# Patient Record
Sex: Male | Born: 1950 | Race: White | Hispanic: No | Marital: Married | Smoking: Former smoker
Health system: Southern US, Community
[De-identification: ages and names within clinical notes are randomized; demographics above are authoritative.]

## PROBLEM LIST (undated history)

## (undated) DIAGNOSIS — K224 Dyskinesia of esophagus: Secondary | ICD-10-CM

## (undated) DIAGNOSIS — N200 Calculus of kidney: Secondary | ICD-10-CM

## (undated) DIAGNOSIS — N400 Enlarged prostate without lower urinary tract symptoms: Secondary | ICD-10-CM

## (undated) DIAGNOSIS — F17200 Nicotine dependence, unspecified, uncomplicated: Secondary | ICD-10-CM

## (undated) DIAGNOSIS — R0989 Other specified symptoms and signs involving the circulatory and respiratory systems: Secondary | ICD-10-CM

## (undated) DIAGNOSIS — Z87442 Personal history of urinary calculi: Secondary | ICD-10-CM

## (undated) DIAGNOSIS — R200 Anesthesia of skin: Secondary | ICD-10-CM

## (undated) DIAGNOSIS — R202 Paresthesia of skin: Secondary | ICD-10-CM

## (undated) DIAGNOSIS — E119 Type 2 diabetes mellitus without complications: Secondary | ICD-10-CM

## (undated) DIAGNOSIS — E7800 Pure hypercholesterolemia, unspecified: Secondary | ICD-10-CM

## (undated) DIAGNOSIS — D649 Anemia, unspecified: Secondary | ICD-10-CM

## (undated) DIAGNOSIS — I10 Essential (primary) hypertension: Secondary | ICD-10-CM

## (undated) DIAGNOSIS — R011 Cardiac murmur, unspecified: Secondary | ICD-10-CM

## (undated) DIAGNOSIS — Z9889 Other specified postprocedural states: Secondary | ICD-10-CM

## (undated) DIAGNOSIS — E785 Hyperlipidemia, unspecified: Secondary | ICD-10-CM

## (undated) DIAGNOSIS — M5126 Other intervertebral disc displacement, lumbar region: Secondary | ICD-10-CM

## (undated) DIAGNOSIS — I350 Nonrheumatic aortic (valve) stenosis: Secondary | ICD-10-CM

## (undated) DIAGNOSIS — N209 Urinary calculus, unspecified: Secondary | ICD-10-CM

## (undated) DIAGNOSIS — R1310 Dysphagia, unspecified: Secondary | ICD-10-CM

## (undated) DIAGNOSIS — I739 Peripheral vascular disease, unspecified: Secondary | ICD-10-CM

## (undated) DIAGNOSIS — C4491 Basal cell carcinoma of skin, unspecified: Secondary | ICD-10-CM

## (undated) DIAGNOSIS — Z8719 Personal history of other diseases of the digestive system: Secondary | ICD-10-CM

## (undated) DIAGNOSIS — I639 Cerebral infarction, unspecified: Secondary | ICD-10-CM

## (undated) DIAGNOSIS — M1990 Unspecified osteoarthritis, unspecified site: Secondary | ICD-10-CM

## (undated) DIAGNOSIS — R42 Dizziness and giddiness: Secondary | ICD-10-CM

## (undated) DIAGNOSIS — K219 Gastro-esophageal reflux disease without esophagitis: Secondary | ICD-10-CM

## (undated) HISTORY — DX: Calculus of kidney: N20.0

## (undated) HISTORY — DX: Nicotine dependence, unspecified, uncomplicated: F17.200

## (undated) HISTORY — DX: Dysphagia, unspecified: R13.10

## (undated) HISTORY — DX: Essential (primary) hypertension: I10

## (undated) HISTORY — DX: Benign prostatic hyperplasia without lower urinary tract symptoms: N40.0

## (undated) HISTORY — DX: Dizziness and giddiness: R42

## (undated) HISTORY — DX: Urinary calculus, unspecified: N20.9

## (undated) HISTORY — DX: Other specified postprocedural states: Z98.890

## (undated) HISTORY — DX: Other specified symptoms and signs involving the circulatory and respiratory systems: R09.89

## (undated) HISTORY — DX: Anemia, unspecified: D64.9

## (undated) HISTORY — DX: Anesthesia of skin: R20.0

## (undated) HISTORY — DX: Cardiac murmur, unspecified: R01.1

## (undated) HISTORY — DX: Type 2 diabetes mellitus without complications: E11.9

## (undated) HISTORY — DX: Dyskinesia of esophagus: K22.4

## (undated) HISTORY — PX: OTHER SURGICAL HISTORY: SHX169

## (undated) HISTORY — PX: CATARACT EXTRACTION W/ INTRAOCULAR LENS  IMPLANT, BILATERAL: SHX1307

## (undated) HISTORY — DX: Pure hypercholesterolemia, unspecified: E78.00

## (undated) HISTORY — PX: UMBILICAL HERNIA REPAIR: SUR1181

## (undated) HISTORY — DX: Paresthesia of skin: R20.2

## (undated) HISTORY — DX: Basal cell carcinoma of skin, unspecified: C44.91

## (undated) HISTORY — PX: ROTATOR CUFF REPAIR: SHX139

## (undated) HISTORY — PX: CYSTOSCOPY: SUR368

## (undated) HISTORY — DX: Hyperlipidemia, unspecified: E78.5

## (undated) HISTORY — PX: BACK SURGERY: SHX140

## (undated) HISTORY — DX: Nonrheumatic aortic (valve) stenosis: I35.0

---

## 1984-05-28 HISTORY — PX: NECK SURGERY: SHX720

## 1999-06-21 ENCOUNTER — Encounter: Payer: Self-pay | Admitting: Family Medicine

## 1999-06-21 ENCOUNTER — Encounter: Admission: RE | Admit: 1999-06-21 | Discharge: 1999-06-21 | Payer: Self-pay | Admitting: Family Medicine

## 2000-05-28 HISTORY — PX: FINGER SURGERY: SHX640

## 2001-05-11 ENCOUNTER — Encounter: Payer: Self-pay | Admitting: Emergency Medicine

## 2001-05-11 ENCOUNTER — Observation Stay (HOSPITAL_COMMUNITY): Admission: EM | Admit: 2001-05-11 | Discharge: 2001-05-12 | Payer: Self-pay | Admitting: Emergency Medicine

## 2001-05-11 ENCOUNTER — Encounter: Payer: Self-pay | Admitting: *Deleted

## 2001-08-22 ENCOUNTER — Ambulatory Visit (HOSPITAL_BASED_OUTPATIENT_CLINIC_OR_DEPARTMENT_OTHER): Admission: RE | Admit: 2001-08-22 | Discharge: 2001-08-22 | Payer: Self-pay | Admitting: *Deleted

## 2001-10-16 ENCOUNTER — Encounter: Payer: Self-pay | Admitting: Family Medicine

## 2001-10-16 ENCOUNTER — Encounter: Admission: RE | Admit: 2001-10-16 | Discharge: 2001-10-16 | Payer: Self-pay | Admitting: Family Medicine

## 2004-12-05 ENCOUNTER — Encounter: Admission: RE | Admit: 2004-12-05 | Discharge: 2004-12-05 | Payer: Self-pay | Admitting: General Surgery

## 2004-12-13 ENCOUNTER — Ambulatory Visit (HOSPITAL_COMMUNITY): Admission: RE | Admit: 2004-12-13 | Discharge: 2004-12-13 | Payer: Self-pay | Admitting: General Surgery

## 2004-12-13 ENCOUNTER — Ambulatory Visit (HOSPITAL_BASED_OUTPATIENT_CLINIC_OR_DEPARTMENT_OTHER): Admission: RE | Admit: 2004-12-13 | Discharge: 2004-12-13 | Payer: Self-pay | Admitting: General Surgery

## 2005-06-14 ENCOUNTER — Encounter: Admission: RE | Admit: 2005-06-14 | Discharge: 2005-06-14 | Payer: Self-pay | Admitting: Family Medicine

## 2006-01-07 ENCOUNTER — Emergency Department (HOSPITAL_COMMUNITY): Admission: EM | Admit: 2006-01-07 | Discharge: 2006-01-07 | Payer: Self-pay | Admitting: Emergency Medicine

## 2007-05-29 DIAGNOSIS — Z9889 Other specified postprocedural states: Secondary | ICD-10-CM

## 2007-05-29 HISTORY — DX: Other specified postprocedural states: Z98.890

## 2008-04-07 ENCOUNTER — Ambulatory Visit (HOSPITAL_COMMUNITY): Admission: RE | Admit: 2008-04-07 | Discharge: 2008-04-07 | Payer: Self-pay | Admitting: Family Medicine

## 2009-10-18 ENCOUNTER — Encounter: Payer: Self-pay | Admitting: Endocrinology

## 2009-11-30 ENCOUNTER — Ambulatory Visit: Payer: Self-pay | Admitting: Endocrinology

## 2009-11-30 DIAGNOSIS — N209 Urinary calculus, unspecified: Secondary | ICD-10-CM

## 2009-11-30 DIAGNOSIS — R0989 Other specified symptoms and signs involving the circulatory and respiratory systems: Secondary | ICD-10-CM | POA: Insufficient documentation

## 2009-11-30 DIAGNOSIS — E119 Type 2 diabetes mellitus without complications: Secondary | ICD-10-CM

## 2009-11-30 DIAGNOSIS — E7800 Pure hypercholesterolemia, unspecified: Secondary | ICD-10-CM | POA: Insufficient documentation

## 2009-11-30 DIAGNOSIS — F17200 Nicotine dependence, unspecified, uncomplicated: Secondary | ICD-10-CM

## 2009-11-30 DIAGNOSIS — I10 Essential (primary) hypertension: Secondary | ICD-10-CM | POA: Insufficient documentation

## 2009-11-30 HISTORY — DX: Other specified symptoms and signs involving the circulatory and respiratory systems: R09.89

## 2009-11-30 HISTORY — DX: Nicotine dependence, unspecified, uncomplicated: F17.200

## 2009-11-30 HISTORY — DX: Urinary calculus, unspecified: N20.9

## 2009-11-30 HISTORY — DX: Pure hypercholesterolemia, unspecified: E78.00

## 2009-11-30 HISTORY — DX: Type 2 diabetes mellitus without complications: E11.9

## 2009-11-30 HISTORY — DX: Essential (primary) hypertension: I10

## 2009-12-16 ENCOUNTER — Ambulatory Visit: Payer: Self-pay | Admitting: Endocrinology

## 2009-12-16 LAB — CONVERTED CEMR LAB: Fructosamine: 331 micromoles/L — ABNORMAL HIGH (ref <285)

## 2010-03-17 ENCOUNTER — Ambulatory Visit: Payer: Self-pay | Admitting: Endocrinology

## 2010-05-28 DIAGNOSIS — I639 Cerebral infarction, unspecified: Secondary | ICD-10-CM

## 2010-05-28 HISTORY — DX: Cerebral infarction, unspecified: I63.9

## 2010-06-16 ENCOUNTER — Ambulatory Visit: Admit: 2010-06-16 | Payer: Self-pay | Admitting: Endocrinology

## 2010-06-27 NOTE — Assessment & Plan Note (Signed)
Summary: 1-2 week follow up-lb   Vital Signs:  Patient profile:   60 year old male Height:      60 inches (152.40 cm) Weight:      167.25 pounds (76.02 kg) BMI:     32.78 O2 Sat:      97 % on Room air Temp:     97.8 degrees F (36.56 degrees C) oral Pulse rate:   78 / minute BP sitting:   130 / 78  (left arm) Cuff size:   regular  Vitals Entered By: Brenton Grills MA (December 16, 2009 8:40 AM)  O2 Flow:  Room air CC: 2 wk F/U/aj Is Patient Diabetic? Yes   CC:  2 wk F/U/aj.  History of Present Illness: he was able to increase the victoza to the maximum amount, although he has some nausea.  he has mild hypoglycemia in the afternoon, if he misses lunch.  he says cbg is higherst in am (mid-100's).  he brings a record of his cbg's which i have reviewed today.  all are in the low to mid-100's.  Current Medications (verified): 1)  Hydrochlorothiazide 25 Mg Tabs (Hydrochlorothiazide) .[name].. 1 Tablet Once Daily 2)  Starlix 120 Mg Tabs (Nateglinide) .[name].. 1 Tablet After Meals 3)  Vytorin 10-80 Mg Tabs (Ezetimibe-Simvastatin) .[name].. 1 Tablet Once Daily 4)  Actos 45 Mg Tabs (Pioglitazone Hcl) .[name].. 1 Tablet Once Daily 5)  Lotrel 10-20 Mg Caps (Amlodipine Besy-Benazepril Hcl) .[name].. 1 Capsule Once Daily 6)  Metformin Hcl 500 Mg Tabs (Metformin Hcl) .... 2 Tablets Two Times A Day 7)  Aspirin 81 Mg Tbec (Aspirin) .[name].. 1 Tablet Once Daily 8)  Victoza 18 Mg/14ml Soln (Liraglutide) .... 1.8 Mg Each Am  Allergies (verified): No Known Drug Allergies  Past History:  Past Medical History: Last updated: 11/30/2009 CAROTID BRUIT, RIGHT (ICD-785.9) SMOKER (ICD-305.1) HYPERCHOLESTEROLEMIA (ICD-272.0) HYPERTENSION (ICD-401.9) URINARY CALCULUS (ICD-592.9) DM (ICD-250.00)  Review of Systems  The patient denies syncope.    Physical Exam  General:  normal appearance.   Skin:  injection sites at anterior abdomen are normal  Additional Exam:  Fructosamine    [H]  331 umol/L  (converts to a1c of  7.2)   Impression & Recommendations:  Problem # 1:  DM (ICD-250.00) Assessment Improved  Other Orders: T-Fructosamine (16109-60454) Est. Patient Level III (09811)  Patient Instructions: 1)  blood tests are being ordered for you today.  please call [phone] to hear your test results. 2)  pending the test results, please continue the same medications for now 3)  check your blood sugar 2 times a day.  vary the time of day when you check, between before the 3 meals, and at bedtime.  also check if you have symptoms of your blood sugar being too high or too low.  please keep a record of the readings and bring it to your next appointment here.  please call us sooner if you are having low blood sugar episodes. 4)  Please schedule a follow-up appointment in 3 months. 5)  (update: i left message on phone-tree:  rx as we discussed)   Preventive Care Screening  Colonoscopy:    Date:  05/28/2006    Results:  normal

## 2010-06-27 NOTE — Assessment & Plan Note (Signed)
Summary: 3 MTH FU---STC   Vital Signs:  Patient profile:   60 year old male Height:      60 inches (152.40 cm) Weight:      177.25 pounds (80.57 kg) BMI:     34.74 O2 Sat:      98 % on Room air Temp:     98.4 degrees F (36.89 degrees C) oral Pulse rate:   75 / minute BP sitting:   118 / 72  (left arm) Cuff size:   regular  Vitals Entered By: Brenton Grills MA (March 17, 2010 8:08 AM)  O2 Flow:  Room air CC: 3 month F/U/aj Is Patient Diabetic? Yes   CC:  3 month F/U/aj.  History of Present Illness: pt says he could not tolerate victoza, even at the lowest dosage, due to nausea.  no cbg record, but states cbg's have increased to high-100's.  Current Medications (verified): 1)  Hydrochlorothiazide 25 Mg Tabs (Hydrochlorothiazide) .[name].. 1 Tablet Once Daily 2)  Starlix 120 Mg Tabs (Nateglinide) .[name].. 1 Tablet After Meals 3)  Vytorin 10-80 Mg Tabs (Ezetimibe-Simvastatin) .[name].. 1 Tablet Once Daily 4)  Actos 45 Mg Tabs (Pioglitazone Hcl) .[name].. 1 Tablet Once Daily 5)  Lotrel 10-20 Mg Caps (Amlodipine Besy-Benazepril Hcl) .[name].. 1 Capsule Once Daily 6)  Metformin Hcl 500 Mg Tabs (Metformin Hcl) .... 2 Tablets Two Times A Day 7)  Aspirin 81 Mg Tbec (Aspirin) .[name].. 1 Tablet Once Daily 8)  Victoza 18 Mg/87ml Soln (Liraglutide) .... 1.8 Mg Each Am  Allergies (verified): 1)  ! Victoza (Liraglutide)  Past History:  Past Medical History: Last updated: 11/30/2009 CAROTID BRUIT, RIGHT (ICD-785.9) SMOKER (ICD-305.1) HYPERCHOLESTEROLEMIA (ICD-272.0) HYPERTENSION (ICD-401.9) URINARY CALCULUS (ICD-592.9) DM (ICD-250.00)  Review of Systems  The patient denies hypoglycemia.    Physical Exam  General:  normal appearance.   Psych:  Alert and cooperative; normal mood and affect; normal attention span and concentration.     Impression & Recommendations:  Problem # 1:  DM (ICD-250.00) needs increased rx  Medications Added to Medication List This Visit: 1)  Januvia 100 Mg Tabs  (Sitagliptin phosphate) .[name].. 1 tab each am 2)  Glimepiride 4 Mg Tabs (Glimepiride) .[name].. 1 tab two times a day  Other Orders: Admin 1st Vaccine (84696) Flu Vaccine 38yrs + (29528) TLB-A1C / Hgb A1C (Glycohemoglobin) (83036-A1C) Est. Patient Level III (41324)  Patient Instructions: 1)  blood tests are being ordered for you today.  please call [phone] to hear your test results. 2)  if the blood test is high, i would advise changing the starlix to prandin, or adding [place].   3)  Please schedule a follow-up appointment in 3 months. 4)  (update: i left message on phone-tree:  add januvia 100 mg each am, and change starlix to amaryl 4 mg two times a day). Prescriptions: JANUVIA 100 MG TABS (SITAGLIPTIN PHOSPHATE) 1 tab each am  #90 x 3   Entered and Authorized by:   Minus Breeding MD   Signed by:   Minus Breeding MD on 03/17/2010   Method used:   Faxed to ...       [facility] (mail-order)             , [place]         Ph: [phone]       Fax: [phone]   RxID:   [id number] GLIMEPIRIDE 4 MG TABS (GLIMEPIRIDE) 1 tab two times a day  #180 x 3   Entered and Authorized by:  Minus Breeding MD   Signed by:   Minus Breeding MD on 03/17/2010   Method used:   Faxed to ...       [facility] (mail-order)             , [place]         Ph: [phone]       Fax: [phone]   RxID:   [id number] JANUVIA 100 MG TABS (SITAGLIPTIN PHOSPHATE) 1 tab each am  #30 x 11   Entered and Authorized by:   Minus Breeding MD   Signed by:   Minus Breeding MD on 03/17/2010   Method used:   Faxed to ...       [facility] (mail-order)             , [place]         Ph: [phone]       Fax: [phone]   RxID:   [id number]    Orders Added: 1)  Admin 1st Vaccine [90471] 2)  Flu Vaccine 27yrs + [90658] 3)  TLB-A1C / Hgb A1C (Glycohemoglobin) [83036-A1C] 4)  Est. Patient Level III [75643]        Flu Vaccine Consent Questions     Do you have a history of severe allergic reactions to this vaccine? no     Any prior history of allergic reactions to egg and/or gelatin? no    Do you have a sensitivity to the preservative Thimersol? no    Do you have a past history of Guillan-Barre Syndrome? no    Do you currently have an acute febrile illness? no    Have you ever had a severe reaction to latex? no    Vaccine information given and explained to patient? yes    Are you currently pregnant? no    Lot Number:[id number]   Exp Date:11/25/2010   Site Given  Left Deltoid IMflu1

## 2010-06-27 NOTE — Assessment & Plan Note (Signed)
Summary: NEW [employer] [facility] PT-DR August Saucer MITCHELL/PC PHY-DIABETES-#-STC   Vital Signs:  Patient profile:   60 year old male Height:      60 inches (152.40 cm) Weight:      171.19 pounds (77.81 kg) BMI:     33.55 O2 Sat:      97 % on Room air Temp:     97.7 degrees F (36.50 degrees C) oral Pulse rate:   69 / minute BP sitting:   138 / 74  (left arm) Cuff size:   regular  Vitals Entered By: Brenton Grills MA (November 30, 2009 8:32 AM)  O2 Flow:  Room air CC: new endo pt Dr. August Saucer Mitchell/aj   CC:  new endo pt Dr. August Saucer Mitchell/aj.  History of Present Illness: pt states 9 years h/o dm.  he is unaware of any chronic complications.  he has never been on insulin.  he takes 3 oral agents.  he did not tolerate bytta (nausea).  he states cbg's are high-100's in am. pt says his diet is "good," and exercise is not good.   symptomatically, pt states few years of slight weight loss, but no chest pain.  no assoc numbness of the feet. 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  Current Medications (verified): 1)  Hydrochlorothiazide 25 Mg Tabs (Hydrochlorothiazide) .[name].. 1 Tablet Once Daily 2)  Starlix 120 Mg Tabs (Nateglinide) .[name].. 1 Tablet After Meals 3)  Vytorin 10-80 Mg Tabs (Ezetimibe-Simvastatin) .[name].. 1 Tablet Once Daily 4)  Actos 45 Mg Tabs (Pioglitazone Hcl) .[name].. 1 Tablet Once Daily 5)  Lotrel 10-20 Mg Caps (Amlodipine Besy-Benazepril Hcl) .[name].. 1 Capsule Once Daily 6)  Metformin Hcl 500 Mg Tabs (Metformin Hcl) .... 2 Tablets Two Times A Day 7)  Aspirin 81 Mg Tbec (Aspirin) .[name].. 1 Tablet Once Daily  Allergies (verified): No Known Drug Allergies  Past History:  Past Medical History: CAROTID BRUIT, RIGHT (ICD-785.9) SMOKER (ICD-305.1) HYPERCHOLESTEROLEMIA (ICD-272.0) HYPERTENSION (ICD-401.9) URINARY CALCULUS (ICD-592.9) DM (ICD-250.00)  Family History: Reviewed history and no changes required. dm:  mother and sister  Social History: Reviewed  history and no changes required. married trucker.  Review of Systems  The patient denies fever and syncope.         denies blurry vision, headache, sob, n/v, urinary frequency, cramps, excessive diaphoresis, memory loss, depression, hypoglycemia, rhinorrhea, and easy bruising.    Physical Exam  General:  normal appearance.   Head:  head: no deformity eyes: no periorbital swelling, no proptosis external nose and ears are normal mouth: no lesion seen Neck:  Supple without thyroid enlargement or tenderness.  Lungs:  Clear to auscultation bilaterally. Normal respiratory effort.  Heart:  Regular rate and rhythm without gallops noted. Normal S1,S2.  there is a soft systolic murmur. Msk:  muscle bulk and strength are grossly normal.  no obvious joint swelling.  gait is normal and steady  Pulses:  dorsalis pedis intact bilat.  there is a slight right carotid bruit.  Extremities:  no deformity.  no ulcer on the feet.  feet are of normal color and temp.  no edema  Neurologic:  cn 2-12 grossly intact.   readily moves all 4's.   sensation is intact to touch on the feet  Skin:  normal texture and temp.  no rash.  not diaphoretic  Cervical Nodes:  No significant adenopathy.  Psych:  Alert and cooperative; normal mood and affect; normal attention span and concentration.  Additional Exam:  outside test results are reviewed:  glucose=217 LDL=196   Impression & Recommendations:  Problem # 1:  DM (ICD-250.00) needs increased rx  Problem # 2:  slight weight loss ? due to byetta  Problem # 3:  CAROTID BRUIT, RIGHT (ICD-785.9) incidentally noted  Medications Added to Medication List This Visit: 1)  Hydrochlorothiazide 25 Mg Tabs (Hydrochlorothiazide) .[name].. 1 tablet once daily 2)  Starlix 120 Mg Tabs (Nateglinide) .[name].. 1 tablet after meals 3)  Vytorin 10-80 Mg Tabs (Ezetimibe-simvastatin) .[name].. 1 tablet once daily 4)  Actos 45 Mg Tabs (Pioglitazone hcl) .[name].. 1 tablet once daily 5)   Lotrel 10-20 Mg Caps (Amlodipine besy-benazepril hcl) .[name].. 1 capsule once daily 6)  Metformin Hcl 500 Mg Tabs (Metformin hcl) .... 2 tablets two times a day 7)  Aspirin 81 Mg Tbec (Aspirin) .[name].. 1 tablet once daily 8)  Victoza 18 Mg/53ml Soln (Liraglutide) .... 1.8 mg each am  Other Orders: V[facility] Clinic (Vascular) Consultation Level IV (16109)  Patient Instructions: 1)  good diet and exercise habits significanly improve the control of your diabetes.  please let me know if you wish to be referred to a dietician.  high blood sugar is very risky to your health.  you should see an eye doctor every year. 2)  controlling your blood pressure and cholesterol drastically reduces the damage diabetes does to your body.  this also applies to quitting smoking.  please discuss these with your doctor.  you should take an aspirin every day, unless you have been advised by a doctor not to. 3)  we will need to take this complex situation in stages 4)  check your blood sugar 2 times a day.  vary the time of day when you check, between before the 3 meals, and at bedtime.  also check if you have symptoms of your blood sugar being too high or too low.  please keep a record of the readings and bring it to your next appointment here.  please call us sooner if you are having low blood sugar episodes. 5)  check ultrasound of the carotid arteries.  you will be called with a day and time for an appointment 6)  continue the 3 diabetes pills you are on, for now. 7)  add victoza once daily:  start ar 0.6 mg each am.  then increase to 1.2, then 1.8 mg each am, as you can.  if you have nausea, you cannot increase any more. 8)  Please schedule a follow-up appointment in 1-2 weeks.

## 2010-10-10 ENCOUNTER — Other Ambulatory Visit: Payer: Self-pay | Admitting: Family Medicine

## 2010-10-10 ENCOUNTER — Ambulatory Visit
Admission: RE | Admit: 2010-10-10 | Discharge: 2010-10-10 | Disposition: A | Discharge status: Home / Self Care | Payer: BC Managed Care – PPO | Source: Ambulatory Visit | Attending: Family Medicine | Admitting: Family Medicine

## 2010-10-10 DIAGNOSIS — R200 Anesthesia of skin: Secondary | ICD-10-CM

## 2010-10-10 DIAGNOSIS — R519 Headache, unspecified: Secondary | ICD-10-CM

## 2010-10-13 NOTE — Op Note (Signed)
[place]. [facility]  Patient:    Luis Lindsey, Luis Lindsey Visit Number: [id number] MRN: [id number]          Service Type: DSU Location: [facility] Attending Physician:  Kendell Bane Dictated by:   Lowell Bouton, M.D. Proc. Date: 08/22/01 Admit Date:  08/22/2001                             Operative Report  PREOPERATIVE DIAGNOSIS:  Status post table saw injury right index finger with nail injury.  POSTOPERATIVE DIAGNOSIS:  Status post table saw injury right index finger.  PROCEDURE:  Ablation of nail with revision of amputation right index finger.  SURGEON:  Lowell Bouton, M.D.  ANESTHESIA:  1/2% Marcaine local with sedation.  FINDINGS:  The patient had necrosis of the nail bed with a nonhealed distal phalanx fracture on the right index finger.  There was no nail growth from the germinal matrix that was remaining.  PROCEDURE:  Under 1/2% Marcaine local anesthesia with the tourniquet on the right arm, the right hand was prepped and draped in the usual fashion.  After elevating the limb, the tourniquet was inflated to 250 mmHg.  The necrotic tissue was debrided with the rongeur and knife.  This left the distal phalanx exposed and any remaining germinal matrix was excised sharply.  The nonhealed portion of the distal phalanx was excised sharply from the pulp tissue.  After removing the end of the bone, the pulp was brought dorsally to close over at the eponychial fold.  The bone was rongeured back to a smooth level leaving the DIP joint intact.  After excising the germinal matrix, the flap was closed over after irrigating with saline using a 4-0 Nylon mattress suture.  Sterile dressings were then applied.  The tourniquet was released.  The patient went to the recovery room awake and stable in good condition. Dictated by:   Lowell Bouton, M.D. Attending Physician:  Kendell Bane DD:  08/22/01 TD:   08/23/01 Job: [id number]

## 2010-10-13 NOTE — Op Note (Signed)
NAME:  [name], [name] NO.:  [id number]   MEDICAL RECORD NO.:  [id number]          PATIENT TYPE:  AMB   LOCATION:  [facility]                         FACILITY:  [facility]   PHYSICIAN:  Sharlet Salina T. Hoxworth, M.D.DATE OF BIRTH:  Jun 11, 1950   DATE OF PROCEDURE:  12/13/2004  DATE OF DISCHARGE:                                 OPERATIVE REPORT   PREOPERATIVE DIAGNOSIS:  Umbilical hernia.   POSTOPERATIVE DIAGNOSIS:  Umbilical hernia   SURGICAL PROCEDURES:  Repair of umbilical hernia with mesh.   SURGEON:  Lorne Skeens. Hoxworth, M.D.   ANESTHESIA:  Laryngeal mask general.   BRIEF HISTORY:  Mr. Cregger is a 60 year old male who presents with a  symptomatic small to moderate-sized umbilical hernia confirmed on exam.  Options were discussed, and we have elected to proceed with repair under  general anesthesia using mesh. The nature of the procedure, indications,  risks of bleeding, infection, and recurrence were discussed and understood.  He is now brought to the operating room for this procedure.   DESCRIPTION OF OPERATION:  The patient was brought to the operating room and  placed in the supine position on the operating table.  Laryngeal mask  general anesthesia was induced. The abdomen was sterilely prepped and  draped. Correct patient and procedure were verified. He received  preoperative IV antibiotics. A curvilinear incision was made just beneath  the umbilicus, and dissection was carried down into the subcutaneous tissue.  The umbilical skin was then dissected off the hernia sac and defect. The  hernia sac was excised. This contained preperitoneal fat that was reduced.  The fascia was cleared back from the defect for about 2.5 cm and all  directions. The defect was discrete and measured about a centimeter in  diameter. This was closed transversely with inverting 0 Prolene sutures. An  onlay patch of Parietex mesh about 5 cm in diameter was used and sutured at  the periphery  with interrupted 2-0 Prolene. The soft tissue was infiltrated  with Marcaine. The subcutaneous tissue was closed with 3-0 Vicryl. The skin  was closed with running subcuticular 4-0 Monocryl and Steri-Strips. Sponge,  needle, and instruments were correct.  Dry sterile dressings were applied,  and the patient was taken to the recovery room in good condition.       BTH/MEDQ  D:  12/13/2004  T:  12/13/2004  Job:  [id number]

## 2010-10-13 NOTE — Op Note (Signed)
[place]. [facility]  Patient:    Luis Lindsey, Luis Lindsey Visit Number: [id number] MRN: [id number]          Service Type: MED Location: 1800 [id number] [id number] Attending Physician:  Devoria Albe Dictated by:   Lowell Bouton, M.D. Proc. Date: 05/11/01 Admit Date:  05/11/2001                             Operative Report  PREOPERATIVE DIAGNOSIS:  Open fracture, right index, middle, and ring fingers with lacerations right thumb and small finger, and extensor tendon injuries, right middle and ring finger.  POSTOPERATIVE DIAGNOSIS:  Open fracture, right index, middle, and ring fingers with lacerations right thumb and small finger, and extensor tendon injuries, right middle and ring finger.  PROCEDURE: 1. Open reduction internal fixation of middle phalanx right ring finger with    repair of extensor tendon. 2. Open reduction internal fixation of right index finger. 3. 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 4. Repair of lacerations right thumb and right small finger nail bed.  SURGEON:  Lowell Bouton, M.D.  ANESTHESIA:  General.  OPERATIVE FINDINGS:  The patient had a radial arm saw injury that extended obliquely across the dorsum of the hand.  There was a severely comminuted intra-articular fracture of the middle phalanx of the middle finger with total destruction of the joint and loss of bone.  The index finger had an oblique fracture through the distal phalanx.  The ring finger had a comminuted fracture of the middle phalanx with a T-chondylar intra-articular component at the DIP joint and extensor tendon laceration.  The small finger had a nail bed injury.  The thumb had a laceration into the nail bed and the pulp.  DESCRIPTION OF PROCEDURE:  Under general anesthesia, with the tourniquet on the right arm, the right hand was prepped and draped in the usual fashion. After elevating the limb the tourniquet was inflated to 250 mmHg.  The  wounds were copiously irrigated with saline.  The index finger was done first, and a 4-5 K-wire was placed through the fracture out the distal phalanx, and then retrograde back across the fracture in the DIP joint.  The extensor mechanism was intact, and the fracture was just distal to it.  The pin was bent over and left protruding from the skin.  The skin was closed with 4-0 nylon.  A longitudinal incision was then made in the midline over the dorsum of the middle phalanx of the ring finger down through the extensor tendon.  The fracture was identified and was debrided.  The ulnar condyle was intact, and attached to the shaft portion of the fracture.  There was a longitudinal split in the shaft which was reduced with two 3-5 K-wires and pinned.  The articular fragment radially was free and was reduced to the articular fragment ulnarly with a 3-5 K-wire transversely.  The radial fragment was not pinned to the shaft.  The pins were bent over and left protruding from the skin when axillary showed good alignment.  A longitudinal 4-5 K-wire was placed to pin the DIP joint straight.  The extensor tendon was then repaired with a 4-0 Mersilene suture on the ring finger.  The wound was closed with 4-0 nylon. The nail bed on the small finger was then repaired by replacing it under the skin flaps, and then closing the skin with 4-0 nylon.  The thumb laceration was repaired  with 4-0 nylon.  The middle finger fracture was then opened longitudinally in the midline over the dorsum of the middle phalanx down to bone.  The bone was completely shattered, and did not appear to be reconstructable.  The soft tissue injury also involved the majority of the distal phalanx.  The articular surface of the distal phalanx was normal, but there was not a reconstructable middle phalanx, so it was felt that revision of the amputation was appropriate.  This was performed by ronguering the bone back at the middle phalanx,  keeping the superficialis insertion.  The amputation was revised sharply, and the tourniquet was released prior to closure to make sure the skin flap was viable.  The neurovascular bundles were cut back below the level of the amputation, and the amputation was closed with 4-0 nylon.  Sterile dressings were applied, followed by a dorsal splint.  The patient tolerated the procedure well, and went to the recovery room awake, stable, and in good condition. Dictated by:   Lowell Bouton, M.D. Attending Physician:  Devoria Albe DD:  05/11/01 TD:  05/11/01 Job: [id number]

## 2011-04-13 ENCOUNTER — Encounter: Payer: Self-pay | Admitting: Endocrinology

## 2011-04-13 ENCOUNTER — Ambulatory Visit (INDEPENDENT_AMBULATORY_CARE_PROVIDER_SITE_OTHER): Payer: BC Managed Care – PPO | Admitting: Endocrinology

## 2011-04-13 DIAGNOSIS — E119 Type 2 diabetes mellitus without complications: Secondary | ICD-10-CM

## 2011-04-13 MED ORDER — LIRAGLUTIDE 18 MG/3ML ~~LOC~~ SOLN: 1.8000 mg | SUBCUTANEOUS | Status: DC

## 2011-04-13 NOTE — Progress Notes (Signed)
  Subjective:    Patient ID: Luis Lindsey, male    DOB: [birth year], 60 y.o.   MRN: [id number]  HPI Pt returns for f/u of DM (1992).  He has never been on insulin.  He says a1c at work was 8.2, approx 5 mos ago.  A few days ago, it was 11.7%.  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  He takes 4 oral meds, but takes less than the prescribed dosage.  .  no cbg record, but states cbg's are sometimes low if he take the amaryl 4 mg bid.  He had nausea with victoza in the past.   Past Medical History  Diagnosis Date  . CAROTID BRUIT, RIGHT 11/30/2009  . HYPERCHOLESTEROLEMIA 11/30/2009  . HYPERTENSION 11/30/2009  . SMOKER 11/30/2009  . URINARY CALCULUS 11/30/2009  . DM 11/30/2009    No past surgical history on file.  History   Social History  . Marital Status: Married    Spouse Name: N/A    Number of Children: N/A  . 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 Smoking status: Current Everyday Smoker  . Smokeless tobacco: Not on file  . Alcohol Use: Not on file  . Drug Use: Not on file  . Sexually Active: Not on file   Other Topics Concern  . Not on file   Social History Narrative  . No narrative on file    No current outpatient prescriptions on file prior to visit.    Allergies  Allergen Reactions  . Codeine   . Liraglutide     REACTION: vomiting    Family History  Problem Relation Age of Onset  . Diabetes Mother   . Diabetes Sister     BP 148/66  Pulse 87  Temp(Src) 98.2 F (36.8 C) (Oral)  Ht 5\' 10"  (1.778 m)  Wt 171 lb (77.565 kg)  BMI 24.54 kg/m2  SpO2 97%  Review of Systems He has lost a few lbs.      Objective:   Physical Exam VITAL SIGNS:  See vs page GENERAL: no distress Pulses: dorsalis pedis intact bilat.   Feet: no deformity.  no ulcer on the feet.  feet are of normal color and temp.  no edema.  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

## 2011-04-13 NOTE — Patient Instructions (Addendum)
Please send or bring [place] a copy of the blood tests done a few days ago.   You should take "victoza" pen, once a day.  The side-effect is nausea, which goes away with time.  To avoid this side-effect, start with the lowest (0.6) setting.  After a few days, increase to 1.2 if you have no nausea.  If you still have little or no nausea, increase to the highest (1.8) setting, and continue that setting.  Here is a discount card.  This medication replaces the "januvia." reduce the glimepiride to once daily. Please come back for a follow-up appointment in 2 weeks. If your a1c was recently 11%, it is unlikely you will be able to control your blood sugar without taking insulin.   we will need to take this complex situation in stages

## 2011-04-17 DIAGNOSIS — Z0279 Encounter for issue of other medical certificate: Secondary | ICD-10-CM

## 2011-04-27 ENCOUNTER — Encounter: Payer: Self-pay | Admitting: Endocrinology

## 2011-04-27 ENCOUNTER — Ambulatory Visit (INDEPENDENT_AMBULATORY_CARE_PROVIDER_SITE_OTHER): Payer: BC Managed Care – PPO | Admitting: Endocrinology

## 2011-04-27 DIAGNOSIS — E119 Type 2 diabetes mellitus without complications: Secondary | ICD-10-CM

## 2011-04-27 MED ORDER — PIOGLITAZONE HCL 45 MG PO TABS: 45.0000 mg | ORAL_TABLET | Freq: Every day | ORAL | Status: DC

## 2011-04-27 MED ORDER — GLIMEPIRIDE 2 MG PO TABS: 2.0000 mg | ORAL_TABLET | Freq: Every day | ORAL | Status: DC

## 2011-04-27 NOTE — Patient Instructions (Addendum)
You should try to increase the "victoza" pen to the highest (1.8) setting, and continue that setting.   reduce the glimepiride to 2 mg once daily. Please come back for a follow-up appointment in 6 weeks.

## 2011-04-27 NOTE — Progress Notes (Signed)
  Subjective:    Patient ID: Luis Lindsey, male    DOB: Jun 15, 1950, 60 y.o.   MRN: [id number]  HPI Pt returns for f/u of type 2 DM (1992).  He has never been on insulin.  We have been in the process of transitioning off the amaryl, if possible.  He has increased the victoza to 1.2 mg qd, so far.  He has not yet been able to increase to 1.8 mg, due to nausea.  he brings a record of his cbg's which i have reviewed today.  It varies from 60-146.  There is no trend throughout the day. Past Medical History  Diagnosis Date  . CAROTID BRUIT, RIGHT 11/30/2009  . HYPERCHOLESTEROLEMIA 11/30/2009  . HYPERTENSION 11/30/2009  . SMOKER 11/30/2009  . URINARY CALCULUS 11/30/2009  . DM 11/30/2009    No past surgical history on file.  History   Social History  . Marital Status: Married    Spouse Name: N/A    Number of Children: N/A  . 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 Smoking status: Current Everyday Smoker  . Smokeless tobacco: Not on file  . Alcohol Use: Not on file  . Drug Use: Not on file  . Sexually Active: Not on file   Other Topics Concern  . Not on file   Social History Narrative  . No narrative on file    Current Outpatient Prescriptions on File Prior to Visit  Medication Sig Dispense Refill  . aspirin 81 MG tablet Take 81 mg by mouth daily.        [name] ezetimibe-simvastatin (VYTORIN) 10-80 MG per tablet Take 1 tablet by mouth daily.        . hydrochlorothiazide (HYDRODIURIL) 25 MG tablet Take 25 mg by mouth daily.        . Liraglutide (VICTOZA) 18 MG/3ML SOLN Inject 0.3 mLs (1.8 mg total) into the skin every morning. And pen needles, 1 daily  6 mL  11  . metFORMIN (GLUCOPHAGE) 500 MG tablet 2 tablets by mouth twice daily         Allergies  Allergen Reactions  . Codeine   . Liraglutide     REACTION: vomiting   Family History  Problem Relation Age of Onset  . Diabetes Mother   . Diabetes Sister    BP 128/72  Pulse 98   Temp(Src) 98 F (36.7 C) (Oral)  Ht 5\' 10"  (1.778 m)  Wt 165 lb 8 oz (75.07 kg)  BMI 23.75 kg/m2  SpO2 98%  Review of Systems Denies loc    Objective:   Physical Exam VITAL SIGNS:  See vs page GENERAL: no distress PSYCH: Alert and oriented x 3.  Does not appear anxious nor depressed.   outside test results are reviewed: A1c=11.5%    Assessment & Plan:  DM.  Although a1c is high, cbg's are slightly overcontrolled

## 2011-05-02 ENCOUNTER — Telehealth: Payer: Self-pay | Admitting: *Deleted

## 2011-05-02 NOTE — Telephone Encounter (Signed)
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 Paperwork placed on MD's desk to review.

## 2011-05-02 NOTE — Telephone Encounter (Signed)
Pt informed of MD's advisement. Pt states that he would like MD to complete UNUM form.

## 2011-05-02 NOTE — Telephone Encounter (Signed)
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   i am happy to do the "unum" form, but that is what the form would say.

## 2011-05-03 DIAGNOSIS — Z0279 Encounter for issue of other medical certificate: Secondary | ICD-10-CM

## 2011-05-03 NOTE — Telephone Encounter (Signed)
done

## 2011-05-07 ENCOUNTER — Telehealth: Payer: Self-pay | Admitting: *Deleted

## 2011-05-07 DIAGNOSIS — E119 Type 2 diabetes mellitus without complications: Secondary | ICD-10-CM

## 2011-05-07 NOTE — Telephone Encounter (Signed)
Pt called requesting a callback asking for letter stating that he can return to work and that his diabetes is being managed and is under controlled.

## 2011-05-08 NOTE — Telephone Encounter (Signed)
Left message for pt to callback office.  

## 2011-05-08 NOTE — Telephone Encounter (Signed)
i ordered.  i'll leave message on phone-tree.

## 2011-05-08 NOTE — Telephone Encounter (Signed)
Pt informed

## 2011-05-08 NOTE — Telephone Encounter (Signed)
Pt would like blood test to check blood sugars. He wants to know if he needs to be fasting for this test.

## 2011-05-08 NOTE — Telephone Encounter (Signed)
If i am to state that your blood sugar is well-controlled, this would need to based on a blood test.  Because your next a1c is not due until feb, there is a shorter-term blood test i would be happy to order for you.  Please let me know.

## 2011-05-09 ENCOUNTER — Other Ambulatory Visit: Payer: BC Managed Care – PPO

## 2011-05-09 DIAGNOSIS — E119 Type 2 diabetes mellitus without complications: Secondary | ICD-10-CM

## 2011-05-11 ENCOUNTER — Telehealth: Payer: Self-pay | Admitting: *Deleted

## 2011-05-11 NOTE — Telephone Encounter (Signed)
Pt is requesting results of last lab test

## 2011-05-11 NOTE — Telephone Encounter (Signed)
Pt informed of MD's advisement. Pt wants to know if he can receive a note that states it is okay for him to return to work.

## 2011-05-11 NOTE — Telephone Encounter (Signed)
This calculates to an a1c of 7.5--good

## 2011-05-14 NOTE — Telephone Encounter (Signed)
Pt informed

## 2011-05-14 NOTE — Telephone Encounter (Signed)
i printed letter 

## 2011-05-18 ENCOUNTER — Ambulatory Visit (INDEPENDENT_AMBULATORY_CARE_PROVIDER_SITE_OTHER): Payer: BC Managed Care – PPO

## 2011-05-18 DIAGNOSIS — I10 Essential (primary) hypertension: Secondary | ICD-10-CM

## 2011-05-18 DIAGNOSIS — IMO0001 Reserved for inherently not codable concepts without codable children: Secondary | ICD-10-CM

## 2011-06-08 ENCOUNTER — Ambulatory Visit: Payer: BC Managed Care – PPO | Admitting: Endocrinology

## 2011-09-24 ENCOUNTER — Encounter: Payer: Self-pay | Admitting: Endocrinology

## 2011-09-24 ENCOUNTER — Other Ambulatory Visit (INDEPENDENT_AMBULATORY_CARE_PROVIDER_SITE_OTHER): Payer: BC Managed Care – PPO

## 2011-09-24 ENCOUNTER — Ambulatory Visit (INDEPENDENT_AMBULATORY_CARE_PROVIDER_SITE_OTHER): Payer: BC Managed Care – PPO | Admitting: Endocrinology

## 2011-09-24 VITALS — BP 132/82 | HR 77 | Temp 97.6°F | Ht 70.0 in | Wt 166.0 lb

## 2011-09-24 DIAGNOSIS — E119 Type 2 diabetes mellitus without complications: Secondary | ICD-10-CM

## 2011-09-24 MED ORDER — METFORMIN HCL 500 MG PO TABS: ORAL_TABLET | ORAL | Status: DC

## 2011-09-24 MED ORDER — LIRAGLUTIDE 18 MG/3ML ~~LOC~~ SOLN: 1.8000 mg | SUBCUTANEOUS | Status: DC

## 2011-09-24 MED ORDER — GLIMEPIRIDE 1 MG PO TABS: 1.0000 mg | ORAL_TABLET | Freq: Every day | ORAL | Status: DC

## 2011-09-24 MED ORDER — GLIMEPIRIDE 1 MG PO TABS: 1.0000 mg | ORAL_TABLET | Freq: Every day | ORAL | Status: AC

## 2011-09-24 MED ORDER — PIOGLITAZONE HCL 45 MG PO TABS: 45.0000 mg | ORAL_TABLET | Freq: Every day | ORAL | Status: DC

## 2011-09-24 NOTE — Patient Instructions (Addendum)
blood tests are being requested for you today.  You will receive a letter with results. reduce the glimepiride to 1 mg once daily. Please come back for a follow-up appointment in 3 months. If today's blood test is slightly high, we'll add another pill called "bromocriptine."  If it is very high, you should take insulin.

## 2011-09-24 NOTE — Progress Notes (Signed)
  Subjective:    Patient ID: Luis Lindsey, male    DOB: [birth year], 61 y.o.   MRN: [id number]  HPI Pt returns for f/u of type 2 DM (dx'ed 1992, complicated by PAD).  He has never been on insulin.  We have been in the process of transitioning off the amaryl, if possible.  He has increased the victoza to 1.8 mg.  no cbg record, but states cbg's are well-controlled.   Past Medical History  Diagnosis Date  . CAROTID BRUIT, RIGHT 11/30/2009  . HYPERCHOLESTEROLEMIA 11/30/2009  . HYPERTENSION 11/30/2009  . SMOKER 11/30/2009  . URINARY CALCULUS 11/30/2009  . DM 11/30/2009    No past surgical history on file.  History   Social History  . Marital Status: Married    Spouse Name: N/A    Number of Children: N/A  . 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 Smoking status: Current Everyday Smoker  . Smokeless tobacco: Not on file  . Alcohol Use: Not on file  . Drug Use: Not on file  . Sexually Active: Not on file   Other Topics Concern  . Not on file   Social History Narrative  . No narrative on file    Current Outpatient Prescriptions on File Prior to Visit  Medication Sig Dispense Refill  . amLODipine-benazepril (LOTREL) 10-40 MG per capsule Take 1 capsule by mouth daily.        [name] aspirin 81 MG tablet Take 81 mg by mouth daily.        [name] ezetimibe-simvastatin (VYTORIN) 10-80 MG per tablet Take 1 tablet by mouth daily.        . hydrochlorothiazide (HYDRODIURIL) 25 MG tablet Take 25 mg by mouth daily.        . Liraglutide (VICTOZA) 18 MG/3ML SOLN Inject 0.3 mLs (1.8 mg total) into the skin every morning. And pen needles, 1 daily  18 mL  3  . metFORMIN (GLUCOPHAGE) 500 MG tablet 2 tablets by mouth twice daily  360 tablet  3  . pioglitazone (ACTOS) 45 MG tablet Take 1 tablet (45 mg total) by mouth daily.  90 tablet  3    Allergies  Allergen Reactions  . Codeine   . Liraglutide     REACTION: vomiting    Family History  Problem  Relation Age of Onset  . Diabetes Mother   . Diabetes Sister     BP 132/82  Pulse 77  Temp(Src) 97.6 F (36.4 C) (Oral)  Ht 5\' 10"  (1.778 m)  Wt 166 lb (75.297 kg)  BMI 23.82 kg/m2  SpO2 99%    Review of Systems He has intermittent mild hypoglycemia.    Objective:   Physical Exam VITAL SIGNS:  See vs page GENERAL: no distress Pulses: dorsalis pedis intact bilat.   Feet: no deformity.  no ulcer on the feet.  feet are of normal color and temp.  no edema.  There is bilateral onychomycosis Neuro: sensation is intact to touch on the feet     Lab Results  Component Value Date   HGBA1C 7.1* 09/24/2011      Assessment & Plan:  DM.  He is ready to continue the transition off the amaryl

## 2011-09-25 ENCOUNTER — Telehealth: Payer: Self-pay | Admitting: *Deleted

## 2011-09-25 NOTE — Telephone Encounter (Signed)
Called pt to inform of lab results, no answer/unable to leave message (letter also mailed to pt).  

## 2011-09-26 NOTE — Telephone Encounter (Signed)
No answer-- unable to leave message.

## 2011-09-27 NOTE — Telephone Encounter (Signed)
Message that number is temporary out of service-letter has been mailed to pt, closing phone note.

## 2011-12-24 ENCOUNTER — Ambulatory Visit: Payer: BC Managed Care – PPO | Admitting: Endocrinology

## 2011-12-24 DIAGNOSIS — Z0289 Encounter for other administrative examinations: Secondary | ICD-10-CM

## 2012-05-28 HISTORY — PX: FACIAL COSMETIC SURGERY: SHX629

## 2012-08-01 ENCOUNTER — Ambulatory Visit: Payer: BC Managed Care – PPO | Admitting: Endocrinology

## 2012-08-07 ENCOUNTER — Encounter: Payer: Self-pay | Admitting: Endocrinology

## 2012-08-07 ENCOUNTER — Ambulatory Visit (INDEPENDENT_AMBULATORY_CARE_PROVIDER_SITE_OTHER): Payer: BC Managed Care – PPO | Admitting: Endocrinology

## 2012-08-07 ENCOUNTER — Other Ambulatory Visit: Payer: Self-pay

## 2012-08-07 VITALS — BP 136/80 | HR 80 | Wt 185.0 lb

## 2012-08-07 DIAGNOSIS — E119 Type 2 diabetes mellitus without complications: Secondary | ICD-10-CM

## 2012-08-07 LAB — MICROALBUMIN / CREATININE URINE RATIO
Creatinine,U: 108.2 mg/dL
Microalb Creat Ratio: 6.4 mg/g (ref 0.0–30.0)
Microalb, Ur: 6.9 mg/dL — ABNORMAL HIGH (ref 0.0–1.9)

## 2012-08-07 MED ORDER — SITAGLIPTIN PHOSPHATE 100 MG PO TABS: 100.0000 mg | ORAL_TABLET | Freq: Every day | ORAL | Status: DC

## 2012-08-07 MED ORDER — PIOGLITAZONE HCL 45 MG PO TABS: 45.0000 mg | ORAL_TABLET | Freq: Every day | ORAL | Status: DC

## 2012-08-07 NOTE — Patient Instructions (Addendum)
Please change the victoza to "[place]." i have sent a prescription to your pharmacy.   Please come back for a follow-up appointment in 3 months.   If today's blood test is slightly high, we'll add another pill called "bromocriptine."    check your blood sugar once a day.  vary the time of day when you check, between before the 3 meals, and at bedtime.  also check if you have symptoms of your blood sugar being too high or too low.  please keep a record of the readings and bring it to your next appointment here.  please call us sooner if your blood sugar goes below 70, or if you have a lot of readings over 200.

## 2012-08-07 NOTE — Progress Notes (Signed)
  Subjective:    Patient ID: Luis Lindsey, male    DOB: [birth year], 62 y.o.   MRN: [id number]  HPI Pt returns for f/u of type 2 DM (dx'ed 1992, complicated by PAD; he has never been on insulin).  We have been in the process of transitioning off the amaryl, if possible.  His insurance is denying the victoza.  no cbg record, but states cbg's are well-controlled.  pt states he feels well in general. Past Medical History  Diagnosis Date  . CAROTID BRUIT, RIGHT 11/30/2009  . HYPERCHOLESTEROLEMIA 11/30/2009  . HYPERTENSION 11/30/2009  . SMOKER 11/30/2009  . URINARY CALCULUS 11/30/2009  . DM 11/30/2009    No past surgical history on file.  History   Social History  . Marital Status: Married    Spouse Name: N/A    Number of Children: N/A  . 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 Smoking status: Current Every Day Smoker  . Smokeless tobacco: Not on file  . Alcohol Use: Not on file  . Drug Use: Not on file  . Sexually Active: Not on file   Other Topics Concern  . Not on file   Social History Narrative  . No narrative on file    Current Outpatient Prescriptions on File Prior to Visit  Medication Sig Dispense Refill  . amLODipine-benazepril (LOTREL) 10-40 MG per capsule Take 1 capsule by mouth daily.        [name] aspirin 81 MG tablet Take 81 mg by mouth daily.        [name] ezetimibe-simvastatin (VYTORIN) 10-80 MG per tablet Take 1 tablet by mouth daily.        [name] glimepiride (AMARYL) 1 MG tablet Take 1 tablet (1 mg total) by mouth daily before breakfast.  90 tablet  3  . hydrochlorothiazide (HYDRODIURIL) 25 MG tablet Take 25 mg by mouth daily.        . metFORMIN (GLUCOPHAGE) 500 MG tablet 2 tablets by mouth twice daily  360 tablet  3   No current facility-administered medications on file prior to visit.    Allergies  Allergen Reactions  . Codeine   . Liraglutide     REACTION: vomiting    Family History  Problem Relation Age of  Onset  . Diabetes Mother   . Diabetes Sister     BP 136/80  Pulse 80  Wt 185 lb (83.915 kg)  BMI 26.54 kg/m2  SpO2 99%  Review of Systems denies hypoglycemia    Objective:   Physical Exam Pulses: dorsalis pedis intact bilat.   Feet: no deformity.  no ulcer on the feet.  feet are of normal color and temp.  no edema Neuro: sensation is intact to touch on the feet.       Assessment & Plan:  DM: control will probably worsen with the change from victoza to [place], so he will probably need a new med.

## 2012-08-08 ENCOUNTER — Other Ambulatory Visit: Payer: Self-pay | Admitting: Endocrinology

## 2012-08-08 MED ORDER — BROMOCRIPTINE MESYLATE 2.5 MG PO TABS: 2.5000 mg | ORAL_TABLET | Freq: Every day | ORAL | Status: DC

## 2012-08-14 ENCOUNTER — Telehealth: Payer: Self-pay | Admitting: Endocrinology

## 2012-08-14 NOTE — Telephone Encounter (Signed)
Called Luis Lindsey and LVM that his blood sugar is high. His HgbA1C was 8.2. Per Dr Everardo All, please make the changes you discussed at your appt and also add 'bromocriptine,' to help with blood sugar. It has possible side effects of nausea and dizziness. These go away with time. To help avoid these side effects you can take it at bedtim and when you begin taking it, just take 1/2 pill for the first week and then move to a full pill the next week. If Luis Lindsey has any questions or problems to give [place] a call back.

## 2012-08-14 NOTE — Telephone Encounter (Signed)
[phone] - please call patient with lab results / Sherri S.

## 2012-09-10 ENCOUNTER — Telehealth: Payer: Self-pay | Admitting: Endocrinology

## 2012-09-10 NOTE — Telephone Encounter (Signed)
meds not working, blood sugar still running really high. CB# [phone]

## 2012-09-10 NOTE — Telephone Encounter (Signed)
i would be happy to prescribe an additional pill, but i believe it would take insulin to control your blood sugar.  Please let me know.

## 2012-09-11 MED ORDER — CANAGLIFLOZIN 100 MG PO TABS: 1.0000 | ORAL_TABLET | Freq: Every day | ORAL | Status: DC

## 2012-09-11 NOTE — Telephone Encounter (Signed)
Pt advised.

## 2012-09-11 NOTE — Telephone Encounter (Signed)
Pt states he would like additional pill sent to the pharmacy

## 2012-09-11 NOTE — Telephone Encounter (Signed)
i have sent a prescription to your pharmacy, for "invokana."  Please drink plenty of fluids while taking this.

## 2012-10-11 ENCOUNTER — Other Ambulatory Visit: Payer: Self-pay | Admitting: Endocrinology

## 2012-10-13 ENCOUNTER — Other Ambulatory Visit: Payer: Self-pay | Admitting: *Deleted

## 2012-10-13 MED ORDER — GLIMEPIRIDE 1 MG PO TABS
1.0000 mg | ORAL_TABLET | Freq: Every day | ORAL | Status: DC
Start: 2012-10-13 — End: 2012-10-17

## 2012-10-13 MED ORDER — METFORMIN HCL 500 MG PO TABS: ORAL_TABLET | ORAL | Status: DC

## 2012-10-17 ENCOUNTER — Ambulatory Visit (INDEPENDENT_AMBULATORY_CARE_PROVIDER_SITE_OTHER): Payer: BC Managed Care – PPO | Admitting: Endocrinology

## 2012-10-17 ENCOUNTER — Encounter: Payer: Self-pay | Admitting: Endocrinology

## 2012-10-17 VITALS — BP 124/80 | HR 78 | Ht 70.0 in | Wt 174.0 lb

## 2012-10-17 DIAGNOSIS — E119 Type 2 diabetes mellitus without complications: Secondary | ICD-10-CM

## 2012-10-17 MED ORDER — NATEGLINIDE 120 MG PO TABS: 120.0000 mg | ORAL_TABLET | Freq: Three times a day (TID) | ORAL | Status: DC

## 2012-10-17 MED ORDER — CANAGLIFLOZIN 100 MG PO TABS: 1.0000 | ORAL_TABLET | Freq: Every day | ORAL | Status: DC

## 2012-10-17 NOTE — Progress Notes (Signed)
  Subjective:    Patient ID: Luis Lindsey, male    DOB: [birth year], 62 y.o.   MRN: [id number]  HPI Pt returns for f/u of type 2 DM (dx'ed 1992, he has little if any neuropathy of the lower extremities; he has associated PAD; he has never been on insulin; insurance denied victoza; he says parlodel did not work).  We have been in the process of transitioning off the amaryl, if possible.  no cbg record, but states cbg's vary from 100-150.  pt states he feels well in general.   Past Medical History  Diagnosis Date  . CAROTID BRUIT, RIGHT 11/30/2009  . HYPERCHOLESTEROLEMIA 11/30/2009  . HYPERTENSION 11/30/2009  . SMOKER 11/30/2009  . URINARY CALCULUS 11/30/2009  . DM 11/30/2009    No past surgical history on file.  History   Social History  . Marital Status: Married    Spouse Name: N/A    Number of Children: N/A  . 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 Smoking status: Current Every Day Smoker  . Smokeless tobacco: Not on file  . Alcohol Use: Not on file  . Drug Use: Not on file  . Sexually Active: Not on file   Other Topics Concern  . Not on file   Social History Narrative  . No narrative on file    Current Outpatient Prescriptions on File Prior to Visit  Medication Sig Dispense Refill  . amLODipine-benazepril (LOTREL) 10-40 MG per capsule Take 1 capsule by mouth daily.        [name] aspirin 81 MG tablet Take 81 mg by mouth daily.        [name] doxycycline (VIBRAMYCIN) 100 MG capsule       . ezetimibe-simvastatin (VYTORIN) 10-80 MG per tablet Take 1 tablet by mouth daily.        . hydrochlorothiazide (HYDRODIURIL) 25 MG tablet Take 25 mg by mouth daily.        . metFORMIN (GLUCOPHAGE) 500 MG tablet 2 tablets by mouth twice daily  360 tablet  3  . pioglitazone (ACTOS) 45 MG tablet Take 1 tablet (45 mg total) by mouth daily.  90 tablet  3  . sitaGLIPtin (JANUVIA) 100 MG tablet Take 1 tablet (100 mg total) by mouth daily.  90 tablet  11    No current facility-administered medications on file prior to visit.    Allergies  Allergen Reactions  . Codeine   . Liraglutide     REACTION: vomiting    Family History  Problem Relation Age of Onset  . Diabetes Mother   . Diabetes Sister     BP 124/80  Pulse 78  Ht 5\' 10"  (1.778 m)  Wt 174 lb (78.926 kg)  BMI 24.97 kg/m2  SpO2 98%  Review of Systems denies hypoglycemia and weight change.     Objective:   Physical Exam VITAL SIGNS:  See vs page GENERAL: no distress     Assessment & Plan:  DM: from among the possible oral agent regimens, he should change from glimepiride to starlix, to minimize hypoglycemia.

## 2012-10-17 NOTE — Patient Instructions (Addendum)
Please change the glimepiride to "nateglinide."  i have sent a prescription to your pharmacy.   Please come back for a follow-up appointment in 2 months.   check your blood sugar once a day.  vary the time of day when you check, between before the 3 meals, and at bedtime.  also check if you have symptoms of your blood sugar being too high or too low.  please keep a record of the readings and bring it to your next appointment here.  please call us sooner if your blood sugar goes below 70, or if you have a lot of readings over 200.

## 2012-11-07 ENCOUNTER — Other Ambulatory Visit: Payer: Self-pay | Admitting: Family Medicine

## 2012-11-07 DIAGNOSIS — R209 Unspecified disturbances of skin sensation: Secondary | ICD-10-CM

## 2012-11-14 ENCOUNTER — Ambulatory Visit
Admission: RE | Admit: 2012-11-14 | Discharge: 2012-11-14 | Disposition: A | Discharge status: Home / Self Care | Payer: BC Managed Care – PPO | Source: Ambulatory Visit | Attending: Family Medicine | Admitting: Family Medicine

## 2012-11-14 DIAGNOSIS — R209 Unspecified disturbances of skin sensation: Secondary | ICD-10-CM

## 2012-12-19 ENCOUNTER — Ambulatory Visit (INDEPENDENT_AMBULATORY_CARE_PROVIDER_SITE_OTHER): Payer: BC Managed Care – PPO | Admitting: Endocrinology

## 2012-12-19 ENCOUNTER — Ambulatory Visit: Payer: BC Managed Care – PPO | Admitting: Endocrinology

## 2012-12-19 ENCOUNTER — Encounter: Payer: Self-pay | Admitting: Endocrinology

## 2012-12-19 VITALS — BP 124/80 | HR 80 | Ht 70.0 in | Wt 174.0 lb

## 2012-12-19 DIAGNOSIS — E119 Type 2 diabetes mellitus without complications: Secondary | ICD-10-CM

## 2012-12-19 NOTE — Patient Instructions (Signed)
blood tests are being requested for you today.  We'll contact you with results.  check your blood sugar once a day.  vary the time of day when you check, between before the 3 meals, and at bedtime.  also check if you have symptoms of your blood sugar being too high or too low.  please keep a record of the readings and bring it to your next appointment here.  please call us sooner if your blood sugar goes below 70, or if you have a lot of readings over 200.

## 2012-12-19 NOTE — Progress Notes (Signed)
  Subjective:    Patient ID: Luis Lindsey, male    DOB: Feb 12, 1951, 62 y.o.   MRN: [id number]  HPI Pt returns for f/u of type 2 DM (dx'ed 1992, he has little if any neuropathy of the lower extremities; he has associated PAD; he has never been on insulin; insurance denied victoza; he says parlodel did not work).  no cbg record, but states cbg's are in the mid to high-100's.  He ran out of his meds 3 days ago.  Past Medical History  Diagnosis Date  . CAROTID BRUIT, RIGHT 11/30/2009  . HYPERCHOLESTEROLEMIA 11/30/2009  . HYPERTENSION 11/30/2009  . SMOKER 11/30/2009  . URINARY CALCULUS 11/30/2009  . DM 11/30/2009    No past surgical history on file.  History   Social History  . Marital Status: Married    Spouse Name: N/A    Number of Children: N/A  . 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 Smoking status: Current Every Day Smoker  . Smokeless tobacco: Not on file  . Alcohol Use: Not on file  . Drug Use: Not on file  . Sexually Active: Not on file   Other Topics Concern  . Not on file   Social History Narrative  . No narrative on file    Current Outpatient Prescriptions on File Prior to Visit  Medication Sig Dispense Refill  . amLODipine-benazepril (LOTREL) 10-40 MG per capsule Take 1 capsule by mouth daily.        [name] aspirin 81 MG tablet Take 81 mg by mouth daily.        . Canagliflozin (INVOKANA) 100 MG TABS Take 1 tablet (100 mg total) by mouth daily.  90 tablet  3  . doxycycline (VIBRAMYCIN) 100 MG capsule       . ezetimibe-simvastatin (VYTORIN) 10-80 MG per tablet Take 1 tablet by mouth daily.        . hydrochlorothiazide (HYDRODIURIL) 25 MG tablet Take 25 mg by mouth daily.        . metFORMIN (GLUCOPHAGE) 500 MG tablet 2 tablets by mouth twice daily  360 tablet  3  . nateglinide (STARLIX) 120 MG tablet Take 1 tablet (120 mg total) by mouth 3 (three) times daily before meals.  270 tablet  11  . pioglitazone (ACTOS) 45 MG  tablet Take 1 tablet (45 mg total) by mouth daily.  90 tablet  3  . sitaGLIPtin (JANUVIA) 100 MG tablet Take 1 tablet (100 mg total) by mouth daily.  90 tablet  11   No current facility-administered medications on file prior to visit.    Allergies  Allergen Reactions  . Codeine   . Liraglutide     REACTION: vomiting    Family History  Problem Relation Age of Onset  . Diabetes Mother   . Diabetes Sister     BP 124/80  Pulse 80  Ht 5\' 10"  (1.778 m)  Wt 174 lb (78.926 kg)  BMI 24.97 kg/m2  SpO2 97%  Review of Systems denies hypoglycemia and weight change    Objective:   Physical Exam VITAL SIGNS:  See vs page GENERAL: no distress Ext: no edema.       Assessment & Plan:  DM: uncertain control.  there are nine oral agents categories available for type 2 diabetes.  This regimen gives the best risk-benefit ratio.

## 2012-12-25 ENCOUNTER — Encounter: Payer: Self-pay | Admitting: Neurology

## 2012-12-25 DIAGNOSIS — N200 Calculus of kidney: Secondary | ICD-10-CM | POA: Insufficient documentation

## 2012-12-26 ENCOUNTER — Encounter: Payer: Self-pay | Admitting: Endocrinology

## 2012-12-26 ENCOUNTER — Encounter: Payer: Self-pay | Admitting: Neurology

## 2012-12-26 ENCOUNTER — Ambulatory Visit (INDEPENDENT_AMBULATORY_CARE_PROVIDER_SITE_OTHER): Payer: BC Managed Care – PPO | Admitting: Neurology

## 2012-12-26 VITALS — BP 135/71 | HR 80 | Ht 70.5 in | Wt 174.0 lb

## 2012-12-26 DIAGNOSIS — I10 Essential (primary) hypertension: Secondary | ICD-10-CM

## 2012-12-26 DIAGNOSIS — H9319 Tinnitus, unspecified ear: Secondary | ICD-10-CM

## 2012-12-26 DIAGNOSIS — G459 Transient cerebral ischemic attack, unspecified: Secondary | ICD-10-CM

## 2012-12-26 DIAGNOSIS — H9311 Tinnitus, right ear: Secondary | ICD-10-CM

## 2012-12-26 MED ORDER — ASPIRIN 325 MG PO TABS: 325.0000 mg | ORAL_TABLET | Freq: Every day | ORAL | Status: DC

## 2012-12-26 NOTE — Progress Notes (Addendum)
[facility]  Provider:  Dr Hosie Poisson Referring Provider: Benita Stabile, MD Primary Care Physician:  Benita Stabile, MD  No chief complaint on file. -pain  -split the midline  HPI:  Luis Lindsey is a 62 y.o. male here as a referral from Dr. Clovis Riley for recurrent, transient episodes of R hemisensory loss  He notes these symptoms started around 1 year ago. Initially were occurring less than once a week, now occurring 2-3 times per week maximum. Typically will last about 2-3 minutes. They involve the whole body from 400 down to toes. Described as a sensation of pins and needles like careful falling asleep. Denies any triggering factors or any relieving factors, reports the episodes this resolve on their own. He does note some sensation of lightheadedness and difficulty controlling his right arm, associated symptoms last more than a few minutes. Does occasionally feel off balance with these, which she reports is due to lack of sensation on his right side. He does feel his episodes do occur more often when he is active, or showering. Denies any prior history of this. Denies any visual changes with these episodes, no blurring of vision no graying of vision no narrowing of visual fields. Does note a sensation of ringing in his ears at baseline, which worsens when an episode is coming. Within a minute of the rating worsening he typically will start to develop any numbness. The numbness typically comes on gradually and builds. Was told by his primary care physician that he suffered a stroke around 3 years ago based on CT imaging. At that time he denies having any stroke symptoms. No prior history of symptoms similar to these episodes. Currently taking aspirin 81 mg and Vytorin.  Has multiple stroke risk factors include hypertension, diabetes and hypercholesteremia. Has strong paternal history of strokes, and multiple family members passing away from stroke complications. No  history of headaches no migraine history no family history of migraines or dementia. 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 Otherwise reports he is in good health. Does have a greater than 40 year history of tobacco use. Denies any alcohol or drug use.    Review of Systems: Out of a complete 14 system review, the patient complains of only the following symptoms, and all other reviewed systems are negative. Positive for headache numbness weakness burning in ears  History   Social History  . Marital Status: Married    Spouse Name: N/A    Number of Children: N/A  . 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 Smoking status: Current Every Day Smoker    Types: Cigarettes  . Smokeless tobacco: Never Used     Comment: Patient smokes 2-3 cigarettes daily.  . Alcohol Use: No  . Drug Use: No  . Sexually Active: Not on file   Other Topics Concern  . Not on file   Social History Narrative   Patient has a history of smoking cigarettes 2-3 per day. Patient drinks 2-3 cups of caffeine daily.           Family History  Problem Relation Age of Onset  . Diabetes Mother   . Diabetes Sister   . Stroke Father   . Heart disease Father   . Diabetes Mother   . Heart disease Mother     Past Medical History  Diagnosis Date  .  CAROTID BRUIT, RIGHT 11/30/2009  . HYPERCHOLESTEROLEMIA 11/30/2009  . HYPERTENSION 11/30/2009  . SMOKER 11/30/2009  . URINARY CALCULUS 11/30/2009  . DM 11/30/2009  . Kidney stones     No past surgical history on file.  Current Outpatient Prescriptions  Medication Sig Dispense Refill  . amLODipine-benazepril (LOTREL) 10-40 MG per capsule Take 1 capsule by mouth daily.        [name] aspirin 81 MG tablet Take 81 mg by mouth daily.        . Canagliflozin (INVOKANA) 100 MG TABS Take 1 tablet (100 mg total) by mouth daily.  90 tablet  3  . doxycycline  (VIBRAMYCIN) 100 MG capsule       . etodolac (LODINE) 400 MG tablet Take 400 mg by mouth daily.      [name] ezetimibe-simvastatin (VYTORIN) 10-80 MG per tablet Take 1 tablet by mouth daily.        [name] glimepiride (AMARYL) 1 MG tablet Take 1 mg by mouth daily.      . hydrochlorothiazide (HYDRODIURIL) 25 MG tablet Take 25 mg by mouth daily.        . metFORMIN (GLUCOPHAGE) 500 MG tablet 2 tablets by mouth twice daily  360 tablet  3  . nateglinide (STARLIX) 120 MG tablet Take 1 tablet (120 mg total) by mouth 3 (three) times daily before meals.  270 tablet  11  . pioglitazone (ACTOS) 45 MG tablet Take 1 tablet (45 mg total) by mouth daily.  90 tablet  3  . sitaGLIPtin (JANUVIA) 100 MG tablet Take 1 tablet (100 mg total) by mouth daily.  90 tablet  11   No current facility-administered medications for this visit.    Allergies as of 12/26/2012 - Review Complete 12/25/2012  Allergen Reaction Noted  . Codeine  04/13/2011  . Liraglutide  03/17/2010    Vitals: There were no vitals taken for this visit. Last Weight:  Wt Readings from Last 1 Encounters:  12/19/12 174 lb (78.926 kg)   Last Height:   Ht Readings from Last 1 Encounters:  12/19/12 5\' 10"  (1.778 m)     Physical exam: Exam: Gen: NAD, conversant Eyes: anicteric sclerae, moist conjunctivae HENT: Atraumati Neck: Trachea midline; supple,  Lungs: CTA, no wheezing, rales, rhonic                          CV: RRR, systolic ejection murmur (pt reports is chronic) Abdomen: Soft, non-tender;  Extremities: No peripheral edema  Skin: Normal temperature, no rash,  Psych: Appropriate affect, pleasant  Neuro: MS: AA&Ox3, appropriately interactive, normal affect   Attention: WORLD backwards  Speech: fluent w/o paraphasic error  Memory: good recent and remote recall  CN: PERRL, EOMI no nystagmus, VFF to FC bilat,  no ptosis, sensation intact to LT V1-V3 bilat, face symmetric, no weakness, hearing grossly intact, palate elevates  symmetrically, shoulder shrug 5/5 bilat,  tongue protrudes midline, no fasiculations noted.  Motor: normal bulk and tone Strength: 5/5  In all extremities  Coord: rapid alternating and point-to-point (FNF, HTS) movements intact.  Reflexes: symmetrical, bilat downgoing toes  Sens: LT intact in all extremities  Gait: posture, stance, stride and arm-swing normal. Tandem gait intact. Able to walk on heels and toes. Romberg absent.   Assessment:  After physical and neurologic examination, review of laboratory studies, imaging, neurophysiology testing and pre-existing records, assessment will be reviewed on the problem list.  Plan:  Treatment plan and additional workup will be reviewed under  Problem List.  Luis Lindsey is a pleasant 62 year old gentleman who presents for initial evaluation of recurrent transient right hemisensory changes that have been ongoing for the past year. He does have a history of prior stroke found on head imaging, and multiple stroke risk factors including hypertension hypercholesterolemia and diabetes. The symptoms have been progressing in frequency now occurring a few times per week. His physical exam is unremarkable. Based on the clinical history and risk factors the main concern for these episodes are transient ischemic attacks. Based on imaging showing prior strokes in the thalamic region there is also the possibility of post-thalamic stroke pain syndrome though this would be atypical. Cannot also rule out seizure episodes, but again this is less likely based on the clinical history and findings.  TIA (transient ischemic attack) - Plan: MR MRA HEAD WO CONTRAST, aspirin (BAYER ASPIRIN) 325 MG tablet  Tinnitus, right  Unspecified essential hypertension  -will order MRA of the brain to check vessel patency. Can consider TCDs in the future if warranted -increase ASA to 325mg , if not tolerating would consider switch to Plavix -continue vytorin. BP, lipid and DM  optimization per primary care team -would consider EEG in the future -follow up in 3 months  A total of 45 minutes was spent in with this patient. Over half this time was spent on counseling patient on the diagnosis and different therapeutic options available.  We discussed the potential diagnosis, the different ways to work it up and possible treatment options. All questions were completely answered.    I have read the note, and I agree with the clinical assessment and plan.  Lesly Dukes

## 2012-12-26 NOTE — Patient Instructions (Signed)
Overall you are doing fairly well but I do want to suggest a few things today:   Remember to drink plenty of fluid, eat healthy meals and do not skip any meals. Try to eat protein with a every meal and eat a healthy snack such as fruit or nuts in between meals. Try to keep a regular sleep-wake schedule and try to exercise daily, particularly in the form of walking, 20-30 minutes a day, if you can.   As far as your medications are concerned, I would like to suggest you increase your Aspirin to a 325mg  capsule. Please take this with food and notify us of any GI disturbance  As far as diagnostic testing:  I would like to order a MRA of the brain to check the blood flow  I would like to see you back in 3 months, sooner if we need to. Please call us with any interim questions, concerns, problems, updates or refill requests.   Please also call us for any test results so we can go over those with you on the phone.  My clinical assistant and will answer any of your questions and relay your messages to me and also relay most of my messages to you.   Our phone number is [phone]. We also have an after hours call service for urgent matters and there is a physician on-call for urgent questions. For any emergencies you know to call 911 or go to the nearest emergency room

## 2012-12-28 ENCOUNTER — Other Ambulatory Visit: Payer: Self-pay | Admitting: Endocrinology

## 2012-12-28 MED ORDER — CANAGLIFLOZIN 300 MG PO TABS: 1.0000 | ORAL_TABLET | Freq: Every day | ORAL | Status: DC

## 2013-01-01 ENCOUNTER — Ambulatory Visit (INDEPENDENT_AMBULATORY_CARE_PROVIDER_SITE_OTHER): Payer: BC Managed Care – PPO

## 2013-01-01 ENCOUNTER — Other Ambulatory Visit: Payer: Self-pay | Admitting: Neurology

## 2013-01-01 DIAGNOSIS — G459 Transient cerebral ischemic attack, unspecified: Secondary | ICD-10-CM

## 2013-01-01 MED ORDER — GADOPENTETATE DIMEGLUMINE 469.01 MG/ML IV SOLN: 17.0000 mL | Freq: Once | INTRAVENOUS | Status: AC | PRN

## 2013-01-02 ENCOUNTER — Other Ambulatory Visit: Payer: Self-pay | Admitting: Neurology

## 2013-01-02 DIAGNOSIS — G459 Transient cerebral ischemic attack, unspecified: Secondary | ICD-10-CM

## 2013-01-08 ENCOUNTER — Telehealth: Payer: Self-pay | Admitting: Neurology

## 2013-01-09 ENCOUNTER — Other Ambulatory Visit: Payer: Self-pay | Admitting: Neurology

## 2013-01-09 MED ORDER — CLOPIDOGREL BISULFATE 75 MG PO TABS: 75.0000 mg | ORAL_TABLET | Freq: Every day | ORAL | Status: DC

## 2013-01-09 NOTE — Telephone Encounter (Signed)
Patient is calling wanting to know what his MRI are. Please advise.

## 2013-01-09 NOTE — Telephone Encounter (Signed)
I called him and discussed the results. He will start Plavix 75mg  in addition to the aspirin. Thanks.

## 2013-01-22 ENCOUNTER — Telehealth: Payer: Self-pay | Admitting: Endocrinology

## 2013-01-22 NOTE — Telephone Encounter (Signed)
Pt called requesting samples of Invokana 300mg  until his rx comes in from [facility]. Called pt and lvm telling him that the samples will be up front for him to pick up.

## 2013-02-02 ENCOUNTER — Telehealth: Payer: Self-pay | Admitting: Neurology

## 2013-02-02 ENCOUNTER — Telehealth: Payer: Self-pay | Admitting: Endocrinology

## 2013-02-02 MED ORDER — CLOPIDOGREL BISULFATE 75 MG PO TABS: 75.0000 mg | ORAL_TABLET | Freq: Every day | ORAL | Status: DC

## 2013-02-02 NOTE — Telephone Encounter (Signed)
Rx updated to 90 days and resent.

## 2013-02-04 ENCOUNTER — Other Ambulatory Visit: Payer: Self-pay

## 2013-02-04 MED ORDER — METFORMIN HCL 500 MG PO TABS: ORAL_TABLET | ORAL | Status: DC

## 2013-02-10 ENCOUNTER — Telehealth: Payer: Self-pay | Admitting: Endocrinology

## 2013-02-10 MED ORDER — METFORMIN HCL 500 MG PO TABS: ORAL_TABLET | ORAL | Status: DC

## 2013-02-10 NOTE — Telephone Encounter (Signed)
Pt advised rx for metformin sent to [facility]

## 2013-03-30 ENCOUNTER — Ambulatory Visit (INDEPENDENT_AMBULATORY_CARE_PROVIDER_SITE_OTHER): Payer: BC Managed Care – PPO | Admitting: Neurology

## 2013-03-30 ENCOUNTER — Encounter: Payer: Self-pay | Admitting: Neurology

## 2013-03-30 VITALS — BP 130/79 | HR 86 | Ht 70.0 in | Wt 180.0 lb

## 2013-03-30 DIAGNOSIS — R202 Paresthesia of skin: Secondary | ICD-10-CM

## 2013-03-30 DIAGNOSIS — R209 Unspecified disturbances of skin sensation: Secondary | ICD-10-CM

## 2013-03-30 NOTE — Progress Notes (Signed)
[facility]   Provider:  Dr Hosie Poisson Referring Provider: Lupe Carney, MD Primary Care Physician:  Lupe Carney, MD  CC: paresthesias  HPI:  Luis Lindsey is a 62 y.o. male here as a follow up  Overall stable since last visit. He had a brain MRA and MRI showing old infarcts, including one in the left thalamic region. 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 Started on Plavix and aspirin 25 mg daily. Tolerating well. Feels his episodes of paresthesias are decreased in frequency, occurring less than one time per week period lasting less than 2 minutes each. Continues to be right-sided pins and needles type sensation. No altered consciousness weakness visual changes there is episodes. Had rotator cuff sugery on Oct 23, scheduled to start PT on Thursday. BP and DM under good control.   Last visit 12/2012: He notes these symptoms started around 1 year ago. Initially were occurring less than once a week, now occurring 2-3 times per week maximum. Typically will last about 2-3 minutes. They involve the whole body from 400 down to toes. Described as a sensation of pins and needles like careful falling asleep. Denies any triggering factors or any relieving factors, reports the episodes this resolve on their own. He does note some sensation of lightheadedness and difficulty controlling his right arm, associated symptoms last more than a few minutes. Does occasionally feel off balance with these, which she reports is due to lack of sensation on his right side. He does feel his episodes do occur more often when he is active, or showering. Denies any prior history of this. Denies any visual changes with these episodes, no blurring of vision no graying of vision no narrowing of visual fields. Does note a sensation of ringing in his ears at baseline, which worsens when an episode is coming. Within a minute of the rating worsening he typically will start  to develop any numbness. The numbness typically comes on gradually and builds. Was told by his primary care physician that he suffered a stroke around 3 years ago based on CT imaging. At that time he denies having any stroke symptoms. No prior history of symptoms similar to these episodes. Currently taking aspirin 81 mg and Vytorin.  Has multiple stroke risk factors include hypertension, diabetes and hypercholesteremia. Has strong paternal history of strokes, and multiple family members passing away from stroke complications. No history of headaches no migraine history no family history of migraines or dementia. 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 Otherwise reports he is in good health. Does have a greater than 40 year history of tobacco use. Denies any alcohol or drug use.    Concerns/Questions:Review of Systems: Out of a complete 14 system review, the patient complains of only the following symptoms, and all other reviewed systems are negative. Denies any positive review of systems  History   Social History  . Marital Status: Married    Spouse Name: Luis Lindsey     Number of Children: 3  . 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 Smoking status: Former Smoker    Types: Cigarettes  . Smokeless tobacco: Never Used     Comment: Quit [id number]  . Alcohol Use: No  . Drug Use: No  . Sexual Activity: Not on file   Other Topics Concern  . Not on file  Social History Narrative   . Patient drinks 2-3 cups of caffeine daily.    Patient lives at home with his wife Luis Lindsey.    Patient works at [employer].    Education. 12 th grade   Right handed                Family History  Problem Relation Age of Onset  . Diabetes Sister   . Stroke Father   . Heart disease Father   . Diabetes Mother   . Heart disease Mother     Past Medical History  Diagnosis Date  . CAROTID  BRUIT, RIGHT 11/30/2009  . HYPERCHOLESTEROLEMIA 11/30/2009  . HYPERTENSION 11/30/2009  . SMOKER 11/30/2009  . URINARY CALCULUS 11/30/2009  . DM 11/30/2009  . Kidney stones     Past Surgical History  Procedure Laterality Date  . Finger surgery  2002  . Neck surgery  1986  . Facial cosmetic surgery  2014  . Rotator cuff repair Left     Current Outpatient Prescriptions  Medication Sig Dispense Refill  . amLODipine-benazepril (LOTREL) 10-40 MG per capsule Take 1 capsule by mouth daily.        [name] aspirin (BAYER ASPIRIN) 325 MG tablet Take 1 tablet (325 mg total) by mouth daily.  30 tablet  6  . Canagliflozin (INVOKANA) 300 MG TABS Take 1 tablet by mouth daily.  90 tablet  3  . CHANTIX CONTINUING MONTH PAK 1 MG tablet Take 1 mg by mouth daily.      . clopidogrel (PLAVIX) 75 MG tablet Take 1 tablet (75 mg total) by mouth daily.  90 tablet  3  . ezetimibe-simvastatin (VYTORIN) 10-80 MG per tablet Take 1 tablet by mouth daily.        . hydrochlorothiazide (HYDRODIURIL) 25 MG tablet Take 25 mg by mouth daily.        . metFORMIN (GLUCOPHAGE) 500 MG tablet 2 tablets by mouth twice daily  360 tablet  3  . nateglinide (STARLIX) 120 MG tablet Take 1 tablet (120 mg total) by mouth 3 (three) times daily before meals.  270 tablet  11  . oxyCODONE-acetaminophen (PERCOCET/ROXICET) 5-325 MG per tablet 325 tablets as needed.      . pioglitazone (ACTOS) 45 MG tablet Take 1 tablet (45 mg total) by mouth daily.  90 tablet  3  . promethazine (PHENERGAN) 25 MG tablet Take 25 mg by mouth daily.      . sitaGLIPtin (JANUVIA) 100 MG tablet Take 1 tablet (100 mg total) by mouth daily.  90 tablet  11   No current facility-administered medications for this visit.    Allergies as of 03/30/2013 - Review Complete 03/30/2013  Allergen Reaction Noted  . Codeine  04/13/2011  . Liraglutide  03/17/2010    Vitals: BP 130/79  Pulse 86  Ht 5\' 10"  (1.778 m)  Wt 180 lb (81.647 kg)  BMI 25.83 kg/m2 Last Weight:  Wt Readings  from Last 1 Encounters:  03/30/13 180 lb (81.647 kg)   Last Height:   Ht Readings from Last 1 Encounters:  03/30/13 5\' 10"  (1.778 m)     Physical exam:  Exam:  Gen: NAD, conversant  Eyes: anicteric sclerae, moist conjunctivae  HENT: Atraumati  Neck: Trachea midline; supple,  Lungs: CTA, no wheezing, rales, rhonic  CV: RRR, systolic ejection murmur (pt reports is chronic)  Abdomen: Soft, non-tender;  Extremities: No peripheral edema  Skin: Normal temperature, no rash,  Psych: Appropriate affect, pleasant   Neuro:  MS:  AA&Ox3, appropriately interactive, normal affect   Speech: fluent w/o paraphasic error   Memory: good recent and remote recall   CN:  PERRL, EOMI no nystagmus, VFF to FC bilat, no ptosis, sensation intact to LT V1-V3 bilat, face symmetric, no weakness, hearing grossly intact, palate elevates symmetrically, shoulder shrug 5/5 bilat,  tongue protrudes midline, no fasiculations noted.  Motor: normal bulk and tone  Strength:  5/5 In all extremities  Coord: rapid alternating and point-to-point (FNF, HTS) movements intact.  Reflexes: symmetrical, bilat downgoing toes  Sens: LT intact in all extremities  Gait: posture, stance, stride and arm-swing normal. Tandem gait intact. Able to walk on heels and toes. Romberg absent.    Assessment: After physical and neurologic examination, review of laboratory studies, imaging, neurophysiology testing and pre-existing records, assessment will be reviewed on the problem list.   Plan: Treatment plan and additional workup will be reviewed under Problem List.   1)Paresthesias  Mr. Tapp is a pleasant 62 year old gentleman who presents for follow up of recurrent transient right hemisensory changes that have been ongoing for the past year. He does have a history of prior stroke found on head imaging, and multiple stroke risk factors including hypertension hypercholesterolemia and diabetes. After last visit he was started on dual  antiplatelet therapy of ASA 325mg  and Plavix 75mg . Reprts symptoms continue but have decreased in frequency. His physical exam is unremarkable. Differential of these episodes continues to be TIA vs post Thalamic pain syndrome vs possible seizure. As symptoms are improving patient does not wish to go ahead with EEG at this time. Will continue ASA, discontinue plavix and continue good control of stroke risk factors.

## 2013-03-30 NOTE — Patient Instructions (Signed)
Overall you are doing fairly well but I do want to suggest a few things today:   Remember to drink plenty of fluid, eat healthy meals and do not skip any meals. Try to eat protein with a every meal and eat a healthy snack such as fruit or nuts in between meals. Try to keep a regular sleep-wake schedule and try to exercise daily, particularly in the form of walking, 20-30 minutes a day, if you can.   As far as your medications are concerned, I would like to suggest stopping the Plavix 75mg  daily. Continue to take the Aspirin 325mg  daily.   I would like to see you back in 12 months, sooner if we need to. Please call us with any interim questions, concerns, problems, updates or refill requests.   My clinical assistant and will answer any of your questions and relay your messages to me and also relay most of my messages to you.   Our phone number is [phone]. We also have an after hours call service for urgent matters and there is a physician on-call for urgent questions. For any emergencies you know to call 911 or go to the nearest emergency room

## 2013-04-02 ENCOUNTER — Other Ambulatory Visit: Payer: Self-pay

## 2013-08-01 DIAGNOSIS — Z9889 Other specified postprocedural states: Secondary | ICD-10-CM | POA: Insufficient documentation

## 2013-08-01 DIAGNOSIS — R011 Cardiac murmur, unspecified: Secondary | ICD-10-CM | POA: Insufficient documentation

## 2013-08-23 ENCOUNTER — Telehealth: Payer: Self-pay | Admitting: Endocrinology

## 2013-08-24 ENCOUNTER — Ambulatory Visit (INDEPENDENT_AMBULATORY_CARE_PROVIDER_SITE_OTHER): Payer: BC Managed Care – PPO | Admitting: Endocrinology

## 2013-08-24 ENCOUNTER — Encounter: Payer: Self-pay | Admitting: Endocrinology

## 2013-08-24 VITALS — BP 118/70 | HR 84 | Temp 98.5°F | Ht 70.0 in | Wt 177.0 lb

## 2013-08-24 DIAGNOSIS — E119 Type 2 diabetes mellitus without complications: Secondary | ICD-10-CM

## 2013-08-24 DIAGNOSIS — E1149 Type 2 diabetes mellitus with other diabetic neurological complication: Secondary | ICD-10-CM

## 2013-08-24 LAB — MICROALBUMIN / CREATININE URINE RATIO
Creatinine,U: 100.9 mg/dL
MICROALB UR: 6.8 mg/dL — AB (ref 0.0–1.9)
Microalb Creat Ratio: 6.7 mg/g (ref 0.0–30.0)

## 2013-08-24 LAB — HEMOGLOBIN A1C: Hgb A1c MFr Bld: 10.8 % — ABNORMAL HIGH (ref 4.6–6.5)

## 2013-08-24 MED ORDER — CANAGLIFLOZIN 300 MG PO TABS: 1.0000 | ORAL_TABLET | Freq: Every day | ORAL | Status: DC

## 2013-08-24 NOTE — Patient Instructions (Addendum)
blood tests are being requested for you today.  We'll contact you with results.  If it is slightly high, we can add "acarbose" or "welchol."   If it is significantly high, you should take insulin.   check your blood sugar once a day.  vary the time of day when you check, between before the 3 meals, and at bedtime.  also check if you have symptoms of your blood sugar being too high or too low.  please keep a record of the readings and bring it to your next appointment here.  please call us sooner if your blood sugar goes below 70, or if you have a lot of readings over 200.

## 2013-08-24 NOTE — Telephone Encounter (Signed)
Please refill x 1 Ov is due  

## 2013-08-24 NOTE — Progress Notes (Signed)
Subjective:    Patient ID: Luis Lindsey, male    DOB: [birth year], 63 y.o.   MRN: [id number]  HPI Pt returns for f/u of type 2 DM (dx'ed 1992, he has mild neuropathy of the lower extremities; he has associated PAD; he has never been on insulin; insurance denied victoza; he takes 5 oral meds; he says parlodel did not work).  no cbg record, but states cbg's are in the mid to high-100's.  He tolerates meds well.   Past Medical History  Diagnosis Date  . CAROTID BRUIT, RIGHT 11/30/2009  . HYPERCHOLESTEROLEMIA 11/30/2009  . HYPERTENSION 11/30/2009  . SMOKER 11/30/2009  . URINARY CALCULUS 11/30/2009  . DM 11/30/2009  . Kidney stones   . BPH (benign prostatic hyperplasia)   . Heart murmur   . BCC (basal cell carcinoma)   . Right carotid bruit   . History of Holter monitoring 2009    Past Surgical History  Procedure Laterality Date  . Finger surgery  2002  . Neck surgery  1986  . Facial cosmetic surgery  2014  . Rotator cuff repair Left   . Cystoscopy    . Umbilical hernia repair      History   Social History  . Marital Status: Married    Spouse Name: [name]     Number of Children: 3  . Years of Education: 11   Occupational History  . Truck Geophysicist/field seismologist    Social History Main Topics  . Smoking status: Former Smoker -- 40 years    Types: Cigarettes    Quit date: 01/01/2013  . Smokeless tobacco: Never Used     Comment: Quit [phone]  . Alcohol Use: No  . Drug Use: No  . Sexual Activity: Not on file   Other Topics Concern  . Not on file   Social History Narrative   . Patient drinks 2-3 cups of caffeine daily.    Patient lives at home with his wife [name].    Patient works at [employer].    Education. 12 th grade   Right handed                Current Outpatient Prescriptions on File Prior to Visit  Medication Sig Dispense Refill  . amLODipine-benazepril (LOTREL) 10-40 MG per capsule Take 1 capsule by mouth daily.        [name] aspirin (BAYER ASPIRIN) 325 MG tablet Take 1 tablet (325  mg total) by mouth daily.  30 tablet  6  . CHANTIX CONTINUING MONTH PAK 1 MG tablet Take 1 mg by mouth daily.      . clopidogrel (PLAVIX) 75 MG tablet Take 1 tablet (75 mg total) by mouth daily.  90 tablet  3  . hydrochlorothiazide (HYDRODIURIL) 25 MG tablet Take 25 mg by mouth daily.        [name] JANUVIA 100 MG tablet TAKE 1 TABLET DAILY  90 tablet  0  . metFORMIN (GLUCOPHAGE) 500 MG tablet 2 tablets by mouth twice daily  360 tablet  3  . nateglinide (STARLIX) 120 MG tablet Take 1 tablet (120 mg total) by mouth 3 (three) times daily before meals.  270 tablet  11  . oxyCODONE-acetaminophen (PERCOCET/ROXICET) 5-325 MG per tablet 325 tablets as needed.      . pioglitazone (ACTOS) 45 MG tablet TAKE 1 TABLET (45 MG TOTAL) DAILY  90 tablet  0  . promethazine (PHENERGAN) 25 MG tablet Take 25 mg by mouth daily.       No  current facility-administered medications on file prior to visit.    Allergies  Allergen Reactions  . Codeine   . Liraglutide     REACTION: vomiting    Family History  Problem Relation Age of Onset  . Diabetes Sister   . Diabetes Mellitus I Sister   . Stroke Father   . Heart disease Father   . CVA Father   . Coronary artery disease Father   . Diabetes Mother   . Heart disease Mother   . Diabetes Mellitus I Mother   . Coronary artery disease Mother   . Coronary artery disease Brother   . Coronary artery disease Brother     BP 118/70  Pulse 84  Temp(Src) 98.5 F (36.9 C) (Oral)  Ht 5\' 10"  (1.778 m)  Wt 177 lb (80.287 kg)  BMI 25.40 kg/m2  SpO2 98%  Review of Systems He denies hypoglycemia and weight change.    Objective:   Physical Exam VITAL SIGNS:  See vs page GENERAL: no distress  Lab Results  Component Value Date   HGBA1C 10.8* 08/24/2013      Assessment & Plan:  Type 2 DM: her has failed oral meds.  He needs insulin. PAD: in this setting, he should avoid hypoglycemia.

## 2013-08-31 ENCOUNTER — Telehealth: Payer: Self-pay | Admitting: Neurology

## 2013-08-31 NOTE — Telephone Encounter (Signed)
Patient's wife calling--states patient's rt. side is numb--please call.

## 2013-08-31 NOTE — Telephone Encounter (Signed)
Please see if he can come for a follow up appointment on 4/14. Thanks

## 2013-08-31 NOTE — Telephone Encounter (Signed)
Patient has been scheduled /confirmed with wife 09/08/13

## 2013-08-31 NOTE — Telephone Encounter (Signed)
Spoke with wife and she said that patient started having right side numbness a month ago, is taking his medicine according to directions

## 2013-09-08 ENCOUNTER — Encounter: Payer: Self-pay | Admitting: Neurology

## 2013-09-08 ENCOUNTER — Ambulatory Visit (INDEPENDENT_AMBULATORY_CARE_PROVIDER_SITE_OTHER): Payer: BC Managed Care – PPO | Admitting: Neurology

## 2013-09-08 VITALS — BP 105/63 | HR 73 | Ht 70.5 in | Wt 179.0 lb

## 2013-09-08 DIAGNOSIS — R4182 Altered mental status, unspecified: Secondary | ICD-10-CM

## 2013-09-08 DIAGNOSIS — I639 Cerebral infarction, unspecified: Secondary | ICD-10-CM

## 2013-09-08 DIAGNOSIS — R202 Paresthesia of skin: Secondary | ICD-10-CM

## 2013-09-08 DIAGNOSIS — I6350 Cerebral infarction due to unspecified occlusion or stenosis of unspecified cerebral artery: Secondary | ICD-10-CM

## 2013-09-08 DIAGNOSIS — R209 Unspecified disturbances of skin sensation: Secondary | ICD-10-CM

## 2013-09-08 NOTE — Progress Notes (Signed)
[facility]   Provider:  Dr Janann Colonel Referring Provider: Donnie Coffin, MD Primary Care Physician:  Donnie Coffin, MD  CC: paresthesias  HPI:  Luis Lindsey is a 63 y.o. male here as a follow up with last visit 03/2013. At that time he was doing well and his Plavix was discontinued as his symptoms were resolving. Returns today with increased frequency of right sided paresthesias. Recent Hemoglobin A1c shows elevated level of 10.8. Notes around 1 month ago developed numbness on the right side, is occuring more frequently, typically occuring in the morning when he is taking a shower. Currently doing it 6-8 times within a few hour span. Comes and goes, typically 2-3 days a week. Sensation described as a pins and needles type sensation from head down to toes. During the episodes he also notes some weakness on the right side. Wife notes that she will try to talk to him during the event and he will not respond, notes he eventually snaps out of it.    Initial visit 12/2012: He notes these symptoms started around 1 year ago. Initially were occurring less than once a week, now occurring 2-3 times per week maximum. Typically will last about 2-3 minutes. They involve the whole body from 400 down to toes. Described as a sensation of pins and needles like careful falling asleep. Denies any triggering factors or any relieving factors, reports the episodes this resolve on their own. He does note some sensation of lightheadedness and difficulty controlling his right arm, associated symptoms last more than a few minutes. Does occasionally feel off balance with these, which she reports is due to lack of sensation on his right side. He does feel his episodes do occur more often when he is active, or showering. Denies any prior history of this. Denies any visual changes with these episodes, no blurring of vision no graying of vision no narrowing of visual fields. Does note a sensation of ringing in  his ears at baseline, which worsens when an episode is coming. Within a minute of the rating worsening he typically will start to develop any numbness. The numbness typically comes on gradually and builds. Was told by his primary care physician that he suffered a stroke around 3 years ago based on CT imaging. At that time he denies having any stroke symptoms. No prior history of symptoms similar to these episodes. Currently taking aspirin 81 mg and Vytorin.  Has multiple stroke risk factors include hypertension, diabetes and hypercholesteremia. Has strong paternal history of strokes, and multiple family members passing away from stroke complications. No history of headaches no migraine history no family history of migraines or dementia. 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 Otherwise reports he is in good health. Does have a greater than 40 year history of tobacco use. Denies any alcohol or drug use.    Concerns/Questions:Review of Systems: Out of a complete 14 system review, the patient complains of only the following symptoms, and all other reviewed systems are negative. + headaches  History   Social History  . Marital Status: Married    Spouse Name: [name]     Number of Children: 3  . Years of Education: 11   Occupational History  . Truck Geophysicist/field seismologist    Social History Main Topics  . Smoking status: Former Smoker -- 40 years    Types: Cigarettes    Quit date: 01/01/2013  . Smokeless tobacco: Never  Used     Comment: Quit 12-2012  . Alcohol Use: No  . Drug Use: No  . Sexual Activity: Not on file   Other Topics Concern  . Not on file   Social History Narrative   . Patient drinks 2-3 cups of caffeine daily.    Patient lives at home with his wife [name].    Patient works at [employer].    Education. 12 th grade   Right handed                Family History  Problem Relation Age of Onset  . Diabetes Sister   .  Diabetes Mellitus I Sister   . Stroke Father   . Heart disease Father   . CVA Father   . Coronary artery disease Father   . Diabetes Mother   . Heart disease Mother   . Diabetes Mellitus I Mother   . Coronary artery disease Mother   . Coronary artery disease Brother   . Coronary artery disease Brother     Past Medical History  Diagnosis Date  . CAROTID BRUIT, RIGHT 11/30/2009  . HYPERCHOLESTEROLEMIA 11/30/2009  . HYPERTENSION 11/30/2009  . SMOKER 11/30/2009  . URINARY CALCULUS 11/30/2009  . DM 11/30/2009  . Kidney stones   . BPH (benign prostatic hyperplasia)   . Heart murmur   . BCC (basal cell carcinoma)   . Right carotid bruit   . History of Holter monitoring 2009    Past Surgical History  Procedure Laterality Date  . Finger surgery  2002  . Neck surgery  1986  . Facial cosmetic surgery  2014  . Rotator cuff repair Left   . Cystoscopy    . Umbilical hernia repair      Current Outpatient Prescriptions  Medication Sig Dispense Refill  . amLODipine-benazepril (LOTREL) 10-40 MG per capsule Take 1 capsule by mouth daily.        [name] aspirin (BAYER ASPIRIN) 325 MG tablet Take 1 tablet (325 mg total) by mouth daily.  30 tablet  6  . Canagliflozin (INVOKANA) 300 MG TABS Take 1 tablet (300 mg total) by mouth daily.  90 tablet  3  . CHANTIX CONTINUING MONTH PAK 1 MG tablet Take 1 mg by mouth daily.      . hydrochlorothiazide (HYDRODIURIL) 25 MG tablet Take 25 mg by mouth daily.        [name] JANUVIA 100 MG tablet TAKE 1 TABLET DAILY  90 tablet  0  . metFORMIN (GLUCOPHAGE) 500 MG tablet 2 tablets by mouth twice daily  360 tablet  3  . nateglinide (STARLIX) 120 MG tablet Take 1 tablet (120 mg total) by mouth 3 (three) times daily before meals.  270 tablet  11  . oxyCODONE-acetaminophen (PERCOCET/ROXICET) 5-325 MG per tablet 325 tablets as needed.      . pioglitazone (ACTOS) 45 MG tablet TAKE 1 TABLET (45 MG TOTAL) DAILY  90 tablet  0  . promethazine (PHENERGAN) 25 MG tablet Take 25 mg by mouth  daily.      . rosuvastatin (CRESTOR) 20 MG tablet Take 20 mg by mouth daily.       No current facility-administered medications for this visit.    Allergies as of 09/08/2013 - Review Complete 09/08/2013  Allergen Reaction Noted  . Codeine  04/13/2011  . Liraglutide  03/17/2010    Vitals: BP 105/63  Pulse 73  Ht 5' 10.5" (1.791 m)  Wt 179 lb (81.194 kg)  BMI 25.31 kg/m2 Last Weight:  Wt Readings from Last 1 Encounters:  09/08/13 179 lb (81.194 kg)   Last Height:   Ht Readings from Last 1 Encounters:  09/08/13 5' 10.5" (1.791 m)     Physical exam:  Exam:  Gen: NAD, conversant  Eyes: anicteric sclerae, moist conjunctivae  HENT: Atraumati  Neck: Trachea midline; supple,  Lungs: CTA, no wheezing, rales, rhonic  CV: RRR, systolic ejection murmur (pt reports is chronic)  Abdomen: Soft, non-tender;  Extremities: No peripheral edema  Skin: Normal temperature, no rash,  Psych: Appropriate affect, pleasant   Neuro:  MS: AA&Ox3, appropriately interactive, normal affect   Speech: fluent w/o paraphasic error   Memory: good recent and remote recall   CN:  PERRL, EOMI no nystagmus, VFF to FC bilat, no ptosis, sensation intact to LT V1-V3 bilat, face symmetric, no weakness, hearing grossly intact, palate elevates symmetrically, shoulder shrug 5/5 bilat,  tongue protrudes midline, no fasiculations noted.   Motor: normal bulk and tone  Strength:  5/5 In all extremities   Coord: rapid alternating and point-to-point (FNF, HTS) movements intact.  Reflexes: symmetrical, bilat downgoing toes  Sens: mild decreased LT bilateral LE  Gait: posture, stance, stride and arm-swing normal. Tandem gait intact. Able to walk on heels and toes. Romberg absent.    Assessment: After physical and neurologic examination, review of laboratory studies, imaging, neurophysiology testing and pre-existing records, assessment will be reviewed on the problem list.   Plan: Treatment plan and  additional workup will be reviewed under Problem List.   1)Paresthesias 2)CVA 3)Peripheral neuropathy 4)DM  Mr. Ackert is a pleasant 63 year old gentleman who presents for follow up of recurrent transient right hemisensory changes that have been ongoing for the past year. He does have a history of prior stroke found on head imaging, and multiple stroke risk factors including hypertension hypercholesterolemia and diabetes. After last visit he was started on dual antiplatelet therapy of ASA 325mg  and Plavix 75mg . Returns today with concerns that his symptoms are increasing in frequency and severity. Wife notes questionable episodes of confusion or "zoning out". Patient has known history of L ICA stenosis. Concern is for recurrent TIAs. Would also consider partial seizures based on recurrent stereotypical nature. Will check carotid doppler and EEG. Continue on ASA 325mg  and Crestor 20mg  for now. Will follow up once workup completed.    Jim Like, [facility] Phone: [phone] Fax: [phone]

## 2013-09-08 NOTE — Patient Instructions (Addendum)
Overall you are doing fairly well but I do want to suggest a few things today:   Remember to drink plenty of fluid, eat healthy meals and do not skip any meals. Try to eat protein with a every meal and eat a healthy snack such as fruit or nuts in between meals. Try to keep a regular sleep-wake schedule and try to exercise daily, particularly in the form of walking, 20-30 minutes a day, if you can.   As far as your medications are concerned, I would like to suggest you continue on the aspirin and crestor daily  As far as diagnostic testing:  1)Please schedule an EEG and Carotid Ultrasound when you check out  I will notify you of the results of your tests and next steps. Please call us with any interim questions, concerns, problems, updates or refill requests.   My clinical assistant and will answer any of your questions and relay your messages to me and also relay most of my messages to you.   Our phone number is [phone]. We also have an after hours call service for urgent matters and there is a physician on-call for urgent questions. For any emergencies you know to call 911 or go to the nearest emergency room

## 2013-09-18 ENCOUNTER — Ambulatory Visit (INDEPENDENT_AMBULATORY_CARE_PROVIDER_SITE_OTHER): Payer: BC Managed Care – PPO

## 2013-09-18 DIAGNOSIS — R209 Unspecified disturbances of skin sensation: Secondary | ICD-10-CM

## 2013-09-18 DIAGNOSIS — R4182 Altered mental status, unspecified: Secondary | ICD-10-CM

## 2013-09-18 DIAGNOSIS — R202 Paresthesia of skin: Secondary | ICD-10-CM

## 2013-09-21 ENCOUNTER — Ambulatory Visit (INDEPENDENT_AMBULATORY_CARE_PROVIDER_SITE_OTHER): Payer: BC Managed Care – PPO | Admitting: Radiology

## 2013-09-21 DIAGNOSIS — R202 Paresthesia of skin: Secondary | ICD-10-CM

## 2013-09-21 DIAGNOSIS — R4182 Altered mental status, unspecified: Secondary | ICD-10-CM

## 2013-09-21 NOTE — Procedures (Signed)
      Luis Lindsey is a 63 year old patient with a history of numbness on the right side. He also reports some weakness on the right side as well intermittently. His wife has noted that he is not responding normally during the events which last only a few moments. He is being evaluated for these episodes.  This is a routine EEG. No skull defects are noted. Medications include Lotrel, aspirin, Invkana, hydrochlorothiazide, Januvia, metformin, Starlix, Actos, and Crestor.  EEG classification: Dysrhythmia grade 2 left greater than right temporal  Description of the recording: The background rhythms of this recording consists of a fairly well modulated medium amplitude alpha rhythm of 9 Hz that is reactive to eye opening and closure. As the record progresses, intermittent dysrhythmic theta activity is seen emanating from the temporal regions, but is more prominent on the left than the right. Photic stimulation is performed, and results in a bilateral and symmetric photic driving response. Hyperventilation was not performed. At no time during the recording does there appear to be evidence of actual spike or spike wave discharges, and no electrographic seizures were recorded. EKG monitor shows no evidence of cardiac rhythm abnormalities with a heart rate of 66.  Impression: This is an abnormal EEG recording secondary to dysrhythmic theta activity emanating from the left greater than right temporal region. This study would suggest a lowered seizure threshold, but electrographic seizures were not recorded. Clinical correlation is required.

## 2013-11-17 ENCOUNTER — Other Ambulatory Visit: Payer: Self-pay | Admitting: Endocrinology

## 2013-12-23 ENCOUNTER — Telehealth: Payer: Self-pay

## 2013-12-23 NOTE — Telephone Encounter (Signed)
Diabetic Bundle. Left message with pt's wife to have pt call back and make appointment with Dr. Loanne Drilling.

## 2014-03-30 ENCOUNTER — Ambulatory Visit: Payer: BC Managed Care – PPO | Admitting: Adult Health

## 2014-04-28 ENCOUNTER — Other Ambulatory Visit: Payer: Self-pay | Admitting: Endocrinology

## 2014-07-13 NOTE — Telephone Encounter (Signed)
error 

## 2014-07-22 ENCOUNTER — Other Ambulatory Visit: Payer: Self-pay | Admitting: Endocrinology

## 2014-09-06 ENCOUNTER — Encounter: Payer: Self-pay | Admitting: Endocrinology

## 2014-09-06 ENCOUNTER — Other Ambulatory Visit: Payer: Self-pay | Admitting: Family Medicine

## 2014-09-06 ENCOUNTER — Ambulatory Visit (INDEPENDENT_AMBULATORY_CARE_PROVIDER_SITE_OTHER): Payer: BLUE CROSS/BLUE SHIELD | Admitting: Endocrinology

## 2014-09-06 VITALS — BP 112/64 | HR 82 | Temp 98.5°F | Resp 12 | Wt 169.6 lb

## 2014-09-06 DIAGNOSIS — K219 Gastro-esophageal reflux disease without esophagitis: Secondary | ICD-10-CM

## 2014-09-06 DIAGNOSIS — E1151 Type 2 diabetes mellitus with diabetic peripheral angiopathy without gangrene: Secondary | ICD-10-CM

## 2014-09-06 DIAGNOSIS — R1310 Dysphagia, unspecified: Secondary | ICD-10-CM

## 2014-09-06 LAB — BASIC METABOLIC PANEL
BUN: 16 mg/dL (ref 6–23)
CHLORIDE: 102 meq/L (ref 96–112)
CO2: 27 mEq/L (ref 19–32)
CREATININE: 0.69 mg/dL (ref 0.40–1.50)
Calcium: 9.2 mg/dL (ref 8.4–10.5)
GFR: 123.05 mL/min (ref >60.00)
Glucose, Bld: 319 mg/dL — ABNORMAL HIGH (ref 70–99)
Potassium: 3.9 mEq/L (ref 3.5–5.1)
Sodium: 133 mEq/L — ABNORMAL LOW (ref 135–145)

## 2014-09-06 LAB — HEMOGLOBIN A1C: HEMOGLOBIN A1C: 11.2 % — AB (ref 4.6–6.5)

## 2014-09-06 LAB — MICROALBUMIN / CREATININE URINE RATIO
Creatinine,U: 79.7 mg/dL
MICROALB/CREAT RATIO: 7.8 mg/g (ref 0.0–30.0)
Microalb, Ur: 6.2 mg/dL — ABNORMAL HIGH (ref 0.0–1.9)

## 2014-09-06 MED ORDER — METFORMIN HCL 500 MG PO TABS: ORAL_TABLET | ORAL | Status: DC

## 2014-09-06 MED ORDER — DULAGLUTIDE 1.5 MG/0.5ML ~~LOC~~ SOAJ: 1.5000 mg | SUBCUTANEOUS | Status: DC

## 2014-09-06 MED ORDER — CANAGLIFLOZIN 300 MG PO TABS: 300.0000 mg | ORAL_TABLET | Freq: Every day | ORAL | Status: DC

## 2014-09-06 NOTE — Progress Notes (Signed)
Subjective:    Patient ID: Luis Lindsey, male    DOB: August 03, 1950, 64 y.o.   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  He wants to change the [place] to victoza.   Past Medical History  Diagnosis Date  . CAROTID BRUIT, RIGHT 11/30/2009  . HYPERCHOLESTEROLEMIA 11/30/2009  . HYPERTENSION 11/30/2009  . SMOKER 11/30/2009  . URINARY CALCULUS 11/30/2009  . DM 11/30/2009  . Kidney stones   . BPH (benign prostatic hyperplasia)   . Heart murmur   . BCC (basal cell carcinoma)   . Right carotid bruit   . History of Holter monitoring 2009    Past Surgical History  Procedure Laterality Date  . Finger surgery  2002  . Neck surgery  1986  . Facial cosmetic surgery  2014  . Rotator cuff repair Left   . Cystoscopy    . Umbilical hernia repair      History   Social History  . Marital Status: Married    Spouse Name: [name]   . Number of Children: 3  . Years of Education: 11   Occupational History  . Truck Geophysicist/field seismologist    Social History Main Topics  . Smoking status: Former Smoker -- 40 years    Types: Cigarettes    Quit date: 01/01/2013  . Smokeless tobacco: Never Used     Comment: Quit [phone]  . Alcohol Use: No  . Drug Use: No  . Sexual Activity: Not on file   Other Topics Concern  . Not on file   Social History Narrative   . Patient drinks 2-3 cups of caffeine daily.    Patient lives at home with his wife [name].    Patient works at [employer].    Education. 12 th grade   Right handed                Current Outpatient Prescriptions on File Prior to Visit    Medication Sig Dispense Refill  . amLODipine-benazepril (LOTREL) 10-40 MG per capsule Take 1 capsule by mouth daily.      [name] aspirin (BAYER ASPIRIN) 325 MG tablet Take 1 tablet (325 mg total) by mouth daily. 30 tablet 6  . CHANTIX CONTINUING MONTH PAK 1 MG tablet Take 1 mg by mouth daily.    . hydrochlorothiazide (HYDRODIURIL) 25 MG tablet Take 25 mg by mouth daily.      . nateglinide (STARLIX) 120 MG tablet Take 1 tablet (120 mg total) by mouth 3 (three) times daily before meals. 270 tablet 11  . pioglitazone (ACTOS) 45 MG tablet TAKE 1 TABLET DAILY (NEED APPOINTMENT FOR FURTHER REFILLS) 90 tablet 0  . rosuvastatin (CRESTOR) 20 MG tablet Take 20 mg by mouth daily.     No current facility-administered medications on file prior to visit.    Allergies  Allergen Reactions  . Codeine   . Liraglutide     REACTION: vomiting    Family History  Problem Relation Age of Onset  . Diabetes Sister   . Diabetes Mellitus I Sister   . Stroke Father   . Heart disease Father   .  CVA Father   . Coronary artery disease Father   . Diabetes Mother   . Heart disease Mother   . Diabetes Mellitus I Mother   . Coronary artery disease Mother   . Coronary artery disease Brother   . Coronary artery disease Brother     BP 112/64 mmHg  Pulse 82  Temp(Src) 98.5 F (36.9 C) (Oral)  Resp 12  Wt 169 lb 9.6 oz (76.93 kg)  SpO2 99%    Review of Systems He denies hypoglycemia and weight change.      Objective:   Physical Exam VITAL SIGNS:  See vs page GENERAL: no distress Pulses: dorsalis pedis intact bilat.   MSK: no deformity of the feet CV: no leg edema Skin:  no ulcer on the feet.  normal color and temp on the feet. Neuro: sensation is intact to touch on the feet.     Lab Results  Component Value Date   HGBA1C 11.2* 09/06/2014      Assessment & Plan:  DM: worse Occupational status: same.  We discussed.  Pt says he will continue to work for now.  However, he says he'll take insulin in  a few mos if severe hyperglycemia persists.    Patient is advised the following: Patient Instructions  blood tests are requested for you today.  We'll let you know about the results. check your blood sugar once a day.  vary the time of day when you check, between before the 3 meals, and at bedtime.  also check if you have symptoms of your blood sugar being too high or too low.  please keep a record of the readings and bring it to your next appointment here.  You can write it on any piece of paper.  please call us sooner if your blood sugar goes below 70, or if you have a lot of readings over 200.  Based on the results, we can change januvia to victoza, or a similar med.    addendum: i have sent a prescription to your pharmacy, to change januvia to [place].

## 2014-09-06 NOTE — Patient Instructions (Addendum)
blood tests are requested for you today.  We'll let you know about the results. check your blood sugar once a day.  vary the time of day when you check, between before the 3 meals, and at bedtime.  also check if you have symptoms of your blood sugar being too high or too low.  please keep a record of the readings and bring it to your next appointment here.  You can write it on any piece of paper.  please call us sooner if your blood sugar goes below 70, or if you have a lot of readings over 200.  Based on the results, we can change januvia to victoza, or a similar med.

## 2014-09-07 DIAGNOSIS — Z0279 Encounter for issue of other medical certificate: Secondary | ICD-10-CM

## 2014-09-10 ENCOUNTER — Telehealth: Payer: Self-pay

## 2014-09-10 MED ORDER — NATEGLINIDE 120 MG PO TABS: 120.0000 mg | ORAL_TABLET | Freq: Three times a day (TID) | ORAL | Status: DC

## 2014-09-10 MED ORDER — CANAGLIFLOZIN 300 MG PO TABS: 300.0000 mg | ORAL_TABLET | Freq: Every day | ORAL | Status: DC

## 2014-09-10 NOTE — Telephone Encounter (Signed)
Pt informed that PA for invokana was approved.

## 2014-09-13 ENCOUNTER — Ambulatory Visit
Admission: RE | Admit: 2014-09-13 | Discharge: 2014-09-13 | Disposition: A | Discharge status: Home / Self Care | Payer: Self-pay | Source: Ambulatory Visit | Attending: Family Medicine | Admitting: Family Medicine

## 2014-09-13 DIAGNOSIS — K219 Gastro-esophageal reflux disease without esophagitis: Secondary | ICD-10-CM

## 2014-09-13 DIAGNOSIS — R1310 Dysphagia, unspecified: Secondary | ICD-10-CM

## 2015-03-15 ENCOUNTER — Telehealth: Payer: Self-pay | Admitting: Endocrinology

## 2015-03-15 NOTE — Telephone Encounter (Signed)
please call patient: Ov is due 

## 2015-03-16 NOTE — Telephone Encounter (Signed)
Left pt a Vm to return call fr OV

## 2015-09-18 ENCOUNTER — Other Ambulatory Visit: Payer: Self-pay | Admitting: Endocrinology

## 2015-09-19 NOTE — Telephone Encounter (Signed)
Please advise if ok to refill. Last appointment was 09/06/2014.

## 2015-11-24 ENCOUNTER — Other Ambulatory Visit: Payer: Self-pay | Admitting: Endocrinology

## 2016-10-10 DIAGNOSIS — R609 Edema, unspecified: Secondary | ICD-10-CM | POA: Diagnosis not present

## 2016-10-10 DIAGNOSIS — E782 Mixed hyperlipidemia: Secondary | ICD-10-CM | POA: Diagnosis not present

## 2016-10-10 DIAGNOSIS — I10 Essential (primary) hypertension: Secondary | ICD-10-CM | POA: Diagnosis not present

## 2016-10-10 DIAGNOSIS — R202 Paresthesia of skin: Secondary | ICD-10-CM | POA: Diagnosis not present

## 2016-10-10 DIAGNOSIS — E1142 Type 2 diabetes mellitus with diabetic polyneuropathy: Secondary | ICD-10-CM | POA: Diagnosis not present

## 2017-02-14 DIAGNOSIS — E782 Mixed hyperlipidemia: Secondary | ICD-10-CM | POA: Diagnosis not present

## 2017-02-14 DIAGNOSIS — Z23 Encounter for immunization: Secondary | ICD-10-CM | POA: Diagnosis not present

## 2017-02-14 DIAGNOSIS — I10 Essential (primary) hypertension: Secondary | ICD-10-CM | POA: Diagnosis not present

## 2017-02-14 DIAGNOSIS — Z7984 Long term (current) use of oral hypoglycemic drugs: Secondary | ICD-10-CM | POA: Diagnosis not present

## 2017-02-14 DIAGNOSIS — E1142 Type 2 diabetes mellitus with diabetic polyneuropathy: Secondary | ICD-10-CM | POA: Diagnosis not present

## 2017-02-17 DIAGNOSIS — S92411A Displaced fracture of proximal phalanx of right great toe, initial encounter for closed fracture: Secondary | ICD-10-CM | POA: Diagnosis not present

## 2017-02-18 ENCOUNTER — Telehealth (INDEPENDENT_AMBULATORY_CARE_PROVIDER_SITE_OTHER): Payer: Self-pay | Admitting: Orthopaedic Surgery

## 2017-02-18 NOTE — Telephone Encounter (Signed)
advise

## 2017-02-18 NOTE — Telephone Encounter (Signed)
Patient aware of the below message  

## 2017-02-18 NOTE — Telephone Encounter (Signed)
Patient called saying that he has broken his big toe, he went to the ER and they put a cast on it. They told him he was able to walk on it but he isn't able to put any pressure on it. He was wondering what he could do to help with the pain til his appointment on Wednesday. CB # [phone]

## 2017-02-18 NOTE — Telephone Encounter (Signed)
I assume the ER gave him something for pain. From my standpoint I would have him take 800 mg of ibuprofen 3 times a day with meals but I cannot do anything from a narcotic standpoint since we have not seen him yet.

## 2017-02-20 ENCOUNTER — Ambulatory Visit (INDEPENDENT_AMBULATORY_CARE_PROVIDER_SITE_OTHER): Payer: Medicare Other | Admitting: Orthopaedic Surgery

## 2017-02-20 DIAGNOSIS — S92411A Displaced fracture of proximal phalanx of right great toe, initial encounter for closed fracture: Secondary | ICD-10-CM | POA: Diagnosis not present

## 2017-02-20 MED ORDER — HYDROCODONE-ACETAMINOPHEN 5-325 MG PO TABS: 1.0000 | ORAL_TABLET | Freq: Three times a day (TID) | ORAL | 0 refills | Status: DC | PRN

## 2017-02-20 NOTE — Progress Notes (Signed)
Office Visit Note   Patient: Luis Lindsey           Date of Birth: 1951/02/01           MRN: [id number] Visit Date: 02/20/2017              Requested by: Alroy Dust, [name], [place] [address] [place], [place] [address] PCP: Alroy Dust, [name], MD   Assessment & Plan: Visit Diagnoses:  1. Displaced fracture of proximal phalanx of right great toe, initial encounter for closed fracture     Plan: We will have him try a postoperative shoe with weightbearing as tolerated but decreasing his activities until this heals or he is feeling significantly better. All questions concerns were addressed and answered. I would like to see him back in 3 weeks with an AP and lateral of his right great toe.  Follow-Up Instructions: Return in about 3 weeks (around 03/13/2017).   Orders:  No orders of the defined types were placed in this encounter.  Meds ordered this encounter  Medications  . HYDROcodone-acetaminophen (NORCO/VICODIN) 5-325 MG tablet    Sig: Take 1-2 tablets by mouth 3 (three) times daily as needed for moderate pain.    Dispense:  60 tablet    Refill:  0      Procedures: No procedures performed   Clinical Data: No additional findings.   Subjective: No chief complaint on file. Patient is very pleasant 66 year old diabetic male who injured his right great toe and an accidental fall a few days ago. He was seen at urgent care center and found to have a proximal phalanx fracture of the right great toe. He is placed in a cam walking boot and given follow-up our office. He does report significant swelling in the right foot and pain. He denies neuropathy on that side from his diabetes and reports decent diabetic control. He is being able to get around the cam walker. His pain is mild to moderate.  HPI  Review of Systems He currently denies any headache, chest pain, short of breath, fever, chills, nausea, vomiting  Objective: Vital Signs: There were no vitals taken for  this visit.  Physical Exam He is alert or 3 and in no acute distress Ortho Exam Examination of his right foot shows significant bruising around the great toe; he's neurovascularly intact. His nail is intact. His foot is well perfused. Specialty Comments:  No specialty comments available.  Imaging: No results found. X-rays independently reviewed show a proximal phalanx fracture the right great toe that is next articular fracture of the shaft acceptable alignment.  PMFS History: Patient Active Problem List   Diagnosis Date Noted  . Displaced fracture of proximal phalanx of right great toe, initial encounter for closed fracture 02/20/2017  . Diabetes ([place]) 08/24/2013  . History of Holter monitoring   . Heart murmur   . TIA (transient ischemic attack) 12/26/2012  . Kidney stones   . DM 11/30/2009  . HYPERCHOLESTEROLEMIA 11/30/2009  . SMOKER 11/30/2009  . HYPERTENSION 11/30/2009  . URINARY CALCULUS 11/30/2009  . CAROTID BRUIT, RIGHT 11/30/2009   Past Medical History:  Diagnosis Date  . BCC (basal cell carcinoma)   . BPH (benign prostatic hyperplasia)   . CAROTID BRUIT, RIGHT 11/30/2009  . DM 11/30/2009  . Heart murmur   . History of Holter monitoring 2009  . HYPERCHOLESTEROLEMIA 11/30/2009  . HYPERTENSION 11/30/2009  . Kidney stones   . Right carotid bruit   . SMOKER 11/30/2009  . URINARY  CALCULUS 11/30/2009    Family History  Problem Relation Age of Onset  . Diabetes Sister   . Diabetes Mellitus I Sister   . Stroke Father   . Heart disease Father   . CVA Father   . Coronary artery disease Father   . Diabetes Mother   . Heart disease Mother   . Diabetes Mellitus I Mother   . Coronary artery disease Mother   . Coronary artery disease Brother   . Coronary artery disease Brother     Past Surgical History:  Procedure Laterality Date  . CYSTOSCOPY    . FACIAL COSMETIC SURGERY  2014  . FINGER SURGERY  2002  . NECK SURGERY  1986  . ROTATOR CUFF REPAIR Left   . 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 Smoking status: Former Smoker    Years: 40.00    Types: Cigarettes    Quit date: 01/01/2013  . Smokeless tobacco: Never Used     Comment: Quit [phone]  . Alcohol use No  . Drug use: No  . Sexual activity: Not on file

## 2017-03-13 ENCOUNTER — Ambulatory Visit (INDEPENDENT_AMBULATORY_CARE_PROVIDER_SITE_OTHER): Payer: Medicare Other

## 2017-03-13 ENCOUNTER — Ambulatory Visit (INDEPENDENT_AMBULATORY_CARE_PROVIDER_SITE_OTHER): Payer: Medicare Other | Admitting: Physician Assistant

## 2017-03-13 DIAGNOSIS — S92411D Displaced fracture of proximal phalanx of right great toe, subsequent encounter for fracture with routine healing: Secondary | ICD-10-CM | POA: Insufficient documentation

## 2017-03-13 DIAGNOSIS — M7541 Impingement syndrome of right shoulder: Secondary | ICD-10-CM | POA: Diagnosis not present

## 2017-03-13 MED ORDER — METHYLPREDNISOLONE ACETATE 40 MG/ML IJ SUSP
40.0000 mg | INTRAMUSCULAR | Status: AC | PRN
  Administered 2017-03-13: 40 mg via INTRA_ARTICULAR

## 2017-03-13 MED ORDER — LIDOCAINE HCL 1 % IJ SOLN
3.0000 mL | INTRAMUSCULAR | Status: AC | PRN
  Administered 2017-03-13: 3 mL

## 2017-03-13 NOTE — Progress Notes (Signed)
Office Visit Note   Patient: Luis Lindsey           Date of Birth: November 15, 1950           MRN: [id number] Visit Date: 03/13/2017              Requested by: Alroy Dust, [name], [place] [address] [place], [place] [address] PCP: Alroy Dust, [name], MD   Assessment & Plan: Visit Diagnoses:  1. Closed displaced fracture of proximal phalanx of right great toe with routine healing, subsequent encounter   2. Impingement syndrome of right shoulder     Plan:He'll perform shoulder exercises as shown in today and demonstrated.weightbearing as tolerated in a regular shoe or the Cam Walker boot right foot.No high impact activities right foot.follow-up in 4 weeks at that time we'll obtain 3 views of the right great toe  Follow-Up Instructions: Return in about 4 weeks (around 04/10/2017).   Orders:  Orders Placed This Encounter  Procedures  . Large Joint Injection/Arthrocentesis  . XR Toe Great Right   No orders of the defined types were placed in this encounter.     Procedures: Large Joint Inj Date/Time: 03/13/2017 10:18 AM Performed by: Pete Pelt Authorized by: Pete Pelt   Consent Given by:  Patient Indications:  Pain Location:  Shoulder Site:  R subacromial bursa Needle Size:  22 G Needle Length:  1.5 inches Approach:  Anterolateral Ultrasound Guidance: No   Fluoroscopic Guidance: No   Arthrogram: No   Medications:  3 mL lidocaine 1 %; 40 mg methylPREDNISolone acetate 40 MG/ML Aspiration Attempted: No   Patient tolerance:  Patient tolerated the procedure well with no immediate complications      Clinical Data: No additional findings.   Subjective: Right great toe fracture Right shoulder pain  HPI Mr.Smitherman returns today follow-up of his right great toe fracture. He states that toe slowly getting better in regards to pain. He is in a regular shoe today for the first day. He has been a Banker. He is complaining of right shoulder pain  today states his back on on for a few months getting worse. Asking if he could have an injection today. No known injury to the right shoulder.He is diabetic states his glucose levels usually run no either 150 but he is able to ith insulin at home. Review of Systems See history of present illness otherwise negative  Objective: Vital Signs: There were no vitals taken for this visit.  Physical Exam  Constitutional: He is oriented to person, place, and time. He appears well-developed and well-nourished. No distress.  Pulmonary/Chest: Effort normal.  Neurological: He is alert and oriented to person, place, and time.  Skin: He is not diaphoretic.    Ortho Exam Bilateral shoulders is 5 out of 5 strengths with external and internal rotation against resistance. Positive impingement on the right negative on the left. Empty can test negative bilaterally.Full forward flexion bilateral shoulders however right is painful. Right great toe slight edema. No signs of infection. Toenails intact. No gross deformity.  Specialty Comments:  No specialty comments available.  Imaging: Xr Toe Great Right  Result Date: 03/13/2017 Right great toe 3 views: Comminuted proximal phalanx fracture remains in acceptable position alignment. There is some early consolidation    PMFS History: Patient Active Problem List   Diagnosis Date Noted  . Displaced fracture of proximal phalanx of right great toe with routine healing 03/13/2017  . Impingement syndrome of right shoulder 03/13/2017  .  Displaced fracture of proximal phalanx of right great toe, initial encounter for closed fracture 02/20/2017  . Diabetes ([place]) 08/24/2013  . History of Holter monitoring   . Heart murmur   . TIA (transient ischemic attack) 12/26/2012  . Kidney stones   . DM 11/30/2009  . HYPERCHOLESTEROLEMIA 11/30/2009  . SMOKER 11/30/2009  . HYPERTENSION 11/30/2009  . URINARY CALCULUS 11/30/2009  . CAROTID BRUIT, RIGHT 11/30/2009   Past  Medical History:  Diagnosis Date  . BCC (basal cell carcinoma)   . BPH (benign prostatic hyperplasia)   . CAROTID BRUIT, RIGHT 11/30/2009  . DM 11/30/2009  . Heart murmur   . History of Holter monitoring 2009  . HYPERCHOLESTEROLEMIA 11/30/2009  . HYPERTENSION 11/30/2009  . Kidney stones   . Right carotid bruit   . SMOKER 11/30/2009  . URINARY CALCULUS 11/30/2009    Family History  Problem Relation Age of Onset  . Diabetes Sister   . Diabetes Mellitus I Sister   . Stroke Father   . Heart disease Father   . CVA Father   . Coronary artery disease Father   . Diabetes Mother   . Heart disease Mother   . Diabetes Mellitus I Mother   . Coronary artery disease Mother   . Coronary artery disease Brother   . Coronary artery disease Brother     Past Surgical History:  Procedure Laterality Date  . CYSTOSCOPY    . FACIAL COSMETIC SURGERY  2014  . FINGER SURGERY  2002  . NECK SURGERY  1986  . ROTATOR CUFF REPAIR Left   . 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 Smoking status: Former Smoker    Years: 40.00    Types: Cigarettes    Quit date: 01/01/2013  . Smokeless tobacco: Never Used     Comment: Quit [phone]  . Alcohol use No  . Drug use: No  . Sexual activity: Not on file

## 2017-04-10 ENCOUNTER — Ambulatory Visit (INDEPENDENT_AMBULATORY_CARE_PROVIDER_SITE_OTHER): Payer: Medicare Other | Admitting: Orthopaedic Surgery

## 2017-04-16 ENCOUNTER — Encounter (INDEPENDENT_AMBULATORY_CARE_PROVIDER_SITE_OTHER): Payer: Self-pay | Admitting: Orthopaedic Surgery

## 2017-04-16 ENCOUNTER — Ambulatory Visit (INDEPENDENT_AMBULATORY_CARE_PROVIDER_SITE_OTHER): Payer: Medicare Other | Admitting: Orthopaedic Surgery

## 2017-04-16 ENCOUNTER — Ambulatory Visit (INDEPENDENT_AMBULATORY_CARE_PROVIDER_SITE_OTHER): Payer: Medicare Other

## 2017-04-16 ENCOUNTER — Telehealth (INDEPENDENT_AMBULATORY_CARE_PROVIDER_SITE_OTHER): Payer: Self-pay | Admitting: Orthopaedic Surgery

## 2017-04-16 DIAGNOSIS — G8929 Other chronic pain: Secondary | ICD-10-CM | POA: Insufficient documentation

## 2017-04-16 DIAGNOSIS — M25511 Pain in right shoulder: Secondary | ICD-10-CM | POA: Diagnosis not present

## 2017-04-16 DIAGNOSIS — S92411D Displaced fracture of proximal phalanx of right great toe, subsequent encounter for fracture with routine healing: Secondary | ICD-10-CM | POA: Diagnosis not present

## 2017-04-16 NOTE — Progress Notes (Signed)
Office Visit Note   Patient: Luis Lindsey           Date of Birth: February 16, 1951           MRN: [id number] Visit Date: 04/16/2017              Requested by: Alroy Dust, [name], [place] [address] [place], [place] [address] PCP: Alroy Dust, [name], MD   Assessment & Plan: Visit Diagnoses:  1. Closed displaced fracture of proximal phalanx of right great toe with routine healing, subsequent encounter   2. Chronic right shoulder pain     Plan: As far as his right great toe goes this seems to be improving slowly.  He is a diabetic and understands that it will take a while for it to heal completely however he is only having minimal discomfort at this point.  I do not need to x-ray it in the future because likely with time it will heal completely.  Of greater concern though is his right shoulder.  In spite of conservative treatment with rest, ice, heat, anti-inflammatories and a steroid injection as well as a home exercise program his pain is worsening is waking him up at night and his shoulder mobility has become worse as well the right shoulder.  At this point given the trauma that he had to the shoulder him worried about rotator cuff tear so we will send her for an MRI to evaluate his right shoulder.  All questions and concerns were answered and addressed.  Follow-Up Instructions: Return in about 2 weeks (around 04/30/2017).   Orders:  Orders Placed This Encounter  Procedures  . XR Toe Great Right   No orders of the defined types were placed in this encounter.     Procedures: No procedures performed   Clinical Data: No additional findings.   Subjective: Chief Complaint  Patient presents with  . Right Foot - Follow-up  . Right Shoulder - Pain, Follow-up  The patient is coming for 2 different things today.  One is in follow-up for her right great toe proximal phalanx fracture which seems to be doing well.  He reports she is only mild foot pain.  He is a diabetic but has  been under decent control.  Of greater concern though is his right shoulder pain and weakness.  We have tried rest, ice, heat, anti-inflammatories and a steroid injection but his stiffening is worsening and his pain is worsening.  He is developing weakness in the shoulder as well.  This is his dominant side.  He denies any neck pain he denies any numbness and tingling in his hand.  HPI  Review of Systems He currently denies any headache, chest pain, shortness of breath, fever, chills, nausea, vomiting.  Objective: Vital Signs: There were no vitals taken for this visit.  Physical Exam He is alert and oriented x3 and in no acute distress examination of his right shoulder Ortho Exam His deltoids more than abduction and having a difficult time getting past 90 degrees of abduction secondary to the severity of pain.  There is weakness with external rotation and he has a negative liftoff test.  His internal rotation with adduction is quite limited as well. Specialty Comments:  No specialty comments available.  Imaging: Xr Toe Great Right  Result Date: 04/16/2017 3 views of the right great toe show a proximal phalanx fracture with comminution but no significant malalignment.  The joints around the proximal phalanx of the great toe are normal.  PMFS History: Patient Active Problem List   Diagnosis Date Noted  . Chronic right shoulder pain 04/16/2017  . Displaced fracture of proximal phalanx of right great toe with routine healing 03/13/2017  . Impingement syndrome of right shoulder 03/13/2017  . Displaced fracture of proximal phalanx of right great toe, initial encounter for closed fracture 02/20/2017  . Diabetes ([place]) 08/24/2013  . History of Holter monitoring   . Heart murmur   . TIA (transient ischemic attack) 12/26/2012  . Kidney stones   . DM 11/30/2009  . HYPERCHOLESTEROLEMIA 11/30/2009  . SMOKER 11/30/2009  . HYPERTENSION 11/30/2009  . URINARY CALCULUS 11/30/2009  . CAROTID  BRUIT, RIGHT 11/30/2009   Past Medical History:  Diagnosis Date  . BCC (basal cell carcinoma)   . BPH (benign prostatic hyperplasia)   . CAROTID BRUIT, RIGHT 11/30/2009  . DM 11/30/2009  . Heart murmur   . History of Holter monitoring 2009  . HYPERCHOLESTEROLEMIA 11/30/2009  . HYPERTENSION 11/30/2009  . Kidney stones   . Right carotid bruit   . SMOKER 11/30/2009  . URINARY CALCULUS 11/30/2009    Family History  Problem Relation Age of Onset  . Diabetes Sister   . Diabetes Mellitus I Sister   . Stroke Father   . Heart disease Father   . CVA Father   . Coronary artery disease Father   . Diabetes Mother   . Heart disease Mother   . Diabetes Mellitus I Mother   . Coronary artery disease Mother   . Coronary artery disease Brother   . Coronary artery disease Brother     Past Surgical History:  Procedure Laterality Date  . CYSTOSCOPY    . FACIAL COSMETIC SURGERY  2014  . FINGER SURGERY  2002  . NECK SURGERY  1986  . ROTATOR CUFF REPAIR Left   . UMBILICAL HERNIA REPAIR     Social History   Occupational History  . Occupation: Truck Education administrator: [employer]  Tobacco Use  . Smoking status: Former Smoker    Years: 40.00    Types: Cigarettes    Last attempt to quit: 01/01/2013    Years since quitting: 4.2  . Smokeless tobacco: Never Used  . Tobacco comment: Quit 12-2012  Substance and Sexual Activity  . Alcohol use: No  . Drug use: No  . Sexual activity: Not on file

## 2017-04-16 NOTE — Telephone Encounter (Signed)
Called patient left message on voicemail to call back to R/S appointment. Advised provider will not be in the office 04/30/17

## 2017-04-30 ENCOUNTER — Ambulatory Visit (INDEPENDENT_AMBULATORY_CARE_PROVIDER_SITE_OTHER): Payer: Medicare Other | Admitting: Orthopaedic Surgery

## 2017-05-07 ENCOUNTER — Ambulatory Visit (INDEPENDENT_AMBULATORY_CARE_PROVIDER_SITE_OTHER): Payer: Medicare Other | Admitting: Orthopaedic Surgery

## 2017-05-27 ENCOUNTER — Other Ambulatory Visit (INDEPENDENT_AMBULATORY_CARE_PROVIDER_SITE_OTHER): Payer: Self-pay

## 2017-05-27 ENCOUNTER — Ambulatory Visit (INDEPENDENT_AMBULATORY_CARE_PROVIDER_SITE_OTHER): Payer: Medicare Other | Admitting: Orthopaedic Surgery

## 2017-05-27 ENCOUNTER — Encounter (INDEPENDENT_AMBULATORY_CARE_PROVIDER_SITE_OTHER): Payer: Self-pay | Admitting: Orthopaedic Surgery

## 2017-05-27 DIAGNOSIS — M25511 Pain in right shoulder: Principal | ICD-10-CM

## 2017-05-27 DIAGNOSIS — S92411D Displaced fracture of proximal phalanx of right great toe, subsequent encounter for fracture with routine healing: Secondary | ICD-10-CM | POA: Diagnosis not present

## 2017-05-27 DIAGNOSIS — G8929 Other chronic pain: Secondary | ICD-10-CM

## 2017-05-27 DIAGNOSIS — M7541 Impingement syndrome of right shoulder: Secondary | ICD-10-CM

## 2017-05-27 MED ORDER — LIDOCAINE HCL 1 % IJ SOLN
3.0000 mL | INTRAMUSCULAR | Status: AC | PRN
  Administered 2017-05-27: 3 mL

## 2017-05-27 MED ORDER — METHYLPREDNISOLONE ACETATE 40 MG/ML IJ SUSP
40.0000 mg | INTRAMUSCULAR | Status: AC | PRN
  Administered 2017-05-27: 40 mg via INTRA_ARTICULAR

## 2017-05-27 NOTE — Progress Notes (Signed)
Office Visit Note   Patient: Luis Lindsey           Date of Birth: Oct 31, 1950           MRN: [id number] Visit Date: 05/27/2017              Requested by: Alroy Dust, [name], [place] [address] [place], [place] [address] PCP: Alroy Dust, [name], MD   Assessment & Plan: Visit Diagnoses:  1. Closed displaced fracture of proximal phalanx of right great toe with routine healing, subsequent encounter   2. Impingement syndrome of right shoulder     Plan: Given now the severity of his pain with it worsening and the fact that he is developing a frozen shoulder and MRI is warranted to assess the soft tissues of the shoulder itself on the right side.  He is tried and failed all forms of conservative treatment and will place a second steroid injection of the third and the shoulder today and he is also tried home exercise program and physical therapy and anti-inflammatories as well as rest, ice, heat and time.  Given the fact that he is now developing a frozen shoulder secondary to pain an MRI is warranted.  We will see him back after the MRI is obtained.  All questions concerns were answered and addressed.  Follow-Up Instructions: Return in about 2 weeks (around 06/10/2017).   Orders:  No orders of the defined types were placed in this encounter.  No orders of the defined types were placed in this encounter.     Procedures: Large Joint Inj: R subacromial bursa on 05/27/2017 1:58 PM Indications: pain and diagnostic evaluation Details: 22 G 1.5 in needle  Arthrogram: No  Medications: 3 mL lidocaine 1 %; 40 mg methylPREDNISolone acetate 40 MG/ML Outcome: tolerated well, no immediate complications Procedure, treatment alternatives, risks and benefits explained, specific risks discussed. Consent was given by the patient. Immediately prior to procedure a time out was called to verify the correct patient, procedure, equipment, support staff and site/side marked as required. Patient was  prepped and draped in the usual sterile fashion.       Clinical Data: No additional findings.   Subjective: Chief Complaint  Patient presents with  . Right Foot - Follow-up  . Right Shoulder - Follow-up  The patient is mainly here for his right shoulder.  I saw him in November he tried and failed all forms of conservative treatment so we had recommended a shoulder MRI.  Somehow it never had ordered.  We are also following him for a great toe fracture on the right foot that since he has not given much problems and we did not need to x-ray that today.  His shoulder though he says is getting worse is getting more more stiff with that shoulder. HPI  Review of Systems He currently denies any headache, chest pain, shortness of breath, fever, chills, nausea, vomiting.  He is alert and oriented x3 in no acute distress  Objective: Vital Signs: There were no vitals taken for this visit.  Physical Exam  Ortho Exam He is alert and oriented x3 in no acute distress. Examination of his right shoulder shows significant stiffness with limited mobility in terms of me getting in past 90 degrees of abduction.  He is developing a frozen shoulder at this point. Specialty Comments:  No specialty comments available.  Imaging: No results found.   PMFS History: Patient Active Problem List   Diagnosis Date Noted  . Chronic  right shoulder pain 04/16/2017  . Displaced fracture of proximal phalanx of right great toe with routine healing 03/13/2017  . Impingement syndrome of right shoulder 03/13/2017  . Displaced fracture of proximal phalanx of right great toe, initial encounter for closed fracture 02/20/2017  . Diabetes ([place]) 08/24/2013  . History of Holter monitoring   . Heart murmur   . TIA (transient ischemic attack) 12/26/2012  . Kidney stones   . DM 11/30/2009  . HYPERCHOLESTEROLEMIA 11/30/2009  . SMOKER 11/30/2009  . HYPERTENSION 11/30/2009  . URINARY CALCULUS 11/30/2009  . CAROTID  BRUIT, RIGHT 11/30/2009   Past Medical History:  Diagnosis Date  . BCC (basal cell carcinoma)   . BPH (benign prostatic hyperplasia)   . CAROTID BRUIT, RIGHT 11/30/2009  . DM 11/30/2009  . Heart murmur   . History of Holter monitoring 2009  . HYPERCHOLESTEROLEMIA 11/30/2009  . HYPERTENSION 11/30/2009  . Kidney stones   . Right carotid bruit   . SMOKER 11/30/2009  . URINARY CALCULUS 11/30/2009    Family History  Problem Relation Age of Onset  . Diabetes Sister   . Diabetes Mellitus I Sister   . Stroke Father   . Heart disease Father   . CVA Father   . Coronary artery disease Father   . Diabetes Mother   . Heart disease Mother   . Diabetes Mellitus I Mother   . Coronary artery disease Mother   . Coronary artery disease Brother   . Coronary artery disease Brother     Past Surgical History:  Procedure Laterality Date  . CYSTOSCOPY    . FACIAL COSMETIC SURGERY  2014  . FINGER SURGERY  2002  . NECK SURGERY  1986  . ROTATOR CUFF REPAIR Left   . UMBILICAL HERNIA REPAIR     Social History   Occupational History  . Occupation: Truck Education administrator: [employer]  Tobacco Use  . Smoking status: Former Smoker    Years: 40.00    Types: Cigarettes    Last attempt to quit: 01/01/2013    Years since quitting: 4.4  . Smokeless tobacco: Never Used  . Tobacco comment: Quit 12-2012  Substance and Sexual Activity  . Alcohol use: No  . Drug use: No  . Sexual activity: Not on file

## 2017-05-30 ENCOUNTER — Other Ambulatory Visit (INDEPENDENT_AMBULATORY_CARE_PROVIDER_SITE_OTHER): Payer: Self-pay

## 2017-05-30 ENCOUNTER — Telehealth (INDEPENDENT_AMBULATORY_CARE_PROVIDER_SITE_OTHER): Payer: Self-pay | Admitting: Orthopaedic Surgery

## 2017-05-30 ENCOUNTER — Ambulatory Visit
Admission: RE | Admit: 2017-05-30 | Discharge: 2017-05-30 | Disposition: A | Discharge status: Home / Self Care | Payer: Medicare Other | Source: Ambulatory Visit | Attending: Orthopaedic Surgery | Admitting: Orthopaedic Surgery

## 2017-05-30 DIAGNOSIS — G8929 Other chronic pain: Secondary | ICD-10-CM

## 2017-05-30 DIAGNOSIS — M25511 Pain in right shoulder: Principal | ICD-10-CM

## 2017-05-30 MED ORDER — DIAZEPAM 5 MG PO TABS: ORAL_TABLET | ORAL | 0 refills | Status: DC

## 2017-05-30 NOTE — Telephone Encounter (Signed)
Patient aware this was called in for him and to call back [facility] to reschedule at his convience

## 2017-05-30 NOTE — Telephone Encounter (Signed)
Patient called advised he could not do the MRI because he is claustrophobic. Patient advised he will need some medication so that he can reschedule the MRI. The number to contact patient is [phone]

## 2017-06-03 ENCOUNTER — Other Ambulatory Visit (INDEPENDENT_AMBULATORY_CARE_PROVIDER_SITE_OTHER): Payer: Self-pay | Admitting: Orthopaedic Surgery

## 2017-06-03 DIAGNOSIS — G8929 Other chronic pain: Secondary | ICD-10-CM

## 2017-06-03 DIAGNOSIS — M25511 Pain in right shoulder: Principal | ICD-10-CM

## 2017-06-05 ENCOUNTER — Ambulatory Visit
Admission: RE | Admit: 2017-06-05 | Discharge: 2017-06-05 | Disposition: A | Discharge status: Home / Self Care | Payer: Medicare Other | Source: Ambulatory Visit | Attending: Orthopaedic Surgery | Admitting: Orthopaedic Surgery

## 2017-06-05 DIAGNOSIS — M25511 Pain in right shoulder: Principal | ICD-10-CM

## 2017-06-05 DIAGNOSIS — G8929 Other chronic pain: Secondary | ICD-10-CM

## 2017-06-10 ENCOUNTER — Encounter (INDEPENDENT_AMBULATORY_CARE_PROVIDER_SITE_OTHER): Payer: Self-pay | Admitting: Orthopaedic Surgery

## 2017-06-10 ENCOUNTER — Ambulatory Visit (INDEPENDENT_AMBULATORY_CARE_PROVIDER_SITE_OTHER): Payer: Medicare Other | Admitting: Orthopaedic Surgery

## 2017-06-10 DIAGNOSIS — M7541 Impingement syndrome of right shoulder: Secondary | ICD-10-CM

## 2017-06-10 DIAGNOSIS — S43431D Superior glenoid labrum lesion of right shoulder, subsequent encounter: Secondary | ICD-10-CM

## 2017-06-10 DIAGNOSIS — S43431A Superior glenoid labrum lesion of right shoulder, initial encounter: Secondary | ICD-10-CM | POA: Insufficient documentation

## 2017-06-10 NOTE — Progress Notes (Signed)
The patient is here for follow-up to go over an MRI of his right shoulder.  He said shoulder pain for some time now has been worsening.  He is tried and failed all forms of conservative treatment.  We actually performed arthroscopic surgery on his left shoulder in the past and found a significant rotator cuff tear that we had to repair.  He says the left side is been similar to him.  The pain is been worsening.  It hurts with any and all overhead activities.  On exam his rotator cuff itself feels intact to me but he definitely has pain with a positive Neer and Hawkins tests and positive jerk test.  The MRI is reviewed and does show a SLAP tear that is degenerative in nature.  There is also tendinosis of the rotator cuff.  The cuff itself appears to be intact and there is no muscle atrophy.  There is no significant arthritis in the shoulder.  Given the continued signs of impingement and the failure of conservative treatment he does wish to proceed with an arthroscopic intervention since his pain is worsening.  I showed her a shoulder model and went over the MRI in detail and explained the risk and benefits of the surgery.  Having had it before he is familiar with this type of surgery.  He does wish to have this scheduled soon.  We would then see him back in 1 week postoperative for suture removal.  All questions concerns were answered and addressed.

## 2017-06-20 DIAGNOSIS — M7551 Bursitis of right shoulder: Secondary | ICD-10-CM | POA: Diagnosis not present

## 2017-06-20 DIAGNOSIS — G8918 Other acute postprocedural pain: Secondary | ICD-10-CM | POA: Diagnosis not present

## 2017-06-20 DIAGNOSIS — M7541 Impingement syndrome of right shoulder: Secondary | ICD-10-CM | POA: Diagnosis not present

## 2017-06-20 DIAGNOSIS — S43431A Superior glenoid labrum lesion of right shoulder, initial encounter: Secondary | ICD-10-CM | POA: Diagnosis not present

## 2017-06-27 ENCOUNTER — Encounter (INDEPENDENT_AMBULATORY_CARE_PROVIDER_SITE_OTHER): Payer: Self-pay | Admitting: Orthopaedic Surgery

## 2017-06-27 ENCOUNTER — Ambulatory Visit (INDEPENDENT_AMBULATORY_CARE_PROVIDER_SITE_OTHER): Payer: Medicare Other | Admitting: Orthopaedic Surgery

## 2017-06-27 DIAGNOSIS — Z9889 Other specified postprocedural states: Secondary | ICD-10-CM

## 2017-06-27 NOTE — Progress Notes (Signed)
The patient is one-week status post a right shoulder arthroscopy with extensive debridement subacromial decompression.  He did have a superior posterior SLAP tear that we debrided.  He had significant inflamed tissue in the glenohumeral joint as well as bursitis in the subacromial space.  We performed a subacromial decompression in the same setting he is doing well overall.  On examination we removed all his sutures.  His right shoulder moves well and is actually nerves intact.  It is painful with overhead activities to be expected.  We went over his arthroscopy pictures and we talked about what the surgery involved.  He said this done by [place] on his left side before and did well.  I showed him some exercises on her try.  We will see him back in 4 weeks see how he is doing overall.  All questions concerns were answered and addressed.

## 2017-07-25 ENCOUNTER — Encounter (INDEPENDENT_AMBULATORY_CARE_PROVIDER_SITE_OTHER): Payer: Self-pay | Admitting: Orthopaedic Surgery

## 2017-07-25 ENCOUNTER — Ambulatory Visit (INDEPENDENT_AMBULATORY_CARE_PROVIDER_SITE_OTHER): Payer: Medicare Other | Admitting: Orthopaedic Surgery

## 2017-07-25 DIAGNOSIS — Z9889 Other specified postprocedural states: Secondary | ICD-10-CM

## 2017-07-25 MED ORDER — METHYLPREDNISOLONE ACETATE 40 MG/ML IJ SUSP
40.0000 mg | INTRAMUSCULAR | Status: AC | PRN
  Administered 2017-07-25: 40 mg via INTRA_ARTICULAR

## 2017-07-25 MED ORDER — LIDOCAINE HCL 1 % IJ SOLN
3.0000 mL | INTRAMUSCULAR | Status: AC | PRN
  Administered 2017-07-25: 3 mL

## 2017-07-25 NOTE — Progress Notes (Signed)
Office Visit Note   Patient: Luis Lindsey           Date of Birth: [birth year]           MRN: [id number] Visit Date: 07/25/2017              Requested by: Alroy Dust, [name], [place] [employer] [place], [place] [address] PCP: Alroy Dust, [name], MD   Assessment & Plan: Visit Diagnoses:  1. Status post arthroscopy of right shoulder     Plan: The patient would definitely benefit from a subacromial steroid injection of the right shoulder as well as formal outpatient physical therapy to hopefully increase his shoulder motion and mobility and decrease his pain.  He is agreeable to try this as well so we will work on getting this set up the [employer] outpatient physical therapy.  I will see him back myself in 6 weeks to see how is doing overall.  All questions concerns were answered and addressed.  Follow-Up Instructions: Return in about 6 weeks (around 09/05/2017).   Orders:  Orders Placed This Encounter  Procedures  . Large Joint Inj   No orders of the defined types were placed in this encounter.     Procedures: Large Joint Inj: R subacromial bursa on 07/25/2017 2:41 PM Indications: pain and diagnostic evaluation Details: 22 G 1.5 in needle  Arthrogram: No  Medications: 3 mL lidocaine 1 %; 40 mg methylPREDNISolone acetate 40 MG/ML Outcome: tolerated well, no immediate complications Procedure, treatment alternatives, risks and benefits explained, specific risks discussed. Consent was given by the patient. Immediately prior to procedure a time out was called to verify the correct patient, procedure, equipment, support staff and site/side marked as required. Patient was prepped and draped in the usual sterile fashion.       Clinical Data: No additional findings.   Subjective: Chief Complaint  Patient presents with  . Right Shoulder - Follow-up  Patient is now 4 weeks status post a subacromial decompression of the right shoulder and extensive debridement.  He  still having problems reaching overhead and behind and that he feels like he is making some progress.  HPI  Review of Systems He currently denies any headache, chest pain, shortness of breath, nausea, vomiting  Objective: Vital Signs: There were no vitals taken for this visit.  Physical Exam He is alert and oriented x3 and in no acute distress Ortho Exam Examination of the right shoulder still shows limited internal rotation with adduction but otherwise better motion overall.  He still having a significant amount of pain. Specialty Comments:  No specialty comments available.  Imaging: No results found.   PMFS History: Patient Active Problem List   Diagnosis Date Noted  . Status post arthroscopy of right shoulder 06/27/2017  . 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 Chronic right shoulder pain 04/16/2017  . Displaced fracture of proximal phalanx of right great toe with routine healing 03/13/2017  . Impingement syndrome of right shoulder 03/13/2017  . Displaced fracture of proximal phalanx of right great toe, initial encounter for closed fracture 02/20/2017  . Diabetes ([place]) 08/24/2013  . History of Holter monitoring   . Heart murmur   . TIA (transient ischemic attack) 12/26/2012  . Kidney stones   . DM 11/30/2009  . HYPERCHOLESTEROLEMIA 11/30/2009  . SMOKER 11/30/2009  . HYPERTENSION 11/30/2009  . URINARY CALCULUS 11/30/2009  . CAROTID BRUIT, RIGHT 11/30/2009   Past Medical History:  Diagnosis Date  . BCC (  basal cell carcinoma)   . BPH (benign prostatic hyperplasia)   . CAROTID BRUIT, RIGHT 11/30/2009  . DM 11/30/2009  . Heart murmur   . History of Holter monitoring 2009  . HYPERCHOLESTEROLEMIA 11/30/2009  . HYPERTENSION 11/30/2009  . Kidney stones   . Right carotid bruit   . SMOKER 11/30/2009  . URINARY CALCULUS 11/30/2009    Family History  Problem Relation Age of Onset  . Diabetes Sister   . Diabetes Mellitus I Sister   . Stroke Father   .  Heart disease Father   . CVA Father   . Coronary artery disease Father   . Diabetes Mother   . Heart disease Mother   . Diabetes Mellitus I Mother   . Coronary artery disease Mother   . Coronary artery disease Brother   . Coronary artery disease Brother     Past Surgical History:  Procedure Laterality Date  . CYSTOSCOPY    . FACIAL COSMETIC SURGERY  2014  . FINGER SURGERY  2002  . NECK SURGERY  1986  . ROTATOR CUFF REPAIR Left   . UMBILICAL HERNIA REPAIR     Social History   Occupational History  . Occupation: Truck Education administrator: [employer]  Tobacco Use  . Smoking status: Former Smoker    Years: 40.00    Types: Cigarettes    Last attempt to quit: 01/01/2013    Years since quitting: 4.5  . Smokeless tobacco: Never Used  . Tobacco comment: Quit 12-2012  Substance and Sexual Activity  . Alcohol use: No  . Drug use: No  . Sexual activity: Not on file

## 2017-07-26 ENCOUNTER — Other Ambulatory Visit (INDEPENDENT_AMBULATORY_CARE_PROVIDER_SITE_OTHER): Payer: Self-pay

## 2017-07-26 DIAGNOSIS — M25511 Pain in right shoulder: Principal | ICD-10-CM

## 2017-07-26 DIAGNOSIS — G8929 Other chronic pain: Secondary | ICD-10-CM

## 2017-08-01 ENCOUNTER — Ambulatory Visit: Payer: Medicare Other | Attending: Orthopaedic Surgery | Admitting: Physical Therapy

## 2017-08-01 ENCOUNTER — Other Ambulatory Visit: Payer: Self-pay

## 2017-08-01 ENCOUNTER — Encounter: Payer: Self-pay | Admitting: Physical Therapy

## 2017-08-01 DIAGNOSIS — M25511 Pain in right shoulder: Secondary | ICD-10-CM | POA: Insufficient documentation

## 2017-08-01 DIAGNOSIS — M6281 Muscle weakness (generalized): Secondary | ICD-10-CM | POA: Diagnosis not present

## 2017-08-01 DIAGNOSIS — M25611 Stiffness of right shoulder, not elsewhere classified: Secondary | ICD-10-CM | POA: Insufficient documentation

## 2017-08-01 NOTE — Therapy (Signed)
[facility] [address] [place], [place], [address] Phone: [phone]   Fax:  [phone]  Physical Therapy Evaluation  Patient Details  Name: Luis Lindsey MRN: [id number] Date of Birth: 10/16/1950 Referring Provider: Dr. Ninfa Linden   Encounter Date: 08/01/2017  PT End of Session - 08/01/17 1131    Visit Number  1    Date for PT Re-Evaluation  09/26/17    Authorization Type  Medicare:  KX at visit 15    PT Start Time  6962    PT Stop Time  1130    PT Time Calculation (min)  45 min    Activity Tolerance  Patient tolerated treatment well       Past Medical History:  Diagnosis Date  . BCC (basal cell carcinoma)   . BPH (benign prostatic hyperplasia)   . CAROTID BRUIT, RIGHT 11/30/2009  . DM 11/30/2009  . Heart murmur   . History of Holter monitoring 2009  . HYPERCHOLESTEROLEMIA 11/30/2009  . HYPERTENSION 11/30/2009  . Kidney stones   . Right carotid bruit   . SMOKER 11/30/2009  . URINARY CALCULUS 11/30/2009    Past Surgical History:  Procedure Laterality Date  . CYSTOSCOPY    . FACIAL COSMETIC SURGERY  2014  . FINGER SURGERY  2002  . NECK SURGERY  1986  . ROTATOR CUFF REPAIR Left   . UMBILICAL HERNIA REPAIR      There were no vitals filed for this visit.   Subjective Assessment - 08/01/17 1048    Subjective  No initial injury, wear and time for 6-8 months.  Had 06/20/17 had right shoulder scope for torn tendon and tendonitis (non-repair).  The doctor said it's not moving good enough.  Difficulty reaching behind the back and overhead.  Had subacromial injection 2/28.    Pertinent History  Had left shoulder surgery for rotator cuff repair 3 years ago with good motion;  right scope surgery SAD, SLAP tear debride 06/20/17;  MRI showed tendonosis but rotator cuff intact    Limitations  House hold activities    Diagnostic tests  MRI before surgery    Patient Stated Goals  Be able to put belt on; reach across body to  reach left shoulder;  get motion back     Currently in Pain?  Yes    Pain Score  3     Pain Location  Shoulder    Pain Orientation  Right;Lateral;Anterior    Pain Type  Surgical pain    Aggravating Factors   reaching behind back and overhead    Pain Relieving Factors  ibuprofen PM         [facility] PT Assessment - 08/01/17 0001      Assessment   Medical Diagnosis  right shoulder pain;  s/p arthroscopy    Referring Provider  Dr. Ninfa Linden    Onset Date/Surgical Date  06/20/17    Hand Dominance  Right    Next MD Visit  April 11    Prior Therapy  left shoulder before and after surgery      Precautions   Precautions  None      Restrictions   Weight Bearing Restrictions  No      Balance Screen   Has the patient fallen in the past 6 months  No    Has the patient had a decrease in activity level because of a fear of falling?   No    Is the patient reluctant to leave  their home because of a fear of falling?   No      Home Film/video editor residence    Living Arrangements  Spouse/significant other    Available Help at Discharge  Family      Prior Function   Level of [place]  Retired    Leisure  wood work shop haven't done much since surgery      Observation/Other Assessments   Focus on Therapeutic Outcomes (FOTO)   50% limitation       Posture/Postural Control   Posture/Postural Control  Postural limitations    Postural Limitations  Rounded Shoulders;Forward head      AROM   Right Shoulder Flexion  129 Degrees    Right Shoulder ABduction  100 Degrees    Right Shoulder Internal Rotation  -- lateral hip    Right Shoulder External Rotation  56 Degrees    Left Shoulder Flexion  150 Degrees    Left Shoulder ABduction  173 Degrees    Left Shoulder Internal Rotation  -- T10    Left Shoulder External Rotation  82 Degrees      Strength   Right Shoulder Flexion  4/5 in available ROM    Right Shoulder Extension  4/5    Right  Shoulder ABduction  4/5    Right Shoulder Internal Rotation  3+/5 in available ROM    Right Shoulder External Rotation  4-/5    Right Shoulder Horizontal ABduction  4-/5    Left Shoulder Flexion  5/5    Left Shoulder Extension  5/5    Left Shoulder ABduction  5/5    Left Shoulder Internal Rotation  5/5    Left Shoulder External Rotation  5/5      Palpation   Palpation comment  capsular tightness in all directions      Hawkins-Kennedy test   Findings  Positive    Side  Right      Empty Can test   Findings  Negative    Side  Right      Lag time at 0 degrees   Findings  Negative    Side  Right      Drop Arm test   Findings  Negative    Side  Right             Objective measurements completed on examination: See above findings.      [facility] Adult PT Treatment/Exercise - 08/01/17 0001      Shoulder Exercises: Supine   External Rotation  AAROM;Right;5 reps cane    Flexion  AAROM;Right;5 reps cane      Shoulder Exercises: Seated   External Rotation  -- on table top    Flexion  AAROM;Right table top      Shoulder Exercises: Pulleys   Flexion  1 minute             PT Education - 08/01/17 1158    Education provided  Yes    Education Details  stretching flexion and external rotation with cane or table top 2 min every 2 hours    Person(s) Educated  Patient    Methods  Explanation;Demonstration;Handout    Comprehension  Returned demonstration;Verbalized understanding       PT Short Term Goals - 08/01/17 1731      PT SHORT TERM GOAL #1   Title  The patient will demonstrate understanding of initial HEP and the importance of low load, long  duration and frequent ROM    Time  4    Period  Weeks    Status  New    Target Date  08/29/17      PT SHORT TERM GOAL #2   Title  The patient will report a 25% improvement in pain with usual ADLs including dressing and putting on a belt    Time  4    Period  Weeks    Status  New      PT SHORT TERM GOAL #3   Title   Right shoulder flexion/elevation to 140 degrees for reaching above head level shelf    Time  4    Period  Weeks    Status  New      PT SHORT TERM GOAL #4   Title  Patient will be able to reach L4-5 region needed for putting on his belt    Time  4    Period  Weeks    Status  New        PT Long Term Goals - 08/01/17 1740      PT LONG TERM GOAL #1   Title  The patient will be independent in safe self progression of HEP needed for further functional improvements    Time  8    Period  Weeks    Status  New    Target Date  09/26/17      PT LONG TERM GOAL #2   Title  The patient will report a 60% reduction in pain with usual ADLs including reaching overhead and putting on his belt    Time  8    Period  Weeks    Status  New      PT LONG TERM GOAL #3   Title  The patient will have improved flexion/elevation/scaption to 150 degrees for reaching overhead shelves    Time  8    Period  Weeks    Status  New      PT LONG TERM GOAL #4   Title  The patient will have grossly 4+/5 shoulder and scapular strength needed to return to woodworking in his shop    Time  8    Period  Weeks    Status  New      PT LONG TERM GOAL #5   Title  FOTO functional outcome score improved from 50% limitation to 31% indicating improved function with less pain    Time  8    Period  Weeks    Status  New             Plan - 08/01/17 1520    Clinical Impression Statement  The patient reports a 6-8 month history of right shoulder pain (dominant arm) with no specific injury.  He underwent right shoulder arthroscopic surgery for debridement of degenerative SLAP tear and subacromial decompression on 06/20/17.  He had a subacromial bursa injection on 2/28.  He complains of continued pain and lack of ROM especially with reaching behind his back and overhead.  Painful and limited ROM in all planes:  flexion 129 degrees, abduction 100 degrees, external rotation 56 degrees, internal rotation to lateral hip.    Capsular tightness.  Decreased glenohumeral and scapular mobility.   Strength within available ROM grossly 4-/5.  +Hawkins Kennedy; -Drop arm test, empty can and lag sign.  The patient would benefit from PT to address these deficits.      History and Personal Factors relevant to plan of care:  DM; HTN; hx of left rotator cuff pathology; few co-morbidities;    good home support    Clinical Presentation  Stable    Clinical Decision Making  Low    Rehab Potential  Good    Clinical Impairments Affecting Rehab Potential  none;  shoulder injection 2/28; surgery 06/20/17    PT Frequency  2x / week    PT Duration  8 weeks    PT Treatment/Interventions  ADLs/Self Care Home Management;Electrical Stimulation;Cryotherapy;Ultrasound;Moist Heat;Iontophoresis 4mg /ml Dexamethasone;Therapeutic exercise;Therapeutic activities;Patient/family education;Neuromuscular re-education;Manual techniques;Dry needling;Taping    PT Next Visit Plan  glenohumeral and scapular mobs;  UE Ranger on wall (low); pulleys for flexion and add internal rotation; review home stretches for flexion and external rotation    Consulted and Agree with Plan of Care  Patient       Patient will benefit from skilled therapeutic intervention in order to improve the following deficits and impairments:  Pain, Postural dysfunction, Decreased range of motion, Decreased strength, Impaired UE functional use, Impaired perceived functional ability, Hypomobility  Visit Diagnosis: Acute pain of right shoulder - Plan: PT plan of care cert/re-cert  Stiffness of right shoulder, not elsewhere classified - Plan: PT plan of care cert/re-cert  Muscle weakness (generalized) - Plan: PT plan of care cert/re-cert     Problem List Patient Active Problem List   Diagnosis Date Noted  . Status post arthroscopy of right shoulder 06/27/2017  . 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 Chronic right shoulder pain 04/16/2017  . Displaced  fracture of proximal phalanx of right great toe with routine healing 03/13/2017  . Impingement syndrome of right shoulder 03/13/2017  . Displaced fracture of proximal phalanx of right great toe, initial encounter for closed fracture 02/20/2017  . Diabetes ([place]) 08/24/2013  . History of Holter monitoring   . Heart murmur   . TIA (transient ischemic attack) 12/26/2012  . Kidney stones   . DM 11/30/2009  . HYPERCHOLESTEROLEMIA 11/30/2009  . SMOKER 11/30/2009  . HYPERTENSION 11/30/2009  . URINARY CALCULUS 11/30/2009  . CAROTID BRUIT, RIGHT 11/30/2009   Ruben Im, PT 08/01/17 5:48 PM Phone: [phone] Fax: [phone]  Alvera Singh 08/01/2017, 5:47 PM  [facility] [address] [place], [place], [address] Phone: [phone]   Fax:  [phone]  Name: Luis Lindsey MRN: [id number] Date of Birth: 1951-03-24

## 2017-08-01 NOTE — Patient Instructions (Signed)
  Cane Overhead - Supine  Hold cane at thighs with both hands, extend arms straight over head. Hold _20-20__ seconds. Repeat  Every 2 hours.    External Rotation (Eccentric), Active-Assist - Supine (Cane)  Lie on back, affected arm out from side, elbow at 90, forearm forward. Use cane to assist in lifting forearm of affected arm to neutral. Every 2 hours for 2 minutes.    Continue shower wall slides.   Table top stretches.   Copyright  VHI. All rights reserved.       Ruben Im PT [facility] [address], [place], [place] [address] Phone # [phone] Fax [phone]

## 2017-08-06 ENCOUNTER — Ambulatory Visit: Payer: Medicare Other | Admitting: Physical Therapy

## 2017-08-06 DIAGNOSIS — M6281 Muscle weakness (generalized): Secondary | ICD-10-CM

## 2017-08-06 DIAGNOSIS — M25511 Pain in right shoulder: Secondary | ICD-10-CM | POA: Diagnosis not present

## 2017-08-06 DIAGNOSIS — M25611 Stiffness of right shoulder, not elsewhere classified: Secondary | ICD-10-CM | POA: Diagnosis not present

## 2017-08-06 NOTE — Patient Instructions (Signed)
   Strengthening: Resisted Internal Rotation   Hold tubing in left hand, elbow at side and forearm out. Rotate forearm in across body. Repeat _15___ times per set. Do __1__ sets per session. Do ___1x/day http://orth.exer.us/830   Copyright  VHI. All rights reserved.  Strengthening: Resisted External Rotation   Hold tubing in right hand, elbow at side and forearm across body. Rotate forearm out. Repeat _15___ times per set. Do __1_ sets per session. Do __1__ sessions per day.  http://orth.exer.us/828   Copyright  VHI. All rights reserved.  Strengthening: Resisted Flexion   Hold tubing with left arm at side. Pull forward and up. Move shoulder through pain-free range of motion. Repeat ___15_ times per set. Do __1__ sets per session. Do _1___ sessions per day.  http://orth.exer.us/824   Copyright  VHI. All rights reserved.  Strengthening: Resisted Extension   Hold tubing in right hand, arm forward. Pull arm back, elbow straight. Repeat __15__ times per set. Do __1__ sets per session. Do _1___ sessions per day.  http://orth.exer.us/832   Copyright  VHI. All rights reserved.     Ruben Im PT [facility] [address], [place], [place] [address] Phone # [phone] Fax [phone]

## 2017-08-06 NOTE — Therapy (Signed)
[facility] [address] [place], [place], [address] Phone: [phone]   Fax:  [phone]  Physical Therapy Treatment  Patient Details  Name: Luis Lindsey MRN: [id number] Date of Birth: 17-Dec-1950 Referring Provider: Dr. Ninfa Linden   Encounter Date: 08/06/2017  PT End of Session - 08/06/17 1301    Visit Number  2    Date for PT Re-Evaluation  09/26/17    Authorization Type  Medicare:  KX at visit 15    PT Start Time  1234    PT Stop Time  1312    PT Time Calculation (min)  38 min       Past Medical History:  Diagnosis Date  . BCC (basal cell carcinoma)   . BPH (benign prostatic hyperplasia)   . CAROTID BRUIT, RIGHT 11/30/2009  . DM 11/30/2009  . Heart murmur   . History of Holter monitoring 2009  . HYPERCHOLESTEROLEMIA 11/30/2009  . HYPERTENSION 11/30/2009  . Kidney stones   . Right carotid bruit   . SMOKER 11/30/2009  . URINARY CALCULUS 11/30/2009    Past Surgical History:  Procedure Laterality Date  . CYSTOSCOPY    . FACIAL COSMETIC SURGERY  2014  . FINGER SURGERY  2002  . NECK SURGERY  1986  . ROTATOR CUFF REPAIR Left   . UMBILICAL HERNIA REPAIR      There were no vitals filed for this visit.  Subjective Assessment - 08/06/17 1249    Subjective  I got some shoulder pulleys and I've been working on it.  I can reach my back a little bit now.      Pertinent History  Had left shoulder surgery for rotator cuff repair 3 years ago with good motion;  right scope surgery SAD, SLAP tear debride 06/20/17;  MRI showed tendonosis but rotator cuff intact    Currently in Pain?  Yes    Pain Score  3     Pain Location  Shoulder    Pain Orientation  Right    Pain Type  Surgical pain                      [facility] Adult PT Treatment/Exercise - 08/06/17 0001      Therapeutic Activites    ADL's  reaching, pushing, pulling right UE      Shoulder Exercises: Sidelying   Other Sidelying Exercises  12/6:00 2x  10     Other Sidelying Exercises  9:00/3:00 2x10      Shoulder Exercises: Standing   External Rotation  Strengthening;Right;10 reps;Theraband    Theraband Level (Shoulder External Rotation)  Level 2 (Red)    Internal Rotation  Strengthening;Right;15 reps;Theraband    Theraband Level (Shoulder Internal Rotation)  Level 2 (Red)    Flexion  Strengthening;Right;10 reps;Theraband    Theraband Level (Shoulder Flexion)  Level 2 (Red)    Extension  Strengthening;Right;10 reps;Theraband    Theraband Level (Shoulder Extension)  Level 2 (Red)    Row  Strengthening;Right;10 reps;Theraband    Theraband Level (Shoulder Row)  Level 2 (Red)    Other Standing Exercises  red band diagonal extensions 15x PNF      Shoulder Exercises: Therapy Ball   Other Therapy Ball Exercises  ball roll red on wall 10x     Other Therapy Ball Exercises  ball roll on railing 10x      Shoulder Exercises: ROM/Strengthening   UBE (Upper Arm Bike)  2 forward/2 backward L2  Other ROM/Strengthening Exercises  UE Ranger L5 and L10 10x each    Other ROM/Strengthening Exercises  UE Ranger for internal rotation on wall 10x      Manual Therapy   Joint Mobilization  right glenohumeral distraction, inferior and posterior mobs at early mid and endrange    Soft tissue mobilization  pectoral, upper trap    Scapular Mobilization  medial and lateral glides              PT Education - 08/06/17 1301    Education provided  Yes    Education Details  [place] red band    Person(s) Educated  Patient    Methods  Explanation;Demonstration;Handout    Comprehension  Verbalized understanding;Returned demonstration       PT Short Term Goals - 08/01/17 1731      PT SHORT TERM GOAL #1   Title  The patient will demonstrate understanding of initial HEP and the importance of low load, long duration and frequent ROM    Time  4    Period  Weeks    Status  New    Target Date  08/29/17      PT SHORT TERM GOAL #2   Title  The patient  will report a 25% improvement in pain with usual ADLs including dressing and putting on a belt    Time  4    Period  Weeks    Status  New      PT SHORT TERM GOAL #3   Title  Right shoulder flexion/elevation to 140 degrees for reaching above head level shelf    Time  4    Period  Weeks    Status  New      PT SHORT TERM GOAL #4   Title  Patient will be able to reach L4-5 region needed for putting on his belt    Time  4    Period  Weeks    Status  New        PT Long Term Goals - 08/01/17 1740      PT LONG TERM GOAL #1   Title  The patient will be independent in safe self progression of HEP needed for further functional improvements    Time  8    Period  Weeks    Status  New    Target Date  09/26/17      PT LONG TERM GOAL #2   Title  The patient will report a 60% reduction in pain with usual ADLs including reaching overhead and putting on his belt    Time  8    Period  Weeks    Status  New      PT LONG TERM GOAL #3   Title  The patient will have improved flexion/elevation/scaption to 150 degrees for reaching overhead shelves    Time  8    Period  Weeks    Status  New      PT LONG TERM GOAL #4   Title  The patient will have grossly 4+/5 shoulder and scapular strength needed to return to woodworking in his shop    Time  8    Period  Weeks    Status  New      PT LONG TERM GOAL #5   Title  FOTO functional outcome score improved from 50% limitation to 31% indicating improved function with less pain    Time  8    Period  Weeks    Status  New            Plan - 08/06/17 1951    Clinical Impression Statement  The patient demonstrates good compliance with initial HEP.  He is able to participate in a progression of ROM and initiate a shoulder strengthening program with minimal complaint of pain.  Therapist closely monitoring response to all interventions.  Improved glenohumeral joint and scapular mobility.  Patient declines the need for modalities.      Rehab Potential   Good    Clinical Impairments Affecting Rehab Potential  none;  shoulder injection 2/28; surgery 06/20/17    PT Frequency  2x / week    PT Duration  8 weeks    PT Treatment/Interventions  ADLs/Self Care Home Management;Electrical Stimulation;Cryotherapy;Ultrasound;Moist Heat;Iontophoresis 4mg /ml Dexamethasone;Therapeutic exercise;Therapeutic activities;Patient/family education;Neuromuscular re-education;Manual techniques;Dry needling;Taping    PT Next Visit Plan  recheck shoulder ROM;  manual therapy glenohumeral and scapular mobs;  UE Ranger on wall;  try prone ex;  UBE       Patient will benefit from skilled therapeutic intervention in order to improve the following deficits and impairments:  Pain, Postural dysfunction, Decreased range of motion, Decreased strength, Impaired UE functional use, Impaired perceived functional ability, Hypomobility  Visit Diagnosis: Acute pain of right shoulder  Stiffness of right shoulder, not elsewhere classified  Muscle weakness (generalized)     Problem List Patient Active Problem List   Diagnosis Date Noted  . Status post arthroscopy of right shoulder 06/27/2017  . 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 Chronic right shoulder pain 04/16/2017  . Displaced fracture of proximal phalanx of right great toe with routine healing 03/13/2017  . Impingement syndrome of right shoulder 03/13/2017  . Displaced fracture of proximal phalanx of right great toe, initial encounter for closed fracture 02/20/2017  . Diabetes ([place]) 08/24/2013  . History of Holter monitoring   . Heart murmur   . TIA (transient ischemic attack) 12/26/2012  . Kidney stones   . DM 11/30/2009  . HYPERCHOLESTEROLEMIA 11/30/2009  . SMOKER 11/30/2009  . HYPERTENSION 11/30/2009  . URINARY CALCULUS 11/30/2009  . CAROTID BRUIT, RIGHT 11/30/2009   Ruben Im, PT 08/06/17 7:56 PM Phone: [phone] Fax: [phone]  Alvera Singh 08/06/2017, 7:56  PM  [facility] [address] [place], [place], [address] Phone: [phone]   Fax:  [phone]  Name: Luis Lindsey MRN: [id number] Date of Birth: 1951-04-01

## 2017-08-09 ENCOUNTER — Encounter: Payer: Self-pay | Admitting: Physical Therapy

## 2017-08-09 ENCOUNTER — Ambulatory Visit: Payer: Medicare Other | Admitting: Physical Therapy

## 2017-08-09 DIAGNOSIS — M25511 Pain in right shoulder: Secondary | ICD-10-CM

## 2017-08-09 DIAGNOSIS — M6281 Muscle weakness (generalized): Secondary | ICD-10-CM | POA: Diagnosis not present

## 2017-08-09 DIAGNOSIS — M25611 Stiffness of right shoulder, not elsewhere classified: Secondary | ICD-10-CM

## 2017-08-09 NOTE — Therapy (Signed)
[facility] [address] [place], [place], [address] Phone: [phone]   Fax:  [phone]  Physical Therapy Treatment  Patient Details  Name: Luis Lindsey MRN: [id number] Date of Birth: 12/31/1950 Referring Provider: Dr. Ninfa Linden   Encounter Date: 08/09/2017  PT End of Session - 08/09/17 1046    Visit Number  3    Date for PT Re-Evaluation  09/26/17    Authorization Type  Medicare:  KX at visit 15    PT Start Time  1010    PT Stop Time  1052    PT Time Calculation (min)  42 min    Activity Tolerance  Patient tolerated treatment well       Past Medical History:  Diagnosis Date  . BCC (basal cell carcinoma)   . BPH (benign prostatic hyperplasia)   . CAROTID BRUIT, RIGHT 11/30/2009  . DM 11/30/2009  . Heart murmur   . History of Holter monitoring 2009  . HYPERCHOLESTEROLEMIA 11/30/2009  . HYPERTENSION 11/30/2009  . Kidney stones   . Right carotid bruit   . SMOKER 11/30/2009  . URINARY CALCULUS 11/30/2009    Past Surgical History:  Procedure Laterality Date  . CYSTOSCOPY    . FACIAL COSMETIC SURGERY  2014  . FINGER SURGERY  2002  . NECK SURGERY  1986  . ROTATOR CUFF REPAIR Left   . UMBILICAL HERNIA REPAIR      There were no vitals filed for this visit.  Subjective Assessment - 08/09/17 1009    Subjective  I think I aggravated my shoulder yesterday when  I stretched it when I woke up.  I was half asleep and went too far.      Currently in Pain?  Yes    Pain Score  4     Pain Location  Shoulder    Pain Orientation  Right    Pain Type  Surgical pain         [facility] PT Assessment - 08/09/17 0001      AROM   Right Shoulder Flexion  153 Degrees    Right Shoulder ABduction  155 Degrees    Right Shoulder Internal Rotation  -- right SI    Right Shoulder External Rotation  72 Degrees                  [facility] Adult PT Treatment/Exercise - 08/09/17 0001      Therapeutic Activites    ADL's   reaching, pushing, pulling right UE      Shoulder Exercises: Prone   Extension  AROM;Right;15 reps    Horizontal ABduction 1  AROM;Right;15 reps    Horizontal ABduction 2  AROM;Right;15 reps scaption small range      Shoulder Exercises: Standing   Flexion  Strengthening;Right;15 reps;Theraband    Theraband Level (Shoulder Flexion)  Level 1 (Yellow) up and out on wall    Extension  Strengthening;Right;10 reps;Theraband    Theraband Level (Shoulder Extension)  Level 3 (Green)    Row  Strengthening;Right;10 reps;Theraband    Theraband Level (Shoulder Row)  Level 3 (Green)    Other Standing Exercises   yellow band wall walks 15x    Other Standing Exercises  small ball circles clock wise and counter clockwise       Shoulder Exercises: ROM/Strengthening   UBE (Upper Arm Bike)  90forward/2 backward L2    Other ROM/Strengthening Exercises  UE Ranger L 20 20x flexion, 15x scaption  Manual Therapy   Manual therapy comments  PNF min resist 5x D1 and D2 flex and extension    Soft tissue mobilization  pectoral, upper trap    Scapular Mobilization  medial and lateral glides                PT Short Term Goals - 08/09/17 1052      PT SHORT TERM GOAL #1   Title  The patient will demonstrate understanding of initial HEP and the importance of low load, long duration and frequent ROM    Status  Achieved      PT SHORT TERM GOAL #2   Title  The patient will report a 25% improvement in pain with usual ADLs including dressing and putting on a belt    Time  4    Period  Weeks    Status  On-going      PT SHORT TERM GOAL #3   Title  Right shoulder flexion/elevation to 140 degrees for reaching above head level shelf    Status  Achieved      PT SHORT TERM GOAL #4   Title  Patient will be able to reach L4-5 region needed for putting on his belt    Time  4    Period  Weeks    Status  On-going        PT Long Term Goals - 08/01/17 1740      PT LONG TERM GOAL #1   Title  The patient  will be independent in safe self progression of HEP needed for further functional improvements    Time  8    Period  Weeks    Status  New    Target Date  09/26/17      PT LONG TERM GOAL #2   Title  The patient will report a 60% reduction in pain with usual ADLs including reaching overhead and putting on his belt    Time  8    Period  Weeks    Status  New      PT LONG TERM GOAL #3   Title  The patient will have improved flexion/elevation/scaption to 150 degrees for reaching overhead shelves    Time  8    Period  Weeks    Status  New      PT LONG TERM GOAL #4   Title  The patient will have grossly 4+/5 shoulder and scapular strength needed to return to woodworking in his shop    Time  8    Period  Weeks    Status  New      PT LONG TERM GOAL #5   Title  FOTO functional outcome score improved from 50% limitation to 31% indicating improved function with less pain    Time  8    Period  Weeks    Status  New            Plan - 08/09/17 1047    Clinical Impression Statement  Excellent gains in right shoulder ROM in all planes since initial evaluation.  Internal rotation still limited but he is now able to reach his back pocket.  He declines the need for pain relieving modalities today.  Verbal and tactile cues to activate middle and lower traps.  Muscular fatigue with wall exercises.      Rehab Potential  Good    Clinical Impairments Affecting Rehab Potential  none;  shoulder injection 2/28; surgery 06/20/17    PT Frequency  2x / week    PT Duration  8 weeks    PT Treatment/Interventions  ADLs/Self Care Home Management;Electrical Stimulation;Cryotherapy;Ultrasound;Moist Heat;Iontophoresis 4mg /ml Dexamethasone;Therapeutic exercise;Therapeutic activities;Patient/family education;Neuromuscular re-education;Manual techniques;Dry needling;Taping    PT Next Visit Plan   manual therapy glenohumeral and scapular mobs;  UE Ranger on wall;   prone ex;  UBE       Patient will benefit from  skilled therapeutic intervention in order to improve the following deficits and impairments:  Pain, Postural dysfunction, Decreased range of motion, Decreased strength, Impaired UE functional use, Impaired perceived functional ability, Hypomobility  Visit Diagnosis: Acute pain of right shoulder  Stiffness of right shoulder, not elsewhere classified  Muscle weakness (generalized)     Problem List Patient Active Problem List   Diagnosis Date Noted  . Status post arthroscopy of right shoulder 06/27/2017  . 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 Chronic right shoulder pain 04/16/2017  . Displaced fracture of proximal phalanx of right great toe with routine healing 03/13/2017  . Impingement syndrome of right shoulder 03/13/2017  . Displaced fracture of proximal phalanx of right great toe, initial encounter for closed fracture 02/20/2017  . Diabetes ([place]) 08/24/2013  . History of Holter monitoring   . Heart murmur   . TIA (transient ischemic attack) 12/26/2012  . Kidney stones   . DM 11/30/2009  . HYPERCHOLESTEROLEMIA 11/30/2009  . SMOKER 11/30/2009  . HYPERTENSION 11/30/2009  . URINARY CALCULUS 11/30/2009  . CAROTID BRUIT, RIGHT 11/30/2009   Ruben Im, PT 08/09/17 10:55 AM Phone: [phone] Fax: [phone]  Alvera Singh 08/09/2017, 10:54 AM  [facility] [address], [place], [place], [address] Phone: [phone]   Fax:  [phone]  Name: Luis Lindsey MRN: [id number] Date of Birth: 11-16-1950

## 2017-08-13 ENCOUNTER — Encounter: Payer: Self-pay | Admitting: Physical Therapy

## 2017-08-13 ENCOUNTER — Ambulatory Visit: Payer: Medicare Other | Admitting: Physical Therapy

## 2017-08-13 DIAGNOSIS — M6281 Muscle weakness (generalized): Secondary | ICD-10-CM

## 2017-08-13 DIAGNOSIS — M25611 Stiffness of right shoulder, not elsewhere classified: Secondary | ICD-10-CM | POA: Diagnosis not present

## 2017-08-13 DIAGNOSIS — M25511 Pain in right shoulder: Secondary | ICD-10-CM

## 2017-08-13 NOTE — Therapy (Signed)
[facility] [address] [place], [place], [address] Phone: [phone]   Fax:  [phone]  Physical Therapy Treatment  Patient Details  Name: Luis Lindsey MRN: [id number] Date of Birth: Aug 24, 1950 Referring Provider: Dr. Ninfa Linden   Encounter Date: 08/13/2017  PT End of Session - 08/13/17 1134    Visit Number  4    Date for PT Re-Evaluation  09/26/17    Authorization Type  Medicare:  KX at visit 15    PT Start Time  1100    PT Stop Time  1140    PT Time Calculation (min)  40 min    Activity Tolerance  Patient tolerated treatment well       Past Medical History:  Diagnosis Date  . BCC (basal cell carcinoma)   . BPH (benign prostatic hyperplasia)   . CAROTID BRUIT, RIGHT 11/30/2009  . DM 11/30/2009  . Heart murmur   . History of Holter monitoring 2009  . HYPERCHOLESTEROLEMIA 11/30/2009  . HYPERTENSION 11/30/2009  . Kidney stones   . Right carotid bruit   . SMOKER 11/30/2009  . URINARY CALCULUS 11/30/2009    Past Surgical History:  Procedure Laterality Date  . CYSTOSCOPY    . FACIAL COSMETIC SURGERY  2014  . FINGER SURGERY  2002  . NECK SURGERY  1986  . ROTATOR CUFF REPAIR Left   . UMBILICAL HERNIA REPAIR      There were no vitals filed for this visit.  Subjective Assessment - 08/13/17 1105    Subjective  Minimal pain.      Currently in Pain?  Yes    Pain Score  2     Pain Location  Shoulder    Pain Orientation  Right    Pain Type  Surgical pain                      [facility] Adult PT Treatment/Exercise - 08/13/17 0001      Therapeutic Activites    ADL's  reaching, pushing, pulling right UE      Shoulder Exercises: Supine   Horizontal ABduction  Strengthening;Right;Left;Both;10 reps;Theraband    Theraband Level (Shoulder Horizontal ABduction)  Level 2 (Red)    External Rotation  Strengthening;Right;Left;Both;10 reps;Theraband    Theraband Level (Shoulder External Rotation)  Level 2  (Red)      Shoulder Exercises: Prone   Extension  AROM;Right;15 reps;Weights    Extension Weight (lbs)  1    Horizontal ABduction 1  AROM;Right;15 reps;Weights    Horizontal ABduction 1 Weight (lbs)  1    Horizontal ABduction 2  AROM;Right;15 reps;Weights scaption small range    Horizontal ABduction 2 Weight (lbs)  1    Other Prone Exercises  rows 1# 15x      Shoulder Exercises: Sidelying   ABduction  -- open books 10x    Other Sidelying Exercises  12/6:00 x 10 2#    Other Sidelying Exercises  9:00/3:00 x10 2#       Shoulder Exercises: Therapy Ball   Other Therapy Ball Exercises  ball roll red on wall 10x       Shoulder Exercises: ROM/Strengthening   UBE (Upper Arm Bike)  40forward/2 backward L2    Other ROM/Strengthening Exercises  UE Ranger L 24 20x flexion    Other ROM/Strengthening Exercises  countertop internal rotation 2x10      Manual Therapy   Manual therapy comments  passive internal and external rotation 5x 20 sec holds  Soft tissue mobilization  pectoral, upper trap    Scapular Mobilization  medial and lateral glides                PT Short Term Goals - 08/09/17 1052      PT SHORT TERM GOAL #1   Title  The patient will demonstrate understanding of initial HEP and the importance of low load, long duration and frequent ROM    Status  Achieved      PT SHORT TERM GOAL #2   Title  The patient will report a 25% improvement in pain with usual ADLs including dressing and putting on a belt    Time  4    Period  Weeks    Status  On-going      PT SHORT TERM GOAL #3   Title  Right shoulder flexion/elevation to 140 degrees for reaching above head level shelf    Status  Achieved      PT SHORT TERM GOAL #4   Title  Patient will be able to reach L4-5 region needed for putting on his belt    Time  4    Period  Weeks    Status  On-going        PT Long Term Goals - 08/01/17 1740      PT LONG TERM GOAL #1   Title  The patient will be independent in safe self  progression of HEP needed for further functional improvements    Time  8    Period  Weeks    Status  New    Target Date  09/26/17      PT LONG TERM GOAL #2   Title  The patient will report a 60% reduction in pain with usual ADLs including reaching overhead and putting on his belt    Time  8    Period  Weeks    Status  New      PT LONG TERM GOAL #3   Title  The patient will have improved flexion/elevation/scaption to 150 degrees for reaching overhead shelves    Time  8    Period  Weeks    Status  New      PT LONG TERM GOAL #4   Title  The patient will have grossly 4+/5 shoulder and scapular strength needed to return to woodworking in his shop    Time  8    Period  Weeks    Status  New      PT LONG TERM GOAL #5   Title  FOTO functional outcome score improved from 50% limitation to 31% indicating improved function with less pain    Time  8    Period  Weeks    Status  New            Plan - 08/13/17 1134    Clinical Impression Statement  The patient continues to make improvements in ROM, glenohumeral and scapular strength.  Improved scapular mobility and activation of scapular depressors and retractors.  Endrange stretching discomfort but no exacerbation of pain.  Muscle trembling with fatigue.  Therapist closely monitoring response during all interventions.      Rehab Potential  Good    Clinical Impairments Affecting Rehab Potential  none;  shoulder injection 2/28; surgery 06/20/17    PT Frequency  2x / week    PT Duration  8 weeks    PT Treatment/Interventions  ADLs/Self Care Home Management;Electrical Stimulation;Cryotherapy;Ultrasound;Moist Heat;Iontophoresis 4mg /ml Dexamethasone;Therapeutic exercise;Therapeutic activities;Patient/family education;Neuromuscular re-education;Manual techniques;Dry  needling;Taping    PT Next Visit Plan   recheck ROM;  manual therapy glenohumeral and scapular mobs;  UE Ranger on wall;   prone ex;  UBE       Patient will benefit from skilled  therapeutic intervention in order to improve the following deficits and impairments:  Pain, Postural dysfunction, Decreased range of motion, Decreased strength, Impaired UE functional use, Impaired perceived functional ability, Hypomobility  Visit Diagnosis: Acute pain of right shoulder  Stiffness of right shoulder, not elsewhere classified  Muscle weakness (generalized)     Problem List Patient Active Problem List   Diagnosis Date Noted  . Status post arthroscopy of right shoulder 06/27/2017  . 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 Chronic right shoulder pain 04/16/2017  . Displaced fracture of proximal phalanx of right great toe with routine healing 03/13/2017  . Impingement syndrome of right shoulder 03/13/2017  . Displaced fracture of proximal phalanx of right great toe, initial encounter for closed fracture 02/20/2017  . Diabetes ([place]) 08/24/2013  . History of Holter monitoring   . Heart murmur   . TIA (transient ischemic attack) 12/26/2012  . Kidney stones   . DM 11/30/2009  . HYPERCHOLESTEROLEMIA 11/30/2009  . SMOKER 11/30/2009  . HYPERTENSION 11/30/2009  . URINARY CALCULUS 11/30/2009  . CAROTID BRUIT, RIGHT 11/30/2009   Ruben Im, PT 08/13/17 11:39 AM Phone: [phone] Fax: [phone]  Alvera Singh 08/13/2017, 11:39 AM  [facility] [address] [place], [place], [address] Phone: [phone]   Fax:  [phone]  Name: Luis Lindsey MRN: [id number] Date of Birth: 10-09-1950

## 2017-08-14 DIAGNOSIS — E1165 Type 2 diabetes mellitus with hyperglycemia: Secondary | ICD-10-CM | POA: Diagnosis not present

## 2017-08-14 DIAGNOSIS — Z794 Long term (current) use of insulin: Secondary | ICD-10-CM | POA: Diagnosis not present

## 2017-08-14 DIAGNOSIS — E782 Mixed hyperlipidemia: Secondary | ICD-10-CM | POA: Diagnosis not present

## 2017-08-14 DIAGNOSIS — Z1159 Encounter for screening for other viral diseases: Secondary | ICD-10-CM | POA: Diagnosis not present

## 2017-08-14 DIAGNOSIS — I10 Essential (primary) hypertension: Secondary | ICD-10-CM | POA: Diagnosis not present

## 2017-08-14 DIAGNOSIS — E1142 Type 2 diabetes mellitus with diabetic polyneuropathy: Secondary | ICD-10-CM | POA: Diagnosis not present

## 2017-08-16 ENCOUNTER — Encounter: Payer: Self-pay | Admitting: Physical Therapy

## 2017-08-16 ENCOUNTER — Ambulatory Visit: Payer: Medicare Other | Admitting: Physical Therapy

## 2017-08-16 DIAGNOSIS — M25511 Pain in right shoulder: Secondary | ICD-10-CM | POA: Diagnosis not present

## 2017-08-16 DIAGNOSIS — M25611 Stiffness of right shoulder, not elsewhere classified: Secondary | ICD-10-CM

## 2017-08-16 DIAGNOSIS — M6281 Muscle weakness (generalized): Secondary | ICD-10-CM

## 2017-08-16 NOTE — Therapy (Signed)
[facility] [address] [place], [place], [address] Phone: [phone]   Fax:  [phone]  Physical Therapy Treatment  Patient Details  Name: Luis Lindsey MRN: [id number] Date of Birth: [birth year] Referring Provider: Dr. Ninfa Linden   Encounter Date: 08/16/2017  PT End of Session - 08/16/17 1206    Visit Number  5    Date for PT Re-Evaluation  09/26/17    Authorization Type  Medicare:  KX at visit 15    PT Start Time  1015    PT Stop Time  1100    PT Time Calculation (min)  45 min    Activity Tolerance  Patient tolerated treatment well       Past Medical History:  Diagnosis Date  . BCC (basal cell carcinoma)   . BPH (benign prostatic hyperplasia)   . CAROTID BRUIT, RIGHT 11/30/2009  . DM 11/30/2009  . Heart murmur   . History of Holter monitoring 2009  . HYPERCHOLESTEROLEMIA 11/30/2009  . HYPERTENSION 11/30/2009  . Kidney stones   . Right carotid bruit   . SMOKER 11/30/2009  . URINARY CALCULUS 11/30/2009    Past Surgical History:  Procedure Laterality Date  . CYSTOSCOPY    . FACIAL COSMETIC SURGERY  2014  . FINGER SURGERY  2002  . NECK SURGERY  1986  . ROTATOR CUFF REPAIR Left   . UMBILICAL HERNIA REPAIR      There were no vitals filed for this visit.  Subjective Assessment - 08/16/17 1020    Subjective  Just pain with reaching up.  Usually sore after therapy "but not terrible."      Currently in Pain?  No/denies    Pain Score  0-No pain    Pain Orientation  Right    Pain Type  Chronic pain         [facility] PT Assessment - 08/16/17 0001      AROM   Right Shoulder Flexion  150 Degrees    Right Shoulder ABduction  160 Degrees    Right Shoulder Internal Rotation  -- right SI     Right Shoulder External Rotation  70 Degrees            No data recorded       [facility] Adult PT Treatment/Exercise - 08/16/17 0001      Shoulder Exercises: Supine   Flexion  -- 12/6 2# 9/3 2# 15x each      Shoulder Exercises: Standing   Row  Strengthening;Both;20 reps;Weights    Row Weight (lbs)  25    Other Standing Exercises  3 ways on wall 10x    Other Standing Exercises  small arcs wall walking red band 12x      Shoulder Exercises: ROM/Strengthening   Other ROM/Strengthening Exercises  UE Ranger L 25 15x flexion 15x abduction       Shoulder Exercises: Stretch   Corner Stretch  3 reps;20 seconds    Other Shoulder Stretches  foam roll quadruped stretch for flexion 3x 20 sec      Manual Therapy   Manual therapy comments  passive internal and external rotation 5x 20 sec holds    Joint Mobilization  contract relax to increased internal and external rotation     Soft tissue mobilization  pectoral, upper trap    Scapular Mobilization  medial and lateral glides         posterior capsule stretch on wall 3x 20 sec hold  PT Short Term Goals - 08/16/17 1210      PT SHORT TERM GOAL #1   Title  The patient will demonstrate understanding of initial HEP and the importance of low load, long duration and frequent ROM    Status  Achieved      PT SHORT TERM GOAL #2   Title  The patient will report a 25% improvement in pain with usual ADLs including dressing and putting on a belt    Time  4    Period  Weeks    Status  On-going      PT SHORT TERM GOAL #3   Title  Right shoulder flexion/elevation to 140 degrees for reaching above head level shelf    Status  Achieved        PT Long Term Goals - 08/01/17 1740      PT LONG TERM GOAL #1   Title  The patient will be independent in safe self progression of HEP needed for further functional improvements    Time  8    Period  Weeks    Status  New    Target Date  09/26/17      PT LONG TERM GOAL #2   Title  The patient will report a 60% reduction in pain with usual ADLs including reaching overhead and putting on his belt    Time  8    Period  Weeks    Status  New      PT LONG TERM GOAL #3   Title  The patient will have improved  flexion/elevation/scaption to 150 degrees for reaching overhead shelves    Time  8    Period  Weeks    Status  New      PT LONG TERM GOAL #4   Title  The patient will have grossly 4+/5 shoulder and scapular strength needed to return to woodworking in his shop    Time  8    Period  Weeks    Status  New      PT LONG TERM GOAL #5   Title  FOTO functional outcome score improved from 50% limitation to 31% indicating improved function with less pain    Time  8    Period  Weeks    Status  New            Plan - 08/16/17 1207    Clinical Impression Statement  The patient has improved with shoulder abduction ROM.  Decreased pectoral muscle length may be affecting lack of internal rotation ROM.  Pain is primarily with endrange stretching.  Therapist providing cues to activate scapular musculature and to monitor pain response.  Should meet remaining STGs next week.      Rehab Potential  Good    Clinical Impairments Affecting Rehab Potential  none;  shoulder injection 2/28; surgery 06/20/17    PT Frequency  2x / week    PT Treatment/Interventions  ADLs/Self Care Home Management;Electrical Stimulation;Cryotherapy;Ultrasound;Moist Heat;Iontophoresis 4mg /ml Dexamethasone;Therapeutic exercise;Therapeutic activities;Patient/family education;Neuromuscular re-education;Manual techniques;Dry needling;Taping    PT Next Visit Plan   ROM for overhead and internal rotation;  manual therapy glenohumeral and scapular mobs;  UE Ranger on wall;   prone ex;  UBE       Patient will benefit from skilled therapeutic intervention in order to improve the following deficits and impairments:  Pain, Postural dysfunction, Decreased range of motion, Decreased strength, Impaired UE functional use, Impaired perceived functional ability, Hypomobility  Visit Diagnosis: Acute pain of right shoulder  Stiffness of right shoulder, not elsewhere classified  Muscle weakness (generalized)     Problem List Patient Active  Problem List   Diagnosis Date Noted  . Status post arthroscopy of right shoulder 06/27/2017  . 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 Chronic right shoulder pain 04/16/2017  . Displaced fracture of proximal phalanx of right great toe with routine healing 03/13/2017  . Impingement syndrome of right shoulder 03/13/2017  . Displaced fracture of proximal phalanx of right great toe, initial encounter for closed fracture 02/20/2017  . Diabetes ([place]) 08/24/2013  . History of Holter monitoring   . Heart murmur   . TIA (transient ischemic attack) 12/26/2012  . Kidney stones   . DM 11/30/2009  . HYPERCHOLESTEROLEMIA 11/30/2009  . SMOKER 11/30/2009  . HYPERTENSION 11/30/2009  . URINARY CALCULUS 11/30/2009  . CAROTID BRUIT, RIGHT 11/30/2009   Ruben Im, PT 08/16/17 12:12 PM Phone: [phone] Fax: [phone]  Alvera Singh 08/16/2017, 12:11 PM  [facility] [address], [place], [place], [address] Phone: [phone]   Fax:  [phone]  Name: Luis Lindsey MRN: [id number] Date of Birth: [birth year]

## 2017-08-20 ENCOUNTER — Encounter: Payer: Self-pay | Admitting: Physical Therapy

## 2017-08-20 ENCOUNTER — Ambulatory Visit: Payer: Medicare Other | Admitting: Physical Therapy

## 2017-08-20 DIAGNOSIS — M25511 Pain in right shoulder: Secondary | ICD-10-CM | POA: Diagnosis not present

## 2017-08-20 DIAGNOSIS — M6281 Muscle weakness (generalized): Secondary | ICD-10-CM | POA: Diagnosis not present

## 2017-08-20 DIAGNOSIS — M25611 Stiffness of right shoulder, not elsewhere classified: Secondary | ICD-10-CM | POA: Diagnosis not present

## 2017-08-20 NOTE — Therapy (Signed)
[facility] [address], [place], [place], [address] Phone: [phone]   Fax:  [phone]  Physical Therapy Treatment  Patient Details  Name: Luis Lindsey MRN: [id number] Date of Birth: 26-Jun-1950 Referring Provider: Dr. Ninfa Linden   Encounter Date: 08/20/2017  PT End of Session - 08/20/17 1713    Visit Number  6    Date for PT Re-Evaluation  09/26/17    Authorization Type  Medicare:  KX at visit 15    PT Start Time  0973    PT Stop Time  1230    PT Time Calculation (min)  45 min    Activity Tolerance  Patient tolerated treatment well       Past Medical History:  Diagnosis Date  . BCC (basal cell carcinoma)   . BPH (benign prostatic hyperplasia)   . CAROTID BRUIT, RIGHT 11/30/2009  . DM 11/30/2009  . Heart murmur   . History of Holter monitoring 2009  . HYPERCHOLESTEROLEMIA 11/30/2009  . HYPERTENSION 11/30/2009  . Kidney stones   . Right carotid bruit   . SMOKER 11/30/2009  . URINARY CALCULUS 11/30/2009    Past Surgical History:  Procedure Laterality Date  . CYSTOSCOPY    . FACIAL COSMETIC SURGERY  2014  . FINGER SURGERY  2002  . NECK SURGERY  1986  . ROTATOR CUFF REPAIR Left   . UMBILICAL HERNIA REPAIR      There were no vitals filed for this visit.  Subjective Assessment - 08/20/17 1145    Subjective  Shoulder not too good this morning which is unusual.  I had to take ibuprofen.  It feels better now though.      Pertinent History  Had left shoulder surgery for rotator cuff repair 3 years ago with good motion;  right scope surgery SAD, SLAP tear debride 06/20/17;  MRI showed tendonosis but rotator cuff intact    Currently in Pain?  Yes    Pain Score  4     Pain Location  Shoulder    Pain Orientation  Right    Pain Type  Chronic pain                No data recorded       [facility] Adult PT Treatment/Exercise - 08/20/17 0001      Therapeutic Activites    ADL's  reaching, pushing, pulling  right UE      Shoulder Exercises: Seated   Other Seated Exercises  thoracic extension with small  ball 10x      Shoulder Exercises: Sidelying   Other Sidelying Exercises  abduction and flexion 10x each    Other Sidelying Exercises  "around the world" 3x ROM       Shoulder Exercises: Standing   Protraction  -- Ys to shoulder level 2# 10x with mirror feedback    Flexion  Strengthening;Both;10 reps;Weights    Shoulder Flexion Weight (lbs)  2    ABduction  Strengthening;Both;10 reps;Weights    Shoulder ABduction Weight (lbs)  2    Extension  Strengthening;Right;20 reps;Theraband    Theraband Level (Shoulder Extension)  Level 3 (Green)    Row  Strengthening;Right;20 reps;Theraband    Theraband Level (Shoulder Row)  Level 3 (Green)    Other Standing Exercises  double and single arm push ups 5x each    Other Standing Exercises  ABC on small ball on wall       Shoulder Exercises: ROM/Strengthening   Other ROM/Strengthening Exercises  UE Ranger L 25 15x flexion 15x abduction     Other ROM/Strengthening Exercises  child's pose arm lift 2x5       Manual Therapy   Manual therapy comments  passive internal and external rotation 5x 20 sec holds    Joint Mobilization  contract relax to increased internal and external rotation     Soft tissue mobilization  pectoral, upper trap    Scapular Mobilization  medial and lateral glides                PT Short Term Goals - 08/16/17 1210      PT SHORT TERM GOAL #1   Title  The patient will demonstrate understanding of initial HEP and the importance of low load, long duration and frequent ROM    Status  Achieved      PT SHORT TERM GOAL #2   Title  The patient will report a 25% improvement in pain with usual ADLs including dressing and putting on a belt    Time  4    Period  Weeks    Status  On-going      PT SHORT TERM GOAL #3   Title  Right shoulder flexion/elevation to 140 degrees for reaching above head level shelf    Status  Achieved         PT Long Term Goals - 08/01/17 1740      PT LONG TERM GOAL #1   Title  The patient will be independent in safe self progression of HEP needed for further functional improvements    Time  8    Period  Weeks    Status  New    Target Date  09/26/17      PT LONG TERM GOAL #2   Title  The patient will report a 60% reduction in pain with usual ADLs including reaching overhead and putting on his belt    Time  8    Period  Weeks    Status  New      PT LONG TERM GOAL #3   Title  The patient will have improved flexion/elevation/scaption to 150 degrees for reaching overhead shelves    Time  8    Period  Weeks    Status  New      PT LONG TERM GOAL #4   Title  The patient will have grossly 4+/5 shoulder and scapular strength needed to return to woodworking in his shop    Time  8    Period  Weeks    Status  New      PT LONG TERM GOAL #5   Title  FOTO functional outcome score improved from 50% limitation to 31% indicating improved function with less pain    Time  8    Period  Weeks    Status  New            Plan - 08/20/17 1713    Clinical Impression Statement  The patient reports some increased discomfort earlier this morning which could be related to yardwork yesterday.  Overall his pain level has been low and is primarily with end range stretching.  He is able to participate in intermediate level ROM and strengthening exercises without an increase in his pain.  Verbal and tactile cues to active periscapular muscles with exercise.  He declines the need for modalities and will apply ice at home as needed.  Should meet all STGs within the next week.  Rehab Potential  Good    Clinical Impairments Affecting Rehab Potential  none;  shoulder injection 2/28; surgery 06/20/17    PT Frequency  2x / week    PT Duration  8 weeks    PT Treatment/Interventions  ADLs/Self Care Home Management;Electrical Stimulation;Cryotherapy;Ultrasound;Moist Heat;Iontophoresis 4mg /ml  Dexamethasone;Therapeutic exercise;Therapeutic activities;Patient/family education;Neuromuscular re-education;Manual techniques;Dry needling;Taping    PT Next Visit Plan   ROM for overhead and internal rotation;  manual therapy glenohumeral and scapular mobs;  UE Ranger on wall;   prone ex;  UBE;  recheck ROM and MMT for STGs       Patient will benefit from skilled therapeutic intervention in order to improve the following deficits and impairments:  Pain, Postural dysfunction, Decreased range of motion, Decreased strength, Impaired UE functional use, Impaired perceived functional ability, Hypomobility  Visit Diagnosis: Acute pain of right shoulder  Stiffness of right shoulder, not elsewhere classified  Muscle weakness (generalized)     Problem List Patient Active Problem List   Diagnosis Date Noted  . Status post arthroscopy of right shoulder 06/27/2017  . 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 Chronic right shoulder pain 04/16/2017  . Displaced fracture of proximal phalanx of right great toe with routine healing 03/13/2017  . Impingement syndrome of right shoulder 03/13/2017  . Displaced fracture of proximal phalanx of right great toe, initial encounter for closed fracture 02/20/2017  . Diabetes ([place]) 08/24/2013  . History of Holter monitoring   . Heart murmur   . TIA (transient ischemic attack) 12/26/2012  . Kidney stones   . DM 11/30/2009  . HYPERCHOLESTEROLEMIA 11/30/2009  . SMOKER 11/30/2009  . HYPERTENSION 11/30/2009  . URINARY CALCULUS 11/30/2009  . CAROTID BRUIT, RIGHT 11/30/2009   Ruben Im, PT 08/20/17 5:22 PM Phone: [phone] Fax: [phone]  Alvera Singh 08/20/2017, Lajuan Lines PM   [facility] [address] [place], [place], [address] Phone: [phone]   Fax:  [phone]  Name: Luis Lindsey MRN: [id number] Date of Birth: 26-Aug-1950

## 2017-08-22 ENCOUNTER — Ambulatory Visit: Payer: Medicare Other | Admitting: Physical Therapy

## 2017-08-22 ENCOUNTER — Encounter: Payer: Self-pay | Admitting: Physical Therapy

## 2017-08-22 DIAGNOSIS — M6281 Muscle weakness (generalized): Secondary | ICD-10-CM | POA: Diagnosis not present

## 2017-08-22 DIAGNOSIS — M25611 Stiffness of right shoulder, not elsewhere classified: Secondary | ICD-10-CM

## 2017-08-22 DIAGNOSIS — M25511 Pain in right shoulder: Secondary | ICD-10-CM | POA: Diagnosis not present

## 2017-08-22 NOTE — Therapy (Signed)
[facility] [address], [place], [place], [address] Phone: [phone]   Fax:  [phone]  Physical Therapy Treatment  Patient Details  Name: Luis Lindsey MRN: [id number] Date of Birth: 11/17/1950 Referring Provider: Dr. Ninfa Linden   Encounter Date: 08/22/2017  PT End of Session - 08/22/17 0931    Visit Number  7    Date for PT Re-Evaluation  09/26/17    Authorization Type  Medicare:  KX at visit 15    PT Start Time  0845    PT Stop Time  0926    PT Time Calculation (min)  41 min    Activity Tolerance  Patient tolerated treatment well    Behavior During Therapy  [facility] for tasks assessed/performed       Past Medical History:  Diagnosis Date  . BCC (basal cell carcinoma)   . BPH (benign prostatic hyperplasia)   . CAROTID BRUIT, RIGHT 11/30/2009  . DM 11/30/2009  . Heart murmur   . History of Holter monitoring 2009  . HYPERCHOLESTEROLEMIA 11/30/2009  . HYPERTENSION 11/30/2009  . Kidney stones   . Right carotid bruit   . SMOKER 11/30/2009  . URINARY CALCULUS 11/30/2009    Past Surgical History:  Procedure Laterality Date  . CYSTOSCOPY    . FACIAL COSMETIC SURGERY  2014  . FINGER SURGERY  2002  . NECK SURGERY  1986  . ROTATOR CUFF REPAIR Left   . UMBILICAL HERNIA REPAIR      There were no vitals filed for this visit.  Subjective Assessment - 08/22/17 0850    Subjective  shoulder is doing okay today, c/o slight pain    Patient Stated Goals  Be able to put belt on; reach across body to reach left shoulder;  get motion back     Currently in Pain?  Yes    Pain Score  2     Pain Location  Shoulder    Pain Orientation  Right    Pain Descriptors / Indicators  Aching    Pain Type  Chronic pain    Aggravating Factors   reaching behind back and overhead    Pain Relieving Factors  ibuprofen PM                No data recorded       [facility] Adult PT Treatment/Exercise - 08/22/17 0849      Shoulder  Exercises: Standing   External Rotation  Right;20 reps;Theraband    Theraband Level (Shoulder External Rotation)  Level 3 (Green)    Flexion  Right;15 reps;Weights    Shoulder Flexion Weight (lbs)  2    ABduction  Right;15 reps;Weights    Shoulder ABduction Weight (lbs)  2    Extension  Strengthening;Both;20 reps;Theraband    Theraband Level (Shoulder Extension)  Level 3 (Green)    Row  Strengthening;Both;20 reps;Theraband    Theraband Level (Shoulder Row)  Level 3 (Green)    Other Standing Exercises  2nd shelf cabinet height horizontal adduction x 15 with 1# wrist weight;     Other Standing Exercises  flexion and scaption counter to 2nd cabinet height with 1# wrist weight x15 reps each      Shoulder Exercises: Pulleys   Other Pulley Exercises  standing IR x 2 min      Shoulder Exercises: ROM/Strengthening   UBE (Upper Arm Bike)  L3 x 6 min (3 min fwd/3 min bwd)    Other ROM/Strengthening Exercises  UE Ranger  L 25 15x flexion 15x abduction       Shoulder Exercises: Stretch   Internal Rotation Stretch  5 reps 15 sec; standing with towel      Manual Therapy   Manual therapy comments  passive internal and external rotation 5x 20 sec holds             PT Education - 08/22/17 0930    Education provided  Yes    Education Details  IR stretch and pulley IR    Person(s) Educated  Patient    Methods  Explanation;Demonstration;Handout    Comprehension  Verbalized understanding;Returned demonstration       PT Short Term Goals - 08/22/17 0931      PT SHORT TERM GOAL #1   Title  The patient will demonstrate understanding of initial HEP and the importance of low load, long duration and frequent ROM    Status  Achieved      PT SHORT TERM GOAL #2   Title  The patient will report a 25% improvement in pain with usual ADLs including dressing and putting on a belt    Time  4    Period  Weeks    Status  On-going      PT SHORT TERM GOAL #3   Title  Right shoulder flexion/elevation  to 140 degrees for reaching above head level shelf    Status  Achieved      PT SHORT TERM GOAL #4   Title  Patient will be able to reach L4-5 region needed for putting on his belt    Baseline  3/28: Rt lateral glute; provided HEP to address limitations    Status  On-going        PT Long Term Goals - 08/01/17 1740      PT LONG TERM GOAL #1   Title  The patient will be independent in safe self progression of HEP needed for further functional improvements    Time  8    Period  Weeks    Status  New    Target Date  09/26/17      PT LONG TERM GOAL #2   Title  The patient will report a 60% reduction in pain with usual ADLs including reaching overhead and putting on his belt    Time  8    Period  Weeks    Status  New      PT LONG TERM GOAL #3   Title  The patient will have improved flexion/elevation/scaption to 150 degrees for reaching overhead shelves    Time  8    Period  Weeks    Status  New      PT LONG TERM GOAL #4   Title  The patient will have grossly 4+/5 shoulder and scapular strength needed to return to woodworking in his shop    Time  8    Period  Weeks    Status  New      PT LONG TERM GOAL #5   Title  FOTO functional outcome score improved from 50% limitation to 31% indicating improved function with less pain    Time  8    Period  Weeks    Status  New            Plan - 08/22/17 0931    Clinical Impression Statement  Pt tolerated session well today with continued difficulty with functional internal rotation and this time, which he reports is his main complaint.  Progressing  well with PT overall and able to tolerate strengthening exercises well.    Rehab Potential  Good    Clinical Impairments Affecting Rehab Potential  none;  shoulder injection 2/28; surgery 06/20/17    PT Frequency  2x / week    PT Duration  8 weeks    PT Treatment/Interventions  ADLs/Self Care Home Management;Electrical Stimulation;Cryotherapy;Ultrasound;Moist Heat;Iontophoresis 4mg /ml  Dexamethasone;Therapeutic exercise;Therapeutic activities;Patient/family education;Neuromuscular re-education;Manual techniques;Dry needling;Taping    PT Next Visit Plan   ROM for overhead and internal rotation;  manual therapy glenohumeral and scapular mobs;  UE Ranger on wall;   prone ex;  UBE;  look at HEP for IR    Consulted and Agree with Plan of Care  Patient       Patient will benefit from skilled therapeutic intervention in order to improve the following deficits and impairments:  Pain, Postural dysfunction, Decreased range of motion, Decreased strength, Impaired UE functional use, Impaired perceived functional ability, Hypomobility  Visit Diagnosis: Acute pain of right shoulder  Stiffness of right shoulder, not elsewhere classified  Muscle weakness (generalized)     Problem List Patient Active Problem List   Diagnosis Date Noted  . Status post arthroscopy of right shoulder 06/27/2017  . 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 Chronic right shoulder pain 04/16/2017  . Displaced fracture of proximal phalanx of right great toe with routine healing 03/13/2017  . Impingement syndrome of right shoulder 03/13/2017  . Displaced fracture of proximal phalanx of right great toe, initial encounter for closed fracture 02/20/2017  . Diabetes ([place]) 08/24/2013  . History of Holter monitoring   . Heart murmur   . TIA (transient ischemic attack) 12/26/2012  . Kidney stones   . DM 11/30/2009  . HYPERCHOLESTEROLEMIA 11/30/2009  . SMOKER 11/30/2009  . HYPERTENSION 11/30/2009  . URINARY CALCULUS 11/30/2009  . CAROTID BRUIT, RIGHT 11/30/2009      Laureen Abrahams, PT, DPT 08/22/17 9:33 AM    [facility] [address], [place], [place], [address] Phone: [phone]   Fax:  [phone]  Name: ARISTOTLE LIEB MRN: [id number] Date of Birth: 1951/04/26

## 2017-08-22 NOTE — Patient Instructions (Addendum)
Access Code: [email or web address]  URL: [email or web address]  Date: 08/22/2017  Prepared by: Faustino Congress   Exercises  Standing Shoulder Internal Rotation AAROM with Pulley - 10-15 reps - 1 sets - 5-10 sec hold - 2x daily - 7x weekly  Standing Shoulder Internal Rotation Stretch with Towel - 5 reps - 1 sets - 15 sec hold - 2x daily - 7x weekly

## 2017-08-23 ENCOUNTER — Encounter: Payer: Medicare Other | Admitting: Physical Therapy

## 2017-08-27 ENCOUNTER — Encounter: Payer: Self-pay | Admitting: Physical Therapy

## 2017-08-27 ENCOUNTER — Ambulatory Visit: Payer: Medicare Other | Attending: Orthopaedic Surgery | Admitting: Physical Therapy

## 2017-08-27 DIAGNOSIS — M25511 Pain in right shoulder: Secondary | ICD-10-CM | POA: Insufficient documentation

## 2017-08-27 DIAGNOSIS — M6281 Muscle weakness (generalized): Secondary | ICD-10-CM | POA: Insufficient documentation

## 2017-08-27 DIAGNOSIS — M25611 Stiffness of right shoulder, not elsewhere classified: Secondary | ICD-10-CM | POA: Diagnosis not present

## 2017-08-27 NOTE — Therapy (Signed)
[facility] [address] [place], [place], [address] Phone: [phone]   Fax:  [phone]  Physical Therapy Treatment  Patient Details  Name: Luis Lindsey MRN: [id number] Date of Birth: [birth year] Referring Provider: Dr. Ninfa Linden   Encounter Date: 08/27/2017  PT End of Session - 08/27/17 1055    Visit Number  8    Date for PT Re-Evaluation  09/26/17    Authorization Type  Medicare:  KX at visit 15    PT Start Time  1015    PT Stop Time  1055    PT Time Calculation (min)  40 min    Activity Tolerance  Patient tolerated treatment well       Past Medical History:  Diagnosis Date  . BCC (basal cell carcinoma)   . BPH (benign prostatic hyperplasia)   . CAROTID BRUIT, RIGHT 11/30/2009  . DM 11/30/2009  . Heart murmur   . History of Holter monitoring 2009  . HYPERCHOLESTEROLEMIA 11/30/2009  . HYPERTENSION 11/30/2009  . Kidney stones   . Right carotid bruit   . SMOKER 11/30/2009  . URINARY CALCULUS 11/30/2009    Past Surgical History:  Procedure Laterality Date  . CYSTOSCOPY    . FACIAL COSMETIC SURGERY  2014  . FINGER SURGERY  2002  . NECK SURGERY  1986  . ROTATOR CUFF REPAIR Left   . UMBILICAL HERNIA REPAIR      There were no vitals filed for this visit.  Subjective Assessment - 08/27/17 1025    Subjective  Just the behind the back motion is painful.  Sleeping pretty good.  Overhead motions just hurt every once in a while.      Pertinent History  Had left shoulder surgery for rotator cuff repair 3 years ago with good motion;  right scope surgery SAD, SLAP tear debride 06/20/17;  MRI showed tendonosis but rotator cuff intact    Currently in Pain?  No/denies    Pain Score  0-No pain    Aggravating Factors   reaching behind the back          [facility] PT Assessment - 08/27/17 0001      AROM   Right Shoulder Flexion  150 Degrees    Right Shoulder ABduction  158 Degrees    Right Shoulder Internal Rotation  -- S1     Right Shoulder External Rotation  72 Degrees                   [facility] Adult PT Treatment/Exercise - 08/27/17 0001      Therapeutic Activites    ADL's  reaching, pushing, pulling right UE      Shoulder Exercises: Prone   Extension  Strengthening;Right;15 reps;Weights    Extension Weight (lbs)  2    Horizontal ABduction 1  Strengthening;Right;15 reps;Weights    Horizontal ABduction 1 Weight (lbs)  2    Horizontal ABduction 2  AROM;Right;15 reps;Weights scaption small range    Horizontal ABduction 2 Weight (lbs)  2      Shoulder Exercises: Sidelying   Other Sidelying Exercises  12/6:00 x 10 3#    Other Sidelying Exercises  9:00/3:00 x10 3#       Shoulder Exercises: Standing   Other Standing Exercises  small arcs wall walking yellow band 12x      Shoulder Exercises: ROM/Strengthening   Other ROM/Strengthening Exercises  UE Ranger L 24 20x flexion    Other ROM/Strengthening Exercises  UE Ranger for internal  rotation on wall 10x      Shoulder Exercises: Power [employer]  15 reps 20# seated     Other Power Tower Exercises  Lat bar 15x 20# seated      Manual Therapy   Manual therapy comments  passive internal and external rotation 5x 20 sec holds    Joint Mobilization  --    Soft tissue mobilization  pectoral, upper trap    Scapular Mobilization  medial and lateral glides        3 way wall slides with towel 15x        PT Short Term Goals - 08/27/17 1325      PT SHORT TERM GOAL #1   Title  The patient will demonstrate understanding of initial HEP and the importance of low load, long duration and frequent ROM    Status  Achieved      PT SHORT TERM GOAL #2   Title  The patient will report a 25% improvement in pain with usual ADLs including dressing and putting on a belt    Time  4    Period  Weeks    Status  On-going      PT SHORT TERM GOAL #3   Title  Right shoulder flexion/elevation to 140 degrees for reaching above head level shelf    Status   Achieved      PT SHORT TERM GOAL #4   Title  Patient will be able to reach L4-5 region needed for putting on his belt    Time  4    Period  Weeks    Status  On-going        PT Long Term Goals - 08/01/17 1740      PT LONG TERM GOAL #1   Title  The patient will be independent in safe self progression of HEP needed for further functional improvements    Time  8    Period  Weeks    Status  New    Target Date  09/26/17      PT LONG TERM GOAL #2   Title  The patient will report a 60% reduction in pain with usual ADLs including reaching overhead and putting on his belt    Time  8    Period  Weeks    Status  New      PT LONG TERM GOAL #3   Title  The patient will have improved flexion/elevation/scaption to 150 degrees for reaching overhead shelves    Time  8    Period  Weeks    Status  New      PT LONG TERM GOAL #4   Title  The patient will have grossly 4+/5 shoulder and scapular strength needed to return to woodworking in his shop    Time  8    Period  Weeks    Status  New      PT LONG TERM GOAL #5   Title  FOTO functional outcome score improved from 50% limitation to 31% indicating improved function with less pain    Time  8    Period  Weeks    Status  New            Plan - 08/27/17 1312    Clinical Impression Statement  The patient is improving with right shoulder ROM and strength.  Decreased pain with reaching overhead but internal rotation (behind the back to put on his belt) is still stiff and painful.  Therapist providing verbal and tactile cues for scapular mobility with UE movements.    On track to meet rehab goals.      Rehab Potential  Good    Clinical Impairments Affecting Rehab Potential  none;  shoulder injection 2/28; surgery 06/20/17    PT Frequency  2x / week    PT Duration  8 weeks    PT Treatment/Interventions  ADLs/Self Care Home Management;Electrical Stimulation;Cryotherapy;Ultrasound;Moist Heat;Iontophoresis 4mg /ml Dexamethasone;Therapeutic  exercise;Therapeutic activities;Patient/family education;Neuromuscular re-education;Manual techniques;Dry needling;Taping    PT Next Visit Plan   see how MD appt went;  check FOTO; check STGs next visit;   ROM for overhead and internal rotation;  manual therapy glenohumeral and scapular mobs;  UE Ranger on wall;   prone ex;  UBE;         Patient will benefit from skilled therapeutic intervention in order to improve the following deficits and impairments:  Pain, Postural dysfunction, Decreased range of motion, Decreased strength, Impaired UE functional use, Impaired perceived functional ability, Hypomobility  Visit Diagnosis: Acute pain of right shoulder  Stiffness of right shoulder, not elsewhere classified  Muscle weakness (generalized)     Problem List Patient Active Problem List   Diagnosis Date Noted  . Status post arthroscopy of right shoulder 06/27/2017  . 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 Chronic right shoulder pain 04/16/2017  . Displaced fracture of proximal phalanx of right great toe with routine healing 03/13/2017  . Impingement syndrome of right shoulder 03/13/2017  . Displaced fracture of proximal phalanx of right great toe, initial encounter for closed fracture 02/20/2017  . Diabetes ([place]) 08/24/2013  . History of Holter monitoring   . Heart murmur   . TIA (transient ischemic attack) 12/26/2012  . Kidney stones   . DM 11/30/2009  . HYPERCHOLESTEROLEMIA 11/30/2009  . SMOKER 11/30/2009  . HYPERTENSION 11/30/2009  . URINARY CALCULUS 11/30/2009  . CAROTID BRUIT, RIGHT 11/30/2009   Ruben Im, PT 08/27/17 1:27 PM Phone: [phone] Fax: [phone]  Alvera Singh 08/27/2017, 1:26 PM  [facility] [address] [place], [place], [address] Phone: [phone]   Fax:  [phone]  Name: ROSHARD REZABEK MRN: [id number] Date of Birth: 1951-04-30

## 2017-08-30 ENCOUNTER — Ambulatory Visit: Payer: Medicare Other | Admitting: Physical Therapy

## 2017-08-30 ENCOUNTER — Encounter: Payer: Self-pay | Admitting: Physical Therapy

## 2017-08-30 DIAGNOSIS — M6281 Muscle weakness (generalized): Secondary | ICD-10-CM | POA: Diagnosis not present

## 2017-08-30 DIAGNOSIS — M25611 Stiffness of right shoulder, not elsewhere classified: Secondary | ICD-10-CM

## 2017-08-30 DIAGNOSIS — M25511 Pain in right shoulder: Secondary | ICD-10-CM | POA: Diagnosis not present

## 2017-08-30 NOTE — Therapy (Signed)
[facility] [address] [place], [place], [address] Phone: [phone]   Fax:  [phone]  Physical Therapy Treatment  Patient Details  Name: Luis Lindsey MRN: [id number] Date of Birth: 12-25-1950 Referring Provider: Dr. Ninfa Linden   Encounter Date: 08/30/2017  PT End of Session - 08/30/17 1059    Visit Number  9    Date for PT Re-Evaluation  09/26/17    Authorization Type  Medicare:  KX at visit 15    PT Start Time  1015    PT Stop Time  1057    PT Time Calculation (min)  42 min    Activity Tolerance  Patient tolerated treatment well       Past Medical History:  Diagnosis Date  . BCC (basal cell carcinoma)   . BPH (benign prostatic hyperplasia)   . CAROTID BRUIT, RIGHT 11/30/2009  . DM 11/30/2009  . Heart murmur   . History of Holter monitoring 2009  . HYPERCHOLESTEROLEMIA 11/30/2009  . HYPERTENSION 11/30/2009  . Kidney stones   . Right carotid bruit   . SMOKER 11/30/2009  . URINARY CALCULUS 11/30/2009    Past Surgical History:  Procedure Laterality Date  . CYSTOSCOPY    . FACIAL COSMETIC SURGERY  2014  . FINGER SURGERY  2002  . NECK SURGERY  1986  . ROTATOR CUFF REPAIR Left   . UMBILICAL HERNIA REPAIR      There were no vitals filed for this visit.  Subjective Assessment - 08/30/17 1017    Subjective  Shoulder is feeling "somewhat Ok".  Achey stiff.  Got dates confused and will see MD next week.  Difficulty starting the lawnmower.      Currently in Pain?  Yes    Pain Score  3     Pain Location  Shoulder    Pain Orientation  Right    Pain Type  Chronic pain    Aggravating Factors   weather, reaching behind the back          [facility] PT Assessment - 08/30/17 0001      Observation/Other Assessments   Focus on Therapeutic Outcomes (FOTO)   44%                   [facility] Adult PT Treatment/Exercise - 08/30/17 0001      Therapeutic Activites    ADL's  reaching, pushing, pulling right UE       Shoulder Exercises: Standing   Flexion  Strengthening;Right;15 reps;Theraband    Extension  Strengthening;Both;20 reps;Theraband    Theraband Level (Shoulder Extension)  Level 3 (Green)    Diagonals  Strengthening;Right;20 reps;Theraband extension D1/D2    Theraband Level (Shoulder Diagonals)  Level 3 (Green)    Other Standing Exercises  yellow band scaption to 90 degrees 10x 5 sec holds    Other Standing Exercises  yellow band 3 ways on wall 15x      Shoulder Exercises: Pulleys   Other Pulley Exercises  standing IR x 1 min      Shoulder Exercises: ROM/Strengthening   UBE (Upper Arm Bike)  L2 4 min     Rhythmic Stabilization, Seated  Body Blade 3 positions 30 sec each    Graduated Retraction with Theraband  counter top side stepping red band 15x    Other ROM/Strengthening Exercises  UE Ranger L 25 25x lexion    Other ROM/Strengthening Exercises  green band press forward 15x      Manual Therapy  Manual therapy comments  passive internal and external rotation 5x 20 sec holds    Soft tissue mobilization  pectoral, upper trap    Scapular Mobilization  medial and lateral glides                PT Short Term Goals - 08/30/17 1022      PT SHORT TERM GOAL #1   Title  The patient will demonstrate understanding of initial HEP and the importance of low load, long duration and frequent ROM    Status  Achieved      PT SHORT TERM GOAL #2   Title  The patient will report a 25% improvement in pain with usual ADLs including dressing and putting on a belt    Baseline  50% better    Status  Achieved      PT SHORT TERM GOAL #3   Title  Right shoulder flexion/elevation to 140 degrees for reaching above head level shelf    Status  Achieved      PT SHORT TERM GOAL #4   Title  Patient will be able to reach L4-5 region needed for putting on his belt    Status  Achieved        PT Long Term Goals - 08/01/17 1740      PT LONG TERM GOAL #1   Title  The patient will be independent in  safe self progression of HEP needed for further functional improvements    Time  8    Period  Weeks    Status  New    Target Date  09/26/17      PT LONG TERM GOAL #2   Title  The patient will report a 60% reduction in pain with usual ADLs including reaching overhead and putting on his belt    Time  8    Period  Weeks    Status  New      PT LONG TERM GOAL #3   Title  The patient will have improved flexion/elevation/scaption to 150 degrees for reaching overhead shelves    Time  8    Period  Weeks    Status  New      PT LONG TERM GOAL #4   Title  The patient will have grossly 4+/5 shoulder and scapular strength needed to return to woodworking in his shop    Time  8    Period  Weeks    Status  New      PT LONG TERM GOAL #5   Title  FOTO functional outcome score improved from 50% limitation to 31% indicating improved function with less pain    Time  8    Period  Weeks    Status  New            Plan - 08/30/17 1155    Clinical Impression Statement  The patient reports he is 50% better overall.  His FOTO functional outcome score has improved from 50% limitation to 44%.  Internal rotation continues to be the most limited ROM.  Able to perform a progression of flexion/scaption elevation strengthening with no exacerbation of pain.  Minimal upper trap compensation.  Therapist closely monitoring response throughout treatment session.  Patient states at end of treatment session, "I feel better than when I came in."      Rehab Potential  Good    Clinical Impairments Affecting Rehab Potential  none;  shoulder injection 2/28; surgery 06/20/17    PT Frequency  2x /  week    PT Duration  8 weeks    PT Treatment/Interventions  ADLs/Self Care Home Management;Electrical Stimulation;Cryotherapy;Ultrasound;Moist Heat;Iontophoresis 4mg /ml Dexamethasone;Therapeutic exercise;Therapeutic activities;Patient/family education;Neuromuscular re-education;Manual techniques;Dry needling;Taping    PT Next  Visit Plan   recheck ROM for MD appt;  flexion/scaption strengthening;   ROM for overhead and internal rotation;  manual therapy glenohumeral and scapular mobs;  UE Ranger on wall;   prone ex;  UBE;         Patient will benefit from skilled therapeutic intervention in order to improve the following deficits and impairments:  Pain, Postural dysfunction, Decreased range of motion, Decreased strength, Impaired UE functional use, Impaired perceived functional ability, Hypomobility  Visit Diagnosis: Acute pain of right shoulder  Stiffness of right shoulder, not elsewhere classified  Muscle weakness (generalized)     Problem List Patient Active Problem List   Diagnosis Date Noted  . Status post arthroscopy of right shoulder 06/27/2017  . 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 Chronic right shoulder pain 04/16/2017  . Displaced fracture of proximal phalanx of right great toe with routine healing 03/13/2017  . Impingement syndrome of right shoulder 03/13/2017  . Displaced fracture of proximal phalanx of right great toe, initial encounter for closed fracture 02/20/2017  . Diabetes ([place]) 08/24/2013  . History of Holter monitoring   . Heart murmur   . TIA (transient ischemic attack) 12/26/2012  . Kidney stones   . DM 11/30/2009  . HYPERCHOLESTEROLEMIA 11/30/2009  . SMOKER 11/30/2009  . HYPERTENSION 11/30/2009  . URINARY CALCULUS 11/30/2009  . CAROTID BRUIT, RIGHT 11/30/2009   Ruben Im, PT 08/30/17 12:00 PM Phone: [phone] Fax: [phone]  Alvera Singh 08/30/2017, 12:00 PM  [facility] [address], [place], [place], [address] Phone: [phone]   Fax:  [phone]  Name: BENJAMINE STROUT MRN: [id number] Date of Birth: June 15, 1950

## 2017-09-03 ENCOUNTER — Encounter: Payer: Self-pay | Admitting: Physical Therapy

## 2017-09-03 ENCOUNTER — Ambulatory Visit: Payer: Medicare Other | Admitting: Physical Therapy

## 2017-09-03 DIAGNOSIS — M25511 Pain in right shoulder: Secondary | ICD-10-CM | POA: Diagnosis not present

## 2017-09-03 DIAGNOSIS — M25611 Stiffness of right shoulder, not elsewhere classified: Secondary | ICD-10-CM

## 2017-09-03 DIAGNOSIS — M6281 Muscle weakness (generalized): Secondary | ICD-10-CM

## 2017-09-03 NOTE — Therapy (Signed)
[facility] [address] [place], [place], [address] Phone: [phone]   Fax:  [phone]  Physical Therapy Treatment  Patient Details  Name: Luis Lindsey MRN: [id number] Date of Birth: 12/11/1950 Referring Provider: Dr. Ninfa Linden   Encounter Date: 09/03/2017  PT End of Session - 09/03/17 1910    Visit Number  10    Date for PT Re-Evaluation  09/26/17    Authorization Type  Medicare:  KX at visit 15    PT Start Time  1015    PT Stop Time  1058    PT Time Calculation (min)  43 min    Activity Tolerance  Patient tolerated treatment well       Past Medical History:  Diagnosis Date  . BCC (basal cell carcinoma)   . BPH (benign prostatic hyperplasia)   . CAROTID BRUIT, RIGHT 11/30/2009  . DM 11/30/2009  . Heart murmur   . History of Holter monitoring 2009  . HYPERCHOLESTEROLEMIA 11/30/2009  . HYPERTENSION 11/30/2009  . Kidney stones   . Right carotid bruit   . SMOKER 11/30/2009  . URINARY CALCULUS 11/30/2009    Past Surgical History:  Procedure Laterality Date  . CYSTOSCOPY    . FACIAL COSMETIC SURGERY  2014  . FINGER SURGERY  2002  . NECK SURGERY  1986  . ROTATOR CUFF REPAIR Left   . UMBILICAL HERNIA REPAIR      There were no vitals filed for this visit.  Subjective Assessment - 09/03/17 1015    Subjective  Going to the doctor on Thursday.  I think this is just going to take time.  Able to wash hair with both hands now.  Can start the lawnmover now.  Can't start the weedeater yet.  Reaching to get shoes out from under the bed is difficult.      Currently in Pain?  No/denies    Pain Score  0-No pain    Pain Location  Shoulder    Pain Type  Chronic pain       Progress Note Reporting Period 08/01/17 to 09/03/17  See note below for Objective Data and Assessment of Progress/Goals.       [facility] PT Assessment - 09/03/17 0001      AROM   Right Shoulder Flexion  159 Degrees    Right Shoulder ABduction  164  Degrees    Right Shoulder Internal Rotation  -- L4-5 with effort    Right Shoulder External Rotation  72 Degrees      Strength   Right Shoulder Flexion  4/5    Right Shoulder ABduction  4/5    Right Shoulder Internal Rotation  4+/5    Right Shoulder External Rotation  4/5    Right Shoulder Horizontal ABduction  4-/5           FOTO 44% limitation        [facility] Adult PT Treatment/Exercise - 09/03/17 0001      Therapeutic Activites    ADL's  reaching, pushing, pulling right UE      Shoulder Exercises: Standing   Other Standing Exercises  red band scaption on wall 15x    Other Standing Exercises  red band wall walks 15x      Shoulder Exercises: ROM/Strengthening   UBE (Upper Arm Bike)  L2 4 min     Other ROM/Strengthening Exercises  red plyo ball 2# chops and overhead      Shoulder Exercises: Power [employer]  20 reps 25# horizontal and vertical     Other Power [employer] Exercises  Lat bar 25x 20# seated      Manual Therapy   Manual therapy comments  passive internal and external rotation 5x 20 sec holds    Soft tissue mobilization  pectoral, upper trap    Scapular Mobilization  medial and lateral glides                PT Short Term Goals - 09/03/17 1929      PT SHORT TERM GOAL #1   Title  The patient will demonstrate understanding of initial HEP and the importance of low load, long duration and frequent ROM    Status  Achieved      PT SHORT TERM GOAL #2   Title  The patient will report a 25% improvement in pain with usual ADLs including dressing and putting on a belt    Status  Achieved      PT SHORT TERM GOAL #3   Title  Right shoulder flexion/elevation to 140 degrees for reaching above head level shelf    Status  Achieved      PT SHORT TERM GOAL #4   Title  Patient will be able to reach L4-5 region needed for putting on his belt    Status  Achieved        PT Long Term Goals - 09/03/17 1930      PT LONG TERM GOAL #1   Title  The patient will  be independent in safe self progression of HEP needed for further functional improvements    Time  8    Period  Weeks    Status  On-going      PT LONG TERM GOAL #2   Title  The patient will report a 60% reduction in pain with usual ADLs including reaching overhead and putting on his belt    Status  Achieved      PT LONG TERM GOAL #3   Title  The patient will have improved flexion/elevation/scaption to 150 degrees for reaching overhead shelves    Status  Achieved      PT LONG TERM GOAL #4   Title  The patient will have grossly 4+/5 shoulder and scapular strength needed to return to woodworking in his shop    Time  8    Period  Weeks    Status  On-going      PT LONG TERM GOAL #5   Title  FOTO functional outcome score improved from 50% limitation to 31% indicating improved function with less pain    Time  8    Period  Weeks    Status  On-going            Plan - 09/03/17 1910    Clinical Impression Statement  Patient states he is about "60-65% of the way there.  He demonstrates significant improvements in shoulder ROM especially abduction.  He has also improved with internal rotation  ROM but it continues to be his most limited motion.  He strength has also improved significantly but with continued weakness with shoulder abduction.  He has returned to the majority of ADLs.  He reports he can start the lawnmower now but lacks strength with shoulder abduction.  Good progress with rehab goals. Min verbal cues still needed for correct thumb position to reduce impingement and to appropriately progress his HEP.   He should meet remaining goals in next 3-4 visits.  Rehab Potential  Good    Clinical Impairments Affecting Rehab Potential  none;  shoulder injection 2/28; surgery 06/20/17    PT Frequency  2x / week    PT Duration  8 weeks    PT Next Visit Plan   see how  MD appt;  flexion/scaption strengthening;   ROM for overhead and internal rotation;  manual therapy glenohumeral and  scapular mobs;  UE Ranger on wall;   prone ex;  UBE;         Patient will benefit from skilled therapeutic intervention in order to improve the following deficits and impairments:  Pain, Postural dysfunction, Decreased range of motion, Decreased strength, Impaired UE functional use, Impaired perceived functional ability, Hypomobility  Visit Diagnosis: Acute pain of right shoulder  Stiffness of right shoulder, not elsewhere classified  Muscle weakness (generalized)     Problem List Patient Active Problem List   Diagnosis Date Noted  . Status post arthroscopy of right shoulder 06/27/2017  . 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 Chronic right shoulder pain 04/16/2017  . Displaced fracture of proximal phalanx of right great toe with routine healing 03/13/2017  . Impingement syndrome of right shoulder 03/13/2017  . Displaced fracture of proximal phalanx of right great toe, initial encounter for closed fracture 02/20/2017  . Diabetes ([place]) 08/24/2013  . History of Holter monitoring   . Heart murmur   . TIA (transient ischemic attack) 12/26/2012  . Kidney stones   . DM 11/30/2009  . HYPERCHOLESTEROLEMIA 11/30/2009  . SMOKER 11/30/2009  . HYPERTENSION 11/30/2009  . URINARY CALCULUS 11/30/2009  . CAROTID BRUIT, RIGHT 11/30/2009   Ruben Im, PT 09/03/17 7:35 PM Phone: [phone] Fax: [phone]  Alvera Singh 09/03/2017, 7:34 PM  [facility] [address], [place], [place], [address] Phone: [phone]   Fax:  [phone]  Name: Luis Lindsey MRN: [id number] Date of Birth: [birth year]

## 2017-09-05 ENCOUNTER — Encounter (INDEPENDENT_AMBULATORY_CARE_PROVIDER_SITE_OTHER): Payer: Self-pay | Admitting: Orthopaedic Surgery

## 2017-09-05 ENCOUNTER — Ambulatory Visit (INDEPENDENT_AMBULATORY_CARE_PROVIDER_SITE_OTHER): Payer: Medicare Other | Admitting: Orthopaedic Surgery

## 2017-09-05 DIAGNOSIS — Z9889 Other specified postprocedural states: Secondary | ICD-10-CM

## 2017-09-05 NOTE — Progress Notes (Signed)
The patient is now between 2 and 3 months status post a right shoulder arthroscopy with subacromial decompression debridement.  He is been going through extensive therapy to get his shoulder moving.  He 67 years old.  He does state that he still stiff in the shoulder but his pain is not as much and is making progress with mobility he would like to make more progress.  He has about 1 more week of physical therapy.  On exam his internal rotation with adduction of the right shoulder still just barely above the belt line but that is better than what he was before.  His forward flexion is also limited in terms of about lacking 10 degrees of full forward flexion and abduction.  At this point I will have him perform daily exercises on his own and complete 1 more week of physical therapy.  He is a perfect candidate for an intra-articular right shoulder steroid injection under direct fluoroscopy by Dr. Ernestina Patches.  We will set that up.  The patient is amenable to trying this as well.  I will see him back myself in 8 weeks.  Hopefully we can have that injection did not show up soon in the right shoulder glenohumeral joint.

## 2017-09-05 NOTE — Addendum Note (Signed)
Addended by: Precious Bard on: 09/05/2017 03:21 PM   Modules accepted: Orders

## 2017-09-06 ENCOUNTER — Ambulatory Visit: Payer: Medicare Other | Admitting: Physical Therapy

## 2017-09-06 ENCOUNTER — Encounter: Payer: Self-pay | Admitting: Physical Therapy

## 2017-09-06 DIAGNOSIS — M25611 Stiffness of right shoulder, not elsewhere classified: Secondary | ICD-10-CM | POA: Diagnosis not present

## 2017-09-06 DIAGNOSIS — M25511 Pain in right shoulder: Secondary | ICD-10-CM

## 2017-09-06 DIAGNOSIS — M6281 Muscle weakness (generalized): Secondary | ICD-10-CM | POA: Diagnosis not present

## 2017-09-06 NOTE — Therapy (Signed)
[facility] [address] [place], [place], [address] Phone: [phone]   Fax:  [phone]  Physical Therapy Treatment  Patient Details  Name: Luis Lindsey MRN: [id number] Date of Birth: 1951/02/03 Referring Provider: Dr. Ninfa Linden   Encounter Date: 09/06/2017  PT End of Session - 09/06/17 1153    Visit Number  11    Date for PT Re-Evaluation  09/26/17    Authorization Type  Medicare:  KX at visit 15    PT Start Time  1015    PT Stop Time  1055    PT Time Calculation (min)  40 min    Activity Tolerance  Patient tolerated treatment well       Past Medical History:  Diagnosis Date  . BCC (basal cell carcinoma)   . BPH (benign prostatic hyperplasia)   . CAROTID BRUIT, RIGHT 11/30/2009  . DM 11/30/2009  . Heart murmur   . History of Holter monitoring 2009  . HYPERCHOLESTEROLEMIA 11/30/2009  . HYPERTENSION 11/30/2009  . Kidney stones   . Right carotid bruit   . SMOKER 11/30/2009  . URINARY CALCULUS 11/30/2009    Past Surgical History:  Procedure Laterality Date  . CYSTOSCOPY    . FACIAL COSMETIC SURGERY  2014  . FINGER SURGERY  2002  . NECK SURGERY  1986  . ROTATOR CUFF REPAIR Left   . UMBILICAL HERNIA REPAIR      There were no vitals filed for this visit.  Subjective Assessment - 09/06/17 1017    Subjective  The doctor recommended an injection to my shoulder b/c he doesn't think I'm far enough along.  He told me to do 1 more week of PT.      Pertinent History  Had left shoulder surgery for rotator cuff repair 3 years ago with good motion;  right scope surgery SAD, SLAP tear debride 06/20/17;  MRI showed tendonosis but rotator cuff intact    Currently in Pain?  No/denies    Pain Score  0-No pain    Pain Orientation  Right    Pain Type  Chronic pain                       [facility] Adult PT Treatment/Exercise - 09/06/17 0001      Therapeutic Activites    ADL's  reaching, pushing, pulling right UE       Shoulder Exercises: Standing   Other Standing Exercises  3 ways on wall 10x    Other Standing Exercises  single arm push ups 20x      Shoulder Exercises: Therapy Ball   Other Therapy Ball Exercises  up wall for flexion 15x      Shoulder Exercises: ROM/Strengthening   UBE (Upper Arm Bike)  L2 4 min       Shoulder Exercises: Stretch   Table Stretch - Abduction  -- on wall pec stretch 7x    Other Shoulder Stretches  sidelying sleeper stretch 3x30 sec    Other Shoulder Stretches  standing modified sleepr stretch       Shoulder Exercises: Power [employer]  25 reps 25# horizontal and vertical and 1 set of 10 with step back       Manual Therapy   Manual therapy comments  passive internal and external rotation 5x 20 sec holds    Soft tissue mobilization  pectoral, upper trap    Scapular Mobilization  medial and lateral glides  Grade 3/4  PT Education - 09/06/17 1152    Education provided  Yes    Education Details  sleeper stretch sidelying and on wall    Person(s) Educated  Patient    Methods  Explanation;Demonstration;Handout    Comprehension  Returned demonstration;Verbalized understanding       PT Short Term Goals - 09/03/17 1929      PT SHORT TERM GOAL #1   Title  The patient will demonstrate understanding of initial HEP and the importance of low load, long duration and frequent ROM    Status  Achieved      PT SHORT TERM GOAL #2   Title  The patient will report a 25% improvement in pain with usual ADLs including dressing and putting on a belt    Status  Achieved      PT SHORT TERM GOAL #3   Title  Right shoulder flexion/elevation to 140 degrees for reaching above head level shelf    Status  Achieved      PT SHORT TERM GOAL #4   Title  Patient will be able to reach L4-5 region needed for putting on his belt    Status  Achieved        PT Long Term Goals - 09/03/17 1930      PT LONG TERM GOAL #1   Title  The patient will be independent in  safe self progression of HEP needed for further functional improvements    Time  8    Period  Weeks    Status  On-going      PT LONG TERM GOAL #2   Title  The patient will report a 60% reduction in pain with usual ADLs including reaching overhead and putting on his belt    Status  Achieved      PT LONG TERM GOAL #3   Title  The patient will have improved flexion/elevation/scaption to 150 degrees for reaching overhead shelves    Status  Achieved      PT LONG TERM GOAL #4   Title  The patient will have grossly 4+/5 shoulder and scapular strength needed to return to woodworking in his shop    Time  8    Period  Weeks    Status  On-going      PT LONG TERM GOAL #5   Title  FOTO functional outcome score improved from 50% limitation to 31% indicating improved function with less pain    Time  8    Period  Weeks    Status  On-going            Plan - 09/06/17 1153    Clinical Impression Statement  Treatment focus on shoulder ROM especially internal rotation and overhead flexion.  He has endrange stretching discomfort which resolves upon release of stretch.  Therapist closely monitoring response with all.  Should meet remaining goals in the next week.      Rehab Potential  Good    Clinical Impairments Affecting Rehab Potential  none;  shoulder injection 2/28; surgery 06/20/17    PT Frequency  2x / week    PT Duration  8 weeks    PT Treatment/Interventions  ADLs/Self Care Home Management;Electrical Stimulation;Cryotherapy;Ultrasound;Moist Heat;Iontophoresis 4mg /ml Dexamethasone;Therapeutic exercise;Therapeutic activities;Patient/family education;Neuromuscular re-education;Manual techniques;Dry needling;Taping    PT Next Visit Plan   flexion/scaption strengthening;   ROM for overhead and internal rotation;  manual therapy glenohumeral and scapular mobs;  UE Ranger on wall;   prone ex;  UBE;  anticipate readiness for  discharge in independent HEP in 1 week       Patient will benefit from  skilled therapeutic intervention in order to improve the following deficits and impairments:  Pain, Postural dysfunction, Decreased range of motion, Decreased strength, Impaired UE functional use, Impaired perceived functional ability, Hypomobility  Visit Diagnosis: Acute pain of right shoulder  Stiffness of right shoulder, not elsewhere classified  Muscle weakness (generalized)     Problem List Patient Active Problem List   Diagnosis Date Noted  . Status post arthroscopy of right shoulder 06/27/2017  . 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 Chronic right shoulder pain 04/16/2017  . Displaced fracture of proximal phalanx of right great toe with routine healing 03/13/2017  . Impingement syndrome of right shoulder 03/13/2017  . Displaced fracture of proximal phalanx of right great toe, initial encounter for closed fracture 02/20/2017  . Diabetes ([place]) 08/24/2013  . History of Holter monitoring   . Heart murmur   . TIA (transient ischemic attack) 12/26/2012  . Kidney stones   . DM 11/30/2009  . HYPERCHOLESTEROLEMIA 11/30/2009  . SMOKER 11/30/2009  . HYPERTENSION 11/30/2009  . URINARY CALCULUS 11/30/2009  . CAROTID BRUIT, RIGHT 11/30/2009   Ruben Im, PT 09/06/17 11:59 AM Phone: [phone] Fax: [phone]  Alvera Singh 09/06/2017, 11:58 AM  [facility] [address], [place], [place], [address] Phone: [phone]   Fax:  [phone]  Name: APOSTOLOS BLAGG MRN: [id number] Date of Birth: [birth year]

## 2017-09-06 NOTE — Patient Instructions (Signed)
  Ruben Im PT [facility] [address], [place], [place] [address] Phone # [phone] Fax [phone]    Access Code: [facility]  URL: [email or web address]  Date: 09/06/2017  Prepared by: Ruben Im   Exercises  Sleeper Stretch - 3 reps - 1 sets - 20 hold - 1x daily - 7x weekly  Modified Standing Sleeper Stretch at [place] 3 reps - 1 sets - 20 hold - 1x daily - 7x weekly

## 2017-09-10 ENCOUNTER — Ambulatory Visit: Payer: Medicare Other

## 2017-09-10 DIAGNOSIS — M25611 Stiffness of right shoulder, not elsewhere classified: Secondary | ICD-10-CM | POA: Diagnosis not present

## 2017-09-10 DIAGNOSIS — M6281 Muscle weakness (generalized): Secondary | ICD-10-CM

## 2017-09-10 DIAGNOSIS — M25511 Pain in right shoulder: Secondary | ICD-10-CM | POA: Diagnosis not present

## 2017-09-10 NOTE — Therapy (Signed)
[facility] [address] [place], [place], [address] Phone: [phone]   Fax:  [phone]  Physical Therapy Treatment  Patient Details  Name: Luis Lindsey MRN: [id number] Date of Birth: 09/07/1950 Referring Provider: Dr. Ninfa Linden   Encounter Date: 09/10/2017  PT End of Session - 09/10/17 0848    Visit Number  12    Date for PT Re-Evaluation  09/26/17    Authorization Type  Medicare:  KX at visit 15    PT Start Time  0802    PT Stop Time  0845    PT Time Calculation (min)  43 min    Activity Tolerance  Patient tolerated treatment well    Behavior During Therapy  [facility] for tasks assessed/performed       Past Medical History:  Diagnosis Date  . BCC (basal cell carcinoma)   . BPH (benign prostatic hyperplasia)   . CAROTID BRUIT, RIGHT 11/30/2009  . DM 11/30/2009  . Heart murmur   . History of Holter monitoring 2009  . HYPERCHOLESTEROLEMIA 11/30/2009  . HYPERTENSION 11/30/2009  . Kidney stones   . Right carotid bruit   . SMOKER 11/30/2009  . URINARY CALCULUS 11/30/2009    Past Surgical History:  Procedure Laterality Date  . CYSTOSCOPY    . FACIAL COSMETIC SURGERY  2014  . FINGER SURGERY  2002  . NECK SURGERY  1986  . ROTATOR CUFF REPAIR Left   . UMBILICAL HERNIA REPAIR      There were no vitals filed for this visit.  Subjective Assessment - 09/10/17 0806    Subjective  I will get an injection tomorrow.  This is my last week.      Pertinent History  Had left shoulder surgery for rotator cuff repair 3 years ago with good motion;  right scope surgery SAD, SLAP tear debride 06/20/17;  MRI showed tendonosis but rotator cuff intact    Currently in Pain?  No/denies                       [facility] Adult PT Treatment/Exercise - 09/10/17 0001      Shoulder Exercises: Seated   Flexion  Strengthening;Right;20 reps;Weights    Flexion Weight (lbs)  2    Abduction  Strengthening;Right;20 reps;Weights    ABduction Weight (lbs)  2    Other Seated Exercises  scaption 2# 2x10      Shoulder Exercises: Standing   Other Standing Exercises  3 ways on wall 10x    Other Standing Exercises  [place] with green band 2x10      Shoulder Exercises: Pulleys   Other Pulley Exercises  IR with pulley x 2 minutes      Shoulder Exercises: Therapy Ball   Other Therapy Ball Exercises  up wall for flexion 15x      Shoulder Exercises: ROM/Strengthening   UBE (Upper Arm Bike)  L2 6 min  PT present to discuss progress      Shoulder Exercises: Power [employer]  25 reps 25# horizontal and vertical and 1 set of 10 with step back       Manual Therapy   Manual therapy comments  passive internal and external rotation 5x 20 sec holds    Soft tissue mobilization  pectoral, upper trap    Scapular Mobilization  medial and lateral glides                PT Short Term Goals - 09/03/17  1929      PT SHORT TERM GOAL #1   Title  The patient will demonstrate understanding of initial HEP and the importance of low load, long duration and frequent ROM    Status  Achieved      PT SHORT TERM GOAL #2   Title  The patient will report a 25% improvement in pain with usual ADLs including dressing and putting on a belt    Status  Achieved      PT SHORT TERM GOAL #3   Title  Right shoulder flexion/elevation to 140 degrees for reaching above head level shelf    Status  Achieved      PT SHORT TERM GOAL #4   Title  Patient will be able to reach L4-5 region needed for putting on his belt    Status  Achieved        PT Long Term Goals - 09/03/17 1930      PT LONG TERM GOAL #1   Title  The patient will be independent in safe self progression of HEP needed for further functional improvements    Time  8    Period  Weeks    Status  On-going      PT LONG TERM GOAL #2   Title  The patient will report a 60% reduction in pain with usual ADLs including reaching overhead and putting on his belt    Status  Achieved       PT LONG TERM GOAL #3   Title  The patient will have improved flexion/elevation/scaption to 150 degrees for reaching overhead shelves    Status  Achieved      PT LONG TERM GOAL #4   Title  The patient will have grossly 4+/5 shoulder and scapular strength needed to return to woodworking in his shop    Time  8    Period  Weeks    Status  On-going      PT LONG TERM GOAL #5   Title  FOTO functional outcome score improved from 50% limitation to 31% indicating improved function with less pain    Time  8    Period  Weeks    Status  On-going            Plan - 09/10/17 3536    Clinical Impression Statement  Pt with limited Rt shoulder IR and reports that he stretches into this motion daily.  Pt will have injection tomorrow into Rt shoulder.  Pt tolerated all exercises in the clinic without increased pain.  Minor verbal and tactile cues required to reduce scapular elevation.  PT issued green theraband for [place] exercises for advancement at home.  Pt will be discharged next session to HEP    Rehab Potential  Good    Clinical Impairments Affecting Rehab Potential  none;  shoulder injection 2/28; surgery 06/20/17    PT Frequency  2x / week    PT Duration  8 weeks    PT Treatment/Interventions  ADLs/Self Care Home Management;Electrical Stimulation;Cryotherapy;Ultrasound;Moist Heat;Iontophoresis 4mg /ml Dexamethasone;Therapeutic exercise;Therapeutic activities;Patient/family education;Neuromuscular re-education;Manual techniques;Dry needling;Taping    PT Next Visit Plan  final measurements.  Finalalize HEP as needed    PT Home Exercise Plan  access codes: [place]  initial certification is signed    Consulted and Agree with Plan of Care  Patient       Patient will benefit from skilled therapeutic intervention in order to improve the following deficits  and impairments:  Pain, Postural dysfunction, Decreased range of motion, Decreased strength, Impaired  UE functional use, Impaired perceived functional ability, Hypomobility  Visit Diagnosis: Acute pain of right shoulder  Stiffness of right shoulder, not elsewhere classified  Muscle weakness (generalized)     Problem List Patient Active Problem List   Diagnosis Date Noted  . Status post arthroscopy of right shoulder 06/27/2017  . 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 Chronic right shoulder pain 04/16/2017  . Displaced fracture of proximal phalanx of right great toe with routine healing 03/13/2017  . Impingement syndrome of right shoulder 03/13/2017  . Displaced fracture of proximal phalanx of right great toe, initial encounter for closed fracture 02/20/2017  . Diabetes ([place]) 08/24/2013  . History of Holter monitoring   . Heart murmur   . TIA (transient ischemic attack) 12/26/2012  . Kidney stones   . DM 11/30/2009  . HYPERCHOLESTEROLEMIA 11/30/2009  . SMOKER 11/30/2009  . HYPERTENSION 11/30/2009  . URINARY CALCULUS 11/30/2009  . CAROTID BRUIT, RIGHT 11/30/2009     Sigurd Sos, PT 09/10/17 8:50 AM  [facility] [address] [place], [place], [address] Phone: [phone]   Fax:  [phone]  Name: DRAVYN SEVERS MRN: [id number] Date of Birth: 10-30-1950

## 2017-09-11 ENCOUNTER — Ambulatory Visit (INDEPENDENT_AMBULATORY_CARE_PROVIDER_SITE_OTHER): Payer: Medicare Other | Admitting: Physical Medicine and Rehabilitation

## 2017-09-11 ENCOUNTER — Ambulatory Visit (INDEPENDENT_AMBULATORY_CARE_PROVIDER_SITE_OTHER): Payer: Medicare Other

## 2017-09-11 ENCOUNTER — Encounter (INDEPENDENT_AMBULATORY_CARE_PROVIDER_SITE_OTHER): Payer: Self-pay | Admitting: Physical Medicine and Rehabilitation

## 2017-09-11 DIAGNOSIS — G8929 Other chronic pain: Secondary | ICD-10-CM

## 2017-09-11 DIAGNOSIS — M25511 Pain in right shoulder: Secondary | ICD-10-CM | POA: Diagnosis not present

## 2017-09-11 MED ORDER — BUPIVACAINE HCL 0.5 % IJ SOLN
3.0000 mL | INTRAMUSCULAR | Status: AC | PRN
  Administered 2017-09-11: 3 mL via INTRA_ARTICULAR

## 2017-09-11 MED ORDER — TRIAMCINOLONE ACETONIDE 40 MG/ML IJ SUSP
80.0000 mg | INTRAMUSCULAR | Status: AC | PRN
Start: 2017-09-11 — End: 2017-09-11
  Administered 2017-09-11: 80 mg via INTRA_ARTICULAR

## 2017-09-11 NOTE — Progress Notes (Signed)
Luis Lindsey - 67 y.o. male MRN [id number]  Date of birth: Oct 06, 1950  Office Visit Note: Visit Date: 09/11/2017 PCP: Alroy Dust, [name], MD Referred by: Alroy Dust, [name], MD  Subjective: Chief Complaint  Patient presents with  . Right Shoulder - Pain  . Right Arm - Pain   HPI: Luis Lindsey is a 67 year old gentleman status post right shoulder arthroscopy and subacromial injection with continued right shoulder pain and decreased range of motion.  He has had extensive physical therapy.  We are going to complete a diagnostic and hopefully therapeutic anesthetic arthrogram of the glenohumeral joint on the right.   ROS Otherwise per HPI.  Assessment & Plan: Visit Diagnoses:  1. Chronic right shoulder pain     Plan: Findings:  Diagnostic note for therapeutic anesthetic arthrogram of the glenohumeral joint on the right.  Patient did have relief of his symptoms and increased range of motion pretty significantly with intra-articular anesthetic.    Meds & Orders: No orders of the defined types were placed in this encounter.   Orders Placed This Encounter  Procedures  . Large Joint Inj: R glenohumeral  . XR C-ARM NO REPORT    Follow-up: Return for Dr. Ninfa Linden.   Procedures: Large Joint Inj: R glenohumeral on 09/11/2017 11:15 AM Indications: pain and diagnostic evaluation Details: 22 G 3.5 in needle, anteromedial approach  Arthrogram: Yes  Medications: 3 mL bupivacaine 0.5 %; 80 mg triamcinolone acetonide 40 MG/ML  Arthrogram demonstrated excellent flow of contrast throughout the joint surface without extravasation or obvious defect.  The patient had relief of symptoms during the anesthetic phase of the injection.  Procedure, treatment alternatives, risks and benefits explained, specific risks discussed. Consent was given by the patient. Immediately prior to procedure a time out was called to verify the correct patient, procedure, equipment, support staff and site/side marked as  required. Patient was prepped and draped in the usual sterile fashion.      No notes on file   Clinical History: No specialty comments available.   He reports that he quit smoking about 4 years ago. His smoking use included cigarettes. He quit after 40.00 years of use. He has never used smokeless tobacco. No results for input(s): HGBA1C, LABURIC in the last 8760 hours.  Objective:  VS:  HT:    WT:   BMI:     BP:   HR: bpm  TEMP: ( )  RESP:  Physical Exam  Musculoskeletal:  Right shoulder lacks significant range of motion above parallel as well as really no decent motion behind his back.    Ortho Exam Imaging: Xr C-arm No Report  Result Date: 09/11/2017 Please see Notes or Procedures tab for imaging impression.   Past Medical/Family/Surgical/Social History: Medications & Allergies reviewed per EMR, new medications updated. Patient Active Problem List   Diagnosis Date Noted  . Status post arthroscopy of right shoulder 06/27/2017  . 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 Chronic right shoulder pain 04/16/2017  . Displaced fracture of proximal phalanx of right great toe with routine healing 03/13/2017  . Impingement syndrome of right shoulder 03/13/2017  . Displaced fracture of proximal phalanx of right great toe, initial encounter for closed fracture 02/20/2017  . Diabetes ([place]) 08/24/2013  . History of Holter monitoring   . Heart murmur   . TIA (transient ischemic attack) 12/26/2012  . Kidney stones   . DM 11/30/2009  . HYPERCHOLESTEROLEMIA 11/30/2009  . SMOKER 11/30/2009  . HYPERTENSION 11/30/2009  . URINARY  CALCULUS 11/30/2009  . CAROTID BRUIT, RIGHT 11/30/2009   Past Medical History:  Diagnosis Date  . BCC (basal cell carcinoma)   . BPH (benign prostatic hyperplasia)   . CAROTID BRUIT, RIGHT 11/30/2009  . DM 11/30/2009  . Heart murmur   . History of Holter monitoring 2009  . HYPERCHOLESTEROLEMIA 11/30/2009  . HYPERTENSION 11/30/2009  .  Kidney stones   . Right carotid bruit   . SMOKER 11/30/2009  . URINARY CALCULUS 11/30/2009   Family History  Problem Relation Age of Onset  . Diabetes Sister   . Diabetes Mellitus I Sister   . Stroke Father   . Heart disease Father   . CVA Father   . Coronary artery disease Father   . Diabetes Mother   . Heart disease Mother   . Diabetes Mellitus I Mother   . Coronary artery disease Mother   . Coronary artery disease Brother   . Coronary artery disease Brother    Past Surgical History:  Procedure Laterality Date  . CYSTOSCOPY    . FACIAL COSMETIC SURGERY  2014  . FINGER SURGERY  2002  . NECK SURGERY  1986  . ROTATOR CUFF REPAIR Left   . UMBILICAL HERNIA REPAIR     Social History   Occupational History  . Occupation: Truck Education administrator: [employer]  Tobacco Use  . Smoking status: Former Smoker    Years: 40.00    Types: Cigarettes    Last attempt to quit: 01/01/2013    Years since quitting: 4.6  . Smokeless tobacco: Never Used  . Tobacco comment: Quit 12-2012  Substance and Sexual Activity  . Alcohol use: No  . Drug use: No  . Sexual activity: Not on file

## 2017-09-11 NOTE — Progress Notes (Signed)
 .  Numeric Pain Rating Scale and Functional Assessment Average Pain 3   In the last MONTH (on 0-10 scale) has pain interfered with the following?  1. General activity like being  able to carry out your everyday physical activities such as walking, climbing stairs, carrying groceries, or moving a chair?  Rating(1)   +Driver, -BT, -Dye Allergies.  

## 2017-09-11 NOTE — Patient Instructions (Signed)

## 2017-09-12 ENCOUNTER — Ambulatory Visit: Payer: Medicare Other

## 2017-09-12 DIAGNOSIS — M6281 Muscle weakness (generalized): Secondary | ICD-10-CM | POA: Diagnosis not present

## 2017-09-12 DIAGNOSIS — M25611 Stiffness of right shoulder, not elsewhere classified: Secondary | ICD-10-CM

## 2017-09-12 DIAGNOSIS — M25511 Pain in right shoulder: Secondary | ICD-10-CM

## 2017-09-12 NOTE — Therapy (Signed)
[facility] [address] [place], [place], [address] Phone: [phone]   Fax:  [phone]  Physical Therapy Treatment  Patient Details  Name: Luis Lindsey MRN: [id number] Date of Birth: Oct 03, 1950 Referring Provider: Dr. Ninfa Linden   Encounter Date: 09/12/2017  PT End of Session - 09/12/17 0847    Visit Number  13    Date for PT Re-Evaluation  09/26/17    Authorization Type  Medicare:  KX at visit 15    PT Start Time  0800    PT Stop Time  0844    PT Time Calculation (min)  44 min    Activity Tolerance  Patient tolerated treatment well    Behavior During Therapy  [facility] for tasks assessed/performed       Past Medical History:  Diagnosis Date  . BCC (basal cell carcinoma)   . BPH (benign prostatic hyperplasia)   . CAROTID BRUIT, RIGHT 11/30/2009  . DM 11/30/2009  . Heart murmur   . History of Holter monitoring 2009  . HYPERCHOLESTEROLEMIA 11/30/2009  . HYPERTENSION 11/30/2009  . Kidney stones   . Right carotid bruit   . SMOKER 11/30/2009  . URINARY CALCULUS 11/30/2009    Past Surgical History:  Procedure Laterality Date  . CYSTOSCOPY    . FACIAL COSMETIC SURGERY  2014  . FINGER SURGERY  2002  . NECK SURGERY  1986  . ROTATOR CUFF REPAIR Left   . UMBILICAL HERNIA REPAIR      There were no vitals filed for this visit.  Subjective Assessment - 09/12/17 0812    Subjective  Pt had injection yesterday.  MD indicated that he should consider a little more PT since injection will allow for more activity.      Pertinent History  Had left shoulder surgery for rotator cuff repair 3 years ago with good motion;  right scope surgery SAD, SLAP tear debride 06/20/17;  MRI showed tendonosis but rotator cuff intact    Currently in Pain?  Yes    Pain Score  4     Pain Location  Shoulder    Pain Orientation  Right    Pain Descriptors / Indicators  Aching    Pain Type  Chronic pain    Pain Onset  More than a month ago    Pain  Frequency  Constant         [facility] PT Assessment - 09/12/17 0001      Assessment   Medical Diagnosis  right shoulder pain;  s/p arthroscopy      AROM   Right Shoulder Flexion  155 Degrees    Right Shoulder ABduction  135 Degrees    Right Shoulder Internal Rotation  -- lateral at L5 level    Right Shoulder External Rotation  -- to T2                   [facility] Adult PT Treatment/Exercise - 09/12/17 0001      Shoulder Exercises: Seated   Flexion  Strengthening;Right;20 reps;Weights    Flexion Weight (lbs)  2    Abduction  Strengthening;Right;20 reps;Weights    ABduction Weight (lbs)  2    Other Seated Exercises  scaption 2# 2x10      Shoulder Exercises: Standing   Other Standing Exercises  3 ways on wall 10x    Other Standing Exercises  --      Shoulder Exercises: Pulleys   Other Pulley Exercises  IR with pulley x  2 minutes      Shoulder Exercises: Therapy Ball   Other Therapy Ball Exercises  up wall for flexion 15x      Shoulder Exercises: ROM/Strengthening   UBE (Upper Arm Bike)  L2 6 min  PT present to discuss progress      Shoulder Exercises: Power [employer]  --      Manual Therapy   Manual therapy comments  passive internal and external rotation 5x 20 sec holds    Soft tissue mobilization  pectoral, upper trap    Scapular Mobilization  medial and lateral glides                PT Short Term Goals - 09/03/17 1929      PT SHORT TERM GOAL #1   Title  The patient will demonstrate understanding of initial HEP and the importance of low load, long duration and frequent ROM    Status  Achieved      PT SHORT TERM GOAL #2   Title  The patient will report a 25% improvement in pain with usual ADLs including dressing and putting on a belt    Status  Achieved      PT SHORT TERM GOAL #3   Title  Right shoulder flexion/elevation to 140 degrees for reaching above head level shelf    Status  Achieved      PT SHORT TERM GOAL #4   Title  Patient will be  able to reach L4-5 region needed for putting on his belt    Status  Achieved        PT Long Term Goals - 09/03/17 1930      PT LONG TERM GOAL #1   Title  The patient will be independent in safe self progression of HEP needed for further functional improvements    Time  8    Period  Weeks    Status  On-going      PT LONG TERM GOAL #2   Title  The patient will report a 60% reduction in pain with usual ADLs including reaching overhead and putting on his belt    Status  Achieved      PT LONG TERM GOAL #3   Title  The patient will have improved flexion/elevation/scaption to 150 degrees for reaching overhead shelves    Status  Achieved      PT LONG TERM GOAL #4   Title  The patient will have grossly 4+/5 shoulder and scapular strength needed to return to woodworking in his shop    Time  8    Period  Weeks    Status  On-going      PT LONG TERM GOAL #5   Title  FOTO functional outcome score improved from 50% limitation to 31% indicating improved function with less pain    Time  8    Period  Weeks    Status  On-going            Plan - 09/12/17 1610    Clinical Impression Statement  Pt had injection yesterday into Rt glenohumeral joint.  Pt will discuss with MD if he wants him to continue with PT for continued Rt shoulder stength and A/ROM progression.  Pt continues to demonstrate scapular elevatoin with Rt shoulder motion against gravity.  PT will place pt on hold until he talks to MD regarding continued PT.      Clinical Impairments Affecting Rehab Potential  none;  shoulder injection 2/28; surgery  06/20/17    PT Frequency  2x / week    PT Duration  8 weeks    PT Treatment/Interventions  ADLs/Self Care Home Management;Electrical Stimulation;Cryotherapy;Ultrasound;Moist Heat;Iontophoresis 4mg /ml Dexamethasone;Therapeutic exercise;Therapeutic activities;Patient/family education;Neuromuscular re-education;Manual techniques;Dry needling;Taping    PT Next Visit Plan  Continue to  advance strength and ROM of Rt shoulder. Order good until 09/26/17    Consulted and Agree with Plan of Care  Patient       Patient will benefit from skilled therapeutic intervention in order to improve the following deficits and impairments:     Visit Diagnosis: Acute pain of right shoulder  Stiffness of right shoulder, not elsewhere classified  Muscle weakness (generalized)     Problem List Patient Active Problem List   Diagnosis Date Noted  . Status post arthroscopy of right shoulder 06/27/2017  . 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 Chronic right shoulder pain 04/16/2017  . Displaced fracture of proximal phalanx of right great toe with routine healing 03/13/2017  . Impingement syndrome of right shoulder 03/13/2017  . Displaced fracture of proximal phalanx of right great toe, initial encounter for closed fracture 02/20/2017  . Diabetes ([place]) 08/24/2013  . History of Holter monitoring   . Heart murmur   . TIA (transient ischemic attack) 12/26/2012  . Kidney stones   . DM 11/30/2009  . HYPERCHOLESTEROLEMIA 11/30/2009  . SMOKER 11/30/2009  . HYPERTENSION 11/30/2009  . URINARY CALCULUS 11/30/2009  . CAROTID BRUIT, RIGHT 11/30/2009    Sigurd Sos, PT 09/12/17 8:48 AM  [facility] [address] [place], [place], [address] Phone: [phone]   Fax:  [phone]  Name: Luis Lindsey MRN: [id number] Date of Birth: [birth year]

## 2017-09-16 ENCOUNTER — Ambulatory Visit: Payer: Medicare Other

## 2017-09-16 DIAGNOSIS — M25511 Pain in right shoulder: Secondary | ICD-10-CM | POA: Diagnosis not present

## 2017-09-16 DIAGNOSIS — M25611 Stiffness of right shoulder, not elsewhere classified: Secondary | ICD-10-CM

## 2017-09-16 DIAGNOSIS — M6281 Muscle weakness (generalized): Secondary | ICD-10-CM | POA: Diagnosis not present

## 2017-09-16 NOTE — Therapy (Signed)
[facility] [address], [place], [place], [address] Phone: [phone]   Fax:  [phone]  Physical Therapy Treatment  Patient Details  Name: Luis Lindsey MRN: [id number] Date of Birth: [birth year] Referring Provider: Dr. Ninfa Linden   Encounter Date: 09/16/2017  PT End of Session - 09/16/17 0841    Visit Number  14    Date for PT Re-Evaluation  09/26/17    Authorization Type  Medicare:  KX at visit 15    PT Start Time  0758    PT Stop Time  0840    PT Time Calculation (min)  42 min    Activity Tolerance  Patient tolerated treatment well    Behavior During Therapy  [facility] for tasks assessed/performed       Past Medical History:  Diagnosis Date  . BCC (basal cell carcinoma)   . BPH (benign prostatic hyperplasia)   . CAROTID BRUIT, RIGHT 11/30/2009  . DM 11/30/2009  . Heart murmur   . History of Holter monitoring 2009  . HYPERCHOLESTEROLEMIA 11/30/2009  . HYPERTENSION 11/30/2009  . Kidney stones   . Right carotid bruit   . SMOKER 11/30/2009  . URINARY CALCULUS 11/30/2009    Past Surgical History:  Procedure Laterality Date  . CYSTOSCOPY    . FACIAL COSMETIC SURGERY  2014  . FINGER SURGERY  2002  . NECK SURGERY  1986  . ROTATOR CUFF REPAIR Left   . UMBILICAL HERNIA REPAIR      There were no vitals filed for this visit.  Subjective Assessment - 09/16/17 0758    Subjective  My shoulder is aching less now that I have had the injection.  I haven't noticed any additional movement.      Currently in Pain?  No/denies                       [facility] Adult PT Treatment/Exercise - 09/16/17 0001      Shoulder Exercises: Seated   Flexion  Strengthening;Right;20 reps;Weights    Flexion Weight (lbs)  2    Abduction  Strengthening;Right;20 reps;Weights    ABduction Weight (lbs)  2    Other Seated Exercises  scaption 2# 2x10      Shoulder Exercises: Standing   Other Standing Exercises  3 ways on wall 10x       Shoulder Exercises: Pulleys   Other Pulley Exercises  IR with pulley x 2 minutes      Shoulder Exercises: Therapy Ball   Other Therapy Ball Exercises  up wall for flexion 15x      Shoulder Exercises: ROM/Strengthening   UBE (Upper Arm Bike)  L2 6 min  PT present to discuss progress      Shoulder Exercises: Power [employer], community  25 reps 20# bil    Row  25 reps 25# horizontal and vertical and 1 set of 10 with step back       Manual Therapy   Manual therapy comments  passive internal and external rotation 5x 20 sec holds    Soft tissue mobilization  pectoral, upper trap    Scapular Mobilization  medial and lateral glides                PT Short Term Goals - 09/03/17 1929      PT SHORT TERM GOAL #1   Title  The patient will demonstrate understanding of initial HEP and the importance of low load, long duration and  frequent ROM    Status  Achieved      PT SHORT TERM GOAL #2   Title  The patient will report a 25% improvement in pain with usual ADLs including dressing and putting on a belt    Status  Achieved      PT SHORT TERM GOAL #3   Title  Right shoulder flexion/elevation to 140 degrees for reaching above head level shelf    Status  Achieved      PT SHORT TERM GOAL #4   Title  Patient will be able to reach L4-5 region needed for putting on his belt    Status  Achieved        PT Long Term Goals - 09/03/17 1930      PT LONG TERM GOAL #1   Title  The patient will be independent in safe self progression of HEP needed for further functional improvements    Time  8    Period  Weeks    Status  On-going      PT LONG TERM GOAL #2   Title  The patient will report a 60% reduction in pain with usual ADLs including reaching overhead and putting on his belt    Status  Achieved      PT LONG TERM GOAL #3   Title  The patient will have improved flexion/elevation/scaption to 150 degrees for reaching overhead shelves    Status  Achieved      PT LONG TERM GOAL #4    Title  The patient will have grossly 4+/5 shoulder and scapular strength needed to return to woodworking in his shop    Time  8    Period  Weeks    Status  On-going      PT LONG TERM GOAL #5   Title  FOTO functional outcome score improved from 50% limitation to 31% indicating improved function with less pain    Time  8    Period  Weeks    Status  On-going            Plan - 09/16/17 0802    Clinical Impression Statement  Pt had injection in the Rt shoulder last week.  Pt reports reduced aching in the shoulder and no change in A/ROM so far.  Pt with mild scapular substitution with Rt shoulder motion against gravity.  Pt with limited Rt shoulder IR/ER.  Pt decided to continue with PT as further gains are expected regarding Rt shoulder A/ROM and strength.      Rehab Potential  Good    Clinical Impairments Affecting Rehab Potential  none;  shoulder injection 2/28; surgery 06/20/17    PT Frequency  2x / week    PT Duration  8 weeks    PT Treatment/Interventions  ADLs/Self Care Home Management;Electrical Stimulation;Cryotherapy;Ultrasound;Moist Heat;Iontophoresis 4mg /ml Dexamethasone;Therapeutic exercise;Therapeutic activities;Patient/family education;Neuromuscular re-education;Manual techniques;Dry needling;Taping    PT Next Visit Plan  Continue to advance strength and ROM of Rt shoulder.     Consulted and Agree with Plan of Care  Patient       Patient will benefit from skilled therapeutic intervention in order to improve the following deficits and impairments:  Pain, Postural dysfunction, Decreased range of motion, Decreased strength, Impaired UE functional use, Impaired perceived functional ability, Hypomobility  Visit Diagnosis: Acute pain of right shoulder  Stiffness of right shoulder, not elsewhere classified  Muscle weakness (generalized)     Problem List Patient Active Problem List   Diagnosis Date Noted  .  Status post arthroscopy of right shoulder 06/27/2017  . 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 Chronic right shoulder pain 04/16/2017  . Displaced fracture of proximal phalanx of right great toe with routine healing 03/13/2017  . Impingement syndrome of right shoulder 03/13/2017  . Displaced fracture of proximal phalanx of right great toe, initial encounter for closed fracture 02/20/2017  . Diabetes ([place]) 08/24/2013  . History of Holter monitoring   . Heart murmur   . TIA (transient ischemic attack) 12/26/2012  . Kidney stones   . DM 11/30/2009  . HYPERCHOLESTEROLEMIA 11/30/2009  . SMOKER 11/30/2009  . HYPERTENSION 11/30/2009  . URINARY CALCULUS 11/30/2009  . CAROTID BRUIT, RIGHT 11/30/2009     Sigurd Sos, PT 09/16/17 8:43 AM  [facility] [address] [place], [place], [address] Phone: [phone]   Fax:  [phone]  Name: Luis Lindsey MRN: [id number] Date of Birth: 21-Mar-1951

## 2017-09-18 ENCOUNTER — Ambulatory Visit: Payer: Medicare Other

## 2017-09-18 DIAGNOSIS — M25611 Stiffness of right shoulder, not elsewhere classified: Secondary | ICD-10-CM

## 2017-09-18 DIAGNOSIS — M6281 Muscle weakness (generalized): Secondary | ICD-10-CM

## 2017-09-18 DIAGNOSIS — M25511 Pain in right shoulder: Secondary | ICD-10-CM | POA: Diagnosis not present

## 2017-09-18 NOTE — Therapy (Signed)
[facility] [address] [place], [place], [address] Phone: [phone]   Fax:  [phone]  Physical Therapy Treatment  Patient Details  Name: Luis Lindsey MRN: [id number] Date of Birth: 12-27-1950 Referring Provider: Dr. Ninfa Linden   Encounter Date: 09/18/2017  PT End of Session - 09/18/17 0836    Visit Number  15    Authorization Type  Medicare:  KX at visit 15    PT Start Time  0801    PT Stop Time  0843    PT Time Calculation (min)  42 min    Activity Tolerance  Patient tolerated treatment well    Behavior During Therapy  [facility] for tasks assessed/performed       Past Medical History:  Diagnosis Date  . BCC (basal cell carcinoma)   . BPH (benign prostatic hyperplasia)   . CAROTID BRUIT, RIGHT 11/30/2009  . DM 11/30/2009  . Heart murmur   . History of Holter monitoring 2009  . HYPERCHOLESTEROLEMIA 11/30/2009  . HYPERTENSION 11/30/2009  . Kidney stones   . Right carotid bruit   . SMOKER 11/30/2009  . URINARY CALCULUS 11/30/2009    Past Surgical History:  Procedure Laterality Date  . CYSTOSCOPY    . FACIAL COSMETIC SURGERY  2014  . FINGER SURGERY  2002  . NECK SURGERY  1986  . ROTATOR CUFF REPAIR Left   . UMBILICAL HERNIA REPAIR      There were no vitals filed for this visit.  Subjective Assessment - 09/18/17 0803    Subjective  My shoulder is feeling better.      Currently in Pain?  No/denies         [facility] PT Assessment - 09/18/17 0001      Assessment   Medical Diagnosis  right shoulder pain;  s/p arthroscopy      Observation/Other Assessments   Focus on Therapeutic Outcomes (FOTO)   37% limitation      AROM   Right Shoulder Flexion  155 Degrees    Right Shoulder ABduction  140 Degrees    Right Shoulder Internal Rotation  -- L4 at midline    Right Shoulder External Rotation  -- to T2      Strength   Right Shoulder Flexion  4+/5    Right Shoulder ABduction  4+/5    Right Shoulder Internal  Rotation  5/5    Right Shoulder External Rotation  4+/5                   [facility] Adult PT Treatment/Exercise - 09/18/17 0001      Shoulder Exercises: Seated   Flexion  Strengthening;Right;20 reps;Weights    Flexion Weight (lbs)  2    Abduction  Strengthening;Right;20 reps;Weights    ABduction Weight (lbs)  2    Other Seated Exercises  scaption 2# 2x10      Shoulder Exercises: Standing   Other Standing Exercises  3 ways on wall 10x      Shoulder Exercises: Pulleys   Flexion  3 minutes    Other Pulley Exercises  IR with pulley x 2 minutes      Shoulder Exercises: ROM/Strengthening   UBE (Upper Arm Bike)  L2 6 min  PT present to discuss progress      Manual Therapy   Manual therapy comments  passive internal and external rotation 5x 20 sec holds    Soft tissue mobilization  pectoral, upper trap    Scapular Mobilization  medial  and lateral glides                PT Short Term Goals - 09/03/17 1929      PT SHORT TERM GOAL #1   Title  The patient will demonstrate understanding of initial HEP and the importance of low load, long duration and frequent ROM    Status  Achieved      PT SHORT TERM GOAL #2   Title  The patient will report a 25% improvement in pain with usual ADLs including dressing and putting on a belt    Status  Achieved      PT SHORT TERM GOAL #3   Title  Right shoulder flexion/elevation to 140 degrees for reaching above head level shelf    Status  Achieved      PT SHORT TERM GOAL #4   Title  Patient will be able to reach L4-5 region needed for putting on his belt    Status  Achieved        PT Long Term Goals - 09/18/17 3536      PT LONG TERM GOAL #1   Title  The patient will be independent in safe self progression of HEP needed for further functional improvements    Status  Achieved      PT LONG TERM GOAL #2   Title  The patient will report a 60% reduction in pain with usual ADLs including reaching overhead and putting on his belt     Status  Achieved      PT LONG TERM GOAL #3   Title  The patient will have improved flexion/elevation/scaption to 150 degrees for reaching overhead shelves    Baseline  flexion 155, abduction 140    Status  Partially Met      PT LONG TERM GOAL #4   Title  The patient will have grossly 4+/5 shoulder and scapular strength needed to return to woodworking in his shop    Status  Achieved      PT LONG TERM GOAL #5   Title  FOTO functional outcome score improved from 50% limitation to 31% indicating improved function with less pain    Baseline  37% limitation    Status  Partially Met            Plan - 09/18/17 0803    Clinical Impression Statement  Pt denies any pain today.  Less aching reported after injection.  Pt with scapular elevation with movement of Rt arm into flexion with weight. Pt with limited Rt shoulder IR/ER A/ROM and this is improving.  Pt was able to tolerate increased resistance with exercise and demonstrates improved strength today.  Pt will D/C to HEP today and will continue to work on A/ROM and strength.    Rehab Potential  Good    PT Frequency  2x / week    PT Duration  8 weeks    PT Treatment/Interventions  ADLs/Self Care Home Management;Electrical Stimulation;Cryotherapy;Ultrasound;Moist Heat;Iontophoresis 5m/ml Dexamethasone;Therapeutic exercise;Therapeutic activities;Patient/family education;Neuromuscular re-education;Manual techniques;Dry needling;Taping    PT Next Visit Plan  D/C PT to HEP today    PT Home Exercise Plan  access codes: [place] Agree with Plan of Care  Patient       Patient will benefit from skilled therapeutic intervention in order to improve the following deficits and impairments:  Pain, Postural dysfunction, Decreased range of motion, Decreased strength, Impaired UE functional use, Impaired perceived functional ability, Hypomobility  Visit  Diagnosis: Acute pain of right shoulder  Stiffness of right  shoulder, not elsewhere classified  Muscle weakness (generalized)     Problem List Patient Active Problem List   Diagnosis Date Noted  . Status post arthroscopy of right shoulder 06/27/2017  . 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 Chronic right shoulder pain 04/16/2017  . Displaced fracture of proximal phalanx of right great toe with routine healing 03/13/2017  . Impingement syndrome of right shoulder 03/13/2017  . Displaced fracture of proximal phalanx of right great toe, initial encounter for closed fracture 02/20/2017  . Diabetes ([place]) 08/24/2013  . History of Holter monitoring   . Heart murmur   . TIA (transient ischemic attack) 12/26/2012  . Kidney stones   . DM 11/30/2009  . HYPERCHOLESTEROLEMIA 11/30/2009  . SMOKER 11/30/2009  . HYPERTENSION 11/30/2009  . URINARY CALCULUS 11/30/2009  . CAROTID BRUIT, RIGHT 11/30/2009   PHYSICAL THERAPY DISCHARGE SUMMARY  Visits from Start of Care: 15  Current functional level related to goals / functional outcomes: See above for current goal status.    Remaining deficits: Limited Rt shoulder A/ROM into IR and ER and limited Rt UE functional strength and endurance.  Pt has HEP in place to address remaining deficits.   Education / Equipment: HEP Plan: Patient agrees to discharge.  Patient goals were partially met. Patient is being discharged due to being pleased with the current functional level.  ?????         Sigurd Sos, PT 09/18/17 8:37 AM  [facility] [address] [place], [place], [address] Phone: [phone]   Fax:  [phone]  Name: DEANTE BLOUGH MRN: [id number] Date of Birth: [birth year]

## 2017-11-11 ENCOUNTER — Encounter (INDEPENDENT_AMBULATORY_CARE_PROVIDER_SITE_OTHER): Payer: Self-pay | Admitting: Orthopaedic Surgery

## 2017-11-11 ENCOUNTER — Ambulatory Visit (INDEPENDENT_AMBULATORY_CARE_PROVIDER_SITE_OTHER): Payer: Medicare Other | Admitting: Orthopaedic Surgery

## 2017-11-11 DIAGNOSIS — M7541 Impingement syndrome of right shoulder: Secondary | ICD-10-CM

## 2017-11-11 NOTE — Progress Notes (Signed)
The patient is here for continued follow-up of his right shoulder.  He has severe impingement syndrome of the shoulder well-documented with a physical exam and an MRI of the shoulder.  We have tried both subacromial and intra-articular steroid injections her right shoulder has been to therapy as well.  He is not making improvements that have been made.  Continues to have limited mobility of the shoulder and pain.  On exam he still lacks full forward flexion and external rotation as well as internal rotation with adduction of the shoulder with pain in the subacromial outlet.  At this point he does wish to pursue a surgical option.  I talked to him in detail about the risk and benefits of surgery as well as nonoperative and operative surgical options.  He is already exhausted all nonoperative options.  He has had successful surgery in the past on his left shoulder does wish to proceed with this on the right shoulder.  We a long and thorough discussion about his intraoperative and postoperative course.  All question concerns were answered and addressed.  We will work on getting this scheduled.

## 2017-11-21 ENCOUNTER — Encounter: Payer: Self-pay | Admitting: Orthopaedic Surgery

## 2017-11-21 DIAGNOSIS — M7581 Other shoulder lesions, right shoulder: Secondary | ICD-10-CM | POA: Diagnosis not present

## 2017-11-21 DIAGNOSIS — M7551 Bursitis of right shoulder: Secondary | ICD-10-CM | POA: Diagnosis not present

## 2017-11-21 DIAGNOSIS — G8918 Other acute postprocedural pain: Secondary | ICD-10-CM | POA: Diagnosis not present

## 2017-11-21 DIAGNOSIS — M7541 Impingement syndrome of right shoulder: Secondary | ICD-10-CM | POA: Diagnosis not present

## 2017-12-04 ENCOUNTER — Encounter (INDEPENDENT_AMBULATORY_CARE_PROVIDER_SITE_OTHER): Payer: Self-pay | Admitting: Orthopaedic Surgery

## 2017-12-04 ENCOUNTER — Ambulatory Visit (INDEPENDENT_AMBULATORY_CARE_PROVIDER_SITE_OTHER): Payer: Medicare Other | Admitting: Orthopaedic Surgery

## 2017-12-04 DIAGNOSIS — Z9889 Other specified postprocedural states: Secondary | ICD-10-CM

## 2017-12-04 NOTE — Progress Notes (Signed)
The patient is one-week status post a right shoulder arthroscopy with extensive subacromial decompression debridement.  He is anxious to get his shoulder functioning better.  His pain is minimal.  He wants to improve his motion I agree with this.  I did share with him his arthroscopy pictures to show him the extent of her surgery.  His rotator cuff was pristine and intact.  On exam I removed the sutures from his shoulder.  He looks good overall.  His limitation is mainly internal rotation with adduction which is just above the waistline.  I gave him prescription for outpatient physical therapy and encouraged him to go to this to improve his shoulder motion and function.  All questions and concerns were answered and addressed.  We will see him back in 6 weeks to see how is doing overall.

## 2017-12-06 DIAGNOSIS — M7541 Impingement syndrome of right shoulder: Secondary | ICD-10-CM | POA: Diagnosis not present

## 2017-12-10 DIAGNOSIS — M7541 Impingement syndrome of right shoulder: Secondary | ICD-10-CM | POA: Diagnosis not present

## 2017-12-12 DIAGNOSIS — M7501 Adhesive capsulitis of right shoulder: Secondary | ICD-10-CM | POA: Diagnosis not present

## 2017-12-12 DIAGNOSIS — M7541 Impingement syndrome of right shoulder: Secondary | ICD-10-CM | POA: Diagnosis not present

## 2017-12-12 DIAGNOSIS — M75111 Incomplete rotator cuff tear or rupture of right shoulder, not specified as traumatic: Secondary | ICD-10-CM | POA: Diagnosis not present

## 2017-12-24 DIAGNOSIS — M7501 Adhesive capsulitis of right shoulder: Secondary | ICD-10-CM | POA: Diagnosis not present

## 2017-12-24 DIAGNOSIS — M75111 Incomplete rotator cuff tear or rupture of right shoulder, not specified as traumatic: Secondary | ICD-10-CM | POA: Diagnosis not present

## 2017-12-24 DIAGNOSIS — M7541 Impingement syndrome of right shoulder: Secondary | ICD-10-CM | POA: Diagnosis not present

## 2017-12-26 DIAGNOSIS — M75111 Incomplete rotator cuff tear or rupture of right shoulder, not specified as traumatic: Secondary | ICD-10-CM | POA: Diagnosis not present

## 2017-12-26 DIAGNOSIS — M7541 Impingement syndrome of right shoulder: Secondary | ICD-10-CM | POA: Diagnosis not present

## 2017-12-26 DIAGNOSIS — M7501 Adhesive capsulitis of right shoulder: Secondary | ICD-10-CM | POA: Diagnosis not present

## 2018-01-02 DIAGNOSIS — M7541 Impingement syndrome of right shoulder: Secondary | ICD-10-CM | POA: Diagnosis not present

## 2018-01-02 DIAGNOSIS — M7501 Adhesive capsulitis of right shoulder: Secondary | ICD-10-CM | POA: Diagnosis not present

## 2018-01-02 DIAGNOSIS — M75111 Incomplete rotator cuff tear or rupture of right shoulder, not specified as traumatic: Secondary | ICD-10-CM | POA: Diagnosis not present

## 2018-01-06 DIAGNOSIS — M75111 Incomplete rotator cuff tear or rupture of right shoulder, not specified as traumatic: Secondary | ICD-10-CM | POA: Diagnosis not present

## 2018-01-06 DIAGNOSIS — M7541 Impingement syndrome of right shoulder: Secondary | ICD-10-CM | POA: Diagnosis not present

## 2018-01-06 DIAGNOSIS — M7501 Adhesive capsulitis of right shoulder: Secondary | ICD-10-CM | POA: Diagnosis not present

## 2018-01-09 DIAGNOSIS — M7501 Adhesive capsulitis of right shoulder: Secondary | ICD-10-CM | POA: Diagnosis not present

## 2018-01-09 DIAGNOSIS — M7541 Impingement syndrome of right shoulder: Secondary | ICD-10-CM | POA: Diagnosis not present

## 2018-01-09 DIAGNOSIS — M75111 Incomplete rotator cuff tear or rupture of right shoulder, not specified as traumatic: Secondary | ICD-10-CM | POA: Diagnosis not present

## 2018-01-14 DIAGNOSIS — M75111 Incomplete rotator cuff tear or rupture of right shoulder, not specified as traumatic: Secondary | ICD-10-CM | POA: Diagnosis not present

## 2018-01-14 DIAGNOSIS — M7541 Impingement syndrome of right shoulder: Secondary | ICD-10-CM | POA: Diagnosis not present

## 2018-01-14 DIAGNOSIS — M7501 Adhesive capsulitis of right shoulder: Secondary | ICD-10-CM | POA: Diagnosis not present

## 2018-01-15 ENCOUNTER — Ambulatory Visit (INDEPENDENT_AMBULATORY_CARE_PROVIDER_SITE_OTHER): Payer: Medicare Other | Admitting: Orthopaedic Surgery

## 2018-01-15 ENCOUNTER — Encounter (INDEPENDENT_AMBULATORY_CARE_PROVIDER_SITE_OTHER): Payer: Self-pay | Admitting: Orthopaedic Surgery

## 2018-01-15 DIAGNOSIS — Z9889 Other specified postprocedural states: Secondary | ICD-10-CM

## 2018-01-15 NOTE — Progress Notes (Signed)
Patient is a about 5 weeks status post a right shoulder arthroscopy.  This is second arthroscopy.  He has not minimal pain he says therapy is making progress with him but he feels like he still quite stiff.  On exam his overhead motion is full but reaching behind him is still limited with limitations in internal rotation with adduction.  He is reaching just to the lower lumbar spine where he has full motion on the left side.  His external rotation is slightly limited.  His rotator cuff is strong.  At this point he understands that he just to push himself through therapy and on a daily routine of getting his shoulder moving as well.  All questions were answered and addressed.  We will see him back for final visit in about 6 weeks.

## 2018-01-16 DIAGNOSIS — M7541 Impingement syndrome of right shoulder: Secondary | ICD-10-CM | POA: Diagnosis not present

## 2018-01-16 DIAGNOSIS — M7501 Adhesive capsulitis of right shoulder: Secondary | ICD-10-CM | POA: Diagnosis not present

## 2018-01-16 DIAGNOSIS — M75111 Incomplete rotator cuff tear or rupture of right shoulder, not specified as traumatic: Secondary | ICD-10-CM | POA: Diagnosis not present

## 2018-01-21 DIAGNOSIS — M7501 Adhesive capsulitis of right shoulder: Secondary | ICD-10-CM | POA: Diagnosis not present

## 2018-01-21 DIAGNOSIS — M7541 Impingement syndrome of right shoulder: Secondary | ICD-10-CM | POA: Diagnosis not present

## 2018-01-21 DIAGNOSIS — M75111 Incomplete rotator cuff tear or rupture of right shoulder, not specified as traumatic: Secondary | ICD-10-CM | POA: Diagnosis not present

## 2018-01-23 DIAGNOSIS — M7501 Adhesive capsulitis of right shoulder: Secondary | ICD-10-CM | POA: Diagnosis not present

## 2018-01-23 DIAGNOSIS — M75111 Incomplete rotator cuff tear or rupture of right shoulder, not specified as traumatic: Secondary | ICD-10-CM | POA: Diagnosis not present

## 2018-01-23 DIAGNOSIS — M7541 Impingement syndrome of right shoulder: Secondary | ICD-10-CM | POA: Diagnosis not present

## 2018-01-28 DIAGNOSIS — M7501 Adhesive capsulitis of right shoulder: Secondary | ICD-10-CM | POA: Diagnosis not present

## 2018-01-28 DIAGNOSIS — M7541 Impingement syndrome of right shoulder: Secondary | ICD-10-CM | POA: Diagnosis not present

## 2018-01-28 DIAGNOSIS — M75111 Incomplete rotator cuff tear or rupture of right shoulder, not specified as traumatic: Secondary | ICD-10-CM | POA: Diagnosis not present

## 2018-01-31 DIAGNOSIS — M7501 Adhesive capsulitis of right shoulder: Secondary | ICD-10-CM | POA: Diagnosis not present

## 2018-01-31 DIAGNOSIS — M7541 Impingement syndrome of right shoulder: Secondary | ICD-10-CM | POA: Diagnosis not present

## 2018-01-31 DIAGNOSIS — M75111 Incomplete rotator cuff tear or rupture of right shoulder, not specified as traumatic: Secondary | ICD-10-CM | POA: Diagnosis not present

## 2018-02-17 ENCOUNTER — Ambulatory Visit
Admission: RE | Admit: 2018-02-17 | Discharge: 2018-02-17 | Disposition: A | Discharge status: Home / Self Care | Payer: Medicare Other | Source: Ambulatory Visit | Attending: Family Medicine | Admitting: Family Medicine

## 2018-02-17 ENCOUNTER — Other Ambulatory Visit: Payer: Self-pay | Admitting: Family Medicine

## 2018-02-17 DIAGNOSIS — E1142 Type 2 diabetes mellitus with diabetic polyneuropathy: Secondary | ICD-10-CM | POA: Diagnosis not present

## 2018-02-17 DIAGNOSIS — M79605 Pain in left leg: Secondary | ICD-10-CM

## 2018-02-17 DIAGNOSIS — Z23 Encounter for immunization: Secondary | ICD-10-CM | POA: Diagnosis not present

## 2018-02-17 DIAGNOSIS — M4807 Spinal stenosis, lumbosacral region: Secondary | ICD-10-CM | POA: Diagnosis not present

## 2018-02-17 DIAGNOSIS — M1612 Unilateral primary osteoarthritis, left hip: Secondary | ICD-10-CM | POA: Diagnosis not present

## 2018-02-17 DIAGNOSIS — Z7984 Long term (current) use of oral hypoglycemic drugs: Secondary | ICD-10-CM | POA: Diagnosis not present

## 2018-02-17 DIAGNOSIS — I10 Essential (primary) hypertension: Secondary | ICD-10-CM | POA: Diagnosis not present

## 2018-02-17 DIAGNOSIS — E782 Mixed hyperlipidemia: Secondary | ICD-10-CM | POA: Diagnosis not present

## 2018-02-21 ENCOUNTER — Encounter (INDEPENDENT_AMBULATORY_CARE_PROVIDER_SITE_OTHER): Payer: Medicare Other | Admitting: Neurology

## 2018-02-21 ENCOUNTER — Ambulatory Visit (INDEPENDENT_AMBULATORY_CARE_PROVIDER_SITE_OTHER): Payer: Medicare Other | Admitting: Neurology

## 2018-02-21 DIAGNOSIS — R202 Paresthesia of skin: Secondary | ICD-10-CM | POA: Diagnosis not present

## 2018-02-21 DIAGNOSIS — Z0289 Encounter for other administrative examinations: Secondary | ICD-10-CM

## 2018-02-21 NOTE — Procedures (Signed)
Full Name: Luis Lindsey Gender: Male MRN #: [id number] Date of Birth: 11-Mar-2051    Visit Date: 02/21/18 11:18 Age: 67 Years 45 Months Old Examining Physician: Marcial Pacas, MD  Referring Physician: Dr. Alroy Dust, MD History: 67 year old male with history of diabetes, presented with bilateral feet paresthesia, 2 months history of left side low back pain, radiating pain to left leg, left hip pain.  On exam: bilateral lower extremity muscle strength is normal.  Deep tendon reflex was hypoactive and symmetric  Summary of the test:  Nerve conduction study: Right sural and superficial peroneal sensory responses were absent.  Left sural and peroneal sensory response showed moderately decreased snap amplitude and moderately prolonged peak latency.  Left peroneal to EDB, bilateral tibial motor responses were normal.  Right peroneal to EDB motor response showed moderately decreased the C map amplitude, with mildly prolonged distal latency.  Left median, ulnar sensory and motor responses were normal.  Electromyography: Selective needle examination of left lower extremity muscle showed no significant abnormality.   Conclusion: This is an abnormal study.  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    ------------------------------- Marcial Pacas, M.D. PhD  [facility] [place], [place] [address] Tel: [phone] Fax: [phone]        [facility]    Nerve / Sites Muscle Latency Ref. Amplitude Ref. Rel Amp Segments Distance Velocity Ref. Area    ms ms mV mV %  cm m/s m/s mVms  L Median - APB     Wrist APB 3.5 ?4.4 5.4 ?4.0 100 Wrist - APB 7   16.2     Upper arm APB 8.2  5.1  93.6 Upper arm - Wrist 23 50 ?49 15.8  L Ulnar - ADM     Wrist ADM 2.6 ?3.3 11.7 ?6.0 100 Wrist - ADM 7   35.4     B.Elbow ADM 7.1  10.8  92.5 B.Elbow - Wrist 22 49 ?49 31.9   A.Elbow ADM 9.5  10.6  98.4 A.Elbow - B.Elbow 12 49 ?49 32.0         A.Elbow - Wrist      R Peroneal - EDB     Ankle EDB 6.8 ?6.5 0.5 ?2.0 100 Ankle - EDB 9   1.5     Fib head EDB 15.0  0.5  93.6 Fib head - Ankle 33 40 ?44 1.4     Pop fossa EDB 18.2  0.5  109 Pop fossa - Fib head 12 37 ?44 1.2         Pop fossa - Ankle      L Peroneal - EDB     Ankle EDB 5.7 ?6.5 2.0 ?2.0 100 Ankle - EDB 9   8.3     Fib head EDB 14.8  1.7  82.5 Fib head - Ankle 33 36 ?44 6.2     Pop fossa EDB 17.7  1.6  95.4 Pop fossa - Fib head 10 36 ?44 6.0         Pop fossa - Ankle      R Tibial - AH     Ankle AH 5.2 ?5.8 5.6 ?4.0 100 Ankle - AH 9   14.1     Pop fossa AH 16.9  2.7  47.8 Pop fossa - Ankle 43 37 ?41 8.9  L Tibial - AH     Ankle AH 5.1 ?  5.8 7.0 ?4.0 100 Ankle - AH 9   14.9     Pop fossa AH 17.1  4.0  56.2 Pop fossa - Ankle 43 36 ?41 12.1                  SNC    Nerve / Sites Rec. Site Peak Lat Ref.  Amp Ref. Segments Distance    ms ms V V  cm  R Sural - Ankle (Calf)     Calf Ankle NR ?4.4 NR ?6 Calf - Ankle 14  L Sural - Ankle (Calf)     Calf Ankle 5.0 ?4.4 3 ?6 Calf - Ankle 14  R Superficial peroneal - Ankle     Lat leg Ankle NR ?4.4 NR ?6 Lat leg - Ankle 14  L Superficial peroneal - Ankle     Lat leg Ankle 4.4 ?4.4 2 ?6 Lat leg - Ankle 14  L Median - Orthodromic (Dig II, Mid palm)     Dig II Wrist 3.3 ?3.4 12 ?10 Dig II - Wrist 13  L Ulnar - Orthodromic, (Dig V, Mid palm)     Dig V Wrist 2.8 ?3.1 5 ?5 Dig V - Wrist 47                  F  Wave    Nerve F Lat Ref.   ms ms  R Tibial - AH 58.2 ?56.0  L Tibial - AH 60.8 ?56.0  L Ulnar - ADM 30.1 ?32.0           EMG full       EMG Summary Table    Spontaneous MUAP Recruitment  Muscle IA Fib PSW Fasc Other Amp Dur. Poly Pattern  L. Tibialis anterior Normal None None None _______ Normal Normal Normal Normal  L. Tibialis posterior Normal None None None _______ Normal Normal Normal Normal  L. Peroneus longus Normal None None None  _______ Normal Normal Normal Normal  L. Vastus lateralis Normal None None None _______ Normal Normal Normal Normal  L. Biceps femoris (short head) Normal None None None _______ Normal Normal Normal Normal  L. Lumbar paraspinals (mid) Normal None None None _______ Normal Normal Normal Normal  L. Lumbar paraspinals (low) Normal None None None _______ Normal Normal Normal Normal

## 2018-02-26 ENCOUNTER — Encounter (INDEPENDENT_AMBULATORY_CARE_PROVIDER_SITE_OTHER): Payer: Self-pay | Admitting: Orthopaedic Surgery

## 2018-02-26 ENCOUNTER — Ambulatory Visit (INDEPENDENT_AMBULATORY_CARE_PROVIDER_SITE_OTHER): Payer: Medicare Other | Admitting: Orthopaedic Surgery

## 2018-02-26 DIAGNOSIS — Z9889 Other specified postprocedural states: Secondary | ICD-10-CM

## 2018-02-26 NOTE — Progress Notes (Signed)
HPI: Mr. Ladnier returns today follow-up of his right shoulder.  He is 11 weeks status post right shoulder arthroscopy.  He states overall he is doing better than he is done in years.  He is finished with formal physical therapy.  He is doing a home exercise program which consist of strengthening 3 times a week and stretching daily.  Physical exam: General: Well-developed well-nourished male in no acute distress  Bilateral shoulders: 5 out of 5 strength in external and internal rotation against resistance.  Empty can test negative bilaterally.  Discomfort with impingement testing on the right.  His ability to internally rotate and reach up into his lower lumbar region with this causes some discomfort.  Port sites are all well-healed no signs of infection.  Impression: 11 week status post right shoulder arthroscopy with extensive debridement  Plan: Have him continue to do his home exercises as shown by physical therapy.  He may benefit from a subacromial injection in future if he hits a plateau continues to have significant discomfort in the shoulder.  He will follow-up with [place] on an as-needed basis.  Questions encouraged and answered.

## 2018-04-01 ENCOUNTER — Telehealth: Payer: Self-pay | Admitting: Neurology

## 2018-04-01 ENCOUNTER — Ambulatory Visit (INDEPENDENT_AMBULATORY_CARE_PROVIDER_SITE_OTHER): Payer: Medicare Other | Admitting: Neurology

## 2018-04-01 ENCOUNTER — Encounter: Payer: Self-pay | Admitting: Neurology

## 2018-04-01 ENCOUNTER — Encounter

## 2018-04-01 VITALS — BP 142/82 | HR 76 | Ht 69.0 in | Wt 194.0 lb

## 2018-04-01 DIAGNOSIS — M5416 Radiculopathy, lumbar region: Secondary | ICD-10-CM | POA: Diagnosis not present

## 2018-04-01 DIAGNOSIS — G6289 Other specified polyneuropathies: Secondary | ICD-10-CM

## 2018-04-01 DIAGNOSIS — G629 Polyneuropathy, unspecified: Secondary | ICD-10-CM | POA: Insufficient documentation

## 2018-04-01 DIAGNOSIS — I739 Peripheral vascular disease, unspecified: Secondary | ICD-10-CM

## 2018-04-01 MED ORDER — GABAPENTIN 300 MG PO CAPS: 300.0000 mg | ORAL_CAPSULE | Freq: Three times a day (TID) | ORAL | 11 refills | Status: DC

## 2018-04-01 NOTE — Telephone Encounter (Signed)
Medicare/[employer] supp order sent to GI. They will reach out to the pt to schedule.

## 2018-04-01 NOTE — Telephone Encounter (Signed)
Spoke to patient he is aware . I gave him GI phone number of [phone] and to give them a call if he has not heard in the next 2-3 business days.

## 2018-04-01 NOTE — Progress Notes (Signed)
PATIENT: Luis Lindsey DOB: 25-Jun-1950  Chief Complaint  Patient presents with  . Polyneuropathy    He is here to follow up from his abnormal NCV/EMG on 02/21/18.     HISTORICAL  Luis Lindsey is a 67 years old male, seen in request by his primary care physician Dr. Donnie Coffin for evaluation of peripheral neuropathy, initial evaluation was on April 01, 2018.  I have reviewed and summarized the referring note from the referring physician.  He has past medical history of hypertension, diabetes 20 years, hyperlipidemia, previously heavy smoker of 1 pack a day for 40 years, stopping in 2017  Since 2017, he noticed numbness tingling burning sensation at the bottom of his feet, gradually work his way up now to bilateral ankle level, since 2019, he also noticed mild bilateral fingertips numbness tingling, his fingertip tends to turn to white, sharp burning pain when he grab grocery from freezer.  Since 2016, he also noticed left lower extremity radiating pain, initially it does not put significant limitation to his daily function, now has worsening radiating pain, especially when walking, now he cannot finish shopping at a grocery store, developed left calf deep achy pain after walking short distance.  He denies similar involvement of right lower extremity, denies bowel and bladder incontinence.  Electrodiagnostic study on February 21, 2018 showed evidence of bilateral axonal sensorimotor polyneuropathy, there is no evidence of active lumbosacral radiculopathy.  He also reported, if he does not have enough water intake, he noticed more left lower extremity pain, left foot swelling will decrease with increasing water intake  REVIEW OF SYSTEMS: Full 14 system review of systems performed and notable only for as above  all other review of systems were negative.  ALLERGIES: Allergies  Allergen Reactions  . Codeine   . Liraglutide     REACTION: vomiting    HOME MEDICATIONS: Current  Outpatient Medications  Medication Sig Dispense Refill  . amLODipine-benazepril (LOTREL) 10-40 MG per capsule Take 1 capsule by mouth daily.      [name] aspirin (BAYER ASPIRIN) 325 MG tablet Take 1 tablet (325 mg total) by mouth daily. 30 tablet 6  . hydrochlorothiazide (HYDRODIURIL) 25 MG tablet Take 25 mg by mouth daily.      . metFORMIN (GLUCOPHAGE) 500 MG tablet 2 tablets by mouth twice daily 360 tablet 3  . pioglitazone (ACTOS) 45 MG tablet TAKE 1 TABLET DAILY (NEED APPOINTMENT FOR FURTHER REFILLS) 90 tablet 0  . repaglinide (PRANDIN) 0.5 MG tablet 1 TABLET 15 TO 30 MINUTES BEFORE MEALS THREE TIMES A DAY ORALLY 30 DAY(S)  12  . rosuvastatin (CRESTOR) 20 MG tablet Take 20 mg by mouth daily.    . TRESIBA FLEXTOUCH 100 UNIT/ML SOPN FlexTouch Pen INJECT 45 UNITS ONCE A DAY SUBCUTANEOUSLY  12   No current facility-administered medications for this visit.     PAST MEDICAL HISTORY: Past Medical History:  Diagnosis Date  . BCC (basal cell carcinoma)   . BPH (benign prostatic hyperplasia)   . CAROTID BRUIT, RIGHT 11/30/2009  . DM 11/30/2009  . Heart murmur   . History of Holter monitoring 2009  . HYPERCHOLESTEROLEMIA 11/30/2009  . HYPERTENSION 11/30/2009  . Kidney stones   . Right carotid bruit   . SMOKER 11/30/2009  . URINARY CALCULUS 11/30/2009    PAST SURGICAL HISTORY: Past Surgical History:  Procedure Laterality Date  . CYSTOSCOPY    . FACIAL COSMETIC SURGERY  2014  . FINGER SURGERY  2002  . NECK SURGERY  [place] Left   . UMBILICAL HERNIA REPAIR      FAMILY HISTORY: Family History  Problem Relation Age of Onset  . Diabetes Sister   . Diabetes Mellitus I Sister   . Stroke Father   . Heart disease Father   . CVA Father   . Coronary artery disease Father   . Diabetes Mother   . Heart disease Mother   . Diabetes Mellitus I Mother   . Coronary artery disease Mother   . Coronary artery disease Brother   . Coronary artery disease Brother     SOCIAL  HISTORY: Social History   Socioeconomic History  . Marital status: Married    Spouse name: [name]   . Number of children: 3  . Years of education: 26  . Highest education level: Not on file  Occupational History  . Occupation: Truck Education administrator: [employer]  . Financial resource strain: Not on file  . Food insecurity:    Worry: Not on file    Inability: Not on file  . Transportation needs:    Medical: Not on file    Non-medical: Not on file  Tobacco Use  . Smoking status: Former Smoker    Years: 40.00    Types: Cigarettes    Last attempt to quit: 01/01/2013    Years since quitting: 5.2  . Smokeless tobacco: Never Used  . Tobacco comment: Quit 12-2012  Substance and Sexual Activity  . Alcohol use: No  . Drug use: No  . Sexual activity: Not on file  Lifestyle  . Physical activity:    Days per week: Not on file    Minutes per session: Not on file  . Stress: Not on file  Relationships  . Social connections:    Talks on phone: Not on file    Gets together: Not on file    Attends religious service: Not on file    Active member of club or organization: Not on file    Attends meetings of clubs or organizations: Not on file    Relationship status: Not on file  . Intimate partner violence:    Fear of current or ex partner: Not on file    Emotionally abused: Not on file    Physically abused: Not on file    Forced sexual activity: Not on file  Other Topics Concern  . Not on file  Social History Narrative   . Patient drinks 2-3 cups of caffeine daily.    Patient lives at home with his wife Luis Lindsey.    Patient works at [employer].    Education. 12 th grade   Right handed                 PHYSICAL EXAM   Vitals:   04/01/18 1003  Weight: 194 lb (88 kg)  Height: 5\' 9"  (1.753 m)    Not recorded      Body mass index is 28.65 kg/m.  PHYSICAL EXAMNIATION:  Gen: NAD, conversant, well nourised, obese, well groomed                     Cardiovascular:  Regular rate rhythm, no peripheral edema, warm, nontender. Eyes: Conjunctivae clear without exudates or hemorrhage Neck: Supple, no carotid bruits. Pulmonary: Clear to auscultation bilaterally   NEUROLOGICAL EXAM:  MENTAL STATUS: Speech:    Speech is normal; fluent and spontaneous with normal comprehension.  Cognition:  Orientation to time, place and person     Normal recent and remote memory     Normal Attention span and concentration     Normal Language, naming, repeating,spontaneous speech     Fund of knowledge   CRANIAL NERVES: CN II: Visual fields are full to confrontation. Fundoscopic exam is normal with sharp discs and no vascular changes. Pupils are round equal and briskly reactive to light. CN III, IV, VI: extraocular movement are normal. No ptosis. CN V: Facial sensation is intact to pinprick in all 3 divisions bilaterally. Corneal responses are intact.  CN VII: Face is symmetric with normal eye closure and smile. CN VIII: Hearing is normal to rubbing fingers CN IX, X: Palate elevates symmetrically. Phonation is normal. CN XI: Head turning and shoulder shrug are intact CN XII: Tongue is midline with normal movements and no atrophy.  MOTOR: He has mild left ankle dorsiflexion weakness  REFLEXES: Reflexes are 2+ and symmetric at the biceps, triceps, knees, and absent at ankles. Plantar responses are flexor.  SENSORY: Decreased light touch pinprick to left knee level, mild length dependent sensory change at right leg  COORDINATION: Rapid alternating movements and fine finger movements are intact. There is no dysmetria on finger-to-nose and heel-knee-shin.    GAIT/STANCE: Steady gait, mild difficulty perform heel walking bilaterally   DIAGNOSTIC DATA (LABS, IMAGING, TESTING) - I reviewed patient records, labs, notes, testing and imaging myself where available.   ASSESSMENT AND PLAN  CRAIGE PATEL is a 67 y.o. male   Peripheral neuropathy  Most likely  related to diabetes  Laboratory evaluation for other treatable etiology  Gabapentin 300 mg 3 times daily  Lower extremity radiating pain, claudications,  Differentiation diagnosis including left lumbar radiculopathy, likely a component of vascular claudication  Arterial Doppler study of left lower extremity  MRI of lumbar  Increase water intake  Continue aspirin 81 mg daily  Stop large amount of frequent ibuprofen use   Marcial Pacas, M.D. Ph.D.  [facility] [address], [place],  [address] Ph: [phone] Fax: [phone]  CC: Referring Provider

## 2018-04-12 ENCOUNTER — Ambulatory Visit
Admission: RE | Admit: 2018-04-12 | Discharge: 2018-04-12 | Disposition: A | Discharge status: Home / Self Care | Payer: Medicare Other | Source: Ambulatory Visit | Attending: Neurology | Admitting: Neurology

## 2018-04-12 DIAGNOSIS — M5416 Radiculopathy, lumbar region: Secondary | ICD-10-CM

## 2018-04-12 DIAGNOSIS — G6289 Other specified polyneuropathies: Secondary | ICD-10-CM

## 2018-04-12 DIAGNOSIS — M545 Low back pain: Secondary | ICD-10-CM | POA: Diagnosis not present

## 2018-04-14 ENCOUNTER — Telehealth: Payer: Self-pay | Admitting: Neurology

## 2018-04-14 NOTE — Telephone Encounter (Signed)
Left message requesting a return call.

## 2018-04-14 NOTE — Telephone Encounter (Signed)
Please call patient, MRI of lumbar showed multilevel degenerative changes, at L5-S1, there is a large left paramedian disc herniation, compressing the left S1 nerve roots, I will review him at next follow-up visit on Apr 29 2018.  IMPRESSION: This MRI of the lumbar spine shows the following: 1.   There appears to be a transitional S1 vertebral body.  This is labeled L6/S1 2.    At L4-L5, there is a small central disc herniation causing mild bilateral foraminal narrowing and mild lateral recess stenosis but no nerve root compression. 3.    At L5-L6/S1, there is a large left paramedian disc herniation compressing the left S1 nerve root within the left lateral recess.  4.    At L6/S1-S2,  there is a small right disc protrusion causing mild right foraminal narrowing but no nerve root compression.

## 2018-04-14 NOTE — Telephone Encounter (Signed)
Spoke to patient - he is aware of his results and will keep his pending follow up for further review.

## 2018-04-23 ENCOUNTER — Other Ambulatory Visit: Payer: Self-pay | Admitting: Neurology

## 2018-04-29 ENCOUNTER — Ambulatory Visit (INDEPENDENT_AMBULATORY_CARE_PROVIDER_SITE_OTHER): Payer: Medicare Other | Admitting: Neurology

## 2018-04-29 ENCOUNTER — Encounter: Payer: Self-pay | Admitting: Neurology

## 2018-04-29 VITALS — BP 136/73 | HR 85 | Ht 69.0 in | Wt 189.5 lb

## 2018-04-29 DIAGNOSIS — G6289 Other specified polyneuropathies: Secondary | ICD-10-CM

## 2018-04-29 DIAGNOSIS — M5416 Radiculopathy, lumbar region: Secondary | ICD-10-CM | POA: Diagnosis not present

## 2018-04-29 DIAGNOSIS — I739 Peripheral vascular disease, unspecified: Secondary | ICD-10-CM

## 2018-04-29 NOTE — Progress Notes (Signed)
PATIENT: Luis Lindsey DOB: [birth year]  Chief Complaint  Patient presents with  . Peripheral Neuropathy    He is currently taking gabapentin 300mg  TID.  Overall, feels his symptoms have improved.  He is still having some problems after being on his feet for long hours.   . Left Lower Extremity Pain    He would like to review his MRI results.  He has not had his arterial doppler study yet.     HISTORICAL  Luis Lindsey is a 67 years old male, seen in request by his primary care physician Dr. Donnie Coffin for evaluation of peripheral neuropathy, initial evaluation was on April 01, 2018.  I have reviewed and summarized the referring note from the referring physician.  He has past medical history of hypertension, diabetes 20 years, hyperlipidemia, previously heavy smoker of 1 pack a day for 40 years, stopped in 2017  Since 2017, he noticed numbness tingling burning sensation at the bottom of his feet, gradually work its way up now to bilateral ankle level, since 2019, he also noticed mild bilateral fingertips numbness tingling, his fingertip tends to turn to white, sharp burning pain when he grab grocery from freezer.  Since 2016, he also noticed left lower extremity radiating pain, initially it does not put significant limitation to his daily function, now has worsening radiating pain, especially when walking, now he cannot finish shopping at a grocery store, developed left calf deep achy pain after walking short distance.  He denies similar involvement of right lower extremity, denies bowel and bladder incontinence.  Electrodiagnostic study on February 21, 2018 showed evidence of bilateral axonal sensorimotor polyneuropathy, there is no evidence of active lumbosacral radiculopathy.  He also reported, if he does not have enough water intake, he noticed more left lower extremity pain, left foot swelling will decrease with increasing water intake  UPDATE Apr 29 2018: He continues to  have significant left low back pain, radiating pain to left leg, we personally reviewed MRI of lumbar spine November 2019, L5-S1 large left paramedian disc herniation compressing the left S1 nerve roots within the left lateral recess,  X-ray of left hip in September 2019: Mild left hip joint degenerative changes.  REVIEW OF SYSTEMS: Full 14 system review of systems performed and notable only for as above  all other review of systems were negative.  ALLERGIES: Allergies  Allergen Reactions  . Codeine   . Liraglutide     REACTION: vomiting    HOME MEDICATIONS: Current Outpatient Medications  Medication Sig Dispense Refill  . amLODipine-benazepril (LOTREL) 10-40 MG per capsule Take 1 capsule by mouth daily.      [name] aspirin (BAYER ASPIRIN) 325 MG tablet Take 1 tablet (325 mg total) by mouth daily. 30 tablet 6  . gabapentin (NEURONTIN) 300 MG capsule TAKE 1 CAPSULE BY MOUTH THREE TIMES A DAY 270 capsule 3  . hydrochlorothiazide (HYDRODIURIL) 25 MG tablet Take 25 mg by mouth daily.      . metFORMIN (GLUCOPHAGE) 500 MG tablet 2 tablets by mouth twice daily 360 tablet 3  . pioglitazone (ACTOS) 45 MG tablet TAKE 1 TABLET DAILY (NEED APPOINTMENT FOR FURTHER REFILLS) 90 tablet 0  . repaglinide (PRANDIN) 0.5 MG tablet 1 TABLET 15 TO 30 MINUTES BEFORE MEALS THREE TIMES A DAY ORALLY 30 DAY(S)  12  . rosuvastatin (CRESTOR) 20 MG tablet Take 20 mg by mouth daily.    . TRESIBA FLEXTOUCH 100 UNIT/ML SOPN FlexTouch Pen Inject 60 Units into the skin  daily.   12   No current facility-administered medications for this visit.     PAST MEDICAL HISTORY: Past Medical History:  Diagnosis Date  . BCC (basal cell carcinoma)   . BPH (benign prostatic hyperplasia)   . CAROTID BRUIT, RIGHT 11/30/2009  . DM 11/30/2009  . Heart murmur   . History of Holter monitoring 2009  . HYPERCHOLESTEROLEMIA 11/30/2009  . HYPERTENSION 11/30/2009  . Kidney stones   . Right carotid bruit   . SMOKER 11/30/2009  . URINARY CALCULUS  11/30/2009    PAST SURGICAL HISTORY: Past Surgical History:  Procedure Laterality Date  . CYSTOSCOPY    . FACIAL COSMETIC SURGERY  2014  . FINGER SURGERY  2002  . NECK SURGERY  1986  . ROTATOR CUFF REPAIR Left   . UMBILICAL HERNIA REPAIR      FAMILY HISTORY: Family History  Problem Relation Age of Onset  . Diabetes Sister   . Diabetes Mellitus I Sister   . Stroke Father   . Heart disease Father   . CVA Father   . Coronary artery disease Father   . Diabetes Mother   . Heart disease Mother   . Diabetes Mellitus I Mother   . Coronary artery disease Mother   . Coronary artery disease Brother   . Coronary artery disease Brother     SOCIAL HISTORY: Social History   Socioeconomic History  . Marital status: Married    Spouse name: [name]   . Number of children: 3  . Years of education: 65  . Highest education level: Not on file  Occupational History  . Occupation: Truck Education administrator: [employer]  . Financial resource strain: Not on file  . Food insecurity:    Worry: Not on file    Inability: Not on file  . Transportation needs:    Medical: Not on file    Non-medical: Not on file  Tobacco Use  . Smoking status: Former Smoker    Years: 40.00    Types: Cigarettes    Last attempt to quit: 01/01/2013    Years since quitting: 5.3  . Smokeless tobacco: Never Used  . Tobacco comment: Quit 12-2012  Substance and Sexual Activity  . Alcohol use: No  . Drug use: No  . Sexual activity: Not on file  Lifestyle  . Physical activity:    Days per week: Not on file    Minutes per session: Not on file  . Stress: Not on file  Relationships  . Social connections:    Talks on phone: Not on file    Gets together: Not on file    Attends religious service: Not on file    Active member of club or organization: Not on file    Attends meetings of clubs or organizations: Not on file    Relationship status: Not on file  . Intimate partner violence:    Fear of current or  ex partner: Not on file    Emotionally abused: Not on file    Physically abused: Not on file    Forced sexual activity: Not on file  Other Topics Concern  . Not on file  Social History Narrative   . Patient drinks 2-3 cups of caffeine daily.    Patient lives at home with his wife [name].    Patient works at [employer].    Education. 12 th grade   Right handed  PHYSICAL EXAM   Vitals:   04/29/18 1342  BP: 136/73  Pulse: 85  Weight: 189 lb 8 oz (86 kg)  Height: 5\' 9"  (1.753 m)    Not recorded      Body mass index is 27.98 kg/m.  PHYSICAL EXAMNIATION:  Gen: NAD, conversant, well nourised, obese, well groomed                     Cardiovascular: Regular rate rhythm, no peripheral edema, warm, nontender. Eyes: Conjunctivae clear without exudates or hemorrhage Neck: Supple, no carotid bruits. Pulmonary: Clear to auscultation bilaterally   NEUROLOGICAL EXAM:  MENTAL STATUS: Speech:    Speech is normal; fluent and spontaneous with normal comprehension.  Cognition:     Orientation to time, place and person     Normal recent and remote memory     Normal Attention span and concentration     Normal Language, naming, repeating,spontaneous speech     Fund of knowledge   CRANIAL NERVES: CN II: Visual fields are full to confrontation. Fundoscopic exam is normal with sharp discs and no vascular changes. Pupils are round equal and briskly reactive to light. CN III, IV, VI: extraocular movement are normal. No ptosis. CN V: Facial sensation is intact to pinprick in all 3 divisions bilaterally. Corneal responses are intact.  CN VII: Face is symmetric with normal eye closure and smile. CN VIII: Hearing is normal to rubbing fingers CN IX, X: Palate elevates symmetrically. Phonation is normal. CN XI: Head turning and shoulder shrug are intact CN XII: Tongue is midline with normal movements and no atrophy.  MOTOR: He has mild left ankle dorsiflexion, toe extension  weakness  REFLEXES: Reflexes are 2+ and symmetric at the biceps, triceps, knees, and absent at ankles. Plantar responses are flexor.  SENSORY: Decreased light touch pinprick to left knee level, mild length dependent sensory change at right leg  COORDINATION: Rapid alternating movements and fine finger movements are intact. There is no dysmetria on finger-to-nose and heel-knee-shin.    GAIT/STANCE: Steady gait, mild difficulty perform heel walking bilaterally   DIAGNOSTIC DATA (LABS, IMAGING, TESTING) - I reviewed patient records, labs, notes, testing and imaging myself where available.   ASSESSMENT AND PLAN  DALTEN AMBROSINO is a 67 y.o. male   Peripheral neuropathy  Most likely related to diabetes  Laboratory evaluation for other treatable etiology  Gabapentin 300 mg 3 times daily  Lower lumbar radiculopathy involving left L5 and S1  Arterial Doppler study of left lower extremity to rule out left lower extremity peripheral vascular disease.  Refer to neurosurgeon      Marcial Pacas, M.D. Ph.D.  [facility] [address], [place], [place] [address] Ph: [phone] Fax: [phone]  CC: Referring Provider

## 2018-05-02 ENCOUNTER — Ambulatory Visit (HOSPITAL_COMMUNITY)
Admission: RE | Admit: 2018-05-02 | Discharge: 2018-05-02 | Disposition: A | Discharge status: Home / Self Care | Payer: Medicare Other | Source: Ambulatory Visit | Attending: Neurology | Admitting: Neurology

## 2018-05-02 ENCOUNTER — Telehealth: Payer: Self-pay | Admitting: Neurology

## 2018-05-02 DIAGNOSIS — G6289 Other specified polyneuropathies: Secondary | ICD-10-CM

## 2018-05-02 DIAGNOSIS — I739 Peripheral vascular disease, unspecified: Secondary | ICD-10-CM | POA: Diagnosis not present

## 2018-05-02 DIAGNOSIS — M5416 Radiculopathy, lumbar region: Secondary | ICD-10-CM | POA: Diagnosis not present

## 2018-05-02 NOTE — Telephone Encounter (Signed)
Patient scheduled with Dr. Sherley Bounds 05/06/2018 at 1:00 . Patient aware.

## 2018-05-05 ENCOUNTER — Telehealth: Payer: Self-pay | Admitting: Neurology

## 2018-05-05 DIAGNOSIS — I739 Peripheral vascular disease, unspecified: Secondary | ICD-10-CM

## 2018-05-05 NOTE — Telephone Encounter (Signed)
There is evidence of moderate bilateral lower extremity peripheral vascular disease,  I have referred him to vascular surgeon for evalution  Summary: Right: Resting right ankle-brachial index indicates moderate right lower extremity arterial disease. The right toe-brachial index is abnormal.  Left: Resting left ankle-brachial index indicates moderate left lower extremity arterial disease. The left toe-brachial index is abnormal.

## 2018-05-05 NOTE — Telephone Encounter (Signed)
Rn call patient about his EMG results and referral to vascular surgery. Rn did not see a dpr on file to speak with wife. Rn left message for call back. The wife stated he is at work today. Rn will call back in the pm.

## 2018-05-06 ENCOUNTER — Other Ambulatory Visit: Payer: Self-pay | Admitting: Neurological Surgery

## 2018-05-06 DIAGNOSIS — M5126 Other intervertebral disc displacement, lumbar region: Secondary | ICD-10-CM | POA: Diagnosis not present

## 2018-05-06 NOTE — Telephone Encounter (Signed)
Rn call patient to give him the EMG results. Rn gave him the results of the EMG. Pt verbalized understanding. Rn also stated because of some abnormal finding he will be referred to vascular surgery. PT knows to call referrals back and speak with Hinton Dyer about the office phone number. PT verbalized understanding.

## 2018-05-13 NOTE — Pre-Procedure Instructions (Signed)
Luis Lindsey  05/13/2018      [id number] [name], [place] [address] Phone: [phone] Fax: [phone]    Your procedure is scheduled on May 15, 2018.  Report to [facility] Admitting at 100 PM.  Call this number if you have problems the morning of surgery:  [phone]   Remember:  Do not eat or drink after midnight.    Take these medicines the morning of surgery with A SIP OF WATER  Gabapentin (neurontin) Metoprolol succinate (Toprol XL)  Follow your surgeon's instructions on when to hold/resume aspirin.  If no instructions were given call the office to determine how they would like to you take aspirin  7 days prior to surgery STOP taking any Aspirin (unless otherwise instructed by your surgeon), Aleve, Naproxen, Ibuprofen, Motrin, Advil, Goody's, BC's, all herbal medications, fish oil, and all vitamins   WHAT DO I DO ABOUT MY DIABETES MEDICATION?   [name] Do not take oral diabetes medicines (pills) the morning of surgery-metformin (glucophage), repaglinide (prandin) or  Pioglitazone (actos).  . THE MORNING OF SURGERY, take 30 units of Tresiba insulin (1/2 of your normal dose).  . The day of surgery, do not take other diabetes injectables, including Byetta (exenatide), Bydureon (exenatide ER), Victoza (liraglutide), or Trulicity (dulaglutide).  . If your CBG is greater than 220 mg/dL, you may take  of your sliding scale (correction) dose of insulin.   Reviewed and Endorsed by [facility] Patient Education Committee, August 2015   How to Manage Your Diabetes Before and After Surgery  Why is it important to control my blood sugar before and after surgery? . Improving blood sugar levels before and after surgery helps healing and can limit problems. . A way of improving blood sugar control is eating a healthy diet by: o  Eating less sugar and  carbohydrates o  Increasing activity/exercise o  Talking with your doctor about reaching your blood sugar goals . High blood sugars (greater than 180 mg/dL) can raise your risk of infections and slow your recovery, so you will need to focus on controlling your diabetes during the weeks before surgery. . Make sure that the doctor who takes care of your diabetes knows about your planned surgery including the date and location.  How do I manage my blood sugar before surgery? . Check your blood sugar at least 4 times a day, starting 2 days before surgery, to make sure that the level is not too high or low. o Check your blood sugar the morning of your surgery when you wake up and every 2 hours until you get to the Short Stay unit. . If your blood sugar is less than 70 mg/dL, you will need to treat for low blood sugar: o Do not take insulin. o Treat a low blood sugar (less than 70 mg/dL) with  cup of clear juice (cranberry or apple), 4 glucose tablets, OR glucose gel. Recheck blood sugar in 15 minutes after treatment (to make sure it is greater than 70 mg/dL). If your blood sugar is not greater than 70 mg/dL on recheck, call [phone] o  for further instructions. . Report your blood sugar to the short stay nurse when you get to Short Stay.  . If you are admitted to the hospital after surgery: o Your blood sugar will be checked by the staff and you will probably be given insulin after  surgery (instead of oral diabetes medicines) to make sure you have good blood sugar levels. o The goal for blood sugar control after surgery is 80-180 mg/dL.   [facility]- Preparing For Surgery  Before surgery, you can play an important role. Because skin is not sterile, your skin needs to be as free of germs as possible. You can reduce the number of germs on your skin by washing with CHG (chlorahexidine gluconate) Soap before surgery.  CHG is an antiseptic cleaner which kills germs and bonds with the skin to  continue killing germs even after washing.    Oral Hygiene is also important to reduce your risk of infection.  Remember - BRUSH YOUR TEETH THE MORNING OF SURGERY WITH YOUR REGULAR TOOTHPASTE  Please do not use if you have an allergy to CHG or antibacterial soaps. If your skin becomes reddened/irritated stop using the CHG.  Do not shave (including legs and underarms) for at least 48 hours prior to first CHG shower. It is OK to shave your face.  Please follow these instructions carefully.   1. Shower the NIGHT BEFORE SURGERY and the MORNING OF SURGERY with CHG.   2. If you chose to wash your hair, wash your hair first as usual with your normal shampoo.  3. After you shampoo, rinse your hair and body thoroughly to remove the shampoo.  4. Use CHG as you would any other liquid soap. You can apply CHG directly to the skin and wash gently with a scrungie or a clean washcloth.   5. Apply the CHG Soap to your body ONLY FROM THE NECK DOWN.  Do not use on open wounds or open sores. Avoid contact with your eyes, ears, mouth and genitals (private parts). Wash Face and genitals (private parts)  with your normal soap.  6. Wash thoroughly, paying special attention to the area where your surgery will be performed.  7. Thoroughly rinse your body with warm water from the neck down.  8. DO NOT shower/wash with your normal soap after using and rinsing off the CHG Soap.  9. Pat yourself dry with a CLEAN TOWEL.  10. Wear CLEAN PAJAMAS to bed the night before surgery, wear comfortable clothes the morning of surgery  11. Place CLEAN SHEETS on your bed the night of your first shower and DO NOT SLEEP WITH PETS.  Day of Surgery:  Do not apply any deodorants/lotions.  Please wear clean clothes to the hospital/surgery center.   Remember to brush your teeth WITH YOUR REGULAR TOOTHPASTE.   Do not wear jewelry  Do not wear lotions, powders, or colognes, or deodorant.  Men may shave face and neck.  Do not  bring valuables to the hospital.  [facility] is not responsible for any belongings or valuables.  Contacts, dentures or bridgework may not be worn into surgery.  Leave your suitcase in the car.  After surgery it may be brought to your room.  For patients admitted to the hospital, discharge time will be determined by your treatment team.  Patients discharged the day of surgery will not be allowed to drive home.   Please read over the following fact sheets that you were given.

## 2018-05-14 ENCOUNTER — Encounter (HOSPITAL_COMMUNITY): Payer: Self-pay | Admitting: Vascular Surgery

## 2018-05-14 ENCOUNTER — Other Ambulatory Visit: Payer: Self-pay

## 2018-05-14 ENCOUNTER — Encounter (HOSPITAL_COMMUNITY): Payer: Self-pay

## 2018-05-14 ENCOUNTER — Encounter (HOSPITAL_COMMUNITY)
Admission: RE | Admit: 2018-05-14 | Discharge: 2018-05-14 | Disposition: A | Discharge status: Home / Self Care | Payer: Medicare Other | Source: Ambulatory Visit | Attending: Neurological Surgery | Admitting: Neurological Surgery

## 2018-05-14 DIAGNOSIS — M1990 Unspecified osteoarthritis, unspecified site: Secondary | ICD-10-CM | POA: Insufficient documentation

## 2018-05-14 DIAGNOSIS — Z79899 Other long term (current) drug therapy: Secondary | ICD-10-CM | POA: Diagnosis not present

## 2018-05-14 DIAGNOSIS — Z01818 Encounter for other preprocedural examination: Secondary | ICD-10-CM | POA: Diagnosis not present

## 2018-05-14 DIAGNOSIS — E119 Type 2 diabetes mellitus without complications: Secondary | ICD-10-CM | POA: Insufficient documentation

## 2018-05-14 DIAGNOSIS — E7800 Pure hypercholesterolemia, unspecified: Secondary | ICD-10-CM | POA: Insufficient documentation

## 2018-05-14 DIAGNOSIS — Z7982 Long term (current) use of aspirin: Secondary | ICD-10-CM | POA: Insufficient documentation

## 2018-05-14 DIAGNOSIS — Z7984 Long term (current) use of oral hypoglycemic drugs: Secondary | ICD-10-CM | POA: Insufficient documentation

## 2018-05-14 DIAGNOSIS — I10 Essential (primary) hypertension: Secondary | ICD-10-CM | POA: Diagnosis not present

## 2018-05-14 DIAGNOSIS — Z87891 Personal history of nicotine dependence: Secondary | ICD-10-CM | POA: Diagnosis not present

## 2018-05-14 HISTORY — DX: Unspecified osteoarthritis, unspecified site: M19.90

## 2018-05-14 HISTORY — DX: Peripheral vascular disease, unspecified: I73.9

## 2018-05-14 HISTORY — DX: Cerebral infarction, unspecified: I63.9

## 2018-05-14 HISTORY — DX: Personal history of urinary calculi: Z87.442

## 2018-05-14 LAB — SURGICAL PCR SCREEN
MRSA, PCR: NEGATIVE
Staphylococcus aureus: NEGATIVE

## 2018-05-14 LAB — CBC WITH DIFFERENTIAL/PLATELET
ABS IMMATURE GRANULOCYTES: 0.05 10*3/uL (ref 0.00–0.07)
Basophils Absolute: 0.1 10*3/uL (ref 0.0–0.1)
Basophils Relative: 1 %
Eosinophils Absolute: 0.1 10*3/uL (ref 0.0–0.5)
Eosinophils Relative: 1 %
HCT: 44 % (ref 39.0–52.0)
Hemoglobin: 14.5 g/dL (ref 13.0–17.0)
Immature Granulocytes: 1 %
LYMPHS ABS: 2.1 10*3/uL (ref 0.7–4.0)
Lymphocytes Relative: 25 %
MCH: 28.4 pg (ref 26.0–34.0)
MCHC: 33 g/dL (ref 30.0–36.0)
MCV: 86.1 fL (ref 80.0–100.0)
MONOS PCT: 10 %
Monocytes Absolute: 0.9 10*3/uL (ref 0.1–1.0)
Neutro Abs: 5.2 10*3/uL (ref 1.7–7.7)
Neutrophils Relative %: 62 %
Platelets: 363 10*3/uL (ref 150–400)
RBC: 5.11 MIL/uL (ref 4.22–5.81)
RDW: 14.8 % (ref 11.5–15.5)
WBC: 8.3 10*3/uL (ref 4.0–10.5)
nRBC: 0 % (ref 0.0–0.2)

## 2018-05-14 LAB — BASIC METABOLIC PANEL
Anion gap: 12 (ref 5–15)
BUN: 16 mg/dL (ref 8–23)
CO2: 24 mmol/L (ref 22–32)
CREATININE: 1.12 mg/dL (ref 0.61–1.24)
Calcium: 9.8 mg/dL (ref 8.9–10.3)
Chloride: 101 mmol/L (ref 98–111)
GFR calc Af Amer: >60 mL/min (ref >60)
GFR calc non Af Amer: >60 mL/min (ref >60)
Glucose, Bld: 194 mg/dL — ABNORMAL HIGH (ref 70–99)
Potassium: 3.5 mmol/L (ref 3.5–5.1)
Sodium: 137 mmol/L (ref 135–145)

## 2018-05-14 LAB — PROTIME-INR
INR: 1
Prothrombin Time: 13.1 seconds (ref 11.4–15.2)

## 2018-05-14 LAB — HEMOGLOBIN A1C
Hgb A1c MFr Bld: 10.2 % — ABNORMAL HIGH (ref 4.8–5.6)
Mean Plasma Glucose: 246.04 mg/dL

## 2018-05-14 LAB — GLUCOSE, CAPILLARY: Glucose-Capillary: 181 mg/dL — ABNORMAL HIGH (ref 70–99)

## 2018-05-14 NOTE — Anesthesia Preprocedure Evaluation (Deleted)
Anesthesia Evaluation    Airway        Dental   Pulmonary former smoker,           Cardiovascular hypertension,      Neuro/Psych    GI/Hepatic   Endo/Other  diabetes  Renal/GU      Musculoskeletal   Abdominal   Peds  Hematology   Anesthesia Other Findings   Reproductive/Obstetrics                             Anesthesia Physical Anesthesia Plan  ASA:   Anesthesia Plan:    Post-op Pain Management:    Induction:   PONV Risk Score and Plan:   Airway Management Planned:   Additional Equipment:   Intra-op Plan:   Post-operative Plan:   Informed Consent:   Plan Discussed with:   Anesthesia Plan Comments: (PAT note written 05/14/2018 by Myra Gianotti, PA-C. A1c 10.2 called to surgeon's office. History of murmur (II/VI SEM per PCP).  )        Anesthesia Quick Evaluation

## 2018-05-14 NOTE — Progress Notes (Signed)
Results of A1C routed to Dr. Ronnald Ramp.

## 2018-05-14 NOTE — Progress Notes (Signed)
Anesthesia Chart Review:  Case:  [id number] Date/Time:  05/15/18 1443   Procedure:  Microdiscectomy - L4-L5 - left (Left Back)   Anesthesia type:  General   Pre-op diagnosis:  HNP   Location:  [facility] OR ROOM 20 / [facility]   Surgeon:  Eustace Moore, MD      DISCUSSION: Patient is a 67 year old male scheduled for the above procedure.  History includes former smoker (quit '14), HTN, DM2, CVA '12, right carotid bruit '11 ("unremarkable carotid Doppler" by [facility] records), murmur ("Grade 2/6 SEM" per 02/17/18 exam by Alroy Dust, [name], MD), facial cosmetic surgery (not specified) '14.   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 PCP classifies murmur as 2/6 SEM.   A1c is 10.2, up from 9.4 (on 02/17/18-repaglinide 0.5 mg added before meals by PCP). He reported home fasting CBGs of ~ 125. A1c result called to [facility] at Dr. Ronnald Ramp' office.  Currently unable to reach patient by both phone number listed, but he denied SOB, cough, fever, and chest pain at PAT. He thought he had prior echo, but no record of echo per PCP office staff. Discussed known information with anesthesiologist Roberts Gaudy. Anesthesiologist to evaluate on the day of surgery.    VS: BP 138/64   Temp 36.6 C   Resp 20   Ht 5\' 9"  (1.753 m)   Wt 85.9 kg   SpO2 100%   BMI 27.95 kg/m    PROVIDERS: Mitchell, [name], MD is PCP [facility]) - Marcial Pacas, MD is neurologist. Last visit 04/29/18 for peripheral neuropathy and LLE pain. She is referring him to vascular surgery since bilateral ABI indicated moderate RLE arterial disease. 03/2018 MRI L-spine showed L5-S1 large paramedian disc herniation compressing the left S1 nerve roots, so he was referred to neurosurgery.    LABS: Preoperative labs noted. Cr 1.12. CBC WNL. A1c elevated at 10.2  (all labs ordered are listed, but only abnormal results  are displayed)  Labs Reviewed  GLUCOSE, CAPILLARY - Abnormal; Notable for the following components:      Result Value   Glucose-Capillary 181 (*)    All other components within normal limits  BASIC METABOLIC PANEL - Abnormal; Notable for the following components:   Glucose, Bld 194 (*)    All other components within normal limits  HEMOGLOBIN A1C - Abnormal; Notable for the following components:   Hgb A1c MFr Bld 10.2 (*)    All other components within normal limits  SURGICAL PCR SCREEN  CBC WITH DIFFERENTIAL/PLATELET  PROTIME-INR    IMAGES: MRI 04/12/18: IMPRESSION: This MRI of the lumbar spine shows the following: 1.   There appears to be a transitional S1 vertebral body.  This is labeled L6/S1 2.    At L4-L5, there is a small central disc herniation causing mild bilateral foraminal narrowing and mild lateral recess stenosis but no nerve root compression. 3.    At L5-L6/S1, there is a large left paramedian disc herniation compressing the left S1 nerve root within the left lateral recess.  4.    At L6/S1-S2,  there is a small right disc protrusion causing mild right foraminal narrowing but no nerve root compression.   EKG: 05/14/18: SB at 53 bpm, first degree AV block.   CV: ETT 05/11/11 [facility]):  Summary:  THR achieved. No symptoms. Baseline EKG showed NSR at 100  bpm with prominent Q waves. During exercise, rare PVC noted. No EKG changes suggestive of ischemia. Recovery uneventful.  BP response extremely exaggerated. Pending Dr. Marlou Porch.  - He had carotid U/S 09/18/13 (ordered by [facility]), but report summary cannot be viewed in Epic. Staff at [facility] was also unable to locate report results. However, 02/17/18 office note by Alroy Dust, [name], MD indicates that patient has known right carotid bruit with "unremarkable carotid Doppler."   Past Medical History:  Diagnosis Date  . Arthritis   . BCC (basal cell carcinoma)   . BPH (benign prostatic hyperplasia)   .  CAROTID BRUIT, RIGHT 11/30/2009  . DM 11/30/2009  . Heart murmur   . History of Holter monitoring 2009  . History of kidney stones   . HYPERCHOLESTEROLEMIA 11/30/2009  . HYPERTENSION 11/30/2009  . Kidney stones   . Peripheral vascular disease ([place])   . Right carotid bruit   . SMOKER 11/30/2009  . Stroke ([place]) 2012  . URINARY CALCULUS 11/30/2009    Past Surgical History:  Procedure Laterality Date  . arthroscopy of right shoulder Right    Jan 2019, June 2019-- done at [facility], Dr. Ninfa Linden  . CYSTOSCOPY    . FACIAL COSMETIC SURGERY  2014  . FINGER SURGERY  2002  . NECK SURGERY  1986  . ROTATOR CUFF REPAIR Left   . UMBILICAL HERNIA REPAIR      MEDICATIONS: . amLODipine-benazepril (LOTREL) 10-40 MG per capsule  . aspirin (BAYER ASPIRIN) 325 MG tablet  . gabapentin (NEURONTIN) 300 MG capsule  . hydrochlorothiazide (HYDRODIURIL) 25 MG tablet  . metFORMIN (GLUCOPHAGE) 500 MG tablet  . metoprolol succinate (TOPROL-XL) 50 MG 24 hr tablet  . pioglitazone (ACTOS) 45 MG tablet  . repaglinide (PRANDIN) 0.5 MG tablet  . rosuvastatin (CRESTOR) 40 MG tablet  . TRESIBA FLEXTOUCH 100 UNIT/ML SOPN FlexTouch Pen   No current facility-administered medications for this encounter.     George Hugh [facility] Short Stay Center/Anesthesiology Phone [phone] 05/14/2018 2:12 PM

## 2018-05-14 NOTE — Progress Notes (Signed)
PCP: Dr. Alford Highland Cardiologist: Denies--reports saw cardiologist for stress test (reports only had cardiac testing due to Hx Heart Murmur--testing required by his work).  EKG: Today CXR: Denies ECHO: Denies Stress Test: 2012?  Per pt, PCP ordered stress test. Called Dr. Virgilio Belling office and requested any records. Cardiac Cath: Denies  Fasting Blood Sugar- 125 Checks Blood Sugar 3-4 times per week  Patient denies shortness of breath, fever, cough, and chest pain at PAT appointment.  Patient verbalized understanding of instructions provided today at the PAT appointment.  Patient asked to review instructions at home and day of surgery.   Called anesthesia to review

## 2018-05-15 ENCOUNTER — Ambulatory Visit (HOSPITAL_COMMUNITY): Admission: RE | Admit: 2018-05-15 | Payer: Medicare Other | Source: Home / Self Care | Admitting: Neurological Surgery

## 2018-05-15 ENCOUNTER — Encounter (HOSPITAL_COMMUNITY): Admission: RE | Payer: Self-pay | Source: Home / Self Care

## 2018-05-15 SURGERY — LUMBAR LAMINECTOMY/DECOMPRESSION MICRODISCECTOMY 1 LEVEL
Anesthesia: General | Site: Back | Laterality: Left

## 2018-05-26 DIAGNOSIS — E1165 Type 2 diabetes mellitus with hyperglycemia: Secondary | ICD-10-CM | POA: Diagnosis not present

## 2018-06-10 ENCOUNTER — Other Ambulatory Visit: Payer: Self-pay

## 2018-06-10 ENCOUNTER — Encounter: Payer: Self-pay | Admitting: *Deleted

## 2018-06-10 ENCOUNTER — Ambulatory Visit: Payer: Medicare Other | Admitting: Vascular Surgery

## 2018-06-10 ENCOUNTER — Encounter: Payer: Self-pay | Admitting: Vascular Surgery

## 2018-06-10 DIAGNOSIS — I739 Peripheral vascular disease, unspecified: Secondary | ICD-10-CM | POA: Insufficient documentation

## 2018-06-10 NOTE — Progress Notes (Signed)
Patient name: Luis Lindsey MRN: [id number] DOB: 12/19/1950 Sex: male  REASON FOR CONSULT: Left leg pain with tingling and numbness  HPI: Luis Lindsey is a 68 y.o. male, with history of hypertension, hypercholesterolemia, diabetes that presents for evaluation of left leg pain.  It appears that he was recently seen by neurology for evaluation of neuropathy.  During that appointment the patient did get noninvasive imaging ordered shows an ABI of 0.59 in the left leg versus 0.75 in the right leg.  Ultimately he was referred to vascular surgery.  He states he had significant pain in his left foot for several years now.  He does endorse a history of claudication with burning in his left calf for several years.  States the left leg is much more severe than the right.  Most recently he now has an aching and tingling in the foot that keeps him awake at night.  He has had no lower extremity interventions.  He does smoke cigars but not cigarettes.  He does take an aspirin and a statin.  Past Medical History:  Diagnosis Date  . Arthritis   . BCC (basal cell carcinoma)   . BPH (benign prostatic hyperplasia)   . CAROTID BRUIT, RIGHT 11/30/2009  . DM 11/30/2009  . Heart murmur   . History of Holter monitoring 2009  . History of kidney stones   . HYPERCHOLESTEROLEMIA 11/30/2009  . HYPERTENSION 11/30/2009  . Kidney stones   . Peripheral vascular disease ([place])   . Right carotid bruit   . SMOKER 11/30/2009  . Stroke ([place]) 2012  . URINARY CALCULUS 11/30/2009    Past Surgical History:  Procedure Laterality Date  . arthroscopy of right shoulder Right    Jan 2019, June 2019-- done at [facility], Dr. Ninfa Linden  . CYSTOSCOPY    . FACIAL COSMETIC SURGERY  2014  . FINGER SURGERY  2002  . NECK SURGERY  1986  . ROTATOR CUFF REPAIR Left   . UMBILICAL HERNIA REPAIR      Family History  Problem Relation Age of Onset  . Diabetes Sister   . Diabetes Mellitus I Sister   . Stroke Father   . Heart  disease Father   . CVA Father   . Coronary artery disease Father   . Diabetes Mother   . Heart disease Mother   . Diabetes Mellitus I Mother   . Coronary artery disease Mother   . Coronary artery disease Brother   . Heart disease Brother   . Coronary artery disease Brother     SOCIAL HISTORY: Social History   Socioeconomic History  . Marital status: Married    Spouse name: [name]   . Number of children: 3  . Years of education: 18  . Highest education level: Not on file  Occupational History  . Occupation: Truck Education administrator: [employer]  . Financial resource strain: Not on file  . Food insecurity:    Worry: Not on file    Inability: Not on file  . Transportation needs:    Medical: Not on file    Non-medical: Not on file  Tobacco Use  . Smoking status: Former Smoker    Years: 40.00    Types: Cigarettes    Last attempt to quit: 01/01/2013    Years since quitting: 5.4  . Smokeless tobacco: Former Systems developer  . Tobacco comment: Quit 12-2012  Substance and Sexual Activity  . Alcohol use: No  .  Drug use: No  . Sexual activity: Not on file  Lifestyle  . Physical activity:    Days per week: Not on file    Minutes per session: Not on file  . Stress: Not on file  Relationships  . Social connections:    Talks on phone: Not on file    Gets together: Not on file    Attends religious service: Not on file    Active member of club or organization: Not on file    Attends meetings of clubs or organizations: Not on file    Relationship status: Not on file  . Intimate partner violence:    Fear of current or ex partner: Not on file    Emotionally abused: Not on file    Physically abused: Not on file    Forced sexual activity: Not on file  Other Topics Concern  . Not on file  Social History Narrative   . Patient drinks 2-3 cups of caffeine daily.    Patient lives at home with his wife [name].    Patient works at [employer].    Education. 12 th grade   Right handed                  Allergies  Allergen Reactions  . Codeine Other (See Comments)    UNSPECIFIED REACTION  . Liraglutide Nausea And Vomiting    Current Outpatient Medications  Medication Sig Dispense Refill  . amLODipine-benazepril (LOTREL) 10-40 MG per capsule Take 1 capsule by mouth daily.      [name] aspirin (BAYER ASPIRIN) 325 MG tablet Take 1 tablet (325 mg total) by mouth daily. 30 tablet 6  . gabapentin (NEURONTIN) 300 MG capsule TAKE 1 CAPSULE BY MOUTH THREE TIMES A DAY (Patient taking differently: Take 300 mg by mouth 3 (three) times daily. ) 270 capsule 3  . hydrochlorothiazide (HYDRODIURIL) 25 MG tablet Take 25 mg by mouth daily.      . metFORMIN (GLUCOPHAGE) 500 MG tablet 2 tablets by mouth twice daily (Patient taking differently: Take 1,000 mg by mouth 2 (two) times daily with a meal. ) 360 tablet 3  . metoprolol succinate (TOPROL-XL) 50 MG 24 hr tablet Take 50 mg by mouth daily. Take with or immediately following a meal.    . pioglitazone (ACTOS) 45 MG tablet TAKE 1 TABLET DAILY (NEED APPOINTMENT FOR FURTHER REFILLS) (Patient taking differently: Take 45 mg by mouth daily. ) 90 tablet 0  . repaglinide (PRANDIN) 0.5 MG tablet Take 0.5 mg by mouth 3 (three) times daily before meals.   12  . rosuvastatin (CRESTOR) 40 MG tablet Take 40 mg by mouth daily.     . TRESIBA FLEXTOUCH 100 UNIT/ML SOPN FlexTouch Pen Inject 60 Units into the skin daily.   12   No current facility-administered medications for this visit.     REVIEW OF SYSTEMS:  [X]  denotes positive finding, [ ]  denotes negative finding Cardiac  Comments:  Chest pain or chest pressure:    Shortness of breath upon exertion:    Short of breath when lying flat:    Irregular heart rhythm:        Vascular    Pain in calf, thigh, or hip brought on by ambulation: x   Pain in feet at night that wakes you up from your sleep:  x   Blood clot in your veins:    Leg swelling:         Pulmonary    Oxygen at home:  Productive cough:      Wheezing:         Neurologic    Sudden weakness in arms or legs:     Sudden numbness in arms or legs:     Sudden onset of difficulty speaking or slurred speech:    Temporary loss of vision in one eye:     Problems with dizziness:         Gastrointestinal    Blood in stool:     Vomited blood:         Genitourinary    Burning when urinating:     Blood in urine:        Psychiatric    Major depression:         Hematologic    Bleeding problems:    Problems with blood clotting too easily:        Skin    Rashes or ulcers:        Constitutional    Fever or chills:      PHYSICAL EXAM: Vitals:   06/10/18 1014  BP: (!) 156/69  Pulse: 61  Resp: 18  SpO2: 98%  Weight: 183 lb (83 kg)  Height: 5\' 9"  (1.753 m)    GENERAL: The patient is a well-nourished male, in no acute distress. The vital signs are documented above. CARDIAC: There is a regular rate and rhythm.  VASCULAR:  2+ palpable radial pulse bilateral upper extremities 1+ palpable femoral pulses bilateral groins No palpable pedal pulses Monophasic R DP/PT signals PULMONARY: There is good air exchange bilaterally without wheezing or rales. ABDOMEN: Soft and non-tender with normal pitched bowel sounds.  MUSCULOSKELETAL: There are no major deformities or cyanosis. NEUROLOGIC: No focal weakness or paresthesias are detected.  DATA:   Reviewed his noninvasive imaging that shows an ABI of 0.75 on the right and 0.5 on the left.  Biphasic signals in the right, monophasic signals in the left  Assessment/Plan:  I suspect Mr. Strole symptoms are consistent with peripheral vascular disease even in the setting of his peripheral neuropathy.  He does endorse a history of claudication in the past and now his symptoms concerning for early rest pain in the left foot.  He states his left leg is much worse than his right leg which correlates with his ABIs.  He states the foot now aches and keeps him awake at night in addition to  the chronic numbness.  I have offered him aortogram with bilateral lower extremity arteriogram.  He does have 1+ palpable femoral pulses and may have some iliac disease but will have to evaluate with arteriogram and possible intervention.   Marty Heck, MD [facility] Office: [phone] Pager: [phone]

## 2018-06-18 ENCOUNTER — Ambulatory Visit (HOSPITAL_COMMUNITY)
Admission: RE | Admit: 2018-06-18 | Discharge: 2018-06-18 | Disposition: A | Discharge status: Home / Self Care | Payer: Medicare Other | Attending: Vascular Surgery | Admitting: Vascular Surgery

## 2018-06-18 ENCOUNTER — Encounter (HOSPITAL_COMMUNITY)
Admission: RE | Disposition: A | Discharge status: Home / Self Care | Payer: Self-pay | Source: Home / Self Care | Attending: Vascular Surgery

## 2018-06-18 ENCOUNTER — Encounter (HOSPITAL_COMMUNITY): Payer: Self-pay | Admitting: Vascular Surgery

## 2018-06-18 ENCOUNTER — Other Ambulatory Visit: Payer: Self-pay | Admitting: Neurological Surgery

## 2018-06-18 ENCOUNTER — Telehealth: Payer: Self-pay | Admitting: Vascular Surgery

## 2018-06-18 ENCOUNTER — Other Ambulatory Visit (HOSPITAL_COMMUNITY): Payer: Self-pay | Admitting: *Deleted

## 2018-06-18 ENCOUNTER — Other Ambulatory Visit: Payer: Self-pay

## 2018-06-18 DIAGNOSIS — Z7984 Long term (current) use of oral hypoglycemic drugs: Secondary | ICD-10-CM | POA: Insufficient documentation

## 2018-06-18 DIAGNOSIS — Z79899 Other long term (current) drug therapy: Secondary | ICD-10-CM | POA: Diagnosis not present

## 2018-06-18 DIAGNOSIS — Z8249 Family history of ischemic heart disease and other diseases of the circulatory system: Secondary | ICD-10-CM | POA: Diagnosis not present

## 2018-06-18 DIAGNOSIS — E7800 Pure hypercholesterolemia, unspecified: Secondary | ICD-10-CM | POA: Diagnosis not present

## 2018-06-18 DIAGNOSIS — Z888 Allergy status to other drugs, medicaments and biological substances status: Secondary | ICD-10-CM | POA: Insufficient documentation

## 2018-06-18 DIAGNOSIS — Z87891 Personal history of nicotine dependence: Secondary | ICD-10-CM | POA: Diagnosis not present

## 2018-06-18 DIAGNOSIS — Z885 Allergy status to narcotic agent status: Secondary | ICD-10-CM | POA: Insufficient documentation

## 2018-06-18 DIAGNOSIS — I70212 Atherosclerosis of native arteries of extremities with intermittent claudication, left leg: Secondary | ICD-10-CM | POA: Diagnosis not present

## 2018-06-18 DIAGNOSIS — Z833 Family history of diabetes mellitus: Secondary | ICD-10-CM | POA: Diagnosis not present

## 2018-06-18 DIAGNOSIS — Z8673 Personal history of transient ischemic attack (TIA), and cerebral infarction without residual deficits: Secondary | ICD-10-CM | POA: Insufficient documentation

## 2018-06-18 DIAGNOSIS — N400 Enlarged prostate without lower urinary tract symptoms: Secondary | ICD-10-CM | POA: Insufficient documentation

## 2018-06-18 DIAGNOSIS — E1151 Type 2 diabetes mellitus with diabetic peripheral angiopathy without gangrene: Secondary | ICD-10-CM | POA: Insufficient documentation

## 2018-06-18 DIAGNOSIS — M1990 Unspecified osteoarthritis, unspecified site: Secondary | ICD-10-CM | POA: Insufficient documentation

## 2018-06-18 DIAGNOSIS — E1140 Type 2 diabetes mellitus with diabetic neuropathy, unspecified: Secondary | ICD-10-CM | POA: Diagnosis not present

## 2018-06-18 DIAGNOSIS — Z823 Family history of stroke: Secondary | ICD-10-CM | POA: Insufficient documentation

## 2018-06-18 DIAGNOSIS — I10 Essential (primary) hypertension: Secondary | ICD-10-CM | POA: Diagnosis not present

## 2018-06-18 DIAGNOSIS — Z7982 Long term (current) use of aspirin: Secondary | ICD-10-CM | POA: Insufficient documentation

## 2018-06-18 HISTORY — PX: PERIPHERAL VASCULAR INTERVENTION: CATH118257

## 2018-06-18 HISTORY — PX: ABDOMINAL AORTOGRAM W/LOWER EXTREMITY: CATH118223

## 2018-06-18 LAB — POCT I-STAT 4, (NA,K, GLUC, HGB,HCT)
GLUCOSE: 137 mg/dL — AB (ref 70–99)
HEMATOCRIT: 41 % (ref 39.0–52.0)
Hemoglobin: 13.9 g/dL (ref 13.0–17.0)
Potassium: 3.6 mmol/L (ref 3.5–5.1)
Sodium: 140 mmol/L (ref 135–145)

## 2018-06-18 LAB — GLUCOSE, CAPILLARY
GLUCOSE-CAPILLARY: 77 mg/dL (ref 70–99)
Glucose-Capillary: 136 mg/dL — ABNORMAL HIGH (ref 70–99)

## 2018-06-18 LAB — POCT I-STAT CREATININE: Creatinine, Ser: 0.8 mg/dL (ref 0.61–1.24)

## 2018-06-18 SURGERY — ABDOMINAL AORTOGRAM W/LOWER EXTREMITY
Anesthesia: LOCAL

## 2018-06-18 MED ORDER — ACETAMINOPHEN 325 MG PO TABS: 650.0000 mg | ORAL_TABLET | ORAL | Status: DC | PRN

## 2018-06-18 MED ORDER — CLOPIDOGREL BISULFATE 75 MG PO TABS: 75.0000 mg | ORAL_TABLET | Freq: Every day | ORAL | 11 refills | Status: DC

## 2018-06-18 MED ORDER — SODIUM CHLORIDE 0.9 % IV SOLN: 250.0000 mL | INTRAVENOUS | Status: DC | PRN

## 2018-06-18 MED ORDER — LIDOCAINE HCL (PF) 1 % IJ SOLN
INTRAMUSCULAR | Status: DC | PRN
  Administered 2018-06-18: 15 mL

## 2018-06-18 MED ORDER — ONDANSETRON HCL 4 MG/2ML IJ SOLN: 4.0000 mg | Freq: Four times a day (QID) | INTRAMUSCULAR | Status: DC | PRN

## 2018-06-18 MED ORDER — SODIUM CHLORIDE 0.9 % IV SOLN
INTRAVENOUS | Status: DC
  Administered 2018-06-18: 07:00:00 via INTRAVENOUS

## 2018-06-18 MED ORDER — MIDAZOLAM HCL 2 MG/2ML IJ SOLN
INTRAMUSCULAR | Status: DC | PRN
  Administered 2018-06-18: 1 mg via INTRAVENOUS

## 2018-06-18 MED ORDER — IODIXANOL 320 MG/ML IV SOLN
INTRAVENOUS | Status: DC | PRN
  Administered 2018-06-18: 140 mL via INTRA_ARTERIAL

## 2018-06-18 MED ORDER — HEPARIN SODIUM (PORCINE) 1000 UNIT/ML IJ SOLN
INTRAMUSCULAR | Status: AC
  Filled 2018-06-18: qty 1

## 2018-06-18 MED ORDER — SODIUM CHLORIDE 0.9 % WEIGHT BASED INFUSION: 1.0000 mL/kg/h | INTRAVENOUS | Status: DC

## 2018-06-18 MED ORDER — SODIUM CHLORIDE 0.9% FLUSH: 3.0000 mL | Freq: Two times a day (BID) | INTRAVENOUS | Status: DC

## 2018-06-18 MED ORDER — LIDOCAINE HCL (PF) 1 % IJ SOLN
INTRAMUSCULAR | Status: AC
  Filled 2018-06-18: qty 30

## 2018-06-18 MED ORDER — LABETALOL HCL 5 MG/ML IV SOLN: 10.0000 mg | INTRAVENOUS | Status: DC | PRN

## 2018-06-18 MED ORDER — CLOPIDOGREL BISULFATE 75 MG PO TABS: 75.0000 mg | ORAL_TABLET | Freq: Every day | ORAL | Status: DC

## 2018-06-18 MED ORDER — MIDAZOLAM HCL 2 MG/2ML IJ SOLN
INTRAMUSCULAR | Status: AC
  Filled 2018-06-18: qty 2

## 2018-06-18 MED ORDER — FENTANYL CITRATE (PF) 100 MCG/2ML IJ SOLN
INTRAMUSCULAR | Status: DC | PRN
  Administered 2018-06-18: 50 ug via INTRAVENOUS
  Administered 2018-06-18: 25 ug via INTRAVENOUS

## 2018-06-18 MED ORDER — CLOPIDOGREL BISULFATE 75 MG PO TABS: 300.0000 mg | ORAL_TABLET | Freq: Once | ORAL | Status: DC

## 2018-06-18 MED ORDER — CLOPIDOGREL BISULFATE 300 MG PO TABS
ORAL_TABLET | ORAL | Status: AC
  Filled 2018-06-18: qty 1

## 2018-06-18 MED ORDER — HEPARIN SODIUM (PORCINE) 1000 UNIT/ML IJ SOLN
INTRAMUSCULAR | Status: DC | PRN
  Administered 2018-06-18: 9000 [IU] via INTRAVENOUS

## 2018-06-18 MED ORDER — FENTANYL CITRATE (PF) 100 MCG/2ML IJ SOLN
INTRAMUSCULAR | Status: AC
  Filled 2018-06-18: qty 2

## 2018-06-18 MED ORDER — HEPARIN (PORCINE) IN NACL 1000-0.9 UT/500ML-% IV SOLN
INTRAVENOUS | Status: AC
  Filled 2018-06-18: qty 1000

## 2018-06-18 MED ORDER — HEPARIN (PORCINE) IN NACL 1000-0.9 UT/500ML-% IV SOLN
INTRAVENOUS | Status: DC | PRN
  Administered 2018-06-18 (×2): 500 mL

## 2018-06-18 MED ORDER — SODIUM CHLORIDE 0.9% FLUSH: 3.0000 mL | INTRAVENOUS | Status: DC | PRN

## 2018-06-18 MED ORDER — CLOPIDOGREL BISULFATE 300 MG PO TABS
ORAL_TABLET | ORAL | Status: DC | PRN
  Administered 2018-06-18: 300 mg via ORAL

## 2018-06-18 MED ORDER — HYDRALAZINE HCL 20 MG/ML IJ SOLN: 5.0000 mg | INTRAMUSCULAR | Status: DC | PRN

## 2018-06-18 SURGICAL SUPPLY — 18 items
BALLN MUSTANG 6.0X40 135 (BALLOONS) ×3
BALLOON MUSTANG 6.0X40 135 (BALLOONS) ×2 IMPLANT
CATH OMNI FLUSH 5F 65CM (CATHETERS) ×3 IMPLANT
CLOSURE MYNX CONTROL 6F/7F (Vascular Products) ×3 IMPLANT
GLIDEWIRE ADV .035X260CM (WIRE) ×3 IMPLANT
KIT ENCORE 26 ADVANTAGE (KITS) ×3 IMPLANT
KIT MICROPUNCTURE NIT STIFF (SHEATH) ×3 IMPLANT
KIT PV (KITS) ×3 IMPLANT
SHEATH FLEX ANSEL ANG 6F 45CM (SHEATH) ×3 IMPLANT
SHEATH PINNACLE 5F 10CM (SHEATH) ×3 IMPLANT
SHEATH PINNACLE 6F 10CM (SHEATH) ×3 IMPLANT
SHEATH PROBE COVER 6X72 (BAG) ×3 IMPLANT
STENT INNOVA 7X40X130 (Permanent Stent) ×3 IMPLANT
SYR MEDRAD MARK V 150ML (SYRINGE) ×3 IMPLANT
TRANSDUCER W/STOPCOCK (MISCELLANEOUS) ×3 IMPLANT
TRAY PV CATH (CUSTOM PROCEDURE TRAY) ×3 IMPLANT
WIRE BENTSON .035X145CM (WIRE) ×3 IMPLANT
WIRE ROSEN-J .035X260CM (WIRE) ×3 IMPLANT

## 2018-06-18 NOTE — H&P (Signed)
History and Physical Interval Note:  06/18/2018 8:08 AM  Luis Lindsey  has presented today for surgery, with the diagnosis of pvd - claudication  The various methods of treatment have been discussed with the patient and family. After consideration of risks, benefits and other options for treatment, the patient has consented to  Procedure(s): ABDOMINAL AORTOGRAM W/LOWER EXTREMITY (N/A) as a surgical intervention .  The patient's history has been reviewed, patient examined, no change in status, stable for surgery.  I have reviewed the patient's chart and labs.  Questions were answered to the patient's satisfaction.    Left leg claudication, rest pain.  Aortogram/LLE arteriogram.  Marty Heck  Patient name: Luis Lindsey  MRN: [id number]        DOB: 1951-03-17            Sex: male  REASON FOR CONSULT: Left leg pain with tingling and numbness  HPI: Luis Lindsey is a 68 y.o. male, with history of hypertension, hypercholesterolemia, diabetes that presents for evaluation of left leg pain.  It appears that he was recently seen by neurology for evaluation of neuropathy.  During that appointment the patient did get noninvasive imaging ordered shows an ABI of 0.59 in the left leg versus 0.75 in the right leg.  Ultimately he was referred to vascular surgery.  He states he had significant pain in his left foot for several years now.  He does endorse a history of claudication with burning in his left calf for several years.  States the left leg is much more severe than the right.  Most recently he now has an aching and tingling in the foot that keeps him awake at night.  He has had no lower extremity interventions.  He does smoke cigars but not cigarettes.  He does take an aspirin and a statin.      Past Medical History:  Diagnosis Date  . Arthritis   . BCC (basal cell carcinoma)   . BPH (benign prostatic hyperplasia)   . CAROTID BRUIT, RIGHT 11/30/2009  . DM 11/30/2009  . Heart murmur   .  History of Holter monitoring 2009  . History of kidney stones   . HYPERCHOLESTEROLEMIA 11/30/2009  . HYPERTENSION 11/30/2009  . Kidney stones   . Peripheral vascular disease ([place])   . Right carotid bruit   . SMOKER 11/30/2009  . Stroke ([place]) 2012  . URINARY CALCULUS 11/30/2009         Past Surgical History:  Procedure Laterality Date  . arthroscopy of right shoulder Right    Jan 2019, June 2019-- done at [place], Dr. Ninfa Linden  . CYSTOSCOPY    . FACIAL COSMETIC SURGERY  2014  . FINGER SURGERY  2002  . NECK SURGERY  1986  . ROTATOR CUFF REPAIR Left   . UMBILICAL HERNIA REPAIR           Family History  Problem Relation Age of Onset  . Diabetes Sister   . Diabetes Mellitus I Sister   . Stroke Father   . Heart disease Father   . CVA Father   . Coronary artery disease Father   . Diabetes Mother   . Heart disease Mother   . Diabetes Mellitus I Mother   . Coronary artery disease Mother   . Coronary artery disease Brother   . Heart disease Brother   . Coronary artery disease Brother     SOCIAL HISTORY: Social History  Socioeconomic History  . Marital status: Married    Spouse name: [name]   . Number of children: 3  . Years of education: 37  . Highest education level: Not on file  Occupational History  . Occupation: Truck Education administrator: [employer]  . Financial resource strain: Not on file  . Food insecurity:    Worry: Not on file    Inability: Not on file  . Transportation needs:    Medical: Not on file    Non-medical: Not on file  Tobacco Use  . Smoking status: Former Smoker    Years: 40.00    Types: Cigarettes    Last attempt to quit: 01/01/2013    Years since quitting: 5.4  . Smokeless tobacco: Former Systems developer  . Tobacco comment: Quit 12-2012  Substance and Sexual Activity  . Alcohol use: No  . Drug use: No  . Sexual activity: Not on file  Lifestyle  . Physical  activity:    Days per week: Not on file    Minutes per session: Not on file  . Stress: Not on file  Relationships  . Social connections:    Talks on phone: Not on file    Gets together: Not on file    Attends religious service: Not on file    Active member of club or organization: Not on file    Attends meetings of clubs or organizations: Not on file    Relationship status: Not on file  . Intimate partner violence:    Fear of current or ex partner: Not on file    Emotionally abused: Not on file    Physically abused: Not on file    Forced sexual activity: Not on file  Other Topics Concern  . Not on file  Social History Narrative   . Patient drinks 2-3 cups of caffeine daily.    Patient lives at home with his wife [name].    Patient works at [employer].    Education. 12 th grade   Right handed                     Allergies  Allergen Reactions  . Codeine Other (See Comments)    UNSPECIFIED REACTION  . Liraglutide Nausea And Vomiting          Current Outpatient Medications  Medication Sig Dispense Refill  . amLODipine-benazepril (LOTREL) 10-40 MG per capsule Take 1 capsule by mouth daily.      [name] aspirin (BAYER ASPIRIN) 325 MG tablet Take 1 tablet (325 mg total) by mouth daily. 30 tablet 6  . gabapentin (NEURONTIN) 300 MG capsule TAKE 1 CAPSULE BY MOUTH THREE TIMES A DAY (Patient taking differently: Take 300 mg by mouth 3 (three) times daily. ) 270 capsule 3  . hydrochlorothiazide (HYDRODIURIL) 25 MG tablet Take 25 mg by mouth daily.      . metFORMIN (GLUCOPHAGE) 500 MG tablet 2 tablets by mouth twice daily (Patient taking differently: Take 1,000 mg by mouth 2 (two) times daily with a meal. ) 360 tablet 3  . metoprolol succinate (TOPROL-XL) 50 MG 24 hr tablet Take 50 mg by mouth daily. Take with or immediately following a meal.    . pioglitazone (ACTOS) 45 MG tablet TAKE 1 TABLET DAILY (NEED APPOINTMENT FOR FURTHER REFILLS)  (Patient taking differently: Take 45 mg by mouth daily. ) 90 tablet 0  . repaglinide (PRANDIN) 0.5 MG tablet Take 0.5 mg by mouth 3 (three) times daily before  meals.   12  . rosuvastatin (CRESTOR) 40 MG tablet Take 40 mg by mouth daily.     . TRESIBA FLEXTOUCH 100 UNIT/ML SOPN FlexTouch Pen Inject 60 Units into the skin daily.   12   No current facility-administered medications for this visit.     REVIEW OF SYSTEMS:  [X]  denotes positive finding, [ ]  denotes negative finding Cardiac  Comments:  Chest pain or chest pressure:    Shortness of breath upon exertion:    Short of breath when lying flat:    Irregular heart rhythm:        Vascular    Pain in calf, thigh, or hip brought on by ambulation: x   Pain in feet at night that wakes you up from your sleep:  x   Blood clot in your veins:    Leg swelling:         Pulmonary    Oxygen at home:    Productive cough:     Wheezing:         Neurologic    Sudden weakness in arms or legs:     Sudden numbness in arms or legs:     Sudden onset of difficulty speaking or slurred speech:    Temporary loss of vision in one eye:     Problems with dizziness:         Gastrointestinal    Blood in stool:     Vomited blood:         Genitourinary    Burning when urinating:     Blood in urine:        Psychiatric    Major depression:         Hematologic    Bleeding problems:    Problems with blood clotting too easily:        Skin    Rashes or ulcers:        Constitutional    Fever or chills:      PHYSICAL EXAM:    Vitals:   06/10/18 1014  BP: (!) 156/69  Pulse: 61  Resp: 18  SpO2: 98%  Weight: 183 lb (83 kg)  Height: 5\' 9"  (1.753 m)    GENERAL: The patient is a well-nourished male, in no acute distress. The vital signs are documented above. CARDIAC: There is a regular rate and rhythm.  VASCULAR:  2+ palpable radial  pulse bilateral upper extremities 1+ palpable femoral pulses bilateral groins No palpable pedal pulses Monophasic R DP/PT signals PULMONARY: There is good air exchange bilaterally without wheezing or rales. ABDOMEN: Soft and non-tender with normal pitched bowel sounds.  MUSCULOSKELETAL: There are no major deformities or cyanosis. NEUROLOGIC: No focal weakness or paresthesias are detected.  DATA:   Reviewed his noninvasive imaging that shows an ABI of 0.75 on the right and 0.5 on the left.  Biphasic signals in the right, monophasic signals in the left  Assessment/Plan:  I suspect Mr. Broad symptoms are consistent with peripheral vascular disease even in the setting of his peripheral neuropathy.  He does endorse a history of claudication in the past and now his symptoms concerning for early rest pain in the left foot.  He states his left leg is much worse than his right leg which correlates with his ABIs.  He states the foot now aches and keeps him awake at night in addition to the chronic numbness.  I have offered him aortogram with bilateral lower extremity arteriogram.  He does have 1+ palpable femoral  pulses and may have some iliac disease but will have to evaluate with arteriogram and possible intervention.   Marty Heck, MD [facility] Office: [phone] Pager: [phone]

## 2018-06-18 NOTE — Progress Notes (Signed)
Up to bedside voices understanding of discharge instructions. No c/o pain or discomfort voiced

## 2018-06-18 NOTE — Discharge Instructions (Signed)
Femoral Site Care °This sheet gives you information about how to care for yourself after your procedure. Your health care provider may also give you more specific instructions. If you have problems or questions, contact your health care provider. °What can I expect after the procedure? °After the procedure, it is common to have: °· Bruising that usually fades within 1-2 weeks. °· Tenderness at the site. °Follow these instructions at home: °Wound care °· Follow instructions from your health care provider about how to take care of your insertion site. Make sure you: °? Wash your hands with soap and water before you change your bandage (dressing). If soap and water are not available, use hand sanitizer. °? Change your dressing as told by your health care provider. °? Leave stitches (sutures), skin glue, or adhesive strips in place. These skin closures may need to stay in place for 2 weeks or longer. If adhesive strip edges start to loosen and curl up, you may trim the loose edges. Do not remove adhesive strips completely unless your health care provider tells you to do that. °· Do not take baths, swim, or use a hot tub until your health care provider approves. °· You may shower 24-48 hours after the procedure or as told by your health care provider. °? Gently wash the site with plain soap and water. °? Pat the area dry with a clean towel. °? Do not rub the site. This may cause bleeding. °· Do not apply powder or lotion to the site. Keep the site clean and dry. °· Check your femoral site every day for signs of infection. Check for: °? Redness, swelling, or pain. °? Fluid or blood. °? Warmth. °? Pus or a bad smell. °Activity °· For the first 2-3 days after your procedure, or as long as directed: °? Avoid climbing stairs as much as possible. °? Do not squat. °· Do not lift anything that is heavier than 10 lb (4.5 kg), or the limit that you are told, until your health care provider says that it is safe. °· Rest as  directed. °? Avoid sitting for a long time without moving. Get up to take short walks every 1-2 hours. °· Do not drive for 24 hours if you were given a medicine to help you relax (sedative). °General instructions °· Take over-the-counter and prescription medicines only as told by your health care provider. °· Keep all follow-up visits as told by your health care provider. This is important. °Contact a health care provider if you have: °· A fever or chills. °· You have redness, swelling, or pain around your insertion site. °Get help right away if: °· The catheter insertion area swells very fast. °· You pass out. °· You suddenly start to sweat or your skin gets clammy. °· The catheter insertion area is bleeding, and the bleeding does not stop when you hold steady pressure on the area. °· The area near or just beyond the catheter insertion site becomes pale, cool, tingly, or numb. °These symptoms may represent a serious problem that is an emergency. Do not wait to see if the symptoms will go away. Get medical help right away. Call your local emergency services (911 in the [place] Do not drive yourself to the hospital. °Summary °· After the procedure, it is common to have bruising that usually fades within 1-2 weeks. °· Check your femoral site every day for signs of infection. °· Do not lift anything that is heavier than 10 lb (4.5 kg), or the   limit that you are told, until your health care provider says that it is safe. °This information is not intended to replace advice given to you by your health care provider. Make sure you discuss any questions you have with your health care provider. °Document Released: 01/15/2014 Document Revised: 05/27/2017 Document Reviewed: 05/27/2017 °Elsevier Interactive Patient Education © 2019 [employer] ° °

## 2018-06-18 NOTE — Telephone Encounter (Signed)
-----   Message from Marty Heck, MD sent at 06/18/2018  9:53 AM EST ----- Patient name: Luis Lindsey  MRN: [id number]        DOB: 1950/10/07            Sex: male  06/18/2018 Pre-operative Diagnosis: Left lower extremity short distance claudication in setting of chronic lower extremity neuropathy Post-operative diagnosis:  Same Surgeon:  Marty Heck, MD Procedure Performed: 1.  Ultrasound-guided access of the right common femoral artery 2.  Aortogram with bilateral lower extremity arteriogram 3.  Left external iliac artery stent placement with angioplasty (7 mm x 40 mm self-expanding Innova that was postdilated with a 6 mm x 40 mm Mustang) 4.  Mynx closure of the right common femoral artery 5.  54 minutes of monitored moderate conscious sedation time  #He can follow-up with me in one month with iliac duplex and ABI's.  Thanks,  Gerald Stabs

## 2018-06-18 NOTE — Op Note (Signed)
Patient name: DESHAN HEMMELGARN MRN: [id number] DOB: 06/25/1950 Sex: male  06/18/2018 Pre-operative Diagnosis: Left lower extremity short distance claudication in setting of chronic lower extremity neuropathy Post-operative diagnosis:  Same Surgeon:  Marty Heck, MD Procedure Performed: 1.  Ultrasound-guided access of the right common femoral artery 2.  Aortogram with bilateral lower extremity arteriogram 3.  Left external iliac artery stent placement with angioplasty (7 mm x 40 mm self-expanding Innova that was postdilated with a 6 mm x 40 mm Mustang) 4.  Mynx closure of the right common femoral artery 5.  54 minutes of monitored moderate conscious sedation time  Indications: Patient is a 68 year old male who presented to clinic last week complaining of a long history of left lower extremity claudication.  He also had some atypical symptoms of pain and numbness in his left foot.  He was recently seen by neurology for history of neuropathy and had noninvasive imaging that showed a reduced ABI 0.5 in the left lower extremity.  He presents today for aortogram, lower extreme arteriogram, and possible intervention after risk and benefits were discussed.  Findings: Aortogram showed patent bilateral single renal arteries.  His infrarenal aorta was small but patent.  Bilateral common iliac arteries also small but patent.  He had greater than 50% stenosis of bilateral hypogastric arteries.  His left external iliac artery had a 60 to 70% stenosis in the midportion of the artery well above the femoral head. Left lower extremity arteriogram revealed calcification in the distal common femoral with a focal stenosis of the proximal profunda and SFA of approximate 50% each.  The SFA was patent throughout its course although heavily calcified and diseased and very small and measured less than 3 mm.  The above and below-knee popliteal artery as well as the trifurcation was patent.  Dominant runoff into the  left lower extremity appeared to be through the posterior tibial artery even though the anterior tibial and peroneal was patent and sluggish. Right lower extremity arteriogram showed a patent common femoral, profunda, SFA as well as above and below-knee popliteal artery patent trifurcation and three-vessel runoff.   All of his vessels appeared relatively small throughout their course.   Procedure:  The patient was identified in the holding area and taken to room 8.  The patient was then placed supine on the table and prepped and draped in the usual sterile fashion.  A time out was called.  Ultrasound was used to evaluate the right common femoral artery.  It was patent .  A digital ultrasound image was acquired.  A micropuncture needle was used to access the right common femoral artery under ultrasound guidance.  An 018 wire was advanced without resistance and a micropuncture sheath was placed.  The 018 wire was removed and a benson wire was placed.  The micropuncture sheath was exchanged for a 5 french sheath.  An omniflush catheter was advanced over the wire to the level of L-1.  An abdominal angiogram was obtained.  Was obtained some right oblique imaging to further evaluate the left external iliac artery where we saw a focal stenosis.  Next the Omni Flush catheter was pulled down to the aortic bifurcation and bilateral lower extremity arteriogram with runoff was obtained with pertinent findings noted above.  After review of imaging we elected to intervene on the left external iliac stenosis.  I confirmed with the patient on the table what his symptoms were and he was complaining of left calf pain while walking that  usually resolves with rest.  Given that I do not think he has critical limb ischemia at this time and the fact that his SFA is very small and measures less than 3 mm any intervention on his SFA at this time is very high risk for causing a dissection and then if we were forced to stent a very small  artery it would likely have poor long term patency.  As a result I elected to intervene on the left external iliac stenosis as noted above.  I then used a Glidewire advantage and Omni Flush catheter to cross the aortic bifurcation and advance my wire down into the left SFA.  I then exchanged for a long 6 [place] Ansell sheath in the right groin that was placed over the aortic bifurcation and across the left external iliac stenosis.  I then gave the patient 9000 units of IV heparin.  I then obtained some focal left anterior oblique imaging of the SFA and profunda takeoff just to better evaluate some calcific stenosis here noted above.  I then pulled my sheath back above the iliac stenosis and selected a 7 mm x 40 mm self-expanding Innova stent that was deployed across the midportion of the stenosis in the left external iliac while preserving the hypogastric artery on the left.  This was then postdilated with a 6 mm x 40 mm Mustang.  Injection through the sheath after intervention showed less than 30% residual stenosis of the mid left external iliac stenosis.  I then obtained a runoff shot of the left lower extremity that showed patent popliteal as well as patent trifurcation and the patient had brisk posterior tibial signal with a more sluggish dorsalis pedis signal.  His left femoral pulse was improved.  At that point in time our wires and catheters were removed.  I then placed a short 6 French sheath in the right groin.  A mynx closure device was deployed and additional manual pressure was held.  Plan: After left external iliac intervention today including stent placement, I would be hopeful that patient's left leg claudication symptoms improve.  If he has persistent problems moving forward next consideration would likely be left femoral endarterectomy with profundoplasty and patch of the SFA stenosis proximally.  Discussed my concern for left SFA intervention at this time given that the SFA measures less than 3  mm in diameter and is very calcified and likely too small for atherectomy and any angioplasty that required stenting would likely have poor patency given the small size of the vessel.     Marty Heck, MD [facility] Office: [phone] Pager: [place]

## 2018-06-18 NOTE — Progress Notes (Signed)
CBG 77 Peanut butter and crackers given to pt. States he feels good right now.

## 2018-06-18 NOTE — Telephone Encounter (Signed)
sch appt vm full mld ltr 07/18/2018 9am IVC/iliac 10am ABI       07/22/2018 915am p/o MD

## 2018-06-19 ENCOUNTER — Other Ambulatory Visit (HOSPITAL_COMMUNITY): Payer: Self-pay

## 2018-06-19 ENCOUNTER — Other Ambulatory Visit (HOSPITAL_COMMUNITY): Payer: Self-pay | Admitting: *Deleted

## 2018-06-19 NOTE — Pre-Procedure Instructions (Addendum)
Luis Lindsey  06/19/2018      [id number] [name], [place] [address] Phone: [phone] Fax: [phone]    Your procedure is scheduled on Thursday, January 30th.  Report to [facility] Admitting at 5:30 A.M.  Call this number if you have problems the morning of surgery:  [phone]   Remember:  Do not eat or drink after midnight.    Take these medicines the morning of surgery with A SIP OF WATER  gabapentin (NEURONTIN) metoprolol succinate (TOPROL-XL)  Follow your surgeon's instructions on when to stop/resume Plavix.  If no instructions were given by your surgeon then you will need to call the office to get those instructions.    As of today, STOP taking any Aspirin (unless otherwise instructed by your surgeon), Aleve, Naproxen, Ibuprofen, Motrin, Advil, Goody's, BC's, all herbal medications, fish oil, and all vitamins.   WHAT DO I DO ABOUT MY DIABETES MEDICATION?   [name] Do not take oral diabetes medicines (pills) the morning of surgery. Do NOT TAKE metFORMIN (GLUCOPHAGE), pioglitazone (ACTOS), repaglinide (PRANDIN).       . THE MORNING OF SURGERY, take 30 units of TRESIBA insulin.   How to Manage Your Diabetes Before and After Surgery  Why is it important to control my blood sugar before and after surgery? . Improving blood sugar levels before and after surgery helps healing and can limit problems. . A way of improving blood sugar control is eating a healthy diet by: o  Eating less sugar and carbohydrates o  Increasing activity/exercise o  Talking with your doctor about reaching your blood sugar goals . High blood sugars (greater than 180 mg/dL) can raise your risk of infections and slow your recovery, so you will need to focus on controlling your diabetes during the weeks before surgery. . Make sure that the doctor who takes care of your diabetes knows  about your planned surgery including the date and location.  How do I manage my blood sugar before surgery? . Check your blood sugar at least 4 times a day, starting 2 days before surgery, to make sure that the level is not too high or low. o Check your blood sugar the morning of your surgery when you wake up and every 2 hours until you get to the Short Stay unit. . If your blood sugar is less than 70 mg/dL, you will need to treat for low blood sugar: o Do not take insulin. o Treat a low blood sugar (less than 70 mg/dL) with  cup of clear juice (cranberry or apple), 4 glucose tablets, OR glucose gel. o Recheck blood sugar in 15 minutes after treatment (to make sure it is greater than 70 mg/dL). If your blood sugar is not greater than 70 mg/dL on recheck, call [phone] for further instructions. . Report your blood sugar to the short stay nurse when you get to Short Stay.  . If you are admitted to the hospital after surgery: o Your blood sugar will be checked by the staff and you will probably be given insulin after surgery (instead of oral diabetes medicines) to make sure you have good blood sugar levels. o The goal for blood sugar control after surgery is 80-180 mg/dL.      Do not wear jewelry.  Do not wear lotions, powders, or perfumes, or deodorant.  Men may shave face and neck.  Do not  bring valuables to the hospital.  [facility] is not responsible for any belongings or valuables.  Contacts, dentures or bridgework may not be worn into surgery.  Leave your suitcase in the car.  After surgery it may be brought to your room.  For patients admitted to the hospital, discharge time will be determined by your treatment team.  Patients discharged the day of surgery will not be allowed to drive home.   Special instructions:   [place]- Preparing For Surgery  Before surgery, you can play an important role. Because skin is not sterile, your skin needs to be as free of germs as  possible. You can reduce the number of germs on your skin by washing with CHG (chlorahexidine gluconate) Soap before surgery.  CHG is an antiseptic cleaner which kills germs and bonds with the skin to continue killing germs even after washing.    Oral Hygiene is also important to reduce your risk of infection.  Remember - BRUSH YOUR TEETH THE MORNING OF SURGERY WITH YOUR REGULAR TOOTHPASTE  Please do not use if you have an allergy to CHG or antibacterial soaps. If your skin becomes reddened/irritated stop using the CHG.  Do not shave (including legs and underarms) for at least 48 hours prior to first CHG shower. It is OK to shave your face.  Please follow these instructions carefully.   1. Shower the NIGHT BEFORE SURGERY and the MORNING OF SURGERY with CHG.   2. If you chose to wash your hair, wash your hair first as usual with your normal shampoo.  3. After you shampoo, rinse your hair and body thoroughly to remove the shampoo.  4. Use CHG as you would any other liquid soap. You can apply CHG directly to the skin and wash gently with a scrungie or a clean washcloth.   5. Apply the CHG Soap to your body ONLY FROM THE NECK DOWN.  Do not use on open wounds or open sores. Avoid contact with your eyes, ears, mouth and genitals (private parts). Wash Face and genitals (private parts)  with your normal soap.  6. Wash thoroughly, paying special attention to the area where your surgery will be performed.  7. Thoroughly rinse your body with warm water from the neck down.  8. DO NOT shower/wash with your normal soap after using and rinsing off the CHG Soap.  9. Pat yourself dry with a CLEAN TOWEL.  10. Wear CLEAN PAJAMAS to bed the night before surgery, wear comfortable clothes the morning of surgery  11. Place CLEAN SHEETS on your bed the night of your first shower and DO NOT SLEEP WITH PETS.    Day of Surgery:  Do not apply any deodorants/lotions.  Please wear clean clothes to the  hospital/surgery center.   Remember to brush your teeth WITH YOUR REGULAR TOOTHPASTE.   Please read over the following fact sheets that you were given.

## 2018-06-20 ENCOUNTER — Encounter (HOSPITAL_COMMUNITY): Payer: Self-pay

## 2018-06-20 ENCOUNTER — Encounter (HOSPITAL_COMMUNITY)
Admission: RE | Admit: 2018-06-20 | Discharge: 2018-06-20 | Disposition: A | Discharge status: Home / Self Care | Payer: Medicare Other | Source: Ambulatory Visit | Attending: Neurological Surgery | Admitting: Neurological Surgery

## 2018-06-20 ENCOUNTER — Ambulatory Visit (HOSPITAL_COMMUNITY)
Admission: RE | Admit: 2018-06-20 | Discharge: 2018-06-20 | Disposition: A | Discharge status: Home / Self Care | Payer: Medicare Other | Source: Ambulatory Visit | Attending: Neurological Surgery | Admitting: Neurological Surgery

## 2018-06-20 ENCOUNTER — Other Ambulatory Visit: Payer: Self-pay

## 2018-06-20 DIAGNOSIS — M5126 Other intervertebral disc displacement, lumbar region: Secondary | ICD-10-CM

## 2018-06-20 LAB — CBC WITH DIFFERENTIAL/PLATELET
Abs Immature Granulocytes: 0.05 10*3/uL (ref 0.00–0.07)
BASOS ABS: 0 10*3/uL (ref 0.0–0.1)
Basophils Relative: 0 %
Eosinophils Absolute: 0.1 10*3/uL (ref 0.0–0.5)
Eosinophils Relative: 1 %
HCT: 41 % (ref 39.0–52.0)
Hemoglobin: 13 g/dL (ref 13.0–17.0)
Immature Granulocytes: 1 %
Lymphocytes Relative: 22 %
Lymphs Abs: 1.8 10*3/uL (ref 0.7–4.0)
MCH: 28.1 pg (ref 26.0–34.0)
MCHC: 31.7 g/dL (ref 30.0–36.0)
MCV: 88.6 fL (ref 80.0–100.0)
Monocytes Absolute: 0.8 10*3/uL (ref 0.1–1.0)
Monocytes Relative: 10 %
Neutro Abs: 5.4 10*3/uL (ref 1.7–7.7)
Neutrophils Relative %: 66 %
Platelets: 338 10*3/uL (ref 150–400)
RBC: 4.63 MIL/uL (ref 4.22–5.81)
RDW: 15.1 % (ref 11.5–15.5)
WBC: 8.2 10*3/uL (ref 4.0–10.5)
nRBC: 0 % (ref 0.0–0.2)

## 2018-06-20 LAB — PROTIME-INR
INR: 0.98
Prothrombin Time: 12.9 seconds (ref 11.4–15.2)

## 2018-06-20 LAB — BASIC METABOLIC PANEL
Anion gap: 11 (ref 5–15)
BUN: 17 mg/dL (ref 8–23)
CO2: 23 mmol/L (ref 22–32)
Calcium: 9 mg/dL (ref 8.9–10.3)
Chloride: 102 mmol/L (ref 98–111)
Creatinine, Ser: 0.82 mg/dL (ref 0.61–1.24)
GFR calc Af Amer: >60 mL/min (ref >60)
GFR calc non Af Amer: >60 mL/min (ref >60)
Glucose, Bld: 271 mg/dL — ABNORMAL HIGH (ref 70–99)
Potassium: 3.8 mmol/L (ref 3.5–5.1)
Sodium: 136 mmol/L (ref 135–145)

## 2018-06-20 LAB — GLUCOSE, CAPILLARY: Glucose-Capillary: 244 mg/dL — ABNORMAL HIGH (ref 70–99)

## 2018-06-20 LAB — SURGICAL PCR SCREEN
MRSA, PCR: NEGATIVE
Staphylococcus aureus: NEGATIVE

## 2018-06-20 NOTE — Progress Notes (Signed)
PCP - Dr. [name] L. Mitchel Cardiologist - denies  Chest x-ray - 06/20/2018 EKG - 05/14/18 Stress Test - denies ECHO - denies Cardiac Cath - denies  Sleep Study - denies CPAP - N/A  Fasting Blood Sugar - 75-100 Checks Blood Sugar 1 time a day  Blood Thinner(Plavix)and Aspirin Instructions:Dr. Clark's office ([facility]) and Dr. Ronnald Ramp' office [facility]) notified, awaiting response  Anesthesia review: Yes, A1C 10.2  Patient denies shortness of breath, fever, cough and chest pain at PAT appointment  Patient verbalized understanding of instructions that were given to them at the PAT appointment. Patient was also instructed that they will need to review over the PAT instructions again at home before surgery.

## 2018-06-20 NOTE — Progress Notes (Addendum)
Pt presented today for pre-admission testing for surgery with Dr. Ronnald Ramp on 06/26/18. Pt recently started on ASA and Plavix after Abdominal aortogram with LE stenting by Dr. Carlis Abbott on 06/18/18. Pt states he started these medications 06/19/18. LD of both was this morning 06/20/18. Pt did not receive any instructions on when to stop Plavix and ASA prior to surgery. Pt states he doesn't know if Dr.Jones office is aware that he had the recent vascular procedure.   1018-Reached out to Dr. Ainsley Spinner office to ask if pt's meds can be held prior to surgery. Left VM with the clinical follow-up nurse with callback number. Awaiting response at this time.   1030-Spoke with Lorriane Shire at Dr. Ronnald Ramp office and notified her of pt's recent procedure and newly prescribed blood thinners. Lorriane Shire states she was not aware of this information and will reach out to patient regarding any future updates or changes.   Jacqlyn Larsen, RN

## 2018-06-23 NOTE — Progress Notes (Signed)
Anesthesia Chart Review:  Case:  [id number] Date/Time:  06/26/18 0715   Procedure:  Microdiscectomy - L4-L5 - left (Left Back)   Anesthesia type:  General   Pre-op diagnosis:  HNP   Location:  [facility] OR ROOM 20 / [place] OR   Surgeon:  Eustace Moore, MD      DISCUSSION: Patient is a 68 year old male scheduled for the above procedure. Surgery was initially scheduled for 05/15/18 and reviewed with anesthesiologist Roberts Gaudy, MD at that time. For unclear reasons, surgery was postponed and rescheduled for 06/26/18, but in the interim he was evaluated by vascular surgeon Monica Martinez, MD and was placed on ASA and Plavix following left EIA stent 06/18/18.   History includes former smoker (quit '14), HTN, DM2, PAD (s/p left EIA stent 06/18/18), CVA '12, right carotid bruit '11 ("unremarkable carotid Doppler" by [facility] records), murmur ("Grade 2/6 SEM" per 02/17/18 exam by Alroy Dust, [name], MD), facial cosmetic surgery (not specified) '14.   At his initial PAT visit 05/14/18, his A1c was 10.2, up from 9.4 (on 02/17/18-repaglinide 0.5 mg added before meals by PCP). He reported home fasting CBGs of ~ 125 in December, but now reporting ~ 75-100. A1c result called to Janett Billow at Dr. Ronnald Ramp' office at that time and again called to [facility] at his office this morning.  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 PCP classified murmur as 2/6 SEM. He denied SOB, cough, fever, and chest pain at PAT.  If patient has had an echo, report is not readily seen at [facility] or in [facility]. Patient was not still at his PAT visit when anesthesia APP notified of murmur history. This was previously discussed with Dr. Linna Caprice with plan for evaluation on the day of surgery. However, not that patient in on Plavix and ASA for LLE stent, Lorriane Shire at Dr. Ronnald Ramp'  office has been notified and is reaching out to Dr. Carlis Abbott to see if and when Plavix and ASA can been held for neurosurgery.   VS: BP (!) 159/47   Pulse 69   Temp 36.9 C (Oral)   Resp 18   Ht 5\' 9"  (1.753 m)   Wt 90.3 kg   SpO2 100%   BMI 29.40 kg/m    PROVIDERS: Mitchell, [name], MD is PCP [facility]) - Marcial Pacas, MD is neurologist. Last visit 04/29/18 for peripheral neuropathy and LLE pain. She is referring him to vascular surgery since ABIs indicated moderate BLE arterial disease. 03/2018 MRI L-spine showed L5-S1 large paramedian disc herniation compressing the left S1 nerve roots, so he was referred to neurosurgery.    LABS: Preoperative labs noted and overall unremarkable with the exception of his non-fasting glucose of 271. A1c 10.2 on 05/14/18. (all labs ordered are listed, but only abnormal results are displayed)  Labs Reviewed  GLUCOSE, CAPILLARY - Abnormal; Notable for the following components:      Result Value   Glucose-Capillary 244 (*)    All other components within normal limits  BASIC METABOLIC PANEL - Abnormal; Notable for the following components:   Glucose, Bld 271 (*)    All other components within normal limits  SURGICAL PCR SCREEN  CBC WITH DIFFERENTIAL/PLATELET  PROTIME-INR    IMAGES: CXR 06/20/18: IMPRESSION: No active cardiopulmonary disease.  MRI 04/12/18: IMPRESSION: This MRI of the lumbar  spine shows the following: 1. There appears to be a transitional S1 vertebral body. This is labeled L6/S1 2. At L4-L5, there is a small central disc herniation causing mild bilateral foraminal narrowing and mild lateral recess stenosis but no nerve root compression. 3. At L5-L6/S1, there is a large left paramedian disc herniation compressing the left S1 nerve root within the left lateral recess.  4. At L6/S1-S2, there is a small right disc protrusion causing mild right foraminal narrowing but no nerve root compression.   EKG:  05/14/18: SB at 53 bpm, first degree AV block.   CV: Aortogram with BLE arteriogram 06/18/18: Findings:  - Aortogram showed patent bilateral single renal arteries.   - His infrarenal aorta was small but patent.   - Bilateral common iliac arteries also small but patent.  He had greater than 50% stenosis of bilateral hypogastric arteries.  His left external iliac artery had a 60 to 70% stenosis in the midportion of the artery well above the femoral head. - Left lower extremity arteriogram revealed calcification in the distal common femoral with a focal stenosis of the proximal profunda and SFA of approximate 50% each. The SFA was patent throughout its course although heavily calcified and diseased and very small and measured less than 3 mm.  The above and below-knee popliteal artery as well as the trifurcation was patent.  Dominant runoff into the left lower extremity appeared to be through the posterior tibial artery even though the anterior tibial and peroneal was patent and sluggish. - Right lower extremity arteriogram showed a patent common femoral, profunda, SFA as well as above and below-knee popliteal artery patent trifurcation and three-vessel runoff.   - All of his vessels appeared relatively small throughout their course. Intervention: - Left external iliac artery stent placement with angioplasty (7 mm x 40 mm self-expanding Innova that was postdilated with a 6 mm x 40 mm Mustang)  ETT 05/11/11 [facility]):  Summary:  THR achieved. No symptoms. Baseline EKG showed NSR at 100 bpm with prominent Q waves. During exercise, rare PVC noted. No EKG changes suggestive of ischemia. Recovery uneventful.  BP response extremely exaggerated. Pending Dr. Marlou Porch.  - He had carotid U/S 09/18/13 (ordered by [facility]), but report summary cannot be viewed in Epic. Staff at [facility] was also unable to locate report results. However, 02/17/18 office note by Alroy Dust, [name], MD  indicates that patient has known right carotid bruit with "unremarkable carotid Doppler."   Past Medical History:  Diagnosis Date  . Arthritis   . BCC (basal cell carcinoma)   . BPH (benign prostatic hyperplasia)   . CAROTID BRUIT, RIGHT 11/30/2009  . DM 11/30/2009  . Heart murmur   . History of Holter monitoring 2009  . History of kidney stones   . HYPERCHOLESTEROLEMIA 11/30/2009  . HYPERTENSION 11/30/2009  . Kidney stones   . Peripheral vascular disease ([place])   . Right carotid bruit   . SMOKER 11/30/2009  . Stroke ([place]) 2012  . URINARY CALCULUS 11/30/2009    Past Surgical History:  Procedure Laterality Date  . ABDOMINAL AORTOGRAM W/LOWER EXTREMITY N/A 06/18/2018   Procedure: ABDOMINAL AORTOGRAM W/LOWER EXTREMITY;  Surgeon: Marty Heck, MD;  Location: [place] [facility];  Service: Cardiovascular;  Laterality: N/A;  . arthroscopy of right shoulder Right    Jan 2019, June 2019-- done at [place], Dr. Ninfa Linden  . CATARACT EXTRACTION W/ INTRAOCULAR LENS  IMPLANT, BILATERAL    . CYSTOSCOPY    .  FACIAL COSMETIC SURGERY  2014  . FINGER SURGERY  2002  . NECK SURGERY  1986  . PERIPHERAL VASCULAR INTERVENTION  06/18/2018   Procedure: PERIPHERAL VASCULAR INTERVENTION;  Surgeon: Marty Heck, MD;  Location: [place] [facility];  Service: Cardiovascular;;  Left external iliac  . ROTATOR CUFF REPAIR Left   . UMBILICAL HERNIA REPAIR      MEDICATIONS: . amLODipine-benazepril (LOTREL) 10-40 MG per capsule  . aspirin (BAYER ASPIRIN) 325 MG tablet  . clopidogrel (PLAVIX) 75 MG tablet  . gabapentin (NEURONTIN) 300 MG capsule  . hydrochlorothiazide (HYDRODIURIL) 25 MG tablet  . metFORMIN (GLUCOPHAGE) 500 MG tablet  . metoprolol succinate (TOPROL-XL) 50 MG 24 hr tablet  . pioglitazone (ACTOS) 45 MG tablet  . repaglinide (PRANDIN) 0.5 MG tablet  . rosuvastatin (CRESTOR) 40 MG tablet  . TRESIBA FLEXTOUCH 100 UNIT/ML SOPN FlexTouch Pen   No current  facility-administered medications for this encounter.     Myra Gianotti, PA-C Surgical Short Stay/Anesthesiology [facility] Phone [phone] [facility] Phone [phone] 06/23/2018 12:38 PM

## 2018-07-02 ENCOUNTER — Other Ambulatory Visit: Payer: Self-pay

## 2018-07-02 DIAGNOSIS — I739 Peripheral vascular disease, unspecified: Secondary | ICD-10-CM

## 2018-07-18 ENCOUNTER — Encounter (HOSPITAL_COMMUNITY): Payer: Medicare Other

## 2018-07-18 ENCOUNTER — Encounter: Payer: Self-pay | Admitting: Vascular Surgery

## 2018-07-22 ENCOUNTER — Encounter: Payer: Self-pay | Admitting: Vascular Surgery

## 2018-07-22 ENCOUNTER — Ambulatory Visit (INDEPENDENT_AMBULATORY_CARE_PROVIDER_SITE_OTHER)
Admission: RE | Admit: 2018-07-22 | Discharge: 2018-07-22 | Disposition: A | Discharge status: Home / Self Care | Payer: Medicare Other | Source: Ambulatory Visit | Attending: Vascular Surgery | Admitting: Vascular Surgery

## 2018-07-22 ENCOUNTER — Ambulatory Visit (INDEPENDENT_AMBULATORY_CARE_PROVIDER_SITE_OTHER): Payer: Medicare Other | Admitting: Vascular Surgery

## 2018-07-22 ENCOUNTER — Other Ambulatory Visit: Payer: Self-pay

## 2018-07-22 ENCOUNTER — Encounter: Payer: Medicare Other | Admitting: Vascular Surgery

## 2018-07-22 ENCOUNTER — Ambulatory Visit (HOSPITAL_COMMUNITY)
Admission: RE | Admit: 2018-07-22 | Discharge: 2018-07-22 | Disposition: A | Discharge status: Home / Self Care | Payer: Medicare Other | Source: Ambulatory Visit | Attending: Vascular Surgery | Admitting: Vascular Surgery

## 2018-07-22 VITALS — BP 150/66 | HR 58 | Temp 97.5°F | Resp 18 | Ht 69.0 in | Wt 196.8 lb

## 2018-07-22 DIAGNOSIS — I739 Peripheral vascular disease, unspecified: Secondary | ICD-10-CM

## 2018-07-22 NOTE — Progress Notes (Signed)
Patient name: Luis Lindsey MRN: [id number] DOB: 1950-09-12 Sex: male  REASON FOR VISIT: one month follow-up s/p left external iliac stent  HPI: Luis Lindsey is a 68 y.o. male with multiple medical problems that presents for interval follow-up after left external iliac stent placement with angioplasty for left lower extremity short distance claudication on 06/18/2018.  On follow-up today patient reports that he can now go to [place] and walk through the store without having to stop.  Previous to iliac intervention states he could not walk through the store without having to stop multiple times.  He is on Plavix after the iliac stent placement.  In addition he is having some pain that radiates down his left leg and is scheduled for a L4-L5 microdiscectomy with Dr. Ronnald Ramp on 08/01/2018.  Past Medical History:  Diagnosis Date  . Arthritis   . BCC (basal cell carcinoma)   . BPH (benign prostatic hyperplasia)   . CAROTID BRUIT, RIGHT 11/30/2009  . DM 11/30/2009  . Heart murmur   . History of Holter monitoring 2009  . History of kidney stones   . HYPERCHOLESTEROLEMIA 11/30/2009  . HYPERTENSION 11/30/2009  . Kidney stones   . Peripheral vascular disease ([place])   . Right carotid bruit   . SMOKER 11/30/2009  . Stroke ([place]) 2012  . URINARY CALCULUS 11/30/2009    Past Surgical History:  Procedure Laterality Date  . ABDOMINAL AORTOGRAM W/LOWER EXTREMITY N/A 06/18/2018   Procedure: ABDOMINAL AORTOGRAM W/LOWER EXTREMITY;  Surgeon: Marty Heck, MD;  Location: [facility];  Service: Cardiovascular;  Laterality: N/A;  . arthroscopy of right shoulder Right    Jan 2019, June 2019-- done at [place], Dr. Ninfa Linden  . CATARACT EXTRACTION W/ INTRAOCULAR LENS  IMPLANT, BILATERAL    . CYSTOSCOPY    . FACIAL COSMETIC SURGERY  2014  . FINGER SURGERY  2002  . NECK SURGERY  1986  . PERIPHERAL VASCULAR INTERVENTION  06/18/2018   Procedure: PERIPHERAL VASCULAR INTERVENTION;  Surgeon:  Marty Heck, MD;  Location: [facility];  Service: Cardiovascular;;  Left external iliac  . ROTATOR CUFF REPAIR Left   . UMBILICAL HERNIA REPAIR      Family History  Problem Relation Age of Onset  . Diabetes Sister   . Diabetes Mellitus I Sister   . Stroke Father   . Heart disease Father   . CVA Father   . Coronary artery disease Father   . Diabetes Mother   . Heart disease Mother   . Diabetes Mellitus I Mother   . Coronary artery disease Mother   . Coronary artery disease Brother   . Heart disease Brother   . Coronary artery disease Brother     SOCIAL HISTORY: Social History   Tobacco Use  . Smoking status: Former Smoker    Years: 40.00    Types: Cigarettes    Last attempt to quit: 12/2015    Years since quitting: 2.5  . Smokeless tobacco: Never Used  . Tobacco comment: Quit 12-2012  Substance Use Topics  . Alcohol use: No    Allergies  Allergen Reactions  . Codeine Nausea Only    Current Outpatient Medications  Medication Sig Dispense Refill  . amLODipine-benazepril (LOTREL) 10-40 MG per capsule Take 1 capsule by mouth daily.      [name] aspirin (BAYER ASPIRIN) 325 MG tablet Take 1 tablet (325 mg total) by mouth daily. 30 tablet 6  . clopidogrel (PLAVIX) 75 MG  tablet Take 1 tablet (75 mg total) by mouth daily. 30 tablet 11  . gabapentin (NEURONTIN) 300 MG capsule TAKE 1 CAPSULE BY MOUTH THREE TIMES A DAY (Patient taking differently: Take 300 mg by mouth 3 (three) times daily. ) 270 capsule 3  . hydrochlorothiazide (HYDRODIURIL) 25 MG tablet Take 25 mg by mouth daily.      . metFORMIN (GLUCOPHAGE) 500 MG tablet 2 tablets by mouth twice daily (Patient taking differently: Take 1,000 mg by mouth 2 (two) times daily with a meal. ) 360 tablet 3  . metoprolol succinate (TOPROL-XL) 50 MG 24 hr tablet Take 50 mg by mouth daily. Take with or immediately following a meal.    . pantoprazole (PROTONIX) 40 MG tablet Take 40 mg by mouth daily as needed.    .  pioglitazone (ACTOS) 45 MG tablet TAKE 1 TABLET DAILY (NEED APPOINTMENT FOR FURTHER REFILLS) (Patient taking differently: Take 45 mg by mouth daily. ) 90 tablet 0  . repaglinide (PRANDIN) 0.5 MG tablet Take 1 mg by mouth 3 (three) times daily before meals.   12  . rosuvastatin (CRESTOR) 40 MG tablet Take 40 mg by mouth daily.     . TRESIBA FLEXTOUCH 100 UNIT/ML SOPN FlexTouch Pen Inject 60 Units into the skin daily.   12   No current facility-administered medications for this visit.     REVIEW OF SYSTEMS:  [X]  denotes positive finding, [ ]  denotes negative finding Cardiac  Comments:  Chest pain or chest pressure:    Shortness of breath upon exertion:    Short of breath when lying flat:    Irregular heart rhythm:        Vascular    Pain in calf, thigh, or hip brought on by ambulation:    Pain in feet at night that wakes you up from your sleep:     Blood clot in your veins:    Leg swelling:         Pulmonary    Oxygen at home:    Productive cough:     Wheezing:         Neurologic    Sudden weakness in arms or legs:     Sudden numbness in arms or legs:     Sudden onset of difficulty speaking or slurred speech:    Temporary loss of vision in one eye:     Problems with dizziness:         Gastrointestinal    Blood in stool:     Vomited blood:         Genitourinary    Burning when urinating:     Blood in urine:        Psychiatric    Major depression:         Hematologic    Bleeding problems:    Problems with blood clotting too easily:        Skin    Rashes or ulcers:        Constitutional    Fever or chills:      PHYSICAL EXAM: Vitals:   07/22/18 1034  BP: (!) 150/66  Pulse: (!) 58  Resp: 18  Temp: (!) 97.5 F (36.4 C)  SpO2: 100%  Weight: 196 lb 12.2 oz (89.3 kg)  Height: 5\' 9"  (1.753 m)    GENERAL: The patient is a well-nourished male, in no acute distress. The vital signs are documented above. CARDIAC: There is a regular rate and rhythm.  VASCULAR:    Palpable  bilateral femoral pulses No palpable pedal pulses Biphasic right foot DP/PT signals Monophasic left DP/PT signals PULMONARY: There is good air exchange bilaterally without wheezing or rales. ABDOMEN: Soft and non-tender with normal pitched bowel sounds.  MUSCULOSKELETAL: There are no major deformities or cyanosis.   DATA:   ABIs are improved from 0.59 to .69 on the left with a monophasic waveform and 0.7 on the right with a biphasic waveform.  Iliac duplex shows an elevated velocity of 249 proximal to the left external iliac stent.  However stent was not well visualized.  Assessment/Plan:  Overall Mr. Wiler seems to be doing somewhat better since left external iliac intervention for left lower extremity claudication.  I discussed with him previously that he does have some common femoral disease as well as a fair amount of SFA disease on the left and if the iliac stent did not improve his symptoms we could reevaluate for further intervention including possible endarterectomy and/or SFA intervention.  He is now scheduled for a L4-L5 microdiscectomy with Dr. Ronnald Ramp next week and I think given that he has noticed improvement in his walking at [facility] I would favor no more intervention at this time until his back surgery is done.  I will plan to see him back in 3 months and we can monitor his ongoing progress and make a decision then if he needs anything further down the road.   Marty Heck, MD [facility] Office: [phone] Pager: [phone]

## 2018-07-24 NOTE — Pre-Procedure Instructions (Signed)
Luis Lindsey  07/24/2018      [id number] [name], [place] [address] Phone: [phone] Fax: [phone]    Your procedure is scheduled on March 6  Report to [facility] at 5:30 A.M.  Call this number if you have problems the morning of surgery:  [phone]   Remember:  Do not eat or drink after midnight.      Take these medicines the morning of surgery with A SIP OF WATER :              Gabapentin (neurontin)             Metoprolol (toprol-xl)             Pantoprazole (protonix)                     7 days prior to surgery STOP taking Aleve, Naproxen, Ibuprofen, Motrin, Advil, Goody's, BC's, all herbal medications, fish oil, and all vitamins.               Follow your surgeon's instructions on when to stop Asprin and plavix.  If no instructions were given by your surgeon then you will need to call the office to get those instructions.                       How to Manage Your Diabetes Before and After Surgery  Why is it important to control my blood sugar before and after surgery? . Improving blood sugar levels before and after surgery helps healing and can limit problems. . A way of improving blood sugar control is eating a healthy diet by: o  Eating less sugar and carbohydrates o  Increasing activity/exercise o  Talking with your doctor about reaching your blood sugar goals . High blood sugars (greater than 180 mg/dL) can raise your risk of infections and slow your recovery, so you will need to focus on controlling your diabetes during the weeks before surgery. . Make sure that the doctor who takes care of your diabetes knows about your planned surgery including the date and location.  How do I manage my blood sugar before surgery? . Check your blood sugar at least 4 times a day, starting 2 days before surgery, to make sure that the level is  not too high or low. o Check your blood sugar the morning of your surgery when you wake up and every 2 hours until you get to the Short Stay unit. . If your blood sugar is less than 70 mg/dL, you will need to treat for low blood sugar: o Do not take insulin. o Treat a low blood sugar (less than 70 mg/dL) with  cup of clear juice (cranberry or apple), 4 glucose tablets, OR glucose gel. Recheck blood sugar in 15 minutes after treatment (to make sure it is greater than 70 mg/dL). If your blood sugar is not greater than 70 mg/dL on recheck, call [phone] o  for further instructions. . Report your blood sugar to the short stay nurse when you get to Short Stay.  . If you are admitted to the hospital after surgery: o Your blood sugar will be checked by the staff and you will probably be given insulin after surgery (instead of oral diabetes medicines) to make sure you have good blood sugar levels. o The goal for blood  sugar control after surgery is 80-180 mg/dL.         WHAT DO I DO ABOUT MY DIABETES MEDICATION?   [name] Do not take oral diabetes medicines (pills) the morning of surgery. ( actos,metformin, prandin)        . THE MORNING OF SURGERY, take ______30_______ units of _____tresiba_____insulin.     Do not wear jewelry.  Do not wear lotions, powders, or perfumes, or deodorant.  Do not shave 48 hours prior to surgery.  Men may shave face and neck.  Do not bring valuables to the hospital.  [facility] is not responsible for any belongings or valuables.  Contacts, dentures or bridgework may not be worn into surgery.  Leave your suitcase in the car.  After surgery it may be brought to your room.  For patients admitted to the hospital, discharge time will be determined by your treatment team.  Patients discharged the day of surgery will not be allowed to drive home.    Special instructions:  [place]- Preparing For Surgery  Before surgery, you can play an important role.  Because skin is not sterile, your skin needs to be as free of germs as possible. You can reduce the number of germs on your skin by washing with CHG (chlorahexidine gluconate) Soap before surgery.  CHG is an antiseptic cleaner which kills germs and bonds with the skin to continue killing germs even after washing.    Oral Hygiene is also important to reduce your risk of infection.  Remember - BRUSH YOUR TEETH THE MORNING OF SURGERY WITH YOUR REGULAR TOOTHPASTE  Please do not use if you have an allergy to CHG or antibacterial soaps. If your skin becomes reddened/irritated stop using the CHG.  Do not shave (including legs and underarms) for at least 48 hours prior to first CHG shower. It is OK to shave your face.  Please follow these instructions carefully.   1. Shower the NIGHT BEFORE SURGERY and the MORNING OF SURGERY with CHG.   2. If you chose to wash your hair, wash your hair first as usual with your normal shampoo.  3. After you shampoo, rinse your hair and body thoroughly to remove the shampoo.  4. Use CHG as you would any other liquid soap. You can apply CHG directly to the skin and wash gently with a scrungie or a clean washcloth.   5. Apply the CHG Soap to your body ONLY FROM THE NECK DOWN.  Do not use on open wounds or open sores. Avoid contact with your eyes, ears, mouth and genitals (private parts). Wash Face and genitals (private parts)  with your normal soap.  6. Wash thoroughly, paying special attention to the area where your surgery will be performed.  7. Thoroughly rinse your body with warm water from the neck down.  8. DO NOT shower/wash with your normal soap after using and rinsing off the CHG Soap.  9. Pat yourself dry with a CLEAN TOWEL.  10. Wear CLEAN PAJAMAS to bed the night before surgery, wear comfortable clothes the morning of surgery  11. Place CLEAN SHEETS on your bed the night of your first shower and DO NOT SLEEP WITH PETS.    Day of Surgery:  Do not  apply any deodorants/lotions.  Please wear clean clothes to the hospital/surgery center.   Remember to brush your teeth WITH YOUR REGULAR TOOTHPASTE.    Please read over the following fact sheets that you were given. Coughing and Deep Breathing, MRSA Information  and Surgical Site Infection Prevention

## 2018-07-25 ENCOUNTER — Other Ambulatory Visit: Payer: Self-pay

## 2018-07-25 ENCOUNTER — Encounter (HOSPITAL_COMMUNITY)
Admission: RE | Admit: 2018-07-25 | Discharge: 2018-07-25 | Disposition: A | Discharge status: Home / Self Care | Payer: Medicare Other | Source: Ambulatory Visit | Attending: Neurological Surgery | Admitting: Neurological Surgery

## 2018-07-25 ENCOUNTER — Encounter (HOSPITAL_COMMUNITY): Payer: Self-pay

## 2018-07-25 DIAGNOSIS — Z01812 Encounter for preprocedural laboratory examination: Secondary | ICD-10-CM | POA: Insufficient documentation

## 2018-07-25 HISTORY — DX: Personal history of other diseases of the digestive system: Z87.19

## 2018-07-25 HISTORY — DX: Gastro-esophageal reflux disease without esophagitis: K21.9

## 2018-07-25 LAB — BASIC METABOLIC PANEL
Anion gap: 9 (ref 5–15)
BUN: 17 mg/dL (ref 8–23)
CO2: 25 mmol/L (ref 22–32)
Calcium: 9.3 mg/dL (ref 8.9–10.3)
Chloride: 102 mmol/L (ref 98–111)
Creatinine, Ser: 0.86 mg/dL (ref 0.61–1.24)
GFR calc Af Amer: >60 mL/min (ref >60)
GFR calc non Af Amer: >60 mL/min (ref >60)
Glucose, Bld: 216 mg/dL — ABNORMAL HIGH (ref 70–99)
Potassium: 3.8 mmol/L (ref 3.5–5.1)
SODIUM: 136 mmol/L (ref 135–145)

## 2018-07-25 LAB — CBC
HCT: 41.2 % (ref 39.0–52.0)
Hemoglobin: 13.1 g/dL (ref 13.0–17.0)
MCH: 28.1 pg (ref 26.0–34.0)
MCHC: 31.8 g/dL (ref 30.0–36.0)
MCV: 88.2 fL (ref 80.0–100.0)
Platelets: 333 10*3/uL (ref 150–400)
RBC: 4.67 MIL/uL (ref 4.22–5.81)
RDW: 15.3 % (ref 11.5–15.5)
WBC: 6.5 10*3/uL (ref 4.0–10.5)
nRBC: 0 % (ref 0.0–0.2)

## 2018-07-25 LAB — HEMOGLOBIN A1C
Hgb A1c MFr Bld: 8.9 % — ABNORMAL HIGH (ref 4.8–5.6)
Mean Plasma Glucose: 208.73 mg/dL

## 2018-07-25 LAB — SURGICAL PCR SCREEN
MRSA, PCR: NEGATIVE
STAPHYLOCOCCUS AUREUS: NEGATIVE

## 2018-07-25 LAB — GLUCOSE, CAPILLARY: Glucose-Capillary: 205 mg/dL — ABNORMAL HIGH (ref 70–99)

## 2018-07-25 NOTE — Progress Notes (Signed)
PCP - Donnie Coffin @ [facility] - saw on yrs. Ago-doesn't remember who it was  Chest x-ray - 06-20-18 EKG - 05/14/18 Stress Test - 2012 required for work-normal ECHO - na Cardiac Cath - 06/18/10 peripheral cath   Sleep Study - na CPAP -   Fasting Blood Sugar - 150 Checks Blood Sugar : every other day, machine broken waiting to get another one  Blood Thinner Instructions: stop 1 week prior to surgery Aspirin Instructions:  Stop 1 week prior to surgery  Anesthesia review: ekg-surgery been cancelled 2x since December 2019  Patient denies shortness of breath, fever, cough and chest pain at PAT appointment   Patient verbalized understanding of instructions that were given to them at the PAT appointment. Patient was also instructed that they will need to review over the PAT instructions again at home before surgery.

## 2018-07-28 NOTE — Progress Notes (Signed)
Anesthesia Chart Review:  Case:  [id number] Date/Time:  08/01/18 0715   Procedure:  Microdiscectomy - L4-L5 - left (Left Back)   Anesthesia type:  General   Pre-op diagnosis:  HNP   Location:  [facility] OR ROOM 35 / [place] [facility]   Surgeon:  Eustace Moore, MD      DISCUSSION:  Patient is a 68 year old male scheduled for the above procedure. Surgery was initially scheduled for 05/15/18 and reviewed with anesthesiologist Roberts Gaudy, MD at that time. For unclear reasons, surgery was postponed and rescheduled for 06/26/18, but again was postponed due to being on ASA and Plavix following left EIA stent 06/18/18 by vascular surgeon Monica Martinez, MD. Since that patient patient was seen by Dr. Carlis Abbott who is aware of surgery plans. Patient's A1c, while still elevated, has improved from 10.2 on 05/14/18 to 8.9 with reported fasting CBGs ~ 150.     History includes former smoker (quit '14), HTN, DM2, PAD (s/p left EIA stent 06/18/18), CVA '12, right carotid bruit '11 ("unremarkable carotid Doppler" by [facility] records), murmur ("Grade 2/6 SEM" per 02/17/18 exam byMitchell, [name], MD), facial cosmetic surgery (not specified) '14.   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 PCP classified murmur as 2/6 SEM. He denied SOB, cough, fever, and chest pain at PAT.  If patient has had an echo, report is not readily seen at [facility] or in [facility]. Patient was not still at his PAT visit when anesthesia APP notified of murmur history. This was previously discussed with Dr. Linna Caprice with plan for evaluation on the day of surgery.  As above, patient recently re-evaluated by vascular surgeon who is aware of neurosurgery plans. Patient reported Plavix and ASA to be held a week before surgery. Lorriane Shire at Dr. Ronnald Ramp' office to clarify with Dr. Carlis Abbott and update  patient if needed. Anesthesiologist to evaluate on the day of surgery.     VS: BP (!) 145/50   Pulse (!) 59   Temp 36.7 C (Oral)   Resp 18   Ht 5\' 9"  (1.753 m)   Wt 90.8 kg   SpO2 100%   BMI 29.55 kg/m    PROVIDERS: Mitchell, [name], MDis PCP [facility]) - Marcial Pacas, MD is neurologist. Last visit 04/29/18 for peripheral neuropathy and LLE pain. She is referring him to vascular surgery since ABIs indicated moderate BLE arterial disease. 03/2018 MRI L-spine showed L5-S1 large paramedian disc herniation compressing the left S1 nerve roots, so he was referred to neurosurgery. Monica Martinez, MD is vascular surgeon. Last visit 07/22/18. Given patient had noticed improvement in his walking at [facility] following left EIA stent, Dr. Carlis Abbott favored no further intervention until after back surgery. Three month follow-up planned and would consider possible endarterectomy and/or SFA intervention if warranted.  - He is not followed by cardiology, but had an ETT by Candee Furbish, MD in 2012.    LABS: Preoperative labs noted. A1c 8.9, but down from 10.2 a few months ago. (A1c result called to Lorriane Shire at Dr. Ronnald Ramp' office.)  (all labs ordered are listed, but only abnormal results are displayed)  Labs Reviewed  GLUCOSE, CAPILLARY - Abnormal; Notable for the following components:      Result Value   Glucose-Capillary 205 (*)    All other components within normal limits  BASIC  METABOLIC PANEL - Abnormal; Notable for the following components:   Glucose, Bld 216 (*)    All other components within normal limits  HEMOGLOBIN A1C - Abnormal; Notable for the following components:   Hgb A1c MFr Bld 8.9 (*)    All other components within normal limits  SURGICAL PCR SCREEN  CBC    IMAGES: CXR 06/20/18: IMPRESSION: No active cardiopulmonary disease.  MRI L-spine 04/12/18: IMPRESSION:  1. There appears to be a transitional S1 vertebral body. This is labeled L6/S1 2. At L4-L5, there  is a small central disc herniation causing mild bilateral foraminal narrowing and mild lateral recess stenosis but no nerve root compression. 3. At L5-L6/S1, there is a large left paramedian disc herniation compressing the left S1 nerve root within the left lateral recess.  4. At L6/S1-S2, there is a small right disc protrusion causing mild right foraminal narrowing but no nerve root compression.   EKG:05/14/18: SB at 53 bpm, first degree AV block.   CV: Aortogram with BLE arteriogram 06/18/18: Findings: - Aortogram showed patent bilateral single renal arteries.  - His infrarenal aorta was small but patent.  - Bilateral common iliac arteries also small but patent. He had greater than 50% stenosis of bilateral hypogastric arteries. His left external iliac artery had a 60 to 70% stenosis in the midportion of the artery well above the femoral head. - Left lower extremity arteriogram revealed calcification in the distal common femoral with a focal stenosis of the proximal profunda and SFA of approximate 50% each. The SFA was patent throughout its course although heavily calcified and diseased and very small and measured less than 3 mm. The above and below-knee popliteal artery as well as the trifurcation was patent. Dominant runoff into the left lower extremity appeared to be through the posterior tibial artery even though the anterior tibial andperoneal was patent and sluggish. - Right lower extremity arteriogram showed a patent common femoral, profunda, SFA as well as above and below-knee popliteal artery patent trifurcation and three-vessel runoff. - All of his vessels appeared relatively small throughout their course. Intervention: - Left external iliac artery stent placement with angioplasty (7 mm x 40 mm self-expanding Innova that was postdilated with a 6 mm x 40 mm Mustang)  ETT 05/11/11 [facility]):  Summary:  THR achieved. No symptoms. Baseline EKG showed NSR  at 100 bpm with prominent Q waves. During exercise, rare PVC noted. No EKG changes suggestive of ischemia. Recovery uneventful. BP response extremely exaggerated. Pending Dr. Marlou Porch.  - He had carotid U/S 09/18/13 (ordered by [facility]), but report summary cannot be viewed in Epic. Staff at [facility] was also unable to locate report results. However, 02/17/18 office note byMitchell, [name], MDindicates that patient has known right carotid bruit with "unremarkable carotid Doppler."   Past Medical History:  Diagnosis Date  . Arthritis   . BCC (basal cell carcinoma)   . BPH (benign prostatic hyperplasia)   . CAROTID BRUIT, RIGHT 11/30/2009  . DM 11/30/2009  . GERD (gastroesophageal reflux disease)   . Heart murmur   . History of hiatal hernia   . History of Holter monitoring 2009  . History of kidney stones   . HYPERCHOLESTEROLEMIA 11/30/2009  . HYPERTENSION 11/30/2009  . Kidney stones   . Peripheral vascular disease ([place])   . Right carotid bruit   . SMOKER 11/30/2009  . Stroke ([place]) 2012  . URINARY CALCULUS 11/30/2009    Past Surgical History:  Procedure Laterality Date  . ABDOMINAL AORTOGRAM  W/LOWER EXTREMITY N/A 06/18/2018   Procedure: ABDOMINAL AORTOGRAM W/LOWER EXTREMITY;  Surgeon: Marty Heck, MD;  Location: [place] [facility];  Service: Cardiovascular;  Laterality: N/A;  . arthroscopy of right shoulder Right    Jan 2019, June 2019-- done at [place], Dr. Ninfa Linden  . CATARACT EXTRACTION W/ INTRAOCULAR LENS  IMPLANT, BILATERAL    . CYSTOSCOPY    . FACIAL COSMETIC SURGERY  2014  . FINGER SURGERY  2002  . NECK SURGERY  1986  . PERIPHERAL VASCULAR INTERVENTION  06/18/2018   Procedure: PERIPHERAL VASCULAR INTERVENTION;  Surgeon: Marty Heck, MD;  Location: [place] [facility];  Service: Cardiovascular;;  Left external iliac  . ROTATOR CUFF REPAIR Left   . UMBILICAL HERNIA REPAIR      MEDICATIONS: . amLODipine-benazepril (LOTREL) 10-40  MG per capsule  . aspirin (BAYER ASPIRIN) 325 MG tablet  . clopidogrel (PLAVIX) 75 MG tablet  . gabapentin (NEURONTIN) 300 MG capsule  . hydrochlorothiazide (HYDRODIURIL) 25 MG tablet  . metFORMIN (GLUCOPHAGE) 500 MG tablet  . metoprolol succinate (TOPROL-XL) 50 MG 24 hr tablet  . pantoprazole (PROTONIX) 40 MG tablet  . pioglitazone (ACTOS) 45 MG tablet  . repaglinide (PRANDIN) 0.5 MG tablet  . rosuvastatin (CRESTOR) 40 MG tablet  . TRESIBA FLEXTOUCH 100 UNIT/ML SOPN FlexTouch Pen   No current facility-administered medications for this encounter.     Myra Gianotti, PA-C Surgical Short Stay/Anesthesiology [facility] Phone [phone] [facility] Phone [phone] 07/28/2018 1:30 PM

## 2018-07-28 NOTE — Anesthesia Preprocedure Evaluation (Addendum)
Anesthesia Evaluation  Patient identified by MRN, date of birth, ID band Patient awake    Reviewed: Allergy & Precautions, NPO status , Patient's Chart, lab work & pertinent test results, reviewed documented beta blocker date and time   Airway Mallampati: II  TM Distance: >3 FB Neck ROM: Full    Dental  (+) Dental Advisory Given, Teeth Intact, Chipped   Pulmonary neg pulmonary ROS, former smoker,    Pulmonary exam normal breath sounds clear to auscultation       Cardiovascular hypertension, Pt. on home beta blockers and Pt. on medications + Peripheral Vascular Disease  Normal cardiovascular exam+ Valvular Problems/Murmurs  Rhythm:Regular Rate:Normal     Neuro/Psych TIA Neuromuscular disease CVA negative psych ROS   GI/Hepatic Neg liver ROS, hiatal hernia, GERD  ,  Endo/Other  diabetes  Renal/GU Renal disease     Musculoskeletal  (+) Arthritis ,   Abdominal   Peds  Hematology negative hematology ROS (+)   Anesthesia Other Findings   Reproductive/Obstetrics negative OB ROS                           Anesthesia Physical Anesthesia Plan  ASA: III  Anesthesia Plan: General   Post-op Pain Management:    Induction: Intravenous  PONV Risk Score and Plan: 3 and Ondansetron, Midazolam, Treatment may vary due to age or medical condition and Dexamethasone  Airway Management Planned: Oral ETT  Additional Equipment: None  Intra-op Plan:   Post-operative Plan: Extubation in OR  Informed Consent: I have reviewed the patients History and Physical, chart, labs and discussed the procedure including the risks, benefits and alternatives for the proposed anesthesia with the patient or authorized representative who has indicated his/her understanding and acceptance.     Dental advisory given  Plan Discussed with: CRNA  Anesthesia Plan Comments: (PAT note written 07/28/2018 by Myra Gianotti,  PA-C. )       Anesthesia Quick Evaluation

## 2018-08-01 ENCOUNTER — Encounter (HOSPITAL_COMMUNITY)
Admission: RE | Disposition: A | Discharge status: Home / Self Care | Payer: Self-pay | Source: Home / Self Care | Attending: Neurological Surgery

## 2018-08-01 ENCOUNTER — Other Ambulatory Visit: Payer: Self-pay

## 2018-08-01 ENCOUNTER — Observation Stay (HOSPITAL_COMMUNITY)
Admission: RE | Admit: 2018-08-01 | Discharge: 2018-08-01 | Disposition: A | Discharge status: Home / Self Care | Payer: Medicare Other | Attending: Neurological Surgery | Admitting: Neurological Surgery

## 2018-08-01 ENCOUNTER — Encounter (HOSPITAL_COMMUNITY): Payer: Self-pay | Admitting: Neurological Surgery

## 2018-08-01 ENCOUNTER — Ambulatory Visit (HOSPITAL_COMMUNITY): Payer: Medicare Other | Admitting: Anesthesiology

## 2018-08-01 ENCOUNTER — Ambulatory Visit (HOSPITAL_COMMUNITY): Payer: Medicare Other | Admitting: Physician Assistant

## 2018-08-01 DIAGNOSIS — Z87442 Personal history of urinary calculi: Secondary | ICD-10-CM | POA: Insufficient documentation

## 2018-08-01 DIAGNOSIS — Z8673 Personal history of transient ischemic attack (TIA), and cerebral infarction without residual deficits: Secondary | ICD-10-CM | POA: Diagnosis not present

## 2018-08-01 DIAGNOSIS — Z7982 Long term (current) use of aspirin: Secondary | ICD-10-CM | POA: Diagnosis not present

## 2018-08-01 DIAGNOSIS — E785 Hyperlipidemia, unspecified: Secondary | ICD-10-CM | POA: Insufficient documentation

## 2018-08-01 DIAGNOSIS — R011 Cardiac murmur, unspecified: Secondary | ICD-10-CM | POA: Diagnosis not present

## 2018-08-01 DIAGNOSIS — E1151 Type 2 diabetes mellitus with diabetic peripheral angiopathy without gangrene: Secondary | ICD-10-CM | POA: Insufficient documentation

## 2018-08-01 DIAGNOSIS — Z79899 Other long term (current) drug therapy: Secondary | ICD-10-CM | POA: Insufficient documentation

## 2018-08-01 DIAGNOSIS — N400 Enlarged prostate without lower urinary tract symptoms: Secondary | ICD-10-CM | POA: Diagnosis not present

## 2018-08-01 DIAGNOSIS — E7800 Pure hypercholesterolemia, unspecified: Secondary | ICD-10-CM | POA: Insufficient documentation

## 2018-08-01 DIAGNOSIS — M1990 Unspecified osteoarthritis, unspecified site: Secondary | ICD-10-CM | POA: Insufficient documentation

## 2018-08-01 DIAGNOSIS — M5116 Intervertebral disc disorders with radiculopathy, lumbar region: Principal | ICD-10-CM | POA: Insufficient documentation

## 2018-08-01 DIAGNOSIS — Z9842 Cataract extraction status, left eye: Secondary | ICD-10-CM | POA: Insufficient documentation

## 2018-08-01 DIAGNOSIS — Z961 Presence of intraocular lens: Secondary | ICD-10-CM | POA: Insufficient documentation

## 2018-08-01 DIAGNOSIS — Z885 Allergy status to narcotic agent status: Secondary | ICD-10-CM | POA: Insufficient documentation

## 2018-08-01 DIAGNOSIS — Z85828 Personal history of other malignant neoplasm of skin: Secondary | ICD-10-CM | POA: Insufficient documentation

## 2018-08-01 DIAGNOSIS — Z7984 Long term (current) use of oral hypoglycemic drugs: Secondary | ICD-10-CM | POA: Diagnosis not present

## 2018-08-01 DIAGNOSIS — Z87891 Personal history of nicotine dependence: Secondary | ICD-10-CM | POA: Insufficient documentation

## 2018-08-01 DIAGNOSIS — I10 Essential (primary) hypertension: Secondary | ICD-10-CM | POA: Diagnosis not present

## 2018-08-01 DIAGNOSIS — K219 Gastro-esophageal reflux disease without esophagitis: Secondary | ICD-10-CM | POA: Insufficient documentation

## 2018-08-01 DIAGNOSIS — K449 Diaphragmatic hernia without obstruction or gangrene: Secondary | ICD-10-CM | POA: Diagnosis not present

## 2018-08-01 DIAGNOSIS — Z9841 Cataract extraction status, right eye: Secondary | ICD-10-CM | POA: Insufficient documentation

## 2018-08-01 DIAGNOSIS — Z823 Family history of stroke: Secondary | ICD-10-CM | POA: Insufficient documentation

## 2018-08-01 DIAGNOSIS — Z833 Family history of diabetes mellitus: Secondary | ICD-10-CM | POA: Insufficient documentation

## 2018-08-01 DIAGNOSIS — Z9889 Other specified postprocedural states: Secondary | ICD-10-CM

## 2018-08-01 DIAGNOSIS — Z419 Encounter for procedure for purposes other than remedying health state, unspecified: Secondary | ICD-10-CM

## 2018-08-01 HISTORY — PX: LUMBAR LAMINECTOMY/DECOMPRESSION MICRODISCECTOMY: SHX5026

## 2018-08-01 LAB — GLUCOSE, CAPILLARY
Glucose-Capillary: 110 mg/dL — ABNORMAL HIGH (ref 70–99)
Glucose-Capillary: 84 mg/dL (ref 70–99)
Glucose-Capillary: 133 mg/dL — ABNORMAL HIGH (ref 70–99)

## 2018-08-01 SURGERY — LUMBAR LAMINECTOMY/DECOMPRESSION MICRODISCECTOMY 1 LEVEL
Anesthesia: General | Site: Back | Laterality: Left

## 2018-08-01 MED ORDER — HYDROMORPHONE HCL 1 MG/ML IJ SOLN
INTRAMUSCULAR | Status: AC
  Filled 2018-08-01: qty 1

## 2018-08-01 MED ORDER — BUPIVACAINE HCL (PF) 0.25 % IJ SOLN
INTRAMUSCULAR | Status: DC | PRN
  Administered 2018-08-01: 12 mL

## 2018-08-01 MED ORDER — DEXAMETHASONE SODIUM PHOSPHATE 10 MG/ML IJ SOLN: 10.0000 mg | INTRAMUSCULAR | Status: DC

## 2018-08-01 MED ORDER — METHOCARBAMOL 500 MG PO TABS
500.0000 mg | ORAL_TABLET | Freq: Four times a day (QID) | ORAL | Status: DC | PRN
  Administered 2018-08-01: 500 mg via ORAL

## 2018-08-01 MED ORDER — ONDANSETRON HCL 4 MG/2ML IJ SOLN: 4.0000 mg | Freq: Four times a day (QID) | INTRAMUSCULAR | Status: DC | PRN

## 2018-08-01 MED ORDER — ACETAMINOPHEN 650 MG RE SUPP: 650.0000 mg | RECTAL | Status: DC | PRN

## 2018-08-01 MED ORDER — METHOCARBAMOL 500 MG PO TABS
ORAL_TABLET | ORAL | Status: AC
  Filled 2018-08-01: qty 1

## 2018-08-01 MED ORDER — CEFAZOLIN SODIUM-DEXTROSE 2-4 GM/100ML-% IV SOLN
2.0000 g | Freq: Three times a day (TID) | INTRAVENOUS | Status: DC
  Administered 2018-08-01: 2 g via INTRAVENOUS
  Filled 2018-08-01: qty 100

## 2018-08-01 MED ORDER — POTASSIUM CHLORIDE IN NACL 20-0.9 MEQ/L-% IV SOLN: INTRAVENOUS | Status: DC

## 2018-08-01 MED ORDER — HYDROMORPHONE HCL 1 MG/ML IJ SOLN
0.2500 mg | INTRAMUSCULAR | Status: DC | PRN
  Administered 2018-08-01 (×4): 0.5 mg via INTRAVENOUS

## 2018-08-01 MED ORDER — METOPROLOL SUCCINATE ER 25 MG PO TB24
50.0000 mg | ORAL_TABLET | Freq: Once | ORAL | Status: AC
  Administered 2018-08-01: 50 mg via ORAL

## 2018-08-01 MED ORDER — DEXAMETHASONE SODIUM PHOSPHATE 10 MG/ML IJ SOLN
INTRAMUSCULAR | Status: AC
  Filled 2018-08-01: qty 1

## 2018-08-01 MED ORDER — MIDAZOLAM HCL 2 MG/2ML IJ SOLN
INTRAMUSCULAR | Status: AC
  Filled 2018-08-01: qty 2

## 2018-08-01 MED ORDER — AMLODIPINE BESYLATE 5 MG PO TABS: 10.0000 mg | ORAL_TABLET | Freq: Every day | ORAL | Status: DC

## 2018-08-01 MED ORDER — PIOGLITAZONE HCL 45 MG PO TABS
45.0000 mg | ORAL_TABLET | Freq: Every day | ORAL | Status: DC
  Filled 2018-08-01: qty 1

## 2018-08-01 MED ORDER — METHOCARBAMOL 1000 MG/10ML IJ SOLN
500.0000 mg | Freq: Four times a day (QID) | INTRAVENOUS | Status: DC | PRN
  Filled 2018-08-01: qty 5

## 2018-08-01 MED ORDER — DEXAMETHASONE SODIUM PHOSPHATE 10 MG/ML IJ SOLN
INTRAMUSCULAR | Status: DC | PRN
  Administered 2018-08-01: 10 mg via INTRAVENOUS

## 2018-08-01 MED ORDER — SODIUM CHLORIDE 0.9% FLUSH: 3.0000 mL | INTRAVENOUS | Status: DC | PRN

## 2018-08-01 MED ORDER — BENAZEPRIL HCL 40 MG PO TABS
40.0000 mg | ORAL_TABLET | Freq: Every day | ORAL | Status: DC
  Filled 2018-08-01: qty 1

## 2018-08-01 MED ORDER — SENNA 8.6 MG PO TABS: 1.0000 | ORAL_TABLET | Freq: Two times a day (BID) | ORAL | Status: DC

## 2018-08-01 MED ORDER — INSULIN ASPART 100 UNIT/ML ~~LOC~~ SOLN: 0.0000 [IU] | Freq: Three times a day (TID) | SUBCUTANEOUS | Status: DC

## 2018-08-01 MED ORDER — CEFAZOLIN SODIUM-DEXTROSE 2-4 GM/100ML-% IV SOLN
INTRAVENOUS | Status: AC
  Filled 2018-08-01: qty 100

## 2018-08-01 MED ORDER — ROCURONIUM BROMIDE 50 MG/5ML IV SOSY
PREFILLED_SYRINGE | INTRAVENOUS | Status: AC
  Filled 2018-08-01: qty 5

## 2018-08-01 MED ORDER — ONDANSETRON HCL 4 MG/2ML IJ SOLN
INTRAMUSCULAR | Status: DC | PRN
  Administered 2018-08-01: 4 mg via INTRAVENOUS

## 2018-08-01 MED ORDER — OXYCODONE HCL 5 MG PO TABS: 5.0000 mg | ORAL_TABLET | ORAL | 0 refills | Status: DC | PRN

## 2018-08-01 MED ORDER — OXYCODONE HCL 5 MG PO TABS
ORAL_TABLET | ORAL | Status: AC
  Filled 2018-08-01: qty 1

## 2018-08-01 MED ORDER — THROMBIN 5000 UNITS EX SOLR
CUTANEOUS | Status: AC
  Filled 2018-08-01: qty 5000

## 2018-08-01 MED ORDER — FENTANYL CITRATE (PF) 250 MCG/5ML IJ SOLN
INTRAMUSCULAR | Status: DC | PRN
  Administered 2018-08-01: 100 ug via INTRAVENOUS

## 2018-08-01 MED ORDER — LIDOCAINE 2% (20 MG/ML) 5 ML SYRINGE
INTRAMUSCULAR | Status: AC
  Filled 2018-08-01: qty 5

## 2018-08-01 MED ORDER — CELECOXIB 200 MG PO CAPS: 200.0000 mg | ORAL_CAPSULE | Freq: Two times a day (BID) | ORAL | Status: DC

## 2018-08-01 MED ORDER — LIDOCAINE 2% (20 MG/ML) 5 ML SYRINGE
INTRAMUSCULAR | Status: DC | PRN
  Administered 2018-08-01: 60 mg via INTRAVENOUS

## 2018-08-01 MED ORDER — MIDAZOLAM HCL 2 MG/2ML IJ SOLN
INTRAMUSCULAR | Status: DC | PRN
  Administered 2018-08-01: 2 mg via INTRAVENOUS

## 2018-08-01 MED ORDER — MEPERIDINE HCL 50 MG/ML IJ SOLN: 6.2500 mg | INTRAMUSCULAR | Status: DC | PRN

## 2018-08-01 MED ORDER — 0.9 % SODIUM CHLORIDE (POUR BTL) OPTIME
TOPICAL | Status: DC | PRN
  Administered 2018-08-01: 1000 mL

## 2018-08-01 MED ORDER — MENTHOL 3 MG MT LOZG: 1.0000 | LOZENGE | OROMUCOSAL | Status: DC | PRN

## 2018-08-01 MED ORDER — MORPHINE SULFATE (PF) 2 MG/ML IV SOLN: 2.0000 mg | INTRAVENOUS | Status: DC | PRN

## 2018-08-01 MED ORDER — SODIUM CHLORIDE 0.9% FLUSH: 3.0000 mL | Freq: Two times a day (BID) | INTRAVENOUS | Status: DC

## 2018-08-01 MED ORDER — SODIUM CHLORIDE 0.9 % IV SOLN
INTRAVENOUS | Status: DC | PRN
  Administered 2018-08-01: 07:00:00

## 2018-08-01 MED ORDER — AMLODIPINE BESY-BENAZEPRIL HCL 10-40 MG PO CAPS: 1.0000 | ORAL_CAPSULE | Freq: Every day | ORAL | Status: DC

## 2018-08-01 MED ORDER — ROCURONIUM BROMIDE 10 MG/ML (PF) SYRINGE
PREFILLED_SYRINGE | INTRAVENOUS | Status: DC | PRN
  Administered 2018-08-01: 20 mg via INTRAVENOUS
  Administered 2018-08-01: 50 mg via INTRAVENOUS

## 2018-08-01 MED ORDER — GABAPENTIN 300 MG PO CAPS: 300.0000 mg | ORAL_CAPSULE | Freq: Three times a day (TID) | ORAL | Status: DC

## 2018-08-01 MED ORDER — LACTATED RINGERS IV SOLN
INTRAVENOUS | Status: DC | PRN
  Administered 2018-08-01: 07:00:00 via INTRAVENOUS

## 2018-08-01 MED ORDER — PHENOL 1.4 % MT LIQD: 1.0000 | OROMUCOSAL | Status: DC | PRN

## 2018-08-01 MED ORDER — SUGAMMADEX SODIUM 200 MG/2ML IV SOLN
INTRAVENOUS | Status: DC | PRN
  Administered 2018-08-01: 180 mg via INTRAVENOUS

## 2018-08-01 MED ORDER — METOPROLOL SUCCINATE ER 25 MG PO TB24
ORAL_TABLET | ORAL | Status: AC
  Administered 2018-08-01: 50 mg via ORAL
  Filled 2018-08-01: qty 2

## 2018-08-01 MED ORDER — TIZANIDINE HCL 2 MG PO TABS: 2.0000 mg | ORAL_TABLET | Freq: Four times a day (QID) | ORAL | 1 refills | Status: DC | PRN

## 2018-08-01 MED ORDER — THROMBIN 5000 UNITS EX SOLR
OROMUCOSAL | Status: DC | PRN
  Administered 2018-08-01: 08:00:00 via TOPICAL

## 2018-08-01 MED ORDER — HYDROCHLOROTHIAZIDE 25 MG PO TABS: 25.0000 mg | ORAL_TABLET | Freq: Every day | ORAL | Status: DC

## 2018-08-01 MED ORDER — SODIUM CHLORIDE 0.9 % IV SOLN: 250.0000 mL | INTRAVENOUS | Status: DC

## 2018-08-01 MED ORDER — CEFAZOLIN SODIUM-DEXTROSE 2-4 GM/100ML-% IV SOLN
2.0000 g | INTRAVENOUS | Status: AC
  Administered 2018-08-01: 2 g via INTRAVENOUS

## 2018-08-01 MED ORDER — ONDANSETRON HCL 4 MG/2ML IJ SOLN
INTRAMUSCULAR | Status: AC
  Filled 2018-08-01: qty 2

## 2018-08-01 MED ORDER — PROMETHAZINE HCL 25 MG/ML IJ SOLN: 6.2500 mg | INTRAMUSCULAR | Status: DC | PRN

## 2018-08-01 MED ORDER — PHENYLEPHRINE 40 MCG/ML (10ML) SYRINGE FOR IV PUSH (FOR BLOOD PRESSURE SUPPORT)
PREFILLED_SYRINGE | INTRAVENOUS | Status: DC | PRN
  Administered 2018-08-01 (×2): 80 ug via INTRAVENOUS

## 2018-08-01 MED ORDER — PROPOFOL 10 MG/ML IV BOLUS
INTRAVENOUS | Status: DC | PRN
  Administered 2018-08-01: 120 mg via INTRAVENOUS

## 2018-08-01 MED ORDER — PANTOPRAZOLE SODIUM 40 MG PO TBEC: 40.0000 mg | DELAYED_RELEASE_TABLET | Freq: Every day | ORAL | Status: DC

## 2018-08-01 MED ORDER — ACETAMINOPHEN 325 MG PO TABS: 650.0000 mg | ORAL_TABLET | ORAL | Status: DC | PRN

## 2018-08-01 MED ORDER — CHLORHEXIDINE GLUCONATE CLOTH 2 % EX PADS: 6.0000 | MEDICATED_PAD | Freq: Once | CUTANEOUS | Status: DC

## 2018-08-01 MED ORDER — REPAGLINIDE 1 MG PO TABS
1.0000 mg | ORAL_TABLET | Freq: Three times a day (TID) | ORAL | Status: DC
  Filled 2018-08-01 (×2): qty 1

## 2018-08-01 MED ORDER — FENTANYL CITRATE (PF) 250 MCG/5ML IJ SOLN
INTRAMUSCULAR | Status: AC
  Filled 2018-08-01: qty 5

## 2018-08-01 MED ORDER — OXYCODONE HCL 5 MG PO TABS
5.0000 mg | ORAL_TABLET | ORAL | Status: DC | PRN
  Administered 2018-08-01: 5 mg via ORAL

## 2018-08-01 MED ORDER — BUPIVACAINE HCL (PF) 0.25 % IJ SOLN
INTRAMUSCULAR | Status: AC
  Filled 2018-08-01: qty 30

## 2018-08-01 MED ORDER — METOPROLOL SUCCINATE ER 50 MG PO TB24: 50.0000 mg | ORAL_TABLET | Freq: Every day | ORAL | Status: DC

## 2018-08-01 MED ORDER — ONDANSETRON HCL 4 MG PO TABS: 4.0000 mg | ORAL_TABLET | Freq: Four times a day (QID) | ORAL | Status: DC | PRN

## 2018-08-01 SURGICAL SUPPLY — 48 items
BAG DECANTER FOR FLEXI CONT (MISCELLANEOUS) ×2 IMPLANT
BENZOIN TINCTURE PRP APPL 2/3 (GAUZE/BANDAGES/DRESSINGS) ×2 IMPLANT
BUR MATCHSTICK NEURO 3.0 LAGG (BURR) ×2 IMPLANT
CANISTER SUCT 3000ML PPV (MISCELLANEOUS) ×2 IMPLANT
CARTRIDGE OIL MAESTRO DRILL (MISCELLANEOUS) ×1 IMPLANT
COVER WAND RF STERILE (DRAPES) ×2 IMPLANT
DERMABOND ADVANCED (GAUZE/BANDAGES/DRESSINGS) ×1
DERMABOND ADVANCED .7 DNX12 (GAUZE/BANDAGES/DRESSINGS) ×1 IMPLANT
DIFFUSER DRILL AIR PNEUMATIC (MISCELLANEOUS) ×2 IMPLANT
DRAPE LAPAROTOMY 100X72X124 (DRAPES) ×2 IMPLANT
DRAPE MICROSCOPE LEICA (MISCELLANEOUS) ×2 IMPLANT
DRAPE POUCH INSTRU U-SHP 10X18 (DRAPES) ×2 IMPLANT
DRAPE SURG 17X23 STRL (DRAPES) ×2 IMPLANT
DRSG OPSITE POSTOP 3X4 (GAUZE/BANDAGES/DRESSINGS) ×2 IMPLANT
DURAPREP 26ML APPLICATOR (WOUND CARE) ×2 IMPLANT
ELECT REM PT RETURN 9FT ADLT (ELECTROSURGICAL) ×2
ELECTRODE REM PT RTRN 9FT ADLT (ELECTROSURGICAL) ×1 IMPLANT
GAUZE 4X4 16PLY RFD (DISPOSABLE) IMPLANT
GLOVE BIO SURGEON STRL SZ7 (GLOVE) ×4 IMPLANT
GLOVE BIO SURGEON STRL SZ8 (GLOVE) ×2 IMPLANT
GLOVE BIOGEL PI IND STRL 7.0 (GLOVE) ×1 IMPLANT
GLOVE BIOGEL PI IND STRL 7.5 (GLOVE) ×2 IMPLANT
GLOVE BIOGEL PI INDICATOR 7.0 (GLOVE) ×1
GLOVE BIOGEL PI INDICATOR 7.5 (GLOVE) ×2
GLOVE SURG SS PI 7.0 STRL IVOR (GLOVE) ×6 IMPLANT
GOWN STRL REUS W/ TWL LRG LVL3 (GOWN DISPOSABLE) ×3 IMPLANT
GOWN STRL REUS W/ TWL XL LVL3 (GOWN DISPOSABLE) ×1 IMPLANT
GOWN STRL REUS W/TWL 2XL LVL3 (GOWN DISPOSABLE) IMPLANT
GOWN STRL REUS W/TWL LRG LVL3 (GOWN DISPOSABLE) ×3
GOWN STRL REUS W/TWL XL LVL3 (GOWN DISPOSABLE) ×1
KIT BASIN OR (CUSTOM PROCEDURE TRAY) ×2 IMPLANT
KIT TURNOVER KIT B (KITS) ×2 IMPLANT
NEEDLE HYPO 25X1 1.5 SAFETY (NEEDLE) ×2 IMPLANT
NEEDLE SPNL 20GX3.5 QUINCKE YW (NEEDLE) ×2 IMPLANT
NS IRRIG 1000ML POUR BTL (IV SOLUTION) ×2 IMPLANT
OIL CARTRIDGE MAESTRO DRILL (MISCELLANEOUS) ×2
PACK LAMINECTOMY NEURO (CUSTOM PROCEDURE TRAY) ×2 IMPLANT
PAD ARMBOARD 7.5X6 YLW CONV (MISCELLANEOUS) ×6 IMPLANT
RUBBERBAND STERILE (MISCELLANEOUS) ×4 IMPLANT
SPONGE SURGIFOAM ABS GEL SZ50 (HEMOSTASIS) ×2 IMPLANT
STRIP CLOSURE SKIN 1/2X4 (GAUZE/BANDAGES/DRESSINGS) ×2 IMPLANT
SUT VIC AB 0 CT1 18XCR BRD8 (SUTURE) ×1 IMPLANT
SUT VIC AB 0 CT1 8-18 (SUTURE) ×1
SUT VIC AB 2-0 CP2 18 (SUTURE) ×2 IMPLANT
SUT VIC AB 3-0 SH 8-18 (SUTURE) ×4 IMPLANT
TOWEL GREEN STERILE (TOWEL DISPOSABLE) ×2 IMPLANT
TOWEL GREEN STERILE FF (TOWEL DISPOSABLE) ×2 IMPLANT
WATER STERILE IRR 1000ML POUR (IV SOLUTION) ×2 IMPLANT

## 2018-08-01 NOTE — Op Note (Signed)
08/01/2018  8:55 AM  PATIENT:  Luis Lindsey  68 y.o. 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  BLOOD ADMINISTERED: none  DRAINS: none  SPECIMEN:  none  INDICATION FOR PROCEDURE: This patient presented with severe left leg pain in an L5 distribution. Imaging showed large left L4-5 disc herniation. The patient tried conservative measures without relief. Pain was debilitating. Recommended left L4-5 microdiscectomy. Patient understood the risks, benefits, and alternatives and potential outcomes and wished to proceed.  PROCEDURE DETAILS: The patient was taken to the operating room and after induction of adequate generalized endotracheal anesthesia, the patient was rolled into the prone position on the Wilson frame and all pressure points were padded. The lumbar region was cleaned and then prepped with DuraPrep and draped in the usual sterile fashion. 5 cc of local anesthesia was injected and then a dorsal midline incision was made and carried down to the lumbo sacral fascia. The fascia was opened and the paraspinous musculature was taken down in a subperiosteal fashion to expose L4-5 on the left. Intraoperative x-ray confirmed my level, and then I used a combination of the high-speed drill and the Kerrison punches to perform a hemilaminectomy, medial facetectomy, and foraminotomy at L4-5 on the left. The underlying yellow ligament was opened and removed in a piecemeal fashion to expose the underlying dura and exiting nerve root. I undercut the lateral recess and dissected down until I was medial to and distal to the pedicle. The nerve root was well decompressed. We then gently retracted the nerve root medially with a retractor, coagulated the epidural venous  vasculature, and incised the disc space and found a large left L4-5 disc herniation compressing the left L5 nerve root. I performed a thorough intradiscal discectomy with pituitary rongeurs and curettes, until I had a nice decompression of the nerve root and the midline. I then palpated with a coronary dilator along the nerve root and into the foramen to assure adequate decompression. I felt no more compression of the nerve root. I irrigated with saline solution containing bacitracin. Achieved hemostasis with bipolar cautery, lined the dura with Gelfoam, and then closed the fascia with 0 Vicryl. I closed the subcutaneous tissues with 2-0 Vicryl and the subcuticular tissues with 3-0 Vicryl. The skin was then closed with benzoin and Steri-Strips. The drapes were removed, a sterile dressing was applied. The patient was awakened from general anesthesia and transferred to the recovery room in stable condition. At the end of the procedure all sponge, needle and instrument counts were correct.    PLAN OF CARE: Admit for overnight observation  PATIENT DISPOSITION:  PACU - hemodynamically stable.   Delay start of Pharmacological VTE agent (>24hrs) due to surgical blood loss or risk of bleeding:  yes

## 2018-08-01 NOTE — Progress Notes (Signed)
Pt up in wheelchair ready for transport to 3c

## 2018-08-01 NOTE — Progress Notes (Signed)
Patient is discharged from room 3C02 at this time. Alert and in stable condition. IV site d/c'd and instructions read to patient and spouse with understanding verbalized. Ambulate out of unit with wife and belongings at side.

## 2018-08-01 NOTE — Discharge Summary (Signed)
Physician Discharge Summary  Patient ID: Luis Lindsey MRN: [id number] DOB/AGE: 09/12/1950 68 y.o.  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 The patient tolerated the procedure well and was taken to the recovery room and then to the floor in stable condition. The hospital course was routine. There were no complications. The wound remained clean dry and intact. Pt had appropriate back soreness. No complaints of leg pain or new N/T/W. The patient remained afebrile with stable vital signs, and tolerated a regular diet. The patient continued to increase activities, and pain was well controlled with oral pain medications.   Consults: None  Significant Diagnostic Studies:  Results for orders placed or performed during the hospital encounter of 08/01/18  Glucose, capillary  Result Value Ref Range   Glucose-Capillary 110 (H) 70 - 99 mg/dL   Comment 1 Notify RN    Comment 2 Document in Chart   Glucose, capillary  Result Value Ref Range   Glucose-Capillary 84 70 - 99 mg/dL  Glucose, capillary  Result Value Ref Range   Glucose-Capillary 133 (H) 70 - 99 mg/dL    Vas [place] Abi With/wo Tbi  Result Date: 07/22/2018 LOWER EXTREMITY DOPPLER STUDY Indications: Claudication, peripheral artery disease, and Follow up left              external iliac stent. High Risk         Hypertension, hyperlipidemia, Diabetes, past history of Factors:          smoking.  Vascular Interventions: 06/18/2018: left external iliac stent placement. Performing Technologist: Burley Saver RVT  Examination Guidelines: A complete evaluation includes at minimum, Doppler waveform signals and systolic blood pressure reading at the level of bilateral brachial, anterior tibial, and posterior tibial  arteries, when vessel segments are accessible. Bilateral testing is considered an integral part of a complete examination. Photoelectric Plethysmograph (PPG) waveforms and toe systolic pressure readings are included as required and additional duplex testing as needed. Limited examinations for reoccurring indications may be performed as noted.  ABI Findings: +---------+------------------+-----+--------+--------+ Right    Rt Pressure (mmHg)IndexWaveformComment  +---------+------------------+-----+--------+--------+ Brachial 163                                     +---------+------------------+-----+--------+--------+ PTA      136               0.78 biphasic         +---------+------------------+-----+--------+--------+ DP       138               0.79 biphasic         +---------+------------------+-----+--------+--------+ Great Toe114               0.65 Abnormal         +---------+------------------+-----+--------+--------+ +---------+------------------+-----+----------+--------------+ Left     Lt Pressure (mmHg)IndexWaveform  Comment        +---------+------------------+-----+----------+--------------+ Brachial 175                                             +---------+------------------+-----+----------+--------------+ PTA      121  0.69 monophasicsharp upstroke +---------+------------------+-----+----------+--------------+ DP       119               0.68 monophasic               +---------+------------------+-----+----------+--------------+ Great Toe84                0.48 Abnormal                 +---------+------------------+-----+----------+--------------+ +-------+-----------+-----------+------------+------------+ ABI/TBIToday's ABIToday's TBIPrevious ABIPrevious TBI +-------+-----------+-----------+------------+------------+ Right  0.79       0.65       0.75        0.51          +-------+-----------+-----------+------------+------------+ Left   0.69       0.48       0.59        0.21         +-------+-----------+-----------+------------+------------+ Previous study performed at [facility] 05/02/2018. Right ABIs and TBIs appear essentially unchanged compared to prior study on 05/02/2018. Left ABIs and TBIs appear slightly increased compared to prior study on 05/02/2018.  Summary: Right: Resting right ankle-brachial index indicates moderate right lower extremity arterial disease. The right toe-brachial index is abnormal. RT great toe pressure = 114 mmHg. PPG tracings appear dampened. Left: Resting left ankle-brachial index indicates moderate left lower extremity arterial disease. The left toe-brachial index is abnormal. LT Great toe pressure = 84 mmHg. PPG tracings appear dampened.  *See table(s) above for measurements and observations.  Electronically signed by Monica Martinez MD on 07/22/2018 at 12:15:23 PM.    Final    Vas US Aorta/ivc/iliacs  Result Date: 07/22/2018 ABDOMINAL AORTA STUDY Indications: Surgery date 06/18/2018. Left external iliac stent placement Risk Factors: Hypertension, hyperlipidemia, Diabetes, past history of smoking. Limitations: Air/bowel gas.  Comparison Study: This is the first post-operative study. Performing Technologist: Burley Saver RVT  Examination Guidelines: A complete evaluation includes B-mode imaging, spectral Doppler, color Doppler, and power Doppler as needed of all accessible portions of each vessel. Bilateral testing is considered an integral part of a complete examination. Limited examinations for reoccurring indications may be performed as noted.  Abdominal Aorta Findings: +-------------+-------+----------+----------+--------+--------+--------+ Location     AP (cm)Trans (cm)PSV (cm/s)WaveformThrombusComments +-------------+-------+----------+----------+--------+--------+--------+ Distal                        153                                 +-------------+-------+----------+----------+--------+--------+--------+ LT CIA Prox                   121                                +-------------+-------+----------+----------+--------+--------+--------+ LT CIA Mid                    122                                +-------------+-------+----------+----------+--------+--------+--------+ LT EIA Mid                    126                                +-------------+-------+----------+----------+--------+--------+--------+  LT EIA Distal                 160                                +-------------+-------+----------+----------+--------+--------+--------+  Left Stent(s): +---------------------+--------+---------------+--------+--------+ External iliac arteryPSV cm/sStenosis       WaveformComments +---------------------+--------+---------------+--------+--------+ Prox to Stent        146                    biphasic         +---------------------+--------+---------------+--------+--------+ Proximal Stent       249     50-99% stenosisbiphasic         +---------------------+--------+---------------+--------+--------+ Mid Stent            179                    biphasic         +---------------------+--------+---------------+--------+--------+ Distal Stent         137                    biphasic         +---------------------+--------+---------------+--------+--------+ Distal to Stent      197                    biphasic         +---------------------+--------+---------------+--------+--------+  Summary: Stenosis: +-------------------+---------------+ Location           Stent           +-------------------+---------------+ Left External Iliac50-99% stenosis +-------------------+---------------+ Velocities suggestive of a >50% stenosis in the proximal stent, however stent walls not adequately visualized.  *See table(s) above for measurements and observations.   Electronically signed by Monica Martinez MD on 07/22/2018 at 12:17:13 PM.    Final     Antibiotics:  Anti-infectives (From admission, onward)   Start     Dose/Rate Route Frequency Ordered Stop   08/01/18 1145  ceFAZolin (ANCEF) IVPB 2g/100 mL premix     2 g 200 mL/hr over 30 Minutes Intravenous Every 8 hours 08/01/18 1138 08/02/18 0344   08/01/18 0720  bacitracin 50,000 Units in sodium chloride 0.9 % 500 mL irrigation  Status:  Discontinued       As needed 08/01/18 0741 08/01/18 0901   08/01/18 0630  ceFAZolin (ANCEF) IVPB 2g/100 mL premix     2 g 200 mL/hr over 30 Minutes Intravenous On call to O.R. 08/01/18 0618 08/01/18 0750   08/01/18 0620  ceFAZolin (ANCEF) 2-4 GM/100ML-% IVPB    Note to Pharmacy:  Maryjean Ka   : cabinet override      08/01/18 0620 08/01/18 0750      Discharge Exam: Blood pressure 130/65, pulse (!) 57, temperature 97.7 F (36.5 C), temperature source Oral, resp. rate 18, height 5\' 9"  (1.753 m), weight 89.4 kg, SpO2 96 %. Neurologic: Grossly normal dressing dry  Discharge Medications:   Allergies as of 08/01/2018      Reactions   Codeine Nausea Only      Medication List    STOP taking these medications   clopidogrel 75 MG tablet Commonly known as:  Plavix     TAKE these medications   amLODipine-benazepril 10-40 MG capsule Commonly known as:  LOTREL Take 1 capsule by mouth daily.   aspirin 325 MG tablet Commonly known as:  Bayer Aspirin Take 1 tablet (325 mg  total) by mouth daily.   gabapentin 300 MG capsule Commonly known as:  NEURONTIN TAKE 1 CAPSULE BY MOUTH THREE TIMES A DAY What changed:  See the new instructions.   hydrochlorothiazide 25 MG tablet Commonly known as:  HYDRODIURIL Take 25 mg by mouth daily.   metFORMIN 500 MG tablet Commonly known as:  GLUCOPHAGE 2 tablets by mouth twice daily What changed:    how much to take  how to take this  when to take this  additional instructions   metoprolol succinate 50 MG 24 hr  tablet Commonly known as:  TOPROL-XL Take 50 mg by mouth daily. Take with or immediately following a meal.   oxyCODONE 5 MG immediate release tablet Commonly known as:  Oxy IR/ROXICODONE Take 1 tablet (5 mg total) by mouth every 4 (four) hours as needed for moderate pain ((score 4 to 6)).   pantoprazole 40 MG tablet Commonly known as:  PROTONIX Take 40 mg by mouth daily as needed.   pioglitazone 45 MG tablet Commonly known as:  ACTOS TAKE 1 TABLET DAILY (NEED APPOINTMENT FOR FURTHER REFILLS) What changed:  See the new instructions.   repaglinide 0.5 MG tablet Commonly known as:  PRANDIN Take 1 mg by mouth 3 (three) times daily before meals.   rosuvastatin 40 MG tablet Commonly known as:  CRESTOR Take 40 mg by mouth daily.   tiZANidine 2 MG tablet Commonly known as:  ZANAFLEX Take 1 tablet (2 mg total) by mouth every 6 (six) hours as needed.   Tyler Aas FlexTouch 100 UNIT/ML Sopn FlexTouch Pen Generic drug:  insulin degludec Inject 60 Units into the skin daily.       Disposition: home   Final Dx: L L4-5 microdiskectomy  Discharge Instructions     Remove dressing in 72 hours   Complete by:  As directed    Call MD for:  difficulty breathing, headache or visual disturbances   Complete by:  As directed    Call MD for:  persistant nausea and vomiting   Complete by:  As directed    Call MD for:  redness, tenderness, or signs of infection (pain, swelling, redness, odor or green/yellow discharge around incision site)   Complete by:  As directed    Call MD for:  severe uncontrolled pain   Complete by:  As directed    Call MD for:  temperature >100.4   Complete by:  As directed    Diet - low sodium heart healthy   Complete by:  As directed    Increase activity slowly   Complete by:  As directed          Signed: Eustace Moore 08/01/2018, 2:10 PM

## 2018-08-01 NOTE — Anesthesia Postprocedure Evaluation (Signed)
Anesthesia Post Note  Patient: Luis Lindsey  Procedure(s) Performed: Microdiscectomy - Lumbar four-Lumbar five - left (Left Back)     Patient location during evaluation: PACU Anesthesia Type: General Level of consciousness: sedated and patient cooperative Pain management: pain level controlled Vital Signs Assessment: post-procedure vital signs reviewed and stable Respiratory status: spontaneous breathing Cardiovascular status: stable Anesthetic complications: no    Last Vitals:  Vitals:   08/01/18 1030 08/01/18 1050  BP: 138/62 130/65  Pulse: 63 (!) 57  Resp: 16 18  Temp: 36.6 C 36.5 C  SpO2: 97% 96%    Last Pain:  Vitals:   08/01/18 1050  TempSrc: Oral  PainSc:                  Nolon Nations

## 2018-08-01 NOTE — Anesthesia Procedure Notes (Signed)
Procedure Name: Intubation Date/Time: 08/01/2018 7:48 AM Performed by: Bryson Corona, CRNA Pre-anesthesia Checklist: Patient identified, Emergency Drugs available, Suction available and Patient being monitored Patient Re-evaluated:Patient Re-evaluated prior to induction Oxygen Delivery Method: Circle System Utilized Preoxygenation: Pre-oxygenation with 100% oxygen Induction Type: IV induction Ventilation: Oral airway inserted - appropriate to patient size Laryngoscope Size: Mac and 3 Grade View: Grade I Tube type: Oral Tube size: 7.0 mm Number of attempts: 1 Airway Equipment and Method: Stylet and Oral airway Placement Confirmation: ETT inserted through vocal cords under direct vision,  positive ETCO2 and breath sounds checked- equal and bilateral Secured at: 22 cm Tube secured with: Tape Dental Injury: Teeth and Oropharynx as per pre-operative assessment

## 2018-08-01 NOTE — H&P (Signed)
Subjective: Patient is a 68 y.o. male admitted for leg pain. Onset of symptoms was several months ago, gradually worsening since that time.  The pain is rated severe, and is located at the across the lower back and radiates to LLE. The pain is described as aching and occurs all day. The symptoms have been progressive. Symptoms are exacerbated by exercise. MRI or CT showed HNP L4-5 L   Past Medical History:  Diagnosis Date  . Arthritis   . BCC (basal cell carcinoma)   . BPH (benign prostatic hyperplasia)   . CAROTID BRUIT, RIGHT 11/30/2009  . DM 11/30/2009  . GERD (gastroesophageal reflux disease)   . Heart murmur   . History of hiatal hernia   . History of Holter monitoring 2009  . History of kidney stones   . HYPERCHOLESTEROLEMIA 11/30/2009  . HYPERTENSION 11/30/2009  . Kidney stones   . Peripheral vascular disease ([place])   . Right carotid bruit   . SMOKER 11/30/2009  . Stroke ([place]) 2012  . URINARY CALCULUS 11/30/2009    Past Surgical History:  Procedure Laterality Date  . ABDOMINAL AORTOGRAM W/LOWER EXTREMITY N/A 06/18/2018   Procedure: ABDOMINAL AORTOGRAM W/LOWER EXTREMITY;  Surgeon: Marty Heck, MD;  Location: [facility];  Service: Cardiovascular;  Laterality: N/A;  . arthroscopy of right shoulder Right    Jan 2019, June 2019-- done at [place], Dr. Ninfa Linden  . CATARACT EXTRACTION W/ INTRAOCULAR LENS  IMPLANT, BILATERAL    . CYSTOSCOPY    . FACIAL COSMETIC SURGERY  2014  . FINGER SURGERY  2002  . NECK SURGERY  1986  . PERIPHERAL VASCULAR INTERVENTION  06/18/2018   Procedure: PERIPHERAL VASCULAR INTERVENTION;  Surgeon: Marty Heck, MD;  Location: [facility];  Service: Cardiovascular;;  Left external iliac  . ROTATOR CUFF REPAIR Left   . UMBILICAL HERNIA REPAIR      Prior to Admission medications   Medication Sig Start Date End Date Taking? Authorizing Provider  amLODipine-benazepril (LOTREL) 10-40 MG per capsule Take 1 capsule by  mouth daily.     Yes [provider]  aspirin (BAYER ASPIRIN) 325 MG tablet Take 1 tablet (325 mg total) by mouth daily. 12/26/12  Yes Drema Dallas, DO  gabapentin (NEURONTIN) 300 MG capsule TAKE 1 CAPSULE BY MOUTH THREE TIMES A DAY Patient taking differently: Take 300 mg by mouth 3 (three) times daily.  04/23/18  Yes Marcial Pacas, MD  hydrochlorothiazide (HYDRODIURIL) 25 MG tablet Take 25 mg by mouth daily.     Yes [provider]  metFORMIN (GLUCOPHAGE) 500 MG tablet 2 tablets by mouth twice daily Patient taking differently: Take 1,000 mg by mouth 2 (two) times daily with a meal.  09/06/14  Yes Renato Shin, MD  metoprolol succinate (TOPROL-XL) 50 MG 24 hr tablet Take 50 mg by mouth daily. Take with or immediately following a meal.   Yes [provider]  pantoprazole (PROTONIX) 40 MG tablet Take 40 mg by mouth daily as needed. 07/02/18  Yes [provider]  pioglitazone (ACTOS) 45 MG tablet TAKE 1 TABLET DAILY (NEED APPOINTMENT FOR FURTHER REFILLS) Patient taking differently: Take 45 mg by mouth daily.  04/28/14  Yes Renato Shin, MD  repaglinide (PRANDIN) 0.5 MG tablet Take 1 mg by mouth 3 (three) times daily before meals.  02/21/18  Yes [provider]  rosuvastatin (CRESTOR) 40 MG tablet Take 40 mg by mouth daily.    Yes [provider]  [facility]  FLEXTOUCH 100 UNIT/ML SOPN FlexTouch Pen Inject 60 Units into the skin daily.  02/11/17  Yes [provider]  clopidogrel (PLAVIX) 75 MG tablet Take 1 tablet (75 mg total) by mouth daily. 06/18/18 06/18/19  Marty Heck, MD   Allergies  Allergen Reactions  . Codeine Nausea Only    Social History   Tobacco Use  . Smoking status: Former Smoker    Years: 40.00    Types: Cigarettes    Last attempt to quit: 12/2015    Years since quitting: 2.5  . Smokeless tobacco: Never Used  . Tobacco comment: Quit 12-2012  Substance Use Topics  . Alcohol use: No    Family History  Problem  Relation Age of Onset  . Diabetes Sister   . Diabetes Mellitus I Sister   . Stroke Father   . Heart disease Father   . CVA Father   . Coronary artery disease Father   . Diabetes Mother   . Heart disease Mother   . Diabetes Mellitus I Mother   . Coronary artery disease Mother   . Coronary artery disease Brother   . Heart disease Brother   . Coronary artery disease Brother      Review of Systems  Positive ROS: neg  All other systems have been reviewed and were otherwise negative with the exception of those mentioned in the HPI and as above.  Objective: Vital signs in last 24 hours: Temp:  [97.9 F (36.6 C)] 97.9 F (36.6 C) (03/06 0643) Pulse Rate:  [61] 61 (03/06 0643) Resp:  [18] 18 (03/06 0643) BP: (163)/(46) 163/46 (03/06 0643) SpO2:  [100 %] 100 % (03/06 0643) Weight:  [89.4 kg] 89.4 kg (03/06 0643)  General Appearance: Alert, cooperative, no distress, appears stated age Head: Normocephalic, without obvious abnormality, atraumatic Eyes: PERRL, conjunctiva/corneas clear, EOM's intact    Neck: Supple, symmetrical, trachea midline Back: Symmetric, no curvature, ROM normal, no CVA tenderness Lungs:  respirations unlabored Heart: Regular rate and rhythm Abdomen: Soft, non-tender Extremities: Extremities normal, atraumatic, no cyanosis or edema Pulses: 2+ and symmetric all extremities Skin: Skin color, texture, turgor normal, no rashes or lesions  NEUROLOGIC:   Mental status: Alert and oriented x4,  no aphasia, good attention span, fund of knowledge, and memory Motor Exam - grossly normal Sensory Exam - grossly normal Reflexes: 1+ Coordination - grossly normal Gait - grossly normal Balance - grossly normal Cranial Nerves: I: smell Not tested  II: visual acuity  OS: nl    OD: nl  II: visual fields Full to confrontation  II: pupils Equal, round, reactive to light  III,VII: ptosis None  III,IV,VI: extraocular muscles  Full ROM  V: mastication Normal  V: facial  light touch sensation  Normal  V,VII: corneal reflex  Present  VII: facial muscle function - upper  Normal  VII: facial muscle function - lower Normal  VIII: hearing Not tested  IX: soft palate elevation  Normal  IX,X: gag reflex Present  XI: trapezius strength  5/5  XI: sternocleidomastoid strength 5/5  XI: neck flexion strength  5/5  XII: tongue strength  Normal    Data Review Lab Results  Component Value Date   WBC 6.5 07/25/2018   HGB 13.1 07/25/2018   HCT 41.2 07/25/2018   MCV 88.2 07/25/2018   PLT 333 07/25/2018   Lab Results  Component Value Date   NA 136 07/25/2018   K 3.8 07/25/2018   CL 102 07/25/2018   CO2 25 07/25/2018  BUN 17 07/25/2018   CREATININE 0.86 07/25/2018   GLUCOSE 216 (H) 07/25/2018   Lab Results  Component Value Date   INR 0.98 06/20/2018    Assessment/Plan:  Estimated body mass index is 29.09 kg/m as calculated from the following:   Height as of this encounter: 5\' 9"  (1.753 m).   Weight as of this encounter: 89.4 kg. Patient admitted for L L4-5 microdiskectomy. Patient has failed a reasonable attempt at conservative therapy.  I explained the condition and procedure to the patient and answered any questions.  Patient wishes to proceed with procedure as planned. Understands risks/ benefits and typical outcomes of procedure.   Eustace Moore 08/01/2018 7:11 AM

## 2018-08-01 NOTE — Transfer of Care (Signed)
Immediate Anesthesia Transfer of Care Note  Patient: Luis Lindsey  Procedure(s) Performed: Microdiscectomy - Lumbar four-Lumbar five - left (Left Back)  Patient Location: PACU  Anesthesia Type:General  Level of Consciousness: awake and patient cooperative  Airway & Oxygen Therapy: Patient Spontanous Breathing and Patient connected to nasal cannula oxygen  Post-op Assessment: Report given to RN and Post -op Vital signs reviewed and stable  Post vital signs: Reviewed and stable  Last Vitals:  Vitals Value Taken Time  BP    Temp    Pulse 65 08/01/2018  9:06 AM  Resp 20 08/01/2018  9:06 AM  SpO2 100 % 08/01/2018  9:06 AM  Vitals shown include unvalidated device data.  Last Pain:  Vitals:   08/01/18 0643  TempSrc: Oral  PainSc:          Complications: No apparent anesthesia complications

## 2018-08-01 NOTE — Discharge Instructions (Signed)

## 2018-08-02 ENCOUNTER — Encounter (HOSPITAL_COMMUNITY): Payer: Self-pay | Admitting: Neurological Surgery

## 2018-08-13 ENCOUNTER — Telehealth (INDEPENDENT_AMBULATORY_CARE_PROVIDER_SITE_OTHER): Payer: Medicare Other | Admitting: Neurology

## 2018-08-13 DIAGNOSIS — I70212 Atherosclerosis of native arteries of extremities with intermittent claudication, left leg: Secondary | ICD-10-CM

## 2018-08-13 DIAGNOSIS — M5416 Radiculopathy, lumbar region: Secondary | ICD-10-CM

## 2018-08-13 DIAGNOSIS — Z9889 Other specified postprocedural states: Secondary | ICD-10-CM

## 2018-08-13 DIAGNOSIS — I739 Peripheral vascular disease, unspecified: Secondary | ICD-10-CM

## 2018-08-13 NOTE — Telephone Encounter (Addendum)
Chief Complains/Reason for telephone visit:  Names of all people present and their role:  Relevant History:  Luis Lindsey is a 68 years old male, seen in request by his primary care physician Dr. Donnie Coffin for evaluation of peripheral neuropathy, initial evaluation was on April 01, 2018.  I have reviewed and summarized the referring note from the referring physician.  He has past medical history of hypertension, diabetes 20 years, hyperlipidemia, previously heavy smoker of 1 pack a day for 40 years, stopped in 2017  Since 2017, he noticed numbness tingling burning sensation at the bottom of his feet, gradually work its way up now to bilateral ankle level, since 2019, he also noticed mild bilateral fingertips numbness tingling, his fingertip tends to turn to white, sharp burning pain when he grab grocery from freezer.  Since 2016, he also noticed left lower extremity radiating pain, initially it does not put significant limitation to his daily function, now has worsening radiating pain, especially when walking, now he cannot finish shopping at a grocery store, developed left calf deep achy pain after walking short distance.  He denies similar involvement of right lower extremity, denies bowel and bladder incontinence.  Electrodiagnostic study on February 21, 2018 showed evidence of bilateral axonal sensorimotor polyneuropathy, there is no evidence of active lumbosacral radiculopathy.  He also reported, if he does not have enough water intake, he noticed more left lower extremity pain, left foot swelling will decrease with increasing water intake  MRI of lumbar spine in November 2019, L5-S1 large left paramedian disc herniation compressing the left S1 nerve roots within the left lateral recess,  X-ray of left hip in September 2019: Mild left hip joint degenerative changes  He did have left L4-5 microdiscectomy by Dr. Ronnald Ramp on August 01, 2018, now is back home, he continue complains of  left-sided low back pain, radiating pain to left buttock region, left foot numbness, pain has no significant improvement compared to presurgical level, is ambulating with a cane, going to be reevaluated by Dr. Ronnald Ramp soon  In addition, Doppler study of bilateral lower extremity showed moderate bilateral lower extremity arterial disease, with abnormal toe to brachial index bilaterally,  He was seen by vascular specialist Dr. Carlis Abbott, aortogram showed 60 to 70% stenosis of left external iliac artery, the midportion of artery well above the femoral head, left lower extremity arteriogram revealed calcification in the distal common femoral with a focal stenosis the proximal profunda and superficial femoral artery of approximately 50% each, right lower extremity arteriogram showed patent common femoral, profunda, superficial femoral artery.  He had a left external iliac artery stent placement with angioplasty,  Results Reviewed: Aortogram by Dr. Carlis Abbott on June 18, 2018  Assessment and Plan: Left lower extremity pain, claudication, gait abnormality  Evidence of left lumbar radiculopathy, left lower extremity vascular claudications  Status post left L4-5 discectomy, left external iliac artery stent placement with angioplasty  Continue follow-up with neurosurgeon, vascular surgeon,  Return to clinic in 3 to 4 months  Documentation of Time of Medical Discussion:    [id number] minutes of medical discussion  Addendum: I reviewed feedback neurosurgeon Dr. Roddie Mc on July 09, 2019,, he is referred to pain management to try to manage his pain, epidural injection,

## 2018-08-14 ENCOUNTER — Ambulatory Visit: Payer: Medicare Other | Admitting: Neurology

## 2018-08-22 ENCOUNTER — Other Ambulatory Visit: Payer: Self-pay | Admitting: Neurology

## 2018-09-24 ENCOUNTER — Other Ambulatory Visit: Payer: Self-pay

## 2018-09-24 DIAGNOSIS — I739 Peripheral vascular disease, unspecified: Secondary | ICD-10-CM

## 2018-10-08 ENCOUNTER — Other Ambulatory Visit: Payer: Self-pay | Admitting: Neurological Surgery

## 2018-10-10 NOTE — Pre-Procedure Instructions (Signed)
Luis Lindsey            10/10/18                          [id number] [name], [place] [address] Phone: [phone] Fax: [phone]              Your procedure is scheduled on March 6            Report to [place] [facility] at 5:30 A.M.            Call this number if you have problems the morning of surgery:            [phone]             Remember:            Do not eat or drink after midnight.                                    Take these medicines the morning of surgery with A SIP OF WATER :              Gabapentin (neurontin)             Metoprolol (toprol-xl)             Pantoprazole (protonix)  HYDROcodone-acetaminophen (NORCO/VICODIN)-as needed for pain.                     As of today, STOP taking Aleve, Naproxen, Ibuprofen, Motrin, Advil, Goody's, BC's, all herbal medications, fish oil, and all vitamins.                         Follow your surgeon's instructions on when to stop Asprin and plavix.  If no instructions were given by your surgeon then you will need to call the office to get those instructions.                       HOW TO MANAGE YOUR DIABETES BEFORE AND AFTER SURGERY  Why is it important to control my blood sugar before and after surgery?  Improving blood sugar levels before and after surgery helps healing and can limit problems.  A way of improving blood sugar control is eating a healthy diet by: ?  Eating less sugar and carbohydrates ?  Increasing activity/exercise ?  Talking with your doctor about reaching your blood sugar goals  High blood sugars (greater than 180 mg/dL) can raise your risk of infections and slow your recovery, so you will need to focus on controlling your diabetes during the weeks before surgery.  Make sure that the doctor who takes care of your diabetes knows about your planned surgery including  the date and location.  How do I manage my blood sugar before surgery?  Check your blood sugar at least 4 times a day, starting 2 days before surgery, to make sure that the level is not too high or low. ? Check your blood sugar the morning of your surgery when you wake up and every 2 hours until you get to the Short Stay unit.  If your blood sugar is less than 70 mg/dL, you will need to treat for low blood  sugar: ? Do not take insulin. ? Treat a low blood sugar (less than 70 mg/dL) with  cup of clear juice (cranberry or apple), 4glucose tablets, OR glucose gel. Recheck blood sugar in 15 minutes after treatment (to make sure it is greater than 70 mg/dL). If your blood sugar is not greater than 70 mg/dL on recheck, call [phone] ?  for further instructions.  Report your blood sugar to the short stay nurse when you get to Short Stay.   If you are admitted to the hospital after surgery: ? Your blood sugar will be checked by the staff and you will probably be given insulin after surgery (instead of oral diabetes medicines) to make sure you have good blood sugar levels. ? The goal for blood sugar control after surgery is 80-180 mg/dL.        WHAT DO I DO ABOUT MY DIABETES MEDICATION?   Do not take oral diabetes medicines (pills) the morning of surgery. pioglitazone (ACTOS), repaglinide (PRANDIN), metFORMIN (GLUCOPHAGE-XR).                                       THE MORNING OF SURGERY, take ______30_______ units of _____Tresiba_____insulin.               Do not wear jewelry.            Do not wear lotions, powders, or perfumes, or deodorant.            Do not shave 48 hours prior to surgery.  Men may shave face and neck.            Do not bring valuables to the hospital.            [facility] is not responsible for any belongings or valuables.  Contacts, dentures or bridgework may not be worn into surgery.  Leave your suitcase in the car.  After surgery it  may be brought to your room.  For patients admitted to the hospital, discharge time will be determined by your treatment team.  Patients discharged the day of surgery will not be allowed to drive home.    Special instructions:  [facility]- Preparing For Surgery  Before surgery, you can play an important role. Because skin is not sterile, your skin needs to be as free of germs as possible. You can reduce the number of germs on your skin by washing with CHG (chlorahexidine gluconate) Soap before surgery.  CHG is an antiseptic cleaner which kills germs and bonds with the skin to continue killing germs even after washing.    Oral Hygiene is also important to reduce your risk of infection.  Remember - BRUSH YOUR TEETH THE MORNING OF SURGERY WITH YOUR REGULAR TOOTHPASTE  Please do not use if you have an allergy to CHG or antibacterial soaps. If your skin becomes reddened/irritated stop using the CHG.  Do not shave (including legs and underarms) for at least 48 hours prior to first CHG shower. It is OK to shave your face.  Please follow these instructions carefully.  1. Shower the NIGHT BEFORE SURGERY and the MORNING OF SURGERY with CHG.   2. If you chose to wash your hair, wash your hair first as usual with your normal shampoo.  3. After you shampoo, rinse your hair and body thoroughly to remove the shampoo.  4. Use CHG as you would any other liquid soap. You can apply CHG directly to the skin and wash gently with a scrungie or a clean washcloth.   5. Apply the CHG Soap to your body ONLY FROM THE NECK DOWN.  Do not use on open wounds or open sores. Avoid contact with your eyes, ears, mouth and genitals (private parts). Wash Face and genitals (private parts)  with your normal soap.  6. Wash thoroughly, paying special attention to the area where your surgery will be  performed.  7. Thoroughly rinse your body with warm water from the neck down.  8. DO NOT shower/wash with your normal soap after using and rinsing off the CHG Soap.  9. Pat yourself dry with a CLEAN TOWEL.  10. Wear CLEAN PAJAMAS to bed the night before surgery, wear comfortable clothes the morning of surgery  11. Place CLEAN SHEETS on your bed the night of your first shower and DO NOT SLEEP WITH PETS.    Day of Surgery:  Do not apply any deodorants/lotions.  Please wear clean clothes to the hospital/surgery center.   Remember to brush your teeth WITH YOUR REGULAR TOOTHPASTE.    Please read over the following fact sheets that you were given. Coughing and Deep Breathing, MRSA Information and Surgical Site Infection Prevention

## 2018-10-13 ENCOUNTER — Other Ambulatory Visit (HOSPITAL_COMMUNITY)
Admission: RE | Admit: 2018-10-13 | Discharge: 2018-10-13 | Disposition: A | Discharge status: Home / Self Care | Payer: Medicare Other | Source: Ambulatory Visit | Attending: Neurological Surgery | Admitting: Neurological Surgery

## 2018-10-13 ENCOUNTER — Encounter (HOSPITAL_COMMUNITY)
Admission: RE | Admit: 2018-10-13 | Discharge: 2018-10-13 | Disposition: A | Discharge status: Home / Self Care | Payer: Medicare Other | Source: Ambulatory Visit | Attending: Neurological Surgery | Admitting: Neurological Surgery

## 2018-10-13 ENCOUNTER — Encounter (HOSPITAL_COMMUNITY): Payer: Self-pay

## 2018-10-13 ENCOUNTER — Other Ambulatory Visit: Payer: Self-pay

## 2018-10-13 DIAGNOSIS — Z1159 Encounter for screening for other viral diseases: Secondary | ICD-10-CM | POA: Insufficient documentation

## 2018-10-13 DIAGNOSIS — Z01812 Encounter for preprocedural laboratory examination: Secondary | ICD-10-CM | POA: Diagnosis not present

## 2018-10-13 LAB — BASIC METABOLIC PANEL
Anion gap: 12 (ref 5–15)
BUN: 19 mg/dL (ref 8–23)
CO2: 25 mmol/L (ref 22–32)
Calcium: 9.6 mg/dL (ref 8.9–10.3)
Chloride: 99 mmol/L (ref 98–111)
Creatinine, Ser: 0.8 mg/dL (ref 0.61–1.24)
GFR calc Af Amer: >60 mL/min (ref >60)
GFR calc non Af Amer: >60 mL/min (ref >60)
Glucose, Bld: 84 mg/dL (ref 70–99)
Potassium: 3.4 mmol/L — ABNORMAL LOW (ref 3.5–5.1)
Sodium: 136 mmol/L (ref 135–145)

## 2018-10-13 LAB — CBC
HCT: 40.2 % (ref 39.0–52.0)
Hemoglobin: 13.1 g/dL (ref 13.0–17.0)
MCH: 28.2 pg (ref 26.0–34.0)
MCHC: 32.6 g/dL (ref 30.0–36.0)
MCV: 86.6 fL (ref 80.0–100.0)
Platelets: 313 10*3/uL (ref 150–400)
RBC: 4.64 MIL/uL (ref 4.22–5.81)
RDW: 16.8 % — ABNORMAL HIGH (ref 11.5–15.5)
WBC: 7.2 10*3/uL (ref 4.0–10.5)
nRBC: 0 % (ref 0.0–0.2)

## 2018-10-13 LAB — SURGICAL PCR SCREEN
MRSA, PCR: NEGATIVE
Staphylococcus aureus: NEGATIVE

## 2018-10-13 LAB — GLUCOSE, CAPILLARY: Glucose-Capillary: 79 mg/dL (ref 70–99)

## 2018-10-14 LAB — NOVEL CORONAVIRUS, NAA (HOSP ORDER, SEND-OUT TO REF LAB; TAT 18-24 HRS): SARS-CoV-2, NAA: NOT DETECTED

## 2018-10-14 NOTE — Progress Notes (Signed)
Anesthesia Chart Review:  Case:  [id number] Date/Time:  10/15/18 1238   Procedure:  Re-do Microdiscectomy - left - L4-L5 (Left Back)   Anesthesia type:  General   Pre-op diagnosis:  HNP   Location:  [facility] OR ROOM 18 / [place] [facility]   Surgeon:  Eustace Moore, MD      DISCUSSION: Patient is a 68 year old male scheduled for the above procedure.He is s/p left L4-5 microdiscectomy on 08/01/18 and was discharged home that same day.  History includes former smoker (quit '17), HTN, DM2,PAD (s/p left EIA stent 06/18/18),CVA '12, right carotid bruit '11 ("unremarkable carotid Doppler" by [facility] records), murmur ("Grade 2/6 SEM" per 02/17/18 exam byMitchell, [name], MD), facial cosmetic surgery (not specified) '14, hiatal hernia.   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 PCP classifiedmurmur as 2/6 SEM. No CV symptoms reported at his PAT RN visit.   Patient tolerated similar procedure just a few months ago. A1c 8.9 at that time. Plavix and ASA held on 10/06/17 (Plavix started after 05/2018 left EIA stent).   COVID pre-surgical test in in process. He will get a fasting CBG on the day of surgery. Anesthesiologist to evaluate. If no acute changes then I would anticipate that he can proceed as planned.    VS: BP (!) 155/48   Pulse (!) 58   Temp 36.9 C   Resp 20   Ht 5\' 9"  (1.753 m)   Wt 89.9 kg   SpO2 100%   BMI 29.27 kg/m   PROVIDERS: Alroy Dust, [name], MDis PCP [facility]) - Marcial Pacas, MD is neurologist. Last visit 08/13/18. Monica Martinez, MD is vascular surgeon. Last visit 07/22/18. Given patient had noticed improvement in his walking at [facility] following left EIA stent, Dr. Carlis Abbott favored no further intervention until after back surgery. Three month follow-up planned and would consider possible endarterectomy and/or SFA  intervention if warranted.  - He is not followed by cardiology, but had an ETT by Candee Furbish, MD in 2012.    LABS: Labs reviewed: Acceptable for surgery. A1c 8.9 07/25/18 (down from 10.2 on 05/14/18). Random glucose 84 at PAT.  (all labs ordered are listed, but only abnormal results are displayed)  Labs Reviewed  CBC - Abnormal; Notable for the following components:      Result Value   RDW 16.8 (*)    All other components within normal limits  BASIC METABOLIC PANEL - Abnormal; Notable for the following components:   Potassium 3.4 (*)    All other components within normal limits  SURGICAL PCR SCREEN  GLUCOSE, CAPILLARY    IMAGES: CXR 06/20/18: IMPRESSION: No active cardiopulmonary disease.   EKG:05/14/18: SB at 53 bpm, first degree AV block.   CV: Aortogram with BLE arteriogram 06/18/18: Findings: -Aortogram showed patent bilateral single renal arteries. -His infrarenal aorta was small but patent. -Bilateral common iliac arteries also small but patent. He had greater than 50% stenosis of bilateral hypogastric arteries. His left external iliac artery had a 60 to 70% stenosis in the midportion of the artery well above the femoral head. -Left lower extremity arteriogram revealed calcification in the distal common femoral with a focal stenosis of the proximal profunda and SFA of approximate 50% each. The SFA was patent throughout its course although heavily calcified and diseased and very small and measured less than  3 mm. The above and below-knee popliteal artery as well as the trifurcation was patent. Dominant runoff into the left lower extremity appeared to be through the posterior tibial artery even though the anterior tibial andperoneal was patent and sluggish. -Right lower extremity arteriogram showed a patent common femoral, profunda, SFA as well as above and below-knee popliteal artery patent trifurcation and three-vessel runoff. -All of his vessels  appeared relatively small throughout their course. Intervention: -Left external iliac artery stent placement with angioplasty (7 mm x 40 mm self-expanding Innova that was postdilated with a 6 mm x 40 mm Mustang)  ETT 05/11/11 ([facility]):  Summary:  THR achieved. No symptoms. Baseline EKG showed NSR at 100 bpm with prominent Q waves. During exercise, rare PVC noted. No EKG changes suggestive of ischemia. Recovery uneventful. BP response extremely exaggerated. Pending Dr. Marlou Porch.  - He had carotid U/S 09/18/13 (ordered by [facility]), but report summary cannot be viewed in Epic. Staff at [facility] was also unable to locate report results. However, 02/17/18 office note byMitchell, [name], MDindicates that patient has known right carotid bruit with "unremarkable carotid Doppler."   Past Medical History:  Diagnosis Date  . Arthritis   . BCC (basal cell carcinoma)   . BPH (benign prostatic hyperplasia)   . CAROTID BRUIT, RIGHT 11/30/2009  . DM 11/30/2009  . GERD (gastroesophageal reflux disease)   . Heart murmur   . History of hiatal hernia   . History of Holter monitoring 2009  . History of kidney stones   . HYPERCHOLESTEROLEMIA 11/30/2009  . HYPERTENSION 11/30/2009  . Kidney stones   . Peripheral vascular disease ([place])   . Right carotid bruit   . SMOKER 11/30/2009  . Stroke ([place]) 2012  . URINARY CALCULUS 11/30/2009    Past Surgical History:  Procedure Laterality Date  . ABDOMINAL AORTOGRAM W/LOWER EXTREMITY N/A 06/18/2018   Procedure: ABDOMINAL AORTOGRAM W/LOWER EXTREMITY;  Surgeon: Marty Heck, MD;  Location: [place] [facility];  Service: Cardiovascular;  Laterality: N/A;  . arthroscopy of right shoulder Right    Jan 2019, June 2019-- done at [facility], Dr. Ninfa Linden  . BACK SURGERY    . CATARACT EXTRACTION W/ INTRAOCULAR LENS  IMPLANT, BILATERAL    . CYSTOSCOPY    . FACIAL COSMETIC SURGERY  2014  . FINGER SURGERY  2002  . LUMBAR  LAMINECTOMY/DECOMPRESSION MICRODISCECTOMY Left 08/01/2018   Procedure: Microdiscectomy - Lumbar four-Lumbar five - left;  Surgeon: Eustace Moore, MD;  Location: [place];  Service: Neurosurgery;  Laterality: Left;  . NECK SURGERY  1986  . PERIPHERAL VASCULAR INTERVENTION  06/18/2018   Procedure: PERIPHERAL VASCULAR INTERVENTION;  Surgeon: Marty Heck, MD;  Location: [place] [facility];  Service: Cardiovascular;;  Left external iliac  . ROTATOR CUFF REPAIR Left   . UMBILICAL HERNIA REPAIR      MEDICATIONS: . amLODipine-benazepril (LOTREL) 10-40 MG per capsule  . aspirin (BAYER ASPIRIN) 325 MG tablet  . clopidogrel (PLAVIX) 75 MG tablet  . gabapentin (NEURONTIN) 300 MG capsule  . hydrochlorothiazide (HYDRODIURIL) 25 MG tablet  . HYDROcodone-acetaminophen (NORCO/VICODIN) 5-325 MG tablet  . ibuprofen (ADVIL) 200 MG tablet  . metFORMIN (GLUCOPHAGE-XR) 500 MG 24 hr tablet  . metoprolol succinate (TOPROL-XL) 50 MG 24 hr tablet  . oxyCODONE (OXY IR/ROXICODONE) 5 MG immediate release tablet  . pantoprazole (PROTONIX) 40 MG tablet  . pioglitazone (ACTOS) 45 MG tablet  . repaglinide (PRANDIN) 0.5 MG tablet  . repaglinide (PRANDIN) 2 MG tablet  .  rosuvastatin (CRESTOR) 40 MG tablet  . tiZANidine (ZANAFLEX) 2 MG tablet  . TRESIBA FLEXTOUCH 100 UNIT/ML SOPN FlexTouch Pen   No current facility-administered medications for this encounter.     Myra Gianotti, PA-C Surgical Short Stay/Anesthesiology [facility] Phone [phone] [facility] Phone [phone] 10/14/2018 10:32 AM

## 2018-10-14 NOTE — Anesthesia Preprocedure Evaluation (Addendum)
Anesthesia Evaluation  Patient identified by MRN, date of birth, ID band Patient awake    Reviewed: Allergy & Precautions, NPO status , Patient's Chart, lab work & pertinent test results, reviewed documented beta blocker date and time   Airway Mallampati: II  TM Distance: >3 FB Neck ROM: Full    Dental  (+) Chipped   Pulmonary former smoker,    Pulmonary exam normal breath sounds clear to auscultation       Cardiovascular hypertension, Pt. on medications and Pt. on home beta blockers + Peripheral Vascular Disease  Normal cardiovascular exam+ Valvular Problems/Murmurs  Rhythm:Regular Rate:Normal  Right Carotid Bruit   Neuro/Psych Diabetic peripheral neuropathy TIA Neuromuscular disease CVA    GI/Hepatic Neg liver ROS, hiatal hernia, GERD  Controlled and Medicated,  Endo/Other  diabetes, Well Controlled, Type 2, Oral Hypoglycemic Agents, Insulin DependentHyperlipidemia  Renal/GU Renal diseaseHx/o Renal Calculi  negative genitourinary   Musculoskeletal  (+) Arthritis , Osteoarthritis,  HNP L4-5   Abdominal   Peds  Hematology negative hematology ROS (+)   Anesthesia Other Findings   Reproductive/Obstetrics                            Anesthesia Physical Anesthesia Plan  ASA: III  Anesthesia Plan: General   Post-op Pain Management:    Induction: Intravenous  PONV Risk Score and Plan: 3 and Treatment may vary due to age or medical condition and Dexamethasone  Airway Management Planned: Oral ETT  Additional Equipment:   Intra-op Plan:   Post-operative Plan: Extubation in OR  Informed Consent: I have reviewed the patients History and Physical, chart, labs and discussed the procedure including the risks, benefits and alternatives for the proposed anesthesia with the patient or authorized representative who has indicated his/her understanding and acceptance.     Dental advisory  given  Plan Discussed with: CRNA and Surgeon  Anesthesia Plan Comments: (PAT note written 10/14/2018 by Myra Gianotti, PA-C. )       Anesthesia Quick Evaluation

## 2018-10-15 ENCOUNTER — Encounter (HOSPITAL_COMMUNITY)
Admission: RE | Disposition: A | Discharge status: Home / Self Care | Payer: Self-pay | Source: Home / Self Care | Attending: Neurological Surgery

## 2018-10-15 ENCOUNTER — Other Ambulatory Visit: Payer: Self-pay

## 2018-10-15 ENCOUNTER — Encounter (HOSPITAL_COMMUNITY): Payer: Self-pay | Admitting: Certified Registered"

## 2018-10-15 ENCOUNTER — Ambulatory Visit (HOSPITAL_COMMUNITY): Payer: Medicare Other

## 2018-10-15 ENCOUNTER — Ambulatory Visit (HOSPITAL_COMMUNITY)
Admission: RE | Admit: 2018-10-15 | Discharge: 2018-10-16 | Disposition: A | Discharge status: Home / Self Care | Payer: Medicare Other | Attending: Neurological Surgery | Admitting: Neurological Surgery

## 2018-10-15 ENCOUNTER — Ambulatory Visit (HOSPITAL_COMMUNITY): Payer: Medicare Other | Admitting: Physician Assistant

## 2018-10-15 ENCOUNTER — Ambulatory Visit (HOSPITAL_COMMUNITY): Payer: Medicare Other | Admitting: Certified Registered"

## 2018-10-15 DIAGNOSIS — Z7982 Long term (current) use of aspirin: Secondary | ICD-10-CM | POA: Diagnosis not present

## 2018-10-15 DIAGNOSIS — I10 Essential (primary) hypertension: Secondary | ICD-10-CM | POA: Diagnosis not present

## 2018-10-15 DIAGNOSIS — E1151 Type 2 diabetes mellitus with diabetic peripheral angiopathy without gangrene: Secondary | ICD-10-CM | POA: Diagnosis not present

## 2018-10-15 DIAGNOSIS — Z833 Family history of diabetes mellitus: Secondary | ICD-10-CM | POA: Insufficient documentation

## 2018-10-15 DIAGNOSIS — Z8673 Personal history of transient ischemic attack (TIA), and cerebral infarction without residual deficits: Secondary | ICD-10-CM | POA: Diagnosis not present

## 2018-10-15 DIAGNOSIS — M1990 Unspecified osteoarthritis, unspecified site: Secondary | ICD-10-CM | POA: Insufficient documentation

## 2018-10-15 DIAGNOSIS — Z885 Allergy status to narcotic agent status: Secondary | ICD-10-CM | POA: Diagnosis not present

## 2018-10-15 DIAGNOSIS — Z85828 Personal history of other malignant neoplasm of skin: Secondary | ICD-10-CM | POA: Insufficient documentation

## 2018-10-15 DIAGNOSIS — Z794 Long term (current) use of insulin: Secondary | ICD-10-CM | POA: Insufficient documentation

## 2018-10-15 DIAGNOSIS — Z87442 Personal history of urinary calculi: Secondary | ICD-10-CM | POA: Insufficient documentation

## 2018-10-15 DIAGNOSIS — K219 Gastro-esophageal reflux disease without esophagitis: Secondary | ICD-10-CM | POA: Diagnosis not present

## 2018-10-15 DIAGNOSIS — K449 Diaphragmatic hernia without obstruction or gangrene: Secondary | ICD-10-CM | POA: Diagnosis not present

## 2018-10-15 DIAGNOSIS — Z79899 Other long term (current) drug therapy: Secondary | ICD-10-CM | POA: Diagnosis not present

## 2018-10-15 DIAGNOSIS — M5116 Intervertebral disc disorders with radiculopathy, lumbar region: Secondary | ICD-10-CM | POA: Insufficient documentation

## 2018-10-15 DIAGNOSIS — Z9889 Other specified postprocedural states: Secondary | ICD-10-CM

## 2018-10-15 DIAGNOSIS — Z7902 Long term (current) use of antithrombotics/antiplatelets: Secondary | ICD-10-CM | POA: Insufficient documentation

## 2018-10-15 DIAGNOSIS — E7800 Pure hypercholesterolemia, unspecified: Secondary | ICD-10-CM | POA: Insufficient documentation

## 2018-10-15 DIAGNOSIS — Z87891 Personal history of nicotine dependence: Secondary | ICD-10-CM | POA: Diagnosis not present

## 2018-10-15 DIAGNOSIS — M5126 Other intervertebral disc displacement, lumbar region: Secondary | ICD-10-CM | POA: Insufficient documentation

## 2018-10-15 DIAGNOSIS — Z419 Encounter for procedure for purposes other than remedying health state, unspecified: Secondary | ICD-10-CM

## 2018-10-15 HISTORY — PX: LUMBAR LAMINECTOMY/DECOMPRESSION MICRODISCECTOMY: SHX5026

## 2018-10-15 HISTORY — DX: Other intervertebral disc displacement, lumbar region: M51.26

## 2018-10-15 LAB — CBC WITH DIFFERENTIAL/PLATELET
Abs Immature Granulocytes: 0.02 10*3/uL (ref 0.00–0.07)
Basophils Absolute: 0 10*3/uL (ref 0.0–0.1)
Basophils Relative: 1 %
Eosinophils Absolute: 0.1 10*3/uL (ref 0.0–0.5)
Eosinophils Relative: 1 %
HCT: 39.7 % (ref 39.0–52.0)
Hemoglobin: 12.8 g/dL — ABNORMAL LOW (ref 13.0–17.0)
Immature Granulocytes: 0 %
Lymphocytes Relative: 29 %
Lymphs Abs: 2.3 10*3/uL (ref 0.7–4.0)
MCH: 28.1 pg (ref 26.0–34.0)
MCHC: 32.2 g/dL (ref 30.0–36.0)
MCV: 87.3 fL (ref 80.0–100.0)
Monocytes Absolute: 0.6 10*3/uL (ref 0.1–1.0)
Monocytes Relative: 8 %
Neutro Abs: 4.7 10*3/uL (ref 1.7–7.7)
Neutrophils Relative %: 61 %
Platelets: 306 10*3/uL (ref 150–400)
RBC: 4.55 MIL/uL (ref 4.22–5.81)
RDW: 16.9 % — ABNORMAL HIGH (ref 11.5–15.5)
WBC: 7.8 10*3/uL (ref 4.0–10.5)
nRBC: 0 % (ref 0.0–0.2)

## 2018-10-15 LAB — GLUCOSE, CAPILLARY
Glucose-Capillary: 108 mg/dL — ABNORMAL HIGH (ref 70–99)
Glucose-Capillary: 97 mg/dL (ref 70–99)
Glucose-Capillary: 72 mg/dL (ref 70–99)
Glucose-Capillary: 112 mg/dL — ABNORMAL HIGH (ref 70–99)
Glucose-Capillary: 310 mg/dL — ABNORMAL HIGH (ref 70–99)

## 2018-10-15 LAB — PROTIME-INR
INR: 1.1 (ref 0.8–1.2)
Prothrombin Time: 13.9 seconds (ref 11.4–15.2)

## 2018-10-15 SURGERY — LUMBAR LAMINECTOMY/DECOMPRESSION MICRODISCECTOMY 1 LEVEL
Anesthesia: General | Site: Back | Laterality: Left

## 2018-10-15 MED ORDER — ONDANSETRON HCL 4 MG/2ML IJ SOLN
INTRAMUSCULAR | Status: DC | PRN
  Administered 2018-10-15: 4 mg via INTRAVENOUS

## 2018-10-15 MED ORDER — CEFAZOLIN SODIUM-DEXTROSE 2-4 GM/100ML-% IV SOLN
2.0000 g | Freq: Three times a day (TID) | INTRAVENOUS | Status: AC
  Administered 2018-10-15 – 2018-10-16 (×2): 2 g via INTRAVENOUS
  Filled 2018-10-15 (×3): qty 100

## 2018-10-15 MED ORDER — PROPOFOL 10 MG/ML IV BOLUS
INTRAVENOUS | Status: DC | PRN
  Administered 2018-10-15: 140 mg via INTRAVENOUS

## 2018-10-15 MED ORDER — MIDAZOLAM HCL 2 MG/2ML IJ SOLN
INTRAMUSCULAR | Status: AC
  Filled 2018-10-15: qty 2

## 2018-10-15 MED ORDER — MIDAZOLAM HCL 5 MG/5ML IJ SOLN
INTRAMUSCULAR | Status: DC | PRN
  Administered 2018-10-15: 2 mg via INTRAVENOUS

## 2018-10-15 MED ORDER — CHLORHEXIDINE GLUCONATE CLOTH 2 % EX PADS: 6.0000 | MEDICATED_PAD | Freq: Once | CUTANEOUS | Status: DC

## 2018-10-15 MED ORDER — SODIUM CHLORIDE 0.9% FLUSH: 3.0000 mL | INTRAVENOUS | Status: DC | PRN

## 2018-10-15 MED ORDER — HYDROCODONE-ACETAMINOPHEN 5-325 MG PO TABS
1.0000 | ORAL_TABLET | Freq: Four times a day (QID) | ORAL | Status: DC | PRN
  Administered 2018-10-15: 1 via ORAL

## 2018-10-15 MED ORDER — AMLODIPINE BESYLATE 10 MG PO TABS
10.0000 mg | ORAL_TABLET | Freq: Every day | ORAL | Status: DC
  Administered 2018-10-15 – 2018-10-16 (×2): 10 mg via ORAL
  Filled 2018-10-15 (×2): qty 1

## 2018-10-15 MED ORDER — CELECOXIB 200 MG PO CAPS
200.0000 mg | ORAL_CAPSULE | Freq: Two times a day (BID) | ORAL | Status: DC
  Administered 2018-10-15 – 2018-10-16 (×2): 200 mg via ORAL
  Filled 2018-10-15 (×2): qty 1

## 2018-10-15 MED ORDER — BUPIVACAINE HCL (PF) 0.25 % IJ SOLN
INTRAMUSCULAR | Status: DC | PRN
  Administered 2018-10-15: 10 mL

## 2018-10-15 MED ORDER — SUGAMMADEX SODIUM 200 MG/2ML IV SOLN
INTRAVENOUS | Status: DC | PRN
  Administered 2018-10-15: 200 mg via INTRAVENOUS

## 2018-10-15 MED ORDER — CEFAZOLIN SODIUM-DEXTROSE 2-4 GM/100ML-% IV SOLN
INTRAVENOUS | Status: AC
  Filled 2018-10-15: qty 100

## 2018-10-15 MED ORDER — REPAGLINIDE 2 MG PO TABS
2.0000 mg | ORAL_TABLET | Freq: Three times a day (TID) | ORAL | Status: DC
  Administered 2018-10-15: 2 mg via ORAL
  Filled 2018-10-15 (×5): qty 1

## 2018-10-15 MED ORDER — AMLODIPINE BESY-BENAZEPRIL HCL 10-40 MG PO CAPS: 1.0000 | ORAL_CAPSULE | Freq: Every day | ORAL | Status: DC

## 2018-10-15 MED ORDER — HYDROCHLOROTHIAZIDE 25 MG PO TABS
25.0000 mg | ORAL_TABLET | Freq: Every day | ORAL | Status: DC
  Administered 2018-10-15 – 2018-10-16 (×2): 25 mg via ORAL
  Filled 2018-10-15 (×2): qty 1

## 2018-10-15 MED ORDER — ONDANSETRON HCL 4 MG/2ML IJ SOLN: 4.0000 mg | Freq: Four times a day (QID) | INTRAMUSCULAR | Status: DC | PRN

## 2018-10-15 MED ORDER — SODIUM CHLORIDE 0.9 % IV SOLN: 250.0000 mL | INTRAVENOUS | Status: DC

## 2018-10-15 MED ORDER — ONDANSETRON HCL 4 MG PO TABS: 4.0000 mg | ORAL_TABLET | Freq: Four times a day (QID) | ORAL | Status: DC | PRN

## 2018-10-15 MED ORDER — SENNA 8.6 MG PO TABS
1.0000 | ORAL_TABLET | Freq: Two times a day (BID) | ORAL | Status: DC
  Administered 2018-10-15 – 2018-10-16 (×2): 8.6 mg via ORAL
  Filled 2018-10-15 (×2): qty 1

## 2018-10-15 MED ORDER — METFORMIN HCL ER 500 MG PO TB24
1000.0000 mg | ORAL_TABLET | Freq: Two times a day (BID) | ORAL | Status: DC
  Administered 2018-10-15 – 2018-10-16 (×2): 1000 mg via ORAL
  Filled 2018-10-15 (×2): qty 2

## 2018-10-15 MED ORDER — DEXAMETHASONE SODIUM PHOSPHATE 10 MG/ML IJ SOLN
INTRAMUSCULAR | Status: AC
  Filled 2018-10-15: qty 1

## 2018-10-15 MED ORDER — SUFENTANIL CITRATE 50 MCG/ML IV SOLN
INTRAVENOUS | Status: DC | PRN
  Administered 2018-10-15: 20 ug via INTRAVENOUS

## 2018-10-15 MED ORDER — ACETAMINOPHEN 325 MG PO TABS
650.0000 mg | ORAL_TABLET | ORAL | Status: DC | PRN
  Administered 2018-10-16: 650 mg via ORAL
  Filled 2018-10-15: qty 2

## 2018-10-15 MED ORDER — LIDOCAINE 2% (20 MG/ML) 5 ML SYRINGE
INTRAMUSCULAR | Status: AC
  Filled 2018-10-15: qty 5

## 2018-10-15 MED ORDER — THROMBIN 5000 UNITS EX SOLR
CUTANEOUS | Status: AC
  Filled 2018-10-15: qty 10000

## 2018-10-15 MED ORDER — BENAZEPRIL HCL 20 MG PO TABS
40.0000 mg | ORAL_TABLET | Freq: Every day | ORAL | Status: DC
  Administered 2018-10-15 – 2018-10-16 (×2): 40 mg via ORAL
  Filled 2018-10-15 (×2): qty 2

## 2018-10-15 MED ORDER — SODIUM CHLORIDE 0.9% FLUSH: 3.0000 mL | Freq: Two times a day (BID) | INTRAVENOUS | Status: DC

## 2018-10-15 MED ORDER — FENTANYL CITRATE (PF) 100 MCG/2ML IJ SOLN
25.0000 ug | INTRAMUSCULAR | Status: DC | PRN
  Administered 2018-10-15: 25 ug via INTRAVENOUS

## 2018-10-15 MED ORDER — DEXAMETHASONE SODIUM PHOSPHATE 10 MG/ML IJ SOLN
10.0000 mg | INTRAMUSCULAR | Status: AC
  Administered 2018-10-15: 14:00:00 10 mg via INTRAVENOUS

## 2018-10-15 MED ORDER — 0.9 % SODIUM CHLORIDE (POUR BTL) OPTIME
TOPICAL | Status: DC | PRN
  Administered 2018-10-15: 1000 mL

## 2018-10-15 MED ORDER — GABAPENTIN 300 MG PO CAPS
300.0000 mg | ORAL_CAPSULE | Freq: Three times a day (TID) | ORAL | Status: DC
  Administered 2018-10-15 – 2018-10-16 (×3): 300 mg via ORAL
  Filled 2018-10-15 (×3): qty 1

## 2018-10-15 MED ORDER — LACTATED RINGERS IV SOLN
INTRAVENOUS | Status: DC
  Administered 2018-10-15: 12:00:00 via INTRAVENOUS

## 2018-10-15 MED ORDER — ONDANSETRON HCL 4 MG/2ML IJ SOLN: 4.0000 mg | Freq: Once | INTRAMUSCULAR | Status: DC | PRN

## 2018-10-15 MED ORDER — CEFAZOLIN SODIUM-DEXTROSE 2-4 GM/100ML-% IV SOLN
2.0000 g | INTRAVENOUS | Status: AC
  Administered 2018-10-15: 2 g via INTRAVENOUS

## 2018-10-15 MED ORDER — MENTHOL 3 MG MT LOZG: 1.0000 | LOZENGE | OROMUCOSAL | Status: DC | PRN

## 2018-10-15 MED ORDER — FENTANYL CITRATE (PF) 100 MCG/2ML IJ SOLN
INTRAMUSCULAR | Status: AC
  Filled 2018-10-15: qty 2

## 2018-10-15 MED ORDER — SODIUM CHLORIDE 0.9 % IV SOLN
INTRAVENOUS | Status: DC | PRN
  Administered 2018-10-15: 14:00:00 500 mL

## 2018-10-15 MED ORDER — ACETAMINOPHEN 650 MG RE SUPP: 650.0000 mg | RECTAL | Status: DC | PRN

## 2018-10-15 MED ORDER — SUFENTANIL CITRATE 50 MCG/ML IV SOLN
INTRAVENOUS | Status: AC
  Filled 2018-10-15: qty 1

## 2018-10-15 MED ORDER — ONDANSETRON HCL 4 MG/2ML IJ SOLN
INTRAMUSCULAR | Status: AC
  Filled 2018-10-15: qty 2

## 2018-10-15 MED ORDER — INSULIN ASPART 100 UNIT/ML ~~LOC~~ SOLN
0.0000 [IU] | Freq: Three times a day (TID) | SUBCUTANEOUS | Status: DC
  Administered 2018-10-16: 3 [IU] via SUBCUTANEOUS

## 2018-10-15 MED ORDER — ASPIRIN 325 MG PO TABS
325.0000 mg | ORAL_TABLET | Freq: Every day | ORAL | Status: DC
  Administered 2018-10-15 – 2018-10-16 (×2): 325 mg via ORAL
  Filled 2018-10-15 (×2): qty 1

## 2018-10-15 MED ORDER — POTASSIUM CHLORIDE IN NACL 20-0.9 MEQ/L-% IV SOLN
INTRAVENOUS | Status: DC
  Administered 2018-10-15: 17:00:00 via INTRAVENOUS
  Filled 2018-10-15: qty 1000

## 2018-10-15 MED ORDER — ROCURONIUM BROMIDE 10 MG/ML (PF) SYRINGE
PREFILLED_SYRINGE | INTRAVENOUS | Status: DC | PRN
  Administered 2018-10-15: 50 mg via INTRAVENOUS

## 2018-10-15 MED ORDER — HEMOSTATIC AGENTS (NO CHARGE) OPTIME
TOPICAL | Status: DC | PRN
  Administered 2018-10-15: 1 via TOPICAL

## 2018-10-15 MED ORDER — BUPIVACAINE HCL (PF) 0.25 % IJ SOLN
INTRAMUSCULAR | Status: AC
  Filled 2018-10-15: qty 30

## 2018-10-15 MED ORDER — KETOROLAC TROMETHAMINE 30 MG/ML IJ SOLN
INTRAMUSCULAR | Status: DC | PRN
  Administered 2018-10-15: 30 mg via INTRAVENOUS

## 2018-10-15 MED ORDER — PROPOFOL 10 MG/ML IV BOLUS
INTRAVENOUS | Status: AC
  Filled 2018-10-15: qty 20

## 2018-10-15 MED ORDER — REPAGLINIDE 1 MG PO TABS
1.0000 mg | ORAL_TABLET | Freq: Three times a day (TID) | ORAL | Status: DC
  Administered 2018-10-15 – 2018-10-16 (×2): 1 mg via ORAL
  Filled 2018-10-15 (×3): qty 1

## 2018-10-15 MED ORDER — OXYCODONE-ACETAMINOPHEN 5-325 MG PO TABS
1.0000 | ORAL_TABLET | ORAL | Status: DC | PRN
  Administered 2018-10-15: 1 via ORAL
  Filled 2018-10-15: qty 1

## 2018-10-15 MED ORDER — PIOGLITAZONE HCL 45 MG PO TABS
45.0000 mg | ORAL_TABLET | Freq: Every day | ORAL | Status: DC
  Administered 2018-10-16: 45 mg via ORAL
  Filled 2018-10-15: qty 1

## 2018-10-15 MED ORDER — ROCURONIUM BROMIDE 10 MG/ML (PF) SYRINGE
PREFILLED_SYRINGE | INTRAVENOUS | Status: AC
  Filled 2018-10-15: qty 10

## 2018-10-15 MED ORDER — METOPROLOL SUCCINATE ER 50 MG PO TB24
50.0000 mg | ORAL_TABLET | Freq: Every day | ORAL | Status: DC
  Administered 2018-10-16: 50 mg via ORAL
  Filled 2018-10-15: qty 1

## 2018-10-15 MED ORDER — PANTOPRAZOLE SODIUM 40 MG PO TBEC
40.0000 mg | DELAYED_RELEASE_TABLET | Freq: Every day | ORAL | Status: DC
  Administered 2018-10-16: 40 mg via ORAL
  Filled 2018-10-15: qty 1

## 2018-10-15 MED ORDER — LIDOCAINE 2% (20 MG/ML) 5 ML SYRINGE
INTRAMUSCULAR | Status: DC | PRN
  Administered 2018-10-15: 60 mg via INTRAVENOUS

## 2018-10-15 MED ORDER — PHENOL 1.4 % MT LIQD: 1.0000 | OROMUCOSAL | Status: DC | PRN

## 2018-10-15 MED ORDER — THROMBIN 5000 UNITS EX SOLR
CUTANEOUS | Status: DC | PRN
  Administered 2018-10-15 (×2): 5000 [IU] via TOPICAL

## 2018-10-15 MED ORDER — HYDROCODONE-ACETAMINOPHEN 5-325 MG PO TABS
ORAL_TABLET | ORAL | Status: AC
  Filled 2018-10-15: qty 1

## 2018-10-15 SURGICAL SUPPLY — 49 items
BAG DECANTER FOR FLEXI CONT (MISCELLANEOUS) ×2 IMPLANT
BENZOIN TINCTURE PRP APPL 2/3 (GAUZE/BANDAGES/DRESSINGS) ×2 IMPLANT
BUR MATCHSTICK NEURO 3.0 LAGG (BURR) ×2 IMPLANT
CANISTER SUCT 3000ML PPV (MISCELLANEOUS) ×2 IMPLANT
CARTRIDGE OIL MAESTRO DRILL (MISCELLANEOUS) ×1 IMPLANT
CLSR STERI-STRIP ANTIMIC 1/2X4 (GAUZE/BANDAGES/DRESSINGS) ×2 IMPLANT
COVER WAND RF STERILE (DRAPES) ×2 IMPLANT
DERMABOND ADVANCED (GAUZE/BANDAGES/DRESSINGS) ×1
DERMABOND ADVANCED .7 DNX12 (GAUZE/BANDAGES/DRESSINGS) ×1 IMPLANT
DIFFUSER DRILL AIR PNEUMATIC (MISCELLANEOUS) ×2 IMPLANT
DRAPE LAPAROTOMY 100X72X124 (DRAPES) ×2 IMPLANT
DRAPE MICROSCOPE LEICA (MISCELLANEOUS) ×2 IMPLANT
DRAPE POUCH INSTRU U-SHP 10X18 (DRAPES) ×2 IMPLANT
DRAPE SURG 17X23 STRL (DRAPES) ×2 IMPLANT
DRSG OPSITE POSTOP 3X4 (GAUZE/BANDAGES/DRESSINGS) ×2 IMPLANT
DRSG OPSITE POSTOP 4X6 (GAUZE/BANDAGES/DRESSINGS) ×2 IMPLANT
DURAPREP 26ML APPLICATOR (WOUND CARE) ×2 IMPLANT
ELECT REM PT RETURN 9FT ADLT (ELECTROSURGICAL) ×2
ELECTRODE REM PT RTRN 9FT ADLT (ELECTROSURGICAL) ×1 IMPLANT
GAUZE 4X4 16PLY RFD (DISPOSABLE) IMPLANT
GLOVE BIO SURGEON STRL SZ7 (GLOVE) ×4 IMPLANT
GLOVE BIO SURGEON STRL SZ8 (GLOVE) ×4 IMPLANT
GLOVE BIOGEL PI IND STRL 7.0 (GLOVE) ×5 IMPLANT
GLOVE BIOGEL PI IND STRL 7.5 (GLOVE) ×4 IMPLANT
GLOVE BIOGEL PI INDICATOR 7.0 (GLOVE) ×5
GLOVE BIOGEL PI INDICATOR 7.5 (GLOVE) ×4
GOWN STRL REUS W/ TWL LRG LVL3 (GOWN DISPOSABLE) ×1 IMPLANT
GOWN STRL REUS W/ TWL XL LVL3 (GOWN DISPOSABLE) ×2 IMPLANT
GOWN STRL REUS W/TWL 2XL LVL3 (GOWN DISPOSABLE) IMPLANT
GOWN STRL REUS W/TWL LRG LVL3 (GOWN DISPOSABLE) ×1
GOWN STRL REUS W/TWL XL LVL3 (GOWN DISPOSABLE) ×2
KIT BASIN OR (CUSTOM PROCEDURE TRAY) ×2 IMPLANT
KIT TURNOVER KIT B (KITS) ×2 IMPLANT
NEEDLE HYPO 25X1 1.5 SAFETY (NEEDLE) ×2 IMPLANT
NEEDLE SPNL 20GX3.5 QUINCKE YW (NEEDLE) ×2 IMPLANT
NS IRRIG 1000ML POUR BTL (IV SOLUTION) ×2 IMPLANT
OIL CARTRIDGE MAESTRO DRILL (MISCELLANEOUS) ×2
PACK LAMINECTOMY NEURO (CUSTOM PROCEDURE TRAY) ×2 IMPLANT
PAD ARMBOARD 7.5X6 YLW CONV (MISCELLANEOUS) ×6 IMPLANT
RUBBERBAND STERILE (MISCELLANEOUS) ×4 IMPLANT
SPONGE SURGIFOAM ABS GEL SZ50 (HEMOSTASIS) ×2 IMPLANT
STRIP CLOSURE SKIN 1/2X4 (GAUZE/BANDAGES/DRESSINGS) ×2 IMPLANT
SUT VIC AB 0 CT1 18XCR BRD8 (SUTURE) ×1 IMPLANT
SUT VIC AB 0 CT1 8-18 (SUTURE) ×1
SUT VIC AB 2-0 CP2 18 (SUTURE) ×2 IMPLANT
SUT VIC AB 3-0 SH 8-18 (SUTURE) ×2 IMPLANT
TOWEL GREEN STERILE (TOWEL DISPOSABLE) ×2 IMPLANT
TOWEL GREEN STERILE FF (TOWEL DISPOSABLE) ×2 IMPLANT
WATER STERILE IRR 1000ML POUR (IV SOLUTION) ×2 IMPLANT

## 2018-10-15 NOTE — Progress Notes (Signed)
Pt stated that hydrocodone is ineffective in relieving his pain, and requested oxycodone instead. This nurse asked pt about codeine allergy recorded in his chart. He stated that he had a reaction 35 years ago consisting of vomiting and a rash, but has taken oxycodone since for surgical pain with no adverse effects. This nurse called after-hours service for Dr. Sherley Bounds; received call back from Cooper Render, NP verbally authorizing oxycodone 5/325 q4h for moderate pain. Will administer, and will monitor pt for any adverse effects.

## 2018-10-15 NOTE — Anesthesia Postprocedure Evaluation (Signed)
Anesthesia Post Note  Patient: Luis Lindsey  Procedure(s) Performed: Re-do Microdiscectomy - left - Lumbar four-Lumbar five (Left Back)     Patient location during evaluation: PACU Anesthesia Type: General Level of consciousness: awake and alert and oriented Pain management: pain level controlled Vital Signs Assessment: post-procedure vital signs reviewed and stable Respiratory status: spontaneous breathing, nonlabored ventilation, respiratory function stable and patient connected to nasal cannula oxygen Cardiovascular status: blood pressure returned to baseline and stable Postop Assessment: no apparent nausea or vomiting Anesthetic complications: no    Last Vitals:  Vitals:   10/15/18 1530 10/15/18 1540  BP: (!) 152/86 (!) 150/74  Pulse: 62 61  Resp: 18 18  Temp:    SpO2: 100% 99%    Last Pain:  Vitals:   10/15/18 1530  TempSrc:   PainSc: 4                  Moe Graca A.

## 2018-10-15 NOTE — Op Note (Signed)
10/15/2018  2:46 PM  PATIENT:  Luis Lindsey  68 y.o. 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 Imaging showed recurrent left L4-5 disc herniation. The patient tried conservative measures without relief. Pain was debilitating. Recommended redo left L4-5 microdiscectomy. Patient understood the risks, benefits, and alternatives and potential outcomes and wished to proceed.  PROCEDURE DETAILS: The patient was taken to the operating room and after induction of adequate generalized endotracheal anesthesia, the patient was rolled into the prone position on the Wilson frame and all pressure points were padded. The lumbar region was cleaned and then prepped with DuraPrep and draped in the usual sterile fashion. 5 cc of local anesthesia was injected and then a dorsal midline incision was made and carried down to the lumbo sacral fascia. The fascia was opened and the paraspinous musculature was taken down in a subperiosteal fashion to expose L4 5 on the left. Intraoperative x-ray confirmed my level, and then I used a combination of the high-speed drill and the Kerrison punches to perform a re-do hemilaminectomy, medial facetectomy, and foraminotomy at L4 5 on the left. The underlying yellow ligament was opened and removed in a piecemeal fashion to expose the underlying dura and exiting nerve root. I undercut the lateral recess and dissected down until I was medial  to and distal to the pedicle. The nerve root was well decompressed. We then gently retracted the nerve root medially with a retractor, coagulated the epidural venous vasculature, and incised the disc space. I performed a thorough intradiscal discectomy with pituitary rongeurs and curettes, until I had a nice decompression of the nerve root and the midline. I then palpated with a coronary dilator along the nerve root and into the foramen to assure adequate decompression. I felt no more compression of the nerve root. I irrigated with saline solution containing bacitracin. Achieved hemostasis with bipolar cautery, lined the dura with Gelfoam, and then closed the fascia with 0 Vicryl. I closed the subcutaneous tissues with 2-0 Vicryl and the subcuticular tissues with 3-0 Vicryl. The skin was then closed with benzoin and Steri-Strips. The drapes were removed, a sterile dressing was applied. The patient was awakened from general anesthesia and transferred to the recovery room in stable condition. At the end of the procedure all sponge, needle and instrument counts were correct.    PLAN OF CARE: Admit for overnight observation  PATIENT DISPOSITION:  PACU - hemodynamically stable.   Delay start of Pharmacological VTE agent (>24hrs) due to surgical blood loss or risk of bleeding:  yes

## 2018-10-15 NOTE — Transfer of Care (Signed)
Immediate Anesthesia Transfer of Care Note  Patient: Luis Lindsey  Procedure(s) Performed: Re-do Microdiscectomy - left - Lumbar four-Lumbar five (Left Back)  Patient Location: PACU  Anesthesia Type:General  Level of Consciousness: awake, alert , oriented and patient cooperative  Airway & Oxygen Therapy: Patient Spontanous Breathing and Patient connected to nasal cannula oxygen  Post-op Assessment: Report given to RN, Post -op Vital signs reviewed and stable and Patient moving all extremities  Post vital signs: Reviewed and stable  Last Vitals:  Vitals Value Taken Time  BP 154/64 10/15/2018  2:55 PM  Temp    Pulse 62 10/15/2018  2:57 PM  Resp 16 10/15/2018  2:57 PM  SpO2 99 % 10/15/2018  2:57 PM  Vitals shown include unvalidated device data.  Last Pain:  Vitals:   10/15/18 1151  TempSrc:   PainSc: 0-No pain         Complications: No apparent anesthesia complications

## 2018-10-15 NOTE — Anesthesia Procedure Notes (Signed)
Procedure Name: Intubation Date/Time: 10/15/2018 1:17 PM Performed by: Moshe Salisbury, CRNA Pre-anesthesia Checklist: Patient identified, Emergency Drugs available, Suction available and Patient being monitored Patient Re-evaluated:Patient Re-evaluated prior to induction Oxygen Delivery Method: Circle System Utilized Preoxygenation: Pre-oxygenation with 100% oxygen Induction Type: IV induction Ventilation: Mask ventilation without difficulty Laryngoscope Size: Mac and 4 Grade View: Grade II Tube type: Oral Tube size: 8.0 mm Number of attempts: 1 Airway Equipment and Method: Stylet Placement Confirmation: ETT inserted through vocal cords under direct vision,  positive ETCO2 and breath sounds checked- equal and bilateral Secured at: 22 cm Tube secured with: Tape Dental Injury: Teeth and Oropharynx as per pre-operative assessment

## 2018-10-15 NOTE — Progress Notes (Signed)
Spoke with the surgery scheduler for Dr. Ronnald Ramp regarding the orders not being signed. She will relay to the doctor.

## 2018-10-15 NOTE — Plan of Care (Signed)
  Problem: Education: Goal: Ability to verbalize activity precautions or restrictions will improve Outcome: Progressing   Problem: Activity: Goal: Ability to avoid complications of mobility impairment will improve Outcome: Progressing Goal: Will remain free from falls Outcome: Progressing   Problem: Bowel/Gastric: Goal: Gastrointestinal status for postoperative course will improve Outcome: Progressing   Problem: Clinical Measurements: Goal: Ability to maintain clinical measurements within normal limits will improve Outcome: Progressing   Problem: Pain Management: Goal: Pain level will decrease Outcome: Progressing

## 2018-10-15 NOTE — H&P (Signed)
Subjective: Patient is a 68 y.o. male admitted for recurrent HNP Lumbar. Onset of symptoms was several weeks ago, gradually worsening since that time.  The pain is rated severe, and is located at the across the lower back and radiates to leg. The pain is described as aching and occurs all day. The symptoms have been progressive. Symptoms are exacerbated by nothing in particular. 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 Arthritis   . BCC (basal cell carcinoma)   . BPH (benign prostatic hyperplasia)   . CAROTID BRUIT, RIGHT 11/30/2009  . DM 11/30/2009  . GERD (gastroesophageal reflux disease)   . Heart murmur   . History of hiatal hernia   . History of Holter monitoring 2009  . History of kidney stones   . HNP (herniated nucleus pulposus), lumbar    L4-5  . HYPERCHOLESTEROLEMIA 11/30/2009  . HYPERTENSION 11/30/2009  . Kidney stones   . Peripheral vascular disease ([place])   . Right carotid bruit   . SMOKER 11/30/2009  . Stroke ([place]) 2012  . URINARY CALCULUS 11/30/2009    Past Surgical History:  Procedure Laterality Date  . ABDOMINAL AORTOGRAM W/LOWER EXTREMITY N/A 06/18/2018   Procedure: ABDOMINAL AORTOGRAM W/LOWER EXTREMITY;  Surgeon: Marty Heck, MD;  Location: [facility];  Service: Cardiovascular;  Laterality: N/A;  . arthroscopy of right shoulder Right    Jan 2019, June 2019-- done at [facility], Dr. Ninfa Linden  . BACK SURGERY    . CATARACT EXTRACTION W/ INTRAOCULAR LENS  IMPLANT, BILATERAL    . CYSTOSCOPY    . FACIAL COSMETIC SURGERY  2014  . FINGER SURGERY  2002  . LUMBAR LAMINECTOMY/DECOMPRESSION MICRODISCECTOMY Left 08/01/2018   Procedure: Microdiscectomy - Lumbar four-Lumbar five - left;  Surgeon: Eustace Moore, MD;  Location: [facility];  Service: Neurosurgery;  Laterality: Left;  . NECK SURGERY  1986  . PERIPHERAL VASCULAR INTERVENTION  06/18/2018   Procedure: PERIPHERAL VASCULAR INTERVENTION;  Surgeon: Marty Heck, MD;  Location: [facility];  Service: Cardiovascular;;  Left external iliac  . ROTATOR CUFF REPAIR Left   . UMBILICAL HERNIA REPAIR      Prior to Admission medications   Medication Sig Start Date End Date Taking? Authorizing Provider  amLODipine-benazepril (LOTREL) 10-40 MG per capsule Take 1 capsule by mouth daily.     Yes [provider]  gabapentin (NEURONTIN) 300 MG capsule TAKE 1 CAPSULE BY MOUTH THREE TIMES A DAY Patient taking differently: Take 300 mg by mouth 3 (three) times daily.  08/25/18  Yes Marcial Pacas, MD  hydrochlorothiazide (HYDRODIURIL) 25 MG tablet Take 25 mg by mouth daily.     Yes [provider]  metFORMIN (GLUCOPHAGE-XR) 500 MG 24 hr tablet Take 1,000 mg by mouth 2 (two) times daily. 08/20/18  Yes [provider]  metoprolol succinate (TOPROL-XL) 50 MG 24 hr tablet Take 50 mg by mouth daily. Take with or immediately following a meal.   Yes [provider]  pantoprazole (PROTONIX) 40 MG tablet Take 40 mg by mouth daily with breakfast.  07/02/18  Yes [provider]  pioglitazone (ACTOS) 45 MG tablet TAKE 1 TABLET DAILY (NEED APPOINTMENT FOR FURTHER REFILLS) Patient taking differently: Take 45 mg by mouth daily.  04/28/14  Yes Renato Shin, MD  repaglinide (PRANDIN) 0.5 MG tablet Take 1 mg by mouth 3 (three) times daily before meals.  02/21/18  Yes [provider]  repaglinide (PRANDIN) 2 MG tablet Take 2 mg by mouth 3 (three) times daily before meals.  09/17/18  Yes [provider]  rosuvastatin (CRESTOR) 40 MG tablet Take 40 mg by mouth daily.    Yes [provider]  TRESIBA FLEXTOUCH 100 UNIT/ML SOPN FlexTouch Pen Inject 60 Units into the skin daily.  02/11/17  Yes [provider]  aspirin (BAYER ASPIRIN) 325 MG tablet Take 1 tablet (325 mg total) by mouth daily. 12/26/12   Drema Dallas, DO  clopidogrel (PLAVIX) 75 MG tablet Take 75 mg by mouth daily with breakfast. 09/13/18    [provider]  HYDROcodone-acetaminophen (NORCO/VICODIN) 5-325 MG tablet Take 1-2 tablets by mouth every 6 (six) hours as needed. for pain 08/14/18   [provider]  ibuprofen (ADVIL) 200 MG tablet Take 400 mg by mouth every 8 (eight) hours as needed for mild pain.    [provider]  oxyCODONE (OXY IR/ROXICODONE) 5 MG immediate release tablet Take 1 tablet (5 mg total) by mouth every 4 (four) hours as needed for moderate pain ((score 4 to 6)). Patient not taking: Reported on 10/10/2018 08/01/18   Eustace Moore, MD  tiZANidine (ZANAFLEX) 2 MG tablet Take 1 tablet (2 mg total) by mouth every 6 (six) hours as needed. Patient not taking: Reported on 10/10/2018 08/01/18   Eustace Moore, MD   Allergies  Allergen Reactions  . Codeine Nausea Only    Social History   Tobacco Use  . Smoking status: Former Smoker    Years: 40.00    Types: Cigarettes    Last attempt to quit: 12/2015    Years since quitting: 2.8  . Smokeless tobacco: Never Used  . Tobacco comment: Quit 12-2012  Substance Use Topics  . Alcohol use: No    Family History  Problem Relation Age of Onset  . Diabetes Sister   . Diabetes Mellitus I Sister   . Stroke Father   . Heart disease Father   . CVA Father   . Coronary artery disease Father   . Diabetes Mother   . Heart disease Mother   . Diabetes Mellitus I Mother   . Coronary artery disease Mother   . Coronary artery disease Brother   . Heart disease Brother   . Coronary artery disease Brother      Review of Systems  Positive ROS: neg  All other systems have been reviewed and were otherwise negative with the exception of those mentioned in the HPI and as above.  Objective: Vital signs in last 24 hours: Temp:  [98.5 F (36.9 C)] 98.5 F (36.9 C) (05/20 1106) Pulse Rate:  [66] 66 (05/20 1106) Resp:  [18] 18 (05/20 1106) BP: (163)/(59) 163/59 (05/20 1106) SpO2:  [100 %] 100 % (05/20 1106) Weight:  [89.9 kg] 89.9 kg (05/20  1106)  General Appearance: Alert, cooperative, no distress, appears stated age Head: Normocephalic, without obvious abnormality, atraumatic Eyes: PERRL, conjunctiva/corneas clear, EOM's intact    Neck: Supple, symmetrical, trachea midline Back: Symmetric, no curvature, ROM normal, no CVA tenderness Lungs:  respirations unlabored Heart: Regular rate and rhythm Abdomen: Soft, non-tender Extremities: Extremities normal, atraumatic, no cyanosis or edema Pulses: 2+ and symmetric all extremities Skin: Skin color, texture, turgor normal, no rashes or lesions  NEUROLOGIC:   Mental status: Alert and oriented x4,  no aphasia, good attention span, fund of knowledge, and memory Motor Exam - grossly normal Sensory Exam - grossly normal Reflexes: 1+ Coordination - grossly normal Gait -  grossly normal Balance - grossly normal Cranial Nerves: I: smell Not tested  II: visual acuity  OS: nl    OD: nl  II: visual fields Full to confrontation  II: pupils Equal, round, reactive to light  III,VII: ptosis None  III,IV,VI: extraocular muscles  Full ROM  V: mastication Normal  V: facial light touch sensation  Normal  V,VII: corneal reflex  Present  VII: facial muscle function - upper  Normal  VII: facial muscle function - lower Normal  VIII: hearing Not tested  IX: soft palate elevation  Normal  IX,X: gag reflex Present  XI: trapezius strength  5/5  XI: sternocleidomastoid strength 5/5  XI: neck flexion strength  5/5  XII: tongue strength  Normal    Data Review Lab Results  Component Value Date   WBC 7.2 10/13/2018   HGB 13.1 10/13/2018   HCT 40.2 10/13/2018   MCV 86.6 10/13/2018   PLT 313 10/13/2018   Lab Results  Component Value Date   NA 136 10/13/2018   K 3.4 (L) 10/13/2018   CL 99 10/13/2018   CO2 25 10/13/2018   BUN 19 10/13/2018   CREATININE 0.80 10/13/2018   GLUCOSE 84 10/13/2018   Lab Results  Component Value Date   INR 0.98 06/20/2018     Assessment/Plan:  Estimated body mass index is 29.27 kg/m as calculated from the following:   Height as of this encounter: 5\' 9"  (1.753 m).   Weight as of this encounter: 89.9 kg. Patient admitted for re-do L l4-5 microdiskectomy. Patient has failed a reasonable attempt at conservative therapy.  I explained the condition and procedure to the patient and answered any questions.  Patient wishes to proceed with procedure as planned. Understands risks/ benefits and typical outcomes of procedure.   Eustace Moore 10/15/2018 11:48 AM

## 2018-10-16 DIAGNOSIS — M5126 Other intervertebral disc displacement, lumbar region: Secondary | ICD-10-CM | POA: Diagnosis not present

## 2018-10-16 LAB — GLUCOSE, CAPILLARY: Glucose-Capillary: 183 mg/dL — ABNORMAL HIGH (ref 70–99)

## 2018-10-16 MED ORDER — OXYCODONE HCL 5 MG PO TABS: 5.0000 mg | ORAL_TABLET | ORAL | 0 refills | Status: DC | PRN

## 2018-10-16 NOTE — Discharge Summary (Signed)
Physician Discharge Summary  Patient ID: Luis Lindsey MRN: [id number] DOB/AGE: 1950/07/22 68 y.o.  Admit date: 10/15/2018 Discharge date: 10/16/2018  Admission Diagnoses: recurrent HNP lumbar    Discharge Diagnoses: same   Discharged Condition: good  Hospital Course: The patient was admitted on 10/15/2018 and taken to the operating room where the patient underwent microdiskectomy. The patient tolerated the procedure well and was taken to the recovery room and then to the floor in stable condition. The hospital course was routine. There were no complications. The wound remained clean dry and intact. Pt had appropriate back soreness. No complaints of leg pain or new N/T/W. The patient remained afebrile with stable vital signs, and tolerated a regular diet. The patient continued to increase activities, and pain was well controlled with oral pain medications.   Consults: None  Significant Diagnostic Studies:  Results for orders placed or performed during the hospital encounter of 10/15/18  CBC WITH DIFFERENTIAL  Result Value Ref Range   WBC 7.8 4.0 - 10.5 K/uL   RBC 4.55 4.22 - 5.81 MIL/uL   Hemoglobin 12.8 (L) 13.0 - 17.0 g/dL   HCT 39.7 39.0 - 52.0 %   MCV 87.3 80.0 - 100.0 fL   MCH 28.1 26.0 - 34.0 pg   MCHC 32.2 30.0 - 36.0 g/dL   RDW 16.9 (H) 11.5 - 15.5 %   Platelets 306 150 - 400 K/uL   nRBC 0.0 0.0 - 0.2 %   Neutrophils Relative % 61 %   Neutro Abs 4.7 1.7 - 7.7 K/uL   Lymphocytes Relative 29 %   Lymphs Abs 2.3 0.7 - 4.0 K/uL   Monocytes Relative 8 %   Monocytes Absolute 0.6 0.1 - 1.0 K/uL   Eosinophils Relative 1 %   Eosinophils Absolute 0.1 0.0 - 0.5 K/uL   Basophils Relative 1 %   Basophils Absolute 0.0 0.0 - 0.1 K/uL   Immature Granulocytes 0 %   Abs Immature Granulocytes 0.02 0.00 - 0.07 K/uL  Protime-INR  Result Value Ref Range   Prothrombin Time 13.9 11.4 - 15.2 seconds   INR 1.1 0.8 - 1.2  Glucose, capillary  Result Value Ref Range   Glucose-Capillary  108 (H) 70 - 99 mg/dL  Glucose, capillary  Result Value Ref Range   Glucose-Capillary 72 70 - 99 mg/dL  Glucose, capillary  Result Value Ref Range   Glucose-Capillary 97 70 - 99 mg/dL   Comment 1 Notify RN    Comment 2 Document in Chart   Glucose, capillary  Result Value Ref Range   Glucose-Capillary 112 (H) 70 - 99 mg/dL  Glucose, capillary  Result Value Ref Range   Glucose-Capillary 310 (H) 70 - 99 mg/dL   Comment 1 Notify RN    Comment 2 Document in Chart   Glucose, capillary  Result Value Ref Range   Glucose-Capillary 183 (H) 70 - 99 mg/dL   Comment 1 Notify RN    Comment 2 Document in Chart     Dg Lumbar Spine 1 View  Result Date: 10/15/2018 CLINICAL DATA:  Back pain.  Intraoperative localization images. EXAM: LUMBAR SPINE - 1 VIEW COMPARISON:  MRI dated 10/01/2018 FINDINGS: A single lateral intraoperative view of the lumbar spine was obtained. There is instrumentation at the L3-L4 level assuming there are 5 lumbar vertebral bodies. Multilevel degenerative changes are again noted. IMPRESSION: Instrumentation is noted at the L3-L4 level. Multilevel degenerative changes again visualized. Electronically Signed   By: Constance Holster M.D.   On: 10/15/2018  15:36    Antibiotics:  Anti-infectives (From admission, onward)   Start     Dose/Rate Route Frequency Ordered Stop   10/15/18 2100  ceFAZolin (ANCEF) IVPB 2g/100 mL premix     2 g 200 mL/hr over 30 Minutes Intravenous Every 8 hours 10/15/18 1605 10/16/18 0723   10/15/18 1345  bacitracin 50,000 Units in sodium chloride 0.9 % 500 mL irrigation  Status:  Discontinued       As needed 10/15/18 1345 10/15/18 1451   10/15/18 1204  ceFAZolin (ANCEF) 2-4 GM/100ML-% IVPB    Note to Pharmacy:  Bobbie Stack   : cabinet override      10/15/18 1204 10/15/18 1308   10/15/18 1200  ceFAZolin (ANCEF) IVPB 2g/100 mL premix     2 g 200 mL/hr over 30 Minutes Intravenous On call to O.R. 10/15/18 1155 10/15/18 1308      Discharge  Exam: Blood pressure (!) 145/55, pulse 64, temperature 98.1 F (36.7 C), temperature source Oral, resp. rate 16, height 5\' 9"  (1.753 m), weight 90.1 kg, SpO2 100 %. Neurologic: Grossly normal Dressing dry  Discharge Medications:   Allergies as of 10/16/2018      Reactions   Codeine Nausea Only      Medication List    STOP taking these medications   HYDROcodone-acetaminophen 5-325 MG tablet Commonly known as:  NORCO/VICODIN     TAKE these medications   amLODipine-benazepril 10-40 MG capsule Commonly known as:  LOTREL Take 1 capsule by mouth daily.   aspirin 325 MG tablet Commonly known as:  Bayer Aspirin Take 1 tablet (325 mg total) by mouth daily.   clopidogrel 75 MG tablet Commonly known as:  PLAVIX Take 75 mg by mouth daily with breakfast.   gabapentin 300 MG capsule Commonly known as:  NEURONTIN TAKE 1 CAPSULE BY MOUTH THREE TIMES A DAY What changed:  See the new instructions.   hydrochlorothiazide 25 MG tablet Commonly known as:  HYDRODIURIL Take 25 mg by mouth daily.   ibuprofen 200 MG tablet Commonly known as:  ADVIL Take 400 mg by mouth every 8 (eight) hours as needed for mild pain.   metFORMIN 500 MG 24 hr tablet Commonly known as:  GLUCOPHAGE-XR Take 1,000 mg by mouth 2 (two) times daily.   metoprolol succinate 50 MG 24 hr tablet Commonly known as:  TOPROL-XL Take 50 mg by mouth daily. Take with or immediately following a meal.   oxyCODONE 5 MG immediate release tablet Commonly known as:  Oxy IR/ROXICODONE Take 1 tablet (5 mg total) by mouth every 4 (four) hours as needed for moderate pain ((score 4 to 6)).   pantoprazole 40 MG tablet Commonly known as:  PROTONIX Take 40 mg by mouth daily with breakfast.   pioglitazone 45 MG tablet Commonly known as:  ACTOS TAKE 1 TABLET DAILY (NEED APPOINTMENT FOR FURTHER REFILLS) What changed:  See the new instructions.   repaglinide 0.5 MG tablet Commonly known as:  PRANDIN Take 1 mg by mouth 3 (three)  times daily before meals.   repaglinide 2 MG tablet Commonly known as:  PRANDIN Take 2 mg by mouth 3 (three) times daily before meals.   rosuvastatin 40 MG tablet Commonly known as:  CRESTOR Take 40 mg by mouth daily.   tiZANidine 2 MG tablet Commonly known as:  ZANAFLEX Take 1 tablet (2 mg total) by mouth every 6 (six) hours as needed.   Tyler Aas FlexTouch 100 UNIT/ML Sopn FlexTouch Pen Generic drug:  insulin degludec Inject 60  Units into the skin daily.       Disposition: home   Final Dx: re-do microdiskectomy L4-5 L  Discharge Instructions     Remove dressing in 72 hours   Complete by:  As directed    Call MD for:  difficulty breathing, headache or visual disturbances   Complete by:  As directed    Call MD for:  persistant nausea and vomiting   Complete by:  As directed    Call MD for:  redness, tenderness, or signs of infection (pain, swelling, redness, odor or green/yellow discharge around incision site)   Complete by:  As directed    Call MD for:  severe uncontrolled pain   Complete by:  As directed    Call MD for:  temperature >100.4   Complete by:  As directed    Diet - low sodium heart healthy   Complete by:  As directed    Increase activity slowly   Complete by:  As directed       Follow-up Information    Eustace Moore, MD. Schedule an appointment as soon as possible for a visit in 2 week(s).   Specialty:  Neurosurgery Contact information: [address] [place] [address] [phone]            Signed: Eustace Moore 10/16/2018, 8:30 AM

## 2018-10-17 ENCOUNTER — Telehealth (HOSPITAL_COMMUNITY): Payer: Self-pay

## 2018-10-17 ENCOUNTER — Encounter (HOSPITAL_COMMUNITY): Payer: Self-pay | Admitting: Neurological Surgery

## 2018-10-17 NOTE — Telephone Encounter (Signed)
The above patient or their representative was contacted and gave the following answers to these questions:         Do you have any of the following symptoms?  No  Had test--negative  Fever                    Cough                   Shortness of breath  Do  you have any of the following other symptoms?    muscle pain         vomiting,        diarrhea        rash         weakness        red eye        abdominal pain         bruising          bruising or bleeding              joint pain           severe headache    Have you been in contact with someone who was or has been sick in the past 2 weeks?  no  Yes                 Unsure                         Unable to assess   Does the person that you were in contact with have any of the following symptoms?   Cough         shortness of breath           muscle pain         vomiting,            diarrhea            rash            weakness           fever            red eye           abdominal pain           bruising  or  bleeding                joint pain                severe headache               Have you  or someone you have been in contact with traveled internationally in th last month?  no       If yes, which countries?   Have you  or someone you have been in contact with traveled outside [place] in th last month? no        If yes, which state and city?   COMMENTS OR ACTION PLAN FOR THIS PATIENT:

## 2018-10-21 ENCOUNTER — Encounter: Payer: Self-pay | Admitting: Vascular Surgery

## 2018-10-21 ENCOUNTER — Other Ambulatory Visit: Payer: Self-pay

## 2018-10-21 ENCOUNTER — Ambulatory Visit (INDEPENDENT_AMBULATORY_CARE_PROVIDER_SITE_OTHER): Payer: Medicare Other | Admitting: Vascular Surgery

## 2018-10-21 ENCOUNTER — Ambulatory Visit (HOSPITAL_COMMUNITY)
Admission: RE | Admit: 2018-10-21 | Discharge: 2018-10-21 | Disposition: A | Discharge status: Home / Self Care | Payer: Medicare Other | Source: Ambulatory Visit | Attending: Vascular Surgery | Admitting: Vascular Surgery

## 2018-10-21 ENCOUNTER — Ambulatory Visit (INDEPENDENT_AMBULATORY_CARE_PROVIDER_SITE_OTHER)
Admission: RE | Admit: 2018-10-21 | Discharge: 2018-10-21 | Disposition: A | Discharge status: Home / Self Care | Payer: Medicare Other | Source: Ambulatory Visit | Attending: Vascular Surgery | Admitting: Vascular Surgery

## 2018-10-21 VITALS — BP 143/71 | HR 66 | Temp 98.7°F | Resp 18 | Ht 69.0 in | Wt 193.0 lb

## 2018-10-21 DIAGNOSIS — I739 Peripheral vascular disease, unspecified: Secondary | ICD-10-CM | POA: Diagnosis not present

## 2018-10-21 NOTE — Progress Notes (Signed)
Patient name: Luis Lindsey MRN: [id number] DOB: Jul 19, 1950 Sex: male  REASON FOR VISIT: 55-month follow-up after left external iliac stent   HPI: Luis Lindsey is a 68 y.o. male presents for 42-month follow-up after left external iliac stent on 06/18/2018 for left lower extremity lifestyle limiting claudication.  Patient states he saw some initial improvement after the stent was placed but then he also underwent L4-L5 microdiscectomy with Dr. Ronnald Ramp and has seen even more continued improvement.  Very happy with his progress.  States he is starting to improve his mobility as he recovers from back surgery.  Overall feels his legs are doing much better.  No issues with the right leg.  Past Medical History:  Diagnosis Date  . Arthritis   . BCC (basal cell carcinoma)   . BPH (benign prostatic hyperplasia)   . CAROTID BRUIT, RIGHT 11/30/2009  . DM 11/30/2009  . GERD (gastroesophageal reflux disease)   . Heart murmur   . History of hiatal hernia   . History of Holter monitoring 2009  . History of kidney stones   . HNP (herniated nucleus pulposus), lumbar    L4-5  . HYPERCHOLESTEROLEMIA 11/30/2009  . HYPERTENSION 11/30/2009  . Kidney stones   . Peripheral vascular disease ([place])   . Right carotid bruit   . SMOKER 11/30/2009  . Stroke ([place]) 2012  . URINARY CALCULUS 11/30/2009    Past Surgical History:  Procedure Laterality Date  . ABDOMINAL AORTOGRAM W/LOWER EXTREMITY N/A 06/18/2018   Procedure: ABDOMINAL AORTOGRAM W/LOWER EXTREMITY;  Surgeon: Marty Heck, MD;  Location: [facility];  Service: Cardiovascular;  Laterality: N/A;  . arthroscopy of right shoulder Right    Jan 2019, June 2019-- done at [facility], Dr. Ninfa Linden  . BACK SURGERY    . CATARACT EXTRACTION W/ INTRAOCULAR LENS  IMPLANT, BILATERAL    . CYSTOSCOPY    . FACIAL COSMETIC SURGERY  2014  . FINGER SURGERY  2002  . LUMBAR LAMINECTOMY/DECOMPRESSION MICRODISCECTOMY Left 08/01/2018   Procedure:  Microdiscectomy - Lumbar four-Lumbar five - left;  Surgeon: Eustace Moore, MD;  Location: [facility];  Service: Neurosurgery;  Laterality: Left;  . LUMBAR LAMINECTOMY/DECOMPRESSION MICRODISCECTOMY Left 10/15/2018   Procedure: Re-do Microdiscectomy - left - Lumbar four-Lumbar five;  Surgeon: Eustace Moore, MD;  Location: [facility];  Service: Neurosurgery;  Laterality: Left;  . NECK SURGERY  1986  . PERIPHERAL VASCULAR INTERVENTION  06/18/2018   Procedure: PERIPHERAL VASCULAR INTERVENTION;  Surgeon: Marty Heck, MD;  Location: [facility];  Service: Cardiovascular;;  Left external iliac  . ROTATOR CUFF REPAIR Left   . UMBILICAL HERNIA REPAIR      Family History  Problem Relation Age of Onset  . Diabetes Sister   . Diabetes Mellitus I Sister   . Stroke Father   . Heart disease Father   . CVA Father   . Coronary artery disease Father   . Diabetes Mother   . Heart disease Mother   . Diabetes Mellitus I Mother   . Coronary artery disease Mother   . Coronary artery disease Brother   . Heart disease Brother   . Coronary artery disease Brother     SOCIAL HISTORY: Social History   Tobacco Use  . Smoking status: Former Smoker    Years: 40.00    Types: Cigarettes    Last attempt to quit: 12/2015    Years since quitting: 2.8  . Smokeless tobacco: Never Used  .  Tobacco comment: Quit 12-2012  Substance Use Topics  . Alcohol use: No    Allergies  Allergen Reactions  . Codeine Nausea Only    Current Outpatient Medications  Medication Sig Dispense Refill  . amLODipine-benazepril (LOTREL) 10-40 MG per capsule Take 1 capsule by mouth daily.      [name] aspirin (BAYER ASPIRIN) 325 MG tablet Take 1 tablet (325 mg total) by mouth daily. 30 tablet 6  . clopidogrel (PLAVIX) 75 MG tablet Take 75 mg by mouth daily with breakfast.    . gabapentin (NEURONTIN) 300 MG capsule TAKE 1 CAPSULE BY MOUTH THREE TIMES A DAY (Patient taking differently: Take 300 mg by mouth 3 (three) times daily. )  270 capsule 5  . hydrochlorothiazide (HYDRODIURIL) 25 MG tablet Take 25 mg by mouth daily.      . metFORMIN (GLUCOPHAGE-XR) 500 MG 24 hr tablet Take 1,000 mg by mouth 2 (two) times daily.    . metoprolol succinate (TOPROL-XL) 50 MG 24 hr tablet Take 50 mg by mouth daily. Take with or immediately following a meal.    . pantoprazole (PROTONIX) 40 MG tablet Take 40 mg by mouth daily with breakfast.     . pioglitazone (ACTOS) 45 MG tablet TAKE 1 TABLET DAILY (NEED APPOINTMENT FOR FURTHER REFILLS) (Patient taking differently: Take 45 mg by mouth daily. ) 90 tablet 0  . repaglinide (PRANDIN) 0.5 MG tablet Take 1 mg by mouth 3 (three) times daily before meals.   12  . rosuvastatin (CRESTOR) 40 MG tablet Take 40 mg by mouth daily.     . TRESIBA FLEXTOUCH 100 UNIT/ML SOPN FlexTouch Pen Inject 60 Units into the skin daily.   12  . ibuprofen (ADVIL) 200 MG tablet Take 400 mg by mouth every 8 (eight) hours as needed for mild pain.    [name] oxyCODONE (OXY IR/ROXICODONE) 5 MG immediate release tablet Take 1 tablet (5 mg total) by mouth every 4 (four) hours as needed for moderate pain ((score 4 to 6)). (Patient not taking: Reported on 10/21/2018) 40 tablet 0  . repaglinide (PRANDIN) 2 MG tablet Take 2 mg by mouth 3 (three) times daily before meals.     [name] tiZANidine (ZANAFLEX) 2 MG tablet Take 1 tablet (2 mg total) by mouth every 6 (six) hours as needed. (Patient not taking: Reported on 10/21/2018) 60 tablet 1   No current facility-administered medications for this visit.     REVIEW OF SYSTEMS:  [X]  denotes positive finding, [ ]  denotes negative finding Cardiac  Comments:  Chest pain or chest pressure:    Shortness of breath upon exertion:    Short of breath when lying flat:    Irregular heart rhythm:        Vascular    Pain in calf, thigh, or hip brought on by ambulation:    Pain in feet at night that wakes you up from your sleep:     Blood clot in your veins:    Leg swelling:         Pulmonary    Oxygen  at home:    Productive cough:     Wheezing:         Neurologic    Sudden weakness in arms or legs:     Sudden numbness in arms or legs:     Sudden onset of difficulty speaking or slurred speech:    Temporary loss of vision in one eye:     Problems with dizziness:  Gastrointestinal    Blood in stool:     Vomited blood:         Genitourinary    Burning when urinating:     Blood in urine:        Psychiatric    Major depression:         Hematologic    Bleeding problems:    Problems with blood clotting too easily:        Skin    Rashes or ulcers:        Constitutional    Fever or chills:      PHYSICAL EXAM: Vitals:   10/21/18 0833  BP: (!) 143/71  Pulse: 66  Resp: 18  Temp: 98.7 F (37.1 C)  TempSrc: Oral  SpO2: 99%  Weight: 193 lb (87.5 kg)  Height: 5\' 9"  (1.753 m)    GENERAL: The patient is a well-nourished male, in no acute distress. The vital signs are documented above. CARDIAC: There is a regular rate and rhythm.  VASCULAR:  1+ femoral pulse palpable bilateral groins Monophasic DP/PT signals BLE No tissue loss PULMONARY: There is good air exchange bilaterally without wheezing or rales. ABDOMEN: Soft and non-tender with normal pitched bowel sounds.  MUSCULOSKELETAL: There are no major deformities or cyanosis. NEUROLOGIC: No focal weakness or paresthesias are detected.   DATA:   ABIs 0.66 on the right with monophasic waveform and 0.51 on the left with a biphasic waveform  Left external iliac stent is patent; however, there is a velocity of 267 in the proximal stent  Assessment/Plan:  68 year old male that initially presented with left lower extremity short distance claudication and underwent left external iliac stent.  He feels that his left leg is doing much better particularly after having an L4-L5 microdiscectomy with Dr. Ronnald Ramp.  His left external iliac stent is patent although he has a elevated velocity of 267 in the proximal stent.  I  reviewed his arteriogram images with him and discussed if he has any recurrent issues with left lower extremity likely would need a left femoral endarterectomy with profundoplasty and patch of a proximal SFA stenosis and could re-evaluate left iliac stent at that time.  Overall he is happy with his progress and states his leg feels much better.  No plan for intervention at this time.  Plan to see him back in 6 months with repeat ABIs and iliac duplex.   Marty Heck, MD [facility] Office: [phone] Pager: [place]

## 2018-11-17 ENCOUNTER — Ambulatory Visit: Payer: Self-pay | Admitting: Neurology

## 2018-11-24 ENCOUNTER — Other Ambulatory Visit: Payer: Self-pay

## 2018-11-24 ENCOUNTER — Encounter: Payer: Self-pay | Admitting: Neurology

## 2018-11-24 ENCOUNTER — Ambulatory Visit: Payer: Medicare Other | Admitting: Neurology

## 2018-11-24 VITALS — BP 124/62 | HR 65 | Temp 98.0°F | Ht 69.0 in | Wt 195.8 lb

## 2018-11-24 DIAGNOSIS — G459 Transient cerebral ischemic attack, unspecified: Secondary | ICD-10-CM | POA: Diagnosis not present

## 2018-11-24 DIAGNOSIS — M5416 Radiculopathy, lumbar region: Secondary | ICD-10-CM | POA: Diagnosis not present

## 2018-11-24 DIAGNOSIS — I739 Peripheral vascular disease, unspecified: Secondary | ICD-10-CM | POA: Diagnosis not present

## 2018-11-24 MED ORDER — CLOPIDOGREL BISULFATE 75 MG PO TABS: 75.0000 mg | ORAL_TABLET | Freq: Every day | ORAL | 4 refills | Status: DC

## 2018-11-24 MED ORDER — GABAPENTIN 300 MG PO CAPS: 300.0000 mg | ORAL_CAPSULE | Freq: Three times a day (TID) | ORAL | 4 refills | Status: DC

## 2018-11-24 NOTE — Progress Notes (Signed)
PATIENT: Luis Lindsey DOB: [birth year]  Chief Complaint  Patient presents with  . Peripheral Neuropathy    He is still taking gabapentin 300mg  TID but feels it is not helping as much now.  His symptoms are constant.  He takes Aleve at bedtime to help with the discomfort so he can sleep.      HISTORICAL  Luis Lindsey is a 68 years old male, seen in request by his primary care physician Dr. Donnie Coffin for evaluation of peripheral neuropathy, initial evaluation was on April 01, 2018.  I have reviewed and summarized the referring note from the referring physician.  He has past medical history of hypertension, diabetes 20 years, hyperlipidemia, previously heavy smoker of 1 pack a day for 40 years, stopped in 2017  Since 2017, he noticed numbness tingling burning sensation at the bottom of his feet, gradually work its way up now to bilateral ankle level, since 2019, he also noticed mild bilateral fingertips numbness tingling, his fingertip tends to turn to white, sharp burning pain when he grab grocery from freezer.  Since 2016, he also noticed left lower extremity radiating pain, initially it does not put significant limitation to his daily function, now has worsening radiating pain, especially when walking, now he cannot finish shopping at a grocery store, developed left calf deep achy pain after walking short distance.  He denies similar involvement of right lower extremity, denies bowel and bladder incontinence.  Electrodiagnostic study on February 21, 2018 showed evidence of bilateral axonal sensorimotor polyneuropathy, there is no evidence of active lumbosacral radiculopathy.  He also reported, if he does not have enough water intake, he noticed more left lower extremity pain, left foot swelling will decrease with increasing water intake  UPDATE Apr 29 2018: He continues to have significant left low back pain, radiating pain to left leg, we personally reviewed MRI of lumbar spine  November 2019, L5-S1 large left paramedian disc herniation compressing the left S1 nerve roots within the left lateral recess,  X-ray of left hip in September 2019: Mild left hip joint degenerative changes.  UPDATE November 15 2018: I reviewed Dr. Carlis Abbott vein specialist note on Oct 21, 2018, he had left external iliac stent on June 18, 2018, for left lower extremity lifestyle limiting claudications, also had left L4-5 hemi-laminectomy medial facetectomy foraminotomy followed by redo microdiscectomy L4-5 on the left side by Dr. Sherley Bounds on Oct 15, 2018.  He can walk much better, only has mild left low back pain.  He is on Asa and plavix, I have advise him to be on single antiplatelet agent Plavix alone  REVIEW OF SYSTEMS: Full 14 system review of systems performed and notable only for as above  all other review of systems were negative.  ALLERGIES: Allergies  Allergen Reactions  . Codeine Nausea Only    HOME MEDICATIONS: Current Outpatient Medications  Medication Sig Dispense Refill  . amLODipine-benazepril (LOTREL) 10-40 MG per capsule Take 1 capsule by mouth daily.      [name] aspirin (BAYER ASPIRIN) 325 MG tablet Take 1 tablet (325 mg total) by mouth daily. 30 tablet 6  . clopidogrel (PLAVIX) 75 MG tablet Take 75 mg by mouth daily with breakfast.    . gabapentin (NEURONTIN) 300 MG capsule TAKE 1 CAPSULE BY MOUTH THREE TIMES A DAY (Patient taking differently: Take 300 mg by mouth 3 (three) times daily. ) 270 capsule 5  . hydrochlorothiazide (HYDRODIURIL) 25 MG tablet Take 25 mg by mouth daily.      [name]  metFORMIN (GLUCOPHAGE-XR) 500 MG 24 hr tablet Take 1,000 mg by mouth 2 (two) times daily.    . metoprolol succinate (TOPROL-XL) 50 MG 24 hr tablet Take 50 mg by mouth daily. Take with or immediately following a meal.    . naproxen sodium (ALEVE) 220 MG tablet Take 220 mg by mouth as needed.    . pantoprazole (PROTONIX) 40 MG tablet Take 40 mg by mouth daily with breakfast.     . pioglitazone  (ACTOS) 45 MG tablet TAKE 1 TABLET DAILY (NEED APPOINTMENT FOR FURTHER REFILLS) (Patient taking differently: Take 45 mg by mouth daily. ) 90 tablet 0  . repaglinide (PRANDIN) 0.5 MG tablet Take 1 mg by mouth 3 (three) times daily before meals.   12  . repaglinide (PRANDIN) 2 MG tablet Take 2 mg by mouth 3 (three) times daily before meals.     . rosuvastatin (CRESTOR) 40 MG tablet Take 40 mg by mouth daily.     . TRESIBA FLEXTOUCH 100 UNIT/ML SOPN FlexTouch Pen Inject 60 Units into the skin daily.   12   No current facility-administered medications for this visit.     PAST MEDICAL HISTORY: Past Medical History:  Diagnosis Date  . Arthritis   . BCC (basal cell carcinoma)   . BPH (benign prostatic hyperplasia)   . CAROTID BRUIT, RIGHT 11/30/2009  . DM 11/30/2009  . GERD (gastroesophageal reflux disease)   . Heart murmur   . History of hiatal hernia   . History of Holter monitoring 2009  . History of kidney stones   . HNP (herniated nucleus pulposus), lumbar    L4-5  . HYPERCHOLESTEROLEMIA 11/30/2009  . HYPERTENSION 11/30/2009  . Kidney stones   . Peripheral vascular disease ([place])   . Right carotid bruit   . SMOKER 11/30/2009  . Stroke ([place]) 2012  . URINARY CALCULUS 11/30/2009    PAST SURGICAL HISTORY: Past Surgical History:  Procedure Laterality Date  . ABDOMINAL AORTOGRAM W/LOWER EXTREMITY N/A 06/18/2018   Procedure: ABDOMINAL AORTOGRAM W/LOWER EXTREMITY;  Surgeon: Marty Heck, MD;  Location: [facility];  Service: Cardiovascular;  Laterality: N/A;  . arthroscopy of right shoulder Right    Jan 2019, June 2019-- done at [facility], Dr. Ninfa Linden  . BACK SURGERY    . CATARACT EXTRACTION W/ INTRAOCULAR LENS  IMPLANT, BILATERAL    . CYSTOSCOPY    . FACIAL COSMETIC SURGERY  2014  . FINGER SURGERY  2002  . LUMBAR LAMINECTOMY/DECOMPRESSION MICRODISCECTOMY Left 08/01/2018   Procedure: Microdiscectomy - Lumbar four-Lumbar five - left;  Surgeon: Eustace Moore,  MD;  Location: [facility];  Service: Neurosurgery;  Laterality: Left;  . LUMBAR LAMINECTOMY/DECOMPRESSION MICRODISCECTOMY Left 10/15/2018   Procedure: Re-do Microdiscectomy - left - Lumbar four-Lumbar five;  Surgeon: Eustace Moore, MD;  Location: [facility];  Service: Neurosurgery;  Laterality: Left;  . NECK SURGERY  1986  . PERIPHERAL VASCULAR INTERVENTION  06/18/2018   Procedure: PERIPHERAL VASCULAR INTERVENTION;  Surgeon: Marty Heck, MD;  Location: [facility];  Service: Cardiovascular;;  Left external iliac  . ROTATOR CUFF REPAIR Left   . UMBILICAL HERNIA REPAIR      FAMILY HISTORY: Family History  Problem Relation Age of Onset  . Diabetes Sister   . Diabetes Mellitus I Sister   . Stroke Father   . Heart disease Father   . CVA Father   . Coronary artery disease Father   . Diabetes Mother   . Heart disease  Mother   . Diabetes Mellitus I Mother   . Coronary artery disease Mother   . Coronary artery disease Brother   . Heart disease Brother   . Coronary artery disease Brother     SOCIAL HISTORY: Social History   Socioeconomic History  . Marital status: Married    Spouse name: [name]   . Number of children: 3  . Years of education: 64  . Highest education level: Not on file  Occupational History  . Occupation: Truck Education administrator: [employer]  . Financial resource strain: Not on file  . Food insecurity    Worry: Not on file    Inability: Not on file  . Transportation needs    Medical: Not on file    Non-medical: Not on file  Tobacco Use  . Smoking status: Former Smoker    Years: 40.00    Types: Cigarettes    Quit date: 12/2015    Years since quitting: 2.9  . Smokeless tobacco: Never Used  . Tobacco comment: Quit 12-2012  Substance and Sexual Activity  . Alcohol use: No  . Drug use: No  . Sexual activity: Not on file  Lifestyle  . Physical activity    Days per week: Not on file    Minutes per session: Not on file  . Stress: Not on file   Relationships  . Social Herbalist on phone: Not on file    Gets together: Not on file    Attends religious service: Not on file    Active member of club or organization: Not on file    Attends meetings of clubs or organizations: Not on file    Relationship status: Not on file  . Intimate partner violence    Fear of current or ex partner: Not on file    Emotionally abused: Not on file    Physically abused: Not on file    Forced sexual activity: Not on file  Other Topics Concern  . Not on file  Social History Narrative   . Patient drinks 2-3 cups of caffeine daily.    Patient lives at home with his wife [name].    Patient works at [employer].    Education. 12 th grade   Right handed                 PHYSICAL EXAM   Vitals:   11/24/18 1043  BP: 124/62  Pulse: 65  Temp: 98 F (36.7 C)  Weight: 195 lb 12 oz (88.8 kg)  Height: 5\' 9"  (1.753 m)    Not recorded      Body mass index is 28.91 kg/m.  PHYSICAL EXAMNIATION:  Gen: NAD, conversant, well nourised, obese, well groomed                     Cardiovascular: Regular rate rhythm, no peripheral edema, warm, nontender. Eyes: Conjunctivae clear without exudates or hemorrhage Neck: Supple, no carotid bruits. Pulmonary: Clear to auscultation bilaterally   NEUROLOGICAL EXAM:  MENTAL STATUS: Speech:    Speech is normal; fluent and spontaneous with normal comprehension.  Cognition:     Orientation to time, place and person     Normal recent and remote memory     Normal Attention span and concentration     Normal Language, naming, repeating,spontaneous speech     Fund of knowledge   CRANIAL NERVES: CN II: Visual fields are full to confrontation. Pupils are round  equal and briskly reactive to light. CN III, IV, VI: extraocular movement are normal. No ptosis. CN V: Facial sensation is intact to pinprick in all 3 divisions bilaterally. Corneal responses are intact.  CN VII: Face is symmetric with normal eye  closure and smile. CN VIII: Hearing is normal to rubbing fingers CN IX, X: Palate elevates symmetrically. Phonation is normal. CN XI: Head turning and shoulder shrug are intact CN XII: Tongue is midline with normal movements and no atrophy.  MOTOR: He has mild left ankle dorsiflexion, toe extension weakness  REFLEXES: Reflexes are 1 and symmetric at the biceps, triceps, knees, and absent at ankles. Plantar responses are flexor.  SENSORY: Length dependent decreased to light touch and pinprick to left knee level, milder length dependent sensory change at right leg  COORDINATION: Rapid alternating movements and fine finger movements are intact. There is no dysmetria on finger-to-nose and heel-knee-shin.    GAIT/STANCE: Steady gait, mild difficulty perform heel walking bilaterally   DIAGNOSTIC DATA (LABS, IMAGING, TESTING) - I reviewed patient records, labs, notes, testing and imaging myself where available.   ASSESSMENT AND PLAN  RONIEL HALLORAN is a 68 y.o. male   Peripheral neuropathy  Most likely related to diabetes  Laboratory evaluation was 8.9 in February 2020   Keep gabapentin 300 mg 3 times daily  Lower lumbar radiculopathy  Status post redo of left L4-5 hemilaminectomy,  Peripheral vascular disease, left external iliac artery 60 to 70% stenosis, status post stent in May 2020   Left external iliac artery stent placement with angioplasty  Keep Plavix 75 mg daily   Marcial Pacas, M.D. Ph.D.  [facility] [address], [place], [place] [address] Ph: [phone] Fax: [phone]  CC: Referring Provider

## 2019-02-13 ENCOUNTER — Other Ambulatory Visit: Payer: Self-pay

## 2019-02-13 NOTE — Patient Outreach (Signed)
  [facility]) Care Management Chronic Special Needs Program  02/13/2019  Name: ESSA HANDSHOE DOB: 1950/06/10  MRN: [id number]  Mr. Hensel Crilley is enrolled in a chronic special needs plan for Diabetes. Client called with no answer No answer and HIPAA compliant message left. Plan for 2nd outreach call in one week Chronic care management coordinator will attempt outreach in one week.   [name] RN, Jackquline Denmark, CDE Chronic Care Management Coordinator [facility] Care Management [phone]

## 2019-02-20 ENCOUNTER — Other Ambulatory Visit: Payer: Self-pay

## 2019-02-20 NOTE — Patient Outreach (Signed)
[facility]) Care Management Chronic Special Needs Program  02/20/2019  Name: JARICK BASISTA DOB: 02/10/1951  MRN: [id number]  Mr. Ethanmichael Flores is enrolled in a chronic special needs plan for Diabetes. Chronic Care Management Coordinator telephoned client to review health risk assessment and to develop individualized care plan.  Introduced the chronic care management program, importance of client participation, and taking their care plan to all provider appointments and inpatient facilities.  Reviewed the transition of care process and possible referral to community care management.  Subjective: Client states he has been trying to eat better and he has been eating out less since the Covid shut down.  States he checks his blood sugars about every other day and they range 140-150 in the morning and 130-180 later in the day.  Denies many low readings.  States his Tyler Aas is expensive as he is in the coverage gap.  States his blood pressure is good and his stroke was 8 yrs ago.    Goals Addressed            This Visit's Progress   .  Acknowledge receipt of Programme researcher, broadcasting/film/video      . Client understands the importance of follow-up with providers by attending scheduled visits      . Client will not report change from baseline and no repeated symptoms of stroke with in the next 9 months      . Client will report no fall or injuries in the next 6 months.      . Client will use Assistive Devices as needed and verbalize understanding of device use      . Client will verbalize knowledge of self management of Hypertension as evidences by BP reading of 140/90 or less; or as defined by provider      . Decrease inpatient admissions/ readmissions with in the next year      . HEMOGLOBIN A1C < 7.0       Diabetes self management actions:  Glucose monitoring per provider recommendations  Perform Quality checks on blood meter  Eat Healthy  Check feet daily  Visit provider every  3-6 months as directed  Hbg A1C level every 3-6 months.  Eye Exam yearly    . Maintain timely refills of diabetic medication as prescribed within the year .      [name] COMPLETED: Obtain annual  Lipid Profile, LDL-C       Completed 09/10/18    . Obtain Annual Eye (retinal)  Exam       . Obtain Annual Foot Exam      . COMPLETED: Obtain annual screen for micro albuminuria (urine) , nephropathy (kidney problems)       Completed 09/10/18    . Obtain Hemoglobin A1C at least 2 times per year       Checked 09/10/18    . Visit Primary Care Provider or Endocrinologist at least 2 times per year        Client is not meeting diabetes self management goal of hemoglobin A1C of <7% with last reading of 9.3% Agreeable to referral to pharmacy for pharmacy assistance and for >8 medications. Agreeable to referral to Social Work to assist with Advanced Directives  Instructed to follow low CHO low sodium diet  Encouraged to check CBG at least daily and to keep lgo to take to MD Encouraged to call and schedule annual eye exam Reviewed number for 24-hour [place] 19 precautions  Plan:  Send successful outreach  letter with a copy of their individualized care plan, Send individual care plan to provider and Send educational material-Advanced Directives packet, EMMI- HTN, stroke and Advanced Directives   Chronic care management coordination will outreach in:  3 Months  Will refer client to:  Social Work and [place] RN, [facility], [place] Programmer, multimedia Care Management [phone]

## 2019-02-24 ENCOUNTER — Telehealth: Payer: Self-pay | Admitting: Pharmacist

## 2019-02-24 NOTE — Patient Outreach (Signed)
[place] [facility]) Care Management  02/24/2019  [name] 1951-03-24 [id number]  Patient was called regarding medication assistance with [place]. Unfortunately, he did not answer either number listed in his chart. The number listed as the home number was busy first then rang >20 times without an answer or voicemail pick up. The number listed as his mobile number had a recording that said:  "the party you are calling is not available".  Patient will be sent an unsuccessful contact letter.  Elayne Guerin, PharmD, [place] Clinical Pharmacist [phone]

## 2019-02-24 NOTE — Telephone Encounter (Signed)
-----   Message from [facility] sent at 02/23/2019 10:24 AM EDT ----- Regarding: RE: Order for [name],  Please call [name], see message below.  Thank you ----- Message ----- From: Dimitri Ped, RN Sent: 02/20/2019   3:24 PM EDT To: Thn Cm Communication Orders Subject: Order for Luis Lindsey, Luis Lindsey                        Patient Name: Luis Lindsey, Luis Lindsey [id number]) Sex: Male DOB: Oct 08, 1950    PCP: Donavan Burnet   Center: [facility]   Types of orders made on 02/20/2019: Nursing  Order Date:02/20/2019 [place], [name] [id number] Encounter Provider:[place], [name], RN [id number] Authorizing Provider : Alroy Dust, Carlean Jews.[name], [place] Department:THN-LINK TO [place]  Order Specific Information Order: Comm to Pharmacy [Custom: [id number]  Order #: [id number] Qty: 1   Priority: Routine  Class: Clinic Performed   Comment:CSNP client with >8 medications and needs assistance with the cost of            [place]     Reason for Consult -> Medication Assistance          -> Polypharmacy     Dis position: Return in about 3 months (around 05/22/2019) for Diabetes follow  up call.   Priority: Routine  Class: Clinic Performed   Comment:CSNP client with >8 medications and needs assistance with the cost of            [place]     Reason for Consult -> Medication Assistance          -> Polypharmacy

## 2019-02-26 ENCOUNTER — Other Ambulatory Visit: Payer: Self-pay

## 2019-02-26 NOTE — Patient Outreach (Signed)
[place] [facility]) Care Management  02/26/2019  Luis Lindsey 1951-05-02 [id number]   Social work referral received to assist patient with completion of Advance Directives.   Advance Directive packet mailed to patient by [facility], [name], on 02/20/19.  Will follow up with patient within the next two weeks to ensure receipt and assist with completion.  Ronn Melena, BSW Social Worker [phone]

## 2019-02-27 ENCOUNTER — Ambulatory Visit: Payer: Self-pay | Admitting: *Deleted

## 2019-03-03 DIAGNOSIS — E1142 Type 2 diabetes mellitus with diabetic polyneuropathy: Secondary | ICD-10-CM | POA: Diagnosis not present

## 2019-03-03 DIAGNOSIS — E782 Mixed hyperlipidemia: Secondary | ICD-10-CM | POA: Diagnosis not present

## 2019-03-03 DIAGNOSIS — I10 Essential (primary) hypertension: Secondary | ICD-10-CM | POA: Diagnosis not present

## 2019-03-03 DIAGNOSIS — K219 Gastro-esophageal reflux disease without esophagitis: Secondary | ICD-10-CM | POA: Diagnosis not present

## 2019-03-05 ENCOUNTER — Ambulatory Visit: Payer: Self-pay | Admitting: Pharmacist

## 2019-03-05 ENCOUNTER — Other Ambulatory Visit: Payer: Self-pay | Admitting: Pharmacist

## 2019-03-05 NOTE — Patient Outreach (Signed)
[place] [facility]) Care Management  03/05/2019  Luis Lindsey [birth year] [id number]   Patient was called regarding medication assistance with [place]. Unfortunately, he did not answer either number listed in his chart. The number listed as the home number was busy first then rang >20 times without an answer or voicemail pick up. A message was left on the number listed as his mobile number.  Patient was sent an unsuccessful outreach letter on 02/24/2019  Plan: Call patient back in 2-3 weeks.   Elayne Guerin, PharmD, [place] Clinical Pharmacist [phone]

## 2019-03-10 ENCOUNTER — Ambulatory Visit: Payer: Self-pay

## 2019-03-10 ENCOUNTER — Other Ambulatory Visit: Payer: Self-pay

## 2019-03-10 NOTE — Patient Outreach (Signed)
[place] [facility]) Care Management  03/10/2019  Luis Lindsey 12/21/1950 [id number]   Attempted to contact patient today to ensure receipt of/review Advance Directives.  No answer or option to leave message at home number.  Left message at mobile number. Will attempt to reach again within four business days.   Ronn Melena, BSW Social Worker [phone]

## 2019-03-12 ENCOUNTER — Ambulatory Visit: Payer: HMO

## 2019-03-12 ENCOUNTER — Other Ambulatory Visit: Payer: Self-pay

## 2019-03-12 ENCOUNTER — Ambulatory Visit: Payer: Self-pay

## 2019-03-12 NOTE — Patient Outreach (Signed)
[place] [facility]) Care Management  03/12/2019  Luis Lindsey Aug 16, 1950 [id number]   Second to contact patient today to ensure receipt of/review Advance Directives.  No answer or option to leave message at home number.  Left message at mobile number. Will attempt to reach again within four business days.   Ronn Melena, BSW Social Worker [phone]

## 2019-03-13 ENCOUNTER — Ambulatory Visit: Payer: Self-pay

## 2019-03-16 ENCOUNTER — Other Ambulatory Visit: Payer: Self-pay

## 2019-03-16 ENCOUNTER — Ambulatory Visit: Payer: Self-pay

## 2019-03-16 NOTE — Patient Outreach (Signed)
[place] [facility]) Care Management  03/16/2019  Luis Lindsey Oct 07, 1950 [id number]   Successful follow up call to patient today, however, he did not receive packet that was mailed by [facility], [name] on 02/20/19.  Will resend Advance Directive Emmi and packet today.  In-basket message sent to [facility] to inform her that patient did not receive Successful Outreach packet.    Ronn Melena, BSW Social Worker [phone]

## 2019-03-27 ENCOUNTER — Other Ambulatory Visit: Payer: Self-pay

## 2019-03-27 ENCOUNTER — Ambulatory Visit: Payer: Self-pay

## 2019-03-27 NOTE — Patient Outreach (Signed)
[place] [facility]) Care Management  03/27/2019  GOULD SNIDE 1951-05-12 [id number]   Attempted to contact patient today to ensure receipt of Advance Directives.  No answer or option to leave message.  Will attempt to reach again within four business days.  Ronn Melena, BSW Social Worker [phone]

## 2019-03-30 ENCOUNTER — Ambulatory Visit: Payer: Self-pay | Admitting: Pharmacist

## 2019-03-30 ENCOUNTER — Other Ambulatory Visit: Payer: Self-pay | Admitting: Pharmacist

## 2019-03-30 NOTE — Patient Outreach (Signed)
[place] [facility]) Care Management  03/30/2019  Luis Lindsey 03/02/1951 [id number]   Patient was called regarding medication assistance with [place]. Unfortunately, he did not answer either number listed in his chart. The number listed as the home number was busy first then rang >20 times without an answer or voicemail pick up. A message was left on the number listed as his mobile number.  Patient was sent an unsuccessful outreach letter on 02/24/2019  Plan: Call patient back in 4-6 weeks.  Elayne Guerin, PharmD, [place] Clinical Pharmacist [phone]

## 2019-04-02 ENCOUNTER — Ambulatory Visit: Payer: Self-pay

## 2019-04-02 ENCOUNTER — Other Ambulatory Visit: Payer: Self-pay

## 2019-04-02 NOTE — Patient Outreach (Signed)
[place] [facility]) Care Management  04/02/2019  Luis Lindsey 12-30-1950 [id number]   Second attempt to contact patient today to ensure receipt of Advance Directives.  No answer or option to leave message on home number.  Left message on mobile number. Will attempt to reach again within four business days.  Ronn Melena, BSW Social Worker [phone]

## 2019-04-06 ENCOUNTER — Ambulatory Visit: Payer: Self-pay

## 2019-04-06 ENCOUNTER — Other Ambulatory Visit: Payer: Self-pay

## 2019-04-06 NOTE — Patient Outreach (Signed)
[name]  04/06/2019  Luis Lindsey Sep 25, 1950 [id number]   Third attempt to contact patient today to ensure receipt of Advance Directives.  No answer or option to leave message on home number.  Left message on mobile number.  Will close social work case if no response by 04/09/19.  Ronn Melena, BSW Social Worker [phone]

## 2019-04-09 ENCOUNTER — Other Ambulatory Visit: Payer: Self-pay

## 2019-04-09 NOTE — Patient Outreach (Signed)
[place] [facility]) Care Management  04/09/2019  Luis Lindsey [birth year] [id number]   Social work closure due to inability to maintain contact.   Ronn Melena, BSW Social Worker [phone]

## 2019-05-04 ENCOUNTER — Other Ambulatory Visit: Payer: Self-pay | Admitting: Pharmacist

## 2019-05-04 NOTE — Patient Outreach (Addendum)
[place] [facility]) Care Management  [place]   05/04/2019  Luis Lindsey 11-25-1950 [id number]  Reason for referral: medication assistance  Referral source: [place] Nurse Referral medication(s): Tyler Aas Current insurance: HTA/CSNP  HPI: Patient is a 68 year old male with multiple medical conditions including but not limited to:  Type 2 diabetes, hyperlipidemia, hypertension, history of TIA, and peripheral neuropathy.  Patient reported that by the end of the year, he ends up in the donut hole and has difficulty paying for [place].  Objective: Allergies  Allergen Reactions  . Codeine Nausea Only    Medications Reviewed Today    Reviewed by Elayne Guerin, [facility] (Pharmacist) on 05/04/19 at 1016  Med List Status: <None>  Medication Order Taking? Sig Documenting Provider Last Dose Status Informant  amLODipine-benazepril (LOTREL) 10-40 MG per capsule [id number] Yes Take 1 capsule by mouth daily.   [provider] Taking Active Self       Patient not taking:      Discontinued 05/04/19 1014 (Completed Course)            Med Note Rich Reining, MICHELLE C   Mon Nov 24, 2018 10:45 AM)    clopidogrel (PLAVIX) 75 MG tablet [id number] Yes Take 1 tablet (75 mg total) by mouth daily. Marcial Pacas, MD Taking Active   gabapentin (NEURONTIN) 300 MG capsule [id number] Yes Take 1 capsule (300 mg total) by mouth 3 (three) times daily. Marcial Pacas, MD Taking Active   hydrochlorothiazide (HYDRODIURIL) 25 MG tablet [id number] Yes Take 25 mg by mouth daily.   [provider] Taking Active Self  metFORMIN (GLUCOPHAGE-XR) 500 MG 24 hr tablet [id number] Yes Take 1,000 mg by mouth 2 (two) times daily. [provider] Taking Active Self  metoprolol succinate (TOPROL-XL) 50 MG 24 hr tablet [id number] Yes Take 50 mg by mouth daily. Take with or immediately following a meal. [provider] Taking Active Self        Discontinued 05/04/19 1015 (Discontinued by provider)    pantoprazole (PROTONIX) 40 MG tablet [id number] Yes Take 40 mg by mouth daily with breakfast.  [provider] Taking Active Self  pioglitazone (ACTOS) 45 MG tablet [id number] Yes TAKE 1 TABLET DAILY (NEED APPOINTMENT FOR FURTHER REFILLS)  Patient taking differently: Take 45 mg by mouth daily.    Renato Shin, MD Taking Active Self        Discontinued 05/04/19 1016 (Dose change)   repaglinide (PRANDIN) 2 MG tablet [id number] Yes Take 2 mg by mouth 3 (three) times daily before meals.  [provider] Taking Active Self           Med Note Rich Reining, Elson Clan Nov 24, 2018 10:45 AM)    rosuvastatin (CRESTOR) 40 MG tablet [id number] Yes Take 40 mg by mouth daily.  [provider] Taking Active Self  TRESIBA FLEXTOUCH 100 UNIT/ML SOPN FlexTouch Pen [id number] Yes Inject 60 Units into the skin daily.  [provider] Taking Active Self           Med Note Corinna Lines May 04, 2019 10:16 AM)            Assessment:  Drugs sorted by system:  Neurologic/Psychologic: Gabapentin  Cardiovascular: Amlodipine-Benazepril, Clopidogrel, HCTZ, Metoprolol Succinate, Rosuvastatin  Gastrointestinal: Pantoprazole  Endocrine: Metformin, Replaglinide, Tyler Aas  Medication Review Findings: HgA1c-8.9% (9 months ago)  On statin-Rosuvastatin last filled   Medication Assistance Findings: Patient may qualify to receive [place] through [employer]  Patient Assistance Program.  He is over income for Extra Help.  Discussed the necessary financial documentation.  Patient did not file taxes.  Patient will be sent a coupon to get 2 free boxes of [place] from [employer].  Plan: I will route patient assistance letter to [place] [facility] technician who will coordinate patient assistance program application process for medications listed above.  [facility] pharmacy technician will assist with obtaining all required documents from both patient and provider(s) and submit  application(s) once completed.    Follow up in 6-8 weeks.  Elayne Guerin, PharmD, [place] Clinical Pharmacist [phone]

## 2019-05-05 ENCOUNTER — Other Ambulatory Visit: Payer: Self-pay | Admitting: Pharmacy Technician

## 2019-05-05 NOTE — Patient Outreach (Signed)
[place] [facility]) Care Management  05/05/2019  NEVEN FICARA [birth year] [id number]                                        Medication Assistance Referral  Referral From: [place]  Medication/Company: Tyler Aas / [employer] Patient application portion:  Education officer, museum portion: Faxed  to Dr Carlean Jews Donnie Coffin on 05/30/2019 Provider address/fax verified via: Office website  This is for 2021 patient assistance   Follow up:  Will follow up with patient in 20-30 business days to confirm application(s) have been received.  Camesha Farooq P. Kezia Benevides, [place] Management [phone]

## 2019-05-07 ENCOUNTER — Other Ambulatory Visit: Payer: Self-pay

## 2019-05-07 DIAGNOSIS — I739 Peripheral vascular disease, unspecified: Secondary | ICD-10-CM

## 2019-05-07 NOTE — Progress Notes (Signed)
HISTORY AND PHYSICAL     CC:  follow up. Requesting Provider:  Alroy Dust, [name], MD  HPI: This is a 68 y.o. male who is here today for follow up.  He underwent left EIA stent on 06/18/2018 for LLE claudication by Dr. Carlis Abbott.  He was seen 3 months ago and he had seen some initial improvement after the stent was placed, but then he also underwent a L4-L5 microdiscectomy by Dr. Ronnald Ramp and saw even more continued improvement.  He was happy with his overall progress at that visit.    The pt returns today for follow up.  He states that a couple of weeks ago, he woke up and he was not able to feel his left foot.  He states he does have some tingling in his left foot as well.  He states he has been told he does have neuropathy in his feet.  He does not have any non healing wounds.   He does have some aches in his left thigh.  He states that his legs ache after he walks and does not have any claudication sx.  He states he just got a Husky dog recently and he is doing more walking.    He denies any unilateral weakness or hemiparesis, speech difficulties or amaurosis fugax.   The pt is on a statin for cholesterol management.    The pt is not on an aspirin.    Other AC:  Plavix The pt is on BB, CCB and ACEI for hypertension.  The pt dies have diabetes. Tobacco hx:  remote   Past Medical History:  Diagnosis Date  . Arthritis   . BCC (basal cell carcinoma)   . BPH (benign prostatic hyperplasia)   . CAROTID BRUIT, RIGHT 11/30/2009  . DM 11/30/2009  . GERD (gastroesophageal reflux disease)   . Heart murmur   . History of hiatal hernia   . History of Holter monitoring 2009  . History of kidney stones   . HNP (herniated nucleus pulposus), lumbar    L4-5  . HYPERCHOLESTEROLEMIA 11/30/2009  . HYPERTENSION 11/30/2009  . Kidney stones   . Peripheral vascular disease ([place])   . Right carotid bruit   . SMOKER 11/30/2009  . Stroke ([place]) 2012  . URINARY CALCULUS 11/30/2009    Past Surgical History:  Procedure  Laterality Date  . ABDOMINAL AORTOGRAM W/LOWER EXTREMITY N/A 06/18/2018   Procedure: ABDOMINAL AORTOGRAM W/LOWER EXTREMITY;  Surgeon: Marty Heck, MD;  Location: [facility];  Service: Cardiovascular;  Laterality: N/A;  . arthroscopy of right shoulder Right    Jan 2019, June 2019-- done at [facility], Dr. Ninfa Linden  . BACK SURGERY    . CATARACT EXTRACTION W/ INTRAOCULAR LENS  IMPLANT, BILATERAL    . CYSTOSCOPY    . FACIAL COSMETIC SURGERY  2014  . FINGER SURGERY  2002  . LUMBAR LAMINECTOMY/DECOMPRESSION MICRODISCECTOMY Left 08/01/2018   Procedure: Microdiscectomy - Lumbar four-Lumbar five - left;  Surgeon: Eustace Moore, MD;  Location: [facility];  Service: Neurosurgery;  Laterality: Left;  . LUMBAR LAMINECTOMY/DECOMPRESSION MICRODISCECTOMY Left 10/15/2018   Procedure: Re-do Microdiscectomy - left - Lumbar four-Lumbar five;  Surgeon: Eustace Moore, MD;  Location: [facility];  Service: Neurosurgery;  Laterality: Left;  . NECK SURGERY  1986  . PERIPHERAL VASCULAR INTERVENTION  06/18/2018   Procedure: PERIPHERAL VASCULAR INTERVENTION;  Surgeon: Marty Heck, MD;  Location: [facility];  Service: Cardiovascular;;  Left external iliac  . ROTATOR CUFF  REPAIR Left   . UMBILICAL HERNIA REPAIR      Allergies  Allergen Reactions  . Codeine Nausea Only    Current Outpatient Medications  Medication Sig Dispense Refill  . amLODipine-benazepril (LOTREL) 10-40 MG per capsule Take 1 capsule by mouth daily.      . clopidogrel (PLAVIX) 75 MG tablet Take 1 tablet (75 mg total) by mouth daily. 90 tablet 4  . gabapentin (NEURONTIN) 300 MG capsule Take 1 capsule (300 mg total) by mouth 3 (three) times daily. 270 capsule 4  . hydrochlorothiazide (HYDRODIURIL) 25 MG tablet Take 25 mg by mouth daily.      . metFORMIN (GLUCOPHAGE-XR) 500 MG 24 hr tablet Take 1,000 mg by mouth 2 (two) times daily.    . metoprolol succinate (TOPROL-XL) 50 MG 24 hr tablet Take 50 mg by mouth  daily. Take with or immediately following a meal.    . pantoprazole (PROTONIX) 40 MG tablet Take 40 mg by mouth daily with breakfast.     . pioglitazone (ACTOS) 45 MG tablet TAKE 1 TABLET DAILY (NEED APPOINTMENT FOR FURTHER REFILLS) (Patient taking differently: Take 45 mg by mouth daily. ) 90 tablet 0  . repaglinide (PRANDIN) 2 MG tablet Take 2 mg by mouth 3 (three) times daily before meals.     . rosuvastatin (CRESTOR) 40 MG tablet Take 40 mg by mouth daily.     . TRESIBA FLEXTOUCH 100 UNIT/ML SOPN FlexTouch Pen Inject 60 Units into the skin daily.   12   No current facility-administered medications for this visit.    Family History  Problem Relation Age of Onset  . Diabetes Sister   . Diabetes Mellitus I Sister   . Stroke Father   . Heart disease Father   . CVA Father   . Coronary artery disease Father   . Diabetes Mother   . Heart disease Mother   . Diabetes Mellitus I Mother   . Coronary artery disease Mother   . Coronary artery disease Brother   . Heart disease Brother   . Coronary artery disease Brother     Social History   Socioeconomic History  . Marital status: Married    Spouse name: Luis Lindsey   . Number of children: 3  . Years of education: 54  . Highest education level: Not on file  Occupational History  . Occupation: Truck Education administrator: [employer]  Tobacco Use  . Smoking status: Former Smoker    Years: 40.00    Types: Cigarettes    Quit date: 12/2015    Years since quitting: 3.3  . Smokeless tobacco: Never Used  . Tobacco comment: Quit 12-2012  Substance and Sexual Activity  . Alcohol use: No  . Drug use: No  . Sexual activity: Not on file  Other Topics Concern  . Not on file  Social History Narrative   . Patient drinks 2-3 cups of caffeine daily.    Patient lives at home with his wife Luis Lindsey.    Patient works at [employer].    Education. 12 th grade   Right handed               Social Determinants of Health   Financial Resource Strain:   .  Difficulty of Paying Living Expenses: Not on file  Food Insecurity: No Food Insecurity  . Worried About Charity fundraiser in the Last Year: Never true  . Ran Out of Food in the Last Year: Never true  Transportation Needs:  No Transportation Needs  . Lack of Transportation (Medical): No  . Lack of Transportation (Non-Medical): No  Physical Activity:   . Days of Exercise per Week: Not on file  . Minutes of Exercise per Session: Not on file  Stress:   . Feeling of Stress : Not on file  Social Connections:   . Frequency of Communication with Friends and Family: Not on file  . Frequency of Social Gatherings with Friends and Family: Not on file  . Attends Religious Services: Not on file  . Active Member of Clubs or Organizations: Not on file  . Attends Archivist Meetings: Not on file  . Marital Status: Not on file  Intimate Partner Violence:   . Fear of Current or Ex-Partner: Not on file  . Emotionally Abused: Not on file  . Physically Abused: Not on file  . Sexually Abused: Not on file     REVIEW OF SYSTEMS:   [X]  denotes positive finding, [ ]  denotes negative finding Cardiac  Comments:  Chest pain or chest pressure:    Shortness of breath upon exertion:    Short of breath when lying flat:    Irregular heart rhythm:        Vascular    Pain in calf, thigh, or hip brought on by ambulation:    Pain in feet at night that wakes you up from your sleep:  x   Blood clot in your veins:    Leg swelling:         Pulmonary    Oxygen at home:    Productive cough:     Wheezing:         Neurologic    Sudden weakness in arms or legs:     Sudden numbness in arms or legs:     Sudden onset of difficulty speaking or slurred speech:    Temporary loss of vision in one eye:     Problems with dizziness:  x       Gastrointestinal    Blood in stool:     Vomited blood:         Genitourinary    Burning when urinating:     Blood in urine:        Psychiatric    Major  depression:         Hematologic    Bleeding problems:    Problems with blood clotting too easily:        Skin    Rashes or ulcers:        Constitutional    Fever or chills:      PHYSICAL EXAMINATION:  Today's Vitals   05/08/19 0955  BP: (!) 149/64  Pulse: (!) 51  Resp: 18  Temp: (!) 97.4 F (36.3 C)  TempSrc: Temporal  SpO2: 100%  Weight: 175 lb (79.4 kg)  Height: 5\' 10"  (1.778 m)  PainSc: 9    Body mass index is 25.11 kg/m.   General:  WDWN in NAD; vital signs documented above Gait: Not observed HENT: WNL, normocephalic Pulmonary: normal non-labored breathing , without Rales, rhonchi,  wheezing Cardiac: regular HR, with Murmur; with carotid bruits bilaterally Abdomen: soft, NT, no masses Skin: without rashes Vascular Exam/Pulses:  Right Left  Radial 2+ (normal) 2+ (normal)  Ulnar Unable to palpate  Unable to palpate   Femoral 1+ (weak) 2+ (normal)  Popliteal Unable to palpate  Unable to palpate   DP + doppler signal Faint doppler signal  PT Brisk doppler signal  Monophasic doppler signal  Peroneal Brisk doppler signal absent   Extremities: without ischemic changes, without Gangrene , without cellulitis; without open wounds;  Musculoskeletal: no muscle wasting or atrophy  Neurologic: A&O X 3;  No focal weakness or paresthesias are detected Psychiatric:  The pt has Normal affect.   Non-Invasive Vascular Imaging:   ABI's/TBI's on 05/08/2019: Right:  0.77/0.50 Left:  0.56/0.41   Arterial duplex on 05/08/2019: Left Stent(s): +---------------+---+--------------+--------++ Prox to Stent  2301-49% stenosisbiphasic +---------------+---+--------------+--------++ Proximal Stent 208              biphasic +---------------+---+--------------+--------++ Mid Stent      152              biphasic +---------------+---+--------------+--------++ Distal Stent   198              biphasic +---------------+---+--------------+--------++ Distal  to Stent152              biphasic +---------------+---+--------------+--------++   Summary: Stenosis:  Location                               Stent          +---------------------------------------+--------------+ Left External Iliac (proximal to stent)1-49% stenosis +---------------------------------------+--------------+   Previous ABI's/TBI's on 10/21/2018: Right:  0.66 Left:  0.51  Previous Arterial duplex 10/21/2018; Left Stent(s): +---------------+---+---------------+--------++ Proximal Stent 26750-99% stenosisbiphasic +---------------+---+---------------+--------++ Mid Stent      190               biphasic +---------------+---+---------------+--------++ Distal Stent   211               biphasic +---------------+---+---------------+--------++ Distal to Stent233               biphasic +---------------+---+---------------+--------++  Summary: Stenosis: +-------------------+---------------+ Location           Stent           +-------------------+---------------+ Left External Iliac50-99% stenosis   ASSESSMENT/PLAN:: 68 y.o. male here for follow up for left EIA stent 06/18/2018  -at his visit in May, he was found to have an elevated velocitiy in the proximal stent.  Dr. Carlis Abbott reviewed his arteriogram and discussed with him should he have any issues, he would likely need a left femoral endarterectmy with profundoplast and patch of the proximal SFA stenosis and re-evaluate the Left iliac stent at that time.  He is here today for follow up studies.   PAD -ABI's are essentially the same as back in May.  His left EIA stent is 1-49% stenosis.  He describes numbness in his left foot.  He is diabetic and states he has neuropathy.  He does not have any non healing wounds.  He does not have claudication when walking.  I discussed with him to continue his walking program, which is easier now that he has gotten a dog recently.  I did discuss with  him to keep an eye on his feet daily and avoid heating pads, and hot water soaks. -continue plavix/statin  Carotid Bruits bilaterally -pt has not had any stroke sx and specifically denies unilateral weakness/paresis, amaurosis fugax or speech difficulties.  He does have bilateral carotid bruits.  He does have a heart murmur and this could be radiation from the murmur, but will repeat his carotid duplex at his next visit.  He had a duplex in 2015, however, I am unable to find these results in epic.    Will have pt return in 3 months  with repeat ABI/Aortoiliac duplex and carotid duplex and see Dr. Carlis Abbott.  Discussed with pt to continue walking program, plavix, statin.  He knows should he have any issues or develop new sx related to his PAD or have new neurological sx, to contact us sooner.  He is in agreement with this plan.   Leontine Locket, PA-C [facility] [phone]  Clinic MD:  Donzetta Matters

## 2019-05-08 ENCOUNTER — Ambulatory Visit (INDEPENDENT_AMBULATORY_CARE_PROVIDER_SITE_OTHER): Payer: HMO | Admitting: Physician Assistant

## 2019-05-08 ENCOUNTER — Other Ambulatory Visit: Payer: Self-pay

## 2019-05-08 ENCOUNTER — Ambulatory Visit (HOSPITAL_COMMUNITY)
Admission: RE | Admit: 2019-05-08 | Discharge: 2019-05-08 | Disposition: A | Discharge status: Home / Self Care | Payer: HMO | Source: Ambulatory Visit | Attending: Vascular Surgery | Admitting: Vascular Surgery

## 2019-05-08 ENCOUNTER — Ambulatory Visit (INDEPENDENT_AMBULATORY_CARE_PROVIDER_SITE_OTHER)
Admission: RE | Admit: 2019-05-08 | Discharge: 2019-05-08 | Disposition: A | Discharge status: Home / Self Care | Payer: HMO | Source: Ambulatory Visit | Attending: Vascular Surgery | Admitting: Vascular Surgery

## 2019-05-08 VITALS — BP 149/64 | HR 51 | Temp 97.4°F | Resp 18 | Ht 70.0 in | Wt 175.0 lb

## 2019-05-08 DIAGNOSIS — R0989 Other specified symptoms and signs involving the circulatory and respiratory systems: Secondary | ICD-10-CM

## 2019-05-08 DIAGNOSIS — I739 Peripheral vascular disease, unspecified: Secondary | ICD-10-CM

## 2019-05-08 NOTE — Patient Outreach (Signed)
[place] [facility] [facility]) Care Management Chronic Special Needs Program  05/08/2019  Name: Luis Lindsey DOB: 1950/09/09  MRN: [id number]  Luis Lindsey is enrolled in a chronic special needs plan for Diabetes. Reviewed and updated care plan.  Subjective: Client states that he has been trying to cut back on his portion sizes and watch what he eats.  States he has lost about 20 lbs since June.  States he is checking his blood sugars 1-2 times a day.   States the morning readings range form 90-130 and later in the day from 160-170.  States he has had one low reading of 42 during the night.  States he had not eaten a bedtime snack the night before.  Denies any falls.  States he is now going to physical therapy to strengthen his ankle and he is also walking for 30 minutes most days.  States he is affording his medications without difficulty now.  States he has the Advanced Directives but he has not completed them yet.  States his eye appointment was cancelled earlier in the year and he has not rescheduled yet  Goals Addressed            This Visit's Progress   . COMPLETED:  Acknowledge receipt of Advanced Directive package       Received packet    . COMPLETED: Client understands the importance of follow-up with providers by attending scheduled visits   On track    Reports keeping scheduled appointments    . Client will not report change from baseline and no repeated symptoms of stroke with in the next 9 months   On track    Reports no stroke symptoms    . Client will report no fall or injuries in the next 6 months.   On track    Reports no recent falls    . COMPLETED: Client will use Assistive Devices as needed and verbalize understanding of device use   On track    No issues with glucometer and using as directed    . Client will verbalize knowledge of self management of Hypertension as evidences by BP reading of 140/90 or less; or as defined by provider   On track    Reports B/P  less than 140/90 when checked    . Decrease inpatient admissions/ readmissions with in the next year   On track   . HEMOGLOBIN A1C < 7.0        Diabetes self management actions:  Glucose monitoring per provider recommendations  Perform Quality checks on blood meter  Eat Healthy  Check feet daily  Visit provider every 3-6 months as directed  Hbg A1C level every 3-6 months.  Eye Exam yearly    . Obtain Annual Eye (retinal)  Exam    Not on track    Plan to call and schedule eye exam    . COMPLETED: Obtain Annual Foot Exam       Completed 09/01/18    . COMPLETED: Obtain Hemoglobin A1C at least 2 times per year       Checked 09/10/18, 03/03/19    . COMPLETED: Visit Primary Care Provider or Endocrinologist at least 2 times per year        Completed 09/01/18, 03/03/19     Client is not meeting diabetes self management goal of hemoglobin A1C of <7% with last reading of 11.5% Client continues to be engaged with [employer] scientist (medical) for pharmacy assistance and reports able to get  his insulin at this time with the assistance. Client has received education on Advanced Directives from social work, has the forms but has not completed yet. Encouraged to continue to watch his portion sizes and to follow a low CHO, low sodium diet Reviewed s/s of hypoglycemia and actions to take.  Instructed to be sure to have a light bedtime snack with protein and one CHO serving.  Instructed to call provider if he starts to have frequent hypoglycemia Encouraged to continue to walk daily  Instructed to call and schedule his eye exam Reviewed number for 24-hour nurse Line Reviewed COVID 19 precautions Plan:  Send successful outreach letter with a copy of their individualized care plan and Send individual care plan to provider  Chronic care management coordinator will outreach in:  3-6 Months per tier level     [name] RN, Jackquline Denmark, [place] Management Coordinator [place] Management [phone]

## 2019-05-26 ENCOUNTER — Other Ambulatory Visit: Payer: Self-pay | Admitting: *Deleted

## 2019-05-26 DIAGNOSIS — R0989 Other specified symptoms and signs involving the circulatory and respiratory systems: Secondary | ICD-10-CM

## 2019-05-26 DIAGNOSIS — I739 Peripheral vascular disease, unspecified: Secondary | ICD-10-CM

## 2019-05-28 ENCOUNTER — Other Ambulatory Visit: Payer: Self-pay | Admitting: Pharmacy Technician

## 2019-05-28 NOTE — Patient Outreach (Addendum)
[facility]) Care Management  05/28/2019  SOUA IBARRA January 09, 1951 [id number]   Successful outreach call placed to patient in regards to [employer] application for Tresiba.  Spoke to patient, HIPAA identifiers verified.  Patient informed he has not received the application in the mail that was sent to him on 05/05/2019.  Informed patient I would place another application in the mail on June 03, 2018. Informed him it would come in a HTA envelope. Patient verbalized understanding.  Will followup with patient in 7-10 business days to inquire if he received the 2nd application.  Jasiri Hanawalt P. Isyss Espinal, [place] Management [phone]

## 2019-06-04 DIAGNOSIS — M5416 Radiculopathy, lumbar region: Secondary | ICD-10-CM | POA: Diagnosis not present

## 2019-06-08 ENCOUNTER — Other Ambulatory Visit: Payer: Self-pay | Admitting: Neurological Surgery

## 2019-06-08 DIAGNOSIS — M5416 Radiculopathy, lumbar region: Secondary | ICD-10-CM

## 2019-06-10 ENCOUNTER — Other Ambulatory Visit: Payer: Self-pay | Admitting: Pharmacy Technician

## 2019-06-10 NOTE — Patient Outreach (Signed)
[place] [facility]) Care Management  06/10/2019  Luis Lindsey 03/09/1951 [id number]   Successful call placed to patient regarding patient assistance application(s) for [place] with [employer] , HIPAA identifiers verified.   Patient informed he found the 1st application that was mailed to him on 05/05/2019. He informed he filled out the form and sent back the necessary documents on Monday 06/08/2019. He apologized as the envelope "was in my wife's mail stack". He informed he would shred/discard the 2nd application that was mailed to him.  Follow up:  Will route note to [place] for case closure if document(s) have not been received in the next 15 business days.  Kasidi Shanker P. Deztinee Lohmeyer, [place] Management [phone]

## 2019-06-12 ENCOUNTER — Telehealth: Payer: Self-pay | Admitting: Pharmacist

## 2019-06-12 NOTE — Patient Outreach (Signed)
[facility]) Care Management  06/12/2019  QUINCY GENNUSO 1950-08-15 [id number]  Patient was called to follow up on medication assistance. HIPAA identifiers were obtained.  [employer] has been helping the patient with medication assistance for [place].  However with the new Insulin benefit for 2021, the patient would have a $0 copay for [place] through his insurance( HTA-CSNP) all year. However, the dollar value of Tyler Aas would still count towards him hitting the donut hole.  Patient was given the choice as to wether he wanted to He was asked to send in some additional information: a continue with the patient assistance process or not.  He said he would like to continue.    copy of his insurance card and proof of his household income.  Patient communicated understanding and said he would send in the requested information.  Plan: Route letter to [facility], CPhT to send patient a return address envelope.  Follow up with patient in 6-8 weeks.  Elayne Guerin, PharmD, [place] Clinical Pharmacist [phone]

## 2019-06-14 NOTE — Patient Outreach (Deleted)
[place]) Care Management  06/14/2019  SILVIO HELSLEY [birth year] [id number]   LATE ENTRY FOR 06/12/2019  Patient was called to follow up on medication assistance. HIPAA identifiers were obtained.  [employer] has been helping the patient with medication assistance for [place].  However with the new Insulin benefit for 2021, the patient would have a $0 copay for [place] through his insurance( HTA-CSNP) all year. However, the dollar value of Tyler Aas would still count towards him hitting the donut hole.  Patient was given the choice as to wether he wanted to He was asked to send in some additional information: a continue with the patient assistance process or not.  He said he would like to continue.    copy of his insurance card and proof of his household income.  Patient communicated understanding and said he would send in the requested information.  Plan: Route letter to [facility], CPhT to send patient a return address envelope.  Follow up with patient in 6-8 weeks.  Elayne Guerin, PharmD, [place] Clinical Pharmacist [phone]

## 2019-06-15 ENCOUNTER — Other Ambulatory Visit: Payer: Self-pay | Admitting: Pharmacy Technician

## 2019-06-15 NOTE — Patient Outreach (Signed)
[place] [facility]) Care Management  06/15/2019  Luis Lindsey 03/22/1951 [id number]    Received letter from [place] in regards to patient's application for [place] with [employer].  Prepared return envelope letter to patient in order for patient to send back his proof of income and prescription drug card as required by [employer] application process.  Will followup with patient in 5-10 business days to inquire if he received the information.  Jan Walters P. [name], [place] Management [phone]

## 2019-06-29 ENCOUNTER — Ambulatory Visit: Payer: HMO | Admitting: Pharmacist

## 2019-07-02 ENCOUNTER — Ambulatory Visit
Admission: RE | Admit: 2019-07-02 | Discharge: 2019-07-02 | Disposition: A | Discharge status: Home / Self Care | Payer: HMO | Source: Ambulatory Visit | Attending: Neurological Surgery | Admitting: Neurological Surgery

## 2019-07-02 ENCOUNTER — Other Ambulatory Visit: Payer: Self-pay | Admitting: Pharmacy Technician

## 2019-07-02 DIAGNOSIS — M48061 Spinal stenosis, lumbar region without neurogenic claudication: Secondary | ICD-10-CM | POA: Diagnosis not present

## 2019-07-02 DIAGNOSIS — M5416 Radiculopathy, lumbar region: Secondary | ICD-10-CM

## 2019-07-02 DIAGNOSIS — M4726 Other spondylosis with radiculopathy, lumbar region: Secondary | ICD-10-CM | POA: Diagnosis not present

## 2019-07-02 DIAGNOSIS — M5116 Intervertebral disc disorders with radiculopathy, lumbar region: Secondary | ICD-10-CM | POA: Diagnosis not present

## 2019-07-02 MED ORDER — GADOBENATE DIMEGLUMINE 529 MG/ML IV SOLN
15.0000 mL | Freq: Once | INTRAVENOUS | Status: AC | PRN
  Administered 2019-07-02: 14:00:00 15 mL via INTRAVENOUS

## 2019-07-02 NOTE — Patient Outreach (Signed)
[place] [facility]) Care Management  07/02/2019  SEIYA BORKE [birth year] [id number]   Received both patient and provider portion(s) of patient assistance application(s) for [place]. Faxed completed application and required documents into [employer].  Will follow up with company(ies) in 3-5 business days to check status of application(s).  Richrd Kuzniar P. Moet Mikulski, [place] Management [phone]

## 2019-07-08 ENCOUNTER — Other Ambulatory Visit: Payer: Self-pay | Admitting: Pharmacy Technician

## 2019-07-08 NOTE — Patient Outreach (Signed)
[place] [facility]) Care Management  07/08/2019  Luis Lindsey 1951/04/21 [id number]   Care coordination call placed to [place] in regards to patient's application for Tresiba.  Spoke to [place] who informed based on the income submitted for 1 member of the household and the number of people in the household being 2, then  [employer] will need a denial LIS letter to continue with the application process based on the income listed for a 2 person household.  Caren Griffins also informed the provider's expiration date for his state license number needs to be written on the application as they were unable to verify it electronically.  Will write in provider's expiration date (and resubmit)  and will route note to [place] that patient will have to apply for LIS and the denial letter will have to be submitted to [employer] once patient receives it.  Maryuri Warnke P. Valery Amedee, [place] Management [phone]

## 2019-07-09 ENCOUNTER — Telehealth: Payer: Self-pay | Admitting: Pharmacist

## 2019-07-09 DIAGNOSIS — M5416 Radiculopathy, lumbar region: Secondary | ICD-10-CM | POA: Diagnosis not present

## 2019-07-09 NOTE — Patient Outreach (Signed)
[facility]) Care Management  07/09/2019  Luis Lindsey 19-Oct-1950 [id number]   Patient was called regarding medication assistance. [employer] is requiring the patient to apply for LIS/Extra Help to be approved for their program.  Purpose of today's call was to complete an Extra Help Application with the patient online.  Unfortunately,he did not answer his phone. HIPAA compliant message was left on his voicemail.  Plan: Call patient back in 3-5 business days. Send unsuccessful contact letter.  Elayne Guerin, PharmD, [place] Clinical Pharmacist [phone]

## 2019-07-15 ENCOUNTER — Other Ambulatory Visit: Payer: Self-pay | Admitting: Pharmacist

## 2019-07-15 NOTE — Patient Outreach (Signed)
[place] [facility]) Care Management  07/15/2019  Luis Lindsey Oct 19, 1950 [id number]   Patient was called to complete an Extra Help Application as was required by [employer]. HIPAA identifiers were obtained.  Extra Help application was completed online with the patient over the phone.  Plan: Send patient a return-addressed envelope so he can send the LIS/Extra Help Determination Letter back to [place] to be faxed to [employer].  Elayne Guerin, PharmD, [place] Clinical Pharmacist [phone]

## 2019-07-17 ENCOUNTER — Other Ambulatory Visit: Payer: Self-pay | Admitting: Pharmacy Technician

## 2019-07-17 NOTE — Patient Outreach (Signed)
[place] [facility]) Care Management  07/17/2019  Luis Lindsey 1950/07/26 [id number]    Care coordination call placed to [name] in regards to patient's application for Tresiba.  Spoke to [name] who informed they have received the updated provider information. She was able to attach it to patient's file and ti was accepted. She informed once they receive the denial LIS letter from the patient then the processing can resume. She advised to place patient's id number on the cover sheet when faxing in the information. His id number is [id number].  Will follow up with patient in 15-20 business days to inquire about the LIS letter as the application was completed online with the assistance of [place] on 07/15/2019.  [name], [place] Management [phone]

## 2019-07-30 ENCOUNTER — Ambulatory Visit: Payer: HMO | Admitting: Pharmacist

## 2019-08-05 ENCOUNTER — Telehealth: Payer: Self-pay | Admitting: *Deleted

## 2019-08-05 DIAGNOSIS — M5416 Radiculopathy, lumbar region: Secondary | ICD-10-CM | POA: Diagnosis not present

## 2019-08-05 NOTE — Telephone Encounter (Signed)
I called and left message for Dr. Clydell Hakim surgery scheduler. Left message I believe we may have received this fax in error as it is address to a Dr. Carlis Abbott as well as requesting clearance for holding Plavix x 7 days prior to procedure. We have not seen this pt in our office not in the hospital as well. I left message pt see's Dr. Monica Martinez with [facility]. I leave message if they are requesting for the pt to be seen by our office as well this will then require a new pt appt. I did ask for a call back to please confirm either we need to set up a new pt appt or this was meant for Dr. Monica Martinez with [facility].

## 2019-08-07 ENCOUNTER — Other Ambulatory Visit: Payer: Self-pay

## 2019-08-07 NOTE — Patient Outreach (Signed)
  [place] [facility]) Care Management Chronic Special Needs Program  08/07/2019  Name: DAMIANO RINEER DOB: 1951/05/25  MRN: [id number]  Mr. Levy Bayles is enrolled in a chronic special needs plan for Diabetes. Client called with no answer No answer and unable to leave HIPAA compliant message. Plan for 2nd outreach call in one week Chronic care management coordinator will attempt outreach in one week.   [name] RN, Jackquline Denmark, CDE Chronic Care Management Coordinator [facility] Care Management [phone]

## 2019-08-07 NOTE — Telephone Encounter (Signed)
I left message x 2 to please call our office and confirm if this clearance request was faxed to our office in error or if this pt needing to be seen by our office, if so then pt will need New Pt appt.

## 2019-08-07 NOTE — Telephone Encounter (Signed)
I did request a call back Monday 08/10/19 as their office closed today.

## 2019-08-10 ENCOUNTER — Telehealth (HOSPITAL_COMMUNITY): Payer: Self-pay

## 2019-08-10 NOTE — Telephone Encounter (Signed)

## 2019-08-10 NOTE — Telephone Encounter (Signed)
Left message x 3 to please confirm if we need to either set up a New Pt appt for this pt or that this was sent in error to our office. Clearance request is addressed to Dr. Carlis Abbott who is with [facility].

## 2019-08-11 ENCOUNTER — Other Ambulatory Visit: Payer: Self-pay

## 2019-08-11 ENCOUNTER — Ambulatory Visit (INDEPENDENT_AMBULATORY_CARE_PROVIDER_SITE_OTHER)
Admission: RE | Admit: 2019-08-11 | Discharge: 2019-08-11 | Disposition: A | Discharge status: Home / Self Care | Payer: HMO | Source: Ambulatory Visit | Attending: Vascular Surgery | Admitting: Vascular Surgery

## 2019-08-11 ENCOUNTER — Encounter: Payer: Self-pay | Admitting: Vascular Surgery

## 2019-08-11 ENCOUNTER — Ambulatory Visit (INDEPENDENT_AMBULATORY_CARE_PROVIDER_SITE_OTHER): Payer: HMO | Admitting: Vascular Surgery

## 2019-08-11 ENCOUNTER — Ambulatory Visit (HOSPITAL_COMMUNITY)
Admission: RE | Admit: 2019-08-11 | Discharge: 2019-08-11 | Disposition: A | Discharge status: Home / Self Care | Payer: HMO | Source: Ambulatory Visit | Attending: Vascular Surgery | Admitting: Vascular Surgery

## 2019-08-11 VITALS — BP 153/73 | HR 72 | Temp 97.8°F | Resp 16 | Ht 70.0 in | Wt 179.0 lb

## 2019-08-11 DIAGNOSIS — I779 Disorder of arteries and arterioles, unspecified: Secondary | ICD-10-CM | POA: Insufficient documentation

## 2019-08-11 DIAGNOSIS — I739 Peripheral vascular disease, unspecified: Secondary | ICD-10-CM

## 2019-08-11 DIAGNOSIS — R0989 Other specified symptoms and signs involving the circulatory and respiratory systems: Secondary | ICD-10-CM | POA: Insufficient documentation

## 2019-08-11 DIAGNOSIS — I6523 Occlusion and stenosis of bilateral carotid arteries: Secondary | ICD-10-CM | POA: Diagnosis not present

## 2019-08-11 NOTE — Progress Notes (Signed)
Patient name: Luis Lindsey MRN: [id number] DOB: 1951/01/30 Sex: male  REASON FOR VISIT: 18-month follow-up for surveillance of left iliac stent in setting of claudciation and carotid duplex for carotid surveillance  HPI: Luis Lindsey is a 69 y.o. male presents for 57-month follow-up for surveillance of left iliac stent and carotid duplex.  He previously underwent left external iliac stent on 06/18/2018 for left lower extremity lifestyle limiting claudication.  Patient states he saw some initial improvement after the stent was placed but then he also underwent L4-L5 microdiscectomy with Dr. Ronnald Ramp and has seen even more continued improvement.  Over the past 3 months, he reports he gets a little tightness in his left calf but this is very tolerable.  States his back started bothering him again at the end of last year and is due for an injection.  No problems with the right leg.  He was also scheduled for carotid duplex today given carotid bruit was appreciated by the PA on his last visit.  He thinks he had a stroke in 2012 but does not remember if he has any deficits from that.  He states he is fully neurologically intact at this time.  No neurologic deficits over the last 6 months.  Takes plavix daily for PVD.  No neck radiation.  No neck surgery other than posterior cervical operation.  Past Medical History:  Diagnosis Date  . Arthritis   . BCC (basal cell carcinoma)   . BPH (benign prostatic hyperplasia)   . CAROTID BRUIT, RIGHT 11/30/2009  . DM 11/30/2009  . GERD (gastroesophageal reflux disease)   . Heart murmur   . History of hiatal hernia   . History of Holter monitoring 2009  . History of kidney stones   . HNP (herniated nucleus pulposus), lumbar    L4-5  . HYPERCHOLESTEROLEMIA 11/30/2009  . HYPERTENSION 11/30/2009  . Kidney stones   . Peripheral vascular disease ([place])   . Right carotid bruit   . SMOKER 11/30/2009  . Stroke ([place]) 2012  . URINARY CALCULUS 11/30/2009    Past Surgical  History:  Procedure Laterality Date  . ABDOMINAL AORTOGRAM W/LOWER EXTREMITY N/A 06/18/2018   Procedure: ABDOMINAL AORTOGRAM W/LOWER EXTREMITY;  Surgeon: Marty Heck, MD;  Location: [place] [facility];  Service: Cardiovascular;  Laterality: N/A;  . arthroscopy of right shoulder Right    Jan 2019, June 2019-- done at [place], Dr. Ninfa Linden  . BACK SURGERY    . CATARACT EXTRACTION W/ INTRAOCULAR LENS  IMPLANT, BILATERAL    . CYSTOSCOPY    . FACIAL COSMETIC SURGERY  2014  . FINGER SURGERY  2002  . LUMBAR LAMINECTOMY/DECOMPRESSION MICRODISCECTOMY Left 08/01/2018   Procedure: Microdiscectomy - Lumbar four-Lumbar five - left;  Surgeon: Eustace Moore, MD;  Location: [facility];  Service: Neurosurgery;  Laterality: Left;  . LUMBAR LAMINECTOMY/DECOMPRESSION MICRODISCECTOMY Left 10/15/2018   Procedure: Re-do Microdiscectomy - left - Lumbar four-Lumbar five;  Surgeon: Eustace Moore, MD;  Location: [place];  Service: Neurosurgery;  Laterality: Left;  . NECK SURGERY  1986  . PERIPHERAL VASCULAR INTERVENTION  06/18/2018   Procedure: PERIPHERAL VASCULAR INTERVENTION;  Surgeon: Marty Heck, MD;  Location: [place] [facility];  Service: Cardiovascular;;  Left external iliac  . ROTATOR CUFF REPAIR Left   . UMBILICAL HERNIA REPAIR      Family History  Problem Relation Age of Onset  . Diabetes Sister   . Diabetes Mellitus I Sister   . Stroke Father   .  Heart disease Father   . CVA Father   . Coronary artery disease Father   . Diabetes Mother   . Heart disease Mother   . Diabetes Mellitus I Mother   . Coronary artery disease Mother   . Coronary artery disease Brother   . Heart disease Brother   . Coronary artery disease Brother     SOCIAL HISTORY: Social History   Tobacco Use  . Smoking status: Former Smoker    Years: 40.00    Types: Cigarettes    Quit date: 12/2015    Years since quitting: 3.6  . Smokeless tobacco: Never Used  . Tobacco comment: Quit 12-2012    Substance Use Topics  . Alcohol use: No    Allergies  Allergen Reactions  . Liraglutide Nausea And Vomiting    REACTION: vomiting  . Codeine Nausea Only    Current Outpatient Medications  Medication Sig Dispense Refill  . amLODipine-benazepril (LOTREL) 10-40 MG per capsule Take 1 capsule by mouth daily.      . clopidogrel (PLAVIX) 75 MG tablet Take 1 tablet (75 mg total) by mouth daily. 90 tablet 4  . gabapentin (NEURONTIN) 300 MG capsule Take 1 capsule (300 mg total) by mouth 3 (three) times daily. 270 capsule 4  . hydrochlorothiazide (HYDRODIURIL) 25 MG tablet Take 25 mg by mouth daily.      . metFORMIN (GLUCOPHAGE-XR) 500 MG 24 hr tablet Take 1,000 mg by mouth 2 (two) times daily.    . metoprolol succinate (TOPROL-XL) 50 MG 24 hr tablet Take 50 mg by mouth daily. Take with or immediately following a meal.    . pantoprazole (PROTONIX) 40 MG tablet Take 40 mg by mouth daily with breakfast.     . pioglitazone (ACTOS) 45 MG tablet TAKE 1 TABLET DAILY (NEED APPOINTMENT FOR FURTHER REFILLS) (Patient taking differently: Take 45 mg by mouth daily. ) 90 tablet 0  . repaglinide (PRANDIN) 2 MG tablet Take 2 mg by mouth 3 (three) times daily before meals.     . rosuvastatin (CRESTOR) 40 MG tablet Take 40 mg by mouth daily.     . TRESIBA FLEXTOUCH 100 UNIT/ML SOPN FlexTouch Pen Inject 60 Units into the skin daily.   12   No current facility-administered medications for this visit.    REVIEW OF SYSTEMS:  [X]  denotes positive finding, [ ]  denotes negative finding Cardiac  Comments:  Chest pain or chest pressure:    Shortness of breath upon exertion:    Short of breath when lying flat:    Irregular heart rhythm:        Vascular    Pain in calf, thigh, or hip brought on by ambulation:    Pain in feet at night that wakes you up from your sleep:     Blood clot in your veins:    Leg swelling:         Pulmonary    Oxygen at home:    Productive cough:     Wheezing:         Neurologic     Sudden weakness in arms or legs:     Sudden numbness in arms or legs:     Sudden onset of difficulty speaking or slurred speech:    Temporary loss of vision in one eye:     Problems with dizziness:         Gastrointestinal    Blood in stool:     Vomited blood:  Genitourinary    Burning when urinating:     Blood in urine:        Psychiatric    Major depression:         Hematologic    Bleeding problems:    Problems with blood clotting too easily:        Skin    Rashes or ulcers:        Constitutional    Fever or chills:      PHYSICAL EXAM: Vitals:   08/11/19 1004 08/11/19 1009  BP: (!) 153/72 (!) 153/73  Pulse: 74 72  Resp: 16   Temp: 97.8 F (36.6 C)   TempSrc: Temporal   SpO2: 100%   Weight: 179 lb (81.2 kg)   Height: 5\' 10"  (1.778 m)     GENERAL: The patient is a well-nourished male, in no acute distress. The vital signs are documented above. CARDIAC: There is a regular rate and rhythm.  VASCULAR:  Palpable femoral pulse palpable bilateral groins Right PT palpable No left pedal pulses palpable PULMONARY: There is good air exchange bilaterally without wheezing or rales. ABDOMEN: Soft and non-tender with normal pitched bowel sounds.  MUSCULOSKELETAL: There are no major deformities or cyanosis. NEUROLOGIC: No focal weakness or paresthesias are detected.   DATA:   Aortoiliac duplex shows a patent left external iliac artery stent.  ABIs are 0.8 on the right biphasic and 0.61 on the left monophasic  Carotid duplex shows high-grade greater than 80% stenosis in the right ICA proximally with velocity 642/132 there is evidence of disease extending into the mid distal ICA.  Assessment/Plan:  70 year old male that initially presented with left lower extremity short distance claudication and underwent left external iliac stent early last year.  Overall the symptoms in the left lower extremity are significantly improved and has had no progression.  The left  external iliac stent looks patent on duplex today.  His carotid disease has progressed significantly.  He now has evidence of an asymptomatic high-grade right ICA stenosis more than 80%.  I have subsequently recommended right carotid endarterectomy after review of his carotid duplex.  Given evidence of disease extending to the mid distal ICA based on duplex, I am geting a CTA neck just to ensure that we can find a distal clamp site safely during surgery.  We will schedule him for Monday, 08/24/2019 for right carotid endarterectomy.  Risk and benefits were discussed in detail as well as risks of 1% perioperative stroke bleeding infection etc.  Discussed the goal of surgery is reduce his overall risk of stroke long-term.   Marty Heck, MD [facility] [place] Office: [place]

## 2019-08-14 ENCOUNTER — Other Ambulatory Visit: Payer: Self-pay

## 2019-08-14 ENCOUNTER — Other Ambulatory Visit: Payer: Self-pay | Admitting: Pharmacist

## 2019-08-14 NOTE — Patient Outreach (Signed)
  [place] [facility]) Care Management Chronic Special Needs Program  08/14/2019  Name: Luis Lindsey DOB: [birth year]  MRN: [id number]  [name] is enrolled in a chronic special needs plan for Diabetes. Client called with no answer No answer and unable to leave HIPAA compliant message. Plan for 3rd outreach call in 1-2  weeks Chronic care management coordinator will attempt outreach in 1-2 weeks.   [name] RN, Jackquline Denmark, CDE Chronic Care Management Coordinator [facility] Care Management [phone]

## 2019-08-14 NOTE — Patient Outreach (Addendum)
[facility] [facility]) Care Management  08/14/2019  NAMON CRANSTON 03-09-1951 [id number]   Patient's case is being closed as the [place] Team has transitioned to the Quality Department and will no longer document in [facility].  Patient will continue to be followed by Susy Frizzle, CPhT for medication assistance.  Elayne Guerin, PharmD, [place] Clinical Pharmacist [phone]

## 2019-08-17 DIAGNOSIS — M5416 Radiculopathy, lumbar region: Secondary | ICD-10-CM | POA: Diagnosis not present

## 2019-08-17 NOTE — Progress Notes (Signed)
[id number] [address], [place] [address] Phone: [phone] Fax: [phone]    Your procedure is scheduled on Monday, March 29th.  Report to [facility] Main Entrance "A" at 7:45 A.M., and check in at the Admitting office.  Call this number if you have problems the morning of surgery:  [phone]  Call [phone] if you have any questions prior to your surgery date Monday-Friday 8am-4pm   Remember:  Do not eat or drink after midnight the night before your surgery   Take these medicines the morning of surgery with A SIP OF WATER  gabapentin (NEURONTIN)  metoprolol succinate (TOPROL-XL)  pantoprazole (PROTONIX) rosuvastatin (CRESTOR)  Follow your surgeon's instructions on when to stop clopidogrel (PLAVIX).  If no instructions were given by your surgeon then you will need to call the office to get those instructions.     As of today, stop taking all Aspirin (unless instructed by your doctor) and other Aspirin containing products, Vitamins, Fish Oils, and Herbal Medications. Also stop all NSAIDS i.e. Advil, Ibuprofen, Motrin, Aleve, Anaprox, Naproxen, BC, Goody Powders, and all Supplements.   WHAT DO I DO ABOUT MY DIABETES MEDICATION?  . DO NOT  take metFORMIN (GLUCOPHAGE-XR), pioglitazone (ACTOS), repaglinide (PRANDIN)/oral diabetes medicines (pills) the morning of surgery. .  The MORNING of surgery DO NOT take SOLIQUA insulin HOW TO MANAGE YOUR DIABETES BEFORE AND AFTER SURGERY  Why is it important to control my blood sugar before and after surgery? . Improving blood sugar levels before and after surgery helps healing and can limit problems. . A way of improving blood sugar control is eating a healthy diet by: o  Eating less sugar and carbohydrates o  Increasing activity/exercise o  Talking with your doctor about reaching your blood sugar goals . High blood sugars (greater than  180 mg/dL) can raise your risk of infections and slow your recovery, so you will need to focus on controlling your diabetes during the weeks before surgery. . Make sure that the doctor who takes care of your diabetes knows about your planned surgery including the date and location.  How do I manage my blood sugar before surgery? . Check your blood sugar at least 4 times a day, starting 2 days before surgery, to make sure that the level is not too high or low. . Check your blood sugar the morning of your surgery when you wake up and every 2 hours until you get to the Short Stay unit. o If your blood sugar is less than 70 mg/dL, you will need to treat for low blood sugar: - Do not take insulin. - Treat a low blood sugar (less than 70 mg/dL) with  cup of clear juice (cranberry or apple), 4 glucose tablets, OR glucose gel. - Recheck blood sugar in 15 minutes after treatment (to make sure it is greater than 70 mg/dL). If your blood sugar is not greater than 70 mg/dL on recheck, call [phone] for further instructions. . Report your blood sugar to the short stay nurse when you get to Short Stay.  . If you are admitted to the hospital after surgery: o Your blood sugar will be checked by the staff and you will probably be given insulin after surgery (instead of oral diabetes medicines) to make sure you have good blood sugar levels. o The goal for blood sugar control after surgery is 80-180 mg/dL.  No Smoking of any kind, Tobacco,  or Alcohol products 24 hours prior to your procedure.  If you use a CPAP at night, you may bring all equipment for your overnight stay.                        Do not wear jewelry.            Do not wear lotions, powders, colognes, or deodorant.            Men may shave face and neck.            Do not bring valuables to the hospital.            [facility] is not responsible for any belongings or valuables.   Contacts, glasses, dentures or bridgework may not be worn  into surgery.      For patients admitted to the hospital, discharge time will be determined by your treatment team.   Patients discharged the day of surgery will not be allowed to drive home, and someone needs to stay with them for 24 hours.  Special instructions:   [place]- Preparing For Surgery  Before surgery, you can play an important role. Because skin is not sterile, your skin needs to be as free of germs as possible. You can reduce the number of germs on your skin by washing with CHG (chlorahexidine gluconate) Soap before surgery.  CHG is an antiseptic cleaner which kills germs and bonds with the skin to continue killing germs even after washing.    Oral Hygiene is also important to reduce your risk of infection.  Remember - BRUSH YOUR TEETH THE MORNING OF SURGERY WITH YOUR REGULAR TOOTHPASTE  Please do not use if you have an allergy to CHG or antibacterial soaps. If your skin becomes reddened/irritated stop using the CHG.  Do not shave (including legs and underarms) for at least 48 hours prior to first CHG shower. It is OK to shave your face.  Please follow these instructions carefully.   1. Shower the NIGHT BEFORE SURGERY and the MORNING OF SURGERY with CHG Soap.   2. If you chose to wash your hair, wash your hair first as usual with your normal shampoo.  3. After you shampoo, rinse your hair and body thoroughly to remove the shampoo.  4. Use CHG as you would any other liquid soap. You can apply CHG directly to the skin and wash gently with a scrungie or a clean washcloth.   5. Apply the CHG Soap to your body ONLY FROM THE NECK DOWN.  Do not use on open wounds or open sores. Avoid contact with your eyes, ears, mouth and genitals (private parts). Wash Face and genitals (private parts)  with your normal soap.   6. Wash thoroughly, paying special attention to the area where your surgery will be performed.  7. Thoroughly rinse your body with warm water from the neck  down.  8. DO NOT shower/wash with your normal soap after using and rinsing off the CHG Soap.  9. Pat yourself dry with a CLEAN TOWEL.  10. Wear CLEAN PAJAMAS to bed the night before surgery, wear comfortable clothes the morning of surgery  11. Place CLEAN SHEETS on your bed the night of your first shower and DO NOT SLEEP WITH PETS.  Day of Surgery: Do not apply any deodorants/lotions.  Please wear clean clothes to the hospital/surgery center.   Remember to brush your teeth WITH YOUR REGULAR TOOTHPASTE.   Please read  over the following fact sheets that you were given.

## 2019-08-18 ENCOUNTER — Encounter (HOSPITAL_COMMUNITY)
Admission: RE | Admit: 2019-08-18 | Discharge: 2019-08-18 | Disposition: A | Discharge status: Home / Self Care | Payer: HMO | Source: Ambulatory Visit | Attending: Vascular Surgery | Admitting: Vascular Surgery

## 2019-08-18 ENCOUNTER — Encounter (HOSPITAL_COMMUNITY): Payer: Self-pay

## 2019-08-18 ENCOUNTER — Other Ambulatory Visit: Payer: Self-pay

## 2019-08-18 DIAGNOSIS — M1990 Unspecified osteoarthritis, unspecified site: Secondary | ICD-10-CM | POA: Insufficient documentation

## 2019-08-18 DIAGNOSIS — Z7902 Long term (current) use of antithrombotics/antiplatelets: Secondary | ICD-10-CM | POA: Insufficient documentation

## 2019-08-18 DIAGNOSIS — Z7984 Long term (current) use of oral hypoglycemic drugs: Secondary | ICD-10-CM | POA: Insufficient documentation

## 2019-08-18 DIAGNOSIS — E1151 Type 2 diabetes mellitus with diabetic peripheral angiopathy without gangrene: Secondary | ICD-10-CM | POA: Diagnosis not present

## 2019-08-18 DIAGNOSIS — Z79899 Other long term (current) drug therapy: Secondary | ICD-10-CM | POA: Diagnosis not present

## 2019-08-18 DIAGNOSIS — Z85828 Personal history of other malignant neoplasm of skin: Secondary | ICD-10-CM | POA: Insufficient documentation

## 2019-08-18 DIAGNOSIS — Z9842 Cataract extraction status, left eye: Secondary | ICD-10-CM | POA: Insufficient documentation

## 2019-08-18 DIAGNOSIS — I10 Essential (primary) hypertension: Secondary | ICD-10-CM | POA: Diagnosis not present

## 2019-08-18 DIAGNOSIS — Z961 Presence of intraocular lens: Secondary | ICD-10-CM | POA: Diagnosis not present

## 2019-08-18 DIAGNOSIS — I440 Atrioventricular block, first degree: Secondary | ICD-10-CM | POA: Diagnosis not present

## 2019-08-18 DIAGNOSIS — Z8673 Personal history of transient ischemic attack (TIA), and cerebral infarction without residual deficits: Secondary | ICD-10-CM | POA: Diagnosis not present

## 2019-08-18 DIAGNOSIS — Z01818 Encounter for other preprocedural examination: Secondary | ICD-10-CM | POA: Diagnosis not present

## 2019-08-18 DIAGNOSIS — Z87891 Personal history of nicotine dependence: Secondary | ICD-10-CM | POA: Diagnosis not present

## 2019-08-18 DIAGNOSIS — Z87442 Personal history of urinary calculi: Secondary | ICD-10-CM | POA: Insufficient documentation

## 2019-08-18 DIAGNOSIS — Z9841 Cataract extraction status, right eye: Secondary | ICD-10-CM | POA: Insufficient documentation

## 2019-08-18 DIAGNOSIS — K219 Gastro-esophageal reflux disease without esophagitis: Secondary | ICD-10-CM | POA: Diagnosis not present

## 2019-08-18 DIAGNOSIS — I6521 Occlusion and stenosis of right carotid artery: Secondary | ICD-10-CM | POA: Insufficient documentation

## 2019-08-18 LAB — CBC
HCT: 40.3 % (ref 39.0–52.0)
Hemoglobin: 12.9 g/dL — ABNORMAL LOW (ref 13.0–17.0)
MCH: 28.9 pg (ref 26.0–34.0)
MCHC: 32 g/dL (ref 30.0–36.0)
MCV: 90.2 fL (ref 80.0–100.0)
Platelets: 333 10*3/uL (ref 150–400)
RBC: 4.47 MIL/uL (ref 4.22–5.81)
RDW: 14.9 % (ref 11.5–15.5)
WBC: 8.6 10*3/uL (ref 4.0–10.5)
nRBC: 0 % (ref 0.0–0.2)

## 2019-08-18 LAB — COMPREHENSIVE METABOLIC PANEL
ALT: 28 U/L (ref 0–44)
AST: 25 U/L (ref 15–41)
Albumin: 3.9 g/dL (ref 3.5–5.0)
Alkaline Phosphatase: 86 U/L (ref 38–126)
Anion gap: 13 (ref 5–15)
BUN: 21 mg/dL (ref 8–23)
CO2: 26 mmol/L (ref 22–32)
Calcium: 9.9 mg/dL (ref 8.9–10.3)
Chloride: 100 mmol/L (ref 98–111)
Creatinine, Ser: 0.85 mg/dL (ref 0.61–1.24)
GFR calc Af Amer: >60 mL/min (ref >60)
GFR calc non Af Amer: >60 mL/min (ref >60)
Glucose, Bld: 70 mg/dL (ref 70–99)
Potassium: 3.9 mmol/L (ref 3.5–5.1)
Sodium: 139 mmol/L (ref 135–145)
Total Bilirubin: 0.9 mg/dL (ref 0.3–1.2)
Total Protein: 7.9 g/dL (ref 6.5–8.1)

## 2019-08-18 LAB — ABO/RH: ABO/RH(D): O POS

## 2019-08-18 LAB — SURGICAL PCR SCREEN
MRSA, PCR: NEGATIVE
Staphylococcus aureus: NEGATIVE

## 2019-08-18 LAB — GLUCOSE, CAPILLARY: Glucose-Capillary: 143 mg/dL — ABNORMAL HIGH (ref 70–99)

## 2019-08-18 LAB — TYPE AND SCREEN
ABO/RH(D): O POS
Antibody Screen: NEGATIVE

## 2019-08-18 LAB — HEMOGLOBIN A1C
Hgb A1c MFr Bld: 9.2 % — ABNORMAL HIGH (ref 4.8–5.6)
Mean Plasma Glucose: 217.34 mg/dL

## 2019-08-18 LAB — PROTIME-INR
INR: 0.9 (ref 0.8–1.2)
Prothrombin Time: 12.5 seconds (ref 11.4–15.2)

## 2019-08-18 LAB — APTT: aPTT: 33 seconds (ref 24–36)

## 2019-08-18 NOTE — Progress Notes (Signed)
PCP - Dr. [name] L. Alroy Dust Cardiologist - denies  PPM/ICD - denies  Chest x-ray -  N/A EKG - 08/18/2019 Stress Test - per patient "one done in 2014 for work, no abnormal results" ECHO - denies Cardiac Cath - denies  Sleep Study - denies CPAP - N/A  Fasting Blood Sugar - 120 - 160s Checks Blood Sugar 1 time/day  Blood Thinner Instructions: PLAVIX: per patient "last dose 08/09/2019, had to stop taking one week prior to back injections received yesterday, and one week before surgery". Aspirin Instructions: N/A  ERAS Protcol - No  COVID TEST- Scheduled for 08/21/2019. Patient verbalized understanding of self-quarantine instructions, appointment time and place.  Anesthesia review: YES, A1C 9.2  Patient denies shortness of breath, fever, cough and chest pain at PAT appointment   All instructions explained to the patient, with a verbal understanding of the material. Patient agrees to go over the instructions while at home for a better understanding. Patient also instructed to self quarantine after being tested for COVID-19. The opportunity to ask questions was provided.

## 2019-08-19 ENCOUNTER — Ambulatory Visit (HOSPITAL_COMMUNITY)
Admission: RE | Admit: 2019-08-19 | Discharge: 2019-08-19 | Disposition: A | Discharge status: Home / Self Care | Payer: HMO | Source: Ambulatory Visit | Attending: Vascular Surgery | Admitting: Vascular Surgery

## 2019-08-19 ENCOUNTER — Other Ambulatory Visit: Payer: Self-pay | Admitting: Pharmacy Technician

## 2019-08-19 DIAGNOSIS — I6523 Occlusion and stenosis of bilateral carotid arteries: Secondary | ICD-10-CM | POA: Insufficient documentation

## 2019-08-19 MED ORDER — IOHEXOL 350 MG/ML SOLN
60.0000 mL | Freq: Once | INTRAVENOUS | Status: AC | PRN
  Administered 2019-08-19: 60 mL via INTRAVENOUS

## 2019-08-19 NOTE — Progress Notes (Addendum)
Anesthesia Chart Review:  Case: [id number] Date/Time: 08/24/19 0932   Procedure: ENDARTERECTOMY CAROTID (Right )   Anesthesia type: General   Pre-op diagnosis: RIGHT CAROTID STENOSIS   Location: [facility] OR ROOM 12 / [place] OR   Surgeons: Marty Heck, MD      DISCUSSION: Patient is a 69 year old male scheduled for the above procedure.He is s/p left L4-5 microdiscectomy on 08/01/18 and was discharged home that same day.  History includes former smoker (quit '17), HTN, DM2,PAD (s/p left EIA stent 06/18/18),CVA '12, carotid artery stenosis, murmur ("Grade 2/6 SEM at [facility]" per 02/17/18 exam byMitchell, [name], MD), facial cosmetic surgery (not specified) '14, hiatal hernia, back surgery (s/p left L4-5 microdiscectomy 08/01/18 with redo 10/15/18).  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 PCP classifiedmurmur as 2/6 SEM in 2019. Will attempt to get most recent visit from Dr. Alroy Dust. (UPDATE 08/20/19 3:30 PM: 03/03/19 note received, but was telemedicine. A1c was 11.5% then, so he has had some improvement with DM control. No echo on file at [facility].) No CV symptoms reported at his PAT RN visit. Chart forwarded for anesthesia APP review after patient had left PAT visit.   Plavix started after 05/2018 left EIA stent. Last dose 08/09/19 (held for surgery and for back injection that he received on 08/17/19).    He is followed through [facility] [place], CPhT) for medication assistance. He is also enrolled in a chornic special needs plan for Diabetes through [facility], but appears he has not answered calls to follow-up within the last few weeks. A1c elevated at 9.2%, consistent with average glucose of 217.34. He reported home fasting CBGs ~ 120-160s. This was routed to Dr. Carlis Abbott and Festus Barren at [facility]--Dr. Carlis Abbott plans to proceed given this  is not elective surgery with string sign right ICA on CTA.  Pre-surgical COVID-19 test is scheduled for 08/21/19.  He cannot provide a urine specimen at PAT so urinalysis will need to be done on the day of surgery.   VS: BP (!) 160/56   Pulse (!) 59   Temp 37 C (Oral)   Resp 18   Ht 5\' 10"  (1.778 m)   Wt 82.7 kg   SpO2 100%   BMI 26.15 kg/m    PROVIDERS: Alroy Dust, [name], MDis PCP [facility]) - Marcial Pacas, MD is neurologist. Last visit 11/24/18 - Sherley Bounds, MD is neurosurgeon - He is not followed by cardiology, but had an ETT by Candee Furbish, MD in 2012   LABS: Preoperative labs noted. A1c 9.2%, up from 8.9% on 07/25/18.  (all labs ordered are listed, but only abnormal results are displayed)  Labs Reviewed  GLUCOSE, CAPILLARY - Abnormal; Notable for the following components:      Result Value   Glucose-Capillary 143 (*)    All other components within normal limits  CBC - Abnormal; Notable for the following components:   Hemoglobin 12.9 (*)    All other components within normal limits  HEMOGLOBIN A1C - Abnormal; Notable for the following components:   Hgb A1c MFr Bld 9.2 (*)    All other components within normal limits  SURGICAL PCR SCREEN  APTT  COMPREHENSIVE METABOLIC PANEL  PROTIME-INR  TYPE AND SCREEN  ABO/RH     IMAGES: CTA neck 08/19/19: IMPRESSION: 1. Atherosclerotic disease within the visualized aortic arch and major branch  vessels of the neck as described, and most notably as follows. 2. Prominent mixed plaque within the proximal right ICA. Resultant focal high-grade stenosis of the proximal right ICA with radiographic string sign. 3. Mixed plaque results in high-grade stenosis at the origin of the left common carotid artery. 4. Prominent calcified plaque within the proximal left ICA with up to 40% stenosis. 5. Calcified plaque within the V4 segment of the dominant right vertebral artery with sites of moderate stenosis. 6. Sites of  severe atherosclerotic narrowing within the V1 and V2 segments of the non-dominant left vertebral artery. Moderate atherosclerotic narrowing of the V4 segment.   EKG:08/18/19: Sinus rhythm with 1st degree A-V block Otherwise normal ECG No significant change since last tracing Confirmed by Ena Dawley [phone]) on 08/18/2019 10:57:42 PM   CV: Carotid US 08/11/19: Summary:  Right Carotid: Velocities in the right ICA are consistent with a 80-99%         stenosis.  Left Carotid: Velocities in the left ICA are consistent with a 1-39%  stenosis.  Vertebrals: Bilateral vertebral arteries demonstrate antegrade flow.  Subclavians: Normal flow hemodynamics were seen in bilateral subclavian        arteries.    ETT 05/11/11 ([facility]):  Summary:  THR achieved. No symptoms. Baseline EKG showed NSR at 100 bpm with prominent Q waves. During exercise, rare PVC noted. No EKG changes suggestive of ischemia. Recovery uneventful. BP response extremely exaggerated. Pending Dr. Marlou Porch.    Past Medical History:  Diagnosis Date  . Arthritis   . BCC (basal cell carcinoma)   . BPH (benign prostatic hyperplasia)   . CAROTID BRUIT, RIGHT 11/30/2009  . DM 11/30/2009  . GERD (gastroesophageal reflux disease)   . Heart murmur   . History of hiatal hernia   . History of Holter monitoring 2009  . History of kidney stones   . HNP (herniated nucleus pulposus), lumbar    L4-5  . HYPERCHOLESTEROLEMIA 11/30/2009  . HYPERTENSION 11/30/2009  . Kidney stones   . Peripheral vascular disease ([place])   . Right carotid bruit   . SMOKER 11/30/2009  . Stroke ([place]) 2012  . URINARY CALCULUS 11/30/2009    Past Surgical History:  Procedure Laterality Date  . ABDOMINAL AORTOGRAM W/LOWER EXTREMITY N/A 06/18/2018   Procedure: ABDOMINAL AORTOGRAM W/LOWER EXTREMITY;  Surgeon: Marty Heck, MD;  Location: [place] [facility];  Service: Cardiovascular;  Laterality: N/A;  . arthroscopy of right  shoulder Right    Jan 2019, June 2019-- done at [place], Dr. Ninfa Linden  . BACK SURGERY    . CATARACT EXTRACTION W/ INTRAOCULAR LENS  IMPLANT, BILATERAL    . CYSTOSCOPY    . FACIAL COSMETIC SURGERY  2014  . FINGER SURGERY  2002  . LUMBAR LAMINECTOMY/DECOMPRESSION MICRODISCECTOMY Left 08/01/2018   Procedure: Microdiscectomy - Lumbar four-Lumbar five - left;  Surgeon: Eustace Moore, MD;  Location: [place];  Service: Neurosurgery;  Laterality: Left;  . LUMBAR LAMINECTOMY/DECOMPRESSION MICRODISCECTOMY Left 10/15/2018   Procedure: Re-do Microdiscectomy - left - Lumbar four-Lumbar five;  Surgeon: Eustace Moore, MD;  Location: [place];  Service: Neurosurgery;  Laterality: Left;  . NECK SURGERY  1986  . PERIPHERAL VASCULAR INTERVENTION  06/18/2018   Procedure: PERIPHERAL VASCULAR INTERVENTION;  Surgeon: Marty Heck, MD;  Location: [place] [facility];  Service: Cardiovascular;;  Left external iliac  . ROTATOR CUFF REPAIR Left   . UMBILICAL HERNIA REPAIR      MEDICATIONS: . amLODipine-benazepril (LOTREL) 10-40 MG  per capsule  . clopidogrel (PLAVIX) 75 MG tablet  . gabapentin (NEURONTIN) 300 MG capsule  . hydrochlorothiazide (HYDRODIURIL) 25 MG tablet  . metFORMIN (GLUCOPHAGE-XR) 500 MG 24 hr tablet  . metoprolol succinate (TOPROL-XL) 50 MG 24 hr tablet  . pantoprazole (PROTONIX) 40 MG tablet  . pioglitazone (ACTOS) 45 MG tablet  . repaglinide (PRANDIN) 2 MG tablet  . rosuvastatin (CRESTOR) 40 MG tablet  . SOLIQUA 100-33 UNT-MCG/ML SOPN   No current facility-administered medications for this encounter.    Myra Gianotti, PA-C Surgical Short Stay/Anesthesiology [facility] Phone [phone] [facility] Phone [phone] 08/19/2019 4:07 PM

## 2019-08-19 NOTE — Anesthesia Preprocedure Evaluation (Addendum)
Anesthesia Evaluation  Patient identified by MRN, date of birth, ID band Patient awake    Reviewed: Allergy & Precautions, NPO status , Patient's Chart, lab work & pertinent test results  Airway Mallampati: II  TM Distance: >3 FB Neck ROM: Full    Dental no notable dental hx. (+) Chipped, Dental Advisory Given,    Pulmonary former smoker,    Pulmonary exam normal breath sounds clear to auscultation       Cardiovascular hypertension, Pt. on medications and Pt. on home beta blockers + Peripheral Vascular Disease  Normal cardiovascular exam+ Valvular Problems/Murmurs  Rhythm:Regular Rate:Normal     Neuro/Psych TIAnegative psych ROS   GI/Hepatic hiatal hernia, GERD  Medicated,  Endo/Other  diabetes, Poorly Controlled, Type 2  Renal/GU Renal diseaseK+ 3.9 Cr 0.85  negative genitourinary   Musculoskeletal  (+) Arthritis ,   Abdominal   Peds  Hematology hgb 12.9 Plt 333   Anesthesia Other Findings   Reproductive/Obstetrics negative OB ROS                       Anesthesia Physical Anesthesia Plan  ASA: III  Anesthesia Plan: General   Post-op Pain Management:    Induction: Intravenous  PONV Risk Score and Plan: Ondansetron, Treatment may vary due to age or medical condition, Dexamethasone and Midazolam  Airway Management Planned: Oral ETT  Additional Equipment: Arterial line  Intra-op Plan:   Post-operative Plan: Extubation in OR  Informed Consent: I have reviewed the patients History and Physical, chart, labs and discussed the procedure including the risks, benefits and alternatives for the proposed anesthesia with the patient or authorized representative who has indicated his/her understanding and acceptance.     Dental advisory given  Plan Discussed with: CRNA  Anesthesia Plan Comments: (PAT note written by Myra Gianotti, PA-C.  GA + Aline  ? Endo tool A1C 9.2 down from 11  but still avg 217)     Anesthesia Quick Evaluation

## 2019-08-19 NOTE — Patient Outreach (Signed)
[facility]) Care Management  08/19/2019  Luis Lindsey 10-09-1950 PT:[id number]    Successful call placed to patient regarding patient assistance receipt of LIS letter from social security in regards to [employer] application for [employer], HIPAA identifiers verified.   Patient informed he received the letter from social security yesterday. He informed he was declined for the Extra Help program. Informed patient that [employer] is requiring a copy of that letter to consider his enrollment in their patient assistance program. Informed patient that I had previously mailed him a return envelope. Patient informed he has the envelope and he would mail the letter to me this week. Once letter is received then I will submit it to the company for consideration.  Follow up:  Will route note to [place] for case closure if document(s) have not been received in the next 15 business days.  Nikiya Starn P. Mekayla Soman, [place]  [phone]

## 2019-08-20 ENCOUNTER — Ambulatory Visit: Payer: HMO | Admitting: Pharmacist

## 2019-08-20 DIAGNOSIS — I10 Essential (primary) hypertension: Secondary | ICD-10-CM | POA: Diagnosis not present

## 2019-08-20 DIAGNOSIS — M5416 Radiculopathy, lumbar region: Secondary | ICD-10-CM | POA: Diagnosis not present

## 2019-08-20 DIAGNOSIS — Z6825 Body mass index (BMI) 25.0-25.9, adult: Secondary | ICD-10-CM | POA: Diagnosis not present

## 2019-08-21 ENCOUNTER — Other Ambulatory Visit (HOSPITAL_COMMUNITY)
Admission: RE | Admit: 2019-08-21 | Discharge: 2019-08-21 | Disposition: A | Discharge status: Home / Self Care | Payer: HMO | Source: Ambulatory Visit | Attending: Vascular Surgery | Admitting: Vascular Surgery

## 2019-08-21 DIAGNOSIS — E7800 Pure hypercholesterolemia, unspecified: Secondary | ICD-10-CM | POA: Diagnosis present

## 2019-08-21 DIAGNOSIS — Z79899 Other long term (current) drug therapy: Secondary | ICD-10-CM | POA: Diagnosis not present

## 2019-08-21 DIAGNOSIS — E1151 Type 2 diabetes mellitus with diabetic peripheral angiopathy without gangrene: Secondary | ICD-10-CM | POA: Diagnosis present

## 2019-08-21 DIAGNOSIS — Z9841 Cataract extraction status, right eye: Secondary | ICD-10-CM | POA: Diagnosis not present

## 2019-08-21 DIAGNOSIS — Z961 Presence of intraocular lens: Secondary | ICD-10-CM | POA: Diagnosis present

## 2019-08-21 DIAGNOSIS — Z885 Allergy status to narcotic agent status: Secondary | ICD-10-CM | POA: Diagnosis not present

## 2019-08-21 DIAGNOSIS — Z823 Family history of stroke: Secondary | ICD-10-CM | POA: Diagnosis not present

## 2019-08-21 DIAGNOSIS — E1165 Type 2 diabetes mellitus with hyperglycemia: Secondary | ICD-10-CM | POA: Diagnosis not present

## 2019-08-21 DIAGNOSIS — Z85828 Personal history of other malignant neoplasm of skin: Secondary | ICD-10-CM | POA: Diagnosis not present

## 2019-08-21 DIAGNOSIS — Z87442 Personal history of urinary calculi: Secondary | ICD-10-CM | POA: Diagnosis not present

## 2019-08-21 DIAGNOSIS — I10 Essential (primary) hypertension: Secondary | ICD-10-CM | POA: Diagnosis present

## 2019-08-21 DIAGNOSIS — Z95820 Peripheral vascular angioplasty status with implants and grafts: Secondary | ICD-10-CM | POA: Diagnosis not present

## 2019-08-21 DIAGNOSIS — E1142 Type 2 diabetes mellitus with diabetic polyneuropathy: Secondary | ICD-10-CM | POA: Diagnosis present

## 2019-08-21 DIAGNOSIS — Z20822 Contact with and (suspected) exposure to covid-19: Secondary | ICD-10-CM | POA: Diagnosis present

## 2019-08-21 DIAGNOSIS — Z7902 Long term (current) use of antithrombotics/antiplatelets: Secondary | ICD-10-CM | POA: Diagnosis not present

## 2019-08-21 DIAGNOSIS — Z9842 Cataract extraction status, left eye: Secondary | ICD-10-CM | POA: Diagnosis not present

## 2019-08-21 DIAGNOSIS — Z833 Family history of diabetes mellitus: Secondary | ICD-10-CM | POA: Diagnosis not present

## 2019-08-21 DIAGNOSIS — Z7984 Long term (current) use of oral hypoglycemic drugs: Secondary | ICD-10-CM | POA: Diagnosis not present

## 2019-08-21 DIAGNOSIS — Z8673 Personal history of transient ischemic attack (TIA), and cerebral infarction without residual deficits: Secondary | ICD-10-CM | POA: Diagnosis not present

## 2019-08-21 DIAGNOSIS — K219 Gastro-esophageal reflux disease without esophagitis: Secondary | ICD-10-CM | POA: Diagnosis present

## 2019-08-21 DIAGNOSIS — I6521 Occlusion and stenosis of right carotid artery: Secondary | ICD-10-CM | POA: Diagnosis not present

## 2019-08-21 DIAGNOSIS — Z87891 Personal history of nicotine dependence: Secondary | ICD-10-CM | POA: Diagnosis not present

## 2019-08-21 DIAGNOSIS — Z8249 Family history of ischemic heart disease and other diseases of the circulatory system: Secondary | ICD-10-CM | POA: Diagnosis not present

## 2019-08-21 DIAGNOSIS — Z888 Allergy status to other drugs, medicaments and biological substances status: Secondary | ICD-10-CM | POA: Diagnosis not present

## 2019-08-21 LAB — SARS CORONAVIRUS 2 (TAT 6-24 HRS): SARS Coronavirus 2: NEGATIVE

## 2019-08-24 ENCOUNTER — Inpatient Hospital Stay (HOSPITAL_COMMUNITY)
Admission: RE | Admit: 2019-08-24 | Discharge: 2019-08-25 | DRG: 039 | Disposition: A | Discharge status: Home / Self Care | Payer: HMO | Attending: Vascular Surgery | Admitting: Vascular Surgery

## 2019-08-24 ENCOUNTER — Inpatient Hospital Stay (HOSPITAL_COMMUNITY): Payer: HMO | Admitting: Physician Assistant

## 2019-08-24 ENCOUNTER — Encounter (HOSPITAL_COMMUNITY): Payer: Self-pay | Admitting: Vascular Surgery

## 2019-08-24 ENCOUNTER — Other Ambulatory Visit: Payer: Self-pay

## 2019-08-24 ENCOUNTER — Encounter (HOSPITAL_COMMUNITY)
Admission: RE | Disposition: A | Discharge status: Home / Self Care | Payer: Self-pay | Source: Ambulatory Visit | Attending: Vascular Surgery

## 2019-08-24 ENCOUNTER — Inpatient Hospital Stay (HOSPITAL_COMMUNITY): Payer: HMO | Admitting: Anesthesiology

## 2019-08-24 DIAGNOSIS — Z7984 Long term (current) use of oral hypoglycemic drugs: Secondary | ICD-10-CM | POA: Diagnosis not present

## 2019-08-24 DIAGNOSIS — I10 Essential (primary) hypertension: Secondary | ICD-10-CM | POA: Diagnosis present

## 2019-08-24 DIAGNOSIS — Z9841 Cataract extraction status, right eye: Secondary | ICD-10-CM

## 2019-08-24 DIAGNOSIS — Z7902 Long term (current) use of antithrombotics/antiplatelets: Secondary | ICD-10-CM | POA: Diagnosis not present

## 2019-08-24 DIAGNOSIS — Z87891 Personal history of nicotine dependence: Secondary | ICD-10-CM | POA: Diagnosis not present

## 2019-08-24 DIAGNOSIS — Z833 Family history of diabetes mellitus: Secondary | ICD-10-CM

## 2019-08-24 DIAGNOSIS — Z87442 Personal history of urinary calculi: Secondary | ICD-10-CM

## 2019-08-24 DIAGNOSIS — Z885 Allergy status to narcotic agent status: Secondary | ICD-10-CM

## 2019-08-24 DIAGNOSIS — Z95820 Peripheral vascular angioplasty status with implants and grafts: Secondary | ICD-10-CM

## 2019-08-24 DIAGNOSIS — Z823 Family history of stroke: Secondary | ICD-10-CM | POA: Diagnosis not present

## 2019-08-24 DIAGNOSIS — Z20822 Contact with and (suspected) exposure to covid-19: Secondary | ICD-10-CM | POA: Diagnosis present

## 2019-08-24 DIAGNOSIS — Z9842 Cataract extraction status, left eye: Secondary | ICD-10-CM | POA: Diagnosis not present

## 2019-08-24 DIAGNOSIS — Z961 Presence of intraocular lens: Secondary | ICD-10-CM | POA: Diagnosis present

## 2019-08-24 DIAGNOSIS — E1151 Type 2 diabetes mellitus with diabetic peripheral angiopathy without gangrene: Secondary | ICD-10-CM | POA: Diagnosis present

## 2019-08-24 DIAGNOSIS — Z888 Allergy status to other drugs, medicaments and biological substances status: Secondary | ICD-10-CM | POA: Diagnosis not present

## 2019-08-24 DIAGNOSIS — E1142 Type 2 diabetes mellitus with diabetic polyneuropathy: Secondary | ICD-10-CM | POA: Diagnosis present

## 2019-08-24 DIAGNOSIS — Z8673 Personal history of transient ischemic attack (TIA), and cerebral infarction without residual deficits: Secondary | ICD-10-CM | POA: Diagnosis not present

## 2019-08-24 DIAGNOSIS — E7800 Pure hypercholesterolemia, unspecified: Secondary | ICD-10-CM | POA: Diagnosis present

## 2019-08-24 DIAGNOSIS — K219 Gastro-esophageal reflux disease without esophagitis: Secondary | ICD-10-CM | POA: Diagnosis present

## 2019-08-24 DIAGNOSIS — Z79899 Other long term (current) drug therapy: Secondary | ICD-10-CM

## 2019-08-24 DIAGNOSIS — Z85828 Personal history of other malignant neoplasm of skin: Secondary | ICD-10-CM

## 2019-08-24 DIAGNOSIS — Z8249 Family history of ischemic heart disease and other diseases of the circulatory system: Secondary | ICD-10-CM | POA: Diagnosis not present

## 2019-08-24 DIAGNOSIS — I6521 Occlusion and stenosis of right carotid artery: Secondary | ICD-10-CM | POA: Diagnosis present

## 2019-08-24 HISTORY — PX: ENDARTERECTOMY: SHX5162

## 2019-08-24 LAB — URINALYSIS, ROUTINE W REFLEX MICROSCOPIC
Bilirubin Urine: NEGATIVE
Glucose, UA: 150 mg/dL — AB
Ketones, ur: NEGATIVE mg/dL
Leukocytes,Ua: NEGATIVE
Nitrite: NEGATIVE
Protein, ur: 30 mg/dL — AB
Specific Gravity, Urine: 1.008 (ref 1.005–1.030)
pH: 6 (ref 5.0–8.0)

## 2019-08-24 LAB — GLUCOSE, CAPILLARY
Glucose-Capillary: 118 mg/dL — ABNORMAL HIGH (ref 70–99)
Glucose-Capillary: 132 mg/dL — ABNORMAL HIGH (ref 70–99)
Glucose-Capillary: 173 mg/dL — ABNORMAL HIGH (ref 70–99)

## 2019-08-24 SURGERY — ENDARTERECTOMY, CAROTID
Anesthesia: General | Site: Neck | Laterality: Right

## 2019-08-24 MED ORDER — PROTAMINE SULFATE 10 MG/ML IV SOLN
INTRAVENOUS | Status: AC
  Filled 2019-08-24: qty 25

## 2019-08-24 MED ORDER — GLYCOPYRROLATE PF 0.2 MG/ML IJ SOSY
PREFILLED_SYRINGE | INTRAMUSCULAR | Status: AC
  Filled 2019-08-24: qty 1

## 2019-08-24 MED ORDER — HEPARIN SODIUM (PORCINE) 1000 UNIT/ML IJ SOLN
INTRAMUSCULAR | Status: AC
  Filled 2019-08-24: qty 1

## 2019-08-24 MED ORDER — GLYCOPYRROLATE 0.2 MG/ML IJ SOLN
INTRAMUSCULAR | Status: DC | PRN
  Administered 2019-08-24: .2 mg via INTRAVENOUS

## 2019-08-24 MED ORDER — FENTANYL CITRATE (PF) 250 MCG/5ML IJ SOLN
INTRAMUSCULAR | Status: AC
  Filled 2019-08-24: qty 5

## 2019-08-24 MED ORDER — MORPHINE SULFATE (PF) 2 MG/ML IV SOLN: 2.0000 mg | INTRAVENOUS | Status: DC | PRN

## 2019-08-24 MED ORDER — DEXAMETHASONE SODIUM PHOSPHATE 10 MG/ML IJ SOLN
INTRAMUSCULAR | Status: AC
  Filled 2019-08-24: qty 1

## 2019-08-24 MED ORDER — BENAZEPRIL HCL 40 MG PO TABS
40.0000 mg | ORAL_TABLET | Freq: Every day | ORAL | Status: DC
  Filled 2019-08-24: qty 1

## 2019-08-24 MED ORDER — BISACODYL 10 MG RE SUPP: 10.0000 mg | Freq: Every day | RECTAL | Status: DC | PRN

## 2019-08-24 MED ORDER — SODIUM CHLORIDE 0.9 % IV SOLN
INTRAVENOUS | Status: AC
  Filled 2019-08-24: qty 1.2

## 2019-08-24 MED ORDER — SUGAMMADEX SODIUM 500 MG/5ML IV SOLN
INTRAVENOUS | Status: AC
  Filled 2019-08-24: qty 5

## 2019-08-24 MED ORDER — ACETAMINOPHEN 325 MG RE SUPP: 325.0000 mg | RECTAL | Status: DC | PRN

## 2019-08-24 MED ORDER — 0.9 % SODIUM CHLORIDE (POUR BTL) OPTIME
TOPICAL | Status: DC | PRN
  Administered 2019-08-24: 1000 mL

## 2019-08-24 MED ORDER — GABAPENTIN 300 MG PO CAPS
300.0000 mg | ORAL_CAPSULE | Freq: Three times a day (TID) | ORAL | Status: DC
  Administered 2019-08-24 – 2019-08-25 (×2): 300 mg via ORAL
  Filled 2019-08-24 (×2): qty 1

## 2019-08-24 MED ORDER — ALUM & MAG HYDROXIDE-SIMETH 200-200-20 MG/5ML PO SUSP: 15.0000 mL | ORAL | Status: DC | PRN

## 2019-08-24 MED ORDER — HYDRALAZINE HCL 20 MG/ML IJ SOLN: 5.0000 mg | INTRAMUSCULAR | Status: DC | PRN

## 2019-08-24 MED ORDER — CLOPIDOGREL BISULFATE 75 MG PO TABS
75.0000 mg | ORAL_TABLET | Freq: Every day | ORAL | Status: DC
  Administered 2019-08-24 – 2019-08-25 (×2): 75 mg via ORAL
  Filled 2019-08-24 (×2): qty 1

## 2019-08-24 MED ORDER — OXYCODONE-ACETAMINOPHEN 5-325 MG PO TABS: 1.0000 | ORAL_TABLET | ORAL | Status: DC | PRN

## 2019-08-24 MED ORDER — DOCUSATE SODIUM 100 MG PO CAPS
100.0000 mg | ORAL_CAPSULE | Freq: Every day | ORAL | Status: DC
  Administered 2019-08-25: 100 mg via ORAL
  Filled 2019-08-24: qty 1

## 2019-08-24 MED ORDER — HEMOSTATIC AGENTS (NO CHARGE) OPTIME
TOPICAL | Status: DC | PRN
  Administered 2019-08-24: 1 via TOPICAL

## 2019-08-24 MED ORDER — SODIUM CHLORIDE 0.9 % IV SOLN
INTRAVENOUS | Status: DC
  Administered 2019-08-24: 10 mL/h via INTRAVENOUS

## 2019-08-24 MED ORDER — AMLODIPINE BESY-BENAZEPRIL HCL 10-40 MG PO CAPS: 1.0000 | ORAL_CAPSULE | Freq: Every day | ORAL | Status: DC

## 2019-08-24 MED ORDER — ROCURONIUM BROMIDE 10 MG/ML (PF) SYRINGE
PREFILLED_SYRINGE | INTRAVENOUS | Status: DC | PRN
  Administered 2019-08-24: 70 mg via INTRAVENOUS
  Administered 2019-08-24: 20 mg via INTRAVENOUS
  Administered 2019-08-24: 30 mg via INTRAVENOUS

## 2019-08-24 MED ORDER — MIDAZOLAM HCL 2 MG/2ML IJ SOLN
INTRAMUSCULAR | Status: AC
  Filled 2019-08-24: qty 2

## 2019-08-24 MED ORDER — PHENOL 1.4 % MT LIQD
1.0000 | OROMUCOSAL | Status: DC | PRN
  Administered 2019-08-24: 1 via OROMUCOSAL
  Filled 2019-08-24: qty 177

## 2019-08-24 MED ORDER — HEPARIN SODIUM (PORCINE) 1000 UNIT/ML IJ SOLN
INTRAMUSCULAR | Status: DC | PRN
  Administered 2019-08-24: 9000 [IU] via INTRAVENOUS
  Administered 2019-08-24: 2000 [IU] via INTRAVENOUS

## 2019-08-24 MED ORDER — POTASSIUM CHLORIDE CRYS ER 20 MEQ PO TBCR: 20.0000 meq | EXTENDED_RELEASE_TABLET | Freq: Every day | ORAL | Status: DC | PRN

## 2019-08-24 MED ORDER — OXYCODONE-ACETAMINOPHEN 5-325 MG PO TABS
1.0000 | ORAL_TABLET | ORAL | Status: DC | PRN
  Administered 2019-08-24: 21:00:00 1 via ORAL
  Administered 2019-08-25: 2 via ORAL
  Filled 2019-08-24: qty 2
  Filled 2019-08-24: qty 1

## 2019-08-24 MED ORDER — CEFAZOLIN SODIUM-DEXTROSE 2-4 GM/100ML-% IV SOLN
2.0000 g | INTRAVENOUS | Status: AC
  Administered 2019-08-24: 10:00:00 2 g via INTRAVENOUS
  Filled 2019-08-24: qty 100

## 2019-08-24 MED ORDER — LABETALOL HCL 5 MG/ML IV SOLN: 10.0000 mg | INTRAVENOUS | Status: DC | PRN

## 2019-08-24 MED ORDER — DEXAMETHASONE SODIUM PHOSPHATE 10 MG/ML IJ SOLN
INTRAMUSCULAR | Status: DC | PRN
  Administered 2019-08-24: 5 mg via INTRAVENOUS

## 2019-08-24 MED ORDER — LIDOCAINE 2% (20 MG/ML) 5 ML SYRINGE
INTRAMUSCULAR | Status: DC | PRN
  Administered 2019-08-24: 60 mg via INTRAVENOUS
  Administered 2019-08-24: 40 mg via INTRAVENOUS

## 2019-08-24 MED ORDER — SODIUM CHLORIDE 0.9 % IV SOLN: INTRAVENOUS | Status: DC

## 2019-08-24 MED ORDER — CEFAZOLIN SODIUM-DEXTROSE 2-4 GM/100ML-% IV SOLN
2.0000 g | Freq: Three times a day (TID) | INTRAVENOUS | Status: AC
  Administered 2019-08-24 – 2019-08-25 (×2): 2 g via INTRAVENOUS
  Filled 2019-08-24 (×2): qty 100

## 2019-08-24 MED ORDER — PROPOFOL 10 MG/ML IV BOLUS
INTRAVENOUS | Status: AC
  Filled 2019-08-24: qty 20

## 2019-08-24 MED ORDER — PROTAMINE SULFATE 10 MG/ML IV SOLN
INTRAVENOUS | Status: DC | PRN
  Administered 2019-08-24: 10 mg via INTRAVENOUS
  Administered 2019-08-24 (×2): 20 mg via INTRAVENOUS

## 2019-08-24 MED ORDER — PHENYLEPHRINE HCL-NACL 10-0.9 MG/250ML-% IV SOLN
INTRAVENOUS | Status: DC | PRN
  Administered 2019-08-24: 25 ug/min via INTRAVENOUS

## 2019-08-24 MED ORDER — LIDOCAINE HCL (PF) 1 % IJ SOLN: INTRAMUSCULAR | Status: DC | PRN

## 2019-08-24 MED ORDER — ROSUVASTATIN CALCIUM 20 MG PO TABS
40.0000 mg | ORAL_TABLET | Freq: Every day | ORAL | Status: DC
  Administered 2019-08-25: 40 mg via ORAL
  Filled 2019-08-24: qty 2

## 2019-08-24 MED ORDER — ONDANSETRON HCL 4 MG/2ML IJ SOLN
INTRAMUSCULAR | Status: AC
  Filled 2019-08-24: qty 2

## 2019-08-24 MED ORDER — AMLODIPINE BESYLATE 10 MG PO TABS
10.0000 mg | ORAL_TABLET | Freq: Every day | ORAL | Status: DC
  Administered 2019-08-25: 10 mg via ORAL
  Filled 2019-08-24: qty 1

## 2019-08-24 MED ORDER — PROPOFOL 10 MG/ML IV BOLUS
INTRAVENOUS | Status: DC | PRN
  Administered 2019-08-24: 130 mg via INTRAVENOUS

## 2019-08-24 MED ORDER — INSULIN ASPART 100 UNIT/ML ~~LOC~~ SOLN
0.0000 [IU] | Freq: Three times a day (TID) | SUBCUTANEOUS | Status: DC
  Administered 2019-08-25: 3 [IU] via SUBCUTANEOUS

## 2019-08-24 MED ORDER — MAGNESIUM SULFATE 2 GM/50ML IV SOLN: 2.0000 g | Freq: Every day | INTRAVENOUS | Status: DC | PRN

## 2019-08-24 MED ORDER — METOPROLOL TARTRATE 5 MG/5ML IV SOLN: 2.0000 mg | INTRAVENOUS | Status: DC | PRN

## 2019-08-24 MED ORDER — LIDOCAINE 2% (20 MG/ML) 5 ML SYRINGE
INTRAMUSCULAR | Status: AC
  Filled 2019-08-24: qty 15

## 2019-08-24 MED ORDER — GUAIFENESIN-DM 100-10 MG/5ML PO SYRP: 15.0000 mL | ORAL_SOLUTION | ORAL | Status: DC | PRN

## 2019-08-24 MED ORDER — ONDANSETRON HCL 4 MG/2ML IJ SOLN
INTRAMUSCULAR | Status: DC | PRN
  Administered 2019-08-24: 4 mg via INTRAVENOUS

## 2019-08-24 MED ORDER — ONDANSETRON HCL 4 MG/2ML IJ SOLN: 4.0000 mg | Freq: Four times a day (QID) | INTRAMUSCULAR | Status: DC | PRN

## 2019-08-24 MED ORDER — ROCURONIUM BROMIDE 10 MG/ML (PF) SYRINGE
PREFILLED_SYRINGE | INTRAVENOUS | Status: AC
  Filled 2019-08-24: qty 30

## 2019-08-24 MED ORDER — SUGAMMADEX SODIUM 200 MG/2ML IV SOLN
INTRAVENOUS | Status: DC | PRN
  Administered 2019-08-24: 300 mg via INTRAVENOUS

## 2019-08-24 MED ORDER — LIDOCAINE HCL (PF) 1 % IJ SOLN
INTRAMUSCULAR | Status: AC
  Filled 2019-08-24: qty 30

## 2019-08-24 MED ORDER — SENNOSIDES-DOCUSATE SODIUM 8.6-50 MG PO TABS: 1.0000 | ORAL_TABLET | Freq: Every evening | ORAL | Status: DC | PRN

## 2019-08-24 MED ORDER — ASPIRIN EC 81 MG PO TBEC
81.0000 mg | DELAYED_RELEASE_TABLET | Freq: Every day | ORAL | Status: DC
  Administered 2019-08-25: 81 mg via ORAL
  Filled 2019-08-24: qty 1

## 2019-08-24 MED ORDER — SODIUM CHLORIDE 0.9 % IV SOLN: 500.0000 mL | Freq: Once | INTRAVENOUS | Status: DC | PRN

## 2019-08-24 MED ORDER — METOPROLOL SUCCINATE ER 50 MG PO TB24
50.0000 mg | ORAL_TABLET | Freq: Every day | ORAL | Status: DC
  Administered 2019-08-25: 50 mg via ORAL
  Filled 2019-08-24: qty 1

## 2019-08-24 MED ORDER — ACETAMINOPHEN 325 MG PO TABS
325.0000 mg | ORAL_TABLET | ORAL | Status: DC | PRN
  Administered 2019-08-24: 650 mg via ORAL
  Filled 2019-08-24: qty 2

## 2019-08-24 MED ORDER — LACTATED RINGERS IV SOLN: INTRAVENOUS | Status: DC

## 2019-08-24 MED ORDER — CHLORHEXIDINE GLUCONATE CLOTH 2 % EX PADS: 6.0000 | MEDICATED_PAD | Freq: Once | CUTANEOUS | Status: DC

## 2019-08-24 MED ORDER — PANTOPRAZOLE SODIUM 40 MG PO TBEC
40.0000 mg | DELAYED_RELEASE_TABLET | Freq: Every day | ORAL | Status: DC
  Administered 2019-08-25: 40 mg via ORAL
  Filled 2019-08-24: qty 1

## 2019-08-24 MED ORDER — MIDAZOLAM HCL 5 MG/5ML IJ SOLN
INTRAMUSCULAR | Status: DC | PRN
  Administered 2019-08-24: 1 mg via INTRAVENOUS

## 2019-08-24 MED ORDER — SODIUM CHLORIDE 0.9 % IV SOLN
INTRAVENOUS | Status: DC | PRN
  Administered 2019-08-24: 500 mL

## 2019-08-24 MED ORDER — FENTANYL CITRATE (PF) 250 MCG/5ML IJ SOLN
INTRAMUSCULAR | Status: DC | PRN
  Administered 2019-08-24 (×4): 50 ug via INTRAVENOUS

## 2019-08-24 SURGICAL SUPPLY — 53 items
CANISTER SUCT 3000ML PPV (MISCELLANEOUS) ×2 IMPLANT
CATH ROBINSON RED A/P 18FR (CATHETERS) ×2 IMPLANT
CLIP VESOCCLUDE MED 24/CT (CLIP) ×2 IMPLANT
CLIP VESOCCLUDE SM WIDE 24/CT (CLIP) ×2 IMPLANT
COVER PROBE W GEL 5X96 (DRAPES) ×2 IMPLANT
COVER TRANSDUCER ULTRASND GEL (DRAPE) ×2 IMPLANT
DERMABOND ADHESIVE PROPEN (GAUZE/BANDAGES/DRESSINGS) ×1
DERMABOND ADVANCED (GAUZE/BANDAGES/DRESSINGS) ×1
DERMABOND ADVANCED .7 DNX12 (GAUZE/BANDAGES/DRESSINGS) ×1 IMPLANT
DERMABOND ADVANCED .7 DNX6 (GAUZE/BANDAGES/DRESSINGS) ×1 IMPLANT
DRAIN CHANNEL 15F RND FF W/TCR (WOUND CARE) IMPLANT
ELECT REM PT RETURN 9FT ADLT (ELECTROSURGICAL) ×2
ELECTRODE REM PT RTRN 9FT ADLT (ELECTROSURGICAL) ×1 IMPLANT
EVACUATOR SILICONE 100CC (DRAIN) IMPLANT
GLOVE BIO SURGEON STRL SZ7.5 (GLOVE) ×2 IMPLANT
GLOVE BIOGEL PI IND STRL 8 (GLOVE) ×1 IMPLANT
GLOVE BIOGEL PI INDICATOR 8 (GLOVE) ×1
GOWN STRL REUS W/ TWL LRG LVL3 (GOWN DISPOSABLE) ×4 IMPLANT
GOWN STRL REUS W/ TWL XL LVL3 (GOWN DISPOSABLE) ×2 IMPLANT
GOWN STRL REUS W/TWL LRG LVL3 (GOWN DISPOSABLE) ×8
GOWN STRL REUS W/TWL XL LVL3 (GOWN DISPOSABLE) ×4
HEMOSTAT SNOW SURGICEL 2X4 (HEMOSTASIS) IMPLANT
IV ADAPTER SYR DOUBLE MALE LL (MISCELLANEOUS) IMPLANT
KIT BASIN OR (CUSTOM PROCEDURE TRAY) ×2 IMPLANT
KIT SHUNT ARGYLE CAROTID ART 6 (VASCULAR PRODUCTS) IMPLANT
KIT TURNOVER KIT B (KITS) ×2 IMPLANT
LOOP VESSEL MINI RED (MISCELLANEOUS) IMPLANT
NEEDLE HYPO 25GX1X1/2 BEV (NEEDLE) ×2 IMPLANT
NEEDLE SPNL 20GX3.5 QUINCKE YW (NEEDLE) IMPLANT
NS IRRIG 1000ML POUR BTL (IV SOLUTION) ×6 IMPLANT
PACK CAROTID (CUSTOM PROCEDURE TRAY) ×2 IMPLANT
PAD ARMBOARD 7.5X6 YLW CONV (MISCELLANEOUS) ×4 IMPLANT
PATCH VASC XENOSURE 1CMX6CM (Vascular Products) ×2 IMPLANT
PATCH VASC XENOSURE 1X6 (Vascular Products) ×1 IMPLANT
POSITIONER HEAD DONUT 9IN (MISCELLANEOUS) ×2 IMPLANT
SET WALTER ACTIVATION W/DRAPE (SET/KITS/TRAYS/PACK) ×2 IMPLANT
SHUNT CAROTID BYPASS 10 (VASCULAR PRODUCTS) ×2 IMPLANT
SHUNT CAROTID BYPASS 12FRX15.5 (VASCULAR PRODUCTS) ×2 IMPLANT
SPONGE SURGIFOAM ABS GEL 100 (HEMOSTASIS) IMPLANT
STOPCOCK 4 WAY LG BORE MALE ST (IV SETS) IMPLANT
SUT ETHILON 3 0 PS 1 (SUTURE) IMPLANT
SUT MNCRL AB 4-0 PS2 18 (SUTURE) ×2 IMPLANT
SUT PROLENE 5 0 C 1 24 (SUTURE) ×2 IMPLANT
SUT PROLENE 6 0 BV (SUTURE) ×4 IMPLANT
SUT PROLENE 7 0 BV1 MDA (SUTURE) ×2 IMPLANT
SUT SILK 3 0 (SUTURE)
SUT SILK 3-0 18XBRD TIE 12 (SUTURE) IMPLANT
SUT VIC AB 3-0 SH 27 (SUTURE) ×2
SUT VIC AB 3-0 SH 27X BRD (SUTURE) ×1 IMPLANT
SYR CONTROL 10ML LL (SYRINGE) IMPLANT
TOWEL GREEN STERILE (TOWEL DISPOSABLE) ×2 IMPLANT
TUBING ART PRESS 48 MALE/FEM (TUBING) ×2 IMPLANT
WATER STERILE IRR 1000ML POUR (IV SOLUTION) ×2 IMPLANT

## 2019-08-24 NOTE — H&P (Signed)
History and Physical Interval Note:  08/24/2019 9:32 AM  Luis Lindsey  has presented today for surgery, with the diagnosis of RIGHT CAROTID STENOSIS.  The various methods of treatment have been discussed with the patient and family. After consideration of risks, benefits and other options for treatment, the patient has consented to  Procedure(s): ENDARTERECTOMY CAROTID (Right) as a surgical intervention.  The patient's history has been reviewed, patient examined, no change in status, stable for surgery.  I have reviewed the patient's chart and labs.  Questions were answered to the patient's satisfaction.    Plan right carotid endarterectomy for high-grade asymptomatic stenosis.  Discussed findings of CTA that confirmed duplex findings of high-grade stenosis.  This is a fairly high lesion that extends up into the mid ICA while past the jawline.  Discussed that in the setting of high lesion there is increased risk for cranial nerve injury in addition to other risks of perioperative stroke, bleeding requiring return to OR, risk of anesthesia, etc.    Marty Heck  Patient name: Luis Lindsey MRN: [id number] DOB: 1951/05/02 Sex: male  REASON FOR VISIT: 79-month follow-up for surveillance of left iliac stent in setting of claudciation and carotid duplex for carotid surveillance  HPI:  Luis Lindsey is a 69 y.o. male presents for 62-month follow-up for surveillance of left iliac stent and carotid duplex. He previously underwent left external iliac stent on 06/18/2018 for left lower extremity lifestyle limiting claudication. Patient states he saw some initial improvement after the stent was placed but then he also underwent L4-L5 microdiscectomy with Dr. Ronnald Ramp and has seen even more continued improvement. Over the past 3 months, he reports he gets a little tightness in his left calf but this is very tolerable. States his back started bothering him again at the end of last year and is due for an  injection. No problems with the right leg.  He was also scheduled for carotid duplex today given carotid bruit was appreciated by the PA on his last visit. He thinks he had a stroke in 2012 but does not remember if he has any deficits from that. He states he is fully neurologically intact at this time. No neurologic deficits over the last 6 months. Takes plavix daily for PVD. No neck radiation. No neck surgery other than posterior cervical operation.      Past Medical History:  Diagnosis Date  . Arthritis   . BCC (basal cell carcinoma)   . BPH (benign prostatic hyperplasia)   . CAROTID BRUIT, RIGHT 11/30/2009  . DM 11/30/2009  . GERD (gastroesophageal reflux disease)   . Heart murmur   . History of hiatal hernia   . History of Holter monitoring 2009  . History of kidney stones   . HNP (herniated nucleus pulposus), lumbar    L4-5  . HYPERCHOLESTEROLEMIA 11/30/2009  . HYPERTENSION 11/30/2009  . Kidney stones   . Peripheral vascular disease ([place])   . Right carotid bruit   . SMOKER 11/30/2009  . Stroke ([place]) 2012  . URINARY CALCULUS 11/30/2009        Past Surgical History:  Procedure Laterality Date  . ABDOMINAL AORTOGRAM W/LOWER EXTREMITY N/A 06/18/2018   Procedure: ABDOMINAL AORTOGRAM W/LOWER EXTREMITY; Surgeon: Marty Heck, MD; Location: [facility]; Service: Cardiovascular; Laterality: N/A;  . arthroscopy of right shoulder Right    Jan 2019, June 2019-- done at [facility], Dr. Ninfa Linden  . BACK SURGERY    . CATARACT EXTRACTION  W/ INTRAOCULAR LENS IMPLANT, BILATERAL    . CYSTOSCOPY    . FACIAL COSMETIC SURGERY  2014  . FINGER SURGERY  2002  . LUMBAR LAMINECTOMY/DECOMPRESSION MICRODISCECTOMY Left 08/01/2018   Procedure: Microdiscectomy - Lumbar four-Lumbar five - left; Surgeon: Eustace Moore, MD; Location: [facility]; Service: Neurosurgery; Laterality: Left;  . LUMBAR LAMINECTOMY/DECOMPRESSION MICRODISCECTOMY Left 10/15/2018   Procedure: Re-do Microdiscectomy -  left - Lumbar four-Lumbar five; Surgeon: Eustace Moore, MD; Location: [facility]; Service: Neurosurgery; Laterality: Left;  . NECK SURGERY  1986  . PERIPHERAL VASCULAR INTERVENTION  06/18/2018   Procedure: PERIPHERAL VASCULAR INTERVENTION; Surgeon: Marty Heck, MD; Location: [facility]; Service: Cardiovascular;; Left external iliac  . ROTATOR CUFF REPAIR Left   . UMBILICAL HERNIA REPAIR          Family History  Problem Relation Age of Onset  . Diabetes Sister   . Diabetes Mellitus I Sister   . Stroke Father   . Heart disease Father   . CVA Father   . Coronary artery disease Father   . Diabetes Mother   . Heart disease Mother   . Diabetes Mellitus I Mother   . Coronary artery disease Mother   . Coronary artery disease Brother   . Heart disease Brother   . Coronary artery disease Brother    SOCIAL HISTORY:  Social History        Tobacco Use  . Smoking status: Former Smoker    Years: 40.00    Types: Cigarettes    Quit date: 12/2015    Years since quitting: 3.6  . Smokeless tobacco: Never Used  . Tobacco comment: Quit 12-2012  Substance Use Topics  . Alcohol use: No        Allergies  Allergen Reactions  . Liraglutide Nausea And Vomiting    REACTION: vomiting  . Codeine Nausea Only         Current Outpatient Medications  Medication Sig Dispense Refill  . amLODipine-benazepril (LOTREL) 10-40 MG per capsule Take 1 capsule by mouth daily.     . clopidogrel (PLAVIX) 75 MG tablet Take 1 tablet (75 mg total) by mouth daily. 90 tablet 4  . gabapentin (NEURONTIN) 300 MG capsule Take 1 capsule (300 mg total) by mouth 3 (three) times daily. 270 capsule 4  . hydrochlorothiazide (HYDRODIURIL) 25 MG tablet Take 25 mg by mouth daily.     . metFORMIN (GLUCOPHAGE-XR) 500 MG 24 hr tablet Take 1,000 mg by mouth 2 (two) times daily.    . metoprolol succinate (TOPROL-XL) 50 MG 24 hr tablet Take 50 mg by mouth daily. Take with or immediately following a meal.    .  pantoprazole (PROTONIX) 40 MG tablet Take 40 mg by mouth daily with breakfast.     . pioglitazone (ACTOS) 45 MG tablet TAKE 1 TABLET DAILY (NEED APPOINTMENT FOR FURTHER REFILLS) (Patient taking differently: Take 45 mg by mouth daily. ) 90 tablet 0  . repaglinide (PRANDIN) 2 MG tablet Take 2 mg by mouth 3 (three) times daily before meals.     . rosuvastatin (CRESTOR) 40 MG tablet Take 40 mg by mouth daily.     . TRESIBA FLEXTOUCH 100 UNIT/ML SOPN FlexTouch Pen Inject 60 Units into the skin daily.   12   No current facility-administered medications for this visit.   REVIEW OF SYSTEMS:  [X]  denotes positive finding, [ ]  denotes negative finding  Cardiac  Comments:  Chest pain or chest pressure:    Shortness of breath  upon exertion:    Short of breath when lying flat:    Irregular heart rhythm:        Vascular    Pain in calf, thigh, or hip brought on by ambulation:    Pain in feet at night that wakes you up from your sleep:     Blood clot in your veins:    Leg swelling:         Pulmonary    Oxygen at home:    Productive cough:     Wheezing:         Neurologic    Sudden weakness in arms or legs:     Sudden numbness in arms or legs:     Sudden onset of difficulty speaking or slurred speech:    Temporary loss of vision in one eye:     Problems with dizziness:         Gastrointestinal    Blood in stool:     Vomited blood:         Genitourinary    Burning when urinating:     Blood in urine:        Psychiatric    Major depression:         Hematologic    Bleeding problems:    Problems with blood clotting too easily:        Skin    Rashes or ulcers:        Constitutional    Fever or chills:    PHYSICAL EXAM:      Vitals:   08/11/19 1004 08/11/19 1009  BP: (!) 153/72 (!) 153/73  Pulse: 74 72  Resp: 16   Temp: 97.8 F (36.6 C)   TempSrc: Temporal   SpO2: 100%   Weight: 179 lb (81.2 kg)   Height: 5\' 10"  (1.778 m)    GENERAL: The patient is a well-nourished male,  in no acute distress. The vital signs are documented above.  CARDIAC: There is a regular rate and rhythm.  VASCULAR:  Palpable femoral pulse palpable bilateral groins  Right PT palpable  No left pedal pulses palpable  PULMONARY: There is good air exchange bilaterally without wheezing or rales.  ABDOMEN: Soft and non-tender with normal pitched bowel sounds.  MUSCULOSKELETAL: There are no major deformities or cyanosis.  NEUROLOGIC: No focal weakness or paresthesias are detected.  DATA:  Aortoiliac duplex shows a patent left external iliac artery stent.  ABIs are 0.8 on the right biphasic and 0.61 on the left monophasic  Carotid duplex shows high-grade greater than 80% stenosis in the right ICA proximally with velocity 642/132 there is evidence of disease extending into the mid distal ICA.  Assessment/Plan:  69 year old male that initially presented with left lower extremity short distance claudication and underwent left external iliac stent early last year. Overall the symptoms in the left lower extremity are significantly improved and has had no progression. The left external iliac stent looks patent on duplex today.  His carotid disease has progressed significantly. He now has evidence of an asymptomatic high-grade right ICA stenosis more than 80%. I have subsequently recommended right carotid endarterectomy after review of his carotid duplex. Given evidence of disease extending to the mid distal ICA based on duplex, I am geting a CTA neck just to ensure that we can find a distal clamp site safely during surgery. We will schedule him for Monday, 08/24/2019 for right carotid endarterectomy. Risk and benefits were discussed in detail as well as risks of 1%  perioperative stroke bleeding infection etc. Discussed the goal of surgery is reduce his overall risk of stroke long-term.  Marty Heck, MD  [facility] [place]  Office: [place]

## 2019-08-24 NOTE — Anesthesia Procedure Notes (Signed)
Procedure Name: Intubation Date/Time: 08/24/2019 10:07 AM Performed by: Mariea Clonts, CRNA Pre-anesthesia Checklist: Patient identified, Emergency Drugs available, Suction available and Patient being monitored Patient Re-evaluated:Patient Re-evaluated prior to induction Oxygen Delivery Method: Circle System Utilized Preoxygenation: Pre-oxygenation with 100% oxygen Induction Type: IV induction Ventilation: Mask ventilation without difficulty and Oral airway inserted - appropriate to patient size Laryngoscope Size: Sabra Heck and 2 Grade View: Grade II Tube type: Oral Tube size: 7.5 mm Number of attempts: 1 Airway Equipment and Method: Stylet and Oral airway Placement Confirmation: ETT inserted through vocal cords under direct vision,  positive ETCO2 and breath sounds checked- equal and bilateral Tube secured with: Tape Dental Injury: Teeth and Oropharynx as per pre-operative assessment

## 2019-08-24 NOTE — Op Note (Signed)
OPERATIVE NOTE  PROCEDURE:  right carotid endarterectomy with bovine patch angioplasty  PRE-OPERATIVE DIAGNOSIS: right asymptomatic high grade carotid stenosis (>80%)  POST-OPERATIVE DIAGNOSIS: same as above   SURGEON: Marty Heck, MD  ASSISTANT(S): Risa Grill, PA  ANESTHESIA: general  ESTIMATED BLOOD LOSS: 50 cc  FINDING(S): 1.  High grade right carotid plaque that extended distally into mid ICA with high lesion. 2.  Continuous Doppler audible flow signatures are  appropriate for each carotid artery after endarterectomy and patch angioplasty.  SPECIMEN(S):  Carotid plaque (sent to Pathology)  INDICATIONS:   Luis Lindsey is a 69 y.o. male who presents with right asymptomatic high grade carotid stenosis greater than 80%.  I discussed with the patient the risks, benefits, and alternatives to carotid endarterectomy and he understands this is a high lesion.  I discussed the procedural details of carotid endarterectomy with the patient.  The patient is aware that the risks of carotid endarterectomy include but are not limited to: bleeding, infection, stroke, myocardial infarction, death, cranial nerve injuries both temporary and permanent, neck hematoma, possible airway compromise, labile blood pressure post-operatively, cerebral hyperperfusion syndrome, and possible need for additional interventions in the future. The patient is aware of the risks and agrees to proceed forward with the procedure.  DESCRIPTION: After full informed written consent was obtained from the patient, the patient was brought back to the operating room and placed supine upon the operating table.  Prior to induction, the patient received IV antibiotics.  After obtaining adequate anesthesia, the patient was placed into semi-Fowler position with a shoulder roll in place and the patient's neck slightly hyperextended and rotated away from the surgical site.  The patient was prepped in the standard fashion  for a right carotid endarterectomy.  I made an incision anterior to the sternocleidomastoid muscle and dissected down through the subcutaneous tissue.  The platysma was opened with electrocautery.  I then used Bovie cautery and blunt dissection to dissect through the underlying platysma and to mobilize the anterior border of the sternocleidomastoid as well as the internal jugular vein laterally.  The facial vein was ligated with 3-0 silk and divided.  Several other brances off internal jugular vein were also divided between 3-0 silk ties and divided.  After identifying the carotid artery I used Metzenbaum scissors to bluntly dissect the common carotid artery and then controlled this with a umbilical tape.  At this point in time the patient was given 100 units/kg of IV heparin and we checked an ACT to ensure it was greater than 250.  I then carried my dissection cephalad and mobilized the external carotid artery and superior thyroid artery and controlled each of these with a vessel loop.  I then dissected out the internal carotid artery well past the distal plaque.  The plaque was high as noted on preoperative CTA and extended into mid ICA distally.  The internal carotid artery was then controlled with a Vesseloop as well. I was careful to identify the vagus nerve between the internal jugular and common carotid and this was presereved.  I was also careful to identify and preserve the hypoglossal nerve and this was preserved.    I mobilized the hypoglossal nerve medially to get more distal exposure on ICA.  I then positioned a fixed appendiceal retractor for added visualization distally.  Once our ACT was confirmed, I proceeded by clamping the internal carotid artery with a angled bulldog clamp first.  The proximal common carotid artery was controlled with  a angled debakey clamp.  The external carotid was controlled with a vessel loop.  I subsequently opened the common carotid artery with an 11 blade scalpel in  longitudinal fashion and extended the arteriotomy with Potts scissors onto the ICA past the distal plaque.   I then used a Garment/textile technologist and performed a endarterectomy starting in the common carotid artery.  The external carotid artery was endarterectomized with an eversion technique and I was careful to feather the distal ICA plaque.  The specimen was passed off the field.  At this point a 10 [place] shunt was brought to the field and then initially placed distally into the ICA after removing the clamp. The shunt was back bleed with good flow.  I then placed the proximal end of the shunt in the common carotid artery and controlled this with a Rummel tourniquet.  The endarterectomy site was then flushed with heparinized saline and I was careful to ensure there were no flaps in the endarterectomy site.   The distal endarterectomy site was tacked down with multiple 7-0 prolene sutuers.  I then brought a bovine carotid patch on the field and this was sewn in place with a running anastomosis using a 6-0 Prolene distally and a 5-0 proximal.  The bovine patch was trimmed accordingly.  The shunt was removed just before completion of the patch.  The artery was flushed antegrade and retrograde prior to completion of the patch.  Once the patch was complete, I flushed up the external carotid artery first prior to releasing the internal carotid artery clamp.  An intraoperative doppler was performed and there were good signals in the ICA and ECA after patch angioplasty.  Once I was happy with the intraoperative doppler the patient was given protamine for reversal.  I used Surgicel Snow to get hemostasis around the patch.  Ultimately the platysma was closed in running fashion with 3-0 Vicryl.  The skin was closed with a running 4-0 Monocryl.  Dermabond was applied with a dry sterile dressing.  The patient was awakened from anesthesia with no new neurological deficit and taken to PACU in stable condition.      COMPLICATIONS: None  CONDITION: Stable  Marty Heck, MD [facility]: [phone]  08/24/2019, 12:25 PM

## 2019-08-24 NOTE — Anesthesia Postprocedure Evaluation (Signed)
Anesthesia Post Note  Patient: Luis Lindsey  Procedure(s) Performed: ENDARTERECTOMY CAROTID (Right Neck)     Patient location during evaluation: PACU Anesthesia Type: General Level of consciousness: awake and alert Pain management: pain level controlled Vital Signs Assessment: post-procedure vital signs reviewed and stable Respiratory status: spontaneous breathing, nonlabored ventilation, respiratory function stable and patient connected to nasal cannula oxygen Cardiovascular status: blood pressure returned to baseline and stable Postop Assessment: no apparent nausea or vomiting Anesthetic complications: no    Last Vitals:  Vitals:   08/24/19 1315 08/24/19 1415  BP: (!) 115/42   Pulse: (!) 51 (!) 54  Resp: 12 16  Temp:    SpO2: 98% 98%    Last Pain:  Vitals:   08/24/19 1415  TempSrc:   PainSc: Asleep    LLE Motor Response: Purposeful movement (08/24/19 1415) LLE Sensation: Full sensation (08/24/19 1415) RLE Motor Response: Purposeful movement (08/24/19 1415) RLE Sensation: Full sensation (08/24/19 1415)      Barnet Glasgow

## 2019-08-24 NOTE — OR Nursing (Signed)
Patient moved all four extremities on command prior to leaving the OR.

## 2019-08-24 NOTE — Anesthesia Procedure Notes (Signed)
Arterial Line Insertion Start/End3/29/2021 9:50 AM, 08/24/2019 10:01 AM Performed by: Lavell Luster, CRNA, CRNA  Preanesthetic checklist: patient identified, IV checked, risks and benefits discussed, surgical consent, pre-op evaluation and timeout performed Lidocaine 1% used for infiltration Left, radial was placed Catheter size: 20 G Hand hygiene performed , maximum sterile barriers used  and Seldinger technique used Allen's test indicative of satisfactory collateral circulation Attempts: 1 Procedure performed without using ultrasound guided technique. Following insertion, Biopatch and dressing applied. Post procedure assessment: normal  Patient tolerated the procedure well with no immediate complications.

## 2019-08-24 NOTE — Progress Notes (Signed)
  Day of Surgery Note    Subjective:  Just some post-op neck soreness.  Still in PACU. Had few bites of dinner.   Vitals:   08/24/19 1515 08/24/19 1615  BP:    Pulse: (!) 57 61  Resp: 15 14  Temp:    SpO2: 100% 100%    Incisions:   Well approximated. Minimal edema. No hematoma or bleeding Extremities:  Moves all well Cardiac:  RRR Lungs:  nonlabored Neuro: tongue midline; face symmetrical    Assessment/Plan:  This is a 69 y.o. male who is s/p right CEA. Hemodynamically stable and neurologically intact. [place], PA-C 08/24/2019 4:49 PM [phone]

## 2019-08-24 NOTE — Transfer of Care (Signed)
Immediate Anesthesia Transfer of Care Note  Patient: Luis Lindsey  Procedure(s) Performed: ENDARTERECTOMY CAROTID (Right Neck)  Patient Location: PACU  Anesthesia Type:General  Level of Consciousness: awake, alert  and oriented  Airway & Oxygen Therapy: Patient Spontanous Breathing and Patient connected to nasal cannula oxygen  Post-op Assessment: Report given to RN, Post -op Vital signs reviewed and stable and Patient moving all extremities X 4  Post vital signs: Reviewed and stable  Last Vitals:  Vitals Value Taken Time  BP 131/53 08/24/19 1244  Temp 36.7 C 08/24/19 1245  Pulse 64 08/24/19 1249  Resp 17 08/24/19 1249  SpO2 100 % 08/24/19 1249  Vitals shown include unvalidated device data.  Last Pain:  Vitals:   08/24/19 0815  TempSrc:   PainSc: 0-No pain         Complications: No apparent anesthesia complications

## 2019-08-25 ENCOUNTER — Encounter: Payer: Self-pay | Admitting: *Deleted

## 2019-08-25 ENCOUNTER — Other Ambulatory Visit: Payer: Self-pay

## 2019-08-25 LAB — CBC
HCT: 34.8 % — ABNORMAL LOW (ref 39.0–52.0)
Hemoglobin: 11.2 g/dL — ABNORMAL LOW (ref 13.0–17.0)
MCH: 29 pg (ref 26.0–34.0)
MCHC: 32.2 g/dL (ref 30.0–36.0)
MCV: 90.2 fL (ref 80.0–100.0)
Platelets: 280 10*3/uL (ref 150–400)
RBC: 3.86 MIL/uL — ABNORMAL LOW (ref 4.22–5.81)
RDW: 15.1 % (ref 11.5–15.5)
WBC: 13 10*3/uL — ABNORMAL HIGH (ref 4.0–10.5)
nRBC: 0 % (ref 0.0–0.2)

## 2019-08-25 LAB — BASIC METABOLIC PANEL
Anion gap: 10 (ref 5–15)
BUN: 20 mg/dL (ref 8–23)
CO2: 20 mmol/L — ABNORMAL LOW (ref 22–32)
Calcium: 8.6 mg/dL — ABNORMAL LOW (ref 8.9–10.3)
Chloride: 109 mmol/L (ref 98–111)
Creatinine, Ser: 1.05 mg/dL (ref 0.61–1.24)
GFR calc Af Amer: >60 mL/min (ref >60)
GFR calc non Af Amer: >60 mL/min (ref >60)
Glucose, Bld: 212 mg/dL — ABNORMAL HIGH (ref 70–99)
Potassium: 4 mmol/L (ref 3.5–5.1)
Sodium: 139 mmol/L (ref 135–145)

## 2019-08-25 LAB — GLUCOSE, CAPILLARY: Glucose-Capillary: 185 mg/dL — ABNORMAL HIGH (ref 70–99)

## 2019-08-25 MED ORDER — ASPIRIN 81 MG PO TBEC: 81.0000 mg | DELAYED_RELEASE_TABLET | Freq: Every day | ORAL | Status: AC

## 2019-08-25 MED ORDER — HYDROCODONE-ACETAMINOPHEN 5-325 MG PO TABS: 1.0000 | ORAL_TABLET | Freq: Four times a day (QID) | ORAL | 0 refills | Status: DC | PRN

## 2019-08-25 NOTE — Progress Notes (Addendum)
  Progress Note    08/25/2019 7:13 AM 1 Day Post-Op  Subjective:  Denies stroke like symptoms overnight including slurring speech, changes in vision, or one sided weakness.  Ready for discharge home.   Vitals:   08/25/19 0400 08/25/19 0600  BP: (!) 121/50 (!) 116/55  Pulse: (!) 47 (!) 54  Resp: 16 15  Temp: 98.6 F (37 C) 98.4 F (36.9 C)  SpO2: 100% 100%   Physical Exam: Lungs:  Non labored Incisions:  R neck incision soft Extremities:  Moving all extremities well Neurologic: CN grossly intact  CBC    Component Value Date/Time   WBC 13.0 (H) 08/25/2019 0328   RBC 3.86 (L) 08/25/2019 0328   HGB 11.2 (L) 08/25/2019 0328   HCT 34.8 (L) 08/25/2019 0328   PLT 280 08/25/2019 0328   MCV 90.2 08/25/2019 0328   MCH 29.0 08/25/2019 0328   MCHC 32.2 08/25/2019 0328   RDW 15.1 08/25/2019 0328   LYMPHSABS 2.3 10/15/2018 1156   MONOABS 0.6 10/15/2018 1156   EOSABS 0.1 10/15/2018 1156   BASOSABS 0.0 10/15/2018 1156    BMET    Component Value Date/Time   NA 139 08/25/2019 0328   K 4.0 08/25/2019 0328   CL 109 08/25/2019 0328   CO2 20 (L) 08/25/2019 0328   GLUCOSE 212 (H) 08/25/2019 0328   BUN 20 08/25/2019 0328   CREATININE 1.05 08/25/2019 0328   CALCIUM 8.6 (L) 08/25/2019 0328   GFRNONAA >60 08/25/2019 0328   GFRAA >60 08/25/2019 0328    INR    Component Value Date/Time   INR 0.9 08/18/2019 1001     Intake/Output Summary (Last 24 hours) at 08/25/2019 0713 Last data filed at 08/25/2019 0200 Gross per 24 hour  Intake 1923.33 ml  Output 1175 ml  Net 748.33 ml     Assessment/Plan:  69 y.o. male is s/p R CEA for asymptomatic stenosis 1 Day Post-Op   Neuro exam remains at baseline Will need to void prior to d/c this morning Follow up with Dr. Carlis Abbott in 3 weeks   Dagoberto Ligas, PA-C [facility] [phone] 08/25/2019 7:13 AM  I have seen and evaluated the patient. I agree with the PA note as documented above. POD#1 s/p R CEA for  asymptomatic high grade stenosis.  Neuro intact.  Tongue midline.  Neck looks good.  Plan for d/c home today.  Continue plavix and aspirin.  Will arrange follow-up in 2-3 weeks in clinic.  Marty Heck, MD [facility] [place] Office: [phone]

## 2019-08-25 NOTE — Progress Notes (Signed)
Patient complaining of right neck surgical pain unrelieved by tylenol.Dr. Oneida Alar notified new orders received.

## 2019-08-25 NOTE — Consult Note (Signed)
   [facility] Inpatient Consult   08/25/2019  SCHAFER STUDER [birth year] [id number]   [facility] Active Status: Active  [employer] CSNP  Patient is active with RN Chronic Care Coordinator.  Already aware of admission  Plan:  Post hospital follow up by the RN Chronic Care Coordinator.  Natividad Brood, RN BSN [facility] Liaison  [phone] business mobile phone Toll free office [phone]  Fax number: [phone] [email or web address]

## 2019-08-25 NOTE — Discharge Instructions (Signed)
   [facility]  Discharge Instructions   Carotid Endarterectomy (CEA)  Please refer to the following instructions for your post-procedure care. Your surgeon or physician assistant will discuss any changes with you.  Activity  You are encouraged to walk as much as you can. You can slowly return to normal activities but must avoid strenuous activity and heavy lifting until your doctor tell you it's OK. Avoid activities such as vacuuming or swinging a golf club. You can drive after one week if you are comfortable and you are no longer taking prescription pain medications. It is normal to feel tired for serval weeks after your surgery. It is also normal to have difficulty with sleep habits, eating, and bowel movements after surgery. These will go away with time.  Bathing/Showering  You may shower after you come home. Do not soak in a bathtub, hot tub, or swim until the incision heals completely.  Incision Care  Shower every day. Clean your incision with mild soap and water. Pat the area dry with a clean towel. You do not need a bandage unless otherwise instructed. Do not apply any ointments or creams to your incision. You may have skin glue on your incision. Do not peel it off. It will come off on its own in about one week. Your incision may feel thickened and raised for several weeks after your surgery. This is normal and the skin will soften over time. For Men Only: It's OK to shave around the incision but do not shave the incision itself for 2 weeks. It is common to have numbness under your chin that could last for several months.  Diet  Resume your normal diet. There are no special food restrictions following this procedure. A low fat/low cholesterol diet is recommended for all patients with vascular disease. In order to heal from your surgery, it is CRITICAL to get adequate nutrition. Your body requires vitamins, minerals, and protein. Vegetables are the best  source of vitamins and minerals. Vegetables also provide the perfect balance of protein. Processed food has little nutritional value, so try to avoid this.        Medications  Resume taking all of your medications unless your doctor or physician assistant tells you not to. If your incision is causing pain, you may take over-the- counter pain relievers such as acetaminophen (Tylenol). If you were prescribed a stronger pain medication, please be aware these medications can cause nausea and constipation. Prevent nausea by taking the medication with a snack or meal. Avoid constipation by drinking plenty of fluids and eating foods with a high amount of fiber, such as fruits, vegetables, and grains. Do not take Tylenol if you are taking prescription pain medications.  Follow Up  Our office will schedule a follow up appointment 2-3 weeks following discharge.  Please call us immediately for any of the following conditions  Increased pain, redness, drainage (pus) from your incision site. Fever of 101 degrees or higher. If you should develop stroke (slurred speech, difficulty swallowing, weakness on one side of your body, loss of vision) you should call 911 and go to the nearest emergency room.  Reduce your risk of vascular disease:  Stop smoking. If you would like help call [facility] at 1-800-QUIT-NOW (1-800-784-8669) or [place] at 336-586-4000. Manage your cholesterol Maintain a desired weight Control your diabetes Keep your blood pressure down  If you have any questions, please call the office at [phone].   

## 2019-08-25 NOTE — Patient Outreach (Signed)
  [facility]) Care Management Chronic Special Needs Program    08/25/2019  Name: Luis Lindsey, DOB: [birth year]  MRN: [id number]   [name] is enrolled in a chronic special needs plan for Diabetes. Client admitted on 08/24/19 for Rt carotid endarterectomy. Individualized care plan sent to [facility] for admission  [facility] will continue to follow.  [name] RN, Jackquline Denmark, CDE Chronic Care Management Coordinator [facility] [phone]

## 2019-08-25 NOTE — Progress Notes (Signed)
Discharge instructions provided to patient. All medications, follow up appointments, and discharge instructions provided. IV out. Monitor off CCMD notified. Patient is discharging to home via daughter.  Paulene Floor, RN

## 2019-08-25 NOTE — Discharge Summary (Signed)
Discharge Summary     Luis Lindsey 20-Dec-1950 69 y.o. male  [id number]  Admission Date: 08/24/2019  Discharge Date: 08/25/19  Physician: Dr. Carlis Abbott  Admission Diagnosis: Carotid stenosis, asymptomatic, right [I65.21]  Discharge Day services:   See progress note 08/25/2019  Hospital Course:  The patient was admitted to the hospital and taken to the operating room on 08/24/2019 and underwent right carotid endarterectomy.  The pt tolerated the procedure well and was transported to the PACU in good condition.   By POD 1, the pt neuro status remained at baseline.  He will follow-up with Dr. Carlis Abbott in office in about 3 weeks.  He will be prescribed 2 to 3 days of narcotic pain medication for continued postoperative pain control.  He will be discharged home today in stable condition.   Recent Labs    08/25/19 0328  NA 139  K 4.0  CL 109  CO2 20*  GLUCOSE 212*  BUN 20  CALCIUM 8.6*   Recent Labs    08/25/19 0328  WBC 13.0*  HGB 11.2*  HCT 34.8*  PLT 280   No results for input(s): INR in the last 72 hours.     Discharge Diagnosis:  Carotid stenosis, asymptomatic, right [I65.21]  Secondary Diagnosis: Patient Active Problem List   Diagnosis Date Noted  . Carotid stenosis, asymptomatic, right 08/24/2019  . Carotid disease, bilateral ([place]) 08/11/2019  . S/P lumbar laminectomy 08/01/2018  . PVD (peripheral vascular disease) ([place]) 06/10/2018  . Left lumbar radiculopathy 04/01/2018  . Peripheral neuropathy 04/01/2018  . Left leg claudication ([place]) 04/01/2018  . Status post arthroscopy of right shoulder 06/27/2017  . 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 Chronic right shoulder pain 04/16/2017  . Displaced fracture of proximal phalanx of right great toe with routine healing 03/13/2017  . Impingement syndrome of right shoulder 03/13/2017  . Displaced fracture of proximal phalanx of right great toe, initial encounter for closed fracture  02/20/2017  . Diabetes ([place]) 08/24/2013  . History of Holter monitoring   . Heart murmur   . TIA (transient ischemic attack) 12/26/2012  . Kidney stones   . DM 11/30/2009  . HYPERCHOLESTEROLEMIA 11/30/2009  . SMOKER 11/30/2009  . HYPERTENSION 11/30/2009  . URINARY CALCULUS 11/30/2009  . CAROTID BRUIT, RIGHT 11/30/2009   Past Medical History:  Diagnosis Date  . Arthritis   . BCC (basal cell carcinoma)   . BPH (benign prostatic hyperplasia)   . CAROTID BRUIT, RIGHT 11/30/2009  . DM 11/30/2009  . GERD (gastroesophageal reflux disease)   . Heart murmur   . History of hiatal hernia   . History of Holter monitoring 2009  . History of kidney stones   . HNP (herniated nucleus pulposus), lumbar    L4-5  . HYPERCHOLESTEROLEMIA 11/30/2009  . HYPERTENSION 11/30/2009  . Kidney stones   . Peripheral vascular disease ([place])   . Right carotid bruit   . SMOKER 11/30/2009  . Stroke ([place]) 2012  . URINARY CALCULUS 11/30/2009    Allergies as of 08/25/2019      Reactions   Liraglutide Nausea And Vomiting   Vomiting--patient denies this intolerance/allergy   Codeine Nausea Only      Medication List    TAKE these medications   amLODipine-benazepril 10-40 MG capsule Commonly known as: LOTREL Take 1 capsule by mouth daily.   aspirin 81 MG EC tablet Take 1 tablet (81 mg total) by mouth daily at 6 (six) AM. Start taking on: August 26, 2019  clopidogrel 75 MG tablet Commonly known as: PLAVIX Take 1 tablet (75 mg total) by mouth daily.   gabapentin 300 MG capsule Commonly known as: NEURONTIN Take 1 capsule (300 mg total) by mouth 3 (three) times daily.   hydrochlorothiazide 25 MG tablet Commonly known as: HYDRODIURIL Take 25 mg by mouth daily.   HYDROcodone-acetaminophen 5-325 MG tablet Commonly known as: NORCO/VICODIN Take 1 tablet by mouth every 6 (six) hours as needed for moderate pain.   metFORMIN 500 MG 24 hr tablet Commonly known as: GLUCOPHAGE-XR Take 1,000 mg by mouth 2 (two)  times daily.   metoprolol succinate 50 MG 24 hr tablet Commonly known as: TOPROL-XL Take 50 mg by mouth daily. Take with or immediately following a meal.   pantoprazole 40 MG tablet Commonly known as: PROTONIX Take 40 mg by mouth daily with breakfast.   pioglitazone 45 MG tablet Commonly known as: ACTOS TAKE 1 TABLET DAILY (NEED APPOINTMENT FOR FURTHER REFILLS) What changed: See the new instructions.   repaglinide 2 MG tablet Commonly known as: PRANDIN Take 2 mg by mouth 3 (three) times daily before meals.   rosuvastatin 40 MG tablet Commonly known as: CRESTOR Take 40 mg by mouth daily.   Soliqua 100-33 UNT-MCG/ML Sopn Generic drug: Insulin Glargine-Lixisenatide Inject 60 Units into the skin every morning.        Discharge Instructions:   [facility] Discharge Instructions Carotid Endarterectomy (CEA)  Please refer to the following instructions for your post-procedure care. Your surgeon or physician assistant will discuss any changes with you.  Activity  You are encouraged to walk as much as you can. You can slowly return to normal activities but must avoid strenuous activity and heavy lifting until your doctor tell you it's OK. Avoid activities such as vacuuming or swinging a golf club. You can drive after one week if you are comfortable and you are no longer taking prescription pain medications. It is normal to feel tired for serval weeks after your surgery. It is also normal to have difficulty with sleep habits, eating, and bowel movements after surgery. These will go away with time.  Bathing/Showering  You may shower after you come home. Do not soak in a bathtub, hot tub, or swim until the incision heals completely.  Incision Care  Shower every day. Clean your incision with mild soap and water. Pat the area dry with a clean towel. You do not need a bandage unless otherwise instructed. Do not apply any ointments or creams to your  incision. You may have skin glue on your incision. Do not peel it off. It will come off on its own in about one week. Your incision may feel thickened and raised for several weeks after your surgery. This is normal and the skin will soften over time. For Men Only: It's OK to shave around the incision but do not shave the incision itself for 2 weeks. It is common to have numbness under your chin that could last for several months.  Diet  Resume your normal diet. There are no special food restrictions following this procedure. A low fat/low cholesterol diet is recommended for all patients with vascular disease. In order to heal from your surgery, it is CRITICAL to get adequate nutrition. Your body requires vitamins, minerals, and protein. Vegetables are the best source of vitamins and minerals. Vegetables also provide the perfect balance of protein. Processed food has little nutritional value, so try to avoid this.  Medications  Resume taking  all of your medications unless your doctor or physician assistant tells you not to.  If your incision is causing pain, you may take over-the- counter pain relievers such as acetaminophen (Tylenol). If you were prescribed a stronger pain medication, please be aware these medications can cause nausea and constipation.  Prevent nausea by taking the medication with a snack or meal. Avoid constipation by drinking plenty of fluids and eating foods with a high amount of fiber, such as fruits, vegetables, and grains. Do not take Tylenol if you are taking prescription pain medications.  Follow Up  Our office will schedule a follow up appointment 2-3 weeks following discharge.  Please call us immediately for any of the following conditions  . Increased pain, redness, drainage (pus) from your incision site. . Fever of 101 degrees or higher. . If you should develop stroke (slurred speech, difficulty swallowing, weakness on one side of your body, loss of vision) you should  call 911 and go to the nearest emergency room. .  Reduce your risk of vascular disease:  . Stop smoking. If you would like help call [facility] at 1-800-QUIT-NOW 614 277 2477) or [place] at [phone]. . Manage your cholesterol . Maintain a desired weight . Control your diabetes . Keep your blood pressure down .  If you have any questions, please call the office at [phone].   Disposition: home  Patient's condition: is Good  Follow up: 1. Dr. Carlis Abbott in 3 weeks.   Dagoberto Ligas, PA-C [facility] [phone]   --- For [facility] Registry use ---   Modified Rankin score at D/C (0-6): 0  IV medication needed for:  1. Hypertension: No 2. Hypotension: No  Post-op Complications: No  1. Post-op CVA or TIA: No  If yes: Event classification (right eye, left eye, right cortical, left cortical, verterobasilar, other):   If yes: Timing of event (intra-op, <6 hrs post-op, >=6 hrs post-op, unknown):   2. CN injury: No  If yes: CN  injuried   3. Myocardial infarction: No  If yes: Dx by (EKG or clinical, Troponin):   4.  CHF: No  5.  Dysrhythmia (new): No  6. Wound infection: No  7. Reperfusion symptoms: No  8. Return to OR: No  If yes: return to OR for (bleeding, neurologic, other CEA incision, other):   Discharge medications: Statin use:  Yes ASA use:  Yes   Beta blocker use:  No ACE-Inhibitor use:  Yes  ARB use:  No CCB use: Yes P2Y12 Antagonist use: Yes, [x ] Plavix, [ ]  Plasugrel, [ ]  Ticlopinine, [ ]  Ticagrelor, [ ]  Other, [ ]  No for medical reason, [ ]  Non-compliant, [ ]  Not-indicated Anti-coagulant use:  No, [ ]  Warfarin, [ ]  Rivaroxaban, [ ]  Dabigatran,

## 2019-08-26 ENCOUNTER — Other Ambulatory Visit: Payer: Self-pay

## 2019-08-26 LAB — POCT ACTIVATED CLOTTING TIME: Activated Clotting Time: 290 seconds

## 2019-08-26 NOTE — Patient Outreach (Signed)
[facility]) Care Management Chronic Special Needs Program  08/26/2019  Name: Luis Lindsey DOB: 1951-03-11  MRN: [id number]  Luis Lindsey is enrolled in a chronic special needs plan for  Diabetes.  Client called with no answer and unable to leave HIPAA compliant message.  3rd attempt A completed 2021 health risk assessment has been received from the client and client has not responded to 3 outreach attempts by [facility]   The client's individualized care plan was developed based on available data and  2021 Health Risk Assessment Client was admitted to  [facility] on 08/24/19 and discharged on 08/25/19 after having a rt carotid endarterectomy Goals Addressed            This Visit's Progress   . Client will not report change from baseline and no repeated symptoms of stroke with in the next 12 months(continue 08/26/19       No admission or ED visit for stroke Plan to keep follow up appointment with vascular surgeon Sent EMMI: Signs of stroke    . 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 Client will report no fall or injuries in the next 6 months.(continue 08/26/19)       Unable to assess Stand up slowly Use cane or walker at all times Use grabber to pick up objects on the floor Keep home well-lit and wear your glasses  Sent EMMI: Preventing falls    . Client will verbalize knowledge of self management of Hypertension as evidences by BP reading of 140/90 or less; or as defined by provider   On track    B/P varies when checked Plan to check B/P regularly Take B/P medications as ordered Plan to follow a low salt diet  Increase activity as tolerated Sent EMMI: High Blood Pressure (Hypertension) What you can do     . Decrease inpatient admissions/ readmissions with in the next year   Not on track    Scheduled admission on 08/24/19 for rt  endarterectomy     . General - Client will not be readmitted within 30 days (C-SNP)discharged 08/25/19       Please follow discharge instructions and call provider if you have any questions. Please attend all follow up appointments as scheduled. Please take your medications as prescribed. Please call 24 Hour nurse advice line as needed [phone]).     [name] HEMOGLOBIN A1C < 7.0        Last Hemoglobin A1C 9.2% on 08/18/19 Plan to check blood sugars as directed with goals fasting or 1 1/2 hours after eating with goal of 80-130 fasting and 180 or less after meals Plan to follow a low carbohydrate, low salt diet, watch portion sizes and avoid sugar sweetened drinks    . Maintain timely refills of diabetic medication as prescribed within the year .   On track    Maintaining timely refills of medications per dispense report It is important to get your medications refilled on time    . Obtain annual  Lipid Profile, LDL-C   On track    Last completed 09/10/18 LDL 65 The goal for LDL is less than 70 mg/dL as you are at high risk for complications Plan to take statin as ordered    . Obtain Annual Eye (retinal)  Exam    Not on track  Plan to call and schedule eye exam    . Obtain Annual Foot Exam   On track    Last completed 09/01/18 Check your skin and feet every day for cuts, bruises, redness, blisters, or sores. Report to provider any problems with your feet Schedule a foot exam with your health care provider once every year    . Obtain annual screen for micro albuminuria (urine) , nephropathy (kidney problems)   On track    Last completed 09/10/18 It is important for your doctor to check your urine for protein at least every year    . Obtain Hemoglobin A1C at least 2 times per year   On track    Completed 08/18/19 It is important to have your Hemoglobin A1C checked every 6 months if you are at goal and every 3 months if you are not at goal    . Visit Primary Care Provider or  Endocrinologist at least 2 times per year    On track    Primary care provider 03/03/19 Annual Wellness visit 09/01/18 Please schedule your annual wellness visit      Transition of care program to be completed by [facility] general discharge   Client is active with [place] pharmacy assistance  Plan:  . Send unsuccessful outreach letter with a copy of individualized care plan to client . Send individualized care plan to provider . Educational Materials EMMI-High Blood Pressure(Hypertension): What you can do, Preventing falls, Signs of Stroke  Chronic care management coordinator will attempt outreach in 1 month.  [name] RN, Jackquline Denmark, CDE Chronic Care Management Coordinator [place] [facility] [phone]

## 2019-09-04 ENCOUNTER — Other Ambulatory Visit (HOSPITAL_COMMUNITY): Payer: Self-pay | Admitting: Family Medicine

## 2019-09-04 DIAGNOSIS — R011 Cardiac murmur, unspecified: Secondary | ICD-10-CM

## 2019-09-04 DIAGNOSIS — I10 Essential (primary) hypertension: Secondary | ICD-10-CM | POA: Diagnosis not present

## 2019-09-04 DIAGNOSIS — K219 Gastro-esophageal reflux disease without esophagitis: Secondary | ICD-10-CM | POA: Diagnosis not present

## 2019-09-04 DIAGNOSIS — E1142 Type 2 diabetes mellitus with diabetic polyneuropathy: Secondary | ICD-10-CM | POA: Diagnosis not present

## 2019-09-04 DIAGNOSIS — E782 Mixed hyperlipidemia: Secondary | ICD-10-CM | POA: Diagnosis not present

## 2019-09-04 DIAGNOSIS — R42 Dizziness and giddiness: Secondary | ICD-10-CM | POA: Diagnosis not present

## 2019-09-04 DIAGNOSIS — R251 Tremor, unspecified: Secondary | ICD-10-CM | POA: Diagnosis not present

## 2019-09-04 DIAGNOSIS — Z125 Encounter for screening for malignant neoplasm of prostate: Secondary | ICD-10-CM | POA: Diagnosis not present

## 2019-09-10 ENCOUNTER — Telehealth: Payer: Self-pay | Admitting: Pharmacist

## 2019-09-10 NOTE — Patient Outreach (Signed)
[facility] ([facility])  [facility] Quality Pharmacy Team    [facility] pharmacy case will be closed as our team is transitioning from the [facility] Care Management Department into the [facility] Quality Department and will no longer be using [facility] for documentation purposes.      Craig Wisnewski J. Coda Mathey, PharmD, BCACP [facility] Clinical Pharmacist [phone]   

## 2019-09-10 NOTE — Telephone Encounter (Signed)
-----   Message from Jason Fila, CPhT sent at 09/09/2019 12:02 PM EDT ----- Case is being closed. If he mails/sends in the required informatin as per this encounter then I will submit the information and gladly re open the case. Sharee Pimple

## 2019-09-14 ENCOUNTER — Telehealth (HOSPITAL_COMMUNITY): Payer: Self-pay

## 2019-09-14 NOTE — Telephone Encounter (Signed)

## 2019-09-15 ENCOUNTER — Other Ambulatory Visit: Payer: Self-pay

## 2019-09-15 ENCOUNTER — Encounter: Payer: Self-pay | Admitting: Vascular Surgery

## 2019-09-15 ENCOUNTER — Ambulatory Visit (INDEPENDENT_AMBULATORY_CARE_PROVIDER_SITE_OTHER): Payer: Self-pay | Admitting: Vascular Surgery

## 2019-09-15 ENCOUNTER — Other Ambulatory Visit: Payer: Self-pay | Admitting: Pharmacy Technician

## 2019-09-15 VITALS — BP 127/59 | HR 59 | Temp 97.3°F | Resp 18 | Ht 70.0 in | Wt 175.0 lb

## 2019-09-15 DIAGNOSIS — I6523 Occlusion and stenosis of bilateral carotid arteries: Secondary | ICD-10-CM

## 2019-09-15 NOTE — Progress Notes (Signed)
Patient name: Luis Lindsey MRN: [id number] DOB: Sep 06, 1950 Sex: male  REASON FOR VISIT: Postop check after right carotid endarterectomy  HPI: Luis Lindsey is a 69 y.o. male with multiple medical problems that presents for postop check after right carotid endarterectomy.  He had a right carotid on 08/24/2019 for asymptomatic high-grade stenosis.  He had no significant disease on the left side which is the contralateral side.  States he is recovering well from surgery with no problems with the incision.  No new neurologic symptoms.  Remains on aspirin and Plavix.  States his left leg is doing okay and previously had a left iliac stent placed.  Concern is back pain and he is seeing Dr. Ronnald Ramp with neurosurgery.  Past Medical History:  Diagnosis Date  . Arthritis   . BCC (basal cell carcinoma)   . BPH (benign prostatic hyperplasia)   . CAROTID BRUIT, RIGHT 11/30/2009  . DM 11/30/2009  . GERD (gastroesophageal reflux disease)   . Heart murmur   . History of hiatal hernia   . History of Holter monitoring 2009  . History of kidney stones   . HNP (herniated nucleus pulposus), lumbar    L4-5  . HYPERCHOLESTEROLEMIA 11/30/2009  . HYPERTENSION 11/30/2009  . Kidney stones   . Peripheral vascular disease ([place])   . Right carotid bruit   . SMOKER 11/30/2009  . Stroke ([place]) 2012  . URINARY CALCULUS 11/30/2009    Past Surgical History:  Procedure Laterality Date  . ABDOMINAL AORTOGRAM W/LOWER EXTREMITY N/A 06/18/2018   Procedure: ABDOMINAL AORTOGRAM W/LOWER EXTREMITY;  Surgeon: Marty Heck, MD;  Location: [facility];  Service: Cardiovascular;  Laterality: N/A;  . arthroscopy of right shoulder Right    Jan 2019, June 2019-- done at [facility], Dr. Ninfa Linden  . BACK SURGERY    . CATARACT EXTRACTION W/ INTRAOCULAR LENS  IMPLANT, BILATERAL    . CYSTOSCOPY    . ENDARTERECTOMY Right 08/24/2019   Procedure: ENDARTERECTOMY CAROTID;  Surgeon: Marty Heck, MD;  Location:  [facility];  Service: Vascular;  Laterality: Right;  . FACIAL COSMETIC SURGERY  2014  . FINGER SURGERY  2002  . LUMBAR LAMINECTOMY/DECOMPRESSION MICRODISCECTOMY Left 08/01/2018   Procedure: Microdiscectomy - Lumbar four-Lumbar five - left;  Surgeon: Eustace Moore, MD;  Location: [facility];  Service: Neurosurgery;  Laterality: Left;  . LUMBAR LAMINECTOMY/DECOMPRESSION MICRODISCECTOMY Left 10/15/2018   Procedure: Re-do Microdiscectomy - left - Lumbar four-Lumbar five;  Surgeon: Eustace Moore, MD;  Location: [facility];  Service: Neurosurgery;  Laterality: Left;  . NECK SURGERY  1986  . PERIPHERAL VASCULAR INTERVENTION  06/18/2018   Procedure: PERIPHERAL VASCULAR INTERVENTION;  Surgeon: Marty Heck, MD;  Location: [facility];  Service: Cardiovascular;;  Left external iliac  . ROTATOR CUFF REPAIR Left   . UMBILICAL HERNIA REPAIR      Family History  Problem Relation Age of Onset  . Diabetes Sister   . Diabetes Mellitus I Sister   . Stroke Father   . Heart disease Father   . CVA Father   . Coronary artery disease Father   . Diabetes Mother   . Heart disease Mother   . Diabetes Mellitus I Mother   . Coronary artery disease Mother   . Coronary artery disease Brother   . Heart disease Brother   . Coronary artery disease Brother     SOCIAL HISTORY: Social History   Tobacco Use  . Smoking status: Former Smoker  Years: 40.00    Types: Cigarettes    Quit date: 12/2015    Years since quitting: 3.7  . Smokeless tobacco: Never Used  . Tobacco comment: Quit 12-2012  Substance Use Topics  . Alcohol use: No    Allergies  Allergen Reactions  . Liraglutide Nausea And Vomiting    Vomiting--patient denies this intolerance/allergy  . Codeine Nausea Only    Current Outpatient Medications  Medication Sig Dispense Refill  . amLODipine-benazepril (LOTREL) 10-40 MG per capsule Take 1 capsule by mouth daily.      [name] aspirin EC 81 MG EC tablet Take 1 tablet (81 mg total) by mouth daily at  6 (six) AM.    . clopidogrel (PLAVIX) 75 MG tablet Take 1 tablet (75 mg total) by mouth daily. 90 tablet 4  . gabapentin (NEURONTIN) 300 MG capsule Take 1 capsule (300 mg total) by mouth 3 (three) times daily. 270 capsule 4  . hydrochlorothiazide (HYDRODIURIL) 25 MG tablet Take 25 mg by mouth daily.      [name] HYDROcodone-acetaminophen (NORCO/VICODIN) 5-325 MG tablet Take 1 tablet by mouth every 6 (six) hours as needed for moderate pain. 15 tablet 0  . metFORMIN (GLUCOPHAGE-XR) 500 MG 24 hr tablet Take 1,000 mg by mouth 2 (two) times daily.    . metoprolol succinate (TOPROL-XL) 50 MG 24 hr tablet Take 50 mg by mouth daily. Take with or immediately following a meal.    . pantoprazole (PROTONIX) 40 MG tablet Take 40 mg by mouth daily with breakfast.     . pioglitazone (ACTOS) 45 MG tablet TAKE 1 TABLET DAILY (NEED APPOINTMENT FOR FURTHER REFILLS) (Patient taking differently: Take 45 mg by mouth daily. ) 90 tablet 0  . repaglinide (PRANDIN) 2 MG tablet Take 2 mg by mouth 3 (three) times daily before meals.     . rosuvastatin (CRESTOR) 40 MG tablet Take 40 mg by mouth daily.     Willeen Niece 100-33 UNT-MCG/ML SOPN Inject 60 Units into the skin every morning.      No current facility-administered medications for this visit.    REVIEW OF SYSTEMS:  [X]  denotes positive finding, [ ]  denotes negative finding Cardiac  Comments:  Chest pain or chest pressure:    Shortness of breath upon exertion:    Short of breath when lying flat:    Irregular heart rhythm:        Vascular    Pain in calf, thigh, or hip brought on by ambulation:    Pain in feet at night that wakes you up from your sleep:     Blood clot in your veins:    Leg swelling:         Pulmonary    Oxygen at home:    Productive cough:     Wheezing:         Neurologic    Sudden weakness in arms or legs:     Sudden numbness in arms or legs:     Sudden onset of difficulty speaking or slurred speech:    Temporary loss of vision in one eye:      Problems with dizziness:         Gastrointestinal    Blood in stool:     Vomited blood:         Genitourinary    Burning when urinating:     Blood in urine:        Psychiatric    Major depression:  Hematologic    Bleeding problems:    Problems with blood clotting too easily:        Skin    Rashes or ulcers:        Constitutional    Fever or chills:      PHYSICAL EXAM: Vitals:   09/15/19 0831 09/15/19 0834  BP: (!) 133/57 (!) 127/59  Pulse: (!) 59 (!) 59  Resp: 18   Temp: (!) 97.3 F (36.3 C)   TempSrc: Temporal   SpO2: 100%   Weight: 175 lb (79.4 kg)   Height: 5\' 10"  (1.778 m)     GENERAL: The patient is a well-nourished male, in no acute distress. The vital signs are documented above. CARDIAC: There is a regular rate and rhythm.  VASCULAR:  Right neck incision healing well without any swelling or other issues Neuro: No deficits.  CN II-XII grossly intact.   DATA:   None  Assessment/Plan:  69 year old male doing well status post right carotid endarterectomy on 08/24/2019 for an asymptomatic high-grade stenosis.  He has recovered well from surgery and neck incision looks good and he is neurologically intact.  He had no significant contralateral disease.  Discussed I will have him follow-up in 9 months with a carotid duplex for ongoing surveillance.  At that time will also get ABIs and the left iliac duplex for surveillance of his left iliac stent.  This was most recently studied earlier this year with no evidence of significant stenosis or other issues.  Look forward to seeing him in 9 months.   Marty Heck, MD [facility] Office: [phone]

## 2019-09-15 NOTE — Patient Outreach (Signed)
[facility] [facility]) Care Management  09/15/2019  Luis Lindsey 1951-01-04 [id number]  Received patient portion of the denial letter for LIS for Tresiba with [employer]. Faxed required document into [employer].  Will follow up with company in 3-5 business days to check status of application.  Luis Lindsey, [name]  [phone]

## 2019-09-16 ENCOUNTER — Other Ambulatory Visit: Payer: Self-pay | Admitting: *Deleted

## 2019-09-16 DIAGNOSIS — I739 Peripheral vascular disease, unspecified: Secondary | ICD-10-CM

## 2019-09-16 DIAGNOSIS — I6523 Occlusion and stenosis of bilateral carotid arteries: Secondary | ICD-10-CM

## 2019-09-21 ENCOUNTER — Other Ambulatory Visit: Payer: Self-pay | Admitting: Pharmacy Technician

## 2019-09-21 NOTE — Patient Outreach (Signed)
[place] [facility]) Care Management  09/21/2019  Luis Lindsey 11/19/1950 [id number]  Care coordination call placed to [place] in regards to patient's [place] application.  Spoke to Maxwell who informed patient was APPROVED 09/17/2019-04/26/2020. She informed medication would be delivered to the provider's office in the next 10-14 business days.  Will follow up with patient in 14-21 business days to confirm medication was received.  [name] P. Lene Mckay, [place]  [phone]

## 2019-09-22 ENCOUNTER — Ambulatory Visit (HOSPITAL_COMMUNITY): Payer: HMO | Attending: Internal Medicine

## 2019-09-22 ENCOUNTER — Other Ambulatory Visit: Payer: Self-pay

## 2019-09-22 DIAGNOSIS — R011 Cardiac murmur, unspecified: Secondary | ICD-10-CM | POA: Insufficient documentation

## 2019-09-29 ENCOUNTER — Other Ambulatory Visit: Payer: Self-pay

## 2019-09-29 NOTE — Patient Outreach (Signed)
[facility]) Care Management Chronic Special Needs Program  09/29/2019  Name: Luis Lindsey DOB: 19-Mar-1951  MRN: [id number]  Luis Lindsey is enrolled in a chronic special needs plan for Diabetes. Reviewed and updated care plan.  Subjective: Client states that he has recovered from his carotid surgery.  States he saw his surgeon a few weeks ago.  States he saw his primary care doctor on 09/04/19.  States his blood sugars range from 120-200 depending when he has eaten.  States he has a low reading about 1-2 times a month and they usually are if he did not eat enough.  States he is getting his [place] A[place] without issues with his pharmacy assistance.  States he is trying to walk daily for about 10-15 minutes.  States his B/P is good when it is checked at the doctors office.  States he has not called to schedule his eye appointment yet.  States he has not gotten his COVID shots and he has not decided if he will get them.  Goals Addressed            This Visit's Progress   . Client will not report change from baseline and no repeated symptoms of stroke with in the next 12 months(continue 08/26/19   On track    No admission or ED visit for stroke Plan to keep follow up appointment with vascular surgeon Reviewed stroke signs and symptoms and when to call 911    . Client will report abillity to obtain Medications within next 6 months   On track    Client has been approved for pharmacy assistance Reports no issues with affording medications at this time Goal completed 09/29/19    . Client will report no fall or injuries in the next 6 months.(continue 08/26/19)   On track    Reports no falls Stand up slowly Use cane or walker at all times Use grabber to pick up objects on the floor Keep home well-lit and wear your glasses      . Client will verbalize knowledge of self management of Hypertension as evidences by BP reading of 140/90 or less; or as defined by provider   On track   Reports B/P varies when checked Plan to check B/P regularly Take B/P medications as ordered Plan to follow a low salt diet  Increase activity as tolerated    . Decrease inpatient admissions/ readmissions with in the next year   On track    Client had scheduled admission on 08/24/19 for rt endarterectomy  No unplanned admissions    . Diabetes Patient stated goal to have annual eye exam (pt-stated)       Reinforced importance of having an annual dilated eye exam Contacted [employer] concierge to send client a list of in network providers     . COMPLETED: General - Client will not be readmitted within 30 days (C-SNP)discharged 08/25/19   On track    No readmission or ED visits 30 days post discharge Completed 09/29/19    . HEMOGLOBIN A1C < 7.0        Last Hemoglobin A1C 9.2% on 08/18/19 Check blood sugars daily either fasting or 1 1/2 hours after eating with goal of 80-130 fasting and 180 or less after meals Reviewed fasting blood sugar goals of 80-130 and less than 180 1 1/2-2 hours after meals Reinforced to follow a low carbohydrate low salt diet and to watch portion sizes Reviewed use and possible side effects of  diabetes medications  Reviewed signs and symptoms of hypoglycemia and actions to take Agreeable to referral to [employer] Health Coach for diet instruction    . Maintain timely refills of diabetic medication as prescribed within the year .   On track    Maintaining timely refills of medications per dispense report It is important to get your medications refilled on time Goal completed 09/29/19    . Obtain annual  Lipid Profile, LDL-C   On track    Last completed 09/04/19 LDL 71 The goal for LDL is less than 70 mg/dL as you are at high risk for complications Plan to take statin as ordered Goal completed 09/29/19    . Obtain Annual Eye (retinal)  Exam    Not on track    Plan to call and schedule eye exam    . Obtain Annual Foot Exam   On track     Last completed 09/01/18 Reports no sores or open areas on feet Check your skin and feet every day for cuts, bruises, redness, blisters, or sores. Report to provider any problems with your feet Schedule a foot exam with your health care provider once every year    . Obtain annual screen for micro albuminuria (urine) , nephropathy (kidney problems)   On track    Last completed 09/10/18 It is important for your doctor to check your urine for protein at least every year    . Obtain Hemoglobin A1C at least 2 times per year   On track    Completed 08/18/19 It is important to have your Hemoglobin A1C checked every 6 months if you are at goal and every 3 months if you are not at goal    . Visit Primary Care Provider or Endocrinologist at least 2 times per year    On track    Primary care provider 09/04/19 Annual Wellness visit 09/01/18 Please schedule your annual wellness visit     Client is not meeting diabetes self management goal of hemoglobin A1C of <7% with last reading of 9.2% Client agreeable to referral to [employer] Health Coach for assistance with diet adherence and referral sent Communicated with [employer] concierge to assist client with finding an in network eye provider  Reviewed number for [place] precautions Encouraged to sign up for [place] vaccination   Plan:  Send successful outreach letter with a copy of their individualized care plan, Send individual care plan to provider and Send educational material  Chronic care management coordinator will outreach in:  6 Months  Will refer to:  [employer] Faith RN, [facility] [place] Management [phone]

## 2019-10-01 ENCOUNTER — Ambulatory Visit: Payer: HMO | Attending: Internal Medicine

## 2019-10-01 DIAGNOSIS — Z23 Encounter for immunization: Secondary | ICD-10-CM

## 2019-10-01 NOTE — Progress Notes (Signed)
   Covid-19 Vaccination Clinic  Name:  Luis Lindsey    MRN: [id number] DOB: September 12, 1950  10/01/2019  [name] was observed post Covid-19 immunization for 15 minutes without incident. He was provided with Vaccine Information Sheet and instruction to access the V-Safe system.   [name] was instructed to call 911 with any severe reactions post vaccine: [name] Difficulty breathing  . Swelling of face and throat  . A fast heartbeat  . A bad rash all over body  . Dizziness and weakness   Immunizations Administered    Name Date Dose VIS Date [place] COVID-19 Vaccine 10/01/2019  9:10 AM 0.3 mL 07/22/2018 Intramuscular   Manufacturer: [place]   Lot: [id number]   [place]: [id number]

## 2019-10-02 DIAGNOSIS — M5136 Other intervertebral disc degeneration, lumbar region: Secondary | ICD-10-CM | POA: Diagnosis not present

## 2019-10-02 DIAGNOSIS — M5416 Radiculopathy, lumbar region: Secondary | ICD-10-CM | POA: Diagnosis not present

## 2019-10-07 ENCOUNTER — Other Ambulatory Visit: Payer: Self-pay | Admitting: Pharmacy Technician

## 2019-10-08 ENCOUNTER — Other Ambulatory Visit: Payer: Self-pay | Admitting: Pharmacy Technician

## 2019-10-08 NOTE — Patient Outreach (Signed)
[place] [facility]) Care Management  10/08/2019  Luis Lindsey 1950/06/04 [id number]    Unsuccessful call placed to patient regarding patient assistance medication delivery of Tresiba with [employer], HIPAA compliant voicemail left.  Was calling to inquire if patient received the medication and to discuss refill process.  Follow up:  Will follow up with patietn in 5-7 business days if call is not returned.  [name], [place]  [phone]

## 2019-10-08 NOTE — Patient Outreach (Signed)
[place] [facility]) Care Management  10/08/2019  Luis Lindsey Oct 10, 1950 [id number]   Successful call placed to patient regarding patient assistance medication delivery of Tresiba with [employer], HIPAA identifiers verified.   Patient informed he received and picked up the Tresiba from the provider's office. Discussed refill procedure with patient which will require him to phone the provider's office about a month before running of medication  for the request and to ensure they send it to the patient assistance company. [employer] will NOT accept refill requests from patients as they send the provider's office a fax reminder 80 days after each fill date to remind them to send in the necessary information.  Follow up:  Will remove myself from care team as patient assistnce has been completed.  Rose Hippler P. Saren Corkern, [place]  [phone]

## 2019-10-19 ENCOUNTER — Telehealth: Payer: Self-pay

## 2019-10-19 NOTE — Telephone Encounter (Signed)
Telephone call received from patient c/o left calf pain when standing x couple of weeks along with left foot swelling. Pain resolves with rest. He denies any redness, warmth, SOB or any other symptoms other than previous neuropathy in feet. Pt advised scheduling will contact him for studies and f/u appt with PA. Verbalized understanding. Minette Brine, RN

## 2019-10-22 DIAGNOSIS — M5416 Radiculopathy, lumbar region: Secondary | ICD-10-CM | POA: Diagnosis not present

## 2019-10-27 ENCOUNTER — Ambulatory Visit: Payer: HMO | Attending: Internal Medicine

## 2019-10-27 DIAGNOSIS — Z23 Encounter for immunization: Secondary | ICD-10-CM

## 2019-10-27 NOTE — Progress Notes (Signed)
   Covid-19 Vaccination Clinic  Name:  Luis Lindsey    MRN: [id number] DOB: [birth year]  10/27/2019  Luis Lindsey was observed post Covid-19 immunization for 15 minutes without incident. He was provided with Vaccine Information Sheet and instruction to access the V-Safe system.   Luis Lindsey was instructed to call 911 with any severe reactions post vaccine: [name] Difficulty breathing  . Swelling of face and throat  . A fast heartbeat  . A bad rash all over body  . Dizziness and weakness   Immunizations Administered    Name Date Dose VIS Date Route   Pfizer COVID-19 Vaccine 10/27/2019  8:33 AM 0.3 mL 07/22/2018 Intramuscular   Manufacturer: [place]   Lot: [id number]   [place]: [id number]

## 2019-11-09 ENCOUNTER — Telehealth: Payer: Self-pay

## 2019-11-09 NOTE — Telephone Encounter (Addendum)
Telephone call received from patient with continued complaints of left calf pain that hurts when initially standing or when walking from one room to the other. Pain improves with rest. He also reported left foot swelling and is not consistent with elevated leg. Pt was contacted after last triage call received on 10/19/19 for appt by Arby Barrette. But pt was unable to reach. Will have pt scheduled for an ABI + PA appt. Advised will contact pt with details. Voiced understanding.

## 2019-11-10 NOTE — Telephone Encounter (Signed)
Pt scheduled for ABI + PA on 11/25/19 at 1000/1045. Pt informed of appt date/time and voiced understanding.

## 2019-11-11 ENCOUNTER — Ambulatory Visit: Payer: Medicare Other | Admitting: Neurology

## 2019-11-19 DIAGNOSIS — M961 Postlaminectomy syndrome, not elsewhere classified: Secondary | ICD-10-CM | POA: Diagnosis not present

## 2019-11-19 DIAGNOSIS — M5416 Radiculopathy, lumbar region: Secondary | ICD-10-CM | POA: Diagnosis not present

## 2019-11-25 ENCOUNTER — Other Ambulatory Visit: Payer: Self-pay

## 2019-11-25 ENCOUNTER — Ambulatory Visit (INDEPENDENT_AMBULATORY_CARE_PROVIDER_SITE_OTHER): Payer: HMO | Admitting: Physician Assistant

## 2019-11-25 ENCOUNTER — Ambulatory Visit (HOSPITAL_COMMUNITY)
Admission: RE | Admit: 2019-11-25 | Discharge: 2019-11-25 | Disposition: A | Discharge status: Home / Self Care | Payer: HMO | Source: Ambulatory Visit | Attending: Vascular Surgery | Admitting: Vascular Surgery

## 2019-11-25 VITALS — BP 136/61 | HR 55 | Temp 97.8°F | Resp 20 | Ht 70.0 in | Wt 176.2 lb

## 2019-11-25 DIAGNOSIS — I739 Peripheral vascular disease, unspecified: Secondary | ICD-10-CM

## 2019-11-25 NOTE — Progress Notes (Signed)
Office Note     CC:  follow up left EIA stent now with LLE pain Requesting Provider:  Alroy Dust, [name], MD  HPI: Luis Lindsey is a 69 y.o. (04/18/1951) male who presents with LLE pain.  He describes this as left calf pain radiating proximally after walking approximately 50 feet.  This resolves with rest.  He cannot shop in the grocery store as before.  He notice this approximately 3 weeks ago.  Denies rest pain.  His left buttock pain from disk disease is at baseline.  Previously underwent left external iliac stent on 06/18/2018 for left lower extremity lifestyle limiting claudication.  At his visit with Dr. Carlis Abbott on August 11, 2019, he stated he saw some initial improvement after the stent was placed but then he also underwent L4-L5 microdiscectomy with Dr. Ronnald Ramp and had even more continued improvement.  He reported he got some tightness in his left calf but this was very tolerable. Arrangements were made for surveillance follow-up.  Recently underwent right carotid endarterectomy with Dr. Carlis Abbott for asymptomatic stenosis. He denies any new neurologic symptoms.  Compliant with aspirin and Plavix. Former smoker. +DM  Past Medical History:  Diagnosis Date  . Arthritis   . BCC (basal cell carcinoma)   . BPH (benign prostatic hyperplasia)   . CAROTID BRUIT, RIGHT 11/30/2009  . DM 11/30/2009  . GERD (gastroesophageal reflux disease)   . Heart murmur   . History of hiatal hernia   . History of Holter monitoring 2009  . History of kidney stones   . HNP (herniated nucleus pulposus), lumbar    L4-5  . HYPERCHOLESTEROLEMIA 11/30/2009  . HYPERTENSION 11/30/2009  . Kidney stones   . Peripheral vascular disease ([place])   . Right carotid bruit   . SMOKER 11/30/2009  . Stroke ([place]) 2012  . URINARY CALCULUS 11/30/2009    Past Surgical History:  Procedure Laterality Date  . ABDOMINAL AORTOGRAM W/LOWER EXTREMITY N/A 06/18/2018   Procedure: ABDOMINAL AORTOGRAM W/LOWER EXTREMITY;  Surgeon: Marty Heck, MD;  Location: [facility];  Service: Cardiovascular;  Laterality: N/A;  . arthroscopy of right shoulder Right    Jan 2019, June 2019-- done at [facility], Dr. Ninfa Linden  . BACK SURGERY    . CATARACT EXTRACTION W/ INTRAOCULAR LENS  IMPLANT, BILATERAL    . CYSTOSCOPY    . ENDARTERECTOMY Right 08/24/2019   Procedure: ENDARTERECTOMY CAROTID;  Surgeon: Marty Heck, MD;  Location: [facility];  Service: Vascular;  Laterality: Right;  . FACIAL COSMETIC SURGERY  2014  . FINGER SURGERY  2002  . LUMBAR LAMINECTOMY/DECOMPRESSION MICRODISCECTOMY Left 08/01/2018   Procedure: Microdiscectomy - Lumbar four-Lumbar five - left;  Surgeon: Eustace Moore, MD;  Location: [facility];  Service: Neurosurgery;  Laterality: Left;  . LUMBAR LAMINECTOMY/DECOMPRESSION MICRODISCECTOMY Left 10/15/2018   Procedure: Re-do Microdiscectomy - left - Lumbar four-Lumbar five;  Surgeon: Eustace Moore, MD;  Location: [facility];  Service: Neurosurgery;  Laterality: Left;  . NECK SURGERY  1986  . PERIPHERAL VASCULAR INTERVENTION  06/18/2018   Procedure: PERIPHERAL VASCULAR INTERVENTION;  Surgeon: Marty Heck, MD;  Location: [facility];  Service: Cardiovascular;;  Left external iliac  . ROTATOR CUFF REPAIR Left   . UMBILICAL HERNIA REPAIR      Social History   Socioeconomic History  . Marital status: Married    Spouse name: [name]   . Number of children: 3  . Years of education: 62  . Highest education level:  Not on file  Occupational History  . Occupation: Truck Education administrator: [employer]  Tobacco Use  . Smoking status: Former Smoker    Years: 40.00    Types: Cigarettes    Quit date: 12/2015    Years since quitting: 3.9  . Smokeless tobacco: Never Used  . Tobacco comment: Quit 12-2012  Vaping Use  . Vaping Use: Never used  Substance and Sexual Activity  . Alcohol use: No  . Drug use: No  . Sexual activity: Not on file  Other Topics Concern  . Not on file  Social History  Narrative   . Patient drinks 2-3 cups of caffeine daily.    Patient lives at home with his wife [name].    Patient works at [employer].    Education. 12 th grade   Right handed               Social Determinants of Health   Financial Resource Strain:   . Difficulty of Paying Living Expenses:   Food Insecurity: No Food Insecurity  . Worried About Charity fundraiser in the Last Year: Never true  . Ran Out of Food in the Last Year: Never true  Transportation Needs: No Transportation Needs  . Lack of Transportation (Medical): No  . Lack of Transportation (Non-Medical): No  Physical Activity:   . Days of Exercise per Week:   . Minutes of Exercise per Session:   Stress:   . Feeling of Stress :   Social Connections:   . Frequency of Communication with Friends and Family:   . Frequency of Social Gatherings with Friends and Family:   . Attends Religious Services:   . Active Member of Clubs or Organizations:   . Attends Archivist Meetings:   [name] Marital Status:   Intimate Partner Violence:   . Fear of Current or Ex-Partner:   . Emotionally Abused:   [name] Physically Abused:   . Sexually Abused:    Family History  Problem Relation Age of Onset  . Diabetes Sister   . Diabetes Mellitus I Sister   . Stroke Father   . Heart disease Father   . CVA Father   . Coronary artery disease Father   . Diabetes Mother   . Heart disease Mother   . Diabetes Mellitus I Mother   . Coronary artery disease Mother   . Coronary artery disease Brother   . Heart disease Brother   . Coronary artery disease Brother     Current Outpatient Medications  Medication Sig Dispense Refill  . amLODipine-benazepril (LOTREL) 10-40 MG per capsule Take 1 capsule by mouth daily.      [name] aspirin EC 81 MG EC tablet Take 1 tablet (81 mg total) by mouth daily at 6 (six) AM.    . clopidogrel (PLAVIX) 75 MG tablet Take 1 tablet (75 mg total) by mouth daily. 90 tablet 4  . gabapentin (NEURONTIN) 300 MG capsule Take  1 capsule (300 mg total) by mouth 3 (three) times daily. 270 capsule 4  . hydrochlorothiazide (HYDRODIURIL) 25 MG tablet Take 25 mg by mouth daily.      [name] HYDROcodone-acetaminophen (NORCO/VICODIN) 5-325 MG tablet Take 1 tablet by mouth every 6 (six) hours as needed for moderate pain. 15 tablet 0  . metFORMIN (GLUCOPHAGE-XR) 500 MG 24 hr tablet Take 1,000 mg by mouth 2 (two) times daily.    . metoprolol succinate (TOPROL-XL) 50 MG 24 hr tablet Take 50 mg by mouth daily.  Take with or immediately following a meal.    . pantoprazole (PROTONIX) 40 MG tablet Take 40 mg by mouth daily with breakfast.     . pioglitazone (ACTOS) 45 MG tablet TAKE 1 TABLET DAILY (NEED APPOINTMENT FOR FURTHER REFILLS) (Patient taking differently: Take 45 mg by mouth daily. ) 90 tablet 0  . repaglinide (PRANDIN) 2 MG tablet Take 2 mg by mouth 3 (three) times daily before meals.     . rosuvastatin (CRESTOR) 40 MG tablet Take 40 mg by mouth daily.     Willeen Niece 100-33 UNT-MCG/ML SOPN Inject 60 Units into the skin every morning.      No current facility-administered medications for this visit.    Allergies  Allergen Reactions  . Liraglutide Nausea And Vomiting    Vomiting--patient denies this intolerance/allergy  . Codeine Nausea Only     REVIEW OF SYSTEMS:   [X]  denotes positive finding, [ ]  denotes negative finding Cardiac  Comments:  Chest pain or chest pressure:    Shortness of breath upon exertion:    Short of breath when lying flat:    Irregular heart rhythm:        Vascular    Pain in calf, thigh, or hip brought on by ambulation: x   Pain in feet at night that wakes you up from your sleep:     Blood clot in your veins:    Leg swelling:  x  left foot after walking      Pulmonary    Oxygen at home:    Productive cough:     Wheezing:         Neurologic    Sudden weakness in arms or legs:     Sudden numbness in arms or legs:     Sudden onset of difficulty speaking or slurred speech:    Temporary  loss of vision in one eye:     Problems with dizziness:         Gastrointestinal    Blood in stool:     Vomited blood:         Genitourinary    Burning when urinating:     Blood in urine:        Psychiatric    Major depression:         Hematologic    Bleeding problems:    Problems with blood clotting too easily:        Skin    Rashes or ulcers:        Constitutional    Fever or chills:      PHYSICAL EXAMINATION:  Vitals:   11/25/19 1038  BP: 136/61  Pulse: (!) 55  Resp: 20  Temp: 97.8 F (36.6 C)  SpO2: 100%   General:  WDWN in NAD; vital signs documented above Gait: steady, no ataxia HENT: WNL, normocephalic Pulmonary: normal non-labored breathing , without Rales, rhonchi,  wheezing Cardiac: regular HR, without  Murmurs  Abdomen: soft, NT, no masses Skin: without rashes Vascular Exam/Pulses: Pedal and pop pulses not palpable. 2+ femoral pulses bilaterally Extremities: with chronic ischemic changes, without Gangrene , without cellulitis; without open wounds;  Musculoskeletal: no muscle wasting or atrophy  Neurologic: A&O X 3;  No focal weakness or paresthesias are detected Psychiatric:  The pt has Normal affect.   Non-Invasive Vascular Imaging:   ABI Findings:  +---------+------------------+-----+---------+--------+  Right  Rt Pressure (mmHg)IndexWaveform Comment   +---------+------------------+-----+---------+--------+  Brachial 157                      +---------+------------------+-----+---------+--------+  ATA   116        0.71            +---------+------------------+-----+---------+--------+  PTA   125        0.77 triphasic      +---------+------------------+-----+---------+--------+  DP                triphasic      +---------+------------------+-----+---------+--------+  Doristine Devoid Toe83        0.51              +---------+------------------+-----+---------+--------+   +---------+------------------+-----+----------+-------+  Left   Lt Pressure (mmHg)IndexWaveform Comment  +---------+------------------+-----+----------+-------+  Brachial 163                      +---------+------------------+-----+----------+-------+  ATA   95        0.58            +---------+------------------+-----+----------+-------+  PTA   98        0.60 monophasic      +---------+------------------+-----+----------+-------+  DP                monophasic      +---------+------------------+-----+----------+-------+  Great Toe52        0.32            +---------+------------------+-----+----------+-------+   +-------+-----------+-----------+------------+------------+  ABI/TBIToday's ABIToday's TBIPrevious ABIPrevious TBI  +-------+-----------+-----------+------------+------------+  Right 0.77    0.51    0.80    0.62      +-------+-----------+-----------+------------+------------+  Left  0.60    0.32    0.61    0.41      +-------+-----------+-----------+------------+------------+   Bilateral ABIs appear essentially unchanged compared to prior study on  08/11/2019.    Summary:  Right: Resting right ankle-brachial index indicates moderate right lower  extremity arterial disease. The right toe-brachial index is abnormal. RT  great toe pressure = 83 mmHg.   Left: Resting left ankle-brachial index indicates moderate left lower  extremity arterial disease. The left toe-brachial index is abnormal. LT  Great toe pressure = 52 mmHg.   ABI's/TBI's on 05/08/2019: Right:  0.77/0.50 Left:  0.56/0.41  ASSESSMENT/PLAN:: 69 y.o. male here for left lower extremity lifestyle limiting claudication.  ABIs unchanged, but with previous duplex showing stenosis proximal  to his left stent, feel arteriogram is warranted.  I informed the patient that we would arrange this and that I will discuss the assessment and plan with Dr. Carlis Abbott.  Barbie Banner, PA-C [facility] [phone]  Clinic MD:   Oneida Alar

## 2019-12-02 ENCOUNTER — Other Ambulatory Visit: Payer: Self-pay

## 2019-12-02 ENCOUNTER — Encounter (HOSPITAL_COMMUNITY)
Admission: RE | Disposition: A | Discharge status: Home / Self Care | Payer: Self-pay | Source: Home / Self Care | Attending: Vascular Surgery

## 2019-12-02 ENCOUNTER — Ambulatory Visit (HOSPITAL_COMMUNITY)
Admission: RE | Admit: 2019-12-02 | Discharge: 2019-12-02 | Disposition: A | Discharge status: Home / Self Care | Payer: HMO | Attending: Vascular Surgery | Admitting: Vascular Surgery

## 2019-12-02 DIAGNOSIS — E7800 Pure hypercholesterolemia, unspecified: Secondary | ICD-10-CM | POA: Insufficient documentation

## 2019-12-02 DIAGNOSIS — Z888 Allergy status to other drugs, medicaments and biological substances status: Secondary | ICD-10-CM | POA: Insufficient documentation

## 2019-12-02 DIAGNOSIS — Z885 Allergy status to narcotic agent status: Secondary | ICD-10-CM | POA: Diagnosis not present

## 2019-12-02 DIAGNOSIS — I10 Essential (primary) hypertension: Secondary | ICD-10-CM | POA: Diagnosis not present

## 2019-12-02 DIAGNOSIS — Z8249 Family history of ischemic heart disease and other diseases of the circulatory system: Secondary | ICD-10-CM | POA: Insufficient documentation

## 2019-12-02 DIAGNOSIS — Z7902 Long term (current) use of antithrombotics/antiplatelets: Secondary | ICD-10-CM | POA: Diagnosis not present

## 2019-12-02 DIAGNOSIS — J45990 Exercise induced bronchospasm: Secondary | ICD-10-CM | POA: Diagnosis not present

## 2019-12-02 DIAGNOSIS — Z79899 Other long term (current) drug therapy: Secondary | ICD-10-CM | POA: Insufficient documentation

## 2019-12-02 DIAGNOSIS — Z7982 Long term (current) use of aspirin: Secondary | ICD-10-CM | POA: Diagnosis not present

## 2019-12-02 DIAGNOSIS — Z87891 Personal history of nicotine dependence: Secondary | ICD-10-CM | POA: Insufficient documentation

## 2019-12-02 DIAGNOSIS — Z833 Family history of diabetes mellitus: Secondary | ICD-10-CM | POA: Diagnosis not present

## 2019-12-02 DIAGNOSIS — E1151 Type 2 diabetes mellitus with diabetic peripheral angiopathy without gangrene: Secondary | ICD-10-CM | POA: Diagnosis not present

## 2019-12-02 DIAGNOSIS — K219 Gastro-esophageal reflux disease without esophagitis: Secondary | ICD-10-CM | POA: Diagnosis not present

## 2019-12-02 DIAGNOSIS — M1990 Unspecified osteoarthritis, unspecified site: Secondary | ICD-10-CM | POA: Insufficient documentation

## 2019-12-02 DIAGNOSIS — Z8673 Personal history of transient ischemic attack (TIA), and cerebral infarction without residual deficits: Secondary | ICD-10-CM | POA: Diagnosis not present

## 2019-12-02 DIAGNOSIS — N400 Enlarged prostate without lower urinary tract symptoms: Secondary | ICD-10-CM | POA: Diagnosis not present

## 2019-12-02 DIAGNOSIS — I70212 Atherosclerosis of native arteries of extremities with intermittent claudication, left leg: Secondary | ICD-10-CM | POA: Diagnosis not present

## 2019-12-02 DIAGNOSIS — Z7984 Long term (current) use of oral hypoglycemic drugs: Secondary | ICD-10-CM | POA: Diagnosis not present

## 2019-12-02 HISTORY — PX: ABDOMINAL AORTOGRAM W/LOWER EXTREMITY: CATH118223

## 2019-12-02 HISTORY — PX: PERIPHERAL VASCULAR ATHERECTOMY: CATH118256

## 2019-12-02 LAB — POCT I-STAT, CHEM 8
BUN: 28 mg/dL — ABNORMAL HIGH (ref 8–23)
Calcium, Ion: 1.36 mmol/L (ref 1.15–1.40)
Chloride: 99 mmol/L (ref 98–111)
Creatinine, Ser: 1.1 mg/dL (ref 0.61–1.24)
Glucose, Bld: 230 mg/dL — ABNORMAL HIGH (ref 70–99)
HCT: 37 % — ABNORMAL LOW (ref 39.0–52.0)
Hemoglobin: 12.6 g/dL — ABNORMAL LOW (ref 13.0–17.0)
Potassium: 3.2 mmol/L — ABNORMAL LOW (ref 3.5–5.1)
Sodium: 140 mmol/L (ref 135–145)
TCO2: 29 mmol/L (ref 22–32)

## 2019-12-02 LAB — GLUCOSE, CAPILLARY: Glucose-Capillary: 189 mg/dL — ABNORMAL HIGH (ref 70–99)

## 2019-12-02 LAB — POCT ACTIVATED CLOTTING TIME
Activated Clotting Time: 285 seconds
Activated Clotting Time: 230 seconds
Activated Clotting Time: 180 seconds

## 2019-12-02 SURGERY — ABDOMINAL AORTOGRAM W/LOWER EXTREMITY
Anesthesia: LOCAL

## 2019-12-02 MED ORDER — VIPERSLIDE LUBRICANT OPTIME: TOPICAL | Status: DC | PRN

## 2019-12-02 MED ORDER — SODIUM CHLORIDE 0.9% FLUSH: 3.0000 mL | Freq: Two times a day (BID) | INTRAVENOUS | Status: DC

## 2019-12-02 MED ORDER — IODIXANOL 320 MG/ML IV SOLN
INTRAVENOUS | Status: DC | PRN
  Administered 2019-12-02: 160 mL via INTRA_ARTERIAL

## 2019-12-02 MED ORDER — HEPARIN (PORCINE) IN NACL 1000-0.9 UT/500ML-% IV SOLN
INTRAVENOUS | Status: DC | PRN
  Administered 2019-12-02 (×2): 500 mL

## 2019-12-02 MED ORDER — ONDANSETRON HCL 4 MG/2ML IJ SOLN: 4.0000 mg | Freq: Four times a day (QID) | INTRAMUSCULAR | Status: DC | PRN

## 2019-12-02 MED ORDER — NITROGLYCERIN 1 MG/10 ML FOR IR/CATH LAB
INTRA_ARTERIAL | Status: DC | PRN
  Administered 2019-12-02 (×2): 200 ug via INTRA_ARTERIAL

## 2019-12-02 MED ORDER — LABETALOL HCL 5 MG/ML IV SOLN: 10.0000 mg | INTRAVENOUS | Status: DC | PRN

## 2019-12-02 MED ORDER — VERAPAMIL HCL 2.5 MG/ML IV SOLN
INTRAVENOUS | Status: AC
  Filled 2019-12-02: qty 2

## 2019-12-02 MED ORDER — LIDOCAINE HCL (PF) 1 % IJ SOLN
INTRAMUSCULAR | Status: DC | PRN
  Administered 2019-12-02: 15 mL

## 2019-12-02 MED ORDER — SODIUM CHLORIDE 0.9 % IV SOLN: 250.0000 mL | INTRAVENOUS | Status: DC | PRN

## 2019-12-02 MED ORDER — SODIUM CHLORIDE 0.9 % IV SOLN: INTRAVENOUS | Status: DC

## 2019-12-02 MED ORDER — HEPARIN (PORCINE) IN NACL 1000-0.9 UT/500ML-% IV SOLN
INTRAVENOUS | Status: AC
  Filled 2019-12-02: qty 1000

## 2019-12-02 MED ORDER — NITROGLYCERIN IN D5W 200-5 MCG/ML-% IV SOLN
INTRAVENOUS | Status: AC
  Filled 2019-12-02: qty 250

## 2019-12-02 MED ORDER — HEPARIN SODIUM (PORCINE) 1000 UNIT/ML IJ SOLN
INTRAMUSCULAR | Status: AC
  Filled 2019-12-02: qty 1

## 2019-12-02 MED ORDER — SODIUM CHLORIDE 0.9 % IV SOLN: INTRAVENOUS | Status: AC

## 2019-12-02 MED ORDER — CLOPIDOGREL BISULFATE 75 MG PO TABS: 75.0000 mg | ORAL_TABLET | Freq: Every day | ORAL | Status: DC

## 2019-12-02 MED ORDER — LIDOCAINE HCL (PF) 1 % IJ SOLN
INTRAMUSCULAR | Status: AC
  Filled 2019-12-02: qty 30

## 2019-12-02 MED ORDER — CLOPIDOGREL BISULFATE 75 MG PO TABS
ORAL_TABLET | ORAL | Status: DC | PRN
  Administered 2019-12-02: 75 mg via ORAL

## 2019-12-02 MED ORDER — HEPARIN SODIUM (PORCINE) 1000 UNIT/ML IJ SOLN
INTRAMUSCULAR | Status: DC | PRN
  Administered 2019-12-02: 8000 [IU] via INTRAVENOUS

## 2019-12-02 MED ORDER — SODIUM CHLORIDE 0.9% FLUSH: 3.0000 mL | INTRAVENOUS | Status: DC | PRN

## 2019-12-02 MED ORDER — HYDRALAZINE HCL 20 MG/ML IJ SOLN: 5.0000 mg | INTRAMUSCULAR | Status: DC | PRN

## 2019-12-02 MED ORDER — ACETAMINOPHEN 325 MG PO TABS: 650.0000 mg | ORAL_TABLET | ORAL | Status: DC | PRN

## 2019-12-02 SURGICAL SUPPLY — 25 items
BALLN JADE .018 4.0 X 240 (BALLOONS) ×3
BALLOON JADE .018 4.0 X 240 (BALLOONS) ×2 IMPLANT
CATH OMNI FLUSH 5F 65CM (CATHETERS) ×3 IMPLANT
CATH QUICKCROSS SUPP .035X90CM (MICROCATHETER) ×3 IMPLANT
DCB RANGER 4.0X40 135 (BALLOONS) ×2 IMPLANT
DEVICE CONTINUOUS FLUSH (MISCELLANEOUS) ×3 IMPLANT
DEVICE EMBOSHIELD NAV6 4.0-7.0 (FILTER) ×3 IMPLANT
DEVICE TORQUE .025-.038 (MISCELLANEOUS) ×3 IMPLANT
DIAMONDBACK SOLID OAS 2.0MM (CATHETERS) ×3
GLIDEWIRE ADV .035X260CM (WIRE) ×3 IMPLANT
KIT ENCORE 26 ADVANTAGE (KITS) ×3 IMPLANT
KIT MICROPUNCTURE NIT STIFF (SHEATH) ×3 IMPLANT
KIT PV (KITS) ×3 IMPLANT
LUBRICANT VIPERSLIDE CORONARY (MISCELLANEOUS) ×3 IMPLANT
RANGER DCB 4.0X40 135 (BALLOONS) ×3
SHEATH FLEX ANSEL ANG 6F 45CM (SHEATH) ×3 IMPLANT
SHEATH PINNACLE 5F 10CM (SHEATH) ×3 IMPLANT
SHEATH PINNACLE R/O II 5F 6CM (SHEATH) IMPLANT
SHEATH PROBE COVER 6X72 (BAG) ×3 IMPLANT
SYR MEDRAD MARK V 150ML (SYRINGE) ×3 IMPLANT
SYSTEM DIMNDBCK SLD OAS 2.0MM (CATHETERS) ×2 IMPLANT
TRANSDUCER W/STOPCOCK (MISCELLANEOUS) ×3 IMPLANT
TRAY PV CATH (CUSTOM PROCEDURE TRAY) ×3 IMPLANT
WIRE BENTSON .035X145CM (WIRE) ×3 IMPLANT
WIRE VIPER ADVANCE .017X335CM (WIRE) ×3 IMPLANT

## 2019-12-02 NOTE — Op Note (Signed)
Patient name: Luis Lindsey MRN: [id number] DOB: [birth year] Sex: male  12/02/2019 Pre-operative Diagnosis: Short distance lifestyle limiting claudication of the left lower extremity Post-operative diagnosis:  Same Surgeon:  Marty Heck, MD Procedure Performed: 1.  Ultrasound-guided access of the right common femoral artery 2.  Aortogram including catheter selection of the aorta 3.  Bilateral lower extremity arteriogram including selection of third order branches in the left lower extremity 4.  Left SFA and above-knee popliteal artery mechanical orbital atherectomy (2.0 mm solid CSI atherectomy device) 5.  Left SFA and above-knee popliteal artery angioplasty including drug-coated balloon (4 mm jade and 4 mm drug-coated Ranger)  Indications: Patient is a 69 year old male who is well-known to vascular surgery service and previously had a left external iliac stent for left lower extremity claudication.  Patient recently was seen in follow-up and has had worsening of his left leg claudication symptoms with monophasic runoff at the ankle.  States he cannot walk more than 30-40 feet now.  He presents today for left leg arteriogram and possible intervention.  Findings:   Aortogram showed no flow-limiting stenosis in the aortoiliac segment.  The left external iliac stent is widely patent.  Left lower extremity arteriogram which is the side of interest shows patent common femoral and profunda.  There is a calcified high-grade stenosis in the proximal SFA at the ostium.  The SFA is diffusely diseased and calcified with multiple segments of high-grade calcified stenosis that appear flow-limiting.  The popliteal artery is patent with three-vessel runoff.  Flow is sluggish distally.  Right lower extremity arteriogram shows a patent common femoral, profunda as well as SFA.  SFA is also heavily calcified but no apparent flow-limiting stenosis.  Popliteal is patent with three-vessel  runoff.  Ultimately the left lower extremity SFA disease was crossed and mechanical orbital atherectomy was performed at low medium and high speeds with a 2.0 solid CSI atherectomy device with a NAV6 filter in the left popliteal artery.  This was then angioplastied with a 4 mm Jade and a 4 mm drug-coated Ranger.   Procedure:  The patient was identified in the holding area and taken to room 8.  The patient was then placed supine on the table and prepped and draped in the usual sterile fashion.  A time out was called.  Ultrasound was used to evaluate the right common femoral artery.  It was patent .  A digital ultrasound image was acquired.  A micropuncture needle was used to access the right common femoral artery under ultrasound guidance.  An 018 wire was advanced without resistance and a micropuncture sheath was placed.  The 018 wire was removed and a benson wire was placed.  The micropuncture sheath was exchanged for a 5 french sheath.  An omniflush catheter was advanced over the wire to the level of L-1.  An abdominal angiogram was obtained.  Next the Omni Flush catheter was pulled down and bilateral lower extremity runoff was obtained.  Given plan to intervene on the left leg, I then use a Glidewire advantage and ultimately selected the left profunda and then placed a 6 [place] long Ansell sheath in the right groin over the aortic bifurcation.  Patient was given 100 units per kilogram heparin.  I then selected the left SFA and got across the proximal ostial high-grade stenosis with a 035 quick cross catheter across all the SFA disease into the behind the knee popliteal artery.  Confirmed hand-injection was in the true lumen.  I  placed a 014 Viper wire down the left SFA into the left popliteal artery.  I then deployed a naV 6 filter in the behind the knee left popliteal artery.  A 2.0 mm solid CSI atherectomy device was chosen and the entire SFA from the proximal ostial lesion all the way into the above-knee  popliteal artery was treated with mechanical orbital atherectomy at low medium and high speeds.  I then post angioplastied this with a 4 mm Jade balloon throughout the entire segment.  I then retrieved our filter from behind the knee.  When I shot runoff there was no evidence residual stenosis other than in the proximal ostium where there appeared to be about 50% stenosis.  I then went ahead and treated this with a 4 mm drug-coated Ranger with prolonged inflation and this looked better although there is still some residual disease.  Given he is only claudicant I did not want to be more aggressive.  Wires and catheters were removed and the sheath was pulled over the aortic bifurcation.  Taken to holding to have his right sheath removed.  Stable at the end of the case with brisk left DP and PT signals.    Plan: Patient will continue aspirin and Plavix.  Will arrange follow-up in 1 month with left leg arterial duplex and ABIs.   Marty Heck, MD [facility] Office: [phone]

## 2019-12-02 NOTE — Discharge Instructions (Signed)
Continue aspirin and Plavix daily after left leg intervention today.  Will arrange follow-up in 1 month in our office.  Call with questions or concerns.  Femoral Site Care This sheet gives you information about how to care for yourself after your procedure. Your health care provider may also give you more specific instructions. If you have problems or questions, contact your health care provider. What can I expect after the procedure? After the procedure, it is common to have:  Bruising that usually fades within 1-2 weeks.  Tenderness at the site. Follow these instructions at home: Wound care  Follow instructions from your health care provider about how to take care of your insertion site. Make sure you: ? Wash your hands with soap and water before you change your bandage (dressing). If soap and water are not available, use hand sanitizer. ? Change your dressing as told by your health care provider. ? Leave stitches (sutures), skin glue, or adhesive strips in place. These skin closures may need to stay in place for 2 weeks or longer. If adhesive strip edges start to loosen and curl up, you may trim the loose edges. Do not remove adhesive strips completely unless your health care provider tells you to do that.  Do not take baths, swim, or use a hot tub until your health care provider approves.  You may shower 24-48 hours after the procedure or as told by your health care provider. ? Gently wash the site with plain soap and water. ? Pat the area dry with a clean towel. ? Do not rub the site. This may cause bleeding.  Do not apply powder or lotion to the site. Keep the site clean and dry.  Check your femoral site every day for signs of infection. Check for: ? Redness, swelling, or pain. ? Fluid or blood. ? Warmth. ? Pus or a bad smell. Activity  For the first 2-3 days after your procedure, or as long as directed: ? Avoid climbing stairs as much as possible. ? Do not squat.  Do not  lift anything that is heavier than 10 lb (4.5 kg), or the limit that you are told, until your health care provider says that it is safe.  Rest as directed. ? Avoid sitting for a long time without moving. Get up to take short walks every 1-2 hours.  Do not drive for 24 hours if you were given a medicine to help you relax (sedative). General instructions  Take over-the-counter and prescription medicines only as told by your health care provider.  Keep all follow-up visits as told by your health care provider. This is important. Contact a health care provider if you have:  A fever or chills.  You have redness, swelling, or pain around your insertion site. Get help right away if:  The catheter insertion area swells very fast.  You pass out.  You suddenly start to sweat or your skin gets clammy.  The catheter insertion area is bleeding, and the bleeding does not stop when you hold steady pressure on the area.  The area near or just beyond the catheter insertion site becomes pale, cool, tingly, or numb. These symptoms may represent a serious problem that is an emergency. Do not wait to see if the symptoms will go away. Get medical help right away. Call your local emergency services (911 in the [place] Do not drive yourself to the hospital. Summary  After the procedure, it is common to have bruising that usually fades within 1-2  weeks.  Check your femoral site every day for signs of infection.  Do not lift anything that is heavier than 10 lb (4.5 kg), or the limit that you are told, until your health care provider says that it is safe. This information is not intended to replace advice given to you by your health care provider. Make sure you discuss any questions you have with your health care provider. Document Revised: 05/27/2017 Document Reviewed: 05/27/2017 Elsevier Patient Education  2020 [employer].

## 2019-12-02 NOTE — H&P (Signed)
History and Physical Interval Note:  12/02/2019 8:59 AM  Luis Lindsey  has presented today for surgery, with the diagnosis of pad.  The various methods of treatment have been discussed with the patient and family. After consideration of risks, benefits and other options for treatment, the patient has consented to  Procedure(s): ABDOMINAL AORTOGRAM W/LOWER EXTREMITY (N/A) as a surgical intervention.  The patient's history has been reviewed, patient examined, no change in status, stable for surgery.  I have reviewed the patient's chart and labs.  Questions were answered to the patient's satisfaction.    Left lower extremity lifestyle limiting claudication  Marty Heck  Office Note     CC:  follow up left EIA stent now with LLE pain Requesting Provider:  Alroy Dust, [name], MD  HPI: Luis Lindsey is a 69 y.o. (01/08/1951) male who presents with LLE pain.  He describes this as left calf pain radiating proximally after walking approximately 50 feet.  This resolves with rest.  He cannot shop in the grocery store as before.  He notice this approximately 3 weeks ago.  Denies rest pain.  His left buttock pain from disk disease is at baseline.  Previously underwent left external iliac stent on 06/18/2018 for left lower extremity lifestyle limiting claudication. At his visit with Dr. Carlis Abbott on August 11, 2019, he stated he saw some initial improvement after the stent was placed but then he also underwent L4-L5 microdiscectomy with Dr. Ronnald Ramp and had even more continued improvement. He reported he got some tightness in his left calf but this was very tolerable. Arrangements were made for surveillance follow-up.  Recently underwent right carotid endarterectomy with Dr. Carlis Abbott for asymptomatic stenosis. He denies any new neurologic symptoms.  Compliant with aspirin and Plavix. Former smoker. +DM      Past Medical History:  Diagnosis Date  . Arthritis   . BCC (basal cell carcinoma)   .  BPH (benign prostatic hyperplasia)   . CAROTID BRUIT, RIGHT 11/30/2009  . DM 11/30/2009  . GERD (gastroesophageal reflux disease)   . Heart murmur   . History of hiatal hernia   . History of Holter monitoring 2009  . History of kidney stones   . HNP (herniated nucleus pulposus), lumbar    L4-5  . HYPERCHOLESTEROLEMIA 11/30/2009  . HYPERTENSION 11/30/2009  . Kidney stones   . Peripheral vascular disease ([place])   . Right carotid bruit   . SMOKER 11/30/2009  . Stroke ([place]) 2012  . URINARY CALCULUS 11/30/2009         Past Surgical History:  Procedure Laterality Date  . ABDOMINAL AORTOGRAM W/LOWER EXTREMITY N/A 06/18/2018   Procedure: ABDOMINAL AORTOGRAM W/LOWER EXTREMITY;  Surgeon: Marty Heck, MD;  Location: [facility];  Service: Cardiovascular;  Laterality: N/A;  . arthroscopy of right shoulder Right    Jan 2019, June 2019-- done at [facility], Dr. Ninfa Linden  . BACK SURGERY    . CATARACT EXTRACTION W/ INTRAOCULAR LENS  IMPLANT, BILATERAL    . CYSTOSCOPY    . ENDARTERECTOMY Right 08/24/2019   Procedure: ENDARTERECTOMY CAROTID;  Surgeon: Marty Heck, MD;  Location: [facility];  Service: Vascular;  Laterality: Right;  . FACIAL COSMETIC SURGERY  2014  . FINGER SURGERY  2002  . LUMBAR LAMINECTOMY/DECOMPRESSION MICRODISCECTOMY Left 08/01/2018   Procedure: Microdiscectomy - Lumbar four-Lumbar five - left;  Surgeon: Eustace Moore, MD;  Location: [facility];  Service: Neurosurgery;  Laterality: Left;  . LUMBAR LAMINECTOMY/DECOMPRESSION MICRODISCECTOMY Left  10/15/2018   Procedure: Re-do Microdiscectomy - left - Lumbar four-Lumbar five;  Surgeon: Eustace Moore, MD;  Location: [facility];  Service: Neurosurgery;  Laterality: Left;  . NECK SURGERY  1986  . PERIPHERAL VASCULAR INTERVENTION  06/18/2018   Procedure: PERIPHERAL VASCULAR INTERVENTION;  Surgeon: Marty Heck, MD;  Location: [facility];  Service: Cardiovascular;;  Left  external iliac  . ROTATOR CUFF REPAIR Left   . UMBILICAL HERNIA REPAIR      Social History        Socioeconomic History  . Marital status: Married    Spouse name: Luis Lindsey   . Number of children: 3  . Years of education: 4  . Highest education level: Not on file  Occupational History  . Occupation: Truck Education administrator: [employer]  Tobacco Use  . Smoking status: Former Smoker    Years: 40.00    Types: Cigarettes    Quit date: 12/2015    Years since quitting: 3.9  . Smokeless tobacco: Never Used  . Tobacco comment: Quit 12-2012  Vaping Use  . Vaping Use: Never used  Substance and Sexual Activity  . Alcohol use: No  . Drug use: No  . Sexual activity: Not on file  Other Topics Concern  . Not on file  Social History Narrative   . Patient drinks 2-3 cups of caffeine daily.    Patient lives at home with his wife Luis Lindsey.    Patient works at [employer].    Education. 12 th grade   Right handed               Social Determinants of Health      Financial Resource Strain:   . Difficulty of Paying Living Expenses:   Food Insecurity: No Food Insecurity  . Worried About Charity fundraiser in the Last Year: Never true  . Ran Out of Food in the Last Year: Never true  Transportation Needs: No Transportation Needs  . Lack of Transportation (Medical): No  . Lack of Transportation (Non-Medical): No  Physical Activity:   . Days of Exercise per Week:   . Minutes of Exercise per Session:   Stress:   . Feeling of Stress :   Social Connections:   . Frequency of Communication with Friends and Family:   . Frequency of Social Gatherings with Friends and Family:   . Attends Religious Services:   . Active Member of Clubs or Organizations:   . Attends Archivist Meetings:   [name] Marital Status:   Intimate Partner Violence:   . Fear of Current or Ex-Partner:   . Emotionally Abused:   [name] Physically Abused:   . Sexually Abused:         Family  History  Problem Relation Age of Onset  . Diabetes Sister   . Diabetes Mellitus I Sister   . Stroke Father   . Heart disease Father   . CVA Father   . Coronary artery disease Father   . Diabetes Mother   . Heart disease Mother   . Diabetes Mellitus I Mother   . Coronary artery disease Mother   . Coronary artery disease Brother   . Heart disease Brother   . Coronary artery disease Brother           Current Outpatient Medications  Medication Sig Dispense Refill  . amLODipine-benazepril (LOTREL) 10-40 MG per capsule Take 1 capsule by mouth daily.      [name] aspirin EC  81 MG EC tablet Take 1 tablet (81 mg total) by mouth daily at 6 (six) AM.    . clopidogrel (PLAVIX) 75 MG tablet Take 1 tablet (75 mg total) by mouth daily. 90 tablet 4  . gabapentin (NEURONTIN) 300 MG capsule Take 1 capsule (300 mg total) by mouth 3 (three) times daily. 270 capsule 4  . hydrochlorothiazide (HYDRODIURIL) 25 MG tablet Take 25 mg by mouth daily.      [name] HYDROcodone-acetaminophen (NORCO/VICODIN) 5-325 MG tablet Take 1 tablet by mouth every 6 (six) hours as needed for moderate pain. 15 tablet 0  . metFORMIN (GLUCOPHAGE-XR) 500 MG 24 hr tablet Take 1,000 mg by mouth 2 (two) times daily.    . metoprolol succinate (TOPROL-XL) 50 MG 24 hr tablet Take 50 mg by mouth daily. Take with or immediately following a meal.    . pantoprazole (PROTONIX) 40 MG tablet Take 40 mg by mouth daily with breakfast.     . pioglitazone (ACTOS) 45 MG tablet TAKE 1 TABLET DAILY (NEED APPOINTMENT FOR FURTHER REFILLS) (Patient taking differently: Take 45 mg by mouth daily. ) 90 tablet 0  . repaglinide (PRANDIN) 2 MG tablet Take 2 mg by mouth 3 (three) times daily before meals.     . rosuvastatin (CRESTOR) 40 MG tablet Take 40 mg by mouth daily.     Willeen Niece 100-33 UNT-MCG/ML SOPN Inject 60 Units into the skin every morning.      No current facility-administered medications for this visit.          Allergies  Allergen Reactions  . Liraglutide Nausea And Vomiting    Vomiting--patient denies this intolerance/allergy  . Codeine Nausea Only     REVIEW OF SYSTEMS:   [X]  denotes positive finding, [ ]  denotes negative finding Cardiac  Comments:  Chest pain or chest pressure:    Shortness of breath upon exertion:    Short of breath when lying flat:    Irregular heart rhythm:        Vascular    Pain in calf, thigh, or hip brought on by ambulation: x   Pain in feet at night that wakes you up from your sleep:     Blood clot in your veins:    Leg swelling:  x  left foot after walking      Pulmonary    Oxygen at home:    Productive cough:     Wheezing:         Neurologic    Sudden weakness in arms or legs:     Sudden numbness in arms or legs:     Sudden onset of difficulty speaking or slurred speech:    Temporary loss of vision in one eye:     Problems with dizziness:         Gastrointestinal    Blood in stool:     Vomited blood:         Genitourinary    Burning when urinating:     Blood in urine:        Psychiatric    Major depression:         Hematologic    Bleeding problems:    Problems with blood clotting too easily:        Skin    Rashes or ulcers:        Constitutional    Fever or chills:      PHYSICAL EXAMINATION:     Vitals:   11/25/19 1038  BP: 136/61  Pulse: (!) 55  Resp: 20  Temp: 97.8 F (36.6 C)  SpO2: 100%   General:  WDWN in NAD; vital signs documented above Gait: steady, no ataxia HENT: WNL, normocephalic Pulmonary: normal non-labored breathing , without Rales, rhonchi,  wheezing Cardiac: regular HR, without  Murmurs  Abdomen: soft, NT, no masses Skin: without rashes Vascular Exam/Pulses: Pedal and pop pulses not palpable. 2+ femoral pulses bilaterally Extremities: with chronic ischemic changes, without Gangrene , without  cellulitis; without open wounds;  Musculoskeletal: no muscle wasting or atrophy       Neurologic: A&O X 3;  No focal weakness or paresthesias are detected Psychiatric:  The pt has Normal affect.   Non-Invasive Vascular Imaging:   ABI Findings:  +---------+------------------+-----+---------+--------+  Right  Rt Pressure (mmHg)IndexWaveform Comment   +---------+------------------+-----+---------+--------+  Brachial 157                      +---------+------------------+-----+---------+--------+  ATA   116        0.71            +---------+------------------+-----+---------+--------+  PTA   125        0.77 triphasic      +---------+------------------+-----+---------+--------+  DP                triphasic      +---------+------------------+-----+---------+--------+  Esau Grew        0.51            +---------+------------------+-----+---------+--------+   +---------+------------------+-----+----------+-------+  Left   Lt Pressure (mmHg)IndexWaveform Comment  +---------+------------------+-----+----------+-------+  Brachial 163                      +---------+------------------+-----+----------+-------+  ATA   95        0.58            +---------+------------------+-----+----------+-------+  PTA   98        0.60 monophasic      +---------+------------------+-----+----------+-------+  DP                monophasic      +---------+------------------+-----+----------+-------+  Great Toe52        0.32            +---------+------------------+-----+----------+-------+   +-------+-----------+-----------+------------+------------+  ABI/TBIToday's ABIToday's TBIPrevious ABIPrevious TBI   +-------+-----------+-----------+------------+------------+  Right 0.77    0.51    0.80    0.62      +-------+-----------+-----------+------------+------------+  Left  0.60    0.32    0.61    0.41      +-------+-----------+-----------+------------+------------+   Bilateral ABIs appear essentially unchanged compared to prior study on  08/11/2019.    Summary:  Right: Resting right ankle-brachial index indicates moderate right lower  extremity arterial disease. The right toe-brachial index is abnormal. RT  great toe pressure = 83 mmHg.   Left: Resting left ankle-brachial index indicates moderate left lower  extremity arterial disease. The left toe-brachial index is abnormal. LT  Great toe pressure = 52 mmHg.   ABI's/TBI's on12/03/2019: Right:0.77/0.50 Left:0.56/0.41  ASSESSMENT/PLAN:: 69 y.o. male here for left lower extremity lifestyle limiting claudication.  ABIs unchanged, but with previous duplex showing stenosis proximal to his left stent, feel arteriogram is warranted.  I informed the patient that we would arrange this and that I will discuss the assessment and plan with Dr. Carlis Abbott.  Barbie Banner, PA-C [facility] [phone]  Clinic MD:   Oneida Alar

## 2019-12-02 NOTE — Progress Notes (Signed)
D/c instructions given to [name] (sp) via phone at pt bedside.  Pt and caregiver verbalize understanding.

## 2019-12-03 ENCOUNTER — Encounter (HOSPITAL_COMMUNITY): Payer: Self-pay | Admitting: Vascular Surgery

## 2019-12-06 ENCOUNTER — Other Ambulatory Visit: Payer: Self-pay

## 2019-12-06 ENCOUNTER — Encounter (HOSPITAL_COMMUNITY): Payer: Self-pay | Admitting: Emergency Medicine

## 2019-12-06 ENCOUNTER — Emergency Department (HOSPITAL_COMMUNITY): Payer: HMO

## 2019-12-06 ENCOUNTER — Emergency Department (HOSPITAL_COMMUNITY)
Admission: EM | Admit: 2019-12-06 | Discharge: 2019-12-07 | Disposition: A | Discharge status: Home / Self Care | Payer: HMO | Attending: Emergency Medicine | Admitting: Emergency Medicine

## 2019-12-06 DIAGNOSIS — Y939 Activity, unspecified: Secondary | ICD-10-CM | POA: Diagnosis not present

## 2019-12-06 DIAGNOSIS — I10 Essential (primary) hypertension: Secondary | ICD-10-CM | POA: Diagnosis not present

## 2019-12-06 DIAGNOSIS — Z79899 Other long term (current) drug therapy: Secondary | ICD-10-CM | POA: Insufficient documentation

## 2019-12-06 DIAGNOSIS — Z7901 Long term (current) use of anticoagulants: Secondary | ICD-10-CM | POA: Insufficient documentation

## 2019-12-06 DIAGNOSIS — I2510 Atherosclerotic heart disease of native coronary artery without angina pectoris: Secondary | ICD-10-CM | POA: Diagnosis not present

## 2019-12-06 DIAGNOSIS — Y929 Unspecified place or not applicable: Secondary | ICD-10-CM | POA: Diagnosis not present

## 2019-12-06 DIAGNOSIS — Z794 Long term (current) use of insulin: Secondary | ICD-10-CM | POA: Insufficient documentation

## 2019-12-06 DIAGNOSIS — W540XXA Bitten by dog, initial encounter: Secondary | ICD-10-CM | POA: Diagnosis not present

## 2019-12-06 DIAGNOSIS — Z7982 Long term (current) use of aspirin: Secondary | ICD-10-CM | POA: Diagnosis not present

## 2019-12-06 DIAGNOSIS — Y999 Unspecified external cause status: Secondary | ICD-10-CM | POA: Diagnosis not present

## 2019-12-06 DIAGNOSIS — T148XXA Other injury of unspecified body region, initial encounter: Secondary | ICD-10-CM

## 2019-12-06 DIAGNOSIS — S61452A Open bite of left hand, initial encounter: Secondary | ICD-10-CM | POA: Diagnosis not present

## 2019-12-06 DIAGNOSIS — E119 Type 2 diabetes mellitus without complications: Secondary | ICD-10-CM | POA: Diagnosis not present

## 2019-12-06 DIAGNOSIS — S61412A Laceration without foreign body of left hand, initial encounter: Secondary | ICD-10-CM | POA: Diagnosis not present

## 2019-12-06 NOTE — ED Triage Notes (Signed)
Pt presents to ED POV. Pt c/o L hand injury after getting bit by personal dog. Pt reports dog's rabies shot are up to date. Full ROM and sensation in hand. Unknown tentanus.

## 2019-12-07 ENCOUNTER — Ambulatory Visit (INDEPENDENT_AMBULATORY_CARE_PROVIDER_SITE_OTHER): Payer: HMO | Admitting: Neurology

## 2019-12-07 ENCOUNTER — Telehealth: Payer: Self-pay | Admitting: Neurology

## 2019-12-07 ENCOUNTER — Ambulatory Visit: Payer: HMO | Admitting: Physician Assistant

## 2019-12-07 ENCOUNTER — Encounter: Payer: Self-pay | Admitting: Neurology

## 2019-12-07 ENCOUNTER — Encounter: Payer: Self-pay | Admitting: Orthopedic Surgery

## 2019-12-07 VITALS — BP 90/60 | HR 68 | Ht 70.0 in | Wt 175.0 lb

## 2019-12-07 DIAGNOSIS — W540XXA Bitten by dog, initial encounter: Secondary | ICD-10-CM | POA: Diagnosis not present

## 2019-12-07 DIAGNOSIS — M5416 Radiculopathy, lumbar region: Secondary | ICD-10-CM | POA: Diagnosis not present

## 2019-12-07 DIAGNOSIS — S61452A Open bite of left hand, initial encounter: Secondary | ICD-10-CM

## 2019-12-07 DIAGNOSIS — I739 Peripheral vascular disease, unspecified: Secondary | ICD-10-CM | POA: Diagnosis not present

## 2019-12-07 DIAGNOSIS — G6289 Other specified polyneuropathies: Secondary | ICD-10-CM | POA: Diagnosis not present

## 2019-12-07 DIAGNOSIS — R251 Tremor, unspecified: Secondary | ICD-10-CM

## 2019-12-07 MED ORDER — MUPIROCIN 2 % EX OINT: 1.0000 "application " | TOPICAL_OINTMENT | Freq: Two times a day (BID) | CUTANEOUS | 6 refills | Status: DC

## 2019-12-07 MED ORDER — AMOXICILLIN-POT CLAVULANATE 875-125 MG PO TABS: 1.0000 | ORAL_TABLET | Freq: Two times a day (BID) | ORAL | 0 refills | Status: AC

## 2019-12-07 MED ORDER — OXYCODONE-ACETAMINOPHEN 5-325 MG PO TABS
1.0000 | ORAL_TABLET | Freq: Once | ORAL | Status: AC
  Administered 2019-12-07: 1 via ORAL
  Filled 2019-12-07: qty 1

## 2019-12-07 MED ORDER — HYDROCODONE-ACETAMINOPHEN 5-325 MG PO TABS: 1.0000 | ORAL_TABLET | Freq: Four times a day (QID) | ORAL | 0 refills | Status: DC | PRN

## 2019-12-07 MED ORDER — GABAPENTIN 300 MG PO CAPS: 300.0000 mg | ORAL_CAPSULE | Freq: Three times a day (TID) | ORAL | 4 refills | Status: DC

## 2019-12-07 MED ORDER — TETANUS-DIPHTH-ACELL PERTUSSIS 5-2.5-18.5 LF-MCG/0.5 IM SUSP
0.5000 mL | Freq: Once | INTRAMUSCULAR | Status: AC
  Administered 2019-12-07: 0.5 mL via INTRAMUSCULAR
  Filled 2019-12-07: qty 0.5

## 2019-12-07 MED ORDER — AMOXICILLIN-POT CLAVULANATE 875-125 MG PO TABS
1.0000 | ORAL_TABLET | Freq: Once | ORAL | Status: AC
  Administered 2019-12-07: 1 via ORAL
  Filled 2019-12-07: qty 1

## 2019-12-07 NOTE — Patient Instructions (Signed)
Continue gabapentin at current dosing  I will get most recent labs from primary doctor and office note  See you back in 4 months

## 2019-12-07 NOTE — ED Provider Notes (Signed)
[facility] EMERGENCY DEPARTMENT Provider Note   CSN: [id number] Arrival date & time: 12/06/19  2006     History Chief Complaint  Patient presents with  . Animal Bite    Luis Lindsey is a 69 y.o. male with a history of diabetes mellitus type 2, hypercholesteremia, tobacco use disorder, HTN, CAD on Plavix who presents to the emergency department with a chief complaint of dog bite.  The patient reports that he was bitten by his fully vaccinated dog that occurred just prior to arrival.  The patient endorses 6/10, sharp, non-radiating pain to the left hand.  Maximal pain is near the thumb.  He reports that he cover the hand with a towel prior to arrival, but no other treatment at home.  He denies numbness, weakness, left wrist pain, dizziness, or lightheadedness.  He is unsure when his tetanus was last updated.  He is on Plavix.  He is right hand dominant.   The history is provided by the patient. No language interpreter was used.       Past Medical History:  Diagnosis Date  . Arthritis   . BCC (basal cell carcinoma)   . BPH (benign prostatic hyperplasia)   . CAROTID BRUIT, RIGHT 11/30/2009  . DM 11/30/2009  . GERD (gastroesophageal reflux disease)   . Heart murmur   . History of hiatal hernia   . History of Holter monitoring 2009  . History of kidney stones   . HNP (herniated nucleus pulposus), lumbar    L4-5  . HYPERCHOLESTEROLEMIA 11/30/2009  . HYPERTENSION 11/30/2009  . Kidney stones   . Peripheral vascular disease ([place])   . Right carotid bruit   . SMOKER 11/30/2009  . Stroke ([place]) 2012  . URINARY CALCULUS 11/30/2009    Patient Active Problem List   Diagnosis Date Noted  . Carotid stenosis, asymptomatic, right 08/24/2019  . Carotid disease, bilateral ([place]) 08/11/2019  . S/P lumbar laminectomy 08/01/2018  . PVD (peripheral vascular disease) ([place]) 06/10/2018  . Left lumbar radiculopathy 04/01/2018  . Peripheral neuropathy 04/01/2018  . Left leg  claudication ([place]) 04/01/2018  . Status post arthroscopy of right shoulder 06/27/2017  . 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 Chronic right shoulder pain 04/16/2017  . Displaced fracture of proximal phalanx of right great toe with routine healing 03/13/2017  . Impingement syndrome of right shoulder 03/13/2017  . Displaced fracture of proximal phalanx of right great toe, initial encounter for closed fracture 02/20/2017  . Diabetes ([place]) 08/24/2013  . History of Holter monitoring   . Heart murmur   . TIA (transient ischemic attack) 12/26/2012  . Kidney stones   . DM 11/30/2009  . HYPERCHOLESTEROLEMIA 11/30/2009  . SMOKER 11/30/2009  . HYPERTENSION 11/30/2009  . URINARY CALCULUS 11/30/2009  . CAROTID BRUIT, RIGHT 11/30/2009    Past Surgical History:  Procedure Laterality Date  . ABDOMINAL AORTOGRAM W/LOWER EXTREMITY N/A 06/18/2018   Procedure: ABDOMINAL AORTOGRAM W/LOWER EXTREMITY;  Surgeon: Marty Heck, MD;  Location: [place] [facility];  Service: Cardiovascular;  Laterality: N/A;  . ABDOMINAL AORTOGRAM W/LOWER EXTREMITY N/A 12/02/2019   Procedure: ABDOMINAL AORTOGRAM W/LOWER EXTREMITY;  Surgeon: Marty Heck, MD;  Location: [place] [facility];  Service: Cardiovascular;  Laterality: N/A;  . arthroscopy of right shoulder Right    Jan 2019, June 2019-- done at [place], Dr. Ninfa Linden  . BACK SURGERY    . CATARACT EXTRACTION W/ INTRAOCULAR LENS  IMPLANT, BILATERAL    .  CYSTOSCOPY    . ENDARTERECTOMY Right 08/24/2019   Procedure: ENDARTERECTOMY CAROTID;  Surgeon: Marty Heck, MD;  Location: [place];  Service: Vascular;  Laterality: Right;  . FACIAL COSMETIC SURGERY  2014  . FINGER SURGERY  2002  . LUMBAR LAMINECTOMY/DECOMPRESSION MICRODISCECTOMY Left 08/01/2018   Procedure: Microdiscectomy - Lumbar four-Lumbar five - left;  Surgeon: Eustace Moore, MD;  Location: [place];  Service: Neurosurgery;  Laterality: Left;  . LUMBAR  LAMINECTOMY/DECOMPRESSION MICRODISCECTOMY Left 10/15/2018   Procedure: Re-do Microdiscectomy - left - Lumbar four-Lumbar five;  Surgeon: Eustace Moore, MD;  Location: [place];  Service: Neurosurgery;  Laterality: Left;  . NECK SURGERY  1986  . PERIPHERAL VASCULAR ATHERECTOMY  12/02/2019   Procedure: PERIPHERAL VASCULAR ATHERECTOMY;  Surgeon: Marty Heck, MD;  Location: [place] [facility];  Service: Cardiovascular;;  left SFA  . PERIPHERAL VASCULAR INTERVENTION  06/18/2018   Procedure: PERIPHERAL VASCULAR INTERVENTION;  Surgeon: Marty Heck, MD;  Location: [place] [facility];  Service: Cardiovascular;;  Left external iliac  . ROTATOR CUFF REPAIR Left   . UMBILICAL HERNIA REPAIR         Family History  Problem Relation Age of Onset  . Diabetes Sister   . Diabetes Mellitus I Sister   . Stroke Father   . Heart disease Father   . CVA Father   . Coronary artery disease Father   . Diabetes Mother   . Heart disease Mother   . Diabetes Mellitus I Mother   . Coronary artery disease Mother   . Coronary artery disease Brother   . Heart disease Brother   . Coronary artery disease Brother     Social History   Tobacco Use  . Smoking status: Former Smoker    Years: 40.00    Types: Cigarettes    Quit date: 12/2015    Years since quitting: 3.9  . Smokeless tobacco: Never Used  . Tobacco comment: Quit 12-2012  Vaping Use  . Vaping Use: Never used  Substance Use Topics  . Alcohol use: No  . Drug use: No    Home Medications Prior to Admission medications   Medication Sig Start Date End Date Taking? Authorizing Provider  amLODipine-benazepril (LOTREL) 10-40 MG per capsule Take 1 capsule by mouth daily.      [provider]  amoxicillin-clavulanate (AUGMENTIN) 875-125 MG tablet Take 1 tablet by mouth every 12 (twelve) hours for 5 days. 12/07/19 12/12/19  Domonique Brouillard A, PA-C  aspirin EC 81 MG EC tablet Take 1 tablet (81 mg total) by mouth daily at 6 (six) AM. 08/26/19    Dagoberto Ligas, PA-C  clopidogrel (PLAVIX) 75 MG tablet Take 1 tablet (75 mg total) by mouth daily. 11/24/18   Marcial Pacas, MD  gabapentin (NEURONTIN) 300 MG capsule Take 1 capsule (300 mg total) by mouth 3 (three) times daily. 11/24/18   Marcial Pacas, MD  hydrochlorothiazide (HYDRODIURIL) 25 MG tablet Take 25 mg by mouth daily.      [provider]  HYDROcodone-acetaminophen (NORCO/VICODIN) 5-325 MG tablet Take 1 tablet by mouth every 6 (six) hours as needed for moderate pain. 12/07/19   Advay Volante A, PA-C  insulin degludec (TRESIBA FLEXTOUCH) 100 UNIT/ML FlexTouch Pen Inject 60 Units into the skin daily.    [provider]  metFORMIN (GLUCOPHAGE-XR) 500 MG 24 hr tablet Take 1,000 mg by mouth 2 (two) times daily. 08/20/18   [provider]  metoprolol succinate (TOPROL-XL) 50 MG 24 hr tablet Take  50 mg by mouth daily. Take with or immediately following a meal.    [provider]  [facility] VERIO test strip 1 each 3 (three) times daily. 10/29/19   [provider]  pantoprazole (PROTONIX) 40 MG tablet Take 40 mg by mouth daily with breakfast.  07/02/18   [provider]  pioglitazone (ACTOS) 45 MG tablet TAKE 1 TABLET DAILY (NEED APPOINTMENT FOR FURTHER REFILLS) Patient taking differently: Take 45 mg by mouth daily.  04/28/14   Renato Shin, MD  repaglinide (PRANDIN) 2 MG tablet Take 2 mg by mouth 3 (three) times daily before meals.  09/17/18   [provider]  rosuvastatin (CRESTOR) 40 MG tablet Take 40 mg by mouth daily.     [provider]    Allergies    Codeine  Review of Systems   Review of Systems  Constitutional: Negative for activity change, chills and fever.  Respiratory: Negative for shortness of breath.   Cardiovascular: Negative for chest pain.  Gastrointestinal: Negative for abdominal pain.  Musculoskeletal: Positive for arthralgias and myalgias. Negative for back pain and joint swelling.  Skin: Positive for  wound. Negative for rash.  Neurological: Negative for weakness and numbness.    Physical Exam Updated Vital Signs BP (!) 141/70 (BP Location: Left Arm)   Pulse (!) 58   Temp 98.4 F (36.9 C) (Oral)   Resp 18   Ht 5\' 10"  (1.778 m)   Wt 79.4 kg   SpO2 100%   BMI 25.11 kg/m   Physical Exam Vitals and nursing note reviewed.  Constitutional:      Appearance: He is well-developed. He is not ill-appearing or toxic-appearing.  HENT:     Head: Normocephalic.  Cardiovascular:     Rate and Rhythm: Normal rate and regular rhythm.     Heart sounds: No murmur heard.   Pulmonary:     Effort: Pulmonary effort is normal.  Musculoskeletal:     Cervical back: Neck supple.     Comments: Multiple skin tears noted to the dorsum of the left hand.  No wounds noted to the palm.  Wounds are not amenable to laceration repair.  He is tender to palpation to the dorsum of the hand on the radial aspect.  No tenderness over the first MCP.  Right thumb is nontender.  He has full active and passive range of motion of all digits of the right hand.  Normal exam of the wrist.  Radial pulses are 2+ and symmetric.  Digits are well-perfused with good capillary refill.  Sensation is intact and equal throughout.  Skin:    General: Skin is warm and dry.  Neurological:     Mental Status: He is alert.  Psychiatric:        Behavior: Behavior normal.         ED Results / Procedures / Treatments   Labs (all labs ordered are listed, but only abnormal results are displayed) Labs Reviewed - No data to display  EKG None  Radiology DG Hand 2 View Left  Result Date: 12/06/2019 CLINICAL DATA:  69 year old male status post dog bite. Unable to remove ring from ring finger. EXAM: LEFT HAND - 2 VIEW COMPARISON:  None. FINDINGS: Soft tissue irregularity and soft tissue gas throughout the dorsum of the left hand at the carpal and metacarpal level. No radiopaque foreign body identified. No underlying fracture or  dislocation identified. Ring artifact at the left 4th proximal phalanx. No acute osseous abnormality identified. IMPRESSION: Soft tissue injury  with subcutaneous gas. No osseous injury identified. Electronically Signed   By: Genevie Ann M.D.   On: 12/06/2019 20:49    Procedures Procedures (including critical care time)  Medications Ordered in ED Medications  oxyCODONE-acetaminophen (PERCOCET/ROXICET) 5-325 MG per tablet 1 tablet (1 tablet Oral Given 12/07/19 0144)  Tdap (BOOSTRIX) injection 0.5 mL (0.5 mLs Intramuscular Given 12/07/19 0149)  amoxicillin-clavulanate (AUGMENTIN) 875-125 MG per tablet 1 tablet (1 tablet Oral Given 12/07/19 0148)    ED Course  I have reviewed the triage vital signs and the nursing notes.  Pertinent labs & imaging results that were available during my care of the patient were reviewed by me and considered in my medical decision making (see chart for details).    MDM Rules/Calculators/A&P                          69 year old male with a history of diabetes mellitus type 2, hypercholesteremia, tobacco use disorder, HTN, CAD on Plavix presenting with a dog bite to the left hand.  The dog belonged to the patient and is fully immunized.  He is hemodynamically stable.  X-ray of the left hand is negative for fracture or retained foreign body.  On exam, his wounds are not amenable to closure.  Will update Tdap today.  Pain controlled in the ER.  He is neurovascularly intact on exam.  Wound care provided in the ER and Xeroform gauze placed over the skin tears followed by a pressure dressing.  We will start the patient on Augmentin and first dose was given in the ER.  The patient is already established with a hand surgeon and can follow-up as needed.  He has been advised that if he develops signs or symptoms of infection that he should return to the ER.  All questions answered.  The patient was discussed and independently evaluated by Dr. Christy Gentles, attending physician.  He is  hemodynamically stable and in no acute distress.  Safe for discharge home with outpatient follow-up as indicated.  Final Clinical Impression(s) / ED Diagnoses Final diagnoses:  Dog bite, initial encounter  Multiple skin tears    Rx / DC Orders ED Discharge Orders         Ordered    HYDROcodone-acetaminophen (NORCO/VICODIN) 5-325 MG tablet  Every 6 hours PRN     Discontinue  Reprint     12/07/19 0155    amoxicillin-clavulanate (AUGMENTIN) 875-125 MG tablet  Every 12 hours     Discontinue  Reprint     12/07/19 0155           Daralyn Bert, Maree Erie A, PA-C 12/07/19 0901    Ripley Fraise, MD 12/08/19 [id number]

## 2019-12-07 NOTE — Progress Notes (Signed)
PATIENT: Luis Lindsey DOB: 1950-06-11  REASON FOR VISIT: follow up HISTORY FROM: patient  HISTORY OF PRESENT ILLNESS: Today 12/07/19  HISTORY Luis Lindsey is a 69 years old male, seen in request by his primary care physician Dr. Donnie Coffin for evaluation of peripheral neuropathy, initial evaluation was on April 01, 2018.  I have reviewed and summarized the referring note from the referring physician.  He has past medical history of hypertension, diabetes 20 years, hyperlipidemia, previously heavy smoker of 1 pack a day for 40 years, stopped in 2017  Since 2017, he noticed numbness tingling burning sensation at the bottom of his feet, gradually work its way up now to bilateral ankle level, since 2019, he also noticed mild bilateral fingertips numbness tingling, his fingertip tends to turn to white, sharp burning pain when he grab grocery from freezer.  Since 2016, he also noticed left lower extremity radiating pain, initially it does not put significant limitation to his daily function, now has worsening radiating pain, especially when walking, now he cannot finish shopping at a grocery store, developed left calf deep achy pain after walking short distance.  He denies similar involvement of right lower extremity, denies bowel and bladder incontinence.  Electrodiagnostic study on February 21, 2018 showed evidence of bilateral axonal sensorimotor polyneuropathy, there is no evidence of active lumbosacral radiculopathy.  He also reported, if he does not have enough water intake, he noticed more left lower extremity pain, left foot swelling will decrease with increasing water intake  UPDATE Apr 29 2018: He continues to have significant left low back pain, radiating pain to left leg, we personally reviewed MRI of lumbar spine November 2019, L5-S1 large left paramedian disc herniation compressing the left S1 nerve roots within the left lateral recess,  X-ray of left hip in  September 2019: Mild left hip joint degenerative changes.  UPDATE November 15 2018: I reviewed Dr. Carlis Abbott vein specialist note on Oct 21, 2018, he had left external iliac stent on June 18, 2018, for left lower extremity lifestyle limiting claudications, also had left L4-5 hemi-laminectomy medial facetectomy foraminotomy followed by redo microdiscectomy L4-5 on the left side by Dr. Sherley Bounds on Oct 15, 2018.  He can walk much better, only has mild left low back pain.  He is on Asa and plavix, I have advise him to be on single antiplatelet agent Plavix alone   Update December 07, 2019 SS: On 12/02/19 had Abdominal Aortogram with lower extremity intervention on the left for blockages, since procedure he is on dual therapy with aspirin and Plavix.  Suffered a dog bite to the left hand yesterday.  Remains on gabapentin 300 mg 3 times daily, helps him sleep. Has numbness to left leg, improved after recent procedure.  Is receiving injections in his back from Dr. Ronnald Ramp, is helping the pain in his legs.  Recent A1c was 9.2. Mentions tremor to right hand for years, has worsened overtime, now has some in the left hand, right leg.  Has difficulty with handwriting and eating at times.  Discussed this with PCP, indicates heart test was done and blood work?  Denies family history of essential tremor. Never mentioned to Dr. Krista Blue because was only in the right hand.  REVIEW OF SYSTEMS: Out of a complete 14 system review of symptoms, the patient complains only of the following symptoms, and all other reviewed systems are negative.  Leg pain, numbness  ALLERGIES: Allergies  Allergen Reactions   Codeine Nausea Only  HOME MEDICATIONS: Outpatient Medications Prior to Visit  Medication Sig Dispense Refill   amLODipine-benazepril (LOTREL) 10-40 MG per capsule Take 1 capsule by mouth daily.       amoxicillin-clavulanate (AUGMENTIN) 875-125 MG tablet Take 1 tablet by mouth every 12 (twelve) hours for 5 days. 10  tablet 0   aspirin EC 81 MG EC tablet Take 1 tablet (81 mg total) by mouth daily at 6 (six) AM.     clopidogrel (PLAVIX) 75 MG tablet Take 1 tablet (75 mg total) by mouth daily. 90 tablet 4   hydrochlorothiazide (HYDRODIURIL) 25 MG tablet Take 25 mg by mouth daily.       HYDROcodone-acetaminophen (NORCO/VICODIN) 5-325 MG tablet Take 1 tablet by mouth every 6 (six) hours as needed for moderate pain. 6 tablet 0   insulin degludec (TRESIBA FLEXTOUCH) 100 UNIT/ML FlexTouch Pen Inject 60 Units into the skin daily.     metFORMIN (GLUCOPHAGE-XR) 500 MG 24 hr tablet Take 1,000 mg by mouth 2 (two) times daily.     metoprolol succinate (TOPROL-XL) 50 MG 24 hr tablet Take 50 mg by mouth daily. Take with or immediately following a meal.     mupirocin ointment (BACTROBAN) 2 % Apply 1 application topically 2 (two) times daily. 22 g 6   ONETOUCH VERIO test strip 1 each 3 (three) times daily.     pantoprazole (PROTONIX) 40 MG tablet Take 40 mg by mouth daily with breakfast.      pioglitazone (ACTOS) 45 MG tablet TAKE 1 TABLET DAILY (NEED APPOINTMENT FOR FURTHER REFILLS) (Patient taking differently: Take 45 mg by mouth daily. ) 90 tablet 0   repaglinide (PRANDIN) 2 MG tablet Take 2 mg by mouth 3 (three) times daily before meals.      rosuvastatin (CRESTOR) 40 MG tablet Take 40 mg by mouth daily.      gabapentin (NEURONTIN) 300 MG capsule Take 1 capsule (300 mg total) by mouth 3 (three) times daily. 270 capsule 4   No facility-administered medications prior to visit.    PAST MEDICAL HISTORY: Past Medical History:  Diagnosis Date   Arthritis    BCC (basal cell carcinoma)    BPH (benign prostatic hyperplasia)    CAROTID BRUIT, RIGHT 11/30/2009   DM 11/30/2009   GERD (gastroesophageal reflux disease)    Heart murmur    History of hiatal hernia    History of Holter monitoring 2009   History of kidney stones    HNP (herniated nucleus pulposus), lumbar    L4-5   HYPERCHOLESTEROLEMIA  11/30/2009   HYPERTENSION 11/30/2009   Kidney stones    Peripheral vascular disease ([place])    Right carotid bruit    SMOKER 11/30/2009   Stroke ([place]) 2012   URINARY CALCULUS 11/30/2009    PAST SURGICAL HISTORY: Past Surgical History:  Procedure Laterality Date   ABDOMINAL AORTOGRAM W/LOWER EXTREMITY N/A 06/18/2018   Procedure: ABDOMINAL AORTOGRAM W/LOWER EXTREMITY;  Surgeon: Marty Heck, MD;  Location: [facility];  Service: Cardiovascular;  Laterality: N/A;   ABDOMINAL AORTOGRAM W/LOWER EXTREMITY N/A 12/02/2019   Procedure: ABDOMINAL AORTOGRAM W/LOWER EXTREMITY;  Surgeon: Marty Heck, MD;  Location: [facility];  Service: Cardiovascular;  Laterality: N/A;   arthroscopy of right shoulder Right    Jan 2019, June 2019-- done at [facility], Dr. Ninfa Linden   BACK SURGERY     CATARACT EXTRACTION W/ INTRAOCULAR LENS  IMPLANT, BILATERAL     CYSTOSCOPY     ENDARTERECTOMY Right 08/24/2019  Procedure: ENDARTERECTOMY CAROTID;  Surgeon: Marty Heck, MD;  Location: [facility];  Service: Vascular;  Laterality: Right;   FACIAL COSMETIC SURGERY  2014   FINGER SURGERY  2002   LUMBAR LAMINECTOMY/DECOMPRESSION MICRODISCECTOMY Left 08/01/2018   Procedure: Microdiscectomy - Lumbar four-Lumbar five - left;  Surgeon: Eustace Moore, MD;  Location: [facility];  Service: Neurosurgery;  Laterality: Left;   LUMBAR LAMINECTOMY/DECOMPRESSION MICRODISCECTOMY Left 10/15/2018   Procedure: Re-do Microdiscectomy - left - Lumbar four-Lumbar five;  Surgeon: Eustace Moore, MD;  Location: [facility];  Service: Neurosurgery;  Laterality: Left;   NECK SURGERY  1986   PERIPHERAL VASCULAR ATHERECTOMY  12/02/2019   Procedure: PERIPHERAL VASCULAR ATHERECTOMY;  Surgeon: Marty Heck, MD;  Location: [facility];  Service: Cardiovascular;;  left SFA   PERIPHERAL VASCULAR INTERVENTION  06/18/2018   Procedure: PERIPHERAL VASCULAR INTERVENTION;  Surgeon: Marty Heck, MD;  Location: [facility];  Service: Cardiovascular;;  Left external iliac   ROTATOR CUFF REPAIR Left    UMBILICAL HERNIA REPAIR      FAMILY HISTORY: Family History  Problem Relation Age of Onset   Diabetes Sister    Diabetes Mellitus I Sister    Stroke Father    Heart disease Father    CVA Father    Coronary artery disease Father    Diabetes Mother    Heart disease Mother    Diabetes Mellitus I Mother    Coronary artery disease Mother    Coronary artery disease Brother    Heart disease Brother    Coronary artery disease Brother     SOCIAL HISTORY: Social History   Socioeconomic History   Marital status: Married    Spouse name: [name]    Number of children: 3   Years of education: 11   Highest education level: Not on file  Occupational History   Occupation: Truck Education administrator: [employer]  Tobacco Use   Smoking status: Former Smoker    Years: 40.00    Types: Cigarettes    Quit date: 12/2015    Years since quitting: 3.9   Smokeless tobacco: Never Used   Tobacco comment: Quit 12-2012  Vaping Use   Vaping Use: Never used  Substance and Sexual Activity   Alcohol use: No   Drug use: No   Sexual activity: Not on file  Other Topics Concern   Not on file  Social History Narrative   . Patient drinks 2-3 cups of caffeine daily.    Patient lives at home with his wife [name].    Patient works at [employer].    Education. 12 th grade   Right handed               Social Determinants of Health   Financial Resource Strain:    Difficulty of Paying Living Expenses:   Food Insecurity: No Food Insecurity   Worried About Charity fundraiser in the Last Year: Never true   Ran Out of Food in the Last Year: Never true  Transportation Needs: No Transportation Needs   Lack of Transportation (Medical): No   Lack of Transportation (Non-Medical): No  Physical Activity:    Days of Exercise per Week:    Minutes of Exercise  per Session:   Stress:    Feeling of Stress :   Social Connections:    Frequency of Communication with Friends and Family:    Frequency of Social Gatherings with Friends and Family:  Attends Religious Services:    Active Member of Clubs or Organizations:    Attends Archivist Meetings:    Marital Status:   Intimate Partner Violence:    Fear of Current or Ex-Partner:    Emotionally Abused:    Physically Abused:    Sexually Abused:    PHYSICAL EXAM  Vitals:   12/07/19 1453 12/07/19 1523  BP: (!) 94/54 90/60  Pulse: 68   Weight: 175 lb (79.4 kg)   Height: 5\' 10"  (1.778 m)    Body mass index is 25.11 kg/m.  Generalized: Well developed, in no acute distress   Neurological examination  Mentation: Alert oriented to time, place, history taking. Follows all commands speech and language fluent Cranial nerve II-XII: Pupils were equal round reactive to light. Extraocular movements were full, visual field were full on confrontational test. Facial sensation and strength were normal.  Head turning and shoulder shrug were normal and symmetric. Motor: No significant muscle weakness noted, does have left hand wrapped following dog bite, mild intention tremor to right hand more than left, tremor was translated to handwriting sample and spiral drawing Sensory: Decreased sensation to soft touch to left lower extremity Coordination: Cerebellar testing reveals good finger-nose-finger and heel-to-shin bilaterally Gait and station: Gait is slightly wide-based, cautious. Tandem gait is unsteady. Romberg is negative. No drift is seen.  Reflexes: Deep tendon reflexes are symmetric and normal bilaterally.   DIAGNOSTIC DATA (LABS, IMAGING, TESTING) - I reviewed patient records, labs, notes, testing and imaging myself where available.  Lab Results  Component Value Date   WBC 13.0 (H) 08/25/2019   HGB 12.6 (L) 12/02/2019   HCT 37.0 (L) 12/02/2019   MCV 90.2 08/25/2019   PLT 280  08/25/2019      Component Value Date/Time   NA 140 12/02/2019 0737   K 3.2 (L) 12/02/2019 0737   CL 99 12/02/2019 0737   CO2 20 (L) 08/25/2019 0328   GLUCOSE 230 (H) 12/02/2019 0737   BUN 28 (H) 12/02/2019 0737   CREATININE 1.10 12/02/2019 0737   CALCIUM 8.6 (L) 08/25/2019 0328   PROT 7.9 08/18/2019 1001   ALBUMIN 3.9 08/18/2019 1001   AST 25 08/18/2019 1001   ALT 28 08/18/2019 1001   ALKPHOS 86 08/18/2019 1001   BILITOT 0.9 08/18/2019 1001   GFRNONAA >60 08/25/2019 0328   GFRAA >60 08/25/2019 0328   No results found for: CHOL, HDL, LDLCALC, LDLDIRECT, TRIG, CHOLHDL Lab Results  Component Value Date   HGBA1C 9.2 (H) 08/18/2019   No results found for: VITAMINB12 No results found for: TSH    ASSESSMENT AND PLAN 69 y.o. year old male  has a past medical history of Arthritis, BCC (basal cell carcinoma), BPH (benign prostatic hyperplasia), CAROTID BRUIT, RIGHT (11/30/2009), DM (11/30/2009), GERD (gastroesophageal reflux disease), Heart murmur, History of hiatal hernia, History of Holter monitoring (2009), History of kidney stones, HNP (herniated nucleus pulposus), lumbar, HYPERCHOLESTEROLEMIA (11/30/2009), HYPERTENSION (11/30/2009), Kidney stones, Peripheral vascular disease ([place]), Right carotid bruit, SMOKER (11/30/2009), Stroke ([place]) (2012), and URINARY CALCULUS (11/30/2009). here with:  1.  Peripheral neuropathy -Most likely related to diabetes -Continue gabapentin 300 mg 3 times daily  2.  Lower lumbar radiculopathy -Status post redo of left L4-5 hemilaminectomy -Receiving back injections from Dr. Ronnald Ramp, is helping with pain, getting his 3rd one next week  3.  Peripheral vascular disease, left external iliac artery 60 to 70% stenosis, status post stent in May 2020 -Recent procedure with Dr. Carlis Abbott Abdominal Aortogram with left lower  extremity intervention, back on dual treatment with Plavix 75 mg daily, aspirin 81 mg daily  4. Tremor  -Mild intention tremor on the right seen on exam  to hand, is translated to handwriting sample, seems could be essential tremor? -Recently saw PCP for this, will request most recent office note, indicates heart test was done? Blood work, TSH? No family history -Is on gabapentin, metoprolol which may benefit tremor -Will get him back in 4 months to check in with Dr. Krista Blue, discuss tremor, also she wanted him on single agent Plavix previously  I spent 30 minutes of face-to-face and non-face-to-face time with patient.  This included previsit chart review, lab review, study review, order entry, electronic health record documentation, patient education.  Butler Denmark, AGNP-C, DNP 12/07/2019, 4:37 PM [facility] [address] [place], [place] [address] [phone]

## 2019-12-07 NOTE — Telephone Encounter (Signed)
Please try to get most recent office note and lab work from PCP, patient signed release form.

## 2019-12-07 NOTE — Discharge Instructions (Signed)
Thank you for allowing me to care for you today in the Emergency Department.   You were seen in the emergency department today for dog bite.  Your x-ray did not show any broken bones.  Your tetanus immunization was updated today.  You were given your first dose of Augmentin, and antibiotic.  Keep the wound clean and dry for the next 24 hours.  Then, you can gently clean the wounds by running warm water and soap over the areas.  Gently pat the area dry then apply some yellow Xeroform antibiotic dressing over the skin tears.  Then, wrapped some cotton gauze over the hand and apply a brown Coban dressing with slight pressure.  Make sure not to make the Coban too tight.   Starting tomorrow morning, take 1 tablet of Augmentin by mouth 2 times daily for the next 5 days to prevent infection.  You can take 650 mg of Tylenol once every 6 hours for pain.  For severe, uncontrollable pain, you can take 1 tablet of Norco.  It is a narcotic.  You should not work or drive while taking this medication.  You can also apply an ice pack for 15 to 20 minutes up to 3-4 times a day for the next 5 days.for pain and swelling.  You can follow-up with your hand surgeon for a recheck in 3 to 4 days if symptoms have not significantly improved.  You need to return to the emergency department if you develop fever, chills, significant swelling, redness, or drainage from the wounds that is thick and mucus-like, or if you develop red streaking up the arms, if your fingers turn blue, or if you develop new numbness, weakness, or other new, concerning symptoms.

## 2019-12-07 NOTE — Progress Notes (Signed)
Office Visit Note   Patient: Luis Lindsey           Date of Birth: 1950-09-20           MRN: [id number] Visit Date: 12/07/2019              Requested by: Alroy Dust, [name], [place] [address] [place],  [place] [address] PCP: Alroy Dust, [name], MD  Chief Complaint  Patient presents with   Left Hand - Pain      HPI: This is a pleasant gentleman who is 24 hours status post getting bitten the hand by his dog.  He was seen in the emergency room where x-rays were taken and did not demonstrate a fracture.  He was appropriately placed on Augmentin he is here for follow-up today he is a diabetic.  He said the pain has been fairly manageable Assessment & Plan: Visit Diagnoses: No diagnosis found.  Plan: Patient will continue on Augmentin and wash his hand daily.  Apply Bactroban and a clean dressing.  Also would like for him to elevate his hand and remove his rings follow-up in 1 week  Follow-Up Instructions: No follow-ups on file.   Ortho Exam  Patient is alert, oriented, no adenopathy, well-dressed, normal affect, normal respiratory effort. Focused exam of his hand on the left demonstrates dog bite wounds on the thenar and hypothenar areas.  Do not probe deeply.  Distal CMS is intact he is able to extend and flex his hand x-rays do not show any deep wound  Imaging: DG Hand 2 View Left  Result Date: 12/06/2019 CLINICAL DATA:  69 year old male status post dog bite. Unable to remove ring from ring finger. EXAM: LEFT HAND - 2 VIEW COMPARISON:  None. FINDINGS: Soft tissue irregularity and soft tissue gas throughout the dorsum of the left hand at the carpal and metacarpal level. No radiopaque foreign body identified. No underlying fracture or dislocation identified. Ring artifact at the left 4th proximal phalanx. No acute osseous abnormality identified. IMPRESSION: Soft tissue injury with subcutaneous gas. No osseous injury identified. Electronically Signed   By: Genevie Ann M.D.   On:  12/06/2019 20:49   No images are attached to the encounter.  Labs: Lab Results  Component Value Date   HGBA1C 9.2 (H) 08/18/2019   HGBA1C 8.9 (H) 07/25/2018   HGBA1C 10.2 (H) 05/14/2018     Lab Results  Component Value Date   ALBUMIN 3.9 08/18/2019    No results found for: MG No results found for: VD25OH  No results found for: PREALBUMIN CBC EXTENDED Latest Ref Rng & Units 12/02/2019 08/25/2019 08/18/2019  WBC 4.0 - 10.5 K/uL - 13.0(H) 8.6  RBC 4.22 - 5.81 MIL/uL - 3.86(L) 4.47  HGB 13.0 - 17.0 g/dL 12.6(L) 11.2(L) 12.9(L)  HCT 39 - 52 % 37.0(L) 34.8(L) 40.3  PLT 150 - 400 K/uL - 280 333  NEUTROABS 1.7 - 7.7 K/uL - - -  LYMPHSABS 0.7 - 4.0 K/uL - - -     There is no height or weight on file to calculate BMI.  Orders:  No orders of the defined types were placed in this encounter.  Meds ordered this encounter  Medications   mupirocin ointment (BACTROBAN) 2 %    Sig: Apply 1 application topically 2 (two) times daily.    Dispense:  22 g    Refill:  6     Procedures: No procedures performed  Clinical Data: No additional findings.  ROS:  All  other systems negative, except as noted in the HPI. Review of Systems  Objective: Vital Signs: There were no vitals taken for this visit.  Specialty Comments:  No specialty comments available.  PMFS History: Patient Active Problem List   Diagnosis Date Noted   Carotid stenosis, asymptomatic, right 08/24/2019   Carotid disease, bilateral ([place]) 08/11/2019   S/P lumbar laminectomy 08/01/2018   PVD (peripheral vascular disease) ([place]) 06/10/2018   Left lumbar radiculopathy 04/01/2018   Peripheral neuropathy 04/01/2018   Left leg claudication ([place]) 04/01/2018   Status post arthroscopy of right shoulder 06/27/2017   Superior glenoid labrum lesion of right shoulder 06/10/2017   Chronic right shoulder pain 04/16/2017   Displaced fracture of proximal phalanx of right great toe with routine healing 03/13/2017    Impingement syndrome of right shoulder 03/13/2017   Displaced fracture of proximal phalanx of right great toe, initial encounter for closed fracture 02/20/2017   Diabetes ([place]) 08/24/2013   History of Holter monitoring    Heart murmur    TIA (transient ischemic attack) 12/26/2012   Kidney stones    DM 11/30/2009   HYPERCHOLESTEROLEMIA 11/30/2009   SMOKER 11/30/2009   HYPERTENSION 11/30/2009   URINARY CALCULUS 11/30/2009   CAROTID BRUIT, RIGHT 11/30/2009   Past Medical History:  Diagnosis Date   Arthritis    BCC (basal cell carcinoma)    BPH (benign prostatic hyperplasia)    CAROTID BRUIT, RIGHT 11/30/2009   DM 11/30/2009   GERD (gastroesophageal reflux disease)    Heart murmur    History of hiatal hernia    History of Holter monitoring 2009   History of kidney stones    HNP (herniated nucleus pulposus), lumbar    L4-5   HYPERCHOLESTEROLEMIA 11/30/2009   HYPERTENSION 11/30/2009   Kidney stones    Peripheral vascular disease ([place])    Right carotid bruit    SMOKER 11/30/2009   Stroke ([place]) 2012   URINARY CALCULUS 11/30/2009    Family History  Problem Relation Age of Onset   Diabetes Sister    Diabetes Mellitus I Sister    Stroke Father    Heart disease Father    CVA Father    Coronary artery disease Father    Diabetes Mother    Heart disease Mother    Diabetes Mellitus I Mother    Coronary artery disease Mother    Coronary artery disease Brother    Heart disease Brother    Coronary artery disease Brother     Past Surgical History:  Procedure Laterality Date   ABDOMINAL AORTOGRAM W/LOWER EXTREMITY N/A 06/18/2018   Procedure: ABDOMINAL AORTOGRAM W/LOWER EXTREMITY;  Surgeon: Marty Heck, MD;  Location: [facility];  Service: Cardiovascular;  Laterality: N/A;   ABDOMINAL AORTOGRAM W/LOWER EXTREMITY N/A 12/02/2019   Procedure: ABDOMINAL AORTOGRAM W/LOWER EXTREMITY;  Surgeon: Marty Heck, MD;  Location: [facility];  Service: Cardiovascular;  Laterality: N/A;   arthroscopy of right shoulder Right    Jan 2019, June 2019-- done at [facility], Dr. Ninfa Linden   BACK SURGERY     CATARACT EXTRACTION W/ INTRAOCULAR LENS  IMPLANT, BILATERAL     CYSTOSCOPY     ENDARTERECTOMY Right 08/24/2019   Procedure: ENDARTERECTOMY CAROTID;  Surgeon: Marty Heck, MD;  Location: [facility];  Service: Vascular;  Laterality: Right;   FACIAL COSMETIC SURGERY  2014   FINGER SURGERY  2002   LUMBAR LAMINECTOMY/DECOMPRESSION MICRODISCECTOMY Left 08/01/2018   Procedure: Microdiscectomy - Lumbar four-Lumbar five -  left;  Surgeon: Eustace Moore, MD;  Location: [facility];  Service: Neurosurgery;  Laterality: Left;   LUMBAR LAMINECTOMY/DECOMPRESSION MICRODISCECTOMY Left 10/15/2018   Procedure: Re-do Microdiscectomy - left - Lumbar four-Lumbar five;  Surgeon: Eustace Moore, MD;  Location: [facility];  Service: Neurosurgery;  Laterality: Left;   NECK SURGERY  1986   PERIPHERAL VASCULAR ATHERECTOMY  12/02/2019   Procedure: PERIPHERAL VASCULAR ATHERECTOMY;  Surgeon: Marty Heck, MD;  Location: [facility];  Service: Cardiovascular;;  left SFA   PERIPHERAL VASCULAR INTERVENTION  06/18/2018   Procedure: PERIPHERAL VASCULAR INTERVENTION;  Surgeon: Marty Heck, MD;  Location: [facility];  Service: Cardiovascular;;  Left external iliac   ROTATOR CUFF REPAIR Left    UMBILICAL HERNIA REPAIR     Social History   Occupational History   Occupation: Truck Education administrator: [employer]  Tobacco Use   Smoking status: Former Smoker    Years: 40.00    Types: Cigarettes    Quit date: 12/2015    Years since quitting: 3.9   Smokeless tobacco: Never Used   Tobacco comment: Quit 12-2012  Vaping Use   Vaping Use: Never used  Substance and Sexual Activity   Alcohol use: No   Drug use: No   Sexual activity: Not on file

## 2019-12-07 NOTE — ED Notes (Signed)
All discharge instructions reviewed with pt and spouse. Denies questions. Pt discharged without issue.

## 2019-12-08 NOTE — Telephone Encounter (Signed)
I requested last note and labs to be faxed.

## 2019-12-09 ENCOUNTER — Emergency Department (HOSPITAL_COMMUNITY)
Admission: EM | Admit: 2019-12-09 | Discharge: 2019-12-09 | Disposition: A | Discharge status: Home / Self Care | Payer: HMO | Attending: Emergency Medicine | Admitting: Emergency Medicine

## 2019-12-09 ENCOUNTER — Encounter (HOSPITAL_COMMUNITY): Payer: Self-pay

## 2019-12-09 ENCOUNTER — Other Ambulatory Visit: Payer: Self-pay

## 2019-12-09 DIAGNOSIS — Z87891 Personal history of nicotine dependence: Secondary | ICD-10-CM | POA: Insufficient documentation

## 2019-12-09 DIAGNOSIS — R55 Syncope and collapse: Secondary | ICD-10-CM | POA: Insufficient documentation

## 2019-12-09 DIAGNOSIS — Z794 Long term (current) use of insulin: Secondary | ICD-10-CM | POA: Insufficient documentation

## 2019-12-09 DIAGNOSIS — Z7982 Long term (current) use of aspirin: Secondary | ICD-10-CM | POA: Insufficient documentation

## 2019-12-09 DIAGNOSIS — R0902 Hypoxemia: Secondary | ICD-10-CM | POA: Diagnosis not present

## 2019-12-09 DIAGNOSIS — Z79899 Other long term (current) drug therapy: Secondary | ICD-10-CM | POA: Diagnosis not present

## 2019-12-09 DIAGNOSIS — Z8673 Personal history of transient ischemic attack (TIA), and cerebral infarction without residual deficits: Secondary | ICD-10-CM | POA: Diagnosis not present

## 2019-12-09 DIAGNOSIS — I959 Hypotension, unspecified: Secondary | ICD-10-CM | POA: Diagnosis not present

## 2019-12-09 DIAGNOSIS — R609 Edema, unspecified: Secondary | ICD-10-CM | POA: Diagnosis not present

## 2019-12-09 DIAGNOSIS — R531 Weakness: Secondary | ICD-10-CM | POA: Diagnosis not present

## 2019-12-09 DIAGNOSIS — E1151 Type 2 diabetes mellitus with diabetic peripheral angiopathy without gangrene: Secondary | ICD-10-CM | POA: Insufficient documentation

## 2019-12-09 LAB — CBC WITH DIFFERENTIAL/PLATELET
Abs Immature Granulocytes: 0.06 10*3/uL (ref 0.00–0.07)
Basophils Absolute: 0 10*3/uL (ref 0.0–0.1)
Basophils Relative: 0 %
Eosinophils Absolute: 0.1 10*3/uL (ref 0.0–0.5)
Eosinophils Relative: 1 %
HCT: 29.2 % — ABNORMAL LOW (ref 39.0–52.0)
Hemoglobin: 9.4 g/dL — ABNORMAL LOW (ref 13.0–17.0)
Immature Granulocytes: 1 %
Lymphocytes Relative: 18 %
Lymphs Abs: 1.7 10*3/uL (ref 0.7–4.0)
MCH: 29.2 pg (ref 26.0–34.0)
MCHC: 32.2 g/dL (ref 30.0–36.0)
MCV: 90.7 fL (ref 80.0–100.0)
Monocytes Absolute: 0.8 10*3/uL (ref 0.1–1.0)
Monocytes Relative: 9 %
Neutro Abs: 7.1 10*3/uL (ref 1.7–7.7)
Neutrophils Relative %: 71 %
Platelets: 346 10*3/uL (ref 150–400)
RBC: 3.22 MIL/uL — ABNORMAL LOW (ref 4.22–5.81)
RDW: 16 % — ABNORMAL HIGH (ref 11.5–15.5)
WBC: 9.8 10*3/uL (ref 4.0–10.5)
nRBC: 0 % (ref 0.0–0.2)

## 2019-12-09 LAB — BASIC METABOLIC PANEL
Anion gap: 12 (ref 5–15)
BUN: 25 mg/dL — ABNORMAL HIGH (ref 8–23)
CO2: 21 mmol/L — ABNORMAL LOW (ref 22–32)
Calcium: 9.1 mg/dL (ref 8.9–10.3)
Chloride: 105 mmol/L (ref 98–111)
Creatinine, Ser: 1.75 mg/dL — ABNORMAL HIGH (ref 0.61–1.24)
GFR calc Af Amer: 45 mL/min — ABNORMAL LOW (ref >60)
GFR calc non Af Amer: 39 mL/min — ABNORMAL LOW (ref >60)
Glucose, Bld: 251 mg/dL — ABNORMAL HIGH (ref 70–99)
Potassium: 3.3 mmol/L — ABNORMAL LOW (ref 3.5–5.1)
Sodium: 138 mmol/L (ref 135–145)

## 2019-12-09 LAB — CBG MONITORING, ED: Glucose-Capillary: 239 mg/dL — ABNORMAL HIGH (ref 70–99)

## 2019-12-09 MED ORDER — SODIUM CHLORIDE 0.9 % IV BOLUS
1000.0000 mL | Freq: Once | INTRAVENOUS | Status: AC
  Administered 2019-12-09: 1000 mL via INTRAVENOUS

## 2019-12-09 NOTE — ED Notes (Signed)
Pt ambulated well to the BR with no assistance needed. Pt denied any dizziness or syncopal sx.

## 2019-12-09 NOTE — ED Notes (Signed)
Patient verbalizes understanding of discharge instructions. Opportunity for questioning and answers were provided. Armband removed by staff, pt discharged from ED ambulatory to home.  

## 2019-12-09 NOTE — ED Triage Notes (Signed)
Pt BIB [facility] from home after "not feeling well".  Pt had been bitten by a dog on the [place] several days ago for which he did get treatment for.   Today pt was not feeling well and upon EMS arrival, pt was hypotensive (110/48) and appeared "grey". Pt had several (5-7) syncopal episodes en route.   Pt is A&Ox4, GCS 15.  Pt was hypoxic (88% on RA) with EMS).  Given 350 NS IV and placed on 4L [place]

## 2019-12-09 NOTE — Discharge Instructions (Signed)
Return for repeat event, blood in stool or dark stool.  Eat and drink well for the next couple of days. Follow up with your family doc for recheck

## 2019-12-09 NOTE — ED Provider Notes (Signed)
[name] EMERGENCY DEPARTMENT Provider Note   CSN: [id number] Arrival date & time: 12/09/19  1958     History Chief Complaint  Patient presents with  . Animal Bite  . Loss of Consciousness    Luis Lindsey is a 69 y.o. male.  69 yo M with a chief complaints of a syncopal event.  The patient was at dinner and decided to redress his wound on his left hand after being bit by a dog about a week ago.  He felt a little bit bad and then he realized that people were yelling at him.  He apparently passed out multiple times on the way to the hospital as well.  Denies any chest pain shortness of breath abdominal pain headaches or neck pain.  He denies dark stool or blood in his stool.  Has been eating and drinking normally per him.  He currently feels much better.  Has never had this happen to him before.  The history is provided by the patient.  Animal Bite Associated symptoms: no fever and no rash   Loss of Consciousness Associated symptoms: no chest pain, no confusion, no fever, no headaches, no palpitations, no shortness of breath and no vomiting   Illness Severity:  Mild Onset quality:  Sudden Duration:  2 minutes Timing:  Constant Progression:  Unchanged Chronicity:  New Associated symptoms: no abdominal pain, no chest pain, no congestion, no diarrhea, no fever, no headaches, no myalgias, no rash, no shortness of breath and no vomiting        Past Medical History:  Diagnosis Date  . Arthritis   . BCC (basal cell carcinoma)   . BPH (benign prostatic hyperplasia)   . CAROTID BRUIT, RIGHT 11/30/2009  . DM 11/30/2009  . GERD (gastroesophageal reflux disease)   . Heart murmur   . History of hiatal hernia   . History of Holter monitoring 2009  . History of kidney stones   . HNP (herniated nucleus pulposus), lumbar    L4-5  . HYPERCHOLESTEROLEMIA 11/30/2009  . HYPERTENSION 11/30/2009  . Kidney stones   . Peripheral vascular disease ([place])   . Right carotid bruit    . SMOKER 11/30/2009  . Stroke ([place]) 2012  . URINARY CALCULUS 11/30/2009    Patient Active Problem List   Diagnosis Date Noted  . Tremor 12/07/2019  . Carotid stenosis, asymptomatic, right 08/24/2019  . Carotid disease, bilateral ([place]) 08/11/2019  . S/P lumbar laminectomy 08/01/2018  . PVD (peripheral vascular disease) ([place]) 06/10/2018  . Left lumbar radiculopathy 04/01/2018  . Peripheral neuropathy 04/01/2018  . Left leg claudication ([place]) 04/01/2018  . Status post arthroscopy of right shoulder 06/27/2017  . 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 Chronic right shoulder pain 04/16/2017  . Displaced fracture of proximal phalanx of right great toe with routine healing 03/13/2017  . Impingement syndrome of right shoulder 03/13/2017  . Displaced fracture of proximal phalanx of right great toe, initial encounter for closed fracture 02/20/2017  . Diabetes ([place]) 08/24/2013  . History of Holter monitoring   . Heart murmur   . TIA (transient ischemic attack) 12/26/2012  . Kidney stones   . DM 11/30/2009  . HYPERCHOLESTEROLEMIA 11/30/2009  . SMOKER 11/30/2009  . HYPERTENSION 11/30/2009  . URINARY CALCULUS 11/30/2009  . CAROTID BRUIT, RIGHT 11/30/2009    Past Surgical History:  Procedure Laterality Date  . ABDOMINAL AORTOGRAM W/LOWER EXTREMITY N/A 06/18/2018   Procedure: ABDOMINAL AORTOGRAM W/LOWER EXTREMITY;  Surgeon: Monica Martinez  J, MD;  Location: [facility];  Service: Cardiovascular;  Laterality: N/A;  . ABDOMINAL AORTOGRAM W/LOWER EXTREMITY N/A 12/02/2019   Procedure: ABDOMINAL AORTOGRAM W/LOWER EXTREMITY;  Surgeon: Marty Heck, MD;  Location: [facility];  Service: Cardiovascular;  Laterality: N/A;  . arthroscopy of right shoulder Right    Jan 2019, June 2019-- done at [facility], Dr. Ninfa Linden  . BACK SURGERY    . CATARACT EXTRACTION W/ INTRAOCULAR LENS  IMPLANT, BILATERAL    . CYSTOSCOPY    . ENDARTERECTOMY  Right 08/24/2019   Procedure: ENDARTERECTOMY CAROTID;  Surgeon: Marty Heck, MD;  Location: [facility];  Service: Vascular;  Laterality: Right;  . FACIAL COSMETIC SURGERY  2014  . FINGER SURGERY  2002  . LUMBAR LAMINECTOMY/DECOMPRESSION MICRODISCECTOMY Left 08/01/2018   Procedure: Microdiscectomy - Lumbar four-Lumbar five - left;  Surgeon: Eustace Moore, MD;  Location: [facility];  Service: Neurosurgery;  Laterality: Left;  . LUMBAR LAMINECTOMY/DECOMPRESSION MICRODISCECTOMY Left 10/15/2018   Procedure: Re-do Microdiscectomy - left - Lumbar four-Lumbar five;  Surgeon: Eustace Moore, MD;  Location: [facility];  Service: Neurosurgery;  Laterality: Left;  . NECK SURGERY  1986  . PERIPHERAL VASCULAR ATHERECTOMY  12/02/2019   Procedure: PERIPHERAL VASCULAR ATHERECTOMY;  Surgeon: Marty Heck, MD;  Location: [facility];  Service: Cardiovascular;;  left SFA  . PERIPHERAL VASCULAR INTERVENTION  06/18/2018   Procedure: PERIPHERAL VASCULAR INTERVENTION;  Surgeon: Marty Heck, MD;  Location: [facility];  Service: Cardiovascular;;  Left external iliac  . ROTATOR CUFF REPAIR Left   . UMBILICAL HERNIA REPAIR         Family History  Problem Relation Age of Onset  . Diabetes Sister   . Diabetes Mellitus I Sister   . Stroke Father   . Heart disease Father   . CVA Father   . Coronary artery disease Father   . Diabetes Mother   . Heart disease Mother   . Diabetes Mellitus I Mother   . Coronary artery disease Mother   . Coronary artery disease Brother   . Heart disease Brother   . Coronary artery disease Brother     Social History   Tobacco Use  . Smoking status: Former Smoker    Years: 40.00    Types: Cigarettes    Quit date: 12/2015    Years since quitting: 3.9  . Smokeless tobacco: Never Used  . Tobacco comment: Quit 12-2012  Vaping Use  . Vaping Use: Never used  Substance Use Topics  . Alcohol use: No  . Drug use: No    Home Medications Prior to Admission  medications   Medication Sig Start Date End Date Taking? Authorizing Provider  amLODipine-benazepril (LOTREL) 10-40 MG per capsule Take 1 capsule by mouth daily.      [provider]  amoxicillin-clavulanate (AUGMENTIN) 875-125 MG tablet Take 1 tablet by mouth every 12 (twelve) hours for 5 days. 12/07/19 12/12/19  McDonald, Mia A, PA-C  aspirin EC 81 MG EC tablet Take 1 tablet (81 mg total) by mouth daily at 6 (six) AM. 08/26/19   Dagoberto Ligas, PA-C  clopidogrel (PLAVIX) 75 MG tablet Take 1 tablet (75 mg total) by mouth daily. 11/24/18   Marcial Pacas, MD  gabapentin (NEURONTIN) 300 MG capsule Take 1 capsule (300 mg total) by mouth 3 (three) times daily. 12/07/19   Suzzanne Cloud, NP  hydrochlorothiazide (HYDRODIURIL) 25 MG tablet Take 25 mg by mouth daily.  [provider]  HYDROcodone-acetaminophen (NORCO/VICODIN) 5-325 MG tablet Take 1 tablet by mouth every 6 (six) hours as needed for moderate pain. 12/07/19   McDonald, Mia A, PA-C  insulin degludec (TRESIBA FLEXTOUCH) 100 UNIT/ML FlexTouch Pen Inject 60 Units into the skin daily.    [provider]  metFORMIN (GLUCOPHAGE-XR) 500 MG 24 hr tablet Take 1,000 mg by mouth 2 (two) times daily. 08/20/18   [provider]  metoprolol succinate (TOPROL-XL) 50 MG 24 hr tablet Take 50 mg by mouth daily. Take with or immediately following a meal.    [provider]  mupirocin ointment (BACTROBAN) 2 % Apply 1 application topically 2 (two) times daily. 12/07/19   Persons, Bevely Palmer, PA  [facility] VERIO test strip 1 each 3 (three) times daily. 10/29/19   [provider]  pantoprazole (PROTONIX) 40 MG tablet Take 40 mg by mouth daily with breakfast.  07/02/18   [provider]  pioglitazone (ACTOS) 45 MG tablet TAKE 1 TABLET DAILY (NEED APPOINTMENT FOR FURTHER REFILLS) Patient taking differently: Take 45 mg by mouth daily.  04/28/14   Renato Shin, MD  repaglinide (PRANDIN) 2 MG tablet Take 2 mg by mouth 3  (three) times daily before meals.  09/17/18   [provider]  rosuvastatin (CRESTOR) 40 MG tablet Take 40 mg by mouth daily.     [provider]    Allergies    Codeine  Review of Systems   Review of Systems  Constitutional: Negative for chills and fever.  HENT: Negative for congestion and facial swelling.   Eyes: Negative for discharge and visual disturbance.  Respiratory: Negative for shortness of breath.   Cardiovascular: Positive for syncope. Negative for chest pain and palpitations.  Gastrointestinal: Negative for abdominal pain, diarrhea and vomiting.  Musculoskeletal: Negative for arthralgias and myalgias.  Skin: Negative for color change and rash.  Neurological: Negative for tremors, syncope and headaches.  Psychiatric/Behavioral: Negative for confusion and dysphoric mood.    Physical Exam Updated Vital Signs BP 122/69   Pulse 65   Temp 99.1 F (37.3 C)   Resp 17   Ht 5\' 10"  (1.778 m)   Wt 79.4 kg   SpO2 100%   BMI 25.11 kg/m   Physical Exam Vitals and nursing note reviewed.  Constitutional:      Appearance: He is well-developed.  HENT:     Head: Normocephalic and atraumatic.  Eyes:     Pupils: Pupils are equal, round, and reactive to light.  Neck:     Vascular: No JVD.  Cardiovascular:     Rate and Rhythm: Normal rate and regular rhythm.     Heart sounds: No murmur heard.  No friction rub. No gallop.   Pulmonary:     Effort: No respiratory distress.     Breath sounds: No wheezing.  Abdominal:     General: There is no distension.     Tenderness: There is no abdominal tenderness. There is no guarding or rebound.  Musculoskeletal:        General: Normal range of motion.     Cervical back: Normal range of motion and neck supple.     Comments: Mild swelling to the dorsal aspect of the hand.  No erythema or drainage.  Not bleeding.  Ulnar and radial aspect on the hand with lacerations  Skin:    Coloration: Skin is not pale.      Findings: No rash.  Neurological:     Mental Status: He is alert  and oriented to person, place, and time.  Psychiatric:        Behavior: Behavior normal.     ED Results / Procedures / Treatments   Labs (all labs ordered are listed, but only abnormal results are displayed) Labs Reviewed  CBC WITH DIFFERENTIAL/PLATELET - Abnormal; Notable for the following components:      Result Value   RBC 3.22 (*)    Hemoglobin 9.4 (*)    HCT 29.2 (*)    RDW 16.0 (*)    All other components within normal limits  BASIC METABOLIC PANEL - Abnormal; Notable for the following components:   Potassium 3.3 (*)    CO2 21 (*)    Glucose, Bld 251 (*)    BUN 25 (*)    Creatinine, Ser 1.75 (*)    GFR calc non Af Amer 39 (*)    GFR calc Af Amer 45 (*)    All other components within normal limits  CBG MONITORING, ED - Abnormal; Notable for the following components:   Glucose-Capillary 239 (*)    All other components within normal limits    EKG EKG Interpretation  Date/Time:  Wednesday December 09 2019 20:08:52 EDT Ventricular Rate:  68 PR Interval:    QRS Duration: 117 QT Interval:  434 QTC Calculation: 462 R Axis:   71 Text Interpretation: Sinus or ectopic atrial rhythm Prolonged PR interval Nonspecific intraventricular conduction delay No significant change since last tracing Confirmed by Deno Etienne [phone]) on 12/09/2019 8:59:05 PM   Radiology No results found.  Procedures Procedures (including critical care time)  Medications Ordered in ED Medications  sodium chloride 0.9 % bolus 1,000 mL (0 mLs Intravenous Stopped 12/09/19 2304)    ED Course  I have reviewed the triage vital signs and the nursing notes.  Pertinent labs & imaging results that were available during my care of the patient were reviewed by me and considered in my medical decision making (see chart for details).    MDM Rules/Calculators/A&P                          69 year old male with vasovagal by history.  Patient  found to have a 3 g drop in hemoglobin from 7 days ago.  He does not endorse any dark stool or blood in his stool.  Has soft brown stool on exam.  He tells me that his wound has been bleeding pretty significantly over the past couple days though not bleeding currently.  He continues to state that he feels much better.  Will attempt to ambulate him in the ED.  Mild AKI on lab work. Ambulated without difficulty.    11:15 PM:  I have discussed the diagnosis/risks/treatment options with the patient and believe the pt to be eligible for discharge home to follow-up with PCP. We also discussed returning to the ED immediately if new or worsening sx occur. We discussed the sx which are most concerning (e.g., sudden worsening pain, fever, inability to tolerate by mouth) that necessitate immediate return. Medications administered to the patient during their visit and any new prescriptions provided to the patient are listed below.  Medications given during this visit Medications  sodium chloride 0.9 % bolus 1,000 mL (0 mLs Intravenous Stopped 12/09/19 2304)     The patient appears reasonably screen and/or stabilized for discharge and I doubt any other medical condition or other [facility] requiring further screening, evaluation, or treatment in the ED at this time prior  to discharge.   Final Clinical Impression(s) / ED Diagnoses Final diagnoses:  Syncope and collapse    Rx / DC Orders ED Discharge Orders    None       Deno Etienne, DO 12/09/19 2315

## 2019-12-10 ENCOUNTER — Other Ambulatory Visit: Payer: Self-pay

## 2019-12-10 NOTE — Patient Outreach (Signed)
  [facility]) Care Management Chronic Special Needs Program  12/10/2019  Name: Luis Lindsey DOB: [birth year]  MRN: [id number]  [name] is enrolled in a chronic special needs plan for Diabetes.  Client called to follow up from ED visit for syncope and dog bite Client called with no answer No answer and unable to leave  HIPAA compliant message. Plan for 2nd outreach call in one day Chronic care management coordinator will attempt outreach in one day.   [name] RN, Jackquline Denmark, CDE Chronic Care Management Coordinator [facility] [phone]

## 2019-12-11 ENCOUNTER — Other Ambulatory Visit: Payer: HMO

## 2019-12-11 NOTE — Patient Outreach (Signed)
  [facility]) Care Management Chronic Special Needs Program  12/11/2019  Name: CHEIKH BRAMBLE DOB: 1951-01-29  MRN: [id number]  Mr. Ayush Boulet is enrolled in a chronic special needs plan for Diabetes.  Client called to follow up from ED visit for syncope and dog bite Client called with no answer.  2nd attempt No answer and unable to leave HIPAA compliant message . Plan for 3rd outreach call in 3-4 business days Chronic care management coordinator will attempt outreach in 3-4 business days.   [name] RN, Jackquline Denmark, CDE Chronic Care Management Coordinator [facility] [phone]

## 2019-12-14 ENCOUNTER — Encounter: Payer: Self-pay | Admitting: Orthopedic Surgery

## 2019-12-14 ENCOUNTER — Ambulatory Visit: Payer: HMO | Admitting: Physician Assistant

## 2019-12-14 VITALS — Ht 70.0 in | Wt 175.0 lb

## 2019-12-14 DIAGNOSIS — S61459A Open bite of unspecified hand, initial encounter: Secondary | ICD-10-CM | POA: Diagnosis not present

## 2019-12-14 DIAGNOSIS — R55 Syncope and collapse: Secondary | ICD-10-CM | POA: Diagnosis not present

## 2019-12-14 DIAGNOSIS — I10 Essential (primary) hypertension: Secondary | ICD-10-CM | POA: Diagnosis not present

## 2019-12-14 DIAGNOSIS — W540XXA Bitten by dog, initial encounter: Secondary | ICD-10-CM | POA: Diagnosis not present

## 2019-12-14 DIAGNOSIS — D649 Anemia, unspecified: Secondary | ICD-10-CM | POA: Diagnosis not present

## 2019-12-14 DIAGNOSIS — S61452A Open bite of left hand, initial encounter: Secondary | ICD-10-CM

## 2019-12-14 DIAGNOSIS — I350 Nonrheumatic aortic (valve) stenosis: Secondary | ICD-10-CM | POA: Diagnosis not present

## 2019-12-14 DIAGNOSIS — R42 Dizziness and giddiness: Secondary | ICD-10-CM | POA: Diagnosis not present

## 2019-12-14 MED ORDER — AMOXICILLIN-POT CLAVULANATE 875-125 MG PO TABS: 1.0000 | ORAL_TABLET | Freq: Two times a day (BID) | ORAL | 0 refills | Status: AC

## 2019-12-14 NOTE — Progress Notes (Signed)
Office Visit Note   Patient: Luis Lindsey           Date of Birth: 1950/07/14           MRN: [id number] Visit Date: 12/14/2019              Requested by: Alroy Dust, [name], [place] [employer] [address] [place],  [place] [address] PCP: Alroy Dust, [name], MD  Chief Complaint  Patient presents with  . Left Hand - Follow-up    Left hand dog bite 12/06/2019      HPI: This is a pleasant 69 year old gentleman who is who is 1 week status post dog bite to his left hand.  He has been on Augmentin and doing daily dressing changes.  He states overall he is beginning to notice some decrease in swelling in his fingers.  His thumb is sore in the area of the bite but feels better than a week ago  Assessment & Plan: Visit Diagnoses: No diagnosis found.  Plan: I would like for him to continue a 2-week course of Augmentin.  He will follow-up in 1 week for reevaluation I think overall he is improving.  Continue to do active and passive range of motion of his hand to help with swelling.  Follow-Up Instructions: No follow-ups on file.   Ortho Exam  Patient is alert, oriented, no adenopathy, well-dressed, normal affect, normal respiratory effort. He has some resolving ecchymosis in his hand and his fingers.  Mild to moderate swelling in his fingers he has removed his ring.  The wounds over the thumb and over the dorsum of the hand have improved.  There is no surrounding or ascending cellulitis there is no fluctuance the skin tears are starting to heal.  He does have active motion of his thumb though it is mildly stiff distal CMS is intact.  Overall improved exam from previous  Imaging: No results found. No images are attached to the encounter.  Labs: Lab Results  Component Value Date   HGBA1C 9.2 (H) 08/18/2019   HGBA1C 8.9 (H) 07/25/2018   HGBA1C 10.2 (H) 05/14/2018     Lab Results  Component Value Date   ALBUMIN 3.9 08/18/2019    No results found for: MG No results found for:  VD25OH  No results found for: PREALBUMIN CBC EXTENDED Latest Ref Rng & Units 12/09/2019 12/02/2019 08/25/2019  WBC 4.0 - 10.5 K/uL 9.8 - 13.0(H)  RBC 4.22 - 5.81 MIL/uL 3.22(L) - 3.86(L)  HGB 13.0 - 17.0 g/dL 9.4(L) 12.6(L) 11.2(L)  HCT 39 - 52 % 29.2(L) 37.0(L) 34.8(L)  PLT 150 - 400 K/uL 346 - 280  NEUTROABS 1.7 - 7.7 K/uL 7.1 - -  LYMPHSABS 0.7 - 4.0 K/uL 1.7 - -     Body mass index is 25.11 kg/m.  Orders:  No orders of the defined types were placed in this encounter.  Meds ordered this encounter  Medications  . amoxicillin-clavulanate (AUGMENTIN) 875-125 MG tablet    Sig: Take 1 tablet by mouth 2 (two) times daily for 14 days.    Dispense:  28 tablet    Refill:  0     Procedures: No procedures performed  Clinical Data: No additional findings.  ROS:  All other systems negative, except as noted in the HPI. Review of Systems  Objective: Vital Signs: Ht 5\' 10"  (1.778 m)   Wt 175 lb (79.4 kg)   BMI 25.11 kg/m   Specialty Comments:  No specialty comments available.  PMFS History:  Patient Active Problem List   Diagnosis Date Noted  . Tremor 12/07/2019  . Carotid stenosis, asymptomatic, right 08/24/2019  . Carotid disease, bilateral ([place]) 08/11/2019  . S/P lumbar laminectomy 08/01/2018  . PVD (peripheral vascular disease) ([place]) 06/10/2018  . Left lumbar radiculopathy 04/01/2018  . Peripheral neuropathy 04/01/2018  . Left leg claudication ([place]) 04/01/2018  . Status post arthroscopy of right shoulder 06/27/2017  . 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 Chronic right shoulder pain 04/16/2017  . Displaced fracture of proximal phalanx of right great toe with routine healing 03/13/2017  . Impingement syndrome of right shoulder 03/13/2017  . Displaced fracture of proximal phalanx of right great toe, initial encounter for closed fracture 02/20/2017  . Diabetes ([place]) 08/24/2013  . History of Holter monitoring   . Heart murmur   . TIA  (transient ischemic attack) 12/26/2012  . Kidney stones   . DM 11/30/2009  . HYPERCHOLESTEROLEMIA 11/30/2009  . SMOKER 11/30/2009  . HYPERTENSION 11/30/2009  . URINARY CALCULUS 11/30/2009  . CAROTID BRUIT, RIGHT 11/30/2009   Past Medical History:  Diagnosis Date  . Arthritis   . BCC (basal cell carcinoma)   . BPH (benign prostatic hyperplasia)   . CAROTID BRUIT, RIGHT 11/30/2009  . DM 11/30/2009  . GERD (gastroesophageal reflux disease)   . Heart murmur   . History of hiatal hernia   . History of Holter monitoring 2009  . History of kidney stones   . HNP (herniated nucleus pulposus), lumbar    L4-5  . HYPERCHOLESTEROLEMIA 11/30/2009  . HYPERTENSION 11/30/2009  . Kidney stones   . Peripheral vascular disease ([place])   . Right carotid bruit   . SMOKER 11/30/2009  . Stroke ([place]) 2012  . URINARY CALCULUS 11/30/2009    Family History  Problem Relation Age of Onset  . Diabetes Sister   . Diabetes Mellitus I Sister   . Stroke Father   . Heart disease Father   . CVA Father   . Coronary artery disease Father   . Diabetes Mother   . Heart disease Mother   . Diabetes Mellitus I Mother   . Coronary artery disease Mother   . Coronary artery disease Brother   . Heart disease Brother   . Coronary artery disease Brother     Past Surgical History:  Procedure Laterality Date  . ABDOMINAL AORTOGRAM W/LOWER EXTREMITY N/A 06/18/2018   Procedure: ABDOMINAL AORTOGRAM W/LOWER EXTREMITY;  Surgeon: Marty Heck, MD;  Location: [facility];  Service: Cardiovascular;  Laterality: N/A;  . ABDOMINAL AORTOGRAM W/LOWER EXTREMITY N/A 12/02/2019   Procedure: ABDOMINAL AORTOGRAM W/LOWER EXTREMITY;  Surgeon: Marty Heck, MD;  Location: [facility];  Service: Cardiovascular;  Laterality: N/A;  . arthroscopy of right shoulder Right    Jan 2019, June 2019-- done at [facility], Dr. Ninfa Linden  . BACK SURGERY    . CATARACT EXTRACTION W/ INTRAOCULAR LENS  IMPLANT,  BILATERAL    . CYSTOSCOPY    . ENDARTERECTOMY Right 08/24/2019   Procedure: ENDARTERECTOMY CAROTID;  Surgeon: Marty Heck, MD;  Location: [facility];  Service: Vascular;  Laterality: Right;  . FACIAL COSMETIC SURGERY  2014  . FINGER SURGERY  2002  . LUMBAR LAMINECTOMY/DECOMPRESSION MICRODISCECTOMY Left 08/01/2018   Procedure: Microdiscectomy - Lumbar four-Lumbar five - left;  Surgeon: Eustace Moore, MD;  Location: [facility];  Service: Neurosurgery;  Laterality: Left;  . LUMBAR LAMINECTOMY/DECOMPRESSION MICRODISCECTOMY Left 10/15/2018   Procedure: Re-do Microdiscectomy -  left - Lumbar four-Lumbar five;  Surgeon: Eustace Moore, MD;  Location: [facility];  Service: Neurosurgery;  Laterality: Left;  . NECK SURGERY  1986  . PERIPHERAL VASCULAR ATHERECTOMY  12/02/2019   Procedure: PERIPHERAL VASCULAR ATHERECTOMY;  Surgeon: Marty Heck, MD;  Location: [facility];  Service: Cardiovascular;;  left SFA  . PERIPHERAL VASCULAR INTERVENTION  06/18/2018   Procedure: PERIPHERAL VASCULAR INTERVENTION;  Surgeon: Marty Heck, MD;  Location: [facility];  Service: Cardiovascular;;  Left external iliac  . ROTATOR CUFF REPAIR Left   . UMBILICAL HERNIA REPAIR     Social History   Occupational History  . Occupation: Truck Education administrator: [employer]  Tobacco Use  . Smoking status: Former Smoker    Years: 40.00    Types: Cigarettes    Quit date: 12/2015    Years since quitting: 3.9  . Smokeless tobacco: Never Used  . Tobacco comment: Quit 12-2012  Vaping Use  . Vaping Use: Never used  Substance and Sexual Activity  . Alcohol use: No  . Drug use: No  . Sexual activity: Not on file

## 2019-12-15 ENCOUNTER — Other Ambulatory Visit: Payer: Self-pay

## 2019-12-15 NOTE — Patient Outreach (Signed)
  [facility]) Care Management Chronic Special Needs Program  12/15/2019  Name: Luis Lindsey DOB: [birth year]  MRN: [id number]  [name] is enrolled in a chronic special needs plan for Diabetes. Reviewed and updated care plan. Client called to follow up from ED visit for syncope and dog bite  Subjective: Client states that he is going to the ortho/wound doctor to Lindsey his hand that his dog bit.  States he is on antibiotics and he is changing the dressing daily as directed.  States he saw Dr. Alroy Dust yesterday to follow up from his ED visit for passing out. States he lowered some of his B/P medication.   States his B/P has been low and he was dehydrated.  States he is checking his B/P at home daily.  States his blood sugars have been good and they range from 130 to 180 most of the time.    Goals Addressed            This Visit's Progress   . Client will verbalize knowledge of self management of Hypertension as evidences by BP reading of 140/90 or less; or as defined by provider   On track    Reports B/P varies when checked Check B/P daily and notify MD if too low or high Plan to drink enough fluids Please take your medications as prescribed. Please call 24 Hour nurse advice line as needed [phone]).     . Client will verbalize understanding of treatment plan for impaired skin integrity and follow up with provider by next 6 months       Reviewed signs and symptoms of infection to call provider Plan to take antibiotic as ordered  Plan to change dressing on left hand daily as directed by provider       Plan:  Send successful outreach letter with a copy of their individualized care plan and Send individual care plan to provider  Chronic care management coordinator will outreach as scheduled per tier level     [name] RN, Jackquline [place], [facility] [phone]

## 2019-12-21 ENCOUNTER — Encounter: Payer: Self-pay | Admitting: Physician Assistant

## 2019-12-21 ENCOUNTER — Ambulatory Visit (INDEPENDENT_AMBULATORY_CARE_PROVIDER_SITE_OTHER): Payer: HMO | Admitting: Physician Assistant

## 2019-12-21 ENCOUNTER — Other Ambulatory Visit: Payer: Self-pay

## 2019-12-21 VITALS — Ht 70.0 in | Wt 175.0 lb

## 2019-12-21 DIAGNOSIS — S61452A Open bite of left hand, initial encounter: Secondary | ICD-10-CM | POA: Diagnosis not present

## 2019-12-21 DIAGNOSIS — W540XXA Bitten by dog, initial encounter: Secondary | ICD-10-CM | POA: Diagnosis not present

## 2019-12-21 NOTE — Progress Notes (Signed)
Office Visit Note   Patient: Luis Lindsey           Date of Birth: 1950-09-02           MRN: [id number] Visit Date: 12/21/2019              Requested by: Alroy Dust, [name], [place] [employer] [address] [place],  [place] [address] PCP: Alroy Dust, [name], MD  Chief Complaint  Patient presents with  . Left Hand - Follow-up    Dog bite 2 weeks out       HPI: The patient is a very pleasant 69 year old gentleman who is now 2 weeks status post left hand dog bite.  He has been dressing this daily.  He is working on range of motion.  He also completed a course of Augmentin.  He feels much better and the swelling and the pain were both diminished he gets very little drainage he keeps the hand covered when he is out of his house  Assessment & Plan: Visit Diagnoses: No diagnosis found.  Plan: Patient will follow up in 4 weeks but may cancel this if he is doing well.  Continue to keep it covered until it is completely dried out when he is out and about.  May uncover it when he is in the house  Follow-Up Instructions: No follow-ups on file.   Ortho Exam  Patient is alert, oriented, no adenopathy, well-dressed, normal affect, normal respiratory effort. Left hand: No erythema no swelling resolving skin tears over his thumb and hand from dog bite.  There is no ascending cellulitis minimal drainage he is able to oppose all his fingers sensation intact pulse intact no sign of infection  Imaging: No results found. No images are attached to the encounter.  Labs: Lab Results  Component Value Date   HGBA1C 9.2 (H) 08/18/2019   HGBA1C 8.9 (H) 07/25/2018   HGBA1C 10.2 (H) 05/14/2018     Lab Results  Component Value Date   ALBUMIN 3.9 08/18/2019    No results found for: MG No results found for: VD25OH  No results found for: PREALBUMIN CBC EXTENDED Latest Ref Rng & Units 12/09/2019 12/02/2019 08/25/2019  WBC 4.0 - 10.5 K/uL 9.8 - 13.0(H)  RBC 4.22 - 5.81 MIL/uL 3.22(L) - 3.86(L)  HGB  13.0 - 17.0 g/dL 9.4(L) 12.6(L) 11.2(L)  HCT 39 - 52 % 29.2(L) 37.0(L) 34.8(L)  PLT 150 - 400 K/uL 346 - 280  NEUTROABS 1.7 - 7.7 K/uL 7.1 - -  LYMPHSABS 0.7 - 4.0 K/uL 1.7 - -     Body mass index is 25.11 kg/m.  Orders:  No orders of the defined types were placed in this encounter.  No orders of the defined types were placed in this encounter.    Procedures: No procedures performed  Clinical Data: No additional findings.  ROS:  All other systems negative, except as noted in the HPI. Review of Systems  Objective: Vital Signs: Ht 5\' 10"  (1.778 m)   Wt 175 lb (79.4 kg)   BMI 25.11 kg/m   Specialty Comments:  No specialty comments available.  PMFS History: Patient Active Problem List   Diagnosis Date Noted  . Tremor 12/07/2019  . Carotid stenosis, asymptomatic, right 08/24/2019  . Carotid disease, bilateral ([place]) 08/11/2019  . S/P lumbar laminectomy 08/01/2018  . PVD (peripheral vascular disease) ([place]) 06/10/2018  . Left lumbar radiculopathy 04/01/2018  . Peripheral neuropathy 04/01/2018  . Left leg claudication ([place]) 04/01/2018  . Status post arthroscopy  of right shoulder 06/27/2017  . 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 Chronic right shoulder pain 04/16/2017  . Displaced fracture of proximal phalanx of right great toe with routine healing 03/13/2017  . Impingement syndrome of right shoulder 03/13/2017  . Displaced fracture of proximal phalanx of right great toe, initial encounter for closed fracture 02/20/2017  . Diabetes ([place]) 08/24/2013  . History of Holter monitoring   . Heart murmur   . TIA (transient ischemic attack) 12/26/2012  . Kidney stones   . DM 11/30/2009  . HYPERCHOLESTEROLEMIA 11/30/2009  . SMOKER 11/30/2009  . HYPERTENSION 11/30/2009  . URINARY CALCULUS 11/30/2009  . CAROTID BRUIT, RIGHT 11/30/2009   Past Medical History:  Diagnosis Date  . Arthritis   . BCC (basal cell carcinoma)   . BPH (benign prostatic  hyperplasia)   . CAROTID BRUIT, RIGHT 11/30/2009  . DM 11/30/2009  . GERD (gastroesophageal reflux disease)   . Heart murmur   . History of hiatal hernia   . History of Holter monitoring 2009  . History of kidney stones   . HNP (herniated nucleus pulposus), lumbar    L4-5  . HYPERCHOLESTEROLEMIA 11/30/2009  . HYPERTENSION 11/30/2009  . Kidney stones   . Peripheral vascular disease ([place])   . Right carotid bruit   . SMOKER 11/30/2009  . Stroke ([place]) 2012  . URINARY CALCULUS 11/30/2009    Family History  Problem Relation Age of Onset  . Diabetes Sister   . Diabetes Mellitus I Sister   . Stroke Father   . Heart disease Father   . CVA Father   . Coronary artery disease Father   . Diabetes Mother   . Heart disease Mother   . Diabetes Mellitus I Mother   . Coronary artery disease Mother   . Coronary artery disease Brother   . Heart disease Brother   . Coronary artery disease Brother     Past Surgical History:  Procedure Laterality Date  . ABDOMINAL AORTOGRAM W/LOWER EXTREMITY N/A 06/18/2018   Procedure: ABDOMINAL AORTOGRAM W/LOWER EXTREMITY;  Surgeon: Marty Heck, MD;  Location: [place] [facility];  Service: Cardiovascular;  Laterality: N/A;  . ABDOMINAL AORTOGRAM W/LOWER EXTREMITY N/A 12/02/2019   Procedure: ABDOMINAL AORTOGRAM W/LOWER EXTREMITY;  Surgeon: Marty Heck, MD;  Location: [facility];  Service: Cardiovascular;  Laterality: N/A;  . arthroscopy of right shoulder Right    Jan 2019, June 2019-- done at [place], Dr. Ninfa Linden  . BACK SURGERY    . CATARACT EXTRACTION W/ INTRAOCULAR LENS  IMPLANT, BILATERAL    . CYSTOSCOPY    . ENDARTERECTOMY Right 08/24/2019   Procedure: ENDARTERECTOMY CAROTID;  Surgeon: Marty Heck, MD;  Location: [facility];  Service: Vascular;  Laterality: Right;  . FACIAL COSMETIC SURGERY  2014  . FINGER SURGERY  2002  . LUMBAR LAMINECTOMY/DECOMPRESSION MICRODISCECTOMY Left 08/01/2018   Procedure: Microdiscectomy -  Lumbar four-Lumbar five - left;  Surgeon: Eustace Moore, MD;  Location: [facility];  Service: Neurosurgery;  Laterality: Left;  . LUMBAR LAMINECTOMY/DECOMPRESSION MICRODISCECTOMY Left 10/15/2018   Procedure: Re-do Microdiscectomy - left - Lumbar four-Lumbar five;  Surgeon: Eustace Moore, MD;  Location: [facility];  Service: Neurosurgery;  Laterality: Left;  . NECK SURGERY  1986  . PERIPHERAL VASCULAR ATHERECTOMY  12/02/2019   Procedure: PERIPHERAL VASCULAR ATHERECTOMY;  Surgeon: Marty Heck, MD;  Location: [facility];  Service: Cardiovascular;;  left SFA  . PERIPHERAL VASCULAR INTERVENTION  06/18/2018   Procedure: PERIPHERAL VASCULAR INTERVENTION;  Surgeon: Marty Heck, MD;  Location: [facility];  Service: Cardiovascular;;  Left external iliac  . ROTATOR CUFF REPAIR Left   . UMBILICAL HERNIA REPAIR     Social History   Occupational History  . Occupation: Truck Education administrator: [employer]  Tobacco Use  . Smoking status: Former Smoker    Years: 40.00    Types: Cigarettes    Quit date: 12/2015    Years since quitting: 3.9  . Smokeless tobacco: Never Used  . Tobacco comment: Quit 12-2012  Vaping Use  . Vaping Use: Never used  Substance and Sexual Activity  . Alcohol use: No  . Drug use: No  . Sexual activity: Not on file

## 2019-12-23 ENCOUNTER — Telehealth: Payer: Self-pay | Admitting: Neurology

## 2019-12-23 NOTE — Telephone Encounter (Signed)
Pt request an appt for new symptom (dizziness). Inform patient would have to contact primary care physician for a referral.

## 2019-12-23 NOTE — Telephone Encounter (Signed)
Noted  

## 2019-12-25 DIAGNOSIS — R55 Syncope and collapse: Secondary | ICD-10-CM | POA: Diagnosis not present

## 2019-12-30 ENCOUNTER — Other Ambulatory Visit: Payer: Self-pay | Admitting: *Deleted

## 2019-12-30 DIAGNOSIS — I739 Peripheral vascular disease, unspecified: Secondary | ICD-10-CM

## 2019-12-31 ENCOUNTER — Encounter: Payer: Self-pay | Admitting: Neurology

## 2019-12-31 ENCOUNTER — Other Ambulatory Visit: Payer: Self-pay

## 2019-12-31 ENCOUNTER — Telehealth: Payer: Self-pay | Admitting: Neurology

## 2019-12-31 ENCOUNTER — Ambulatory Visit: Payer: HMO | Admitting: Neurology

## 2019-12-31 VITALS — BP 128/57 | HR 56 | Ht 70.0 in | Wt 174.0 lb

## 2019-12-31 DIAGNOSIS — H814 Vertigo of central origin: Secondary | ICD-10-CM | POA: Diagnosis not present

## 2019-12-31 DIAGNOSIS — M5416 Radiculopathy, lumbar region: Secondary | ICD-10-CM | POA: Diagnosis not present

## 2019-12-31 DIAGNOSIS — R269 Unspecified abnormalities of gait and mobility: Secondary | ICD-10-CM

## 2019-12-31 DIAGNOSIS — E1142 Type 2 diabetes mellitus with diabetic polyneuropathy: Secondary | ICD-10-CM | POA: Diagnosis not present

## 2019-12-31 NOTE — Progress Notes (Signed)
HISTORY OF PRESENT ILLNESS: Luis Lindsey is a 69 years old male, seen in request by his primary care physician Dr. Donnie Coffin for evaluation of peripheral neuropathy, initial evaluation was on April 01, 2018.  I have reviewed and summarized the referring note from the referring physician.  He has past medical history of hypertension, diabetes 20 years, hyperlipidemia, previously heavy smoker of 1 pack a day for 40 years, stopped in 2017  Since 2017, he noticed numbness tingling burning sensation at the bottom of his feet, gradually work its way up now to bilateral ankle level, since 2019, he also noticed mild bilateral fingertips numbness tingling, his fingertip tends to turn to white, sharp burning pain when he grab grocery from freezer.  Since 2016, he also noticed left lower extremity radiating pain, initially it does not put significant limitation to his daily function, now has worsening radiating pain, especially when walking, now he cannot finish shopping at a grocery store, developed left calf deep achy pain after walking short distance.  He denies similar involvement of right lower extremity, denies bowel and bladder incontinence.  Electrodiagnostic study on February 21, 2018 showed evidence of bilateral axonal sensorimotor polyneuropathy, there is no evidence of active lumbosacral radiculopathy.  He also reported, if he does not have enough water intake, he noticed more left lower extremity pain, left foot swelling will decrease with increasing water intake  UPDATE Apr 29 2018: MRI of lumbar spine November 2019, L5-S1 large left paramedian disc herniation compressing the left S1 nerve roots within the left lateral recess,  X-ray of left hip in September 2019: Mild left hip joint degenerative changes.  UPDATE November 15 2018: Peripheral vascular disease, Dr. Carlis Abbott vein specialist note on Oct 21, 2018, he had left external iliac stent on June 18, 2018, for left lower  extremity lifestyle limiting claudications, also had left L4-5 hemi-laminectomy medial facetectomy foraminotomy followed by redo microdiscectomy L4-5 on the left side by Dr. Sherley Bounds on Oct 15, 2018.  He can walk much better, only has mild left low back pain.  He is on Asa and plavix, I have advise him to be on single antiplatelet agent Plavix alone   UPDATE December 31 2019: Previously has significant left lower extremity swelling, pain with movement, left lower extremity vascular claudication is much improved since his left external iliac stent on June 18, 2018, is taking Plavix, and aspirin, he also had left lumbar decompression surgery of L4-5 by Dr. Sherley Bounds on Oct 15, 2018, his low back pain has much improved, is still seeing pain management at [facility]  Today he came in with new complaints of sudden onset dizziness, since April, most noticeable when he gets up, he felt woozy headed, taking a while to refocus, denies symptoms when sitting down, or lying down  He had a repeat ultrasound-guided access of right femoral artery angiogram, aortogram, bilateral lower extremity arteriogram, left superior femoral artery and above-knee popliteal artery mechanical orbital arthrectomy, angioplasty, drug-coated balloon on December 02, 2019  Personally reviewed CT angiogram of head and neck on August 19, 2019 showed atherosclerotic disease within aortic arch and major branches of the neck, mixed plaque within the proximal ICA, resolved focal high-grade stenosis of the proximal right ICA with radiographic straining sign, mixed plaque with high-grade stenosis at the origin of the left common carotid artery, predominantly calcified plaque at the carotid bifurcation and within the proximal ICA, up to 40% stenosis within the proximal ICA,  Right vertebral artery  is dominant, calcified plaque at the right vertebral artery origin, with mild ICA stenosis, nondominant left vertebral artery, suspected  moderate ostial stenosis,   Is on aspirin, and Plavix double antiplatelet agent,  Presented to emergency room on December 07, 2018, at he was bitten by his fully vaccinated dog to left hand,  ED again 2 days later on December 09, 2018, while he was redressing his wound on the left side, fell, had transient loss consciousness, hemoglobin was 9.4, dropped from his baseline of 12.9  Recheck by his primary care physician on December 14, 2019, hemoglobin 10.4, improved, B12 low normal at 241, ferritin was 24, he has more follow-up appointment pending with his primary care physician Dr. Donnie Coffin   REVIEW OF SYSTEMS: Out of a complete 14 system review of symptoms, the patient complains only of the following symptoms, and all other reviewed systems are negative.   ALLERGIES: Allergies  Allergen Reactions  . Codeine Nausea Only    HOME MEDICATIONS: Outpatient Medications Prior to Visit  Medication Sig Dispense Refill  . amLODipine-benazepril (LOTREL) 10-40 MG per capsule Take 1 capsule by mouth daily.      [name] aspirin EC 81 MG EC tablet Take 1 tablet (81 mg total) by mouth daily at 6 (six) AM.    . clopidogrel (PLAVIX) 75 MG tablet Take 1 tablet (75 mg total) by mouth daily. 90 tablet 4  . gabapentin (NEURONTIN) 300 MG capsule Take 1 capsule (300 mg total) by mouth 3 (three) times daily. 270 capsule 4  . hydrochlorothiazide (HYDRODIURIL) 25 MG tablet Take 25 mg by mouth daily.      . insulin degludec (TRESIBA FLEXTOUCH) 100 UNIT/ML FlexTouch Pen Inject 60 Units into the skin daily.    . metFORMIN (GLUCOPHAGE-XR) 500 MG 24 hr tablet Take 1,000 mg by mouth 2 (two) times daily.    . metoprolol succinate (TOPROL-XL) 50 MG 24 hr tablet Take 50 mg by mouth daily. Take with or immediately following a meal.    . mupirocin ointment (BACTROBAN) 2 % Apply 1 application topically 2 (two) times daily. 22 g 6  . ONETOUCH VERIO test strip 1 each 3 (three) times daily.    . pantoprazole (PROTONIX) 40 MG tablet Take  40 mg by mouth daily with breakfast.     . pioglitazone (ACTOS) 45 MG tablet TAKE 1 TABLET DAILY (NEED APPOINTMENT FOR FURTHER REFILLS) (Patient taking differently: Take 45 mg by mouth daily. ) 90 tablet 0  . repaglinide (PRANDIN) 2 MG tablet Take 2 mg by mouth 3 (three) times daily before meals.     . rosuvastatin (CRESTOR) 40 MG tablet Take 40 mg by mouth daily.     [name] HYDROcodone-acetaminophen (NORCO/VICODIN) 5-325 MG tablet Take 1 tablet by mouth every 6 (six) hours as needed for moderate pain. 6 tablet 0   No facility-administered medications prior to visit.    PAST MEDICAL HISTORY: Past Medical History:  Diagnosis Date  . Arthritis   . BCC (basal cell carcinoma)   . BPH (benign prostatic hyperplasia)   . CAROTID BRUIT, RIGHT 11/30/2009  . DM 11/30/2009  . GERD (gastroesophageal reflux disease)   . Heart murmur   . History of hiatal hernia   . History of Holter monitoring 2009  . History of kidney stones   . HNP (herniated nucleus pulposus), lumbar    L4-5  . HYPERCHOLESTEROLEMIA 11/30/2009  . HYPERTENSION 11/30/2009  . Kidney stones   . Peripheral vascular disease ([place])   . Right carotid  bruit   . SMOKER 11/30/2009  . Stroke ([place]) 2012  . URINARY CALCULUS 11/30/2009    PAST SURGICAL HISTORY: Past Surgical History:  Procedure Laterality Date  . ABDOMINAL AORTOGRAM W/LOWER EXTREMITY N/A 06/18/2018   Procedure: ABDOMINAL AORTOGRAM W/LOWER EXTREMITY;  Surgeon: Marty Heck, MD;  Location: [facility];  Service: Cardiovascular;  Laterality: N/A;  . ABDOMINAL AORTOGRAM W/LOWER EXTREMITY N/A 12/02/2019   Procedure: ABDOMINAL AORTOGRAM W/LOWER EXTREMITY;  Surgeon: Marty Heck, MD;  Location: [facility];  Service: Cardiovascular;  Laterality: N/A;  . arthroscopy of right shoulder Right    Jan 2019, June 2019-- done at [facility], Dr. Ninfa Linden  . BACK SURGERY    . CATARACT EXTRACTION W/ INTRAOCULAR LENS  IMPLANT, BILATERAL    . CYSTOSCOPY      . ENDARTERECTOMY Right 08/24/2019   Procedure: ENDARTERECTOMY CAROTID;  Surgeon: Marty Heck, MD;  Location: [facility];  Service: Vascular;  Laterality: Right;  . FACIAL COSMETIC SURGERY  2014  . FINGER SURGERY  2002  . LUMBAR LAMINECTOMY/DECOMPRESSION MICRODISCECTOMY Left 08/01/2018   Procedure: Microdiscectomy - Lumbar four-Lumbar five - left;  Surgeon: Eustace Moore, MD;  Location: [facility];  Service: Neurosurgery;  Laterality: Left;  . LUMBAR LAMINECTOMY/DECOMPRESSION MICRODISCECTOMY Left 10/15/2018   Procedure: Re-do Microdiscectomy - left - Lumbar four-Lumbar five;  Surgeon: Eustace Moore, MD;  Location: [place] [facility];  Service: Neurosurgery;  Laterality: Left;  . NECK SURGERY  1986  . PERIPHERAL VASCULAR ATHERECTOMY  12/02/2019   Procedure: PERIPHERAL VASCULAR ATHERECTOMY;  Surgeon: Marty Heck, MD;  Location: [facility];  Service: Cardiovascular;;  left SFA  . PERIPHERAL VASCULAR INTERVENTION  06/18/2018   Procedure: PERIPHERAL VASCULAR INTERVENTION;  Surgeon: Marty Heck, MD;  Location: [facility];  Service: Cardiovascular;;  Left external iliac  . ROTATOR CUFF REPAIR Left   . UMBILICAL HERNIA REPAIR      FAMILY HISTORY: Family History  Problem Relation Age of Onset  . Diabetes Sister   . Diabetes Mellitus I Sister   . Stroke Father   . Heart disease Father   . CVA Father   . Coronary artery disease Father   . Diabetes Mother   . Heart disease Mother   . Diabetes Mellitus I Mother   . Coronary artery disease Mother   . Coronary artery disease Brother   . Heart disease Brother   . Coronary artery disease Brother     SOCIAL HISTORY: Social History   Socioeconomic History  . Marital status: Married    Spouse name: [name]   . Number of children: 3  . Years of education: 41  . Highest education level: Not on file  Occupational History  . Occupation: Truck Education administrator: [employer]  Tobacco Use  . Smoking status: Former Smoker    Years:  40.00    Types: Cigarettes    Quit date: 12/2015    Years since quitting: 4.0  . Smokeless tobacco: Never Used  . Tobacco comment: Quit 12-2012  Vaping Use  . Vaping Use: Never used  Substance and Sexual Activity  . Alcohol use: No  . Drug use: No  . Sexual activity: Not on file  Other Topics Concern  . Not on file  Social History Narrative   . Patient drinks 2-3 cups of caffeine daily.    Patient lives at home with his wife [name].    Patient works at [employer].  Education. 12 th grade   Right handed               Social Determinants of Health   Financial Resource Strain:   . Difficulty of Paying Living Expenses:   Food Insecurity: No Food Insecurity  . Worried About Charity fundraiser in the Last Year: Never true  . Ran Out of Food in the Last Year: Never true  Transportation Needs: No Transportation Needs  . Lack of Transportation (Medical): No  . Lack of Transportation (Non-Medical): No  Physical Activity:   . Days of Exercise per Week:   . Minutes of Exercise per Session:   Stress:   . Feeling of Stress :   Social Connections:   . Frequency of Communication with Friends and Family:   . Frequency of Social Gatherings with Friends and Family:   . Attends Religious Services:   . Active Member of Clubs or Organizations:   . Attends Archivist Meetings:   [name] Marital Status:   Intimate Partner Violence:   . Fear of Current or Ex-Partner:   . Emotionally Abused:   [name] Physically Abused:   . Sexually Abused:    PHYSICAL EXAM  Vitals:   12/31/19 1135  BP: (!) 128/57  Pulse: (!) 56  Weight: 174 lb (78.9 kg)  Height: 5\' 10"  (1.778 m)   Body mass index is 24.97 kg/m.   PHYSICAL EXAMNIATION:  Gen: NAD, conversant, well nourised, well groomed                     Cardiovascular: Regular rate rhythm, no peripheral edema, warm, nontender. Eyes: Conjunctivae clear without exudates or hemorrhage Neck: Supple, no carotid bruits. Pulmonary: Clear to  auscultation bilaterally   NEUROLOGICAL EXAM:  MENTAL STATUS: Speech:    Speech is normal; fluent and spontaneous with normal comprehension.  Cognition:     Orientation to time, place and person     Normal recent and remote memory     Normal Attention span and concentration     Normal Language, naming, repeating,spontaneous speech     Fund of knowledge   CRANIAL NERVES: CN II: Visual fields are full to confrontation.  Pupils are round equal and briskly reactive to light. CN III, IV, VI: extraocular movement are normal. No ptosis. CN V: Facial sensation is intact to pinprick in all 3 divisions bilaterally. Corneal responses are intact.  CN VII: Face is symmetric with normal eye closure and smile. CN VIII: Hearing is normal to casual conversation CN IX, X: Palate elevates symmetrically. Phonation is normal. CN XI: Head turning and shoulder shrug are intact CN XII: Tongue is midline with normal movements and no atrophy.  MOTOR: There is no pronator drift of out-stretched arms. Muscle bulk and tone are normal. Muscle strength is normal.  REFLEXES: Reflexes are 1 and symmetric at the biceps, triceps, knees, and absent at ankles. Plantar responses are flexor.  SENSORY: Intact vibratory sensation light touch at foot COORDINATION: Rapid alternating movements and fine finger movements are intact. There is no dysmetria on finger-to-nose and heel-knee-shin.    GAIT/STANCE: He needs push-up to get up from seated position, dragging left leg some, steady   DIAGNOSTIC DATA (LABS, IMAGING, TESTING) - I reviewed patient records, labs, notes, testing and imaging myself where available.  Lab Results  Component Value Date   WBC 9.8 12/09/2019   HGB 9.4 (L) 12/09/2019   HCT 29.2 (L) 12/09/2019   MCV 90.7 12/09/2019  PLT 346 12/09/2019      Component Value Date/Time   NA 138 12/09/2019 2030   K 3.3 (L) 12/09/2019 2030   CL 105 12/09/2019 2030   CO2 21 (L) 12/09/2019 2030   GLUCOSE  251 (H) 12/09/2019 2030   BUN 25 (H) 12/09/2019 2030   CREATININE 1.75 (H) 12/09/2019 2030   CALCIUM 9.1 12/09/2019 2030   PROT 7.9 08/18/2019 1001   ALBUMIN 3.9 08/18/2019 1001   AST 25 08/18/2019 1001   ALT 28 08/18/2019 1001   ALKPHOS 86 08/18/2019 1001   BILITOT 0.9 08/18/2019 1001   GFRNONAA 39 (L) 12/09/2019 2030   GFRAA 45 (L) 12/09/2019 2030   No results found for: CHOL, HDL, LDLCALC, LDLDIRECT, TRIG, CHOLHDL Lab Results  Component Value Date   HGBA1C 9.2 (H) 08/18/2019   No results found for: VITAMINB12 No results found for: TSH    ASSESSMENT AND PLAN 69 y.o. year old male    Significant peripheral vascular disease  Status post left superficial femoral artery and above-knee popliteal artery mechanical arthrectomy and angioplasty on December 02, 2019  Anemia  Following his dog bite, he did reported to excessive bleeding taking aspirin and Plavix, repeat hemoglobin showed some improvement,  New onset dizziness  Potential etiology including hypoperfusion of the brain, in the setting of anemia, significant cranial artery disease, polypharmacy likely contributed to, he denies any improvement with gabapentin, I have suggested him to stop  MRI of brain to rule out new vascular events  Low back pain, radiating pain to left lower extremity,  History of lumbar decompression surgery, continue under pain management,  Total time spent reviewing the chart, obtaining history, examined patient, ordering tests, documentation, consultations and family, care coordination was 108 minutes     Marcial Pacas, M.D. Ph.D.  [facility] [place], [place] [address] Phone: [phone] Fax:      [phone]

## 2019-12-31 NOTE — Telephone Encounter (Signed)
health team order sent to GI. No auth they will reach out to the patient to schedule.  

## 2020-01-05 ENCOUNTER — Encounter (HOSPITAL_COMMUNITY): Payer: HMO

## 2020-01-11 DIAGNOSIS — M5416 Radiculopathy, lumbar region: Secondary | ICD-10-CM | POA: Diagnosis not present

## 2020-01-12 ENCOUNTER — Other Ambulatory Visit: Payer: Self-pay

## 2020-01-12 ENCOUNTER — Ambulatory Visit
Admission: RE | Admit: 2020-01-12 | Discharge: 2020-01-12 | Disposition: A | Discharge status: Home / Self Care | Payer: HMO | Source: Ambulatory Visit | Attending: Neurology | Admitting: Neurology

## 2020-01-12 DIAGNOSIS — H814 Vertigo of central origin: Secondary | ICD-10-CM

## 2020-01-12 NOTE — Progress Notes (Signed)
I have reviewed and agreed above plan. 

## 2020-01-13 ENCOUNTER — Ambulatory Visit (INDEPENDENT_AMBULATORY_CARE_PROVIDER_SITE_OTHER)
Admission: RE | Admit: 2020-01-13 | Discharge: 2020-01-13 | Disposition: A | Discharge status: Home / Self Care | Payer: HMO | Source: Ambulatory Visit | Attending: Vascular Surgery | Admitting: Vascular Surgery

## 2020-01-13 ENCOUNTER — Ambulatory Visit (HOSPITAL_COMMUNITY)
Admission: RE | Admit: 2020-01-13 | Discharge: 2020-01-13 | Disposition: A | Discharge status: Home / Self Care | Payer: HMO | Source: Ambulatory Visit | Attending: Vascular Surgery | Admitting: Vascular Surgery

## 2020-01-13 ENCOUNTER — Ambulatory Visit (INDEPENDENT_AMBULATORY_CARE_PROVIDER_SITE_OTHER): Payer: HMO | Admitting: Physician Assistant

## 2020-01-13 VITALS — BP 147/59 | HR 51 | Temp 98.1°F | Resp 20 | Ht 70.0 in | Wt 166.7 lb

## 2020-01-13 DIAGNOSIS — I739 Peripheral vascular disease, unspecified: Secondary | ICD-10-CM

## 2020-01-13 DIAGNOSIS — R0989 Other specified symptoms and signs involving the circulatory and respiratory systems: Secondary | ICD-10-CM | POA: Diagnosis not present

## 2020-01-13 NOTE — Progress Notes (Signed)
Office Note     CC:  follow up Requesting Provider:  Alroy Dust, [name], MD  HPI: Luis Lindsey is a 69 y.o. ([birth year]) male who presents for 1 month follow-up status post atherectomy with angioplasty of left SFA and above-knee popliteal artery on December 02, 2019 by Dr. Carlis Abbott. He previously had a left external iliac stent for left lower extremity claudication. Patient recently was seen in follow-up and has had worsening of his left legclaudication symptoms with monophasic runoff at the ankle. He states he felt immediate improvement in his left leg pain after his procedure.  He also reports improvement in left ankle edema.  No complications with groin stick.  No rest pain.  He is compliant with Plavix, aspirin and statin.  He is status post left carotid endarterectomy in March of this year.  He denies extremity weakness, slurred speech, facial drooping or monocular blindness.  Past Medical History:  Diagnosis Date  . Arthritis   . BCC (basal cell carcinoma)   . BPH (benign prostatic hyperplasia)   . CAROTID BRUIT, RIGHT 11/30/2009  . DM 11/30/2009  . GERD (gastroesophageal reflux disease)   . Heart murmur   . History of hiatal hernia   . History of Holter monitoring 2009  . History of kidney stones   . HNP (herniated nucleus pulposus), lumbar    L4-5  . HYPERCHOLESTEROLEMIA 11/30/2009  . HYPERTENSION 11/30/2009  . Kidney stones   . Peripheral vascular disease ([place])   . Right carotid bruit   . SMOKER 11/30/2009  . Stroke ([place]) 2012  . URINARY CALCULUS 11/30/2009    Past Surgical History:  Procedure Laterality Date  . ABDOMINAL AORTOGRAM W/LOWER EXTREMITY N/A 06/18/2018   Procedure: ABDOMINAL AORTOGRAM W/LOWER EXTREMITY;  Surgeon: Marty Heck, MD;  Location: [facility];  Service: Cardiovascular;  Laterality: N/A;  . ABDOMINAL AORTOGRAM W/LOWER EXTREMITY N/A 12/02/2019   Procedure: ABDOMINAL AORTOGRAM W/LOWER EXTREMITY;  Surgeon: Marty Heck, MD;  Location: [facility];  Service: Cardiovascular;  Laterality: N/A;  . arthroscopy of right shoulder Right    Jan 2019, June 2019-- done at [facility], Dr. Ninfa Linden  . BACK SURGERY    . CATARACT EXTRACTION W/ INTRAOCULAR LENS  IMPLANT, BILATERAL    . CYSTOSCOPY    . ENDARTERECTOMY Right 08/24/2019   Procedure: ENDARTERECTOMY CAROTID;  Surgeon: Marty Heck, MD;  Location: [facility];  Service: Vascular;  Laterality: Right;  . FACIAL COSMETIC SURGERY  2014  . FINGER SURGERY  2002  . LUMBAR LAMINECTOMY/DECOMPRESSION MICRODISCECTOMY Left 08/01/2018   Procedure: Microdiscectomy - Lumbar four-Lumbar five - left;  Surgeon: Eustace Moore, MD;  Location: [facility];  Service: Neurosurgery;  Laterality: Left;  . LUMBAR LAMINECTOMY/DECOMPRESSION MICRODISCECTOMY Left 10/15/2018   Procedure: Re-do Microdiscectomy - left - Lumbar four-Lumbar five;  Surgeon: Eustace Moore, MD;  Location: [facility];  Service: Neurosurgery;  Laterality: Left;  . NECK SURGERY  1986  . PERIPHERAL VASCULAR ATHERECTOMY  12/02/2019   Procedure: PERIPHERAL VASCULAR ATHERECTOMY;  Surgeon: Marty Heck, MD;  Location: [facility];  Service: Cardiovascular;;  left SFA  . PERIPHERAL VASCULAR INTERVENTION  06/18/2018   Procedure: PERIPHERAL VASCULAR INTERVENTION;  Surgeon: Marty Heck, MD;  Location: [facility];  Service: Cardiovascular;;  Left external iliac  . ROTATOR CUFF REPAIR Left   . UMBILICAL HERNIA REPAIR      Social History   Socioeconomic History  . Marital status: Married  Spouse name: Luis Lindsey   . Number of children: 3  . Years of education: 94  . Highest education level: Not on file  Occupational History  . Occupation: Truck Education administrator: [employer]  Tobacco Use  . Smoking status: Former Smoker    Years: 40.00    Types: Cigarettes    Quit date: 12/2015    Years since quitting: 4.0  . Smokeless tobacco: Never Used  . Tobacco comment: Quit 12-2012  Vaping Use  . Vaping Use:  Never used  Substance and Sexual Activity  . Alcohol use: No  . Drug use: No  . Sexual activity: Not on file  Other Topics Concern  . Not on file  Social History Narrative   . Patient drinks 2-3 cups of caffeine daily.    Patient lives at home with his wife Luis Lindsey.    Patient works at [employer].    Education. 12 th grade   Right handed               Social Determinants of Health   Financial Resource Strain:   . Difficulty of Paying Living Expenses:   Food Insecurity: No Food Insecurity  . Worried About Charity fundraiser in the Last Year: Never true  . Ran Out of Food in the Last Year: Never true  Transportation Needs: No Transportation Needs  . Lack of Transportation (Medical): No  . Lack of Transportation (Non-Medical): No  Physical Activity:   . Days of Exercise per Week:   . Minutes of Exercise per Session:   Stress:   . Feeling of Stress :   Social Connections:   . Frequency of Communication with Friends and Family:   . Frequency of Social Gatherings with Friends and Family:   . Attends Religious Services:   . Active Member of Clubs or Organizations:   . Attends Archivist Meetings:   [name] Marital Status:   Intimate Partner Violence:   . Fear of Current or Ex-Partner:   . Emotionally Abused:   [name] Physically Abused:   . Sexually Abused:    Family History  Problem Relation Age of Onset  . Diabetes Sister   . Diabetes Mellitus I Sister   . Stroke Father   . Heart disease Father   . CVA Father   . Coronary artery disease Father   . Diabetes Mother   . Heart disease Mother   . Diabetes Mellitus I Mother   . Coronary artery disease Mother   . Coronary artery disease Brother   . Heart disease Brother   . Coronary artery disease Brother     Current Outpatient Medications  Medication Sig Dispense Refill  . amLODipine-benazepril (LOTREL) 10-40 MG per capsule Take 1 capsule by mouth daily.      [name] aspirin EC 81 MG EC tablet Take 1 tablet (81 mg total) by  mouth daily at 6 (six) AM.    . clopidogrel (PLAVIX) 75 MG tablet Take 1 tablet (75 mg total) by mouth daily. 90 tablet 4  . hydrochlorothiazide (HYDRODIURIL) 25 MG tablet Take 25 mg by mouth daily.      . insulin degludec (TRESIBA FLEXTOUCH) 100 UNIT/ML FlexTouch Pen Inject 60 Units into the skin daily.    . metFORMIN (GLUCOPHAGE-XR) 500 MG 24 hr tablet Take 1,000 mg by mouth 2 (two) times daily.    . metoprolol succinate (TOPROL-XL) 50 MG 24 hr tablet Take 50 mg by mouth daily. Take with or immediately following a  meal.    . ONETOUCH VERIO test strip 1 each 3 (three) times daily.    . pantoprazole (PROTONIX) 40 MG tablet Take 40 mg by mouth daily with breakfast.     . pioglitazone (ACTOS) 45 MG tablet TAKE 1 TABLET DAILY (NEED APPOINTMENT FOR FURTHER REFILLS) (Patient taking differently: Take 45 mg by mouth daily. ) 90 tablet 0  . repaglinide (PRANDIN) 2 MG tablet Take 2 mg by mouth 3 (three) times daily before meals.     . rosuvastatin (CRESTOR) 40 MG tablet Take 40 mg by mouth daily.      No current facility-administered medications for this visit.    Allergies  Allergen Reactions  . Codeine Nausea Only     REVIEW OF SYSTEMS:   [X]  denotes positive finding, [ ]  denotes negative finding Cardiac  Comments:  Chest pain or chest pressure:    Shortness of breath upon exertion:    Short of breath when lying flat:    Irregular heart rhythm:        Vascular    Pain in calf, thigh, or hip brought on by ambulation:    Pain in feet at night that wakes you up from your sleep:     Blood clot in your veins:    Leg swelling:         Pulmonary    Oxygen at home:    Productive cough:     Wheezing:         Neurologic    Sudden weakness in arms or legs:     Sudden numbness in arms or legs:     Sudden onset of difficulty speaking or slurred speech:    Temporary loss of vision in one eye:     Problems with dizziness:         Gastrointestinal    Blood in stool:     Vomited blood:           Genitourinary    Burning when urinating:     Blood in urine:        Psychiatric    Major depression:         Hematologic    Bleeding problems:    Problems with blood clotting too easily:        Skin    Rashes or ulcers:        Constitutional    Fever or chills:      PHYSICAL EXAMINATION:  Vitals:   01/13/20 1040  BP: (!) 147/59  Pulse: (!) 51  Resp: 20  Temp: 98.1 F (36.7 C)  TempSrc: Temporal  SpO2: 100%  Weight: 166 lb 11.2 oz (75.6 kg)  Height: 5\' 10"  (1.778 m)    General:  WDWN in NAD; vital signs documented above Gait: Unaided, no ataxia HENT: WNL, normocephalic Pulmonary: normal non-labored breathing , without Rales, rhonchi,  wheezing Cardiac: regular HR, with  Murmurs with carotid bruit Abdomen: soft, NT, no masses Skin: without rashes Vascular Exam/Pulses: Palpable femoral pulses bilaterally.  Pedal pulses are not palpable Extremities: without ischemic changes, without Gangrene , without cellulitis; without open wounds; sensation and motor function are intact. Musculoskeletal: no muscle wasting or atrophy  Neurologic: A&O X 3;  No focal weakness or paresthesias are detected Psychiatric:  The pt has Normal affect.   Non-Invasive Vascular Imaging:   01/13/2020 ABI/TBIToday's ABIToday's TBIPrevious ABIPrevious TBI  +-------+-----------+-----------+------------+------------+  Right 0.95    0.66    0.77    0.51      +-------+-----------+-----------+------------+------------+  Left  1.0    0.48    0.6     0.32   Biphasic anterior tibial artery waveforms bilaterally Right great toe pressure 97 Left great toe pressure 71  Left lower extremity arterial duplex Summary:  Left: 50-74% stenosis noted in the deep femoral artery. 50-74% stenosis  noted in the proximal superficial femoral artery.    ASSESSMENT/PLAN:: 69 y.o. male here for follow up for left above-knee popliteal and left SFA atherectomy with  balloon angioplasty.  Previous left external iliac artery stent placed in 2020.  Duplex reveals elevated velocities of the distal femoral artery and proximal superficial femoral artery consistent with approximately 50 to 74% stenosis.  The patient is stating his left lower extremity pain is resolved.  He has no rest pain.   He has no signs or symptoms of stroke/TIA.  We reviewed the symptoms.  Advised him to call for immediate medical attention should these occur.  We will follow-up in March 2022 with carotid duplex, ABIs and left iliac and LLE duplex  Advised the patient to call our office if he redevelops lower extremity pain or nonhealing wounds.  Continue aspirin, Plavix and statin  Barbie Banner, PA-C [facility] [phone]  Clinic MD:   Scot Dock

## 2020-01-14 DIAGNOSIS — R195 Other fecal abnormalities: Secondary | ICD-10-CM | POA: Diagnosis not present

## 2020-01-18 ENCOUNTER — Ambulatory Visit: Payer: HMO | Admitting: Physician Assistant

## 2020-01-19 ENCOUNTER — Telehealth: Payer: Self-pay | Admitting: *Deleted

## 2020-01-19 NOTE — Progress Notes (Signed)
   Dr Rhea Belton patient= unchanged MRI brain .  IMPRESSION: This MRI of the brain without contrast shows the following: 1.    Chronic lacunar infarctions in the right anterior basal ganglia, left central pons and bilateral thalamus.  These were all present on the 2014 MRI as well. 2.    Abnormal signal in the right parietal lobe that could represent either a remote small stroke or a ventriculostomy track.   3.    Mild chronic microvascular ischemic changes elsewhere in the hemispheres 4.    Mild generalized cortical atrophy progressed compared to the previous MRI.     INTERPRETING PHYSICIAN:  Richard A. Felecia Shelling, MD, PhD, Charlynn Grimes

## 2020-01-19 NOTE — Telephone Encounter (Signed)
I called the patient and provided him with Dr. Edwena Felty response below. He will keep his pending follow up w/ Dr. Krista Blue on 04/06/20 for further discussion of care.

## 2020-01-19 NOTE — Telephone Encounter (Signed)
-----   Message from Larey Seat, MD sent at 01/19/2020  4:00 PM EDT -----   Dr Rhea Belton patient= unchanged MRI brain .  IMPRESSION: This MRI of the brain without contrast shows the following: 1.    Chronic lacunar infarctions in the right anterior basal ganglia, left central pons and bilateral thalamus.  These were all present on the 2014 MRI as well. 2.    Abnormal signal in the right parietal lobe that could represent either a remote small stroke or a ventriculostomy track.   3.    Mild chronic microvascular ischemic changes elsewhere in the hemispheres 4.    Mild generalized cortical atrophy progressed compared to the previous MRI.     INTERPRETING PHYSICIAN:  Richard A. Felecia Shelling, MD, PhD, Charlynn Grimes

## 2020-01-22 DIAGNOSIS — D649 Anemia, unspecified: Secondary | ICD-10-CM | POA: Diagnosis not present

## 2020-02-11 DIAGNOSIS — Z1159 Encounter for screening for other viral diseases: Secondary | ICD-10-CM | POA: Diagnosis not present

## 2020-02-15 DIAGNOSIS — D509 Iron deficiency anemia, unspecified: Secondary | ICD-10-CM | POA: Diagnosis not present

## 2020-02-15 DIAGNOSIS — K621 Rectal polyp: Secondary | ICD-10-CM | POA: Diagnosis not present

## 2020-02-15 DIAGNOSIS — R195 Other fecal abnormalities: Secondary | ICD-10-CM | POA: Diagnosis not present

## 2020-02-15 DIAGNOSIS — D124 Benign neoplasm of descending colon: Secondary | ICD-10-CM | POA: Diagnosis not present

## 2020-02-15 DIAGNOSIS — K449 Diaphragmatic hernia without obstruction or gangrene: Secondary | ICD-10-CM | POA: Diagnosis not present

## 2020-02-17 DIAGNOSIS — K621 Rectal polyp: Secondary | ICD-10-CM | POA: Diagnosis not present

## 2020-02-17 DIAGNOSIS — D124 Benign neoplasm of descending colon: Secondary | ICD-10-CM | POA: Diagnosis not present

## 2020-03-10 DIAGNOSIS — I350 Nonrheumatic aortic (valve) stenosis: Secondary | ICD-10-CM | POA: Diagnosis not present

## 2020-03-10 DIAGNOSIS — I10 Essential (primary) hypertension: Secondary | ICD-10-CM | POA: Diagnosis not present

## 2020-03-10 DIAGNOSIS — Z23 Encounter for immunization: Secondary | ICD-10-CM | POA: Diagnosis not present

## 2020-03-10 DIAGNOSIS — K219 Gastro-esophageal reflux disease without esophagitis: Secondary | ICD-10-CM | POA: Diagnosis not present

## 2020-03-10 DIAGNOSIS — R42 Dizziness and giddiness: Secondary | ICD-10-CM | POA: Diagnosis not present

## 2020-03-10 DIAGNOSIS — E1142 Type 2 diabetes mellitus with diabetic polyneuropathy: Secondary | ICD-10-CM | POA: Diagnosis not present

## 2020-03-10 DIAGNOSIS — E782 Mixed hyperlipidemia: Secondary | ICD-10-CM | POA: Diagnosis not present

## 2020-03-15 ENCOUNTER — Ambulatory Visit: Payer: HMO

## 2020-03-16 ENCOUNTER — Other Ambulatory Visit: Payer: Self-pay

## 2020-03-16 NOTE — Patient Outreach (Signed)
[facility]) Care Management Chronic Special Needs Program  03/16/2020  Name: Luis Lindsey DOB: July 29, 1950  MRN: [id number]  [name] is enrolled in a chronic special needs plan for Diabetes. Reviewed and updated care plan.  Subjective:Client states that he saw Dr. Alroy Lindsey last week and he got his flu shot.  States he is not sure what his Hemoglobin A1C was but his doctor did not change any of his medications.  States his blood sugars range from 150-200 at different times of the day.  States his sugars read higher when he has rice at his meals.   Denies any low blood sugars.  States he is taking his medication as ordered and does not miss doses.  States he is getting his [place] with pharmacy assistance  States his B/P has been good and has not gone too low.  States his hand has healed from his dog bite.  States he is walking his dog 20 minutes a day.  States he has not called to make an eye appointment yet.  States he has gotten both of his COVID shots.  Goals Addressed              This Visit's Progress   .  Client will not report change from baseline and no repeated symptoms of stroke with in the next 12 months(continue 08/26/19   On track     No admission or ED visit for stroke Denies any stroke symptoms Reviewed stroke signs and symptoms and when to call 911    .  COMPLETED: Client will report abillity to obtain Medications within next 6 months   On track     Client has been approved for pharmacy assistance Reports no issues with affording medications at this time Goal completed 09/29/19    .  Client will report no fall or injuries in the next 6 months.(continue 03/16/20)   On track     Reports no falls Stand up slowly Use cane or walker at all times Use grabber to pick up objects on the floor Keep home well-lit and wear your glasses      .  Client will verbalize knowledge of self management of Hypertension as evidences by BP reading of 140/90 or  less; or as defined by provider   On track     Reports B/P improved with last reading of 136/68 at MD appointment Check B/P daily and notify MD if too low or high Plan to drink enough fluids Please take your medications as prescribed. Reviewed over the counter benefit he can use to buy a new B/P monitor if needed You can call [phone] for over the counter catalog       .  COMPLETED: Client will verbalize understanding of treatment plan for impaired skin integrity and follow up with provider by next 6 months   On track     Reports hand healed Goal completed 03/16/20    .  Decrease inpatient admissions/ readmissions with in the next year   On track     Client had scheduled admission on 08/24/19 for rt endarterectomy  No unplanned admissions    .  Diabetes Patient stated goal to have annual eye exam (pt-stated)   Not on track     Reinforced importance of having an annual dilated eye exam Reviewed  list of in network eye providers with client Plan to call and make eye exam appointment    .  HEMOGLOBIN A1C < 7  Last Hemoglobin A1C 10% on 03/10/20 Reinforced fasting blood sugar goals of 80-130 and less than 180 1 1/2-2 hours after meals Reinforced to follow a low carbohydrate low salt diet and to watch portion sizes, reviewed rice portions Reviewed use and possible side effects of diabetes medications  Reviewed signs and symptoms of hypoglycemia and actions to take Plan to exercise 150 minutes a week including two sessions of resistance exercise weekly 8PM Optional snack: peanut butter on toast, cheese + cheese, crackers or fruit + nuts Follow Plate Method: (create a balanced plate): 1/2 plate vegetables (broccoli, cauliflower, carrots, brussels sprouts, turnip greens), Aim for 1 cup cooked or 2 cups raw 1/4 starch (ex- pasta, rice, roll, bread), Aim for 1/2-1 cup, Choose whole grains whenever possible 1/4 protein (ex- chicken, beef, pork & all beans), Aim for 1 palm size or  1/2 cup, choose lean protein whenever possible and limit processed meats +1 healthy fat (ex- olive oil, avocado, nuts, seeds, dressing, mayonnaise), Aim for 2-3 teaspoons    .  COMPLETED: Maintain timely refills of diabetic medication as prescribed within the year .   On track     Maintaining timely refills of medications per dispense report It is important to get your medications refilled on time Goal completed 09/29/19    .  COMPLETED: Obtain annual  Lipid Profile, LDL-C   On track     Last completed 09/04/19 LDL 71 The goal for LDL is less than 70 mg/dL as you are at high risk for complications Plan to take statin as ordered Goal completed 09/29/19    .  Obtain Annual Eye (retinal)  Exam    Not on track     Plan to call and schedule eye exam    .  Obtain Annual Foot Exam   Not on track     Last completed 09/01/18 Reports no sores or open areas on feet Check your skin and feet every day for cuts, bruises, redness, blisters, or sores. Report to provider any problems with your feet Schedule a foot exam with your health care provider once every year    .  COMPLETED: Obtain annual screen for micro albuminuria (urine) , nephropathy (kidney problems)   On track     Last completed 03/10/20 It is important for your doctor to check your urine for protein at least every year Goal completed 03/10/20    .  COMPLETED: Obtain Hemoglobin A1C at least 2 times per year   On track     Completed 08/18/19, 03/10/20 It is important to have your Hemoglobin A1C checked every 6 months if you are at goal and every 3 months if you are not at goal Goal completed 03/10/20    .  COMPLETED: Visit Primary Care Provider or Endocrinologist at least 2 times per year    On track     Primary care provider 09/04/19, 03/10/20 Annual Wellness visit 09/01/18 Please schedule your annual wellness visit Goal completed 03/10/20       Plan:  Send successful outreach letter with a copy of their individualized care plan and Send  individual care plan to provider   Client will be outreached by a [facility] [facility] RNCM in 6 months per tier level [facility] Case Management Team will follow member moving forward with assessments, care plans and documentation  [name] RN, BSN,CCM, [facility] [phone]

## 2020-03-21 DIAGNOSIS — M961 Postlaminectomy syndrome, not elsewhere classified: Secondary | ICD-10-CM | POA: Diagnosis not present

## 2020-03-21 DIAGNOSIS — M5416 Radiculopathy, lumbar region: Secondary | ICD-10-CM | POA: Diagnosis not present

## 2020-04-03 NOTE — Progress Notes (Signed)
Cardiology Office Note:    Date:  04/04/2020   ID:  Luis Lindsey, DOB 1950/11/15, MRN [id number]  PCP:  Aurea Graff.Luis Sa, MD  [place] Cardiologist:  No primary care provider on file.  [facility] Electrophysiologist:  None   Referring MD: Vista Lawman, MD    History of Present Illness:    Luis Lindsey is a 69 y.o. male with a hx of PAD s/p L SFA atherectomy with angioplasty, carotid artery disease s/p L CEA, HTN, HLD, and tobacco use who was referred by Dr. Bobby Rumpf for management moderate AS.  Patient states that he has been suffering from dizziness for the past several months. Previously was occurring daily and was mainly positional and sometimes exertional. Has associated nausea/vomiting. Had one episode of syncope at that time. Was seen by PCP and was noted to be anemic and relatively hypotensive with blood in his stool. He underwent colonoscopy and endoscopy which was reportedly normal. He was placed on iron supplementation with improvement. Last HgB 11.2.   While he continues to have some dizziness, it is much improved and now mainly occurs with position changes. MRI head without acute pathology and he has been followed by Neurology for symptoms.   Patient states his claudication is significantly improved since L SFA angioplasty. Able to walk 4-5 blocks before cramping which is significantly improved from prior. He is able to walk his dog without issues. No exertional chest pain. Has mild pedal edema but no orthopnea, PND. Denies any current blood in his stool or melena. He has noted that his exercise capacity has improved since the intervention for his PAD and since his dizziness has improved. No further episodes of syncope.  TTE 08/2019: Normal EF. Moderate aortic valve stenosis. Aortic valve area, by VTI measures 1.21 cm. Aortic valve mean gradient measures 14.5 mmHg. Aortic valve Vmax measures 2.76 m/s.   Past Medical History:  Diagnosis Date  . Anemia   . Aortic  stenosis   . Arthritis   . BCC (basal cell carcinoma)   . BPH (benign prostatic hyperplasia)   . CAROTID BRUIT, RIGHT 11/30/2009  . Dizziness   . DM 11/30/2009  . Dysphagia   . Esophageal dysmotility   . GERD (gastroesophageal reflux disease)   . Heart murmur   . History of hiatal hernia   . History of Holter monitoring 2009  . History of kidney stones   . HNP (herniated nucleus pulposus), lumbar    L4-5  . HYPERCHOLESTEROLEMIA 11/30/2009  . Hyperlipidemia   . HYPERTENSION 11/30/2009  . Kidney stones   . Numbness and tingling   . Peripheral vascular disease ([place])   . Right carotid bruit   . SMOKER 11/30/2009  . Stroke ([place]) 2012  . URINARY CALCULUS 11/30/2009    Past Surgical History:  Procedure Laterality Date  . ABDOMINAL AORTOGRAM W/LOWER EXTREMITY N/A 06/18/2018   Procedure: ABDOMINAL AORTOGRAM W/LOWER EXTREMITY;  Surgeon: Marty Heck, MD;  Location: [facility];  Service: Cardiovascular;  Laterality: N/A;  . ABDOMINAL AORTOGRAM W/LOWER EXTREMITY N/A 12/02/2019   Procedure: ABDOMINAL AORTOGRAM W/LOWER EXTREMITY;  Surgeon: Marty Heck, MD;  Location: [facility];  Service: Cardiovascular;  Laterality: N/A;  . arthroscopy of right shoulder Right    Jan 2019, June 2019-- done at [place], Dr. Ninfa Linden  . BACK SURGERY    . CATARACT EXTRACTION W/ INTRAOCULAR LENS  IMPLANT, BILATERAL    . CYSTOSCOPY    . ENDARTERECTOMY Right  08/24/2019   Procedure: ENDARTERECTOMY CAROTID;  Surgeon: Marty Heck, MD;  Location: [facility];  Service: Vascular;  Laterality: Right;  . FACIAL COSMETIC SURGERY  2014  . FINGER SURGERY  2002  . LUMBAR LAMINECTOMY/DECOMPRESSION MICRODISCECTOMY Left 08/01/2018   Procedure: Microdiscectomy - Lumbar four-Lumbar five - left;  Surgeon: Eustace Moore, MD;  Location: [facility];  Service: Neurosurgery;  Laterality: Left;  . LUMBAR LAMINECTOMY/DECOMPRESSION MICRODISCECTOMY Left 10/15/2018   Procedure: Re-do Microdiscectomy -  left - Lumbar four-Lumbar five;  Surgeon: Eustace Moore, MD;  Location: [facility];  Service: Neurosurgery;  Laterality: Left;  . NECK SURGERY  1986  . PERIPHERAL VASCULAR ATHERECTOMY  12/02/2019   Procedure: PERIPHERAL VASCULAR ATHERECTOMY;  Surgeon: Marty Heck, MD;  Location: [facility];  Service: Cardiovascular;;  left SFA  . PERIPHERAL VASCULAR INTERVENTION  06/18/2018   Procedure: PERIPHERAL VASCULAR INTERVENTION;  Surgeon: Marty Heck, MD;  Location: [facility];  Service: Cardiovascular;;  Left external iliac  . ROTATOR CUFF REPAIR Left   . UMBILICAL HERNIA REPAIR      Current Medications: Current Meds  Medication Sig  . amLODipine-benazepril (LOTREL) 10-40 MG per capsule Take 1 capsule by mouth daily.    [name] aspirin EC 81 MG EC tablet Take 1 tablet (81 mg total) by mouth daily at 6 (six) AM.  . clopidogrel (PLAVIX) 75 MG tablet Take 1 tablet (75 mg total) by mouth daily.  [name] gabapentin (NEURONTIN) 300 MG capsule Take 300 mg by mouth 3 (three) times daily.  . insulin degludec (TRESIBA FLEXTOUCH) 100 UNIT/ML FlexTouch Pen Inject 60 Units into the skin daily.  . metFORMIN (GLUCOPHAGE-XR) 500 MG 24 hr tablet Take 1,000 mg by mouth 2 (two) times daily.  Glory Rosebush VERIO test strip 1 each 3 (three) times daily.  . pantoprazole (PROTONIX) 40 MG tablet Take 40 mg by mouth daily with breakfast.   . pioglitazone (ACTOS) 45 MG tablet TAKE 1 TABLET DAILY (NEED APPOINTMENT FOR FURTHER REFILLS) (Patient taking differently: Take 45 mg by mouth daily. )  . repaglinide (PRANDIN) 2 MG tablet Take 2 mg by mouth 3 (three) times daily before meals.   . rosuvastatin (CRESTOR) 40 MG tablet Take 40 mg by mouth daily.   . [DISCONTINUED] hydrochlorothiazide (HYDRODIURIL) 25 MG tablet Take 25 mg by mouth daily.    . [DISCONTINUED] Insulin Glargine-Lixisenatide (SOLIQUA) 100-33 UNT-MCG/ML SOPN Inject into the skin.  . [DISCONTINUED] metoprolol succinate (TOPROL-XL) 50 MG 24 hr tablet  Take 50 mg by mouth daily. Take with or immediately following a meal.     Allergies:   Codeine   Social History   Socioeconomic History  . Marital status: Married    Spouse name: Luis Lindsey   . Number of children: 3  . Years of education: 2  . Highest education level: Not on file  Occupational History  . Occupation: Truck Education administrator: [employer]  Tobacco Use  . Smoking status: Former Smoker    Years: 40.00    Types: Cigarettes    Quit date: 12/2015    Years since quitting: 4.2  . Smokeless tobacco: Never Used  . Tobacco comment: Quit 12-2012  Vaping Use  . Vaping Use: Never used  Substance and Sexual Activity  . Alcohol use: No  . Drug use: No  . Sexual activity: Not on file  Other Topics Concern  . Not on file  Social History Narrative   . Patient drinks 2-3 cups of caffeine daily.  Patient lives at home with his wife Luis Lindsey.    Patient works at [employer].    Education. 12 th grade   Right handed               Social Determinants of Health   Financial Resource Strain:   . Difficulty of Paying Living Expenses: Not on file  Food Insecurity: No Food Insecurity  . Worried About Charity fundraiser in the Last Year: Never true  . Ran Out of Food in the Last Year: Never true  Transportation Needs: No Transportation Needs  . Lack of Transportation (Medical): No  . Lack of Transportation (Non-Medical): No  Physical Activity:   . Days of Exercise per Week: Not on file  . Minutes of Exercise per Session: Not on file  Stress:   . Feeling of Stress : Not on file  Social Connections:   . Frequency of Communication with Friends and Family: Not on file  . Frequency of Social Gatherings with Friends and Family: Not on file  . Attends Religious Services: Not on file  . Active Member of Clubs or Organizations: Not on file  . Attends Archivist Meetings: Not on file  . Marital Status: Not on file     Family History: The patient's family history includes CVA in  his father; Coronary artery disease in his brother, brother, father, and mother; Diabetes in his mother and sister; Diabetes Mellitus I in his mother and sister; Heart disease in his brother, father, and mother; Stroke in his father.  ROS:   Please see the history of present illness.    Review of Systems  Constitutional: Negative for chills, fever and weight loss.  HENT: Negative for hearing loss.   Eyes: Negative for blurred vision.  Respiratory: Negative for cough and shortness of breath.   Cardiovascular: Positive for claudication and leg swelling. Negative for chest pain, palpitations, orthopnea and PND.  Gastrointestinal: Negative for blood in stool, melena, nausea and vomiting.  Genitourinary: Negative for hematuria.  Musculoskeletal: Positive for myalgias.  Neurological: Positive for dizziness and loss of consciousness.  Psychiatric/Behavioral: Negative for substance abuse.    EKGs/Labs/Other Studies Reviewed:    The following studies were reviewed today: TTE 10/11/19: 1. Left ventricular ejection fraction, by estimation, is 65 to 70%. The  left ventricle has normal function. The left ventricle has no regional  wall motion abnormalities. There is moderate asymmetric left ventricular  hypertrophy of the basal-septal  segment. Left ventricular diastolic parameters are consistent with Grade I  diastolic dysfunction (impaired relaxation).  2. Right ventricular systolic function is normal. The right ventricular  size is normal.  3. Left atrial size was mildly dilated.  4. The mitral valve is grossly normal. Trivial mitral valve  regurgitation.  5. The aortic valve is tricuspid. Aortic valve regurgitation is not  visualized. Moderate aortic valve stenosis. Aortic valve area, by VTI  measures 1.21 cm. Aortic valve mean gradient measures 14.5 mmHg. Aortic  valve Vmax measures 2.76 m/s.  6. The inferior vena cava is normal in size with greater than 50%  respiratory  variability, suggesting right atrial pressure of 3 mmHg.  LLE ultrasound 12/2019 Summary:  Left: 50-74% stenosis noted in the deep femoral artery. 50-74% stenosis  noted in the proximal superficial femoral artery.   EKG:  NSR with 1 degree AVB. IVCD.  Recent Labs: 08/18/2019: ALT 28 12/09/2019: BUN 25; Creatinine, Ser 1.75; Hemoglobin 9.4; Platelets 346; Potassium 3.3; Sodium 138  Recent Lipid Panel No  results found for: CHOL, TRIG, HDL, CHOLHDL, VLDL, LDLCALC, LDLDIRECT    Physical Exam:    VS:  BP (!) 110/58   Pulse 63   Ht 5\' 10"  (1.778 m)   Wt 165 lb (74.8 kg)   SpO2 98%   BMI 23.68 kg/m     Wt Readings from Last 3 Encounters:  04/04/20 165 lb (74.8 kg)  01/13/20 166 lb 11.2 oz (75.6 kg)  12/31/19 174 lb (78.9 kg)     GEN:  Well nourished, well developed in no acute distress HEENT: Normal NECK: No JVD; No carotid bruits CARDIAC: RRR 3/6 crescendo-decrescendo systolic murmur that radiates to carotids. A2 preserved.  No rubs gallops. RESPIRATORY:  Clear to auscultation without rales, wheezing or rhonchi  ABDOMEN: Soft, non-tender, non-distended MUSCULOSKELETAL:  No edema; No deformity  SKIN: Warm and dry NEUROLOGIC:  Alert and oriented x 3 PSYCHIATRIC:  Normal affect   ASSESSMENT:    1. Moderate aortic stenosis   2. PVD (peripheral vascular disease) ([place])   3. Primary hypertension   4. Pure hypercholesterolemia   5. Lightheadedness   6. Stenosis of left carotid artery    PLAN:    In order of problems listed above:  #Lightheadedness: Patient with significant lightheadedness in the setting of anemia, soft blood pressures and moderate AS. Suspect orthostasis as underlying etiology given positional nature now. MRI head without acute pathology and symptoms overall improving.  -Stop his HCTZ 12.5mg  daily -Decrease metoprolol to 25mg  XL -Monitor BP at home and keep a log; will want to see what his blood pressures are running especially when lightheaded as may need  to down-titrate anti-HTN medications further -Continue iron supplementation; HgB improved and no current signs/symptoms of bleeding -Continue pantoprazole -Maintain good hydration -Compression socks -Follow-up with Neurology as scheduled  #Moderate AS: Normal EF. Moderate aortic valve stenosis. Aortic valve area, by VTI measures 1.21 cm. Aortic valve mean gradient measures 14.5 mmHg. Aortic valve Vmax measures 2.76 m/s. Discussed how this will likely progress meriting valve replacement in the future. Will continue to monitor with serial echoes as well as for development of symptoms. Do not suspect lightheadednes is due to the valve but can make orthostasis worse due to moderate obstruction of flow. -Monitor with serial TTEs; next in 2 years unless symptoms develop -Will likely progress to needing valve in future  #PAD s/p L SFA athertectomy and angioplasty Followed by vascular surgery. Claudication significantly improved since intervention. Able to walk his dog and be more active than in the past.  -Continue ASA and plavix -Continue PPI -Continue rosuvastatin  #Carotid artery stenosis s/p L CEA: -Continue rosuvastatin 40mg  daily -Continue ASA 81mg   #HTN: Afraid he is having low blood pressures at home with concern for orthostasis. -Stop HCTZ 12.5mg  -Decrease metop to 25mg  daily -Amlo-benzapril 10-40mg  daily -Monitor blood pressures at home and we can adjust his anti-hypertensives as needed  #HLD: Managed by PCP. Last LDL 71, HDL 43 -Rosuvastatin 40mg  daily   Shared Decision Making/Informed Consent     Medication Adjustments/Labs and Tests Ordered: Current medicines are reviewed at length with the patient today.  Concerns regarding medicines are outlined above.  No orders of the defined types were placed in this encounter.  Meds ordered this encounter  Medications  . metoprolol succinate (TOPROL XL) 25 MG 24 hr tablet    Sig: Take 1 tablet (25 mg total) by mouth daily.     Dispense:  90 tablet    Refill:  3    Dose  change    Patient Instructions  Medication Instructions:  1) DECREASE Metoprolol Succinate to 25mg  once daily  *If you need a refill on your cardiac medications before your next appointment, please call your pharmacy*   Lab Work: None If you have labs (blood work) drawn today and your tests are completely normal, you will receive your results only by: [name] MyChart Message (if you have MyChart) OR . A paper copy in the mail If you have any lab test that is abnormal or we need to change your treatment, we will call you to review the results.   Testing/Procedures: None   Follow-Up: At [facility], you and your health needs are our priority.  As part of our continuing mission to provide you with exceptional heart care, we have created designated Provider Care Teams.  These Care Teams include your primary Cardiologist (physician) and Advanced Practice Providers (APPs -  Physician Assistants and Nurse Practitioners) who all work together to provide you with the care you need, when you need it.  We recommend signing up for the patient portal called "MyChart".  Sign up information is provided on this After Visit Summary.  MyChart is used to connect with patients for Virtual Visits (Telemedicine).  Patients are able to view lab/test results, encounter notes, upcoming appointments, etc.  Non-urgent messages can be sent to your provider as well.   To learn more about what you can do with MyChart, go to [email or web address].    Your next appointment:   3 month(s)  The format for your next appointment:   In Person  Provider:   You may see Gwyndolyn Kaufman, MD or one of the following Advanced Practice Providers on your designated Care Team:    Richardson Dopp, PA-C  [place], [place]    Other Instructions    Stop HCTZ 12.5mg  daily  Signed, Freada Bergeron, MD  04/04/2020 5:02 PM    [place]

## 2020-04-04 ENCOUNTER — Ambulatory Visit: Payer: HMO | Admitting: Cardiology

## 2020-04-04 ENCOUNTER — Other Ambulatory Visit: Payer: Self-pay

## 2020-04-04 VITALS — BP 110/58 | HR 63 | Ht 70.0 in | Wt 165.0 lb

## 2020-04-04 DIAGNOSIS — E7800 Pure hypercholesterolemia, unspecified: Secondary | ICD-10-CM

## 2020-04-04 DIAGNOSIS — R42 Dizziness and giddiness: Secondary | ICD-10-CM | POA: Diagnosis not present

## 2020-04-04 DIAGNOSIS — I739 Peripheral vascular disease, unspecified: Secondary | ICD-10-CM

## 2020-04-04 DIAGNOSIS — I6522 Occlusion and stenosis of left carotid artery: Secondary | ICD-10-CM

## 2020-04-04 DIAGNOSIS — I10 Essential (primary) hypertension: Secondary | ICD-10-CM | POA: Diagnosis not present

## 2020-04-04 DIAGNOSIS — I350 Nonrheumatic aortic (valve) stenosis: Secondary | ICD-10-CM | POA: Diagnosis not present

## 2020-04-04 MED ORDER — METOPROLOL SUCCINATE ER 25 MG PO TB24
25.0000 mg | ORAL_TABLET | Freq: Every day | ORAL | 3 refills | Status: DC
Start: 2020-04-04 — End: 2020-06-29

## 2020-04-04 NOTE — Patient Instructions (Signed)
Medication Instructions:  1) DECREASE Metoprolol Succinate to 25mg  once daily  *If you need a refill on your cardiac medications before your next appointment, please call your pharmacy*   Lab Work: None If you have labs (blood work) drawn today and your tests are completely normal, you will receive your results only by: [name] MyChart Message (if you have MyChart) OR . A paper copy in the mail If you have any lab test that is abnormal or we need to change your treatment, we will call you to review the results.   Testing/Procedures: None   Follow-Up: At [facility], you and your health needs are our priority.  As part of our continuing mission to provide you with exceptional heart care, we have created designated Provider Care Teams.  These Care Teams include your primary Cardiologist (physician) and Advanced Practice Providers (APPs -  Physician Assistants and Nurse Practitioners) who all work together to provide you with the care you need, when you need it.  We recommend signing up for the patient portal called "MyChart".  Sign up information is provided on this After Visit Summary.  MyChart is used to connect with patients for Virtual Visits (Telemedicine).  Patients are able to view lab/test results, encounter notes, upcoming appointments, etc.  Non-urgent messages can be sent to your provider as well.   To learn more about what you can do with MyChart, go to [email or web address].    Your next appointment:   3 month(s)  The format for your next appointment:   In Person  Provider:   You may see Gwyndolyn Kaufman, MD or one of the following Advanced Practice Providers on your designated Care Team:    Richardson Dopp, PA-C  Robbie Lis, [place]    Other Instructions

## 2020-04-06 ENCOUNTER — Other Ambulatory Visit: Payer: Self-pay

## 2020-04-06 ENCOUNTER — Encounter: Payer: Self-pay | Admitting: Neurology

## 2020-04-06 ENCOUNTER — Ambulatory Visit: Payer: HMO | Admitting: Neurology

## 2020-04-06 VITALS — BP 130/60 | HR 67 | Ht 70.0 in | Wt 165.0 lb

## 2020-04-06 DIAGNOSIS — Z9889 Other specified postprocedural states: Secondary | ICD-10-CM

## 2020-04-06 DIAGNOSIS — I739 Peripheral vascular disease, unspecified: Secondary | ICD-10-CM | POA: Diagnosis not present

## 2020-04-06 DIAGNOSIS — I639 Cerebral infarction, unspecified: Secondary | ICD-10-CM | POA: Diagnosis not present

## 2020-04-06 NOTE — Progress Notes (Addendum)
HISTORY OF PRESENT ILLNESS: Luis Lindsey is a 69 years old male, seen in request by his primary care physician Dr. Donnie Coffin for evaluation of peripheral neuropathy, initial evaluation was on April 01, 2018.  I have reviewed and summarized the referring note from the referring physician.  He has past medical history of hypertension, diabetes 20 years, hyperlipidemia, previously heavy smoker of 1 pack a day for 40 years, stopped in 2017  Since 2017, he noticed numbness tingling burning sensation at the bottom of his feet, gradually work its way up now to bilateral ankle level, since 2019, he also noticed mild bilateral fingertips numbness tingling, his fingertip tends to turn to white, sharp burning pain when he grab grocery from freezer.  Since 2016, he also noticed left lower extremity radiating pain, initially it does not put significant limitation to his daily function, now has worsening radiating pain, especially when walking, now he cannot finish shopping at a grocery store, developed left calf deep achy pain after walking short distance.  He denies similar involvement of right lower extremity, denies bowel and bladder incontinence.  Electrodiagnostic study on February 21, 2018 showed evidence of bilateral axonal sensorimotor polyneuropathy, there is no evidence of active lumbosacral radiculopathy.  He also reported, if he does not have enough water intake, he noticed more left lower extremity pain, left foot swelling will decrease with increasing water intake  UPDATE Apr 29 2018: MRI of lumbar spine November 2019, L5-S1 large left paramedian disc herniation compressing the left S1 nerve roots within the left lateral recess,  X-ray of left hip in September 2019: Mild left hip joint degenerative changes.  UPDATE November 15 2018: Peripheral vascular disease, Dr. Carlis Abbott vein specialist note on Oct 21, 2018, he had left external iliac stent on June 18, 2018, for left lower  extremity lifestyle limiting claudications, also had left L4-5 hemi-laminectomy medial facetectomy foraminotomy followed by redo microdiscectomy L4-5 on the left side by Dr. Sherley Bounds on Oct 15, 2018.  He can walk much better, only has mild left low back pain.   UPDATE December 31 2019: Previously has significant left lower extremity swelling, pain with movement, left lower extremity vascular claudication is much improved since his left external iliac stent on June 18, 2018, is taking Plavix, and aspirin, he also had left lumbar decompression surgery of L4-5 by Dr. Sherley Bounds on Oct 15, 2018, his low back pain has much improved, is still seeing pain management at [facility]  Today he came in with new complaints of sudden onset dizziness, since April, most noticeable when he gets up, he felt woozy headed, taking a while to refocus, denies symptoms when sitting down, or lying down  He had a repeat ultrasound-guided access of right femoral artery angiogram, aortogram, bilateral lower extremity arteriogram, left superior femoral artery and above-knee popliteal artery mechanical orbital arthrectomy, angioplasty, drug-coated balloon on December 02, 2019  Personally reviewed CT angiogram of head and neck on August 19, 2019 showed atherosclerotic disease within aortic arch and major branches of the neck, mixed plaque within the proximal ICA, resolved focal high-grade stenosis of the proximal right ICA with radiographic straining sign, mixed plaque with high-grade stenosis at the origin of the left common carotid artery, predominantly calcified plaque at the carotid bifurcation and within the proximal ICA, up to 40% stenosis within the proximal ICA,  Right vertebral artery is dominant, calcified plaque at the right vertebral artery origin, with mild ICA stenosis, nondominant left vertebral artery, suspected  moderate ostial stenosis,   Is on aspirin, and Plavix double antiplatelet agent,  Presented  to emergency room on December 07, 2018, at he was bitten by his fully vaccinated dog to left hand,  ED again 2 days later on December 09, 2018, while he was redressing his wound on the left side, fell, had transient loss consciousness, hemoglobin was 9.4, dropped from his baseline of 12.9  Recheck by his primary care physician on December 14, 2019, hemoglobin 10.4, improved, B12 low normal at 241, ferritin was 24, he has more follow-up appointment pending with his primary care physician Dr. Donnie Coffin  UPDATE Apr 06 2020: He had asymptomatic right carotid artery stenosis, had right carotid endarterectomy in March 2029 by vascular surgeon Dr. Carlis Abbott, has continued follow-up, is still on aspirin and Plavix,  He was also seen by cardiologist Dr. 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   ALLERGIES: Allergies  Allergen Reactions  . Codeine Nausea Only    HOME MEDICATIONS: Outpatient Medications Prior to Visit  Medication Sig Dispense Refill  . amLODipine-benazepril (LOTREL) 10-40 MG per capsule Take 1 capsule by mouth daily.      [name] aspirin EC 81 MG EC tablet Take 1 tablet (81 mg total) by mouth daily at 6 (six) AM.    . clopidogrel (PLAVIX) 75 MG tablet Take 1 tablet (75 mg total) by mouth daily. 90 tablet 4  . insulin degludec (TRESIBA FLEXTOUCH) 100 UNIT/ML FlexTouch Pen Inject 60 Units into the skin daily.    . metFORMIN (GLUCOPHAGE-XR) 500 MG 24 hr tablet  Take 1,000 mg by mouth 2 (two) times daily.    . metoprolol succinate (TOPROL XL) 25 MG 24 hr tablet Take 1 tablet (25 mg total) by mouth daily. 90 tablet 3  . ONETOUCH VERIO test strip 1 each 3 (three) times daily.    . pantoprazole (PROTONIX) 40 MG tablet Take 40 mg by mouth daily with breakfast.     . pioglitazone (ACTOS) 45 MG tablet TAKE 1 TABLET DAILY (NEED APPOINTMENT FOR FURTHER REFILLS) (Patient taking differently: Take 45 mg by mouth daily. ) 90 tablet 0  . repaglinide (PRANDIN) 2 MG tablet Take 2 mg by mouth 3 (three) times daily before meals.     . rosuvastatin (CRESTOR) 40 MG tablet Take 40 mg by mouth daily.     [name] gabapentin (NEURONTIN) 300 MG capsule Take 300 mg by mouth 3 (three) times daily.     No facility-administered medications prior to visit.    PAST MEDICAL HISTORY: Past Medical History:  Diagnosis Date  . Anemia   . Aortic stenosis   . Arthritis   . BCC (basal cell carcinoma)   . BPH (benign prostatic hyperplasia)   . CAROTID BRUIT, RIGHT 11/30/2009  . Dizziness   . DM 11/30/2009  . Dysphagia   . Esophageal dysmotility   . GERD (gastroesophageal reflux disease)   . Heart murmur   . History of hiatal hernia   . History of Holter  monitoring 2009  . History of kidney stones   . HNP (herniated nucleus pulposus), lumbar    L4-5  . HYPERCHOLESTEROLEMIA 11/30/2009  . Hyperlipidemia   . HYPERTENSION 11/30/2009  . Kidney stones   . Numbness and tingling   . Peripheral vascular disease ([place])   . Right carotid bruit   . SMOKER 11/30/2009  . Stroke ([place]) 2012  . URINARY CALCULUS 11/30/2009    PAST SURGICAL HISTORY: Past Surgical History:  Procedure Laterality Date  . ABDOMINAL AORTOGRAM W/LOWER EXTREMITY N/A 06/18/2018   Procedure: ABDOMINAL AORTOGRAM W/LOWER EXTREMITY;  Surgeon: Marty Heck, MD;  Location: [facility];  Service: Cardiovascular;  Laterality: N/A;  . ABDOMINAL AORTOGRAM W/LOWER EXTREMITY N/A 12/02/2019   Procedure: ABDOMINAL AORTOGRAM  W/LOWER EXTREMITY;  Surgeon: Marty Heck, MD;  Location: [facility];  Service: Cardiovascular;  Laterality: N/A;  . arthroscopy of right shoulder Right    Jan 2019, June 2019-- done at [facility], Dr. Ninfa Linden  . BACK SURGERY    . CATARACT EXTRACTION W/ INTRAOCULAR LENS  IMPLANT, BILATERAL    . CYSTOSCOPY    . ENDARTERECTOMY Right 08/24/2019   Procedure: ENDARTERECTOMY CAROTID;  Surgeon: Marty Heck, MD;  Location: [facility];  Service: Vascular;  Laterality: Right;  . FACIAL COSMETIC SURGERY  2014  . FINGER SURGERY  2002  . LUMBAR LAMINECTOMY/DECOMPRESSION MICRODISCECTOMY Left 08/01/2018   Procedure: Microdiscectomy - Lumbar four-Lumbar five - left;  Surgeon: Eustace Moore, MD;  Location: [facility];  Service: Neurosurgery;  Laterality: Left;  . LUMBAR LAMINECTOMY/DECOMPRESSION MICRODISCECTOMY Left 10/15/2018   Procedure: Re-do Microdiscectomy - left - Lumbar four-Lumbar five;  Surgeon: Eustace Moore, MD;  Location: [facility];  Service: Neurosurgery;  Laterality: Left;  . NECK SURGERY  1986  . PERIPHERAL VASCULAR ATHERECTOMY  12/02/2019   Procedure: PERIPHERAL VASCULAR ATHERECTOMY;  Surgeon: Marty Heck, MD;  Location: [facility];  Service: Cardiovascular;;  left SFA  . PERIPHERAL VASCULAR INTERVENTION  06/18/2018   Procedure: PERIPHERAL VASCULAR INTERVENTION;  Surgeon: Marty Heck, MD;  Location: [facility];  Service: Cardiovascular;;  Left external iliac  . ROTATOR CUFF REPAIR Left   . UMBILICAL HERNIA REPAIR      FAMILY HISTORY: Family History  Problem Relation Age of Onset  . Diabetes Sister   . Diabetes Mellitus I Sister   . Stroke Father   . Heart disease Father   . CVA Father   . Coronary artery disease Father   . Diabetes Mother   . Heart disease Mother   . Diabetes Mellitus I Mother   . Coronary artery disease Mother   . Coronary artery disease Brother   . Heart disease Brother   . Coronary artery disease  Brother     SOCIAL HISTORY: Social History   Socioeconomic History  . Marital status: Married    Spouse name: [name]   . Number of children: 3  . Years of education: 17  . Highest education level: Not on file  Occupational History  . Occupation: Truck Education administrator: [employer]  Tobacco Use  . Smoking status: Former Smoker    Years: 40.00    Types: Cigarettes    Quit date: 12/2015    Years since quitting: 4.2  . Smokeless tobacco: Never Used  . Tobacco comment: Quit 12-2012  Vaping Use  . Vaping Use: Never used  Substance and Sexual Activity  . Alcohol use: No  . Drug  use: No  . Sexual activity: Not on file  Other Topics Concern  . Not on file  Social History Narrative   . Patient drinks 2-3 cups of caffeine daily.    Patient lives at home with his wife [name].    Patient works at [employer].    Education. 12 th grade   Right handed               Social Determinants of Health   Financial Resource Strain:   . Difficulty of Paying Living Expenses: Not on file  Food Insecurity: No Food Insecurity  . Worried About Charity fundraiser in the Last Year: Never true  . Ran Out of Food in the Last Year: Never true  Transportation Needs: No Transportation Needs  . Lack of Transportation (Medical): No  . Lack of Transportation (Non-Medical): No  Physical Activity:   . Days of Exercise per Week: Not on file  . Minutes of Exercise per Session: Not on file  Stress:   . Feeling of Stress : Not on file  Social Connections:   . Frequency of Communication with Friends and Family: Not on file  . Frequency of Social Gatherings with Friends and Family: Not on file  . Attends Religious Services: Not on file  . Active Member of Clubs or Organizations: Not on file  . Attends Archivist Meetings: Not on file  . Marital Status: Not on file  Intimate Partner Violence:   . Fear of Current or Ex-Partner: Not on file  . Emotionally Abused: Not on file  . Physically Abused: Not  on file  . Sexually Abused: Not on file   PHYSICAL EXAM  Vitals:   04/06/20 1503  Weight: 165 lb (74.8 kg)  Height: 5\' 10"  (1.778 m)   Body mass index is 23.68 kg/m.   PHYSICAL EXAMNIATION:  Gen: NAD, conversant, well nourised, well groomed  NEUROLOGICAL EXAM:  MENTAL STATUS: Speech/cognition: Awake alert oriented to history taking and casual conversation   CRANIAL NERVES: CN II: Visual fields are full to confrontation.  Pupils are round equal and briskly reactive to light. CN III, IV, VI: extraocular movement are normal. No ptosis. CN V: Facial sensation is intact to pinprick in all 3 divisions bilaterally. Corneal responses are intact.  CN VII: Face is symmetric with normal eye closure and smile. CN VIII: Hearing is normal to casual conversation CN IX, X: Palate elevates symmetrically. Phonation is normal. CN XI: Head turning and shoulder shrug are intact   MOTOR: Mild right hand posturing tremor, no weakness, no significant rigidity or bradykinesia  REFLEXES: Reflexes are 1 and symmetric at the biceps, triceps, knees, and absent at ankles. Plantar responses are flexor.  SENSORY: Intact vibratory sensation light touch at foot COORDINATION: Rapid alternating movements and fine finger movements are intact. There is no dysmetria on finger-to-nose and heel-knee-shin.    GAIT/STANCE: He needs push-up to get up from seated position, dragging left leg some, steady   DIAGNOSTIC DATA (LABS, IMAGING, TESTING) - I reviewed patient records, labs, notes, testing and imaging myself where available.  Lab Results  Component Value Date   WBC 9.8 12/09/2019   HGB 9.4 (L) 12/09/2019   HCT 29.2 (L) 12/09/2019   MCV 90.7 12/09/2019   PLT 346 12/09/2019      Component Value Date/Time   NA 138 12/09/2019 2030   K 3.3 (L) 12/09/2019 2030   CL 105 12/09/2019 2030   CO2 21 (L) 12/09/2019 2030  GLUCOSE 251 (H) 12/09/2019 2030   BUN 25 (H) 12/09/2019 2030   CREATININE 1.75  (H) 12/09/2019 2030   CALCIUM 9.1 12/09/2019 2030   PROT 7.9 08/18/2019 1001   ALBUMIN 3.9 08/18/2019 1001   AST 25 08/18/2019 1001   ALT 28 08/18/2019 1001   ALKPHOS 86 08/18/2019 1001   BILITOT 0.9 08/18/2019 1001   GFRNONAA 39 (L) 12/09/2019 2030   GFRAA 45 (L) 12/09/2019 2030   No results found for: CHOL, HDL, LDLCALC, LDLDIRECT, TRIG, CHOLHDL Lab Results  Component Value Date   HGBA1C 9.2 (H) 08/18/2019   No results found for: VITAMINB12 No results found for: TSH    ASSESSMENT AND PLAN 69 y.o. year old male   History of stroke,  Evidence of right parietal stroke, and stroke of right anterior basal ganglion, left central pons, bilateral thalamus  CT angiogram in March 2021 showed prominent mixed plaque within the proximal right ICA, focal high-grade stenosis of proximal right ICA with radiographic straining sign, mixed plaque with high-grade stenosis at the origin of left common carotid artery  Status post right carotid artery endarterectomy by vascular surgeon Dr. Carlis Abbott March 2021,  He is on aspirin and Plavix  Significant peripheral vascular disease  Status post left superficial femoral artery and above-knee popliteal artery mechanical arthrectomy and angioplasty on December 02, 2019    Anemia  Following his dog bite, he did reported to excessive bleeding taking aspirin and Plavix, repeat hemoglobin showed some improvement,  New onset dizziness  This happened in the setting of anemia, blood loss, moderate aortic stenosis, improved with adjustment of hypertensive medications, and better hydration,   Low back pain, radiating pain to left lower extremity,  History of lumbar decompression surgery, continue under pain management,    Marcial Pacas, M.D. Ph.D.  [facility] [place], [place] [address] Phone: [phone] Fax:      [phone]

## 2020-04-07 ENCOUNTER — Ambulatory Visit: Payer: Self-pay | Admitting: Neurology

## 2020-04-13 ENCOUNTER — Other Ambulatory Visit: Payer: Self-pay

## 2020-04-13 NOTE — Patient Outreach (Signed)
  [facility]) Care Management Chronic Special Needs Program    04/13/2020  Name: Luis Lindsey, DOB: 11/19/1950  MRN: [id number]   [name] is enrolled in a chronic special needs plan for Diabetes.  [facility] Care Management Team has assumed care and services for this member. Case closed by Morning Glory RN, [facility], [address] [facility] [phone]

## 2020-04-28 DIAGNOSIS — M5416 Radiculopathy, lumbar region: Secondary | ICD-10-CM | POA: Diagnosis not present

## 2020-05-16 DIAGNOSIS — M5416 Radiculopathy, lumbar region: Secondary | ICD-10-CM | POA: Diagnosis not present

## 2020-05-16 DIAGNOSIS — M961 Postlaminectomy syndrome, not elsewhere classified: Secondary | ICD-10-CM | POA: Diagnosis not present

## 2020-06-16 ENCOUNTER — Other Ambulatory Visit: Payer: Self-pay

## 2020-06-16 DIAGNOSIS — I739 Peripheral vascular disease, unspecified: Secondary | ICD-10-CM

## 2020-06-26 NOTE — Progress Notes (Signed)
Cardiology Office Note:    Date:  06/29/2020   ID:  Luis Lindsey, DOB 12-01-1950, MRN [id number]  PCP:  Aurea Graff.[name], MD  [place] Cardiologist:  No primary care provider on file.  [facility] Electrophysiologist:  None   Referring MD: Alroy Dust, [name], MD    History of Present Illness:    Luis Lindsey is a 70 y.o. male with a hx of PAD s/p L SFA atherectomy with angioplasty, carotid artery disease s/p L CEA, HTN, HLD, moderate AS, and tobacco use who returns to clinic for follow-up.  During last visit, the patient was complaining of lightheadedness in the setting of anemia, moderate AS, and relative hypotension. We stopped his HCTZ and decreased his metop to 25mg  XL daily. Since making those adjustments, the patient states that his symptoms have resolved.   Today, the patient states that he feels overall well. He has some shortness of breath with vacuuming the house after 27minutes, but states this is normal for him and has not been worsening. Was able to shovel during the snow storm and again had some mild SOB but no chest pain, lightheadedness, dizziness or syncope. He states that he had a sharp pain in the left side of his chest and right arm when laying down the other night to sleep. Discomfort lasted about 6-41minutes before resolving. Has not recurred. Did not occur with exertion. Blood pressures have been high at home (mainly 140-160s) with HR mainly in the 50s.  TTE 08/2019: Normal EF. Moderate aortic valve stenosis. Aortic valve area, by VTI measures 1.21 cm. Aortic valve mean gradient measures 14.5 mmHg. Aortic valve Vmax measures 2.76 m/s.   Past Medical History:  Diagnosis Date  . Anemia   . Aortic stenosis   . Arthritis   . BCC (basal cell carcinoma)   . BPH (benign prostatic hyperplasia)   . CAROTID BRUIT, RIGHT 11/30/2009  . Dizziness   . DM 11/30/2009  . Dysphagia   . Esophageal dysmotility   . GERD (gastroesophageal reflux disease)   . Heart murmur   .  History of hiatal hernia   . History of Holter monitoring 2009  . History of kidney stones   . HNP (herniated nucleus pulposus), lumbar    L4-5  . HYPERCHOLESTEROLEMIA 11/30/2009  . Hyperlipidemia   . HYPERTENSION 11/30/2009  . Kidney stones   . Numbness and tingling   . Peripheral vascular disease ([place])   . Right carotid bruit   . SMOKER 11/30/2009  . Stroke ([place]) 2012  . URINARY CALCULUS 11/30/2009    Past Surgical History:  Procedure Laterality Date  . ABDOMINAL AORTOGRAM W/LOWER EXTREMITY N/A 06/18/2018   Procedure: ABDOMINAL AORTOGRAM W/LOWER EXTREMITY;  Surgeon: Marty Heck, MD;  Location: [facility];  Service: Cardiovascular;  Laterality: N/A;  . ABDOMINAL AORTOGRAM W/LOWER EXTREMITY N/A 12/02/2019   Procedure: ABDOMINAL AORTOGRAM W/LOWER EXTREMITY;  Surgeon: Marty Heck, MD;  Location: [facility];  Service: Cardiovascular;  Laterality: N/A;  . arthroscopy of right shoulder Right    Jan 2019, June 2019-- done at [facility], Dr. Ninfa Linden  . BACK SURGERY    . CATARACT EXTRACTION W/ INTRAOCULAR LENS  IMPLANT, BILATERAL    . CYSTOSCOPY    . ENDARTERECTOMY Right 08/24/2019   Procedure: ENDARTERECTOMY CAROTID;  Surgeon: Marty Heck, MD;  Location: [facility];  Service: Vascular;  Laterality: Right;  . FACIAL COSMETIC SURGERY  2014  . FINGER SURGERY  2002  . LUMBAR  LAMINECTOMY/DECOMPRESSION MICRODISCECTOMY Left 08/01/2018   Procedure: Microdiscectomy - Lumbar four-Lumbar five - left;  Surgeon: Eustace [facility], MD;  Location: [facility];  Service: Neurosurgery;  Laterality: Left;  . LUMBAR LAMINECTOMY/DECOMPRESSION MICRODISCECTOMY Left 10/15/2018   Procedure: Re-do Microdiscectomy - left - Lumbar four-Lumbar five;  Surgeon: Eustace Moore, MD;  Location: [facility];  Service: Neurosurgery;  Laterality: Left;  . NECK SURGERY  1986  . PERIPHERAL VASCULAR ATHERECTOMY  12/02/2019   Procedure: PERIPHERAL VASCULAR ATHERECTOMY;  Surgeon: Marty Heck,  MD;  Location: [facility];  Service: Cardiovascular;;  left SFA  . PERIPHERAL VASCULAR INTERVENTION  06/18/2018   Procedure: PERIPHERAL VASCULAR INTERVENTION;  Surgeon: Marty Heck, MD;  Location: [facility];  Service: Cardiovascular;;  Left external iliac  . ROTATOR CUFF REPAIR Left   . UMBILICAL HERNIA REPAIR      Current Medications: Current Meds  Medication Sig  . amLODipine-benazepril (LOTREL) 10-40 MG per capsule Take 1 capsule by mouth daily.  [name] aspirin EC 81 MG EC tablet Take 1 tablet (81 mg total) by mouth daily at 6 (six) AM.  . carvedilol (COREG) 6.25 MG tablet Take 1 tablet (6.25 mg total) by mouth 2 (two) times daily.  . clopidogrel (PLAVIX) 75 MG tablet Take 1 tablet (75 mg total) by mouth daily.  . insulin degludec (TRESIBA FLEXTOUCH) 100 UNIT/ML FlexTouch Pen Inject 60 Units into the skin daily.  . metFORMIN (GLUCOPHAGE-XR) 500 MG 24 hr tablet Take 1,000 mg by mouth 2 (two) times daily.  Glory Rosebush VERIO test strip 1 each 3 (three) times daily.  . pantoprazole (PROTONIX) 40 MG tablet Take 40 mg by mouth daily with breakfast.   . pioglitazone (ACTOS) 45 MG tablet TAKE 1 TABLET DAILY (NEED APPOINTMENT FOR FURTHER REFILLS)  . repaglinide (PRANDIN) 2 MG tablet Take 2 mg by mouth 3 (three) times daily before meals.   . rosuvastatin (CRESTOR) 40 MG tablet Take 40 mg by mouth daily.   . [DISCONTINUED] metoprolol succinate (TOPROL XL) 25 MG 24 hr tablet Take 1 tablet (25 mg total) by mouth daily.     Allergies:   Codeine   Social History   Socioeconomic History  . Marital status: Married    Spouse name: [name]   . Number of children: 3  . Years of education: 4  . Highest education level: Not on file  Occupational History  . Occupation: Truck Education administrator: [employer]  Tobacco Use  . Smoking status: Former Smoker    Years: 40.00    Types: Cigarettes    Quit date: 12/2015    Years since quitting: 4.5  . Smokeless tobacco: Never Used  . Tobacco  comment: Quit 12-2012  Vaping Use  . Vaping Use: Never used  Substance and Sexual Activity  . Alcohol use: No  . Drug use: No  . Sexual activity: Not on file  Other Topics Concern  . Not on file  Social History Narrative   . Patient drinks 2-3 cups of caffeine daily.    Patient lives at home with his wife [name].    Patient works at [employer].    Education. 12 th grade   Right handed               Social Determinants of Health   Financial Resource Strain: Not on file  Food Insecurity: No Food Insecurity  . Worried About Charity fundraiser in the Last Year: Never true  . Ran Out  of Food in the Last Year: Never true  Transportation Needs: No Transportation Needs  . Lack of Transportation (Medical): No  . Lack of Transportation (Non-Medical): No  Physical Activity: Not on file  Stress: Not on file  Social Connections: Not on file     Family History: The patient's family history includes CVA in his father; Coronary artery disease in his brother, brother, father, and mother; Diabetes in his mother and sister; Diabetes Mellitus I in his mother and sister; Heart disease in his brother, father, and mother; Stroke in his father.  ROS:   Please see the history of present illness.    Review of Systems  Constitutional: Negative for chills and fever.  HENT: Negative for nosebleeds.   Eyes: Positive for redness.  Respiratory: Positive for shortness of breath.   Cardiovascular: Negative for chest pain, palpitations, orthopnea, claudication, leg swelling and PND.  Gastrointestinal: Negative for nausea and vomiting.  Genitourinary: Negative for hematuria.  Musculoskeletal: Negative for falls.  Neurological: Negative for dizziness and loss of consciousness.  Endo/Heme/Allergies: Negative for polydipsia.  Psychiatric/Behavioral: Negative for substance abuse.    EKGs/Labs/Other Studies Reviewed:    The following studies were reviewed today: TTE Sep 30, 2019: 1. Left ventricular ejection  fraction, by estimation, is 65 to 70%. The  left ventricle has normal function. The left ventricle has no regional  wall motion abnormalities. There is moderate asymmetric left ventricular  hypertrophy of the basal-septal  segment. Left ventricular diastolic parameters are consistent with Grade I  diastolic dysfunction (impaired relaxation).  2. Right ventricular systolic function is normal. The right ventricular  size is normal.  3. Left atrial size was mildly dilated.  4. The mitral valve is grossly normal. Trivial mitral valve  regurgitation.  5. The aortic valve is tricuspid. Aortic valve regurgitation is not  visualized. Moderate aortic valve stenosis. Aortic valve area, by VTI  measures 1.21 cm. Aortic valve mean gradient measures 14.5 mmHg. Aortic  valve Vmax measures 2.76 m/s.  6. The inferior vena cava is normal in size with greater than 50%  respiratory variability, suggesting right atrial pressure of 3 mmHg.  LLE ultrasound 12/2019 Summary:  Left: 50-74% stenosis noted in the deep femoral artery. 50-74% stenosis  noted in the proximal superficial femoral artery.    Recent Labs: 08/18/2019: ALT 28 12/09/2019: BUN 25; Creatinine, Ser 1.75; Hemoglobin 9.4; Platelets 346; Potassium 3.3; Sodium 138  Recent Lipid Panel No results found for: CHOL, TRIG, HDL, CHOLHDL, VLDL, LDLCALC, LDLDIRECT   Physical Exam:    VS:  BP (!) 150/80   Pulse 67   Ht 5\' 10"  (1.778 m)   Wt 170 lb 9.6 oz (77.4 kg)   SpO2 99%   BMI 24.48 kg/m     Wt Readings from Last 3 Encounters:  06/29/20 170 lb 9.6 oz (77.4 kg)  04/06/20 165 lb (74.8 kg)  04/04/20 165 lb (74.8 kg)     GEN:  Well nourished, well developed in no acute distress HEENT: Normal NECK: No JVD; no carotid bruits CARDIAC: RRR, 2/6 holosystolic, harsh murmur. No rubs, gallops RESPIRATORY:  Clear to auscultation without rales, wheezing or rhonchi  ABDOMEN: Soft, non-tender, non-distended MUSCULOSKELETAL:  No edema; No  deformity  SKIN: Warm and dry NEUROLOGIC:  Alert and oriented x 3 PSYCHIATRIC:  Normal affect   ASSESSMENT:    1. Moderate aortic stenosis   2. Primary hypertension   3. PVD (peripheral vascular disease) ([place])   4. Lightheadedness   5. Bilateral carotid bruits  6. Pure hypercholesterolemia   7. Chest pain of uncertain etiology    PLAN:    In order of problems listed above:  #Lightheadedness: Resolved with stopping HCTZ. Likely orthostatic in the setting of anemia, soft blood pressures and moderate AS. MRI head without acute pathology. -Stopped HCTZ 12.5mg  daily -Continue iron supplementation; HgB improved and no current signs/symptoms of bleeding -Continue pantoprazole -Maintain good hydration -Compression socks -Follow-up with Neurology as scheduled  #Moderate AS: Normal EF. Moderate aortic valve stenosis. Aortic valve area, by VTI measures 1.21 cm. Aortic valve mean gradient measures 14.5 mmHg. Aortic valve Vmax measures 2.76 m/s. Discussed how this will likely progress meriting valve replacement in the future. Will continue to monitor with serial echoes as well as for development of symptoms.  -Repeat TTE in 09/2020 to monitor valve and degree of progression -Will likely progress to needing valve in future--discussed with the patient during our visit  #PAD s/p L SFA athertectomy and angioplasty Followed by vascular surgery. Claudication significantly improved since intervention. Able to walk his dog and be more active than in the past.  -Continue ASA and plavix -Continue PPI -Continue rosuvastatin  #Carotid artery stenosis s/p L CEA: -Continue rosuvastatin 40mg  daily -Continue ASA 81mg   #HTN: Has been elevated at home since stopping HCTZ and decreasing metop.  -Change metop to core 6.25mg  BID for better blood pressure control -Continue Amlo-benzapril 10-40mg  daily -Monitor blood pressures at home and we can adjust his anti-hypertensives as  needed  #HLD: Managed by PCP. Last LDL 71, HDL 43 -Rosuvastatin 40mg  daily  #Episode of Chest Pain: Atypical and sharp in nature that occurred at rest. Has not recurred. No exertional symptoms. Low suspicion of cardiac etiology, but will continue to monitor closely and if recurs, can pursue stress testing at that time. -Continue close monitoring    Medication Adjustments/Labs and Tests Ordered: Current medicines are reviewed at length with the patient today.  Concerns regarding medicines are outlined above.  Orders Placed This Encounter  Procedures  . ECHOCARDIOGRAM COMPLETE   Meds ordered this encounter  Medications  . carvedilol (COREG) 6.25 MG tablet    Sig: Take 1 tablet (6.25 mg total) by mouth 2 (two) times daily.    Dispense:  180 tablet    Refill:  3    Patient Instructions   Medication Instructions:  Your physician has recommended you make the following change in your medication:  1. STOP METOPROLOL  2. START COREG 6.25 MG TWICE DAILY  *If you need a refill on your cardiac medications before your next appointment, please call your pharmacy*   Lab Work: NONE If you have labs (blood work) drawn today and your tests are completely normal, you will receive your results only by: [name] MyChart Message (if you have MyChart) OR . A paper copy in the mail If you have any lab test that is abnormal or we need to change your treatment, we will call you to review the results.   Testing/Procedures: Your physician has requested that you have an echocardiogram. Echocardiography is a painless test that uses sound waves to create images of your heart. It provides your doctor with information about the size and shape of your heart and how well your heart's chambers and valves are working. This procedure takes approximately one hour. There are no restrictions for this procedure.   Follow-Up: At [facility], you and your health needs are our priority.  As part of our continuing  mission to provide you with exceptional heart care,  we have created designated Provider Care Teams.  These Care Teams include your primary Cardiologist (physician) and Advanced Practice Providers (APPs -  Physician Assistants and Nurse Practitioners) who all work together to provide you with the care you need, when you need it.  We recommend signing up for the patient portal called "MyChart".  Sign up information is provided on this After Visit Summary.  MyChart is used to connect with patients for Virtual Visits (Telemedicine).  Patients are able to view lab/test results, encounter notes, upcoming appointments, etc.  Non-urgent messages can be sent to your provider as well.   To learn more about what you can do with MyChart, go to [email or web address].    Your next appointment:   6 month(s)  The format for your next appointment:   In Person  Provider:   You may see DR. Arminta Gamm or one of the following Advanced Practice Providers on your designated Care Team:    Richardson Dopp, PA-C  [place] [place], [place]    Other Instructions  Echocardiogram An echocardiogram is a test that uses sound waves (ultrasound) to produce images of the heart. Images from an echocardiogram can provide important information about:  Heart size and shape.  The size and thickness and movement of your heart's walls.  Heart muscle function and strength.  Heart valve function or if you have stenosis. Stenosis is when the heart valves are too narrow.  If blood is flowing backward through the heart valves (regurgitation).  A tumor or infectious growth around the heart valves.  Areas of heart muscle that are not working well because of poor blood flow or injury from a heart attack.  Aneurysm detection. An aneurysm is a weak or damaged part of an artery wall. The wall bulges out from the normal force of blood pumping through the body. Tell a health care provider about:  Any allergies you have.  All medicines  you are taking, including vitamins, herbs, eye drops, creams, and over-the-counter medicines.  Any blood disorders you have.  Any surgeries you have had.  Any medical conditions you have.  Whether you are pregnant or may be pregnant. What are the risks? Generally, this is a safe test. However, problems may occur, including an allergic reaction to dye (contrast) that may be used during the test. What happens before the test? No specific preparation is needed. You may eat and drink normally. What happens during the test?  You will take off your clothes from the waist up and put on a hospital gown.  Electrodes or electrocardiogram (ECG)patches may be placed on your chest. The electrodes or patches are then connected to a device that monitors your heart rate and rhythm.  You will lie down on a table for an ultrasound exam. A gel will be applied to your chest to help sound waves pass through your skin.  A handheld device, called a transducer, will be pressed against your chest and moved over your heart. The transducer produces sound waves that travel to your heart and bounce back (or "echo" back) to the transducer. These sound waves will be captured in real-time and changed into images of your heart that can be viewed on a video monitor. The images will be recorded on a computer and reviewed by your health care provider.  You may be asked to change positions or hold your breath for a short time. This makes it easier to get different views or better views of your heart.  In some cases, you may  receive contrast through an IV in one of your veins. This can improve the quality of the pictures from your heart. The procedure may vary among health care providers and hospitals.   What can I expect after the test? You may return to your normal, everyday life, including diet, activities, and medicines, unless your health care provider tells you not to do that. Follow these instructions at home:  It  is up to you to get the results of your test. Ask your health care provider, or the department that is doing the test, when your results will be ready.  Keep all follow-up visits. This is important. Summary  An echocardiogram is a test that uses sound waves (ultrasound) to produce images of the heart.  Images from an echocardiogram can provide important information about the size and shape of your heart, heart muscle function, heart valve function, and other possible heart problems.  You do not need to do anything to prepare before this test. You may eat and drink normally.  After the echocardiogram is completed, you may return to your normal, everyday life, unless your health care provider tells you not to do that. This information is not intended to replace advice given to you by your health care provider. Make sure you discuss any questions you have with your health care provider. Document Revised: 01/05/2020 Document Reviewed: 01/05/2020 Elsevier Patient Education  2021 [employer].      Signed, Freada Bergeron, MD  06/29/2020 8:35 AM    [place]

## 2020-06-29 ENCOUNTER — Encounter: Payer: Self-pay | Admitting: Cardiology

## 2020-06-29 ENCOUNTER — Other Ambulatory Visit: Payer: Self-pay

## 2020-06-29 ENCOUNTER — Ambulatory Visit (INDEPENDENT_AMBULATORY_CARE_PROVIDER_SITE_OTHER): Payer: HMO | Admitting: Cardiology

## 2020-06-29 VITALS — BP 150/80 | HR 67 | Ht 70.0 in | Wt 170.6 lb

## 2020-06-29 DIAGNOSIS — E7800 Pure hypercholesterolemia, unspecified: Secondary | ICD-10-CM | POA: Diagnosis not present

## 2020-06-29 DIAGNOSIS — R079 Chest pain, unspecified: Secondary | ICD-10-CM | POA: Diagnosis not present

## 2020-06-29 DIAGNOSIS — R0989 Other specified symptoms and signs involving the circulatory and respiratory systems: Secondary | ICD-10-CM

## 2020-06-29 DIAGNOSIS — I10 Essential (primary) hypertension: Secondary | ICD-10-CM | POA: Diagnosis not present

## 2020-06-29 DIAGNOSIS — I350 Nonrheumatic aortic (valve) stenosis: Secondary | ICD-10-CM

## 2020-06-29 DIAGNOSIS — R42 Dizziness and giddiness: Secondary | ICD-10-CM

## 2020-06-29 DIAGNOSIS — I739 Peripheral vascular disease, unspecified: Secondary | ICD-10-CM | POA: Diagnosis not present

## 2020-06-29 MED ORDER — CARVEDILOL 6.25 MG PO TABS
6.2500 mg | ORAL_TABLET | Freq: Two times a day (BID) | ORAL | 3 refills | Status: DC
Start: 2020-06-29 — End: 2021-08-28

## 2020-06-29 NOTE — Patient Instructions (Addendum)
Medication Instructions:  Your physician has recommended you make the following change in your medication:  1. STOP METOPROLOL  2. START COREG 6.25 MG TWICE DAILY  *If you need a refill on your cardiac medications before your next appointment, please call your pharmacy*   Lab Work: NONE If you have labs (blood work) drawn today and your tests are completely normal, you will receive your results only by: [name] MyChart Message (if you have MyChart) OR . A paper copy in the mail If you have any lab test that is abnormal or we need to change your treatment, we will call you to review the results.   Testing/Procedures: Your physician has requested that you have an echocardiogram. Echocardiography is a painless test that uses sound waves to create images of your heart. It provides your doctor with information about the size and shape of your heart and how well your heart's chambers and valves are working. This procedure takes approximately one hour. There are no restrictions for this procedure.   Follow-Up: At [facility] you and your health needs are our priority.  As part of our continuing mission to provide you with exceptional heart care, we have created designated Provider Care Teams.  These Care Teams include your primary Cardiologist (physician) and Advanced Practice Providers (APPs -  Physician Assistants and Nurse Practitioners) who all work together to provide you with the care you need, when you need it.  We recommend signing up for the patient portal called "MyChart".  Sign up information is provided on this After Visit Summary.  MyChart is used to connect with patients for Virtual Visits (Telemedicine).  Patients are able to view lab/test results, encounter notes, upcoming appointments, etc.  Non-urgent messages can be sent to your provider as well.   To learn more about what you can do with MyChart, go to [email or web address].    Your next appointment:   6 month(s)  The  format for your next appointment:   In Person  Provider:   You may see DR. PEMBERTON or one of the following Advanced Practice Providers on your designated Care Team:    Richardson Dopp, PA-C  Vin Reddick, [place]    Other Instructions  Echocardiogram An echocardiogram is a test that uses sound waves (ultrasound) to produce images of the heart. Images from an echocardiogram can provide important information about:  Heart size and shape.  The size and thickness and movement of your heart's walls.  Heart muscle function and strength.  Heart valve function or if you have stenosis. Stenosis is when the heart valves are too narrow.  If blood is flowing backward through the heart valves (regurgitation).  A tumor or infectious growth around the heart valves.  Areas of heart muscle that are not working well because of poor blood flow or injury from a heart attack.  Aneurysm detection. An aneurysm is a weak or damaged part of an artery wall. The wall bulges out from the normal force of blood pumping through the body. Tell a health care provider about:  Any allergies you have.  All medicines you are taking, including vitamins, herbs, eye drops, creams, and over-the-counter medicines.  Any blood disorders you have.  Any surgeries you have had.  Any medical conditions you have.  Whether you are pregnant or may be pregnant. What are the risks? Generally, this is a safe test. However, problems may occur, including an allergic reaction to dye (contrast) that may be used during the test. What happens before  the test? No specific preparation is needed. You may eat and drink normally. What happens during the test?  You will take off your clothes from the waist up and put on a hospital gown.  Electrodes or electrocardiogram (ECG)patches may be placed on your chest. The electrodes or patches are then connected to a device that monitors your heart rate and rhythm.  You will lie down on a  table for an ultrasound exam. A gel will be applied to your chest to help sound waves pass through your skin.  A handheld device, called a transducer, will be pressed against your chest and moved over your heart. The transducer produces sound waves that travel to your heart and bounce back (or "echo" back) to the transducer. These sound waves will be captured in real-time and changed into images of your heart that can be viewed on a video monitor. The images will be recorded on a computer and reviewed by your health care provider.  You may be asked to change positions or hold your breath for a short time. This makes it easier to get different views or better views of your heart.  In some cases, you may receive contrast through an IV in one of your veins. This can improve the quality of the pictures from your heart. The procedure may vary among health care providers and hospitals.   What can I expect after the test? You may return to your normal, everyday life, including diet, activities, and medicines, unless your health care provider tells you not to do that. Follow these instructions at home:  It is up to you to get the results of your test. Ask your health care provider, or the department that is doing the test, when your results will be ready.  Keep all follow-up visits. This is important. Summary  An echocardiogram is a test that uses sound waves (ultrasound) to produce images of the heart.  Images from an echocardiogram can provide important information about the size and shape of your heart, heart muscle function, heart valve function, and other possible heart problems.  You do not need to do anything to prepare before this test. You may eat and drink normally.  After the echocardiogram is completed, you may return to your normal, everyday life, unless your health care provider tells you not to do that. This information is not intended to replace advice given to you by your health care  provider. Make sure you discuss any questions you have with your health care provider. Document Revised: 01/05/2020 Document Reviewed: 01/05/2020 Elsevier Patient Education  2021 [employer].

## 2020-07-12 ENCOUNTER — Ambulatory Visit (INDEPENDENT_AMBULATORY_CARE_PROVIDER_SITE_OTHER)
Admission: RE | Admit: 2020-07-12 | Discharge: 2020-07-12 | Disposition: A | Discharge status: Home / Self Care | Payer: HMO | Source: Ambulatory Visit | Attending: Vascular Surgery | Admitting: Vascular Surgery

## 2020-07-12 ENCOUNTER — Ambulatory Visit: Payer: HMO | Admitting: Physician Assistant

## 2020-07-12 ENCOUNTER — Ambulatory Visit (HOSPITAL_COMMUNITY)
Admission: RE | Admit: 2020-07-12 | Discharge: 2020-07-12 | Disposition: A | Discharge status: Home / Self Care | Payer: HMO | Source: Ambulatory Visit | Attending: Vascular Surgery | Admitting: Vascular Surgery

## 2020-07-12 ENCOUNTER — Other Ambulatory Visit: Payer: Self-pay

## 2020-07-12 VITALS — BP 147/63 | HR 61 | Temp 98.1°F | Resp 20 | Ht 70.0 in | Wt 170.7 lb

## 2020-07-12 DIAGNOSIS — I739 Peripheral vascular disease, unspecified: Secondary | ICD-10-CM

## 2020-07-12 DIAGNOSIS — I6523 Occlusion and stenosis of bilateral carotid arteries: Secondary | ICD-10-CM

## 2020-07-12 NOTE — Progress Notes (Signed)
History of Present Illness:  Patient is a 70 y.o. year old male who presents for evaluation of claudication and carotid stenosis.  He is s/p atherectomy with angioplasty of left SFA and above-knee popliteal artery on December 02, 2019.  He previously had a left external iliac stent for left lower extremity claudication. He also under went left carotid endarterectomy in March  by Dr. Carlis Abbott.  He is here today for f/u and yearly surveillance for Carotid and LE PAD.  He states he is doing well over all.  He denise calf pain in the LE, non healing wounds and or symptoms of stroke to include amaurosis, weakness, or aphasia.    He is on Plavix, aspirin and statin daily.    Past Medical History:  Diagnosis Date  . Anemia   . Aortic stenosis   . Arthritis   . BCC (basal cell carcinoma)   . BPH (benign prostatic hyperplasia)   . CAROTID BRUIT, RIGHT 11/30/2009  . Dizziness   . DM 11/30/2009  . Dysphagia   . Esophageal dysmotility   . GERD (gastroesophageal reflux disease)   . Heart murmur   . History of hiatal hernia   . History of Holter monitoring 2009  . History of kidney stones   . HNP (herniated nucleus pulposus), lumbar    L4-5  . HYPERCHOLESTEROLEMIA 11/30/2009  . Hyperlipidemia   . HYPERTENSION 11/30/2009  . Kidney stones   . Numbness and tingling   . Peripheral vascular disease ([place])   . Right carotid bruit   . SMOKER 11/30/2009  . Stroke ([place]) 2012  . URINARY CALCULUS 11/30/2009    Past Surgical History:  Procedure Laterality Date  . ABDOMINAL AORTOGRAM W/LOWER EXTREMITY N/A 06/18/2018   Procedure: ABDOMINAL AORTOGRAM W/LOWER EXTREMITY;  Surgeon: Marty Heck, MD;  Location: [place] [facility];  Service: Cardiovascular;  Laterality: N/A;  . ABDOMINAL AORTOGRAM W/LOWER EXTREMITY N/A 12/02/2019   Procedure: ABDOMINAL AORTOGRAM W/LOWER EXTREMITY;  Surgeon: Marty Heck, MD;  Location: [facility] [facility];  Service: Cardiovascular;  Laterality: N/A;  . arthroscopy of right  shoulder Right    Jan 2019, June 2019-- done at [place], Dr. Ninfa Linden  . BACK SURGERY    . CATARACT EXTRACTION W/ INTRAOCULAR LENS  IMPLANT, BILATERAL    . CYSTOSCOPY    . ENDARTERECTOMY Right 08/24/2019   Procedure: ENDARTERECTOMY CAROTID;  Surgeon: Marty Heck, MD;  Location: [facility];  Service: Vascular;  Laterality: Right;  . FACIAL COSMETIC SURGERY  2014  . FINGER SURGERY  2002  . LUMBAR LAMINECTOMY/DECOMPRESSION MICRODISCECTOMY Left 08/01/2018   Procedure: Microdiscectomy - Lumbar four-Lumbar five - left;  Surgeon: Eustace Moore, MD;  Location: [facility];  Service: Neurosurgery;  Laterality: Left;  . LUMBAR LAMINECTOMY/DECOMPRESSION MICRODISCECTOMY Left 10/15/2018   Procedure: Re-do Microdiscectomy - left - Lumbar four-Lumbar five;  Surgeon: Eustace Moore, MD;  Location: [place];  Service: Neurosurgery;  Laterality: Left;  . NECK SURGERY  1986  . PERIPHERAL VASCULAR ATHERECTOMY  12/02/2019   Procedure: PERIPHERAL VASCULAR ATHERECTOMY;  Surgeon: Marty Heck, MD;  Location: [facility];  Service: Cardiovascular;;  left SFA  . PERIPHERAL VASCULAR INTERVENTION  06/18/2018   Procedure: PERIPHERAL VASCULAR INTERVENTION;  Surgeon: Marty Heck, MD;  Location: [facility];  Service: Cardiovascular;;  Left external iliac  . ROTATOR CUFF REPAIR Left   . UMBILICAL HERNIA REPAIR      ROS:   General:  No  weight loss, Fever, chills  HEENT: No recent headaches, no nasal bleeding, no visual changes, no sore throat  Neurologic: No dizziness, blackouts, seizures. No recent symptoms of stroke or mini- stroke. No recent episodes of slurred speech, or temporary blindness.  Cardiac: No recent episodes of chest pain/pressure, no shortness of breath at rest.  No shortness of breath with exertion.  Denies history of atrial fibrillation or irregular heartbeat  Vascular: No history of rest pain in feet.  No history of claudication.  No history of non-healing  ulcer, No history of DVT   Pulmonary: No home oxygen, no productive cough, no hemoptysis,  No asthma or wheezing  Musculoskeletal:  [ ]  Arthritis, [ ]  Low back pain,  [ ]  Joint pain  Hematologic:No history of hypercoagulable state.  No history of easy bleeding.  No history of anemia  Gastrointestinal: No hematochezia or melena,  No gastroesophageal reflux, no trouble swallowing  Urinary: [ ]  chronic Kidney disease, [ ]  on HD - [ ]  MWF or [ ]  TTHS, [ ]  Burning with urination, [ ]  Frequent urination, [ ]  Difficulty urinating;   Skin: No rashes  Psychological: No history of anxiety,  No history of depression  Social History Social History   Tobacco Use  . Smoking status: Former Smoker    Years: 40.00    Types: Cigarettes    Quit date: 12/2015    Years since quitting: 4.5  . Smokeless tobacco: Never Used  . Tobacco comment: Quit 12-2012  Vaping Use  . Vaping Use: Never used  Substance Use Topics  . Alcohol use: No  . Drug use: No    Family History Family History  Problem Relation Age of Onset  . Diabetes Sister   . Diabetes Mellitus I Sister   . Stroke Father   . Heart disease Father   . CVA Father   . Coronary artery disease Father   . Diabetes Mother   . Heart disease Mother   . Diabetes Mellitus I Mother   . Coronary artery disease Mother   . Coronary artery disease Brother   . Heart disease Brother   . Coronary artery disease Brother     Allergies  Allergies  Allergen Reactions  . Codeine Nausea Only     Current Outpatient Medications  Medication Sig Dispense Refill  . amLODipine-benazepril (LOTREL) 10-40 MG per capsule Take 1 capsule by mouth daily.    [name] aspirin EC 81 MG EC tablet Take 1 tablet (81 mg total) by mouth daily at 6 (six) AM.    . carvedilol (COREG) 6.25 MG tablet Take 1 tablet (6.25 mg total) by mouth 2 (two) times daily. 180 tablet 3  . clopidogrel (PLAVIX) 75 MG tablet Take 1 tablet (75 mg total) by mouth daily. 90 tablet 4  . insulin  degludec (TRESIBA FLEXTOUCH) 100 UNIT/ML FlexTouch Pen Inject 60 Units into the skin daily.    . metFORMIN (GLUCOPHAGE-XR) 500 MG 24 hr tablet Take 1,000 mg by mouth 2 (two) times daily.    Glory Rosebush VERIO test strip 1 each 3 (three) times daily.    . pantoprazole (PROTONIX) 40 MG tablet Take 40 mg by mouth daily with breakfast.     . pioglitazone (ACTOS) 45 MG tablet TAKE 1 TABLET DAILY (NEED APPOINTMENT FOR FURTHER REFILLS) 90 tablet 0  . repaglinide (PRANDIN) 2 MG tablet Take 2 mg by mouth 3 (three) times daily before meals.     . rosuvastatin (CRESTOR) 40 MG tablet Take 40 mg  by mouth daily.      No current facility-administered medications for this visit.    Physical Examination  Vitals:   07/12/20 1040 07/12/20 1045  BP: (!) 155/61 (!) 147/63  Pulse: 61   Resp: 20   Temp: 98.1 F (36.7 C)   TempSrc: Temporal   SpO2: 100%   Weight: 170 lb 11.2 oz (77.4 kg)   Height: 5\' 10"  (1.778 m)     Body mass index is 24.49 kg/m.  General:  Alert and oriented, no acute distress HEENT: Normal Neck: No bruit or JVD Pulmonary: Clear to auscultation bilaterally Cardiac: Regular Rate and Rhythm without murmur Abdomen: Soft, non-tender, non-distended, no mass, no scars Skin: No rash Extremity Pulses:  2+ radial, femoral, right dorsalis pedis pulses bilaterally B feet warm and well perfused without signs of ischemia.   Musculoskeletal: No deformity or edema  Neurologic: Upper and lower extremity motor 5/5 and symmetric  DATA:    Right Carotid Findings:  +----------+--------+--------+--------+------------------+--------+       PSV cm/sEDV cm/sStenosisPlaque DescriptionComments  +----------+--------+--------+--------+------------------+--------+  CCA Prox 96   7                       +----------+--------+--------+--------+------------------+--------+  CCA Mid  167   21   >50%                  +----------+--------+--------+--------+------------------+--------+  CCA Distal172   21                      +----------+--------+--------+--------+------------------+--------+  ICA Prox 96   20   1-39%                 +----------+--------+--------+--------+------------------+--------+  ICA Mid  116   27                      +----------+--------+--------+--------+------------------+--------+  ICA Distal115   25                      +----------+--------+--------+--------+------------------+--------+  ECA    199   21                      +----------+--------+--------+--------+------------------+--------+   +----------+--------+-------+----------------+-------------------+       PSV cm/sEDV cmsDescribe    Arm Pressure (mmHG)  +----------+--------+-------+----------------+-------------------+  [id number]       Multiphasic, WNL            +----------+--------+-------+----------------+-------------------+   +---------+--------+--+--------+--+---------+  VertebralPSV cm/s89EDV cm/s24Antegrade  +---------+--------+--+--------+--+---------+      Left Carotid Findings:  +----------+--------+--------+--------+------------------+--------+       PSV cm/sEDV cm/sStenosisPlaque DescriptionComments  +----------+--------+--------+--------+------------------+--------+  CCA Prox 77   15                      +----------+--------+--------+--------+------------------+--------+  CCA Distal85   20                      +----------+--------+--------+--------+------------------+--------+  ICA Prox 106   29   1-39%                 +----------+--------+--------+--------+------------------+--------+  ICA Mid  125    35                      +----------+--------+--------+--------+------------------+--------+  ICA Distal94   21                      +----------+--------+--------+--------+------------------+--------+  ECA    90   9                       +----------+--------+--------+--------+------------------+--------+   +----------+--------+--------+----------------+-------------------+       PSV cm/sEDV cm/sDescribe    Arm Pressure (mmHG)  +----------+--------+--------+----------------+-------------------+  [id number]       Multiphasic, WNL            +----------+--------+--------+----------------+-------------------+   +---------+--------+---+--------+--+---------+  VertebralPSV cm/s102EDV cm/s16Antegrade  +---------+--------+---+--------+--+---------+     Summary:  Right Carotid: Velocities in the right ICA are consistent with a 1-39%  stenosis.         CCA mid >50% based on doubling of velocity, at patch site.   Left Carotid: Velocities in the left ICA are consistent with a 1-39%  stenosis.      ABI Findings:  +---------+------------------+-----+---------+--------+  Right  Rt Pressure (mmHg)IndexWaveform Comment   +---------+------------------+-----+---------+--------+  Brachial 161                      +---------+------------------+-----+---------+--------+  ATA   142        0.85 triphasic      +---------+------------------+-----+---------+--------+  PTA   137        0.82 triphasic      +---------+------------------+-----+---------+--------+  [place]        0.70 Normal        +---------+------------------+-----+---------+--------+   +---------+------------------+-----+----------+-------+  Left   Lt Pressure (mmHg)IndexWaveform Comment   +---------+------------------+-----+----------+-------+  Brachial 168                      +---------+------------------+-----+----------+-------+  ATA   123        0.73 monophasic      +---------+------------------+-----+----------+-------+  PTA   123        0.73 monophasic      +---------+------------------+-----+----------+-------+  Great Toe83        0.49 Abnormal       +---------+------------------+-----+----------+-------+   +-------+-----------+-----------+------------+------------+  ABI/TBIToday's ABIToday's TBIPrevious ABIPrevious TBI  +-------+-----------+-----------+------------+------------+  Right 0.85    0.7    0.95    0.66      +-------+-----------+-----------+------------+------------+  Left  0.73    0.49    1.0     0.48      +-------+-----------+-----------+------------+------------+   Summary:  Right: Resting right ankle-brachial index indicates mild right lower  extremity arterial disease. The right toe-brachial index is normal.   Left: Resting left ankle-brachial index indicates moderate left lower  extremity arterial disease. The left toe-brachial index is abnormal.       +-----------+--------+-----+---------------+----------+--------+  LEFT    PSV cm/sRatioStenosis    Waveform Comments  +-----------+--------+-----+---------------+----------+--------+  CFA Distal 145              biphasic       +-----------+--------+-----+---------------+----------+--------+  DFA    283      50-74% stenosisbiphasic       +-----------+--------+-----+---------------+----------+--------+  SFA Prox  304      50-74% stenosisbiphasic       +-----------+--------+-----+---------------+----------+--------+  SFA Mid  107              biphasic        +-----------+--------+-----+---------------+----------+--------+  SFA Distal 257      50-74% stenosisbiphasic       +-----------+--------+-----+---------------+----------+--------+  POP Prox  144              biphasic       +-----------+--------+-----+---------------+----------+--------+  POP Distal 58  biphasic       +-----------+--------+-----+---------------+----------+--------+  ATA Distal 29              biphasic       +-----------+--------+-----+---------------+----------+--------+  PTA Distal 38              monophasic      +-----------+--------+-----+---------------+----------+--------+  PERO Distal23              monophasic      +-----------+--------+-----+---------------+----------+--------+    Summary:  Left: 50-74% stenosis noted in the deep femoral artery. 50-74% stenosis  noted in the superficial femoral artery.     Abdominal Aorta Findings:  +-------------+-------+----------+----------+--------+--------+--------+  Location   AP (cm)Trans (cm)PSV (cm/s)WaveformThrombusComments  +-------------+-------+----------+----------+--------+--------+--------+  Proximal   1.70       101                  +-------------+-------+----------+----------+--------+--------+--------+  Mid     1.72       138                  +-------------+-------+----------+----------+--------+--------+--------+  Distal    1.49       101                  +-------------+-------+----------+----------+--------+--------+--------+  LT CIA Prox          188    biphasic          +-------------+-------+----------+----------+--------+--------+--------+  LT EIA Prox          208                   +-------------+-------+----------+----------+--------+--------+--------+  LT EIA Mid           215                  +-------------+-------+----------+----------+--------+--------+--------+  LT EIA Distal         263                  +-------------+-------+----------+----------+--------+--------+--------+   Left Stent(s):  +--------------+--------+---------------+--------+--------+  CIA      PSV cm/sStenosis    WaveformComments  +--------------+--------+---------------+--------+--------+  Proximal [id number]           biphasic      +--------------+--------+---------------+--------+--------+  Mid Stent   240   50-99% stenosisbiphasic      +--------------+--------+---------------+--------+--------+  Distal Stent 277   50-99% stenosisbiphasic      +--------------+--------+---------------+--------+--------+     SMA prox velocity 530/144 cm/s. IMA prox velocity 693 cm/s.    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   ASSESSMENT:  PAD left LE and Carotid stenosis   s/p atherectomy with angioplasty of left SFA and above-knee popliteal artery on December 02, 2019.  He previously had a left external iliac stent for left lower extremity claudication. He also under went left carotid endarterectomy in March  by Dr. Carlis Abbott.  Carotid stenosis is stable without restenosis on the left < 39% B  Left LE ABI shows a decrease in index from 1.0 biphasic to 0.92 monophasic without  return of symptoms.    Left SFA has stenosis proximally 304 cm/s and distally 257 cm/s with biphasic flow.    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    PLAN: He has stable carotids and left LE.  There are areas of stenosis, but he is asymptomatic currently. We will have him return for repeat ABI and exam in 6 months.  He will f/u in 1 year for carotid duplex and likely repeat ABI with arterial duplex.       Roxy Horseman PA-C [facility] Office: [phone]  MD in clinic [place]

## 2020-07-13 ENCOUNTER — Other Ambulatory Visit: Payer: Self-pay

## 2020-07-13 DIAGNOSIS — I739 Peripheral vascular disease, unspecified: Secondary | ICD-10-CM

## 2020-07-18 ENCOUNTER — Other Ambulatory Visit: Payer: Self-pay

## 2020-07-18 ENCOUNTER — Ambulatory Visit (HOSPITAL_COMMUNITY): Payer: HMO | Attending: Cardiology

## 2020-07-18 DIAGNOSIS — I350 Nonrheumatic aortic (valve) stenosis: Secondary | ICD-10-CM | POA: Diagnosis not present

## 2020-07-18 LAB — ECHOCARDIOGRAM COMPLETE
AR max vel: 1.51 cm2
AV Area VTI: 1.33 cm2
AV Area mean vel: 1.76 cm2
AV Mean grad: 18 mmHg
AV Peak grad: 32.3 mmHg
Ao pk vel: 2.84 m/s
Area-P 1/2: 3.12 cm2
S' Lateral: 2.6 cm

## 2020-07-28 DIAGNOSIS — M5416 Radiculopathy, lumbar region: Secondary | ICD-10-CM | POA: Diagnosis not present

## 2020-08-19 DIAGNOSIS — M5416 Radiculopathy, lumbar region: Secondary | ICD-10-CM | POA: Diagnosis not present

## 2020-08-19 DIAGNOSIS — M5136 Other intervertebral disc degeneration, lumbar region: Secondary | ICD-10-CM | POA: Diagnosis not present

## 2020-08-19 DIAGNOSIS — M961 Postlaminectomy syndrome, not elsewhere classified: Secondary | ICD-10-CM | POA: Diagnosis not present

## 2020-08-30 ENCOUNTER — Other Ambulatory Visit: Payer: Self-pay | Admitting: Neurology

## 2020-09-08 ENCOUNTER — Other Ambulatory Visit: Payer: Self-pay | Admitting: Vascular Surgery

## 2020-09-08 ENCOUNTER — Other Ambulatory Visit: Payer: Self-pay

## 2020-09-08 MED ORDER — CLOPIDOGREL BISULFATE 75 MG PO TABS
75.0000 mg | ORAL_TABLET | Freq: Every day | ORAL | 6 refills | Status: DC
Start: 2020-09-08 — End: 2021-03-17

## 2020-09-14 ENCOUNTER — Telehealth: Payer: Self-pay

## 2020-09-14 NOTE — Telephone Encounter (Signed)
RN case manager, Carter Kitten called to let us know pt is out of Plavix. I have let her know our triage RN refilled that rx on 09/08/20. Lattie Haw is going to reach out to pharmacy and will call us back if she has any other issues with the rx.

## 2020-10-18 DIAGNOSIS — M5416 Radiculopathy, lumbar region: Secondary | ICD-10-CM | POA: Diagnosis not present

## 2020-10-20 ENCOUNTER — Other Ambulatory Visit: Payer: Self-pay | Admitting: Student

## 2020-10-20 DIAGNOSIS — M5416 Radiculopathy, lumbar region: Secondary | ICD-10-CM

## 2020-11-05 ENCOUNTER — Ambulatory Visit
Admission: RE | Admit: 2020-11-05 | Discharge: 2020-11-05 | Disposition: A | Discharge status: Home / Self Care | Payer: HMO | Source: Ambulatory Visit | Attending: Student | Admitting: Student

## 2020-11-05 ENCOUNTER — Other Ambulatory Visit: Payer: Self-pay

## 2020-11-05 DIAGNOSIS — M48061 Spinal stenosis, lumbar region without neurogenic claudication: Secondary | ICD-10-CM | POA: Diagnosis not present

## 2020-11-05 DIAGNOSIS — M5126 Other intervertebral disc displacement, lumbar region: Secondary | ICD-10-CM | POA: Diagnosis not present

## 2020-11-05 DIAGNOSIS — R531 Weakness: Secondary | ICD-10-CM | POA: Diagnosis not present

## 2020-11-05 DIAGNOSIS — M5416 Radiculopathy, lumbar region: Secondary | ICD-10-CM

## 2020-11-05 DIAGNOSIS — M5127 Other intervertebral disc displacement, lumbosacral region: Secondary | ICD-10-CM | POA: Diagnosis not present

## 2020-11-05 MED ORDER — GADOBENATE DIMEGLUMINE 529 MG/ML IV SOLN
15.0000 mL | Freq: Once | INTRAVENOUS | Status: AC | PRN
  Administered 2020-11-05: 15 mL via INTRAVENOUS

## 2020-11-08 DIAGNOSIS — M5416 Radiculopathy, lumbar region: Secondary | ICD-10-CM | POA: Diagnosis not present

## 2020-11-08 DIAGNOSIS — I10 Essential (primary) hypertension: Secondary | ICD-10-CM | POA: Diagnosis not present

## 2020-11-14 ENCOUNTER — Other Ambulatory Visit: Payer: Self-pay | Admitting: Neurological Surgery

## 2020-11-14 DIAGNOSIS — M48061 Spinal stenosis, lumbar region without neurogenic claudication: Secondary | ICD-10-CM

## 2020-11-22 DIAGNOSIS — M5416 Radiculopathy, lumbar region: Secondary | ICD-10-CM | POA: Diagnosis not present

## 2020-11-29 DIAGNOSIS — M5416 Radiculopathy, lumbar region: Secondary | ICD-10-CM | POA: Diagnosis not present

## 2020-12-02 NOTE — Pre-Procedure Instructions (Signed)
Surgical Instructions    Your procedure is scheduled on Wednesday, July 13th.  Report to [facility] Main Entrance "A" at 11:00 A.M., then check in with the Admitting office.  Call this number if you have problems the morning of surgery:  [phone]   If you have any questions prior to your surgery date call [phone]: Open Monday-Friday 8am-4pm    Remember:  Do not eat or drink after midnight the night before your surgery    Take these medicines the morning of surgery with A SIP OF WATER  carvedilol (COREG)  pantoprazole (PROTONIX) rosuvastatin (CRESTOR)  Follow your surgeon's instructions on when to stop clopidogrel (PLAVIX).  If no instructions were given by your surgeon then you will need to call the office to get those instructions.    As of today, STOP taking any Aleve, Naproxen, Ibuprofen, Motrin, Advil, Goody's, BC's, all herbal medications, fish oil, and all vitamins.  WHAT DO I DO ABOUT MY DIABETES MEDICATION?   Do not take metFORMIN (GLUCOPHAGE-XR), pioglitazone (ACTOS), or repaglinide (PRANDIN) the morning of surgery.  THE MORNING OF SURGERY, take 25 units of insulin degludec (TRESIBA FLEXTOUCH) insulin.   HOW TO MANAGE YOUR DIABETES BEFORE AND AFTER SURGERY  Why is it important to control my blood sugar before and after surgery? Improving blood sugar levels before and after surgery helps healing and can limit problems. A way of improving blood sugar control is eating a healthy diet by:  Eating less sugar and carbohydrates  Increasing activity/exercise  Talking with your doctor about reaching your blood sugar goals High blood sugars (greater than 180 mg/dL) can raise your risk of infections and slow your recovery, so you will need to focus on controlling your diabetes during the weeks before surgery. Make sure that the doctor who takes care of your diabetes knows about your planned surgery including the date and location.  How do I manage my blood sugar  before surgery? Check your blood sugar at least 4 times a day, starting 2 days before surgery, to make sure that the level is not too high or low.  Check your blood sugar the morning of your surgery when you wake up and every 2 hours until you get to the Short Stay unit.  If your blood sugar is less than 70 mg/dL, you will need to treat for low blood sugar: Do not take insulin. Treat a low blood sugar (less than 70 mg/dL) with  cup of clear juice (cranberry or apple), 4 glucose tablets, OR glucose gel. Recheck blood sugar in 15 minutes after treatment (to make sure it is greater than 70 mg/dL). If your blood sugar is not greater than 70 mg/dL on recheck, call [phone] for further instructions. Report your blood sugar to the short stay nurse when you get to Short Stay.  If you are admitted to the hospital after surgery: Your blood sugar will be checked by the staff and you will probably be given insulin after surgery (instead of oral diabetes medicines) to make sure you have good blood sugar levels. The goal for blood sugar control after surgery is 80-180 mg/dL.                      Do NOT Smoke (Tobacco/Vaping) or drink Alcohol 24 hours prior to your procedure.  If you use a CPAP at night, you may bring all equipment for your overnight stay.   Contacts, glasses, piercing's, hearing aid's, dentures or partials may not be worn  into surgery, please bring cases for these belongings.    For patients admitted to the hospital, discharge time will be determined by your treatment team.   Patients discharged the day of surgery will not be allowed to drive home, and someone needs to stay with them for 24 hours.  ONLY 1 SUPPORT PERSON MAY BE PRESENT WHILE YOU ARE IN SURGERY. IF YOU ARE TO BE ADMITTED ONCE YOU ARE IN YOUR ROOM YOU WILL BE ALLOWED TWO (2) VISITORS.  Minor children may have two parents present. Special consideration for safety and communication needs will be reviewed on a case by  case basis.   Special instructions:   [facility]- Preparing For Surgery  Before surgery, you can play an important role. Because skin is not sterile, your skin needs to be as free of germs as possible. You can reduce the number of germs on your skin by washing with CHG (chlorahexidine gluconate) Soap before surgery.  CHG is an antiseptic cleaner which kills germs and bonds with the skin to continue killing germs even after washing.    Oral Hygiene is also important to reduce your risk of infection.  Remember - BRUSH YOUR TEETH THE MORNING OF SURGERY WITH YOUR REGULAR TOOTHPASTE  Please do not use if you have an allergy to CHG or antibacterial soaps. If your skin becomes reddened/irritated stop using the CHG.  Do not shave (including legs and underarms) for at least 48 hours prior to first CHG shower. It is OK to shave your face.  Please follow these instructions carefully.   Shower the NIGHT BEFORE SURGERY and the MORNING OF SURGERY  If you chose to wash your hair, wash your hair first as usual with your normal shampoo.  After you shampoo, rinse your hair and body thoroughly to remove the shampoo.  Use CHG Soap as you would any other liquid soap. You can apply CHG directly to the skin and wash gently with a scrungie or a clean washcloth.   Apply the CHG Soap to your body ONLY FROM THE NECK DOWN.  Do not use on open wounds or open sores. Avoid contact with your eyes, ears, mouth and genitals (private parts). Wash Face and genitals (private parts)  with your normal soap.   Wash thoroughly, paying special attention to the area where your surgery will be performed.  Thoroughly rinse your body with warm water from the neck down.  DO NOT shower/wash with your normal soap after using and rinsing off the CHG Soap.  Pat yourself dry with a CLEAN TOWEL.  Wear CLEAN PAJAMAS to bed the night before surgery  Place CLEAN SHEETS on your bed the night before your surgery  DO NOT SLEEP WITH  PETS.   Day of Surgery: Shower with CHG soap. Do not wear jewelry. Do not wear lotions, powders, colognes, or deodorant. Men may shave face and neck. Do not bring valuables to the hospital. [facility] is not responsible for any belongings or valuables. Wear Clean/Comfortable clothing the morning of surgery Remember to brush your teeth WITH YOUR REGULAR TOOTHPASTE.   Please read over the following fact sheets that you were given.

## 2020-12-05 ENCOUNTER — Encounter (HOSPITAL_COMMUNITY): Payer: Self-pay

## 2020-12-05 ENCOUNTER — Ambulatory Visit (HOSPITAL_COMMUNITY)
Admission: RE | Admit: 2020-12-05 | Discharge: 2020-12-05 | Disposition: A | Discharge status: Home / Self Care | Payer: HMO | Source: Ambulatory Visit | Attending: Neurological Surgery | Admitting: Neurological Surgery

## 2020-12-05 ENCOUNTER — Other Ambulatory Visit: Payer: Self-pay

## 2020-12-05 ENCOUNTER — Encounter (HOSPITAL_COMMUNITY)
Admission: RE | Admit: 2020-12-05 | Discharge: 2020-12-05 | Disposition: A | Discharge status: Home / Self Care | Payer: HMO | Source: Ambulatory Visit | Attending: Neurological Surgery | Admitting: Neurological Surgery

## 2020-12-05 DIAGNOSIS — Z8673 Personal history of transient ischemic attack (TIA), and cerebral infarction without residual deficits: Secondary | ICD-10-CM | POA: Insufficient documentation

## 2020-12-05 DIAGNOSIS — Z87891 Personal history of nicotine dependence: Secondary | ICD-10-CM | POA: Insufficient documentation

## 2020-12-05 DIAGNOSIS — M48061 Spinal stenosis, lumbar region without neurogenic claudication: Secondary | ICD-10-CM | POA: Insufficient documentation

## 2020-12-05 DIAGNOSIS — E1151 Type 2 diabetes mellitus with diabetic peripheral angiopathy without gangrene: Secondary | ICD-10-CM | POA: Insufficient documentation

## 2020-12-05 DIAGNOSIS — Z20822 Contact with and (suspected) exposure to covid-19: Secondary | ICD-10-CM | POA: Diagnosis not present

## 2020-12-05 DIAGNOSIS — R011 Cardiac murmur, unspecified: Secondary | ICD-10-CM | POA: Diagnosis not present

## 2020-12-05 DIAGNOSIS — I10 Essential (primary) hypertension: Secondary | ICD-10-CM | POA: Insufficient documentation

## 2020-12-05 DIAGNOSIS — I6521 Occlusion and stenosis of right carotid artery: Secondary | ICD-10-CM | POA: Diagnosis not present

## 2020-12-05 DIAGNOSIS — Z01818 Encounter for other preprocedural examination: Secondary | ICD-10-CM | POA: Diagnosis not present

## 2020-12-05 LAB — BASIC METABOLIC PANEL
Anion gap: 8 (ref 5–15)
BUN: 14 mg/dL (ref 8–23)
CO2: 20 mmol/L — ABNORMAL LOW (ref 22–32)
Calcium: 8.9 mg/dL (ref 8.9–10.3)
Chloride: 107 mmol/L (ref 98–111)
Creatinine, Ser: 0.96 mg/dL (ref 0.61–1.24)
GFR, Estimated: >60 mL/min (ref >60)
Glucose, Bld: 315 mg/dL — ABNORMAL HIGH (ref 70–99)
Potassium: 4.1 mmol/L (ref 3.5–5.1)
Sodium: 135 mmol/L (ref 135–145)

## 2020-12-05 LAB — CBC WITH DIFFERENTIAL/PLATELET
Abs Immature Granulocytes: 0.02 10*3/uL (ref 0.00–0.07)
Basophils Absolute: 0 10*3/uL (ref 0.0–0.1)
Basophils Relative: 1 %
Eosinophils Absolute: 0.1 10*3/uL (ref 0.0–0.5)
Eosinophils Relative: 1 %
HCT: 40.4 % (ref 39.0–52.0)
Hemoglobin: 13.1 g/dL (ref 13.0–17.0)
Immature Granulocytes: 0 %
Lymphocytes Relative: 24 %
Lymphs Abs: 1.5 10*3/uL (ref 0.7–4.0)
MCH: 28.6 pg (ref 26.0–34.0)
MCHC: 32.4 g/dL (ref 30.0–36.0)
MCV: 88.2 fL (ref 80.0–100.0)
Monocytes Absolute: 0.7 10*3/uL (ref 0.1–1.0)
Monocytes Relative: 12 %
Neutro Abs: 3.8 10*3/uL (ref 1.7–7.7)
Neutrophils Relative %: 62 %
Platelets: 262 10*3/uL (ref 150–400)
RBC: 4.58 MIL/uL (ref 4.22–5.81)
RDW: 15.3 % (ref 11.5–15.5)
WBC: 6.1 10*3/uL (ref 4.0–10.5)
nRBC: 0 % (ref 0.0–0.2)

## 2020-12-05 LAB — SURGICAL PCR SCREEN
MRSA, PCR: NEGATIVE
Staphylococcus aureus: NEGATIVE

## 2020-12-05 LAB — TYPE AND SCREEN
ABO/RH(D): O POS
Antibody Screen: NEGATIVE

## 2020-12-05 LAB — PROTIME-INR
INR: 1 (ref 0.8–1.2)
Prothrombin Time: 13.2 seconds (ref 11.4–15.2)

## 2020-12-05 LAB — HEMOGLOBIN A1C
Hgb A1c MFr Bld: 9.4 % — ABNORMAL HIGH (ref 4.8–5.6)
Mean Plasma Glucose: 223.08 mg/dL

## 2020-12-05 LAB — SARS CORONAVIRUS 2 (TAT 6-24 HRS): SARS Coronavirus 2: NEGATIVE

## 2020-12-05 NOTE — Progress Notes (Signed)
PCP - Dr. Donnie Coffin Cardiologist - Dr. Gwyndolyn Kaufman  Chest x-ray - 12/05/20 EKG - 12/05/20 Stress Test - 8+ year ago. Pt had it done for work and it was normal. ECHO - 07/18/20 Cardiac Cath - denies  Sleep Study - denies CPAP - denies  Fasting Blood Sugar - 175-250 Checks Blood Sugar 12+ times a day with freestyle libre Will collect A1C today.  Blood Thinner Instructions: LD plavix 11/15/20.  Aspirin Instructions: n/a  COVID TEST- 12/05/20  Anesthesia review: Yes, heart hx.  Patient denies shortness of breath, fever, cough and chest pain at PAT appointment   All instructions explained to the patient, with a verbal understanding of the material. Patient agrees to go over the instructions while at home for a better understanding. Patient also instructed to self quarantine after being tested for COVID-19. The opportunity to ask questions was provided.

## 2020-12-06 NOTE — Progress Notes (Signed)
Anesthesia Chart Review:  Case: [id number] Date/Time: 12/07/20 1250   Procedure: PLIF - L4-L5 (Back)   Anesthesia type: General   Pre-op diagnosis: Stenosis   Location: [facility] OR ROOM 1 / [facility]   Surgeons: Eustace Moore, MD       DISCUSSION: Patient is a 70 year old male scheduled for the above procedure.  History includes former smoker (quit '17), HTN, DM2, PAD (s/p left EIA stent 06/18/18; atherectomy/angioplasty left SFA 12/02/19), CVA '12, carotid artery stenosis (s/p right carotid endarterectomy 08/24/19), murmur (mild-moderate AS 2/022) , facial cosmetic surgery (not specified '14), hiatal hernia, back surgery (s/p left L4-5 microdiscectomy 08/01/18 with redo 10/15/18).  Last cardiology visit with Dr. Johney Frame 06/29/2020.  He had an episode of atypical nonexertional chest pain with low suspicion for cardiac etiology.  Continue to monitor.  She did order a repeat echo to follow-up aortic stenosis.  Results showed stable mild to moderate AAS.  She wrote, "We will continue to monitor [AS] every 2 years unless he becomes more symptomatic at which point, we will reassess sooner. Six month office follow-up planned.   Last Plavix 11/15/2020 (on for PAD, started after 05/2018 left EIA stent).  12/05/2020 presurgical COVID-19 test negative.  Anesthesia team to evaluate on the day of surgery.   VS: BP (!) 159/59   Pulse 62   Temp 36.6 C (Oral)   Resp 18   Ht 5\' 10"  (1.778 m)   Wt 84.8 kg   SpO2 100%   BMI 26.82 kg/m    PROVIDERS: Mitchell, [name], MD  is PCP [facility]) - Marcial Pacas, MD is neurologist - Monica Martinez, MD is vascular surgeon last visit 07/12/2020 by Julio Sicks, PA-C. - Gwyndolyn Kaufman, MD is cardiologist - Sherley Bounds, MD is neurosurgeon  - Marcial Pacas, MD is neurologist   LABS: Preoperative labs noted. A1c 9.4%, routed to Dr. Ronnald Ramp.  (all labs ordered are listed, but only abnormal results are displayed)  Labs Reviewed  BASIC METABOLIC PANEL -  Abnormal; Notable for the following components:      Result Value   CO2 20 (*)    Glucose, Bld 315 (*)    All other components within normal limits  HEMOGLOBIN A1C - Abnormal; Notable for the following components:   Hgb A1c MFr Bld 9.4 (*)    All other components within normal limits  SURGICAL PCR SCREEN  SARS CORONAVIRUS 2 (TAT 6-24 HRS)  CBC WITH DIFFERENTIAL/PLATELET  PROTIME-INR  TYPE AND SCREEN     IMAGES: CXR 12/05/20: FINDINGS: Stable cardiomediastinal silhouette with normal heart size. No pneumothorax. No pleural effusion. Lungs appear clear, with no acute consolidative airspace disease and no pulmonary edema. IMPRESSION: No active cardiopulmonary disease.  MRI L-spine 11/05/20: IMPRESSION: 1. 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 2. L4-5 bilateral subarticular recess narrowing that could affect either L5 nerve root. 3. L4-5 and L5-S1 moderate right foraminal narrowing   EKG: 12/05/20: Sinus bradycardia with 1st degree A-V block Otherwise normal ECG Confirmed by Eleonore Chiquito ([id number]) on 12/05/2020 5:51:33 PM   CV: Echo 07/18/20: IMPRESSIONS   1. The aortic valve is tricuspid. There is moderate calcification of the  aortic valve. There is moderate thickening of the aortic valve. Aortic  valve regurgitation is not visualized. Mild to moderate aortic valve  stenosis. Aortic valve area, by VTI  measures 1.33 cm. Aortic valve mean gradient measures 18.0 mmHg. Aortic  valve Vmax measures 2.84 m/s.  2. Left ventricular ejection fraction, by estimation, is 60 to 65%. The  left ventricle has normal function. The left ventricle has no regional  wall motion abnormalities. There is mild concentric left ventricular  hypertrophy. Left ventricular diastolic  parameters are consistent with Grade I diastolic dysfunction (impaired  relaxation).   3. Right ventricular systolic function is normal. The right  ventricular  size is normal. Tricuspid regurgitation signal is inadequate for assessing  PA pressure.   4. The mitral valve is grossly normal. Trivial mitral valve  regurgitation. No evidence of mitral stenosis.   5. The inferior vena cava is normal in size with greater than 50%  respiratory variability, suggesting right atrial pressure of 3 mmHg.  - Comparison(s): No significant change from prior study. AS remains mild to  moderate when compared with 09/22/2019.   Carotid US 07/12/20: Summary:  Right Carotid: Velocities in the right ICA are consistent with a 1-39%  stenosis. CCA mid >50% based on doubling of velocity, at patch site.  Left Carotid: Velocities in the left ICA are consistent with a 1-39%  stenosis.   He reported a normal stress test 8+ years ago.   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/2009   Dysphagia    Esophageal dysmotility    GERD (gastroesophageal reflux disease)    Heart murmur    History of hiatal hernia    History of Holter monitoring 2009   History of kidney stones    HNP (herniated nucleus pulposus), lumbar    L4-5   HYPERCHOLESTEROLEMIA 11/30/2009   Hyperlipidemia    HYPERTENSION 11/30/2009   Kidney stones    Numbness and tingling    Peripheral vascular disease ([place])    Right carotid bruit    SMOKER 11/30/2009   Stroke ([place]) 2012   URINARY CALCULUS 11/30/2009    Past Surgical History:  Procedure Laterality Date   ABDOMINAL AORTOGRAM W/LOWER EXTREMITY N/A 06/18/2018   Procedure: ABDOMINAL AORTOGRAM W/LOWER EXTREMITY;  Surgeon: Marty Heck, MD;  Location: [place] [facility];  Service: Cardiovascular;  Laterality: N/A;   ABDOMINAL AORTOGRAM W/LOWER EXTREMITY N/A 12/02/2019   Procedure: ABDOMINAL AORTOGRAM W/LOWER EXTREMITY;  Surgeon: Marty Heck, MD;   Location: [place] [facility];  Service: Cardiovascular;  Laterality: N/A;   arthroscopy of right shoulder Right    Jan 2019, June 2019-- done at [place], Dr. Ninfa Linden   BACK SURGERY     CATARACT EXTRACTION W/ INTRAOCULAR LENS  IMPLANT, BILATERAL     CYSTOSCOPY     ENDARTERECTOMY Right 08/24/2019   Procedure: ENDARTERECTOMY CAROTID;  Surgeon: Marty Heck, MD;  Location: [facility];  Service: Vascular;  Laterality: Right;   FACIAL COSMETIC SURGERY  2014   FINGER SURGERY  2002   LUMBAR LAMINECTOMY/DECOMPRESSION MICRODISCECTOMY Left 08/01/2018   Procedure: Microdiscectomy - Lumbar four-Lumbar five - left;  Surgeon: Eustace Moore, MD;  Location: [facility];  Service: Neurosurgery;  Laterality: Left;   LUMBAR LAMINECTOMY/DECOMPRESSION MICRODISCECTOMY Left 10/15/2018   Procedure: Re-do Microdiscectomy - left - Lumbar four-Lumbar five;  Surgeon: Eustace Moore, MD;  Location: [employer];  Service: Neurosurgery;  Laterality: Left;   NECK SURGERY  1986   PERIPHERAL VASCULAR ATHERECTOMY  12/02/2019   Procedure: PERIPHERAL VASCULAR ATHERECTOMY;  Surgeon:  Marty Heck, MD;  Location: [place] [facility];  Service: Cardiovascular;;  left SFA   PERIPHERAL VASCULAR INTERVENTION  06/18/2018   Procedure: PERIPHERAL VASCULAR INTERVENTION;  Surgeon: Marty Heck, MD;  Location: [place] [facility];  Service: Cardiovascular;;  Left external iliac   ROTATOR CUFF REPAIR Left    UMBILICAL HERNIA REPAIR      MEDICATIONS:  amLODipine-benazepril (LOTREL) 10-40 MG per capsule   aspirin EC 81 MG EC tablet   carvedilol (COREG) 6.25 MG tablet   clopidogrel (PLAVIX) 75 MG tablet   ferrous sulfate 325 (65 FE) MG tablet   insulin degludec (TRESIBA FLEXTOUCH) 100 UNIT/ML FlexTouch Pen   metFORMIN (GLUCOPHAGE-XR) 500 MG 24 hr tablet   ONETOUCH VERIO test strip   pantoprazole (PROTONIX) 40 MG tablet   pioglitazone (ACTOS) 45 MG tablet   repaglinide (PRANDIN) 2 MG tablet   rosuvastatin (CRESTOR)  40 MG tablet   No current facility-administered medications for this encounter.    Myra Gianotti, PA-C Surgical Short Stay/Anesthesiology [facility] Phone [phone] [facility] Phone [phone] 12/06/2020 6:18 PM

## 2020-12-06 NOTE — Anesthesia Preprocedure Evaluation (Addendum)
Anesthesia Evaluation  Patient identified by MRN, date of birth, ID band Patient awake    Reviewed: Allergy & Precautions, NPO status , Patient's Chart, lab work & pertinent test results, reviewed documented beta blocker date and time   Airway Mallampati: II  TM Distance: >3 FB Neck ROM: Full    Dental  (+) Dental Advisory Given, Missing, Poor Dentition,    Pulmonary neg pulmonary ROS, former smoker,    Pulmonary exam normal breath sounds clear to auscultation       Cardiovascular hypertension, Pt. on medications and Pt. on home beta blockers + Peripheral Vascular Disease  + Valvular Problems/Murmurs AS  Rhythm:Regular Rate:Normal + Systolic murmurs Echo 8/88/91: IMPRESSIONS  1. The aortic valve is tricuspid. There is moderate calcification of the  aortic valve. There is moderate thickening of the aortic valve. Aortic  valve regurgitation is not visualized. Mild to moderate aortic valve  stenosis. Aortic valve area, by VTI  measures 1.33 cm. Aortic valve mean gradient measures 18.0 mmHg. Aortic  valve Vmax measures 2.84 m/s.  2. Left ventricular ejection fraction, by estimation, is 60 to 65%. The  left ventricle has normal function. The left ventricle has no regional  wall motion abnormalities. There is mild concentric left ventricular  hypertrophy. Left ventricular diastolic  parameters are consistent with Grade I diastolic dysfunction (impaired  relaxation).  3. Right ventricular systolic function is normal. The right ventricular  size is normal. Tricuspid regurgitation signal is inadequate for assessing  PA pressure.  4. The mitral valve is grossly normal. Trivial mitral valve  regurgitation. No evidence of mitral stenosis.  5. The inferior vena cava is normal in size with greater than 50%  respiratory variability, suggesting right atrial pressure of 3 mmHg.  - Comparison(s): No significant change from prior study. AS  remains mild to  moderate when compared with 09/22/2019.   Neuro/Psych TIA Neuromuscular disease CVA, No Residual Symptoms    GI/Hepatic Neg liver ROS, hiatal hernia, GERD  Medicated,  Endo/Other  diabetes, Type 2, Insulin Dependent, Oral Hypoglycemic Agents  Renal/GU negative Renal ROS     Musculoskeletal  (+) Arthritis ,   Abdominal   Peds  Hematology negative hematology ROS (+)   Anesthesia Other Findings Day of surgery medications reviewed with the patient.  Reproductive/Obstetrics                           Anesthesia Physical Anesthesia Plan  ASA: 3  Anesthesia Plan: General   Post-op Pain Management:    Induction: Intravenous  PONV Risk Score and Plan: 2 and Dexamethasone and Ondansetron  Airway Management Planned: Oral ETT  Additional Equipment:   Intra-op Plan:   Post-operative Plan: Extubation in OR  Informed Consent: I have reviewed the patients History and Physical, chart, labs and discussed the procedure including the risks, benefits and alternatives for the proposed anesthesia with the patient or authorized representative who has indicated his/her understanding and acceptance.     Dental advisory given  Plan Discussed with: CRNA  Anesthesia Plan Comments: (PAT note written 12/06/2020 by Myra Gianotti, PA-C. )       Anesthesia Quick Evaluation

## 2020-12-07 ENCOUNTER — Ambulatory Visit (HOSPITAL_COMMUNITY): Payer: HMO

## 2020-12-07 ENCOUNTER — Ambulatory Visit (HOSPITAL_COMMUNITY)
Admission: RE | Admit: 2020-12-07 | Discharge: 2020-12-08 | Disposition: A | Discharge status: Home / Self Care | Payer: HMO | Attending: Neurological Surgery | Admitting: Neurological Surgery

## 2020-12-07 ENCOUNTER — Other Ambulatory Visit: Payer: Self-pay

## 2020-12-07 ENCOUNTER — Ambulatory Visit (HOSPITAL_COMMUNITY): Payer: HMO | Admitting: Certified Registered"

## 2020-12-07 ENCOUNTER — Encounter (HOSPITAL_COMMUNITY): Payer: Self-pay | Admitting: Neurological Surgery

## 2020-12-07 ENCOUNTER — Encounter (HOSPITAL_COMMUNITY)
Admission: RE | Disposition: A | Discharge status: Home / Self Care | Payer: Self-pay | Source: Home / Self Care | Attending: Neurological Surgery

## 2020-12-07 DIAGNOSIS — M21372 Foot drop, left foot: Secondary | ICD-10-CM | POA: Diagnosis not present

## 2020-12-07 DIAGNOSIS — Z419 Encounter for procedure for purposes other than remedying health state, unspecified: Secondary | ICD-10-CM

## 2020-12-07 DIAGNOSIS — M5116 Intervertebral disc disorders with radiculopathy, lumbar region: Secondary | ICD-10-CM | POA: Insufficient documentation

## 2020-12-07 DIAGNOSIS — Z794 Long term (current) use of insulin: Secondary | ICD-10-CM | POA: Insufficient documentation

## 2020-12-07 DIAGNOSIS — Z7982 Long term (current) use of aspirin: Secondary | ICD-10-CM | POA: Insufficient documentation

## 2020-12-07 DIAGNOSIS — Z7984 Long term (current) use of oral hypoglycemic drugs: Secondary | ICD-10-CM | POA: Diagnosis not present

## 2020-12-07 DIAGNOSIS — M4326 Fusion of spine, lumbar region: Secondary | ICD-10-CM | POA: Diagnosis not present

## 2020-12-07 DIAGNOSIS — Z79899 Other long term (current) drug therapy: Secondary | ICD-10-CM | POA: Insufficient documentation

## 2020-12-07 DIAGNOSIS — Z885 Allergy status to narcotic agent status: Secondary | ICD-10-CM | POA: Insufficient documentation

## 2020-12-07 DIAGNOSIS — M48061 Spinal stenosis, lumbar region without neurogenic claudication: Secondary | ICD-10-CM | POA: Diagnosis not present

## 2020-12-07 DIAGNOSIS — Z7902 Long term (current) use of antithrombotics/antiplatelets: Secondary | ICD-10-CM | POA: Insufficient documentation

## 2020-12-07 DIAGNOSIS — Z87891 Personal history of nicotine dependence: Secondary | ICD-10-CM | POA: Insufficient documentation

## 2020-12-07 DIAGNOSIS — M532X6 Spinal instabilities, lumbar region: Secondary | ICD-10-CM | POA: Diagnosis not present

## 2020-12-07 DIAGNOSIS — Z981 Arthrodesis status: Secondary | ICD-10-CM | POA: Diagnosis not present

## 2020-12-07 DIAGNOSIS — M5416 Radiculopathy, lumbar region: Secondary | ICD-10-CM | POA: Diagnosis not present

## 2020-12-07 DIAGNOSIS — E7800 Pure hypercholesterolemia, unspecified: Secondary | ICD-10-CM | POA: Diagnosis not present

## 2020-12-07 DIAGNOSIS — K219 Gastro-esophageal reflux disease without esophagitis: Secondary | ICD-10-CM | POA: Diagnosis not present

## 2020-12-07 LAB — GLUCOSE, CAPILLARY
Glucose-Capillary: 107 mg/dL — ABNORMAL HIGH (ref 70–99)
Glucose-Capillary: 117 mg/dL — ABNORMAL HIGH (ref 70–99)
Glucose-Capillary: 80 mg/dL (ref 70–99)
Glucose-Capillary: 154 mg/dL — ABNORMAL HIGH (ref 70–99)

## 2020-12-07 SURGERY — POSTERIOR LUMBAR FUSION 1 LEVEL
Anesthesia: General | Site: Back

## 2020-12-07 MED ORDER — ONDANSETRON HCL 4 MG/2ML IJ SOLN
4.0000 mg | Freq: Four times a day (QID) | INTRAMUSCULAR | Status: DC | PRN
  Administered 2020-12-08: 4 mg via INTRAVENOUS
  Filled 2020-12-07: qty 2

## 2020-12-07 MED ORDER — FENTANYL CITRATE (PF) 100 MCG/2ML IJ SOLN
25.0000 ug | INTRAMUSCULAR | Status: DC | PRN
  Administered 2020-12-07 (×3): 50 ug via INTRAVENOUS

## 2020-12-07 MED ORDER — ORAL CARE MOUTH RINSE: 15.0000 mL | Freq: Once | OROMUCOSAL | Status: AC

## 2020-12-07 MED ORDER — ACETAMINOPHEN 650 MG RE SUPP: 650.0000 mg | RECTAL | Status: DC | PRN

## 2020-12-07 MED ORDER — BUPIVACAINE HCL (PF) 0.25 % IJ SOLN
INTRAMUSCULAR | Status: DC | PRN
  Administered 2020-12-07: 8 mL
  Administered 2020-12-07: 10 mL

## 2020-12-07 MED ORDER — PROPOFOL 10 MG/ML IV BOLUS
INTRAVENOUS | Status: AC
  Filled 2020-12-07: qty 40

## 2020-12-07 MED ORDER — LACTATED RINGERS IV SOLN: INTRAVENOUS | Status: DC

## 2020-12-07 MED ORDER — DEXAMETHASONE SODIUM PHOSPHATE 10 MG/ML IJ SOLN
INTRAMUSCULAR | Status: AC
  Filled 2020-12-07: qty 1

## 2020-12-07 MED ORDER — METHOCARBAMOL 1000 MG/10ML IJ SOLN
500.0000 mg | Freq: Four times a day (QID) | INTRAVENOUS | Status: DC | PRN
  Filled 2020-12-07: qty 5

## 2020-12-07 MED ORDER — ONDANSETRON HCL 4 MG PO TABS: 4.0000 mg | ORAL_TABLET | Freq: Four times a day (QID) | ORAL | Status: DC | PRN

## 2020-12-07 MED ORDER — FENTANYL CITRATE (PF) 100 MCG/2ML IJ SOLN
INTRAMUSCULAR | Status: DC | PRN
  Administered 2020-12-07: 100 ug via INTRAVENOUS
  Administered 2020-12-07: 50 ug via INTRAVENOUS
  Administered 2020-12-07: 100 ug via INTRAVENOUS

## 2020-12-07 MED ORDER — LIDOCAINE 2% (20 MG/ML) 5 ML SYRINGE
INTRAMUSCULAR | Status: DC | PRN
  Administered 2020-12-07: 80 mg via INTRAVENOUS

## 2020-12-07 MED ORDER — DEXAMETHASONE SODIUM PHOSPHATE 10 MG/ML IJ SOLN
10.0000 mg | Freq: Once | INTRAMUSCULAR | Status: DC
  Filled 2020-12-07: qty 1

## 2020-12-07 MED ORDER — BUPIVACAINE HCL (PF) 0.25 % IJ SOLN
INTRAMUSCULAR | Status: AC
  Filled 2020-12-07: qty 30

## 2020-12-07 MED ORDER — CHLORHEXIDINE GLUCONATE 0.12 % MT SOLN
15.0000 mL | Freq: Once | OROMUCOSAL | Status: AC
  Administered 2020-12-07: 15 mL via OROMUCOSAL
  Filled 2020-12-07: qty 15

## 2020-12-07 MED ORDER — MENTHOL 3 MG MT LOZG: 1.0000 | LOZENGE | OROMUCOSAL | Status: DC | PRN

## 2020-12-07 MED ORDER — THROMBIN 5000 UNITS EX SOLR: OROMUCOSAL | Status: DC | PRN

## 2020-12-07 MED ORDER — ACETAMINOPHEN 500 MG PO TABS
1000.0000 mg | ORAL_TABLET | ORAL | Status: AC
  Administered 2020-12-07: 1000 mg via ORAL
  Filled 2020-12-07: qty 2

## 2020-12-07 MED ORDER — LACTATED RINGERS IV SOLN: INTRAVENOUS | Status: DC | PRN

## 2020-12-07 MED ORDER — FENTANYL CITRATE (PF) 250 MCG/5ML IJ SOLN
INTRAMUSCULAR | Status: AC
  Filled 2020-12-07: qty 5

## 2020-12-07 MED ORDER — AMLODIPINE BESYLATE 5 MG PO TABS: 2.5000 mg | ORAL_TABLET | Freq: Every day | ORAL | Status: DC

## 2020-12-07 MED ORDER — PROPOFOL 10 MG/ML IV BOLUS
INTRAVENOUS | Status: DC | PRN
  Administered 2020-12-07: 150 mg via INTRAVENOUS

## 2020-12-07 MED ORDER — INSULIN ASPART 100 UNIT/ML IJ SOLN
0.0000 [IU] | Freq: Three times a day (TID) | INTRAMUSCULAR | Status: DC
  Administered 2020-12-08: 3 [IU] via SUBCUTANEOUS

## 2020-12-07 MED ORDER — ALUM & MAG HYDROXIDE-SIMETH 200-200-20 MG/5ML PO SUSP: 30.0000 mL | ORAL | Status: DC | PRN

## 2020-12-07 MED ORDER — CELECOXIB 200 MG PO CAPS
200.0000 mg | ORAL_CAPSULE | Freq: Two times a day (BID) | ORAL | Status: DC
  Administered 2020-12-07: 200 mg via ORAL
  Filled 2020-12-07: qty 1

## 2020-12-07 MED ORDER — AMLODIPINE BESYLATE 5 MG PO TABS
10.0000 mg | ORAL_TABLET | Freq: Every day | ORAL | Status: DC
  Administered 2020-12-07: 10 mg via ORAL
  Filled 2020-12-07: qty 2

## 2020-12-07 MED ORDER — THROMBIN 20000 UNITS EX SOLR
CUTANEOUS | Status: AC
  Filled 2020-12-07: qty 20000

## 2020-12-07 MED ORDER — SODIUM CHLORIDE 0.9% FLUSH: 3.0000 mL | Freq: Two times a day (BID) | INTRAVENOUS | Status: DC

## 2020-12-07 MED ORDER — BENAZEPRIL HCL 40 MG PO TABS
40.0000 mg | ORAL_TABLET | Freq: Every day | ORAL | Status: DC
  Filled 2020-12-07 (×2): qty 1

## 2020-12-07 MED ORDER — FENTANYL CITRATE (PF) 100 MCG/2ML IJ SOLN
INTRAMUSCULAR | Status: AC
  Filled 2020-12-07: qty 2

## 2020-12-07 MED ORDER — THROMBIN 20000 UNITS EX SOLR: CUTANEOUS | Status: DC | PRN

## 2020-12-07 MED ORDER — ACETAMINOPHEN 325 MG PO TABS
650.0000 mg | ORAL_TABLET | ORAL | Status: DC | PRN
  Administered 2020-12-07 – 2020-12-08 (×2): 650 mg via ORAL
  Filled 2020-12-07 (×2): qty 2

## 2020-12-07 MED ORDER — 0.9 % SODIUM CHLORIDE (POUR BTL) OPTIME
TOPICAL | Status: DC | PRN
  Administered 2020-12-07: 1000 mL

## 2020-12-07 MED ORDER — BENAZEPRIL HCL 5 MG PO TABS: 10.0000 mg | ORAL_TABLET | Freq: Every day | ORAL | Status: DC

## 2020-12-07 MED ORDER — OXYCODONE HCL 5 MG PO TABS
10.0000 mg | ORAL_TABLET | ORAL | Status: DC | PRN
Start: 2020-12-07 — End: 2020-12-08
  Administered 2020-12-07 – 2020-12-08 (×4): 10 mg via ORAL
  Filled 2020-12-07 (×4): qty 2

## 2020-12-07 MED ORDER — CEFAZOLIN SODIUM-DEXTROSE 2-4 GM/100ML-% IV SOLN
2.0000 g | INTRAVENOUS | Status: AC
  Administered 2020-12-07: 2 g via INTRAVENOUS
  Filled 2020-12-07: qty 100

## 2020-12-07 MED ORDER — SUGAMMADEX SODIUM 200 MG/2ML IV SOLN
INTRAVENOUS | Status: DC | PRN
  Administered 2020-12-07: 200 mg via INTRAVENOUS

## 2020-12-07 MED ORDER — CHLORHEXIDINE GLUCONATE CLOTH 2 % EX PADS: 6.0000 | MEDICATED_PAD | Freq: Once | CUTANEOUS | Status: DC

## 2020-12-07 MED ORDER — CEFAZOLIN SODIUM-DEXTROSE 2-4 GM/100ML-% IV SOLN
2.0000 g | Freq: Three times a day (TID) | INTRAVENOUS | Status: AC
  Administered 2020-12-07 – 2020-12-08 (×2): 2 g via INTRAVENOUS
  Filled 2020-12-07 (×2): qty 100

## 2020-12-07 MED ORDER — POTASSIUM CHLORIDE IN NACL 20-0.9 MEQ/L-% IV SOLN: INTRAVENOUS | Status: DC

## 2020-12-07 MED ORDER — ONDANSETRON HCL 4 MG/2ML IJ SOLN
INTRAMUSCULAR | Status: DC | PRN
  Administered 2020-12-07: 4 mg via INTRAVENOUS

## 2020-12-07 MED ORDER — MORPHINE SULFATE (PF) 2 MG/ML IV SOLN
2.0000 mg | INTRAVENOUS | Status: DC | PRN
  Administered 2020-12-07: 2 mg via INTRAVENOUS
  Filled 2020-12-07: qty 1

## 2020-12-07 MED ORDER — METHOCARBAMOL 500 MG PO TABS
500.0000 mg | ORAL_TABLET | Freq: Four times a day (QID) | ORAL | Status: DC | PRN
  Administered 2020-12-07 – 2020-12-08 (×2): 500 mg via ORAL
  Filled 2020-12-07 (×3): qty 1

## 2020-12-07 MED ORDER — PIOGLITAZONE HCL 30 MG PO TABS
45.0000 mg | ORAL_TABLET | Freq: Every day | ORAL | Status: DC
  Filled 2020-12-07: qty 1

## 2020-12-07 MED ORDER — SODIUM CHLORIDE 0.9 % IV SOLN: 250.0000 mL | INTRAVENOUS | Status: DC

## 2020-12-07 MED ORDER — MIDAZOLAM HCL 2 MG/2ML IJ SOLN
INTRAMUSCULAR | Status: AC
  Filled 2020-12-07: qty 2

## 2020-12-07 MED ORDER — THROMBIN 5000 UNITS EX SOLR
CUTANEOUS | Status: AC
  Filled 2020-12-07: qty 5000

## 2020-12-07 MED ORDER — LIDOCAINE 2% (20 MG/ML) 5 ML SYRINGE
INTRAMUSCULAR | Status: AC
  Filled 2020-12-07: qty 5

## 2020-12-07 MED ORDER — METFORMIN HCL ER 500 MG PO TB24
1000.0000 mg | ORAL_TABLET | Freq: Two times a day (BID) | ORAL | Status: DC
  Administered 2020-12-08: 1000 mg via ORAL
  Filled 2020-12-07: qty 2

## 2020-12-07 MED ORDER — ROCURONIUM BROMIDE 10 MG/ML (PF) SYRINGE
PREFILLED_SYRINGE | INTRAVENOUS | Status: DC | PRN
  Administered 2020-12-07: 60 mg via INTRAVENOUS
  Administered 2020-12-07: 10 mg via INTRAVENOUS

## 2020-12-07 MED ORDER — CARVEDILOL 6.25 MG PO TABS
6.2500 mg | ORAL_TABLET | Freq: Two times a day (BID) | ORAL | Status: DC
  Administered 2020-12-07: 6.25 mg via ORAL
  Filled 2020-12-07: qty 1

## 2020-12-07 MED ORDER — ALBUMIN HUMAN 5 % IV SOLN: INTRAVENOUS | Status: DC | PRN

## 2020-12-07 MED ORDER — GABAPENTIN 300 MG PO CAPS
300.0000 mg | ORAL_CAPSULE | ORAL | Status: AC
  Administered 2020-12-07: 300 mg via ORAL
  Filled 2020-12-07: qty 1

## 2020-12-07 MED ORDER — PANTOPRAZOLE SODIUM 40 MG PO TBEC
40.0000 mg | DELAYED_RELEASE_TABLET | Freq: Every day | ORAL | Status: DC
  Administered 2020-12-08: 40 mg via ORAL
  Filled 2020-12-07: qty 1

## 2020-12-07 MED ORDER — ONDANSETRON HCL 4 MG/2ML IJ SOLN: 4.0000 mg | Freq: Once | INTRAMUSCULAR | Status: DC | PRN

## 2020-12-07 MED ORDER — PHENOL 1.4 % MT LIQD
1.0000 | OROMUCOSAL | Status: DC | PRN
  Filled 2020-12-07: qty 177

## 2020-12-07 MED ORDER — SENNA 8.6 MG PO TABS
1.0000 | ORAL_TABLET | Freq: Two times a day (BID) | ORAL | Status: DC
  Filled 2020-12-07: qty 1

## 2020-12-07 MED ORDER — REPAGLINIDE 1 MG PO TABS
2.0000 mg | ORAL_TABLET | Freq: Three times a day (TID) | ORAL | Status: DC
  Administered 2020-12-08: 2 mg via ORAL
  Filled 2020-12-07: qty 1
  Filled 2020-12-07 (×2): qty 2

## 2020-12-07 MED ORDER — ONDANSETRON HCL 4 MG/2ML IJ SOLN
INTRAMUSCULAR | Status: AC
  Filled 2020-12-07: qty 2

## 2020-12-07 MED ORDER — PHENYLEPHRINE HCL-NACL 10-0.9 MG/250ML-% IV SOLN
INTRAVENOUS | Status: DC | PRN
  Administered 2020-12-07: 50 ug/min via INTRAVENOUS

## 2020-12-07 MED ORDER — SODIUM CHLORIDE 0.9% FLUSH: 3.0000 mL | INTRAVENOUS | Status: DC | PRN

## 2020-12-07 SURGICAL SUPPLY — 60 items
BAG COUNTER SPONGE SURGICOUNT (BAG) ×2 IMPLANT
BAG SURGICOUNT SPONGE COUNTING (BAG) ×1
BASKET BONE COLLECTION (BASKET) ×3 IMPLANT
BENZOIN TINCTURE PRP APPL 2/3 (GAUZE/BANDAGES/DRESSINGS) ×3 IMPLANT
BLADE CLIPPER SURG (BLADE) IMPLANT
BUR CARBIDE MATCH 3.0 (BURR) ×3 IMPLANT
CANISTER SUCT 3000ML PPV (MISCELLANEOUS) ×3 IMPLANT
CLOSURE WOUND 1/2 X4 (GAUZE/BANDAGES/DRESSINGS) ×1
CNTNR URN SCR LID CUP LEK RST (MISCELLANEOUS) ×1 IMPLANT
CONT SPEC 4OZ STRL OR WHT (MISCELLANEOUS) ×3
COVER BACK TABLE 60X90IN (DRAPES) ×3 IMPLANT
DERMABOND ADVANCED (GAUZE/BANDAGES/DRESSINGS) ×2
DERMABOND ADVANCED .7 DNX12 (GAUZE/BANDAGES/DRESSINGS) ×1 IMPLANT
DRAPE C-ARM 42X72 X-RAY (DRAPES) ×6 IMPLANT
DRAPE LAPAROTOMY 100X72X124 (DRAPES) ×3 IMPLANT
DRAPE SURG 17X23 STRL (DRAPES) ×3 IMPLANT
DRSG OPSITE POSTOP 4X6 (GAUZE/BANDAGES/DRESSINGS) ×3 IMPLANT
DURAPREP 26ML APPLICATOR (WOUND CARE) ×3 IMPLANT
ELECT REM PT RETURN 9FT ADLT (ELECTROSURGICAL) ×3
ELECTRODE REM PT RTRN 9FT ADLT (ELECTROSURGICAL) ×1 IMPLANT
EVACUATOR 1/8 PVC DRAIN (DRAIN) ×3 IMPLANT
GAUZE 4X4 16PLY ~~LOC~~+RFID DBL (SPONGE) IMPLANT
GLOVE SURG ENC MOIS LTX SZ7 (GLOVE) IMPLANT
GLOVE SURG ENC MOIS LTX SZ8 (GLOVE) ×6 IMPLANT
GLOVE SURG UNDER POLY LF SZ7 (GLOVE) IMPLANT
GOWN STRL REUS W/ TWL LRG LVL3 (GOWN DISPOSABLE) IMPLANT
GOWN STRL REUS W/ TWL XL LVL3 (GOWN DISPOSABLE) ×2 IMPLANT
GOWN STRL REUS W/TWL 2XL LVL3 (GOWN DISPOSABLE) IMPLANT
GOWN STRL REUS W/TWL LRG LVL3 (GOWN DISPOSABLE)
GOWN STRL REUS W/TWL XL LVL3 (GOWN DISPOSABLE) ×6
HEMOSTAT POWDER KIT SURGIFOAM (HEMOSTASIS) IMPLANT
KIT BASIN OR (CUSTOM PROCEDURE TRAY) ×3 IMPLANT
KIT BONE MRW ASP ANGEL CPRP (KITS) IMPLANT
KIT TURNOVER KIT B (KITS) ×3 IMPLANT
MATRIX SPINE STRIP NEOCORE 5CC (Putty) ×1 IMPLANT
MILL MEDIUM DISP (BLADE) ×3 IMPLANT
NEEDLE HYPO 25X1 1.5 SAFETY (NEEDLE) ×3 IMPLANT
NS IRRIG 1000ML POUR BTL (IV SOLUTION) ×3 IMPLANT
PACK LAMINECTOMY NEURO (CUSTOM PROCEDURE TRAY) ×3 IMPLANT
PAD ARMBOARD 7.5X6 YLW CONV (MISCELLANEOUS) ×9 IMPLANT
PUTTY BONE 2.5CC (Putty) ×3 IMPLANT
ROD LORD LIP TI 5.5X30 (Rod) ×6 IMPLANT
SCREW CORT SHANK MOD 6.5X40 (Screw) ×6 IMPLANT
SCREW MOD INVICTUS 6.5X40 (Screw) ×6 IMPLANT
SCREW POLYAXIAL TULIP (Screw) ×12 IMPLANT
SET SCREW (Screw) ×12 IMPLANT
SET SCREW SPNE (Screw) ×4 IMPLANT
SPACER IDENTITI PS 5D 8X9X25 (Spacer) ×6 IMPLANT
SPONGE SURGIFOAM ABS GEL 100 (HEMOSTASIS) ×3 IMPLANT
SPONGE T-LAP 4X18 ~~LOC~~+RFID (SPONGE) IMPLANT
STRIP CLOSURE SKIN 1/2X4 (GAUZE/BANDAGES/DRESSINGS) ×2 IMPLANT
STRIP MATRIX NEOCORE 5CC (Putty) ×2 IMPLANT
SUT VIC AB 0 CT1 18XCR BRD8 (SUTURE) ×1 IMPLANT
SUT VIC AB 0 CT1 8-18 (SUTURE) ×3
SUT VIC AB 2-0 CP2 18 (SUTURE) ×3 IMPLANT
SUT VIC AB 3-0 SH 8-18 (SUTURE) ×6 IMPLANT
TOWEL GREEN STERILE (TOWEL DISPOSABLE) ×3 IMPLANT
TOWEL GREEN STERILE FF (TOWEL DISPOSABLE) ×3 IMPLANT
TRAY FOLEY MTR SLVR 16FR STAT (SET/KITS/TRAYS/PACK) ×3 IMPLANT
WATER STERILE IRR 1000ML POUR (IV SOLUTION) ×3 IMPLANT

## 2020-12-07 NOTE — Transfer of Care (Signed)
Immediate Anesthesia Transfer of Care Note  Patient: Luis Lindsey  Procedure(s) Performed: Posterior Lumbar Interbody Fusion Lumbar four-five (Back)  Patient Location: PACU  Anesthesia Type:General  Level of Consciousness: awake, alert  and oriented  Airway & Oxygen Therapy: Patient connected to nasal cannula oxygen  Post-op Assessment: Report given to RN and Post -op Vital signs reviewed and stable  Post vital signs: Reviewed and stable  Last Vitals:  Vitals Value Taken Time  BP 144/59 12/07/20 1558  Temp    Pulse 57 12/07/20 1602  Resp 18 12/07/20 1602  SpO2 100 % 12/07/20 1602  Vitals shown include unvalidated device data.  Last Pain:  Vitals:   12/07/20 1122  TempSrc:   PainSc: 2          Complications: No notable events documented.

## 2020-12-07 NOTE — Op Note (Signed)
12/07/2020  4:08 PM  PATIENT:  Luis Lindsey  70 y.o. 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 Decompressive lumbar laminectomy, hemi facetectomy and foraminotomies L4-5 requiring more work than would be required for a simple exposure of the disk for PLIF in order to adequately decompress the neural elements and address the spinal stenosis 2. Posterior lumbar interbody fusion L4-5 using PTI interbody cages packed with morcellized allograft and autograft  3. Posterior fixation L4-5 using Alphatec cortical pedicle screws.  4. Intertransverse arthrodesis L4-5 using morcellized autograft and allograft.  SURGEON:  Sherley Bounds, MD  ASSISTANTS: Glenford Peers FNP  ANESTHESIA:  General  EBL: 175 ml  Total I/O In: 1950 [I.V.:1700; IV Piggyback:250] Out: 500 [Urine:325; Blood:175]  BLOOD ADMINISTERED:none  DRAINS: none   INDICATION FOR PROCEDURE: This patient presented with severe left leg pain foot drop. Imaging revealed recurrent disc herniation L4-5 left. The patient tried a reasonable attempt at conservative medical measures without relief. I recommended decompression and instrumented fusion to address the stenosis as well as the segmental  instability.  Patient understood the risks, benefits, and alternatives and potential outcomes and wished to proceed.  PROCEDURE DETAILS:  The patient was brought to the operating room. After induction of generalized endotracheal anesthesia the patient was rolled into the prone position on chest rolls and all pressure points were padded. The patient's lumbar region was cleaned and then prepped with DuraPrep and draped in the usual sterile fashion. Anesthesia was injected and then a dorsal midline incision was made and carried down to the lumbosacral fascia. The fascia was opened and the paraspinous musculature was taken down in  a subperiosteal fashion to expose L4-5. A self-retaining retractor was placed. Intraoperative fluoroscopy confirmed my level, and I started with placement of the L4 cortical pedicle screws. The pedicle screw entry zones were identified utilizing surface landmarks and  AP and lateral fluoroscopy. I scored the cortex with the high-speed drill and then used the hand drill to drill an upward and outward direction into the pedicle. I then tapped line to line. I then placed a 6.5 x 40 mm cortical pedicle screw into the pedicles of L4 bilaterally.    I then turned my attention to the decompression and complete lumbar laminectomies, hemi- facetectomies, and foraminotomies were performed at L4-5.  My nurse practitioner was directly involved in the decompression and exposure of the neural elements. the patient had significant spinal stenosis and this required more work than would be required for a simple exposure of the disc for posterior lumbar interbody fusion which would only require a limited laminotomy. Much more generous decompression and generous foraminotomy was undertaken in order to adequately decompress the neural elements and address the patient's leg pain. The yellow ligament was removed to expose the underlying dura and nerve roots, and generous foraminotomies were performed to adequately decompress the neural elements. Both the exiting and traversing nerve roots were decompressed on both sides until a coronary dilator passed easily along the nerve roots. Once the decompression was complete, I turned my attention to the posterior lower lumbar interbody fusion. The epidural venous vasculature was coagulated and cut sharply. Disc space was incised and the initial discectomy was performed with pituitary rongeurs. The disc space was distracted with sequential distractors to a height of 8 mm. We then used a series of scrapers and shavers to prepare the endplates for fusion. The  midline was prepared with Epstein  curettes. Once the complete discectomy was finished, we packed an appropriate sized interbody cage with local autograft and morcellized allograft, gently retracted the nerve root, and tapped the cage into position at L4-5.  The midline between the cages was packed with morselized autograft and allograft.   We then turned our attention to the placement of the lower pedicle screws. The pedicle screw entry zones were identified utilizing surface landmarks and fluoroscopy. I drilled into each pedicle utilizing the hand drill, and tapped each pedicle with the appropriate tap. We palpated with a ball probe to assure no break in the cortex. We then placed 6.5 x 40 mm pedicle screw into the pedicles bilaterally at L5.  My nurse practitioner assisted in placement of the pedicle screws.  We then decorticated the transverse processes and laid a mixture of morcellized autograft and allograft out over these to perform intertransverse arthrodesis at L4-5. We then placed lordotic rods into the multiaxial screw heads of the pedicle screws and locked these in position with the locking caps and anti-torque device. We then checked our construct with AP and lateral fluoroscopy. Irrigated with copious amounts of bacitracin-containing saline solution. Inspected the nerve roots once again to assure adequate decompression, lined to the dura with Gelfoam,  and then we closed the muscle and the fascia with 0 Vicryl. Closed the subcutaneous tissues with 2-0 Vicryl and subcuticular tissues with 3-0 Vicryl. The skin was closed with benzoin and Steri-Strips. Dressing was then applied, the patient was awakened from general anesthesia and transported to the recovery room in stable condition. At the end of the procedure all sponge, needle and instrument counts were correct.   PLAN OF CARE: admit to inpatient  PATIENT DISPOSITION:  PACU - hemodynamically stable.   Delay start of Pharmacological VTE agent (>24hrs) due to surgical blood loss  or risk of bleeding:  yes

## 2020-12-07 NOTE — H&P (Signed)
Subjective: Patient is a 70 y.o. male admitted for recurrent HNP. Onset of symptoms was several months ago, gradually worsening since that time.  The pain is rated severe, and is located at the across the lower back and radiates to leg. The pain is described as aching and occurs all day. The symptoms have been progressive. Symptoms are exacerbated by exercise and standing. MRI or CT showed recurrent HNP   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/2009   Dysphagia    Esophageal dysmotility    GERD (gastroesophageal reflux disease)    Heart murmur    History of hiatal hernia    History of Holter monitoring 2009   History of kidney stones    HNP (herniated nucleus pulposus), lumbar    L4-5   HYPERCHOLESTEROLEMIA 11/30/2009   Hyperlipidemia    HYPERTENSION 11/30/2009   Kidney stones    Numbness and tingling    Peripheral vascular disease ([place])    Right carotid bruit    SMOKER 11/30/2009   Stroke ([place]) 2012   URINARY CALCULUS 11/30/2009    Past Surgical History:  Procedure Laterality Date   ABDOMINAL AORTOGRAM W/LOWER EXTREMITY N/A 06/18/2018   Procedure: ABDOMINAL AORTOGRAM W/LOWER EXTREMITY;  Surgeon: Marty Heck, MD;  Location: [facility];  Service: Cardiovascular;  Laterality: N/A;   ABDOMINAL AORTOGRAM W/LOWER EXTREMITY N/A 12/02/2019   Procedure: ABDOMINAL AORTOGRAM W/LOWER EXTREMITY;  Surgeon: Marty Heck, MD;  Location: [facility];  Service: Cardiovascular;  Laterality: N/A;   arthroscopy of right shoulder Right    Jan 2019, June 2019-- done at [place], Dr. Ninfa Linden   BACK SURGERY     CATARACT EXTRACTION W/ INTRAOCULAR LENS  IMPLANT, BILATERAL     CYSTOSCOPY     ENDARTERECTOMY Right 08/24/2019   Procedure: ENDARTERECTOMY CAROTID;  Surgeon: Marty Heck, MD;  Location: [facility] [place] [facility];  Service: Vascular;  Laterality: Right;   FACIAL COSMETIC SURGERY  2014   FINGER SURGERY  2002   LUMBAR LAMINECTOMY/DECOMPRESSION MICRODISCECTOMY Left 08/01/2018   Procedure: Microdiscectomy - Lumbar four-Lumbar five - left;  Surgeon: Eustace Moore, MD;  Location: [facility];  Service: Neurosurgery;  Laterality: Left;   LUMBAR LAMINECTOMY/DECOMPRESSION MICRODISCECTOMY Left 10/15/2018   Procedure: Re-do Microdiscectomy - left - Lumbar four-Lumbar five;  Surgeon: Eustace Moore, MD;  Location: [place] [facility];  Service: Neurosurgery;  Laterality: Left;   NECK SURGERY  1986   PERIPHERAL VASCULAR ATHERECTOMY  12/02/2019   Procedure: PERIPHERAL VASCULAR ATHERECTOMY;  Surgeon: Marty Heck, MD;  Location: [facility];  Service: Cardiovascular;;  left SFA   PERIPHERAL VASCULAR INTERVENTION  06/18/2018   Procedure: PERIPHERAL VASCULAR INTERVENTION;  Surgeon: Marty Heck, MD;  Location: [facility];  Service: Cardiovascular;;  Left external iliac   ROTATOR CUFF REPAIR Left    UMBILICAL HERNIA REPAIR      Prior to Admission medications   Medication Sig Start Date End Date Taking? Authorizing Provider  amLODipine-benazepril (LOTREL) 10-40 MG per capsule Take 1 capsule by mouth daily.   Yes [provider]  carvedilol (COREG) 6.25 MG tablet Take 1 tablet (6.25 mg total) by mouth 2 (two) times daily. 06/29/20  Yes Freada Bergeron, MD  clopidogrel (PLAVIX) 75 MG  tablet Take 1 tablet (75 mg total) by mouth daily. 09/08/20  Yes Marty Heck, MD  ferrous sulfate 325 (65 FE) MG tablet Take 325 mg by mouth daily with breakfast.   Yes [provider]  insulin degludec (TRESIBA FLEXTOUCH) 100 UNIT/ML FlexTouch Pen Inject 50 Units into the skin daily.   Yes [provider]  metFORMIN (GLUCOPHAGE-XR) 500 MG 24 hr tablet Take 1,000 mg by mouth 2 (two) times daily. 08/20/18  Yes [provider]  pantoprazole (PROTONIX) 40 MG tablet Take  40 mg by mouth daily with breakfast.  07/02/18  Yes [provider]  repaglinide (PRANDIN) 2 MG tablet Take 2 mg by mouth 3 (three) times daily before meals.  09/17/18  Yes [provider]  rosuvastatin (CRESTOR) 40 MG tablet Take 40 mg by mouth daily.    Yes [provider]  aspirin EC 81 MG EC tablet Take 1 tablet (81 mg total) by mouth daily at 6 (six) AM. Patient not taking: Reported on 11/30/2020 08/26/19   Dagoberto Ligas, PA-C  ONETOUCH VERIO test strip 1 each 3 (three) times daily. 10/29/19   [provider]  pioglitazone (ACTOS) 45 MG tablet TAKE 1 TABLET DAILY (NEED APPOINTMENT FOR FURTHER REFILLS) Patient taking differently: Take 45 mg by mouth daily. 04/28/14   Renato Shin, MD   Allergies  Allergen Reactions   Codeine Nausea Only    Social History   Tobacco Use   Smoking status: Former    Years: 40.00    Pack years: 0.00    Types: Cigarettes    Quit date: 12/2015    Years since quitting: 4.9   Smokeless tobacco: Never   Tobacco comments:    Quit 12-2012  Substance Use Topics   Alcohol use: No    Family History  Problem Relation Age of Onset   Diabetes Sister    Diabetes Mellitus I Sister    Stroke Father    Heart disease Father    CVA Father    Coronary artery disease Father    Diabetes Mother    Heart disease Mother    Diabetes Mellitus I Mother    Coronary artery disease Mother    Coronary artery disease Brother    Heart disease Brother    Coronary artery disease Brother      Review of Systems  Positive ROS: neg  All other systems have been reviewed and were otherwise negative with the exception of those mentioned in the HPI and as above.  Objective: Vital signs in last 24 hours: Temp:  [97.8 F (36.6 C)] 97.8 F (36.6 C) (07/13 1057) Pulse Rate:  [60] 60 (07/13 1057) Resp:  [20] 20 (07/13 1057) BP: (152)/(48) 152/48 (07/13 1057) SpO2:  [100 %] 100 % (07/13 1057) Weight:  [84.8 kg] 84.8 kg (07/13 1057)  General  Appearance: Alert, cooperative, no distress, appears stated age Head: Normocephalic, without obvious abnormality, atraumatic Eyes: PERRL, conjunctiva/corneas clear, EOM's intact    Neck: Supple, symmetrical, trachea midline Back: Symmetric, no curvature, ROM normal, no CVA tenderness Lungs:  respirations unlabored Heart: Regular rate and rhythm Abdomen: Soft, non-tender Extremities: Extremities normal, atraumatic, no cyanosis or edema Pulses: 2+ and symmetric all extremities Skin: Skin color, texture, turgor normal, no rashes or lesions  NEUROLOGIC:   Mental status: Alert and oriented x4,  no aphasia, good attention span, fund of knowledge, and memory Motor Exam - grossly normal except for partial foot drop Sensory Exam - grossly normal Reflexes: trace Coordination -  grossly normal Gait - grossly normal Balance - grossly normal Cranial Nerves: I: smell Not tested  II: visual acuity  OS: nl    OD: nl  II: visual fields Full to confrontation  II: pupils Equal, round, reactive to light  III,VII: ptosis None  III,IV,VI: extraocular muscles  Full ROM  V: mastication Normal  V: facial light touch sensation  Normal  V,VII: corneal reflex  Present  VII: facial muscle function - upper  Normal  VII: facial muscle function - lower Normal  VIII: hearing Not tested  IX: soft palate elevation  Normal  IX,X: gag reflex Present  XI: trapezius strength  5/5  XI: sternocleidomastoid strength 5/5  XI: neck flexion strength  5/5  XII: tongue strength  Normal    Data Review Lab Results  Component Value Date   WBC 6.1 12/05/2020   HGB 13.1 12/05/2020   HCT 40.4 12/05/2020   MCV 88.2 12/05/2020   PLT 262 12/05/2020   Lab Results  Component Value Date   NA 135 12/05/2020   K 4.1 12/05/2020   CL 107 12/05/2020   CO2 20 (L) 12/05/2020   BUN 14 12/05/2020   CREATININE 0.96 12/05/2020   GLUCOSE 315 (H) 12/05/2020   Lab Results  Component Value Date   INR 1.0 12/05/2020     Assessment/Plan:  Estimated body mass index is 26.82 kg/m as calculated from the following:   Height as of this encounter: 5\' 10"  (1.778 m).   Weight as of this encounter: 84.8 kg. Patient admitted for plif L4-5. Patient has failed a reasonable attempt at conservative therapy.  I explained the condition and procedure to the patient and answered any questions.  Patient wishes to proceed with procedure as planned. Understands risks/ benefits and typical outcomes of procedure.   Eustace Moore 12/07/2020 12:21 PM

## 2020-12-07 NOTE — Anesthesia Procedure Notes (Addendum)
Procedure Name: Intubation Date/Time: 12/07/2020 1:21 PM Performed by: Eligha Bridegroom, CRNA Pre-anesthesia Checklist: Patient identified, Emergency Drugs available, Suction available, Patient being monitored and Timeout performed Patient Re-evaluated:Patient Re-evaluated prior to induction Oxygen Delivery Method: Circle system utilized Preoxygenation: Pre-oxygenation with 100% oxygen Induction Type: IV induction Ventilation: Oral airway inserted - appropriate to patient size and Mask ventilation without difficulty Laryngoscope Size: Mac and 4 Grade View: Grade I Tube type: Oral Tube size: 7.5 mm Airway Equipment and Method: Stylet Placement Confirmation: ETT inserted through vocal cords under direct vision and positive ETCO2 Secured at: 21 cm Tube secured with: Tape Dental Injury: Teeth and Oropharynx as per pre-operative assessment

## 2020-12-08 DIAGNOSIS — M5116 Intervertebral disc disorders with radiculopathy, lumbar region: Secondary | ICD-10-CM | POA: Diagnosis not present

## 2020-12-08 LAB — GLUCOSE, CAPILLARY: Glucose-Capillary: 157 mg/dL — ABNORMAL HIGH (ref 70–99)

## 2020-12-08 MED ORDER — METHOCARBAMOL 500 MG PO TABS: 500.0000 mg | ORAL_TABLET | Freq: Four times a day (QID) | ORAL | 1 refills | Status: DC | PRN

## 2020-12-08 MED ORDER — OXYCODONE HCL 10 MG PO TABS: 10.0000 mg | ORAL_TABLET | Freq: Four times a day (QID) | ORAL | 0 refills | Status: DC | PRN

## 2020-12-08 NOTE — Evaluation (Signed)
Occupational Therapy Evaluation Patient Details Name: Luis Lindsey MRN: [id number] DOB: 05-25-1951 Today's Date: 12/08/2020    History of Present Illness Pt is a 70 yr old male who has had increase in pain in lower back and LLE due to disc herniation L4-5 .  Pt s/p  Posterior lumbar interbody fusion L4-5 . PMH is not limited to: anemia, DM, heart murmur, HNP, vascular disease,stroke (2021), back sx, rotator cuff repair   Clinical Impression   Pt is post posterior lumbar interbody fusion L4-5. Pt reports they live in a single level home with their wife. Pt reports they have adaptive equipment in the home already to use. Pt requires cues on how to complete log roll from supine to sitting. Pt required supervision for transfers and UE/LE dressing to follow precautions. Pt did require min guard with mobility especially with turns due to LLE reporting decrease in sensation distally to ankle and some muscle weakness to clear the floor. Pt currently with functional limitations due to the deficits listed below (see OT Problem List).  Pt will benefit from skilled acute OT to increase their safety and independence with ADL and functional mobility for ADL to facilitate discharge to venue listed below.      Follow Up Recommendations  Supervision - Intermittent (OP PT)    Equipment Recommendations       Recommendations for Other Services       Precautions / Restrictions Precautions Precautions: Back Precaution Booklet Issued: Yes (comment) Precaution Comments: Pt unable to voice at the start of session Required Braces or Orthoses: Spinal Brace Spinal Brace: Lumbar corset Restrictions Weight Bearing Restrictions: No      Mobility Bed Mobility Overal bed mobility: Needs Assistance Bed Mobility: Rolling;Supine to Sit Rolling: Supervision   Supine to sit: Supervision     General bed mobility comments: pt required cued for bed mobility to be able to follow precautions    Transfers Overall  transfer level: Needs assistance   Transfers: Sit to/from Stand Sit to Stand: Supervision         General transfer comment: cues for hand placement    Balance Overall balance assessment: Needs assistance Sitting-balance support: Feet supported Sitting balance-Leahy Scale: Normal     Standing balance support: No upper extremity supported Standing balance-Leahy Scale: Good                 High Level Balance Comments: with mobility requires min guard due to LLE           ADL either performed or assessed with clinical judgement   ADL Overall ADL's : Needs assistance/impaired Eating/Feeding: Independent;Sitting   Grooming: Wash/dry hands;Wash/dry face;Supervision/safety;Standing;Cueing for safety;Cueing for sequencing   Upper Body Bathing: Supervision/ safety;Cueing for safety;Cueing for sequencing;Standing   Lower Body Bathing: Supervison/ safety;Cueing for safety;Cueing for sequencing;Sit to/from stand   Upper Body Dressing : Supervision/safety;Cueing for safety;Cueing for sequencing;Sitting;Standing   Lower Body Dressing: Supervision/safety;Sit to/from stand;Cueing for safety;Cueing for sequencing   Toilet Transfer: Ambulation;Cueing for safety;Cueing for sequencing;Supervision/safety   Toileting- Clothing Manipulation and Hygiene: Supervision/safety;Cueing for safety;Cueing for sequencing;Sit to/from stand   Tub/ Shower Transfer: Min guard;Cueing for safety;Cueing for sequencing;Shower seat   Functional mobility during ADLs: Min guard;Cueing for safety;Cueing for sequencing General ADL Comments: Pt required min guard noted due to LLE effecting balance     Vision Baseline Vision/History: No visual deficits Patient Visual Report: No change from baseline       Perception     Praxis  Pertinent Vitals/Pain Pain Assessment: 0-10 Pain Score: 3  Pain Location: sx site Pain Descriptors / Indicators: Aching;Guarding Pain Intervention(s): Limited  activity within patient's tolerance     Hand Dominance Right   Extremity/Trunk Assessment Upper Extremity Assessment Upper Extremity Assessment: Overall WFL for tasks assessed   Lower Extremity Assessment Lower Extremity Assessment: LLE deficits/detail LLE Deficits / Details: decrease in sensation and rotation LLE Sensation: decreased light touch;history of peripheral neuropathy;decreased proprioception   Cervical / Trunk Assessment Cervical / Trunk Assessment: Other exceptions (s/p sx)   Communication Communication Communication: No difficulties   Cognition Arousal/Alertness: Awake/alert Behavior During Therapy: WFL for tasks assessed/performed Overall Cognitive Status: Within Functional Limits for tasks assessed                                     General Comments       Exercises     Shoulder Instructions      Home Living Family/patient expects to be discharged to:: Private residence Living Arrangements: Spouse/significant other Available Help at Discharge: Family Type of Home: House Home Access: Stairs to enter Technical brewer of Steps: 2 Entrance Stairs-Rails: Left Home Layout: One level     Bathroom Shower/Tub: Corporate investment banker: Standard Bathroom Accessibility: Yes How Accessible: Accessible via walker Home Equipment: Cane - single point;Crutches;Grab bars - toilet;Grab bars - tub/shower;Hand held shower head;Adaptive equipment;Wheelchair - Designer, fashion/clothing: Reacher        Prior Functioning/Environment                   OT Problem List: Impaired balance (sitting and/or standing);Decreased knowledge of use of DME or AE;Decreased safety awareness;Pain      OT Treatment/Interventions: Self-care/ADL training;DME and/or AE instruction;Therapeutic activities;Patient/family education;Balance training    OT Goals(Current goals can be found in the care plan section) Acute Rehab OT  Goals Patient Stated Goal: To hopefully go home today OT Goal Formulation: With patient Time For Goal Achievement: 12/16/20 Potential to Achieve Goals: Good ADL Goals Pt Will Perform Tub/Shower Transfer: with modified independence;ambulating;shower seat;grab bars Additional ADL Goal #1: Pt will be able to ambulate with no assisted devices on uneven surfaces with no LOB  OT Frequency: Min 2X/week   Barriers to D/C:            Co-evaluation              AM-PAC OT "6 Clicks" Daily Activity     Outcome Measure Help from another person eating meals?: None Help from another person taking care of personal grooming?: None Help from another person toileting, which includes using toliet, bedpan, or urinal?: None Help from another person bathing (including washing, rinsing, drying)?: A Little Help from another person to put on and taking off regular upper body clothing?: None Help from another person to put on and taking off regular lower body clothing?: None 6 Click Score: 23   End of Session Equipment Utilized During Treatment: Gait belt;Back brace  Activity Tolerance: Patient tolerated treatment well Patient left: in bed;with call bell/phone within reach  OT Visit Diagnosis: Unsteadiness on feet (R26.81);Other abnormalities of gait and mobility (R26.89);Muscle weakness (generalized) (M62.81)                Time: 9563-8756 OT Time Calculation (min): 21 min Charges:  OT General Charges $OT Visit: 1 Visit OT Evaluation $OT Eval Low Complexity: 1 Low  Joeseph Amor OTR/L  Acute Rehab Services  [phone] office number [phone] pager number   Joeseph Amor 12/08/2020, 7:59 AM

## 2020-12-08 NOTE — Anesthesia Postprocedure Evaluation (Signed)
Anesthesia Post Note  Patient: ALOYSUIS RIBAUDO  Procedure(s) Performed: Posterior Lumbar Interbody Fusion Lumbar four-five (Back)     Patient location during evaluation: PACU Anesthesia Type: General Level of consciousness: awake and alert Pain management: pain level controlled Vital Signs Assessment: post-procedure vital signs reviewed and stable Respiratory status: spontaneous breathing, nonlabored ventilation, respiratory function stable and patient connected to nasal cannula oxygen Cardiovascular status: blood pressure returned to baseline, stable and bradycardic Postop Assessment: no apparent nausea or vomiting Anesthetic complications: no   No notable events documented.  Last Vitals:  Vitals:   12/08/20 0401 12/08/20 0708  BP: (!) 135/53 (!) 148/52  Pulse: 60 (!) 54  Resp: 18 18  Temp: 36.6 C 36.7 C  SpO2: 100% 100%    Last Pain:  Vitals:   12/08/20 0920  TempSrc:   PainSc: 3                  Catalina Gravel

## 2020-12-08 NOTE — Discharge Summary (Signed)
Physician Discharge Summary  Patient ID: Luis Lindsey MRN: [id number] DOB/AGE: 1950/07/19 70 y.o.  Admit date: 12/07/2020 Discharge date: 12/08/2020  Admission Diagnoses: recurrentHNP, radiculopathy   Discharge Diagnoses: same   Discharged Condition: good  Hospital Course: The patient was admitted on 12/07/2020 and taken to the operating room where the patient underwent PLIF L4-5. The patient tolerated the procedure well and was taken to the recovery room and then to the floor in stable condition. The hospital course was routine. There were no complications. The wound remained clean dry and intact. Pt had appropriate leg soreness. No complaints of leg pain or new N/T/W. The patient remained afebrile with stable vital signs, and tolerated a regular diet. The patient continued to increase activities, and pain was well controlled with oral pain medications.   Consults: None  Significant Diagnostic Studies:  Results for orders placed or performed during the hospital encounter of 12/07/20  Glucose, capillary  Result Value Ref Range   Glucose-Capillary 107 (H) 70 - 99 mg/dL  Glucose, capillary  Result Value Ref Range   Glucose-Capillary 117 (H) 70 - 99 mg/dL  Glucose, capillary  Result Value Ref Range   Glucose-Capillary 80 70 - 99 mg/dL   Comment 1 Notify RN   Glucose, capillary  Result Value Ref Range   Glucose-Capillary 154 (H) 70 - 99 mg/dL   Comment 1 Notify RN    Comment 2 Document in Chart   Glucose, capillary  Result Value Ref Range   Glucose-Capillary 157 (H) 70 - 99 mg/dL   Comment 1 Notify RN    Comment 2 Document in Chart     Chest 2 View  Result Date: 12/05/2020 CLINICAL DATA:  Preoperative for lumbar spine fusion EXAM: CHEST - 2 VIEW COMPARISON:  06/20/2018 chest radiograph. FINDINGS: Stable cardiomediastinal silhouette with normal heart size. No pneumothorax. No pleural effusion. Lungs appear clear, with no acute consolidative airspace disease and no pulmonary  edema. IMPRESSION: No active cardiopulmonary disease. Electronically Signed   By: Ilona Sorrel M.D.   On: 12/05/2020 13:43   DG Lumbar Spine 2-3 Views  Result Date: 12/07/2020 CLINICAL DATA:  Surgery, elective. Additional history provided: L4-5 PLIF. Provided fluoroscopy time 58 seconds (44.36 mGy). EXAM: LUMBAR SPINE - 2-3 VIEW; DG C-ARM 1-60 MIN COMPARISON:  Lumbar spine MRI 11/05/2020. FINDINGS: AP and lateral view intraoperative fluoroscopic images of the lumbar spine are submitted, 2 images total. The lowest well-formed intervertebral disc space is designated L5-S1. On the provided images, a posterior spinal fusion construct is present at L4-L5 (bilateral pedicle screws and vertical interconnecting rods). Interbody device(s) also present at L4-L5. A vascular stent projects over the left sacroiliac joint. IMPRESSION: Two intraoperative fluoroscopic images of the lumbar spine from L4-L5 posterior fusion, as described. Electronically Signed   By: Kellie Simmering DO   On: 12/07/2020 16:10   DG C-Arm 1-60 Min  Result Date: 12/07/2020 CLINICAL DATA:  Surgery, elective. Additional history provided: L4-5 PLIF. Provided fluoroscopy time 58 seconds (44.36 mGy). EXAM: LUMBAR SPINE - 2-3 VIEW; DG C-ARM 1-60 MIN COMPARISON:  Lumbar spine MRI 11/05/2020. FINDINGS: AP and lateral view intraoperative fluoroscopic images of the lumbar spine are submitted, 2 images total. The lowest well-formed intervertebral disc space is designated L5-S1. On the provided images, a posterior spinal fusion construct is present at L4-L5 (bilateral pedicle screws and vertical interconnecting rods). Interbody device(s) also present at L4-L5. A vascular stent projects over the left sacroiliac joint. IMPRESSION: Two intraoperative fluoroscopic images of the lumbar spine from  L4-L5 posterior fusion, as described. Electronically Signed   By: Kellie Simmering DO   On: 12/07/2020 16:10    Antibiotics:  Anti-infectives (From admission, onward)     Start     Dose/Rate Route Frequency Ordered Stop   12/07/20 2100  ceFAZolin (ANCEF) IVPB 2g/100 mL premix        2 g 200 mL/hr over 30 Minutes Intravenous Every 8 hours 12/07/20 1756 12/08/20 0509   12/07/20 1100  ceFAZolin (ANCEF) IVPB 2g/100 mL premix        2 g 200 mL/hr over 30 Minutes Intravenous On call to O.R. 12/07/20 1052 12/07/20 1300       Discharge Exam: Blood pressure (!) 148/52, pulse (!) 54, temperature 98 F (36.7 C), temperature source Oral, resp. rate 18, height 5\' 10"  (1.778 m), weight 84.8 kg, SpO2 100 %. Neurologic: Grossly normal Dressing dry  Discharge Medications:   Allergies as of 12/08/2020       Reactions   Codeine Nausea Only        Medication List     TAKE these medications    amLODipine-benazepril 10-40 MG capsule Commonly known as: LOTREL Take 1 capsule by mouth daily.   carvedilol 6.25 MG tablet Commonly known as: COREG Take 1 tablet (6.25 mg total) by mouth 2 (two) times daily.   clopidogrel 75 MG tablet Commonly known as: PLAVIX Take 1 tablet (75 mg total) by mouth daily.   ferrous sulfate 325 (65 FE) MG tablet Take 325 mg by mouth daily with breakfast.   metFORMIN 500 MG 24 hr tablet Commonly known as: GLUCOPHAGE-XR Take 1,000 mg by mouth 2 (two) times daily.   methocarbamol 500 MG tablet Commonly known as: ROBAXIN Take 1 tablet (500 mg total) by mouth every 6 (six) hours as needed for muscle spasms.   OneTouch Verio test strip Generic drug: glucose blood 1 each 3 (three) times daily.   Oxycodone HCl 10 MG Tabs Take 1 tablet (10 mg total) by mouth every 6 (six) hours as needed for severe pain ((score 7 to 10)).   pantoprazole 40 MG tablet Commonly known as: PROTONIX Take 40 mg by mouth daily with breakfast.   repaglinide 2 MG tablet Commonly known as: PRANDIN Take 2 mg by mouth 3 (three) times daily before meals.   rosuvastatin 40 MG tablet Commonly known as: CRESTOR Take 40 mg by mouth daily.   Tyler Aas  FlexTouch 100 UNIT/ML FlexTouch Pen Generic drug: insulin degludec Inject 50 Units into the skin daily.       ASK your doctor about these medications    aspirin 81 MG EC tablet Take 1 tablet (81 mg total) by mouth daily at 6 (six) AM.   pioglitazone 45 MG tablet Commonly known as: ACTOS TAKE 1 TABLET DAILY (NEED APPOINTMENT FOR FURTHER REFILLS)               Durable Medical Equipment  (From admission, onward)           Start     Ordered   12/07/20 1757  DME Walker rolling  Once       Question:  Patient needs a walker to treat with the following condition  Answer:  S/P lumbar fusion   12/07/20 1756   12/07/20 1757  DME 3 n 1  Once        12/07/20 1756            Disposition: home   Final Dx: PLIF L4-5  Discharge Instructions  Remove dressing in 72 hours   Complete by: As directed    Call MD for:  difficulty breathing, headache or visual disturbances   Complete by: As directed    Call MD for:  persistant nausea and vomiting   Complete by: As directed    Call MD for:  redness, tenderness, or signs of infection (pain, swelling, redness, odor or green/yellow discharge around incision site)   Complete by: As directed    Call MD for:  severe uncontrolled pain   Complete by: As directed    Call MD for:  temperature >100.4   Complete by: As directed    Diet - low sodium heart healthy   Complete by: As directed    Increase activity slowly   Complete by: As directed           Signed: Eustace Moore 12/08/2020, 8:26 AM

## 2020-12-08 NOTE — Evaluation (Signed)
Physical Therapy Evaluation Patient Details Name: Luis Lindsey MRN: [id number] DOB: 08-Dec-1950 Today's Date: 12/08/2020   History of Present Illness  Pt is a 70 y/o male who presents s/p L4-5 PLIF on 12/07/2020. PMH significant for anemia, DM, heart murmur, vascular disease, stroke (2021), back sx, rotator cuff repair   Clinical Impression  Pt admitted with above diagnosis. At the time of PT eval, pt was able to demonstrate transfers and ambulation with gross supervision for safety. Pt continues to appear mildly antalgic due to decreased sensation and mild foot drop on the LLE, however pt reports it is improved compared to prior to surgery. Pt was educated on precautions, brace application/wearing schedule, appropriate activity progression, and car transfer. Pt currently with functional limitations due to the deficits listed below (see PT Problem List). Pt will benefit from skilled PT to increase their independence and safety with mobility to allow discharge to the venue listed below.      Follow Up Recommendations No PT follow up;Supervision for mobility/OOB    Equipment Recommendations  None recommended by PT    Recommendations for Other Services       Precautions / Restrictions Precautions Precautions: Back Precaution Booklet Issued: Yes (comment) Precaution Comments: Reviewed handout and pt was cued for precautions during functional mobility. Required Braces or Orthoses: Spinal Brace Spinal Brace: Lumbar corset Restrictions Weight Bearing Restrictions: No      Mobility  Bed Mobility Overal bed mobility: Needs Assistance Bed Mobility: Rolling;Supine to Sit Rolling: Supervision   Supine to sit: Supervision     General bed mobility comments: HOB mildly raised and rails lowered to simulate home environment. VC's for optimal log roll technique however no assist required.    Transfers Overall transfer level: Needs assistance Equipment used: None Transfers: Sit to/from  Stand Sit to Stand: Supervision         General transfer comment: VC's for hand placement on seated surface for safety. No assist required but pt mildly unsteady upon first stand.  Ambulation/Gait Ambulation/Gait assistance: Supervision Gait Distance (Feet): 250 Feet Assistive device: None Gait Pattern/deviations: Step-through pattern;Decreased stride length;Trunk flexed;Antalgic Gait velocity: Decreased Gait velocity interpretation: 1.31 - 2.62 ft/sec, indicative of limited community ambulator General Gait Details: VC's for improved posture. Noted coordination deficits in LLE and mild L foot drop. No overt LOB noted.  Stairs Stairs: Yes Stairs assistance: Supervision Stair Management: One rail Right;Step to pattern;Forwards Number of Stairs: 2 General stair comments: VC's for optimal safety. No assist required.  Wheelchair Mobility    Modified Rankin (Stroke Patients Only)       Balance Overall balance assessment: Needs assistance Sitting-balance support: Feet supported Sitting balance-Leahy Scale: Fair     Standing balance support: No upper extremity supported Standing balance-Leahy Scale: Fair                               Pertinent Vitals/Pain Pain Assessment: Faces Faces Pain Scale: Hurts little more Pain Location: Incision site Pain Descriptors / Indicators: Operative site guarding;Sore Pain Intervention(s): Limited activity within patient's tolerance;Monitored during session;Repositioned    Home Living Family/patient expects to be discharged to:: Private residence Living Arrangements: Spouse/significant other Available Help at Discharge: Family Type of Home: House Home Access: Stairs to enter Entrance Stairs-Rails: Left Entrance Stairs-Number of Steps: 2 Home Layout: One level Home Equipment: Cane - single point;Crutches;Grab bars - toilet;Grab bars - tub/shower;Hand held shower head;Adaptive equipment;Wheelchair - Brewing technologist  Prior Function Level of Independence: Independent with assistive device(s)         Comments: Utilized cane, reports foot drop and difficulty with longer distances.     Hand Dominance   Dominant Hand: Right    Extremity/Trunk Assessment   Upper Extremity Assessment Upper Extremity Assessment: Defer to OT evaluation    Lower Extremity Assessment Lower Extremity Assessment: LLE deficits/detail LLE Deficits / Details: decrease in sensation, mild DF strength deficit. LLE Sensation: decreased light touch;history of peripheral neuropathy;decreased proprioception    Cervical / Trunk Assessment Cervical / Trunk Assessment: Other exceptions (s/p sx)  Communication   Communication: No difficulties  Cognition Arousal/Alertness: Awake/alert Behavior During Therapy: WFL for tasks assessed/performed Overall Cognitive Status: Within Functional Limits for tasks assessed                                        General Comments      Exercises     Assessment/Plan    PT Assessment Patient needs continued PT services  PT Problem List Decreased strength;Decreased activity tolerance;Decreased balance;Decreased mobility;Decreased knowledge of use of DME;Decreased safety awareness;Decreased knowledge of precautions;Pain       PT Treatment Interventions DME instruction;Gait training;Stair training;Functional mobility training;Therapeutic activities;Therapeutic exercise;Neuromuscular re-education;Patient/family education    PT Goals (Current goals can be found in the Care Plan section)  Acute Rehab PT Goals Patient Stated Goal: To hopefully go home today PT Goal Formulation: With patient Time For Goal Achievement: 12/15/20 Potential to Achieve Goals: Good    Frequency Min 5X/week   Barriers to discharge        Co-evaluation               AM-PAC PT "6 Clicks" Mobility  Outcome Measure Help needed turning from your back to your side while in a flat bed  without using bedrails?: None Help needed moving from lying on your back to sitting on the side of a flat bed without using bedrails?: A Little Help needed moving to and from a bed to a chair (including a wheelchair)?: A Little Help needed standing up from a chair using your arms (e.g., wheelchair or bedside chair)?: A Little Help needed to walk in hospital room?: A Little Help needed climbing 3-5 steps with a railing? : A Little 6 Click Score: 19    End of Session Equipment Utilized During Treatment: Gait belt;Back brace Activity Tolerance: Patient tolerated treatment well Patient left: in bed;with call bell/phone within reach Nurse Communication: Mobility status PT Visit Diagnosis: Unsteadiness on feet (R26.81);Pain Pain - part of body:  (back)    Time: 9381-8299 PT Time Calculation (min) (ACUTE ONLY): 15 min   Charges:   PT Evaluation $PT Eval Low Complexity: 1 Low          Rolinda Roan, PT, DPT Acute Rehabilitation Services Pager: [phone] Office: [phone]   Thelma Comp 12/08/2020, 11:54 AM

## 2020-12-08 NOTE — Plan of Care (Signed)
°  Problem: Education: °Goal: Ability to verbalize activity precautions or restrictions will improve °Outcome: Adequate for Discharge °Goal: Knowledge of the prescribed therapeutic regimen will improve °Outcome: Adequate for Discharge °Goal: Understanding of discharge needs will improve °Outcome: Adequate for Discharge °  °Problem: Activity: °Goal: Ability to avoid complications of mobility impairment will improve °Outcome: Adequate for Discharge °Goal: Ability to tolerate increased activity will improve °Outcome: Adequate for Discharge °Goal: Will remain free from falls °Outcome: Adequate for Discharge °  °Problem: Bowel/Gastric: °Goal: Gastrointestinal status for postoperative course will improve °Outcome: Adequate for Discharge °  °

## 2021-01-19 DIAGNOSIS — M961 Postlaminectomy syndrome, not elsewhere classified: Secondary | ICD-10-CM | POA: Diagnosis not present

## 2021-01-24 NOTE — Progress Notes (Deleted)
Cardiology Office Note    Date:  01/24/2021   ID:  [name], [name] [birth year], MRN [id number]   PCP:  Aurea Graff.[name], [place]  Cardiologist:  None *** Advanced Practice Provider:  No care team member to display Electrophysiologist:  None   [id number]   No chief complaint on file.   History of Present Illness:  Luis Lindsey is a 70 y.o. male with history of moderate aortic stenosis, hypertension, HLD, tobacco use, PAD status post left SFA atherectomy with angioplasty, left CEA.  He had trouble with relative hypotension in the setting of anemia and HCTZ was stopped and metoprolol decreased to 25 mg daily.  He last saw Dr. Johney Frame 06/2020 complaining of atypical sharp chest pain at rest low suspicion for cardiac etiology.  Blood pressure was elevated at home so metoprolol was changed to Coreg 6.25 mg twice daily.   repeat echo 06/2020-the EF with moderate AS continue to monitor every 2 years with echoes unless symptomatic    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/2009   Dysphagia    Esophageal dysmotility    GERD (gastroesophageal reflux disease)    Heart murmur    History of hiatal hernia    History of Holter monitoring 2009   History of kidney stones    HNP (herniated nucleus pulposus), lumbar    L4-5   HYPERCHOLESTEROLEMIA 11/30/2009   Hyperlipidemia    HYPERTENSION 11/30/2009   Kidney stones    Numbness and tingling    Peripheral vascular disease ([place])    Right carotid bruit    SMOKER 11/30/2009   Stroke ([place]) 2012   URINARY CALCULUS 11/30/2009    Past Surgical History:  Procedure Laterality Date   ABDOMINAL AORTOGRAM W/LOWER EXTREMITY N/A 06/18/2018   Procedure: ABDOMINAL AORTOGRAM W/LOWER EXTREMITY;  Surgeon: Marty Heck, MD;  Location: [facility];  Service: Cardiovascular;  Laterality: N/A;   ABDOMINAL AORTOGRAM W/LOWER EXTREMITY N/A 12/02/2019   Procedure: ABDOMINAL AORTOGRAM W/LOWER EXTREMITY;  Surgeon: Marty Heck, MD;  Location: [facility];  Service: Cardiovascular;  Laterality: N/A;   arthroscopy of right shoulder Right    Jan 2019, June 2019-- done at [facility], Dr. Ninfa Linden   BACK SURGERY     CATARACT EXTRACTION W/ INTRAOCULAR LENS  IMPLANT, BILATERAL     CYSTOSCOPY     ENDARTERECTOMY Right 08/24/2019   Procedure: ENDARTERECTOMY CAROTID;  Surgeon: Marty Heck, MD;  Location: [facility];  Service: Vascular;  Laterality: Right;   FACIAL COSMETIC SURGERY  2014   FINGER SURGERY  2002   LUMBAR LAMINECTOMY/DECOMPRESSION MICRODISCECTOMY Left 08/01/2018   Procedure: Microdiscectomy - Lumbar four-Lumbar five - left;  Surgeon: Eustace Moore, MD;  Location: [facility];  Service: Neurosurgery;  Laterality: Left;   LUMBAR LAMINECTOMY/DECOMPRESSION MICRODISCECTOMY Left 10/15/2018   Procedure: Re-do Microdiscectomy - left - Lumbar four-Lumbar five;  Surgeon: Eustace Moore, MD;  Location: [facility];  Service: Neurosurgery;  Laterality: Left;   NECK SURGERY  1986   PERIPHERAL VASCULAR ATHERECTOMY  12/02/2019   Procedure: PERIPHERAL VASCULAR ATHERECTOMY;  Surgeon: Marty Heck, MD;  Location: [facility];  Service: Cardiovascular;;  left SFA   PERIPHERAL VASCULAR INTERVENTION  06/18/2018   Procedure: PERIPHERAL VASCULAR INTERVENTION;  Surgeon: Marty Heck, MD;  Location: [facility];  Service: Cardiovascular;;  Left external iliac   ROTATOR CUFF REPAIR Left    UMBILICAL HERNIA REPAIR      Current Medications: No outpatient medications have been marked as taking for the 02/01/21 encounter (Appointment) with Imogene Burn, PA-C.     Allergies:   Codeine   Social History   Socioeconomic History   Marital status: Married    Spouse name: Luis Lindsey    Number of children:  3   Years of education: 11   Highest education level: Not on file  Occupational History   Occupation: Truck Education administrator: [employer]  Tobacco Use   Smoking status: Former    Years: 40.00    Types: Cigarettes    Quit date: 12/2015    Years since quitting: 5.0   Smokeless tobacco: Never   Tobacco comments:    Quit 12-2012  Vaping Use   Vaping Use: Never used  Substance and Sexual Activity   Alcohol use: No   Drug use: No   Sexual activity: Not on file  Other Topics Concern   Not on file  Social History Narrative   . Patient drinks 2-3 cups of caffeine daily.    Patient lives at home with his wife [name].    Patient works at [employer].    Education. 12 th grade   Right handed               Social Determinants of Health   Financial Resource Strain: Not on file  Food Insecurity: No Food Insecurity   Worried About Charity fundraiser in the Last Year: Never true   Ran Out of Food in the Last Year: Never true  Transportation Needs: No Transportation Needs   Lack of Transportation (Medical): No   Lack of Transportation (Non-Medical): No  Physical Activity: Not on file  Stress: Not on file  Social Connections: Not on file     Family History:  The patient's ***family history includes CVA in his father; Coronary artery disease in his brother, brother, father, and mother; Diabetes in his mother and sister; Diabetes Mellitus I in his mother and sister; Heart disease in his brother, father, and mother; Stroke in his father.   ROS:   Please see the history of present illness.    ROS All other systems reviewed and are negative.   PHYSICAL EXAM:   VS:  There were no vitals taken for this visit.  Physical Exam  GEN: Well nourished, well developed, in no acute distress  HEENT: normal  Neck: no JVD, carotid bruits, or masses Cardiac:RRR; no murmurs, rubs, or gallops  Respiratory:  clear to auscultation bilaterally, normal work of breathing GI: soft, nontender, nondistended, +  BS Ext: without cyanosis, clubbing, or edema, Good distal pulses bilaterally MS: no deformity or atrophy  Skin: warm and dry, no rash Neuro:  Alert and Oriented x 3, Strength and sensation are intact Psych: euthymic mood, full affect  Wt Readings from Last 3 Encounters:  12/07/20 186 lb 14.4 oz (84.8 kg)  12/05/20 186 lb 14.4 oz (84.8 kg)  07/12/20 170 lb 11.2 oz (77.4 kg)      Studies/Labs Reviewed:   EKG:  EKG is*** ordered today.  The ekg ordered today demonstrates ***  Recent Labs: 12/05/2020: BUN 14; Creatinine, Ser 0.96; Hemoglobin 13.1; Platelets 262; Potassium 4.1; Sodium 135   Lipid Panel  No results found for: CHOL, TRIG, HDL, CHOLHDL, VLDL, LDLCALC, LDLDIRECT  Additional studies/ records that were reviewed today include:   2D echo 06/2020 IMPRESSIONS     1. The aortic valve is tricuspid. There is moderate calcification of the  aortic valve. There is moderate thickening of the aortic valve. Aortic  valve regurgitation is not visualized. Mild to moderate aortic valve  stenosis. Aortic valve area, by VTI  measures 1.33 cm. Aortic valve mean gradient measures 18.0 mmHg. Aortic  valve Vmax measures 2.84 m/s.   2. Left ventricular ejection fraction, by estimation, is 60 to 65%. The  left ventricle has normal function. The left ventricle has no regional  wall motion abnormalities. There is mild concentric left ventricular  hypertrophy. Left ventricular diastolic  parameters are consistent with Grade I diastolic dysfunction (impaired  relaxation).   3. Right ventricular systolic function is normal. The right ventricular  size is normal. Tricuspid regurgitation signal is inadequate for assessing  PA pressure.   4. The mitral valve is grossly normal. Trivial mitral valve  regurgitation. No evidence of mitral stenosis.   5. The inferior vena cava is normal in size with greater than 50%  respiratory variability, suggesting right atrial pressure of 3 mmHg.    Comparison(s): No significant change from prior study. AS remains mild to  moderate when compared with 09/22/2019.   FINDINGS  TTE 09/22/19:  1. Left ventricular ejection fraction, by estimation, is 65 to 70%. The  left ventricle has normal function. The left ventricle has no regional  wall motion abnormalities. There is moderate asymmetric left ventricular  hypertrophy of the basal-septal  segment. Left ventricular diastolic parameters are consistent with Grade I  diastolic dysfunction (impaired relaxation).   2. Right ventricular systolic function is normal. The right ventricular  size is normal.   3. Left atrial size was mildly dilated.   4. The mitral valve is grossly normal. Trivial mitral valve  regurgitation.   5. The aortic valve is tricuspid. Aortic valve regurgitation is not  visualized. Moderate aortic valve stenosis. Aortic valve area, by VTI  measures 1.21 cm. Aortic valve mean gradient measures 14.5 mmHg. Aortic  valve Vmax measures 2.76 m/s.   6. The inferior vena cava is normal in size with greater than 50%  respiratory variability, suggesting right atrial pressure of 3 mmHg.   LLE ultrasound 12/2019 Summary:  Left: 50-74% stenosis noted in the deep femoral artery. 50-74% stenosis  noted in the proximal superficial femoral artery.        Risk Assessment/Calculations:   {Does this patient have ATRIAL FIBRILLATION?[phone]}     ASSESSMENT:    No diagnosis found.   PLAN:  In order of problems listed above:  Moderate aortic stenosis echo 06/2020 AVA by VTI 1.33 cm mean gradient 18 mmHg V-max 2.84 mL seconds.  Plan for repeat echoes every 2 years unless symptomatic.  Hypertension with some hypotension in the setting of anemia.  Metoprolol changed to Coreg last office visit for better blood pressure control  PAD status post left SFA atherectomy and angioplasty followed by VBS on aspirin and Plavix  Carotid disease status post left CEA on rosuvastatin  and aspirin  HLD on rosuvastatin  Shared Decision Making/Informed Consent   {Are you ordering a CV Procedure (e.g. stress test, cath, DCCV, TEE, etc)?   Press F2        :[id number]    Medication Adjustments/Labs and Tests Ordered: Current medicines are reviewed at length with the patient today.  Concerns regarding medicines are outlined above.  Medication changes, Labs and Tests ordered today are listed in the Patient Instructions below. There are no Patient Instructions on file for this visit.   Signed, Ermalinda Barrios, PA-C  01/24/2021 3:17 PM    [facility] [place], [place], [place]  [address] Phone: [phone]; Fax: [phone]

## 2021-01-31 DIAGNOSIS — R109 Unspecified abdominal pain: Secondary | ICD-10-CM | POA: Diagnosis not present

## 2021-01-31 DIAGNOSIS — R42 Dizziness and giddiness: Secondary | ICD-10-CM | POA: Diagnosis not present

## 2021-01-31 DIAGNOSIS — K219 Gastro-esophageal reflux disease without esophagitis: Secondary | ICD-10-CM | POA: Diagnosis not present

## 2021-01-31 DIAGNOSIS — I350 Nonrheumatic aortic (valve) stenosis: Secondary | ICD-10-CM | POA: Diagnosis not present

## 2021-01-31 DIAGNOSIS — Z23 Encounter for immunization: Secondary | ICD-10-CM | POA: Diagnosis not present

## 2021-01-31 DIAGNOSIS — I10 Essential (primary) hypertension: Secondary | ICD-10-CM | POA: Diagnosis not present

## 2021-01-31 DIAGNOSIS — E1142 Type 2 diabetes mellitus with diabetic polyneuropathy: Secondary | ICD-10-CM | POA: Diagnosis not present

## 2021-01-31 DIAGNOSIS — E782 Mixed hyperlipidemia: Secondary | ICD-10-CM | POA: Diagnosis not present

## 2021-02-01 ENCOUNTER — Ambulatory Visit: Payer: HMO | Admitting: Physician Assistant

## 2021-02-01 DIAGNOSIS — I739 Peripheral vascular disease, unspecified: Secondary | ICD-10-CM

## 2021-02-01 DIAGNOSIS — I350 Nonrheumatic aortic (valve) stenosis: Secondary | ICD-10-CM

## 2021-02-01 DIAGNOSIS — I10 Essential (primary) hypertension: Secondary | ICD-10-CM

## 2021-02-01 DIAGNOSIS — E7800 Pure hypercholesterolemia, unspecified: Secondary | ICD-10-CM

## 2021-02-01 DIAGNOSIS — I6522 Occlusion and stenosis of left carotid artery: Secondary | ICD-10-CM

## 2021-02-14 ENCOUNTER — Ambulatory Visit (HOSPITAL_COMMUNITY)
Admission: RE | Admit: 2021-02-14 | Discharge: 2021-02-14 | Disposition: A | Discharge status: Home / Self Care | Payer: HMO | Source: Ambulatory Visit | Attending: Vascular Surgery | Admitting: Vascular Surgery

## 2021-02-14 ENCOUNTER — Ambulatory Visit: Payer: HMO | Admitting: Physician Assistant

## 2021-02-14 ENCOUNTER — Other Ambulatory Visit: Payer: Self-pay

## 2021-02-14 VITALS — BP 147/62 | HR 67 | Temp 98.0°F | Resp 20 | Ht 70.0 in | Wt 164.8 lb

## 2021-02-14 DIAGNOSIS — I739 Peripheral vascular disease, unspecified: Secondary | ICD-10-CM | POA: Insufficient documentation

## 2021-02-14 NOTE — Progress Notes (Signed)
HISTORY AND PHYSICAL     CC:  follow up. Requesting Provider:  Alroy Dust, [name], MD  HPI: This is a 70 y.o. male who is here today for follow up for PAD.  He is s/p atherectomy with angioplasty of left SFA and above-knee popliteal artery on December 02, 2019 by Dr. Carlis Abbott.  He previously had a left external iliac stent for left lower extremity claudication.   He also had right CEA for asymptomatic carotid artery stenosis in March 2021 also by Dr. Carlis Abbott.  Pt was last seen February 2022 and at that time, he was doing well without neuro symptoms or claudication, rest pain or non healing wounds.  On his LLE duplex, he did have areas of stenosis but was asymptomatic and scheduled for 6 month follow up with ABI.  His carotid duplex was 1-39% bilaterally and scheduled for carotid duplex for one year.   The pt returns today for follow up.  He states that over the past 2-3 weeks, he has been having cramping in his left calf when walking.  He states that he would walk his dog around the block prior to this without any issues.  Now  he cannot make it that far and has to stop and rest.  He denies any rest pain or non healing wounds.  He states he has a sore that comes and goes at the base of his left great toe but this has healed.  He has decreased sensation and motor function in his left foot but this did improve some after his intervention last year.  It is about the same since then.    The pt is on a statin for cholesterol management.    The pt is on an aspirin.    Other AC:  Plavix The pt is on ACEI, CCB for hypertension.  The pt does have diabetes. Tobacco hx:  former  Pt does not have family hx of AAA.  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/2009   Dysphagia    Esophageal dysmotility    GERD (gastroesophageal reflux disease)     Heart murmur    History of hiatal hernia    History of Holter monitoring 2009   History of kidney stones    HNP (herniated nucleus pulposus), lumbar    L4-5   HYPERCHOLESTEROLEMIA 11/30/2009   Hyperlipidemia    HYPERTENSION 11/30/2009   Kidney stones    Numbness and tingling    Peripheral vascular disease ([place])    Right carotid bruit    SMOKER 11/30/2009   Stroke ([place]) 2012   URINARY CALCULUS 11/30/2009    Past Surgical History:  Procedure Laterality Date   ABDOMINAL AORTOGRAM W/LOWER EXTREMITY N/A 06/18/2018   Procedure: ABDOMINAL AORTOGRAM W/LOWER EXTREMITY;  Surgeon: Marty Heck, MD;  Location: [facility];  Service: Cardiovascular;  Laterality: N/A;   ABDOMINAL AORTOGRAM W/LOWER EXTREMITY N/A 12/02/2019   Procedure: ABDOMINAL AORTOGRAM W/LOWER EXTREMITY;  Surgeon: Marty Heck, MD;  Location: [facility];  Service: Cardiovascular;  Laterality: N/A;   arthroscopy of right shoulder Right    Jan 2019, June 2019-- done at [facility], Dr. Ninfa Linden   BACK SURGERY     CATARACT EXTRACTION W/ INTRAOCULAR LENS  IMPLANT, BILATERAL  CYSTOSCOPY     ENDARTERECTOMY Right 08/24/2019   Procedure: ENDARTERECTOMY CAROTID;  Surgeon: Marty Heck, MD;  Location: [facility];  Service: Vascular;  Laterality: Right;   FACIAL COSMETIC SURGERY  2014   FINGER SURGERY  2002   LUMBAR LAMINECTOMY/DECOMPRESSION MICRODISCECTOMY Left 08/01/2018   Procedure: Microdiscectomy - Lumbar four-Lumbar five - left;  Surgeon: Eustace Moore, MD;  Location: [facility];  Service: Neurosurgery;  Laterality: Left;   LUMBAR LAMINECTOMY/DECOMPRESSION MICRODISCECTOMY Left 10/15/2018   Procedure: Re-do Microdiscectomy - left - Lumbar four-Lumbar five;  Surgeon: Eustace Moore, MD;  Location: [facility];  Service: Neurosurgery;  Laterality: Left;   NECK SURGERY  1986   PERIPHERAL VASCULAR ATHERECTOMY  12/02/2019   Procedure: PERIPHERAL VASCULAR ATHERECTOMY;  Surgeon: Marty Heck, MD;  Location: [facility];  Service: Cardiovascular;;  left SFA   PERIPHERAL VASCULAR INTERVENTION  06/18/2018   Procedure: PERIPHERAL VASCULAR INTERVENTION;  Surgeon: Marty Heck, MD;  Location: [facility];  Service: Cardiovascular;;  Left external iliac   ROTATOR CUFF REPAIR Left    UMBILICAL HERNIA REPAIR      Allergies  Allergen Reactions   Codeine Nausea Only    Current Outpatient Medications  Medication Sig Dispense Refill   amLODipine-benazepril (LOTREL) 10-40 MG per capsule Take 1 capsule by mouth daily.     aspirin EC 81 MG EC tablet Take 1 tablet (81 mg total) by mouth daily at 6 (six) AM. (Patient not taking: Reported on 11/30/2020)     carvedilol (COREG) 6.25 MG tablet Take 1 tablet (6.25 mg total) by mouth 2 (two) times daily. 180 tablet 3   clopidogrel (PLAVIX) 75 MG tablet Take 1 tablet (75 mg total) by mouth daily. 30 tablet 6   ferrous sulfate 325 (65 FE) MG tablet Take 325 mg by mouth daily with breakfast.     insulin degludec (TRESIBA FLEXTOUCH) 100 UNIT/ML FlexTouch Pen Inject 50 Units into the skin daily.     metFORMIN (GLUCOPHAGE-XR) 500 MG 24 hr tablet Take 1,000 mg by mouth 2 (two) times daily.     methocarbamol (ROBAXIN) 500 MG tablet Take 1 tablet (500 mg total) by mouth every 6 (six) hours as needed for muscle spasms. 60 tablet 1   ONETOUCH VERIO test strip 1 each 3 (three) times daily.     oxyCODONE 10 MG TABS Take 1 tablet (10 mg total) by mouth every 6 (six) hours as needed for severe pain ((score 7 to 10)). 30 tablet 0   pantoprazole (PROTONIX) 40 MG tablet Take 40 mg by mouth daily with breakfast.      pioglitazone (ACTOS) 45 MG tablet TAKE 1 TABLET DAILY (NEED APPOINTMENT FOR FURTHER REFILLS) (Patient taking differently: Take 45 mg by mouth daily.) 90 tablet 0   repaglinide (PRANDIN) 2 MG tablet Take 2 mg by mouth 3 (three) times daily before meals.      rosuvastatin (CRESTOR) 40 MG tablet Take 40 mg by mouth daily.      No  current facility-administered medications for this visit.    Family History  Problem Relation Age of Onset   Diabetes Sister    Diabetes Mellitus I Sister    Stroke Father    Heart disease Father    CVA Father    Coronary artery disease Father    Diabetes Mother    Heart disease Mother    Diabetes Mellitus I Mother    Coronary artery disease Mother    Coronary artery disease  Brother    Heart disease Brother    Coronary artery disease Brother     Social History   Socioeconomic History   Marital status: Married    Spouse name: [name]    Number of children: 3   Years of education: 11   Highest education level: Not on file  Occupational History   Occupation: Truck Education administrator: [employer]  Tobacco Use   Smoking status: Former    Years: 40.00    Types: Cigarettes    Quit date: 12/2015    Years since quitting: 5.1   Smokeless tobacco: Never   Tobacco comments:    Quit 12-2012  Vaping Use   Vaping Use: Never used  Substance and Sexual Activity   Alcohol use: No   Drug use: No   Sexual activity: Not on file  Other Topics Concern   Not on file  Social History Narrative   . Patient drinks 2-3 cups of caffeine daily.    Patient lives at home with his wife [name].    Patient works at [employer].    Education. 12 th grade   Right handed               Social Determinants of Health   Financial Resource Strain: Not on file  Food Insecurity: No Food Insecurity   Worried About Charity fundraiser in the Last Year: Never true   Ran Out of Food in the Last Year: Never true  Transportation Needs: No Transportation Needs   Lack of Transportation (Medical): No   Lack of Transportation (Non-Medical): No  Physical Activity: Not on file  Stress: Not on file  Social Connections: Not on file  Intimate Partner Violence: Not on file     REVIEW OF SYSTEMS:   [X]  denotes positive finding, [ ]  denotes negative finding Cardiac  Comments:  Chest pain or chest pressure:     Shortness of breath upon exertion:    Short of breath when lying flat:    Irregular heart rhythm:        Vascular    Pain in calf, thigh, or hip brought on by ambulation:    Pain in feet at night that wakes you up from your sleep:     Blood clot in your veins:    Leg swelling:         Pulmonary    Oxygen at home:    Productive cough:     Wheezing:         Neurologic    Sudden weakness in arms or legs:     Sudden numbness in arms or legs:     Sudden onset of difficulty speaking or slurred speech:    Temporary loss of vision in one eye:     Problems with dizziness:         Gastrointestinal    Blood in stool:     Vomited blood:         Genitourinary    Burning when urinating:     Blood in urine:        Psychiatric    Major depression:         Hematologic    Bleeding problems:    Problems with blood clotting too easily:        Skin    Rashes or ulcers:        Constitutional    Fever or chills:      PHYSICAL EXAMINATION:  Today's Vitals  02/14/21 0934 02/14/21 0938  BP: (!) 145/57 (!) 147/62  Pulse: 67   Resp: 20   Temp: 98 F (36.7 C)   TempSrc: Temporal   SpO2: 100%   Weight: 164 lb 12.8 oz (74.8 kg)   Height: 5\' 10"  (1.778 m)   PainSc: 8     Body mass index is 23.65 kg/m.   General:  WDWN in NAD; vital signs documented above Gait: Normal HENT: WNL, normocephalic Pulmonary: normal non-labored breathing , without wheezing Cardiac: regular HR, with  Murmur; with carotid bruits bilaterally Abdomen: soft, NT, no masses; aortic pulse is not palpable Skin: without rashes Vascular Exam/Pulses:  Right Left  Radial 2+ (normal) 2+ (normal)  Femoral 2+ (normal) 2+ (normal)  Popliteal Unable to palpate Unable to palpate  AT Brisk monophasic Faint monophasic  PT Brisk monophasic Dampened monophasic  peroneal Brisk monophasic Dampened monophasic   Extremities: without ischemic changes, without Gangrene , without cellulitis; without open wounds;   Musculoskeletal: no muscle wasting or atrophy  Neurologic: A&O X 3;  No focal weakness or paresthesias are detected Psychiatric:  The pt has Normal affect.   Non-Invasive Vascular Imaging:   ABI's/TBI's on 02/14/2021: Right:  0.73/0.41 - Great toe pressure: 73 Left:  0.41/0.23 - Great toe pressure: 41   Previous ABI's/TBI's on 07/18/2020: Right:  0.85/0.7  Left:  0.73/0.49  Previous arterial duplex on 07/12/2020: 50-99% stenosis mid and distal CIA stent (left) Incidental finding of >70% SMA stenosis and >50% IMA stenosis.  50-74% stenosis left SFA proximally and distally and 50-74% stenosis DFA   ASSESSMENT/PLAN:: 70 y.o. male here for follow up for PAD.  He is s/p atherectomy with angioplasty of left SFA and above-knee popliteal artery on December 02, 2019 by Dr. Carlis Abbott.  He previously had a left external iliac stent for left lower extremity claudication.   PAD -pt ABI/TBI significantly decreased from 6 months ago.  He did have narrowing on duplex at last visit but was asymptomatic.  He has now started having claudication about 2-3 weeks ago.  He does not have rest pain or non healing wounds.  Discussed with Dr. Stanford Breed and send pt for CTA with runoff and have him follow up with Dr. Carlis Abbott in the next 2-4 weeks.  Pt knows that if he develops rest pain or wounds, he will call us sooner.   -continue statin/asa/plavix  Carotid bruit bilaterally -pt had duplex 6 months ago that revealed 1-39% ICA stenosis.  He is scheduled for duplex in February.   Leontine Locket, [facility] [phone]  Clinic MD:   Stanford Breed

## 2021-02-15 ENCOUNTER — Other Ambulatory Visit: Payer: Self-pay

## 2021-02-15 DIAGNOSIS — I739 Peripheral vascular disease, unspecified: Secondary | ICD-10-CM

## 2021-03-09 ENCOUNTER — Other Ambulatory Visit: Payer: Self-pay

## 2021-03-09 ENCOUNTER — Ambulatory Visit (HOSPITAL_COMMUNITY)
Admission: RE | Admit: 2021-03-09 | Discharge: 2021-03-09 | Disposition: A | Discharge status: Home / Self Care | Payer: HMO | Source: Ambulatory Visit | Attending: Vascular Surgery | Admitting: Vascular Surgery

## 2021-03-09 DIAGNOSIS — I739 Peripheral vascular disease, unspecified: Secondary | ICD-10-CM

## 2021-03-09 DIAGNOSIS — I743 Embolism and thrombosis of arteries of the lower extremities: Secondary | ICD-10-CM | POA: Diagnosis not present

## 2021-03-09 DIAGNOSIS — I701 Atherosclerosis of renal artery: Secondary | ICD-10-CM | POA: Diagnosis not present

## 2021-03-09 DIAGNOSIS — M7989 Other specified soft tissue disorders: Secondary | ICD-10-CM | POA: Diagnosis not present

## 2021-03-09 DIAGNOSIS — M79662 Pain in left lower leg: Secondary | ICD-10-CM | POA: Diagnosis not present

## 2021-03-09 LAB — POCT I-STAT CREATININE: Creatinine, Ser: 0.8 mg/dL (ref 0.61–1.24)

## 2021-03-09 MED ORDER — IOHEXOL 350 MG/ML SOLN
100.0000 mL | Freq: Once | INTRAVENOUS | Status: AC | PRN
  Administered 2021-03-09: 100 mL via INTRAVENOUS

## 2021-03-14 ENCOUNTER — Encounter: Payer: Self-pay | Admitting: Vascular Surgery

## 2021-03-14 ENCOUNTER — Ambulatory Visit: Payer: HMO | Admitting: Vascular Surgery

## 2021-03-14 ENCOUNTER — Other Ambulatory Visit: Payer: Self-pay

## 2021-03-14 VITALS — BP 159/74 | HR 61 | Temp 97.6°F | Resp 18 | Ht 70.0 in | Wt 172.0 lb

## 2021-03-14 DIAGNOSIS — I739 Peripheral vascular disease, unspecified: Secondary | ICD-10-CM

## 2021-03-14 NOTE — Progress Notes (Signed)
Patient name: Luis Lindsey MRN: [id number] DOB: June 11, 1950 Sex: male  REASON FOR VISIT: Evaluate new left leg claudication  HPI: Luis Lindsey is a 70 y.o. male that presents for evaluation of new left leg claudication.  He states this started in the summertime when he was walking his dog.  Now he is only able to walk about a block and feels this is getting worse on a weekly basis.  He is still taking Plavix and aspirin.  He denies any tobacco abuse.  He was recently seen by the PA was sent for a CTA and then to see me.  He previously underwent left external iliac stent on 06/18/2018 for left lower extremity lifestyle limiting claudication.  He later underwent atherectomy and angioplasty of his left SFA and above-knee popliteal artery on 12/02/2019 including DCB.  He has also previously undergone a right carotid endarterectomy by myself on 08/24/2019.  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/2009   Dysphagia    Esophageal dysmotility    GERD (gastroesophageal reflux disease)    Heart murmur    History of hiatal hernia    History of Holter monitoring 2009   History of kidney stones    HNP (herniated nucleus pulposus), lumbar    L4-5   HYPERCHOLESTEROLEMIA 11/30/2009   Hyperlipidemia    HYPERTENSION 11/30/2009   Kidney stones    Numbness and tingling    Peripheral vascular disease ([place])    Right carotid bruit    SMOKER 11/30/2009   Stroke ([place]) 2012   URINARY CALCULUS 11/30/2009    Past Surgical History:  Procedure Laterality Date   ABDOMINAL AORTOGRAM W/LOWER EXTREMITY N/A 06/18/2018   Procedure: ABDOMINAL AORTOGRAM W/LOWER EXTREMITY;  Surgeon: Marty Heck, MD;  Location: [facility];  Service: Cardiovascular;  Laterality: N/A;   ABDOMINAL AORTOGRAM W/LOWER EXTREMITY N/A 12/02/2019   Procedure:  ABDOMINAL AORTOGRAM W/LOWER EXTREMITY;  Surgeon: Marty Heck, MD;  Location: [facility];  Service: Cardiovascular;  Laterality: N/A;   arthroscopy of right shoulder Right    Jan 2019, June 2019-- done at [place], Dr. Ninfa Linden   BACK SURGERY     CATARACT EXTRACTION W/ INTRAOCULAR LENS  IMPLANT, BILATERAL     CYSTOSCOPY     ENDARTERECTOMY Right 08/24/2019   Procedure: ENDARTERECTOMY CAROTID;  Surgeon: Marty Heck, MD;  Location: [facility];  Service: Vascular;  Laterality: Right;   FACIAL COSMETIC SURGERY  2014   FINGER SURGERY  2002   LUMBAR LAMINECTOMY/DECOMPRESSION MICRODISCECTOMY Left 08/01/2018   Procedure: Microdiscectomy - Lumbar four-Lumbar five - left;  Surgeon: Eustace Moore, MD;  Location: [facility];  Service: Neurosurgery;  Laterality: Left;   LUMBAR LAMINECTOMY/DECOMPRESSION MICRODISCECTOMY Left 10/15/2018   Procedure: Re-do Microdiscectomy - left - Lumbar four-Lumbar five;  Surgeon: Eustace Moore, MD;  Location: [facility];  Service: Neurosurgery;  Laterality: Left;   NECK SURGERY  1986   PERIPHERAL VASCULAR ATHERECTOMY  12/02/2019   Procedure: PERIPHERAL VASCULAR ATHERECTOMY;  Surgeon: Marty Heck, MD;  Location: [facility];  Service: Cardiovascular;;  left SFA   PERIPHERAL VASCULAR INTERVENTION  06/18/2018   Procedure: PERIPHERAL VASCULAR INTERVENTION;  Surgeon: Marty Heck, MD;  Location: [facility];  Service:  Cardiovascular;;  Left external iliac   ROTATOR CUFF REPAIR Left    UMBILICAL HERNIA REPAIR      Family History  Problem Relation Age of Onset   Diabetes Sister    Diabetes Mellitus I Sister    Stroke Father    Heart disease Father    CVA Father    Coronary artery disease Father    Diabetes Mother    Heart disease Mother    Diabetes Mellitus I Mother    Coronary artery disease Mother    Coronary artery disease Brother    Heart disease Brother    Coronary artery disease Brother     SOCIAL  HISTORY: Social History   Tobacco Use   Smoking status: Former    Years: 40.00    Types: Cigarettes    Quit date: 12/2015    Years since quitting: 5.2   Smokeless tobacco: Never   Tobacco comments:    Quit 12-2012  Substance Use Topics   Alcohol use: No    Allergies  Allergen Reactions   Codeine Nausea Only    Current Outpatient Medications  Medication Sig Dispense Refill   amLODipine-benazepril (LOTREL) 10-40 MG per capsule Take 1 capsule by mouth daily.     aspirin EC 81 MG EC tablet Take 1 tablet (81 mg total) by mouth daily at 6 (six) AM.     carvedilol (COREG) 6.25 MG tablet Take 1 tablet (6.25 mg total) by mouth 2 (two) times daily. 180 tablet 3   clopidogrel (PLAVIX) 75 MG tablet Take 1 tablet (75 mg total) by mouth daily. 30 tablet 6   Continuous Blood Gluc Sensor (FREESTYLE LIBRE 2 SENSOR) MISC AS DIRECTED FOR BLOOD SUGAR CHECK DX E11.42     ferrous sulfate 325 (65 FE) MG tablet Take 325 mg by mouth daily with breakfast.     insulin degludec (TRESIBA FLEXTOUCH) 100 UNIT/ML FlexTouch Pen Inject 50 Units into the skin daily.     metFORMIN (GLUCOPHAGE-XR) 500 MG 24 hr tablet Take 1,000 mg by mouth 2 (two) times daily.     methocarbamol (ROBAXIN) 500 MG tablet Take 1 tablet (500 mg total) by mouth every 6 (six) hours as needed for muscle spasms. 60 tablet 1   ONETOUCH VERIO test strip 1 each 3 (three) times daily.     pantoprazole (PROTONIX) 40 MG tablet Take 40 mg by mouth daily with breakfast.      pioglitazone (ACTOS) 45 MG tablet TAKE 1 TABLET DAILY (NEED APPOINTMENT FOR FURTHER REFILLS) (Patient taking differently: Take 45 mg by mouth daily.) 90 tablet 0   repaglinide (PRANDIN) 2 MG tablet Take 2 mg by mouth 3 (three) times daily before meals.      rosuvastatin (CRESTOR) 40 MG tablet Take 40 mg by mouth daily.      oxyCODONE 10 MG TABS Take 1 tablet (10 mg total) by mouth every 6 (six) hours as needed for severe pain ((score 7 to 10)). (Patient not taking: Reported on  03/14/2021) 30 tablet 0   No current facility-administered medications for this visit.    REVIEW OF SYSTEMS:  [X]  denotes positive finding, [ ]  denotes negative finding Cardiac  Comments:  Chest pain or chest pressure:    Shortness of breath upon exertion:    Short of breath when lying flat:    Irregular heart rhythm:        Vascular    Pain in calf, thigh, or hip brought on by ambulation: x left  Pain in feet  at night that wakes you up from your sleep:     Blood clot in your veins:    Leg swelling:         Pulmonary    Oxygen at home:    Productive cough:     Wheezing:         Neurologic    Sudden weakness in arms or legs:     Sudden numbness in arms or legs:     Sudden onset of difficulty speaking or slurred speech:    Temporary loss of vision in one eye:     Problems with dizziness:         Gastrointestinal    Blood in stool:     Vomited blood:         Genitourinary    Burning when urinating:     Blood in urine:        Psychiatric    Major depression:         Hematologic    Bleeding problems:    Problems with blood clotting too easily:        Skin    Rashes or ulcers:        Constitutional    Fever or chills:      PHYSICAL EXAM: Vitals:   03/14/21 0904  BP: (!) 159/74  Pulse: 61  Resp: 18  Temp: 97.6 F (36.4 C)  TempSrc: Temporal  SpO2: 93%  Weight: 172 lb (78 kg)  Height: 5\' 10"  (1.778 m)    GENERAL: The patient is a well-nourished male, in no acute distress. The vital signs are documented above. CARDIAC: There is a regular rate and rhythm.  VASCULAR:  Palpable femoral pulses bilaterally No palpable pedal pulses No tissue loss PULMONARY: There is good air exchange bilaterally without wheezing or rales. ABDOMEN: Soft and non-tender with normal pitched bowel sounds.  MUSCULOSKELETAL: There are no major deformities or cyanosis. NEUROLOGIC: No focal weakness or paresthesias are detected.   DATA:   CTA reviewed from 03/10/2021 and agree  he appears to have an occlusion of the distal SFA proximal above-knee popliteal artery.  Left external iliac stent is patent.  Assessment/Plan:  70 year old male well-known to vascular surgery that previously had a left external iliac stent as well as left SFA above-knee popliteal atherectomy with drug-coated balloon.  He has new onset claudication since the summertime that has become increasingly lifestyle limiting in the left leg.  I reviewed his CTA that was ordered by the PA and he has a new occlusion of his distal SFA above-knee popliteal artery in the left leg.  I discussed proceeding back to the Cath Lab for aortogram, lower extremity arteriogram, and possible intervention.  He is interested and wants to proceed.  Risk benefits discussed.  We will get him scheduled for next week.  Just discussed that he continue his aspirin Plavix.   Marty Heck, MD [facility] Office: [place]

## 2021-03-17 ENCOUNTER — Other Ambulatory Visit: Payer: Self-pay | Admitting: Vascular Surgery

## 2021-03-23 ENCOUNTER — Encounter (HOSPITAL_COMMUNITY)
Admission: RE | Disposition: A | Discharge status: Home / Self Care | Payer: Self-pay | Source: Home / Self Care | Attending: Vascular Surgery

## 2021-03-23 ENCOUNTER — Encounter (HOSPITAL_COMMUNITY): Payer: Self-pay | Admitting: Vascular Surgery

## 2021-03-23 ENCOUNTER — Ambulatory Visit (HOSPITAL_COMMUNITY)
Admission: RE | Admit: 2021-03-23 | Discharge: 2021-03-23 | Disposition: A | Discharge status: Home / Self Care | Payer: HMO | Attending: Vascular Surgery | Admitting: Vascular Surgery

## 2021-03-23 DIAGNOSIS — Z885 Allergy status to narcotic agent status: Secondary | ICD-10-CM | POA: Diagnosis not present

## 2021-03-23 DIAGNOSIS — Z87891 Personal history of nicotine dependence: Secondary | ICD-10-CM | POA: Diagnosis not present

## 2021-03-23 DIAGNOSIS — Z823 Family history of stroke: Secondary | ICD-10-CM

## 2021-03-23 DIAGNOSIS — Z833 Family history of diabetes mellitus: Secondary | ICD-10-CM | POA: Insufficient documentation

## 2021-03-23 DIAGNOSIS — E1151 Type 2 diabetes mellitus with diabetic peripheral angiopathy without gangrene: Secondary | ICD-10-CM | POA: Insufficient documentation

## 2021-03-23 DIAGNOSIS — Z794 Long term (current) use of insulin: Secondary | ICD-10-CM | POA: Insufficient documentation

## 2021-03-23 DIAGNOSIS — Z7902 Long term (current) use of antithrombotics/antiplatelets: Secondary | ICD-10-CM | POA: Diagnosis not present

## 2021-03-23 DIAGNOSIS — Z7982 Long term (current) use of aspirin: Secondary | ICD-10-CM | POA: Diagnosis not present

## 2021-03-23 DIAGNOSIS — Z95820 Peripheral vascular angioplasty status with implants and grafts: Secondary | ICD-10-CM | POA: Diagnosis not present

## 2021-03-23 DIAGNOSIS — Z8249 Family history of ischemic heart disease and other diseases of the circulatory system: Secondary | ICD-10-CM | POA: Insufficient documentation

## 2021-03-23 DIAGNOSIS — Z7984 Long term (current) use of oral hypoglycemic drugs: Secondary | ICD-10-CM | POA: Insufficient documentation

## 2021-03-23 DIAGNOSIS — Z9889 Other specified postprocedural states: Secondary | ICD-10-CM

## 2021-03-23 DIAGNOSIS — I70212 Atherosclerosis of native arteries of extremities with intermittent claudication, left leg: Secondary | ICD-10-CM | POA: Insufficient documentation

## 2021-03-23 HISTORY — PX: ABDOMINAL AORTOGRAM W/LOWER EXTREMITY: CATH118223

## 2021-03-23 LAB — GLUCOSE, CAPILLARY: Glucose-Capillary: 156 mg/dL — ABNORMAL HIGH (ref 70–99)

## 2021-03-23 LAB — POCT I-STAT, CHEM 8
BUN: 16 mg/dL (ref 8–23)
Calcium, Ion: 1.22 mmol/L (ref 1.15–1.40)
Chloride: 107 mmol/L (ref 98–111)
Creatinine, Ser: 0.8 mg/dL (ref 0.61–1.24)
Glucose, Bld: 181 mg/dL — ABNORMAL HIGH (ref 70–99)
HCT: 37 % — ABNORMAL LOW (ref 39.0–52.0)
Hemoglobin: 12.6 g/dL — ABNORMAL LOW (ref 13.0–17.0)
Potassium: 3.6 mmol/L (ref 3.5–5.1)
Sodium: 142 mmol/L (ref 135–145)
TCO2: 24 mmol/L (ref 22–32)

## 2021-03-23 SURGERY — ABDOMINAL AORTOGRAM W/LOWER EXTREMITY
Anesthesia: LOCAL

## 2021-03-23 MED ORDER — SODIUM CHLORIDE 0.9 % IV SOLN: 250.0000 mL | INTRAVENOUS | Status: DC | PRN

## 2021-03-23 MED ORDER — LABETALOL HCL 5 MG/ML IV SOLN: 10.0000 mg | INTRAVENOUS | Status: DC | PRN

## 2021-03-23 MED ORDER — LIDOCAINE HCL (PF) 1 % IJ SOLN
INTRAMUSCULAR | Status: AC
  Filled 2021-03-23: qty 30

## 2021-03-23 MED ORDER — SODIUM CHLORIDE 0.9 % IV SOLN: INTRAVENOUS | Status: DC

## 2021-03-23 MED ORDER — HEPARIN (PORCINE) IN NACL 1000-0.9 UT/500ML-% IV SOLN
INTRAVENOUS | Status: DC | PRN
  Administered 2021-03-23 (×2): 500 mL

## 2021-03-23 MED ORDER — ACETAMINOPHEN 325 MG PO TABS: 650.0000 mg | ORAL_TABLET | ORAL | Status: DC | PRN

## 2021-03-23 MED ORDER — SODIUM CHLORIDE 0.9% FLUSH: 3.0000 mL | Freq: Two times a day (BID) | INTRAVENOUS | Status: DC

## 2021-03-23 MED ORDER — HYDRALAZINE HCL 20 MG/ML IJ SOLN
5.0000 mg | INTRAMUSCULAR | Status: DC | PRN
Start: 2021-03-23 — End: 2021-03-23

## 2021-03-23 MED ORDER — MIDAZOLAM HCL 2 MG/2ML IJ SOLN
INTRAMUSCULAR | Status: DC | PRN
  Administered 2021-03-23: 1 mg via INTRAVENOUS

## 2021-03-23 MED ORDER — FENTANYL CITRATE (PF) 100 MCG/2ML IJ SOLN
INTRAMUSCULAR | Status: AC
  Filled 2021-03-23: qty 2

## 2021-03-23 MED ORDER — IODIXANOL 320 MG/ML IV SOLN
INTRAVENOUS | Status: DC | PRN
  Administered 2021-03-23: 75 mL via INTRA_ARTERIAL

## 2021-03-23 MED ORDER — HEPARIN (PORCINE) IN NACL 1000-0.9 UT/500ML-% IV SOLN
INTRAVENOUS | Status: AC
  Filled 2021-03-23: qty 1000

## 2021-03-23 MED ORDER — LIDOCAINE HCL (PF) 1 % IJ SOLN
INTRAMUSCULAR | Status: DC | PRN
  Administered 2021-03-23: 15 mL via INTRADERMAL

## 2021-03-23 MED ORDER — MIDAZOLAM HCL 2 MG/2ML IJ SOLN
INTRAMUSCULAR | Status: AC
  Filled 2021-03-23: qty 2

## 2021-03-23 MED ORDER — FENTANYL CITRATE (PF) 100 MCG/2ML IJ SOLN
INTRAMUSCULAR | Status: DC | PRN
  Administered 2021-03-23: 50 ug via INTRAVENOUS

## 2021-03-23 MED ORDER — SODIUM CHLORIDE 0.9% FLUSH: 3.0000 mL | INTRAVENOUS | Status: DC | PRN

## 2021-03-23 MED ORDER — ONDANSETRON HCL 4 MG/2ML IJ SOLN: 4.0000 mg | Freq: Four times a day (QID) | INTRAMUSCULAR | Status: DC | PRN

## 2021-03-23 SURGICAL SUPPLY — 10 items
CATH OMNI FLUSH 5F 65CM (CATHETERS) ×2 IMPLANT
DEVICE CLOSURE MYNXGRIP 5F (Vascular Products) ×2 IMPLANT
KIT MICROPUNCTURE NIT STIFF (SHEATH) ×2 IMPLANT
KIT PV (KITS) ×2 IMPLANT
SHEATH PINNACLE 5F 10CM (SHEATH) ×2 IMPLANT
SHEATH PROBE COVER 6X72 (BAG) ×2 IMPLANT
SYR MEDRAD MARK V 150ML (SYRINGE) ×2 IMPLANT
TRANSDUCER W/STOPCOCK (MISCELLANEOUS) ×2 IMPLANT
TRAY PV CATH (CUSTOM PROCEDURE TRAY) ×2 IMPLANT
WIRE BENTSON .035X145CM (WIRE) ×2 IMPLANT

## 2021-03-23 NOTE — Op Note (Signed)
    Patient name: Luis Lindsey MRN: [id number] DOB: [birth year] Sex: male  03/23/2021 Pre-operative Diagnosis: Left lower extremity claudication Post-operative diagnosis:  Same Surgeon:  Marty Heck, MD Procedure Performed: 1.  Ultrasound-guided access right common femoral artery 2.  Aortogram with catheter selection of aorta 3.  Left lower extremity arteriogram with selection of second-order branches 4.  29 minutes of monitored moderate conscious sedation time 5.  Mynx closure right common femoral artery  Indications: Patient is a 70 year old male who has previously undergone left external iliac stent as well as left SFA popliteal atherectomy with drug-coated balloon angioplasty.  He recently presented with recurrent claudication symptoms in the left leg.  CT scan showed distal SFA above-knee popliteal occlusion.  He presents today for lower extremity arteriogram and possible invention after risk benefits discussed.  Findings:   Aortogram showed patent renal arteries bilaterally with no flow-limiting stenosis in the aortoiliac segment.  The left external iliac stent was widely patent.  Left lower extremity runoff showed a patent common femoral he does have about a 50% proximal profunda stenosis.  The SFA has ostial high-grade stenosis greater than 80% just at the takeoff of the vessel adjacent to the profunda.  The proximal to mid SFA is patent but severely diseased and small.  He has a distal SFA above-knee popliteal short occlusion.  He reconstitutes an above-knee popliteal artery with a patent below-knee popliteal artery and three-vessel runoff.   Procedure:  The patient was identified in the holding area and taken to room 8.  The patient was then placed supine on the table and prepped and draped in the usual sterile fashion.  A time out was called.  Ultrasound was used to evaluate the right common femoral artery.  It was patent .  A digital ultrasound image was acquired.  A  micropuncture needle was used to access the right common femoral artery under ultrasound guidance.  An 018 wire was advanced without resistance and a micropuncture sheath was placed.  The 018 wire was removed and a benson wire was placed.  The micropuncture sheath was exchanged for a 5 french sheath.  An omniflush catheter was advanced over the wire to the level of L-1.  An abdominal angiogram was obtained.  Next, using the omniflush catheter and a benson wire, the aortic bifurcation was crossed and the catheter was placed into theleft external iliac artery and left runoff was obtained.  After evaluating images I thought his best option would be open surgical bypass.  Wires and catheters were removed.  A access shot was obtained in the right groin and a mynx closure was deployed.   Plan: I will have the patient vein mapped and follow-up with me in the office to discuss left lower extremity bypass.  I think his most durable option is a left common femoral to below-knee popliteal bypass.  I thought any intervention on the SFA was high risk given the proximal ostial stenosis adjacent to the diseased profunda and he only has claudication symptoms at this time.   Marty Heck, MD [facility] Office: [phone]

## 2021-03-23 NOTE — H&P (Signed)
History and Physical Interval Note:  03/23/2021 7:12 AM  Luis Lindsey  has presented today for surgery, with the diagnosis of PVD.  The various methods of treatment have been discussed with the patient and family. After consideration of risks, benefits and other options for treatment, the patient has consented to  Procedure(s): ABDOMINAL AORTOGRAM W/LOWER EXTREMITY (N/A) as a surgical intervention.  The patient's history has been reviewed, patient examined, no change in status, stable for surgery.  I have reviewed the patient's chart and labs.  Questions were answered to the patient's satisfaction.    Aortogram, left leg arteriogram, claudication  Marty Heck  Patient name: Luis Lindsey  MRN: [id number]        DOB: [birth year]            Sex: male   REASON FOR VISIT: Evaluate new left leg claudication   HPI: Luis Lindsey is a 70 y.o. male that presents for evaluation of new left leg claudication.  He states this started in the summertime when he was walking his dog.  Now he is only able to walk about a block and feels this is getting worse on a weekly basis.  He is still taking Plavix and aspirin.  He denies any tobacco abuse.  He was recently seen by the PA was sent for a CTA and then to see me.   He previously underwent left external iliac stent on 06/18/2018 for left lower extremity lifestyle limiting claudication.  He later underwent atherectomy and angioplasty of his left SFA and above-knee popliteal artery on 12/02/2019 including DCB.   He has also previously undergone a right carotid endarterectomy by myself on 08/24/2019.       Past Medical History:  Diagnosis Date   Anemia     Aortic stenosis     Arthritis     BCC (basal cell carcinoma)     BPH (benign prostatic hyperplasia)     CAROTID BRUIT, RIGHT 11/30/2009   Dizziness     DM 11/30/2009   Dysphagia     Esophageal dysmotility     GERD (gastroesophageal reflux disease)     Heart murmur     History of hiatal  hernia     History of Holter monitoring 2009   History of kidney stones     HNP (herniated nucleus pulposus), lumbar      L4-5   HYPERCHOLESTEROLEMIA 11/30/2009   Hyperlipidemia     HYPERTENSION 11/30/2009   Kidney stones     Numbness and tingling     Peripheral vascular disease ([place])     Right carotid bruit     SMOKER 11/30/2009   Stroke ([place]) 2012   URINARY CALCULUS 11/30/2009           Past Surgical History:  Procedure Laterality Date   ABDOMINAL AORTOGRAM W/LOWER EXTREMITY N/A 06/18/2018    Procedure: ABDOMINAL AORTOGRAM W/LOWER EXTREMITY;  Surgeon: Marty Heck, MD;  Location: [facility];  Service: Cardiovascular;  Laterality: N/A;   ABDOMINAL AORTOGRAM W/LOWER EXTREMITY N/A 12/02/2019    Procedure: ABDOMINAL AORTOGRAM W/LOWER EXTREMITY;  Surgeon: Marty Heck, MD;  Location: [facility];  Service: Cardiovascular;  Laterality: N/A;   arthroscopy of right shoulder Right      Jan 2019, June 2019-- done at [place], Dr. Ninfa Linden   BACK SURGERY       CATARACT EXTRACTION W/ INTRAOCULAR LENS  IMPLANT, BILATERAL       CYSTOSCOPY  ENDARTERECTOMY Right 08/24/2019    Procedure: ENDARTERECTOMY CAROTID;  Surgeon: Marty Heck, MD;  Location: [facility];  Service: Vascular;  Laterality: Right;   FACIAL COSMETIC SURGERY   2014   FINGER SURGERY   2002   LUMBAR LAMINECTOMY/DECOMPRESSION MICRODISCECTOMY Left 08/01/2018    Procedure: Microdiscectomy - Lumbar four-Lumbar five - left;  Surgeon: Eustace Moore, MD;  Location: [facility];  Service: Neurosurgery;  Laterality: Left;   LUMBAR LAMINECTOMY/DECOMPRESSION MICRODISCECTOMY Left 10/15/2018    Procedure: Re-do Microdiscectomy - left - Lumbar four-Lumbar five;  Surgeon: Eustace Moore, MD;  Location: [facility];  Service: Neurosurgery;  Laterality: Left;   NECK SURGERY   1986   PERIPHERAL VASCULAR ATHERECTOMY   12/02/2019    Procedure: PERIPHERAL VASCULAR ATHERECTOMY;  Surgeon: Marty Heck,  MD;  Location: [facility];  Service: Cardiovascular;;  left SFA   PERIPHERAL VASCULAR INTERVENTION   06/18/2018    Procedure: PERIPHERAL VASCULAR INTERVENTION;  Surgeon: Marty Heck, MD;  Location: [facility];  Service: Cardiovascular;;  Left external iliac   ROTATOR CUFF REPAIR Left     UMBILICAL HERNIA REPAIR               Family History  Problem Relation Age of Onset   Diabetes Sister     Diabetes Mellitus I Sister     Stroke Father     Heart disease Father     CVA Father     Coronary artery disease Father     Diabetes Mother     Heart disease Mother     Diabetes Mellitus I Mother     Coronary artery disease Mother     Coronary artery disease Brother     Heart disease Brother     Coronary artery disease Brother        SOCIAL HISTORY: Social History         Tobacco Use   Smoking status: Former      Years: 40.00      Types: Cigarettes      Quit date: 12/2015      Years since quitting: 5.2   Smokeless tobacco: Never   Tobacco comments:      Quit 12-2012  Substance Use Topics   Alcohol use: No          Allergies  Allergen Reactions   Codeine Nausea Only            Current Outpatient Medications  Medication Sig Dispense Refill   amLODipine-benazepril (LOTREL) 10-40 MG per capsule Take 1 capsule by mouth daily.       aspirin EC 81 MG EC tablet Take 1 tablet (81 mg total) by mouth daily at 6 (six) AM.       carvedilol (COREG) 6.25 MG tablet Take 1 tablet (6.25 mg total) by mouth 2 (two) times daily. 180 tablet 3   clopidogrel (PLAVIX) 75 MG tablet Take 1 tablet (75 mg total) by mouth daily. 30 tablet 6   Continuous Blood Gluc Sensor (FREESTYLE LIBRE 2 SENSOR) MISC AS DIRECTED FOR BLOOD SUGAR CHECK DX E11.42       ferrous sulfate 325 (65 FE) MG tablet Take 325 mg by mouth daily with breakfast.       insulin degludec (TRESIBA FLEXTOUCH) 100 UNIT/ML FlexTouch Pen Inject 50 Units into the skin daily.       metFORMIN (GLUCOPHAGE-XR) 500 MG 24  hr tablet Take 1,000 mg by mouth 2 (two) times daily.  methocarbamol (ROBAXIN) 500 MG tablet Take 1 tablet (500 mg total) by mouth every 6 (six) hours as needed for muscle spasms. 60 tablet 1   ONETOUCH VERIO test strip 1 each 3 (three) times daily.       pantoprazole (PROTONIX) 40 MG tablet Take 40 mg by mouth daily with breakfast.        pioglitazone (ACTOS) 45 MG tablet TAKE 1 TABLET DAILY (NEED APPOINTMENT FOR FURTHER REFILLS) (Patient taking differently: Take 45 mg by mouth daily.) 90 tablet 0   repaglinide (PRANDIN) 2 MG tablet Take 2 mg by mouth 3 (three) times daily before meals.        rosuvastatin (CRESTOR) 40 MG tablet Take 40 mg by mouth daily.        oxyCODONE 10 MG TABS Take 1 tablet (10 mg total) by mouth every 6 (six) hours as needed for severe pain ((score 7 to 10)). (Patient not taking: Reported on 03/14/2021) 30 tablet 0    No current facility-administered medications for this visit.      REVIEW OF SYSTEMS:  [X]  denotes positive finding, [ ]  denotes negative finding Cardiac   Comments:  Chest pain or chest pressure:      Shortness of breath upon exertion:      Short of breath when lying flat:      Irregular heart rhythm:             Vascular      Pain in calf, thigh, or hip brought on by ambulation: x left  Pain in feet at night that wakes you up from your sleep:       Blood clot in your veins:      Leg swelling:              Pulmonary      Oxygen at home:      Productive cough:       Wheezing:              Neurologic      Sudden weakness in arms or legs:       Sudden numbness in arms or legs:       Sudden onset of difficulty speaking or slurred speech:      Temporary loss of vision in one eye:       Problems with dizziness:              Gastrointestinal      Blood in stool:       Vomited blood:              Genitourinary      Burning when urinating:       Blood in urine:             Psychiatric      Major depression:              Hematologic       Bleeding problems:      Problems with blood clotting too easily:             Skin      Rashes or ulcers:             Constitutional      Fever or chills:          PHYSICAL EXAM:    Vitals:    03/14/21 0904  BP: (!) 159/74  Pulse: 61  Resp: 18  Temp: 97.6 F (36.4 C)  TempSrc: Temporal  SpO2: 93%  Weight: 172 lb (78 kg)  Height: 5\' 10"  (1.778 m)      GENERAL: The patient is a well-nourished male, in no acute distress. The vital signs are documented above. CARDIAC: There is a regular rate and rhythm.  VASCULAR:  Palpable femoral pulses bilaterally No palpable pedal pulses No tissue loss PULMONARY: There is good air exchange bilaterally without wheezing or rales. ABDOMEN: Soft and non-tender with normal pitched bowel sounds.  MUSCULOSKELETAL: There are no major deformities or cyanosis. NEUROLOGIC: No focal weakness or paresthesias are detected.     DATA:    CTA reviewed from 03/10/2021 and agree he appears to have an occlusion of the distal SFA proximal above-knee popliteal artery.  Left external iliac stent is patent.   Assessment/Plan:   70 year old male well-known to vascular surgery that previously had a left external iliac stent as well as left SFA above-knee popliteal atherectomy with drug-coated balloon.  He has new onset claudication since the summertime that has become increasingly lifestyle limiting in the left leg.  I reviewed his CTA that was ordered by the PA and he has a new occlusion of his distal SFA above-knee popliteal artery in the left leg.  I discussed proceeding back to the Cath Lab for aortogram, lower extremity arteriogram, and possible intervention.  He is interested and wants to proceed.  Risk benefits discussed.  We will get him scheduled for next week.  Just discussed that he continue his aspirin Plavix.     Marty Heck, MD [facility] Office: [phone]

## 2021-03-23 NOTE — Progress Notes (Signed)
Pt ambulated without difficulty or bleeding.   Discharged home with his wife who will drive and stay with pt x 24 hrs. 

## 2021-04-03 ENCOUNTER — Other Ambulatory Visit: Payer: Self-pay

## 2021-04-03 DIAGNOSIS — I739 Peripheral vascular disease, unspecified: Secondary | ICD-10-CM

## 2021-04-25 ENCOUNTER — Ambulatory Visit (INDEPENDENT_AMBULATORY_CARE_PROVIDER_SITE_OTHER): Payer: HMO | Admitting: Vascular Surgery

## 2021-04-25 ENCOUNTER — Other Ambulatory Visit: Payer: Self-pay

## 2021-04-25 ENCOUNTER — Encounter: Payer: Self-pay | Admitting: Vascular Surgery

## 2021-04-25 ENCOUNTER — Ambulatory Visit (HOSPITAL_COMMUNITY)
Admission: RE | Admit: 2021-04-25 | Discharge: 2021-04-25 | Disposition: A | Discharge status: Home / Self Care | Payer: HMO | Source: Ambulatory Visit | Attending: Vascular Surgery | Admitting: Vascular Surgery

## 2021-04-25 VITALS — BP 157/60 | HR 73 | Temp 97.9°F | Resp 18 | Ht 69.0 in | Wt 171.5 lb

## 2021-04-25 DIAGNOSIS — M961 Postlaminectomy syndrome, not elsewhere classified: Secondary | ICD-10-CM | POA: Diagnosis not present

## 2021-04-25 DIAGNOSIS — I739 Peripheral vascular disease, unspecified: Secondary | ICD-10-CM | POA: Diagnosis not present

## 2021-04-25 DIAGNOSIS — Z419 Encounter for procedure for purposes other than remedying health state, unspecified: Secondary | ICD-10-CM

## 2021-04-25 NOTE — Progress Notes (Signed)
Patient name: Luis Lindsey MRN: [id number] DOB: 1951/01/10 Sex: male  REASON FOR VISIT: F/U to discuss left leg bypass  HPI: Luis Lindsey is a 71 y.o. male with hx HTN, HLD, DM that presents to discuss left leg bypass after he was recently seen for worsening left leg claudication that started in the summertime when he was walking his dog.  Now he is only able to walk about a block and feels this is getting worse on a weekly basis.  We recently performed angiogram on 03/23/2021 and felt his best option would likely be a bypass and he presents today for vein mapping.  His ABIs 0.41 monophasic on the left.  States that his left calf burning and claudication have become much more disabling.  Now really unable to go to the grocery store and cannot walk his dog.  Can only walk several 100 feet and has to stop.  He previously underwent left external iliac stent on 06/18/2018 for left lower extremity lifestyle limiting claudication.  He later underwent atherectomy and angioplasty of his left SFA and above-knee popliteal artery on 12/02/2019 including DCB.  He has also previously undergone a right carotid endarterectomy by myself on 08/24/2019.  He is still taking Plavix and aspirin.  He denies any tobacco abuse.    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/2009   Dysphagia    Esophageal dysmotility    GERD (gastroesophageal reflux disease)    Heart murmur    History of hiatal hernia    History of Holter monitoring 2009   History of kidney stones    HNP (herniated nucleus pulposus), lumbar    L4-5   HYPERCHOLESTEROLEMIA 11/30/2009   Hyperlipidemia    HYPERTENSION 11/30/2009   Kidney stones    Numbness and tingling    Peripheral vascular disease ([place])    Right carotid bruit    SMOKER 11/30/2009   Stroke ([place]) 2012   URINARY  CALCULUS 11/30/2009    Past Surgical History:  Procedure Laterality Date   ABDOMINAL AORTOGRAM W/LOWER EXTREMITY N/A 06/18/2018   Procedure: ABDOMINAL AORTOGRAM W/LOWER EXTREMITY;  Surgeon: Marty Heck, MD;  Location: [facility];  Service: Cardiovascular;  Laterality: N/A;   ABDOMINAL AORTOGRAM W/LOWER EXTREMITY N/A 12/02/2019   Procedure: ABDOMINAL AORTOGRAM W/LOWER EXTREMITY;  Surgeon: Marty Heck, MD;  Location: [facility];  Service: Cardiovascular;  Laterality: N/A;   ABDOMINAL AORTOGRAM W/LOWER EXTREMITY N/A 03/23/2021   Procedure: ABDOMINAL AORTOGRAM W/LOWER EXTREMITY;  Surgeon: Marty Heck, MD;  Location: [facility];  Service: Cardiovascular;  Laterality: N/A;   arthroscopy of right shoulder Right    Jan 2019, June 2019-- done at [facility], Dr. Ninfa Linden   BACK SURGERY     CATARACT EXTRACTION W/ INTRAOCULAR LENS  IMPLANT, BILATERAL     CYSTOSCOPY     ENDARTERECTOMY Right 08/24/2019   Procedure: ENDARTERECTOMY CAROTID;  Surgeon: Marty Heck, MD;  Location: [facility];  Service: Vascular;  Laterality: Right;   FACIAL COSMETIC SURGERY  2014   FINGER SURGERY  2002   LUMBAR LAMINECTOMY/DECOMPRESSION MICRODISCECTOMY Left 08/01/2018   Procedure: Microdiscectomy - Lumbar four-Lumbar five - left;  Surgeon: Eustace Moore, MD;  Location: [facility];  Service: Neurosurgery;  Laterality: Left;   LUMBAR LAMINECTOMY/DECOMPRESSION MICRODISCECTOMY Left 10/15/2018   Procedure: Re-do Microdiscectomy - left - Lumbar four-Lumbar five;  Surgeon: Eustace Moore, MD;  Location: [facility];  Service: Neurosurgery;  Laterality: Left;   NECK SURGERY  1986   PERIPHERAL VASCULAR ATHERECTOMY  12/02/2019   Procedure: PERIPHERAL VASCULAR ATHERECTOMY;  Surgeon: Marty Heck, MD;  Location: [facility];  Service: Cardiovascular;;  left SFA   PERIPHERAL VASCULAR INTERVENTION  06/18/2018   Procedure: PERIPHERAL VASCULAR INTERVENTION;  Surgeon:  Marty Heck, MD;  Location: [facility];  Service: Cardiovascular;;  Left external iliac   ROTATOR CUFF REPAIR Left    UMBILICAL HERNIA REPAIR      Family History  Problem Relation Age of Onset   Diabetes Sister    Diabetes Mellitus I Sister    Stroke Father    Heart disease Father    CVA Father    Coronary artery disease Father    Diabetes Mother    Heart disease Mother    Diabetes Mellitus I Mother    Coronary artery disease Mother    Coronary artery disease Brother    Heart disease Brother    Coronary artery disease Brother     SOCIAL HISTORY: Social History   Tobacco Use   Smoking status: Former    Years: 40.00    Types: Cigarettes    Quit date: 12/2015    Years since quitting: 5.3   Smokeless tobacco: Never   Tobacco comments:    Quit 12-2012  Substance Use Topics   Alcohol use: No    Allergies  Allergen Reactions   Codeine Nausea Only    Current Outpatient Medications  Medication Sig Dispense Refill   amLODipine-benazepril (LOTREL) 10-40 MG per capsule Take 1 capsule by mouth daily.     aspirin EC 81 MG EC tablet Take 1 tablet (81 mg total) by mouth daily at 6 (six) AM.     carvedilol (COREG) 6.25 MG tablet Take 1 tablet (6.25 mg total) by mouth 2 (two) times daily. 180 tablet 3   clopidogrel (PLAVIX) 75 MG tablet TAKE 1 TABLET BY MOUTH EVERY DAY 90 tablet 2   Continuous Blood Gluc Sensor (FREESTYLE LIBRE 2 SENSOR) MISC AS DIRECTED FOR BLOOD SUGAR CHECK DX E11.42     ferrous sulfate 325 (65 FE) MG tablet Take 325 mg by mouth daily with breakfast.     insulin degludec (TRESIBA FLEXTOUCH) 100 UNIT/ML FlexTouch Pen Inject 60 Units into the skin in the morning.     metFORMIN (GLUCOPHAGE-XR) 500 MG 24 hr tablet Take 1,000 mg by mouth 2 (two) times daily.     ONETOUCH VERIO test strip 1 each 3 (three) times daily.     pantoprazole (PROTONIX) 40 MG tablet Take 40 mg by mouth daily with breakfast.      pioglitazone (ACTOS) 45 MG tablet TAKE 1  TABLET DAILY (NEED APPOINTMENT FOR FURTHER REFILLS) (Patient taking differently: Take 45 mg by mouth daily.) 90 tablet 0   repaglinide (PRANDIN) 2 MG tablet Take 2 mg by mouth 3 (three) times daily before meals.      rosuvastatin (CRESTOR) 40 MG tablet Take 40 mg by mouth daily.      No current facility-administered medications for this visit.    REVIEW OF SYSTEMS:  [X]  denotes positive finding, [ ]  denotes negative finding Cardiac  Comments:  Chest pain or chest pressure:    Shortness of breath upon exertion:    Short of  breath when lying flat:    Irregular heart rhythm:        Vascular    Pain in calf, thigh, or hip brought on by ambulation: x left  Pain in feet at night that wakes you up from your sleep:     Blood clot in your veins:    Leg swelling:         Pulmonary    Oxygen at home:    Productive cough:     Wheezing:         Neurologic    Sudden weakness in arms or legs:     Sudden numbness in arms or legs:     Sudden onset of difficulty speaking or slurred speech:    Temporary loss of vision in one eye:     Problems with dizziness:         Gastrointestinal    Blood in stool:     Vomited blood:         Genitourinary    Burning when urinating:     Blood in urine:        Psychiatric    Major depression:         Hematologic    Bleeding problems:    Problems with blood clotting too easily:        Skin    Rashes or ulcers:        Constitutional    Fever or chills:      PHYSICAL EXAM: Vitals:   04/25/21 1327  BP: (!) 157/60  Pulse: 73  Resp: 18  Temp: 97.9 F (36.6 C)  TempSrc: Temporal  SpO2: 100%  Weight: 171 lb 8 oz (77.8 kg)  Height: 5\' 9"  (1.753 m)    GENERAL: The patient is a well-nourished male, in no acute distress. The vital signs are documented above. CARDIAC: There is a regular rate and rhythm.  VASCULAR:  Palpable femoral pulses bilaterally No palpable pedal pulses No tissue loss PULMONARY: No respiratory distress ABDOMEN: Soft  and non-tender. MUSCULOSKELETAL: There are no major deformities or cyanosis. NEUROLOGIC: No focal weakness or paresthesias are detected.   DATA:   Saphenous vein mapping today shows no long segment surface vein.  Angiogram again reviewed from 03/23/2021 and he appears to have a high-grade ostial stenosis at the SFA greater than 80% with a diseased proximal and mid SFA and then a distal SFA above-knee popliteal occlusion.  Assessment/Plan:  70 year old male well-known to vascular surgery that previously had a left external iliac stent as well as left SFA above-knee popliteal atherectomy with drug-coated balloon.  Recently underwent angiogram on 03/23/2021 for worsening lifestyle limiting claudication in the left lower extremity.  Given a high-grade proximal SFA ostial stenosis with a diseased SFA and then a distal SFA above-knee popliteal occlusion, I thought his best option would likely be left fem pop bypass.  He presents today for vein mapping and to discuss bypass options.  I discussed he really does not have any long segment surface vein and ultimately may require prosthetic graft.  I will look again in the OR to see if I can find a segment of vein.  We discussed a prosthetic graft being of poorer durability and higher risk for infection.  Ultimately he wishes to proceed and feels that his claudication has become quite disabling.  We will get him scheduled for 05/08/2021.  Risk benefits discussed.  No cardiac history according to the patient   Marty Heck, MD [facility] Office: [place]

## 2021-04-28 ENCOUNTER — Telehealth: Payer: Self-pay

## 2021-04-28 NOTE — Telephone Encounter (Signed)
Patient contacted office requesting to reschedule left fem-pop bypass until after first of the year. Surgery scheduled for 06/05/21. Instructions reviewed. Patient verbalized understanding.

## 2021-05-23 ENCOUNTER — Other Ambulatory Visit: Payer: Self-pay

## 2021-05-23 NOTE — Pre-Procedure Instructions (Signed)
Surgical Instructions    Your procedure is scheduled on Monday, January 9th.  Report to [facility] Main Entrance "A" at 05:30 A.M., then check in with the Admitting office.  Call this number if you have problems the morning of surgery:  [phone]   If you have any questions prior to your surgery date call [phone]: Open Monday-Friday 8am-4pm    Remember:  Do not eat after midnight the night before your surgery  You may drink clear liquids until 04:30 AM the morning of your surgery.   Clear liquids allowed are: Water, Non-Citrus Juices (without pulp), Carbonated Beverages, Clear Tea, Black Coffee Only, and Gatorade    Take these medicines the morning of surgery with A SIP OF WATER  carvedilol (COREG)  aspirin EC 81 MG pantoprazole (PROTONIX)  rosuvastatin (CRESTOR)  YOU WILL NEED TO ADJUST YOUR INSULIN: TAKE ONLY HALF DOSE THE NIGHT BEFORE AND THE MORNING OF YOUR PROCEDURE.  YOU WILL NEED TO HOLD YOUR  PLAVIX FOR 5 DAYS BEFORE YOUR PROCEDURE.    WHAT DO I DO ABOUT MY DIABETES MEDICATION?   YOU WILL NEED TO HOLD YOUR METFORMIN, repaglinide (PRANDIN), and  pioglitazone (ACTOS) THE MORNING OF YOUR PROCEDURE.  THE MORNING BEFORE SURGERY, take usual dose of insulin degludec (TRESIBA FLEXTOUCH) (60 units)     THE MORNING OF SURGERY, take half of your dose of insulin degludec (TRESIBA FLEXTOUCH) (30 units).  The day of surgery, do not take other diabetes injectables, including Byetta (exenatide), Bydureon (exenatide ER), Victoza (liraglutide), or Trulicity (dulaglutide).    HOW TO MANAGE YOUR DIABETES BEFORE AND AFTER SURGERY  Why is it important to control my blood sugar before and after surgery? Improving blood sugar levels before and after surgery helps healing and can limit problems. A way of improving blood sugar control is eating a healthy diet by:  Eating less sugar and carbohydrates  Increasing activity/exercise  Talking with your doctor about reaching your  blood sugar goals High blood sugars (greater than 180 mg/dL) can raise your risk of infections and slow your recovery, so you will need to focus on controlling your diabetes during the weeks before surgery. Make sure that the doctor who takes care of your diabetes knows about your planned surgery including the date and location.  How do I manage my blood sugar before surgery? Check your blood sugar at least 4 times a day, starting 2 days before surgery, to make sure that the level is not too high or low.  Check your blood sugar the morning of your surgery when you wake up and every 2 hours until you get to the Short Stay unit.  If your blood sugar is less than 70 mg/dL, you will need to treat for low blood sugar: Do not take insulin. Treat a low blood sugar (less than 70 mg/dL) with  cup of clear juice (cranberry or apple), 4 glucose tablets, OR glucose gel. Recheck blood sugar in 15 minutes after treatment (to make sure it is greater than 70 mg/dL). If your blood sugar is not greater than 70 mg/dL on recheck, call [phone] for further instructions. Report your blood sugar to the short stay nurse when you get to Short Stay.  If you are admitted to the hospital after surgery: Your blood sugar will be checked by the staff and you will probably be given insulin after surgery (instead of oral diabetes medicines) to make sure you have good blood sugar levels. The goal for blood sugar control after surgery is  80-180 mg/dL.   As of today, STOP taking any Aspirin (unless otherwise instructed by your surgeon) Aleve, Naproxen, Ibuprofen, Motrin, Advil, Goody's, BC's, all herbal medications, fish oil, and all vitamins.                     Do NOT Smoke (Tobacco/Vaping) or drink Alcohol 24 hours prior to your procedure.  If you use a CPAP at night, you may bring all equipment for your overnight stay.   Contacts, glasses, piercing's, hearing aid's, dentures or partials may not be worn into surgery,  please bring cases for these belongings.    For patients admitted to the hospital, discharge time will be determined by your treatment team.   Patients discharged the day of surgery will not be allowed to drive home, and someone needs to stay with them for 24 hours.  NO VISITORS WILL BE ALLOWED IN PRE-OP WHERE PATIENTS GET READY FOR SURGERY.  ONLY 1 SUPPORT PERSON MAY BE PRESENT IN THE WAITING ROOM WHILE YOU ARE IN SURGERY.  IF YOU ARE TO BE ADMITTED, ONCE YOU ARE IN YOUR ROOM YOU WILL BE ALLOWED TWO (2) VISITORS.  Minor children may have two parents present. Special consideration for safety and communication needs will be reviewed on a case by case basis.   Special instructions:   [facility]- Preparing For Surgery  Before surgery, you can play an important role. Because skin is not sterile, your skin needs to be as free of germs as possible. You can reduce the number of germs on your skin by washing with CHG (chlorahexidine gluconate) Soap before surgery.  CHG is an antiseptic cleaner which kills germs and bonds with the skin to continue killing germs even after washing.    Oral Hygiene is also important to reduce your risk of infection.  Remember - BRUSH YOUR TEETH THE MORNING OF SURGERY WITH YOUR REGULAR TOOTHPASTE  Please do not use if you have an allergy to CHG or antibacterial soaps. If your skin becomes reddened/irritated stop using the CHG.  Do not shave (including legs and underarms) for at least 48 hours prior to first CHG shower. It is OK to shave your face.  Please follow these instructions carefully.   Shower the NIGHT BEFORE SURGERY and the MORNING OF SURGERY  If you chose to wash your hair, wash your hair first as usual with your normal shampoo.  After you shampoo, rinse your hair and body thoroughly to remove the shampoo.  Use CHG Soap as you would any other liquid soap. You can apply CHG directly to the skin and wash gently with a scrungie or a clean washcloth.   Apply  the CHG Soap to your body ONLY FROM THE NECK DOWN.  Do not use on open wounds or open sores. Avoid contact with your eyes, ears, mouth and genitals (private parts). Wash Face and genitals (private parts)  with your normal soap.   Wash thoroughly, paying special attention to the area where your surgery will be performed.  Thoroughly rinse your body with warm water from the neck down.  DO NOT shower/wash with your normal soap after using and rinsing off the CHG Soap.  Pat yourself dry with a CLEAN TOWEL.  Wear CLEAN PAJAMAS to bed the night before surgery  Place CLEAN SHEETS on your bed the night before your surgery  DO NOT SLEEP WITH PETS.   Day of Surgery: Shower with CHG soap. Do not wear jewelry Do not wear lotions, powders,  colognes, or deodorant.  Men may shave face and neck. Do not bring valuables to the hospital. [facility] is not responsible for any belongings or valuables. Wear Clean/Comfortable clothing the morning of surgery Remember to brush your teeth WITH YOUR REGULAR TOOTHPASTE.   Please read over the following fact sheets that you were given.   3 days prior to your procedure or After your COVID test   You are not required to quarantine however you are required to wear a well-fitting mask when you are out and around people not in your household. If your mask becomes wet or soiled, replace with a new one.   Wash your hands often with soap and water for 20 seconds or clean your hands with an alcohol-based hand sanitizer that contains at least 60% alcohol.   Do not share personal items.   Notify your provider:  o if you are in close contact with someone who has COVID  o or if you develop a fever of 100.4 or greater, sneezing, cough, sore throat, shortness of breath or body aches.

## 2021-05-24 ENCOUNTER — Other Ambulatory Visit: Payer: Self-pay

## 2021-05-24 ENCOUNTER — Encounter (HOSPITAL_COMMUNITY)
Admission: RE | Admit: 2021-05-24 | Discharge: 2021-05-24 | Disposition: A | Discharge status: Home / Self Care | Payer: HMO | Source: Ambulatory Visit | Attending: Vascular Surgery | Admitting: Vascular Surgery

## 2021-05-24 ENCOUNTER — Encounter (HOSPITAL_COMMUNITY): Payer: Self-pay

## 2021-05-24 VITALS — BP 163/59 | HR 62 | Temp 97.4°F | Resp 17 | Ht 70.0 in | Wt 171.5 lb

## 2021-05-24 DIAGNOSIS — Z794 Long term (current) use of insulin: Secondary | ICD-10-CM | POA: Insufficient documentation

## 2021-05-24 DIAGNOSIS — I119 Hypertensive heart disease without heart failure: Secondary | ICD-10-CM | POA: Insufficient documentation

## 2021-05-24 DIAGNOSIS — R011 Cardiac murmur, unspecified: Secondary | ICD-10-CM | POA: Insufficient documentation

## 2021-05-24 DIAGNOSIS — E1151 Type 2 diabetes mellitus with diabetic peripheral angiopathy without gangrene: Secondary | ICD-10-CM | POA: Diagnosis not present

## 2021-05-24 DIAGNOSIS — I6529 Occlusion and stenosis of unspecified carotid artery: Secondary | ICD-10-CM | POA: Insufficient documentation

## 2021-05-24 DIAGNOSIS — Z7982 Long term (current) use of aspirin: Secondary | ICD-10-CM | POA: Diagnosis not present

## 2021-05-24 DIAGNOSIS — Z87891 Personal history of nicotine dependence: Secondary | ICD-10-CM | POA: Diagnosis not present

## 2021-05-24 DIAGNOSIS — E1142 Type 2 diabetes mellitus with diabetic polyneuropathy: Secondary | ICD-10-CM | POA: Diagnosis not present

## 2021-05-24 DIAGNOSIS — Z95820 Peripheral vascular angioplasty status with implants and grafts: Secondary | ICD-10-CM | POA: Diagnosis not present

## 2021-05-24 DIAGNOSIS — Z7984 Long term (current) use of oral hypoglycemic drugs: Secondary | ICD-10-CM | POA: Insufficient documentation

## 2021-05-24 DIAGNOSIS — Z7902 Long term (current) use of antithrombotics/antiplatelets: Secondary | ICD-10-CM | POA: Diagnosis not present

## 2021-05-24 DIAGNOSIS — I350 Nonrheumatic aortic (valve) stenosis: Secondary | ICD-10-CM | POA: Insufficient documentation

## 2021-05-24 DIAGNOSIS — Z01818 Encounter for other preprocedural examination: Secondary | ICD-10-CM

## 2021-05-24 DIAGNOSIS — Z01812 Encounter for preprocedural laboratory examination: Secondary | ICD-10-CM | POA: Insufficient documentation

## 2021-05-24 DIAGNOSIS — Z8673 Personal history of transient ischemic attack (TIA), and cerebral infarction without residual deficits: Secondary | ICD-10-CM | POA: Insufficient documentation

## 2021-05-24 DIAGNOSIS — Z419 Encounter for procedure for purposes other than remedying health state, unspecified: Secondary | ICD-10-CM | POA: Diagnosis not present

## 2021-05-24 LAB — COMPREHENSIVE METABOLIC PANEL
ALT: 18 U/L (ref 0–44)
AST: 16 U/L (ref 15–41)
Albumin: 3.5 g/dL (ref 3.5–5.0)
Alkaline Phosphatase: 88 U/L (ref 38–126)
Anion gap: 8 (ref 5–15)
BUN: 15 mg/dL (ref 8–23)
CO2: 20 mmol/L — ABNORMAL LOW (ref 22–32)
Calcium: 9.3 mg/dL (ref 8.9–10.3)
Chloride: 109 mmol/L (ref 98–111)
Creatinine, Ser: 0.67 mg/dL (ref 0.61–1.24)
GFR, Estimated: >60 mL/min (ref >60)
Glucose, Bld: 163 mg/dL — ABNORMAL HIGH (ref 70–99)
Potassium: 3.7 mmol/L (ref 3.5–5.1)
Sodium: 137 mmol/L (ref 135–145)
Total Bilirubin: 0.5 mg/dL (ref 0.3–1.2)
Total Protein: 7.1 g/dL (ref 6.5–8.1)

## 2021-05-24 LAB — SURGICAL PCR SCREEN
MRSA, PCR: NEGATIVE
Staphylococcus aureus: NEGATIVE

## 2021-05-24 LAB — BLOOD GAS, ARTERIAL
Acid-base deficit: 1 mmol/L (ref 0.0–2.0)
Bicarbonate: 23.1 mmol/L (ref 20.0–28.0)
Drawn by: 60286
FIO2: 21
O2 Saturation: 98.6 %
Patient temperature: 37
pCO2 arterial: 37.5 mmHg (ref 32.0–48.0)
pH, Arterial: 7.406 (ref 7.350–7.450)
pO2, Arterial: 125 mmHg — ABNORMAL HIGH (ref 83.0–108.0)

## 2021-05-24 LAB — HEMOGLOBIN A1C
Hgb A1c MFr Bld: 9.8 % — ABNORMAL HIGH (ref 4.8–5.6)
Mean Plasma Glucose: 234.56 mg/dL

## 2021-05-24 LAB — TYPE AND SCREEN
ABO/RH(D): O POS
Antibody Screen: NEGATIVE

## 2021-05-24 LAB — CBC
HCT: 37 % — ABNORMAL LOW (ref 39.0–52.0)
Hemoglobin: 11.8 g/dL — ABNORMAL LOW (ref 13.0–17.0)
MCH: 28.7 pg (ref 26.0–34.0)
MCHC: 31.9 g/dL (ref 30.0–36.0)
MCV: 90 fL (ref 80.0–100.0)
Platelets: 282 10*3/uL (ref 150–400)
RBC: 4.11 MIL/uL — ABNORMAL LOW (ref 4.22–5.81)
RDW: 15 % (ref 11.5–15.5)
WBC: 6.1 10*3/uL (ref 4.0–10.5)
nRBC: 0 % (ref 0.0–0.2)

## 2021-05-24 LAB — URINALYSIS, ROUTINE W REFLEX MICROSCOPIC
Bacteria, UA: NONE SEEN
Bilirubin Urine: NEGATIVE
Glucose, UA: >=500 mg/dL — AB
Ketones, ur: NEGATIVE mg/dL
Leukocytes,Ua: NEGATIVE
Nitrite: NEGATIVE
Protein, ur: 100 mg/dL — AB
Specific Gravity, Urine: 1.021 (ref 1.005–1.030)
pH: 6 (ref 5.0–8.0)

## 2021-05-24 LAB — PROTIME-INR
INR: 1 (ref 0.8–1.2)
Prothrombin Time: 13 seconds (ref 11.4–15.2)

## 2021-05-24 LAB — APTT: aPTT: 32 seconds (ref 24–36)

## 2021-05-24 LAB — GLUCOSE, CAPILLARY: Glucose-Capillary: 186 mg/dL — ABNORMAL HIGH (ref 70–99)

## 2021-05-24 NOTE — Progress Notes (Signed)
PCP - Dr. Donnie Coffin Cardiologist - Dr. Gwyndolyn Kaufman  PPM/ICD - denies   Chest x-ray - 12/05/20 EKG - 12/05/20 Stress Test - 2014 ECHO - 07/18/20 Cardiac Cath - denies  Sleep Study - pt states he had one several years ago but was not diagnosed with OSA  DM- Type 2 Fasting Blood Sugar - 200 Checks Blood Sugar 5-6 times a day. Pt has a continuous sensor.  Blood Thinner Instructions: Hold Plavix for 5 days Aspirin Instructions: Ok to take thru day of surgery  ERAS Protcol - no, NPO   COVID TEST- Pt scheduled for testing on 1/5   Anesthesia review: yes, cardiac hx  Patient denies shortness of breath, fever, cough and chest pain at PAT appointment   All instructions explained to the patient, with a verbal understanding of the material. Patient agrees to go over the instructions while at home for a better understanding. Patient also instructed to wear a mask in public after being tested for COVID-19. The opportunity to ask questions was provided.

## 2021-05-25 NOTE — Progress Notes (Signed)
Anesthesia Chart Review:  Case: [id number] Date/Time: 06/05/21 0715   Procedure: LEFT FEMORAL-POPLITEAL ARTERY BYPASS GRAFTING (Left)   Anesthesia type: General   Pre-op diagnosis: PAD   Location: [facility] OR ROOM 11 / [place] [facility]   Surgeons: Marty Heck, MD       DISCUSSION: Patient is a 70 year old male scheduled for the above procedure. He has had progressive LLE claudication, to the point that he can only walk several 100 feet before resting. Previously he was able to walk his dog during the summer. He tolerated L4-5 PLIF 12/07/20.    History includes former smoker (quit 2017), HTN, DM2, PAD (s/p left EIA stent 06/18/18; atherectomy/angioplasty left SFA 12/02/19), CVA (2012), carotid artery stenosis (s/p right carotid endarterectomy 08/24/19), murmur (mild-moderate AS 06/2020), facial cosmetic surgery (not specified, 2014), hiatal hernia, spinal surgery (s/p left L4-5 microdiscectomy 08/01/18 with redo 10/15/18; L4-5 PLIF 12/07/20).   Last cardiology visit with Dr. Johney Frame 06/29/2020.  He had an episode of atypical nonexertional chest pain with low suspicion for cardiac etiology.  Continue to monitor.  She did order a repeat echo to follow-up aortic stenosis.  Results showed stable mild to moderate AS.  She wrote, "We will continue to monitor [AS] every 2 years unless he becomes more symptomatic at which point, we will reassess sooner." His next cardiology visit is scheduled for 06/09/21 with Ermalinda Barrios, PA-C. He denied SOB and chest pain per PAT RN visit.    He was started on Plavix after 06/2018 left EIA stent placement. Reported instructions to hold Plavix for 5 days prior to surgery, continue ASA.   His DM is not well controlled. A1c 9.8% 05/24/21, up from 8.6% 01/31/21 [facility]) with A1c range of 8.6%-10.2% in the past 3 years). He reported a continuous sensor (Freestyle Libre 2) with fasting glucose ~ 200. He is on Actos 45 mg daily, repaglinide 2 mg TID, metformin XR 1000 mg BID, Tresiba 60  units SQ Q AM for DM. A1c result routed to Dr. Carlis Abbott for review.   Presurgical COVID-19 test is scheduled for 06/01/21. Anesthesia team to evaluate on the day of surgery.   VS: BP (!) 163/59    Pulse 62    Temp (!) 36.3 C (Oral)    Resp 17    Ht 5\' 10"  (1.778 m)    Wt 77.8 kg    SpO2 100%    BMI 24.61 kg/m    PROVIDERS: Mitchell, [name], MD is PCP [facility]) - Marcial Pacas, MD is neurologist - Monica Martinez, MD is vascular surgeon  - Gwyndolyn Kaufman, MD is cardiologist - Sherley Bounds, MD is neurosurgeon  - Marcial Pacas, MD is neurologist   LABS: Preoperative labs noted. See DISCUSSION. (all labs ordered are listed, but only abnormal results are displayed)  Labs Reviewed  GLUCOSE, CAPILLARY - Abnormal; Notable for the following components:      Result Value   Glucose-Capillary 186 (*)    All other components within normal limits  CBC - Abnormal; Notable for the following components:   RBC 4.11 (*)    Hemoglobin 11.8 (*)    HCT 37.0 (*)    All other components within normal limits  COMPREHENSIVE METABOLIC PANEL - Abnormal; Notable for the following components:   CO2 20 (*)    Glucose, Bld 163 (*)    All other components within normal limits  URINALYSIS, ROUTINE W REFLEX MICROSCOPIC - Abnormal; Notable for the following components:   APPearance HAZY (*)    Glucose,  UA >=500 (*)    Hgb urine dipstick SMALL (*)    Protein, ur 100 (*)    All other components within normal limits  BLOOD GAS, ARTERIAL - Abnormal; Notable for the following components:   pO2, Arterial 125 (*)    All other components within normal limits  HEMOGLOBIN A1C - Abnormal; Notable for the following components:   Hgb A1c MFr Bld 9.8 (*)    All other components within normal limits  SURGICAL PCR SCREEN  PROTIME-INR  APTT  TYPE AND SCREEN     IMAGES: CTA Ao+BiFem 03/09/21: IMPRESSION: 1. Aortic Atherosclerosis (ICD10-170.0) with visceral and renal artery ostial stenoses as above. 2.  Tandem stenoses in RIGHT common and external iliac arteries of at least mild severity. 3. Long segment RIGHT SFA occlusive disease, patent popliteal with apparently contiguous tibial runoff. 4. Patent LEFT external iliac artery stent. 5. Long segment LEFT SFA occlusive disease with segmental occlusion at the adductor hiatus. Popliteal reconstituted above the knee with atheromatous tibial runoff.   CXR 12/05/20: FINDINGS: Stable cardiomediastinal silhouette with normal heart size. No pneumothorax. No pleural effusion. Lungs appear clear, with no acute consolidative airspace disease and no pulmonary edema. IMPRESSION: No active cardiopulmonary disease.     EKG: 12/05/20: Sinus bradycardia with 1st degree A-V block Otherwise normal ECG Confirmed by Eleonore Chiquito [phone]) on 12/05/2020 5:51:33 PM     CV: Aortogram with LLE runoff 03/23/21 Carlis Abbott, Harrell Gave, MD): Findings:  - Aortogram showed patent renal arteries bilaterally with no flow-limiting stenosis in the aortoiliac segment.  The left external iliac stent was widely patent. - Left lower extremity runoff showed a patent common femoral he does have about a 50% proximal profunda stenosis.  The SFA has ostial high-grade stenosis greater than 80% just at the takeoff of the vessel adjacent to the profunda.  The proximal to mid SFA is patent but severely diseased and small.  He has a distal SFA above-knee popliteal short occlusion.  He reconstitutes an above-knee popliteal artery with a patent below-knee popliteal artery and three-vessel runoff. Plan:  I will have the patient vein mapped and follow-up with me in the office to discuss left lower extremity bypass.  I think his most durable option is a left common femoral to below-knee popliteal bypass.  I thought any intervention on the SFA was high risk given the proximal ostial stenosis adjacent to the diseased profunda and he only has claudication symptoms at this time.   Echo  07/18/20: IMPRESSIONS   1. The aortic valve is tricuspid. There is moderate calcification of the  aortic valve. There is moderate thickening of the aortic valve. Aortic  valve regurgitation is not visualized. Mild to moderate aortic valve  stenosis. Aortic valve area, by VTI  measures 1.33 cm. Aortic valve mean gradient measures 18.0 mmHg. Aortic  valve Vmax measures 2.84 m/s.   2. Left ventricular ejection fraction, by estimation, is 60 to 65%. The  left ventricle has normal function. The left ventricle has no regional  wall motion abnormalities. There is mild concentric left ventricular  hypertrophy. Left ventricular diastolic  parameters are consistent with Grade I diastolic dysfunction (impaired  relaxation).   3. Right ventricular systolic function is normal. The right ventricular  size is normal. Tricuspid regurgitation signal is inadequate for assessing  PA pressure.   4. The mitral valve is grossly normal. Trivial mitral valve  regurgitation. No evidence of mitral stenosis.   5. The inferior vena cava is normal in size with greater than  50%  respiratory variability, suggesting right atrial pressure of 3 mmHg.  - Comparison(s): No significant change from prior study. AS remains mild to  moderate when compared with 09/22/2019.     Carotid US 07/12/20: Summary:  Right Carotid: Velocities in the right ICA are consistent with a 1-39%  stenosis. CCA mid >50% based on doubling of velocity, at patch site.  Left Carotid: Velocities in the left ICA are consistent with a 1-39%  stenosis.     He reported a normal stress test 8+ years ago.   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/2009   Dysphagia    Esophageal dysmotility    GERD (gastroesophageal reflux disease)    Heart murmur    History of hiatal hernia     History of Holter monitoring 2009   History of kidney stones    HNP (herniated nucleus pulposus), lumbar    L4-5   HYPERCHOLESTEROLEMIA 11/30/2009   Hyperlipidemia    HYPERTENSION 11/30/2009   Kidney stones    Numbness and tingling    Peripheral vascular disease ([place])    Right carotid bruit    SMOKER 11/30/2009   Stroke ([place]) 2012   URINARY CALCULUS 11/30/2009    Past Surgical History:  Procedure Laterality Date   ABDOMINAL AORTOGRAM W/LOWER EXTREMITY N/A 06/18/2018   Procedure: ABDOMINAL AORTOGRAM W/LOWER EXTREMITY;  Surgeon: Marty Heck, MD;  Location: [place] [facility];  Service: Cardiovascular;  Laterality: N/A;   ABDOMINAL AORTOGRAM W/LOWER EXTREMITY N/A 12/02/2019   Procedure: ABDOMINAL AORTOGRAM W/LOWER EXTREMITY;  Surgeon: Marty Heck, MD;  Location: [place] [facility];  Service: Cardiovascular;  Laterality: N/A;   ABDOMINAL AORTOGRAM W/LOWER EXTREMITY N/A 03/23/2021   Procedure: ABDOMINAL AORTOGRAM W/LOWER EXTREMITY;  Surgeon: Marty Heck, MD;  Location: [place] [facility];  Service: Cardiovascular;  Laterality: N/A;   arthroscopy of right shoulder Right    Jan 2019, June 2019-- done at [place], Dr. Ninfa Linden   BACK SURGERY     CATARACT EXTRACTION W/ INTRAOCULAR LENS  IMPLANT, BILATERAL     CYSTOSCOPY     ENDARTERECTOMY Right 08/24/2019   Procedure: ENDARTERECTOMY CAROTID;  Surgeon: Marty Heck, MD;  Location: [place];  Service: Vascular;  Laterality: Right;   FACIAL COSMETIC SURGERY  2014   FINGER SURGERY  2002   LUMBAR LAMINECTOMY/DECOMPRESSION MICRODISCECTOMY Left 08/01/2018   Procedure: Microdiscectomy - Lumbar four-Lumbar five - left;  Surgeon: Eustace Moore, MD;  Location: [place];  Service: Neurosurgery;  Laterality: Left;   LUMBAR LAMINECTOMY/DECOMPRESSION MICRODISCECTOMY Left 10/15/2018   Procedure: Re-do Microdiscectomy - left - Lumbar four-Lumbar five;  Surgeon: Eustace Moore, MD;  Location: [place];  Service:  Neurosurgery;  Laterality: Left;   NECK SURGERY  1986   PERIPHERAL VASCULAR ATHERECTOMY  12/02/2019   Procedure: PERIPHERAL VASCULAR ATHERECTOMY;  Surgeon: Marty Heck, MD;  Location: [place] [facility];  Service: Cardiovascular;;  left SFA   PERIPHERAL VASCULAR INTERVENTION  06/18/2018   Procedure: PERIPHERAL VASCULAR INTERVENTION;  Surgeon: Marty Heck, MD;  Location: [place] [facility];  Service: Cardiovascular;;  Left external iliac   ROTATOR CUFF REPAIR Left    UMBILICAL HERNIA REPAIR      MEDICATIONS:  amLODipine-benazepril (LOTREL) 10-40 MG per capsule  aspirin EC 81 MG EC tablet   carvedilol (COREG) 6.25 MG tablet   clopidogrel (PLAVIX) 75 MG tablet   Continuous Blood Gluc Sensor (FREESTYLE LIBRE 2 SENSOR) MISC   ferrous sulfate 325 (65 FE) MG tablet   insulin degludec (TRESIBA FLEXTOUCH) 100 UNIT/ML FlexTouch Pen   metFORMIN (GLUCOPHAGE-XR) 500 MG 24 hr tablet   ONETOUCH VERIO test strip   pantoprazole (PROTONIX) 40 MG tablet   pioglitazone (ACTOS) 45 MG tablet   repaglinide (PRANDIN) 2 MG tablet   rosuvastatin (CRESTOR) 40 MG tablet   No current facility-administered medications for this encounter.    Myra Gianotti, PA-C Surgical Short Stay/Anesthesiology [facility] Phone [phone] [facility] Phone [phone] 05/25/2021 6:08 PM

## 2021-05-25 NOTE — Anesthesia Preprocedure Evaluation (Addendum)
Anesthesia Evaluation  Patient identified by MRN, date of birth, ID band Patient awake    Reviewed: Allergy & Precautions, NPO status , Patient's Chart, lab work & pertinent test results  Airway Mallampati: II  TM Distance: >3 FB     Dental   Pulmonary former smoker,    breath sounds clear to auscultation       Cardiovascular hypertension, + Peripheral Vascular Disease   Rhythm:Regular Rate:Normal  Echo 07/18/20: IMPRESSIONS  1. The aortic valve is tricuspid. There is moderate calcification of the  aortic valve. There is moderate thickening of the aortic valve. Aortic  valve regurgitation is not visualized. Mild to moderate aortic valve  stenosis.Aortic valve area, by VTI  measures 1.33 cm. Aortic valve mean gradient measures 18.0 mmHg. Aortic  valve Vmax measures 2.84 m/s.  2. Left ventricular ejection fraction, by estimation, is 60 to 65%. The  left ventricle has normal function. The left ventricle has no regional  wall motion abnormalities. There is mild concentric left ventricular  hypertrophy. Left ventricular diastolic  parameters are consistent with Grade I diastolic dysfunction (impaired  relaxation).  3. Right ventricular systolic function is normal. The right ventricular  size is normal. Tricuspid regurgitation signal is inadequate for assessing  PA pressure.  4. The mitral valve is grossly normal. Trivial mitral valve  regurgitation. No evidence of mitral stenosis.  5. The inferior vena cava is normal in size with greater than 50%  respiratory variability, suggesting right atrial pressure of 3 mmHg.  -Comparison(s): No significant change from prior study. AS remains mild to  moderate when compared with 09/22/2019.      Neuro/Psych    GI/Hepatic Neg liver ROS, hiatal hernia, GERD  ,  Endo/Other  diabetes  Renal/GU Renal disease     Musculoskeletal   Abdominal   Peds  Hematology   Anesthesia  Other Findings   Reproductive/Obstetrics                            Anesthesia Physical Anesthesia Plan  ASA: 3  Anesthesia Plan: General   Post-op Pain Management:    Induction: Intravenous  PONV Risk Score and Plan: Ondansetron, Dexamethasone and Midazolam  Airway Management Planned: Oral ETT  Additional Equipment: Arterial line  Intra-op Plan:   Post-operative Plan: Extubation in OR  Informed Consent: I have reviewed the patients History and Physical, chart, labs and discussed the procedure including the risks, benefits and alternatives for the proposed anesthesia with the patient or authorized representative who has indicated his/her understanding and acceptance.     Dental advisory given  Plan Discussed with: CRNA and Anesthesiologist  Anesthesia Plan Comments: (PAT note written 05/25/2021 by Myra Gianotti, PA-C. )      Anesthesia Quick Evaluation

## 2021-06-01 ENCOUNTER — Other Ambulatory Visit (HOSPITAL_COMMUNITY)
Admission: RE | Admit: 2021-06-01 | Discharge: 2021-06-01 | Disposition: A | Discharge status: Home / Self Care | Payer: HMO | Source: Ambulatory Visit | Attending: Vascular Surgery | Admitting: Vascular Surgery

## 2021-06-01 DIAGNOSIS — Z01812 Encounter for preprocedural laboratory examination: Secondary | ICD-10-CM | POA: Insufficient documentation

## 2021-06-01 DIAGNOSIS — Z20822 Contact with and (suspected) exposure to covid-19: Secondary | ICD-10-CM | POA: Insufficient documentation

## 2021-06-01 DIAGNOSIS — Z01818 Encounter for other preprocedural examination: Secondary | ICD-10-CM

## 2021-06-01 LAB — SARS CORONAVIRUS 2 (TAT 6-24 HRS): SARS Coronavirus 2: NEGATIVE

## 2021-06-05 ENCOUNTER — Other Ambulatory Visit: Payer: Self-pay

## 2021-06-05 ENCOUNTER — Inpatient Hospital Stay (HOSPITAL_COMMUNITY)
Admission: RE | Admit: 2021-06-05 | Discharge: 2021-06-07 | DRG: 254 | Disposition: A | Discharge status: Home / Self Care | Payer: HMO | Attending: Vascular Surgery | Admitting: Vascular Surgery

## 2021-06-05 ENCOUNTER — Inpatient Hospital Stay (HOSPITAL_COMMUNITY): Payer: HMO | Admitting: Physician Assistant

## 2021-06-05 ENCOUNTER — Encounter (HOSPITAL_COMMUNITY)
Admission: RE | Disposition: A | Discharge status: Home / Self Care | Payer: Self-pay | Source: Home / Self Care | Attending: Vascular Surgery

## 2021-06-05 ENCOUNTER — Inpatient Hospital Stay (HOSPITAL_COMMUNITY): Payer: HMO | Admitting: Certified Registered Nurse Anesthetist

## 2021-06-05 ENCOUNTER — Encounter (HOSPITAL_COMMUNITY): Payer: Self-pay | Admitting: Vascular Surgery

## 2021-06-05 DIAGNOSIS — K219 Gastro-esophageal reflux disease without esophagitis: Secondary | ICD-10-CM | POA: Diagnosis not present

## 2021-06-05 DIAGNOSIS — E7800 Pure hypercholesterolemia, unspecified: Secondary | ICD-10-CM | POA: Diagnosis not present

## 2021-06-05 DIAGNOSIS — Z87891 Personal history of nicotine dependence: Secondary | ICD-10-CM | POA: Diagnosis not present

## 2021-06-05 DIAGNOSIS — Z8673 Personal history of transient ischemic attack (TIA), and cerebral infarction without residual deficits: Secondary | ICD-10-CM

## 2021-06-05 DIAGNOSIS — Z87442 Personal history of urinary calculi: Secondary | ICD-10-CM | POA: Diagnosis not present

## 2021-06-05 DIAGNOSIS — Z7902 Long term (current) use of antithrombotics/antiplatelets: Secondary | ICD-10-CM

## 2021-06-05 DIAGNOSIS — Z85828 Personal history of other malignant neoplasm of skin: Secondary | ICD-10-CM

## 2021-06-05 DIAGNOSIS — I10 Essential (primary) hypertension: Secondary | ICD-10-CM | POA: Diagnosis present

## 2021-06-05 DIAGNOSIS — I70212 Atherosclerosis of native arteries of extremities with intermittent claudication, left leg: Secondary | ICD-10-CM | POA: Diagnosis present

## 2021-06-05 DIAGNOSIS — E1165 Type 2 diabetes mellitus with hyperglycemia: Secondary | ICD-10-CM | POA: Diagnosis not present

## 2021-06-05 DIAGNOSIS — Z885 Allergy status to narcotic agent status: Secondary | ICD-10-CM | POA: Diagnosis not present

## 2021-06-05 DIAGNOSIS — Z794 Long term (current) use of insulin: Secondary | ICD-10-CM | POA: Diagnosis not present

## 2021-06-05 DIAGNOSIS — Z79899 Other long term (current) drug therapy: Secondary | ICD-10-CM | POA: Diagnosis not present

## 2021-06-05 DIAGNOSIS — N400 Enlarged prostate without lower urinary tract symptoms: Secondary | ICD-10-CM | POA: Diagnosis not present

## 2021-06-05 DIAGNOSIS — M1990 Unspecified osteoarthritis, unspecified site: Secondary | ICD-10-CM | POA: Diagnosis present

## 2021-06-05 DIAGNOSIS — E1151 Type 2 diabetes mellitus with diabetic peripheral angiopathy without gangrene: Secondary | ICD-10-CM | POA: Diagnosis not present

## 2021-06-05 DIAGNOSIS — Z8249 Family history of ischemic heart disease and other diseases of the circulatory system: Secondary | ICD-10-CM | POA: Diagnosis not present

## 2021-06-05 DIAGNOSIS — I350 Nonrheumatic aortic (valve) stenosis: Secondary | ICD-10-CM | POA: Diagnosis present

## 2021-06-05 DIAGNOSIS — I739 Peripheral vascular disease, unspecified: Secondary | ICD-10-CM | POA: Diagnosis present

## 2021-06-05 DIAGNOSIS — Z7982 Long term (current) use of aspirin: Secondary | ICD-10-CM

## 2021-06-05 DIAGNOSIS — Z823 Family history of stroke: Secondary | ICD-10-CM

## 2021-06-05 DIAGNOSIS — Z7984 Long term (current) use of oral hypoglycemic drugs: Secondary | ICD-10-CM | POA: Diagnosis not present

## 2021-06-05 DIAGNOSIS — Z833 Family history of diabetes mellitus: Secondary | ICD-10-CM | POA: Diagnosis not present

## 2021-06-05 HISTORY — PX: ENDARTERECTOMY FEMORAL: SHX5804

## 2021-06-05 HISTORY — PX: PATCH ANGIOPLASTY: SHX6230

## 2021-06-05 HISTORY — PX: FEMORAL-POPLITEAL BYPASS GRAFT: SHX937

## 2021-06-05 LAB — POCT ACTIVATED CLOTTING TIME
Activated Clotting Time: 131 seconds
Activated Clotting Time: 209 seconds
Activated Clotting Time: 287 seconds
Activated Clotting Time: 257 seconds
Activated Clotting Time: 149 seconds

## 2021-06-05 LAB — CBC
HCT: 31.9 % — ABNORMAL LOW (ref 39.0–52.0)
Hemoglobin: 10.5 g/dL — ABNORMAL LOW (ref 13.0–17.0)
MCH: 29.1 pg (ref 26.0–34.0)
MCHC: 32.9 g/dL (ref 30.0–36.0)
MCV: 88.4 fL (ref 80.0–100.0)
Platelets: 232 10*3/uL (ref 150–400)
RBC: 3.61 MIL/uL — ABNORMAL LOW (ref 4.22–5.81)
RDW: 14.6 % (ref 11.5–15.5)
WBC: 12.6 10*3/uL — ABNORMAL HIGH (ref 4.0–10.5)
nRBC: 0 % (ref 0.0–0.2)

## 2021-06-05 LAB — GLUCOSE, CAPILLARY
Glucose-Capillary: 301 mg/dL — ABNORMAL HIGH (ref 70–99)
Glucose-Capillary: 129 mg/dL — ABNORMAL HIGH (ref 70–99)
Glucose-Capillary: 181 mg/dL — ABNORMAL HIGH (ref 70–99)
Glucose-Capillary: 282 mg/dL — ABNORMAL HIGH (ref 70–99)

## 2021-06-05 LAB — POCT I-STAT, CHEM 8
BUN: 14 mg/dL (ref 8–23)
Calcium, Ion: 1.27 mmol/L (ref 1.15–1.40)
Chloride: 105 mmol/L (ref 98–111)
Creatinine, Ser: 0.5 mg/dL — ABNORMAL LOW (ref 0.61–1.24)
Glucose, Bld: 148 mg/dL — ABNORMAL HIGH (ref 70–99)
HCT: 35 % — ABNORMAL LOW (ref 39.0–52.0)
Hemoglobin: 11.9 g/dL — ABNORMAL LOW (ref 13.0–17.0)
Potassium: 3.4 mmol/L — ABNORMAL LOW (ref 3.5–5.1)
Sodium: 141 mmol/L (ref 135–145)
TCO2: 24 mmol/L (ref 22–32)

## 2021-06-05 LAB — CREATININE, SERUM
Creatinine, Ser: 0.71 mg/dL (ref 0.61–1.24)
GFR, Estimated: >60 mL/min (ref >60)

## 2021-06-05 LAB — HEMOGLOBIN A1C
Hgb A1c MFr Bld: 9.7 % — ABNORMAL HIGH (ref 4.8–5.6)
Mean Plasma Glucose: 231.69 mg/dL

## 2021-06-05 SURGERY — BYPASS GRAFT FEMORAL-POPLITEAL ARTERY
Anesthesia: General | Site: Leg Upper | Laterality: Left

## 2021-06-05 MED ORDER — ROCURONIUM BROMIDE 10 MG/ML (PF) SYRINGE
PREFILLED_SYRINGE | INTRAVENOUS | Status: DC | PRN
Start: 2021-06-05 — End: 2021-06-05
  Administered 2021-06-05: 20 mg via INTRAVENOUS
  Administered 2021-06-05: 100 mg via INTRAVENOUS

## 2021-06-05 MED ORDER — FERROUS SULFATE 325 (65 FE) MG PO TABS
325.0000 mg | ORAL_TABLET | Freq: Every day | ORAL | Status: DC
  Administered 2021-06-06 – 2021-06-07 (×2): 325 mg via ORAL
  Filled 2021-06-05 (×2): qty 1

## 2021-06-05 MED ORDER — FENTANYL CITRATE (PF) 250 MCG/5ML IJ SOLN
INTRAMUSCULAR | Status: DC | PRN
Start: 2021-06-05 — End: 2021-06-05
  Administered 2021-06-05 (×2): 25 ug via INTRAVENOUS
  Administered 2021-06-05: 50 ug via INTRAVENOUS
  Administered 2021-06-05: 25 ug via INTRAVENOUS
  Administered 2021-06-05: 100 ug via INTRAVENOUS
  Administered 2021-06-05: 25 ug via INTRAVENOUS

## 2021-06-05 MED ORDER — PANTOPRAZOLE SODIUM 40 MG PO TBEC
40.0000 mg | DELAYED_RELEASE_TABLET | Freq: Every day | ORAL | Status: DC
  Administered 2021-06-06 – 2021-06-07 (×2): 40 mg via ORAL
  Filled 2021-06-05 (×2): qty 1

## 2021-06-05 MED ORDER — SODIUM CHLORIDE 0.9 % IV SOLN: INTRAVENOUS | Status: DC

## 2021-06-05 MED ORDER — CHLORHEXIDINE GLUCONATE 0.12 % MT SOLN: 15.0000 mL | Freq: Once | OROMUCOSAL | Status: AC

## 2021-06-05 MED ORDER — 0.9 % SODIUM CHLORIDE (POUR BTL) OPTIME
TOPICAL | Status: DC | PRN
  Administered 2021-06-05: 250 mL
  Administered 2021-06-05: 2000 mL

## 2021-06-05 MED ORDER — ASPIRIN EC 81 MG PO TBEC
81.0000 mg | DELAYED_RELEASE_TABLET | Freq: Every day | ORAL | Status: DC
  Administered 2021-06-06 – 2021-06-07 (×2): 81 mg via ORAL
  Filled 2021-06-05 (×2): qty 1

## 2021-06-05 MED ORDER — HEPARIN SODIUM (PORCINE) 5000 UNIT/ML IJ SOLN
5000.0000 [IU] | Freq: Three times a day (TID) | INTRAMUSCULAR | Status: DC
  Administered 2021-06-05 – 2021-06-07 (×5): 5000 [IU] via SUBCUTANEOUS
  Filled 2021-06-05 (×5): qty 1

## 2021-06-05 MED ORDER — DOCUSATE SODIUM 100 MG PO CAPS
100.0000 mg | ORAL_CAPSULE | Freq: Every day | ORAL | Status: DC
  Administered 2021-06-06 – 2021-06-07 (×2): 100 mg via ORAL
  Filled 2021-06-05 (×2): qty 1

## 2021-06-05 MED ORDER — CEFAZOLIN SODIUM-DEXTROSE 2-4 GM/100ML-% IV SOLN
INTRAVENOUS | Status: AC
  Filled 2021-06-05: qty 100

## 2021-06-05 MED ORDER — PHENYLEPHRINE HCL-NACL 20-0.9 MG/250ML-% IV SOLN
INTRAVENOUS | Status: DC | PRN
  Administered 2021-06-05: 30 ug/min via INTRAVENOUS

## 2021-06-05 MED ORDER — SUGAMMADEX SODIUM 200 MG/2ML IV SOLN
INTRAVENOUS | Status: DC | PRN
Start: 2021-06-05 — End: 2021-06-05
  Administered 2021-06-05: 150 mg via INTRAVENOUS

## 2021-06-05 MED ORDER — PANTOPRAZOLE SODIUM 40 MG PO TBEC: 40.0000 mg | DELAYED_RELEASE_TABLET | Freq: Every day | ORAL | Status: DC

## 2021-06-05 MED ORDER — FENTANYL CITRATE (PF) 100 MCG/2ML IJ SOLN: 25.0000 ug | INTRAMUSCULAR | Status: DC | PRN

## 2021-06-05 MED ORDER — PHENYLEPHRINE HCL (PRESSORS) 10 MG/ML IV SOLN
INTRAVENOUS | Status: DC | PRN
Start: 2021-06-05 — End: 2021-06-05
  Administered 2021-06-05 (×2): 40 ug via INTRAVENOUS
  Administered 2021-06-05: 80 ug via INTRAVENOUS
  Administered 2021-06-05 (×3): 40 ug via INTRAVENOUS

## 2021-06-05 MED ORDER — ROCURONIUM BROMIDE 10 MG/ML (PF) SYRINGE
PREFILLED_SYRINGE | INTRAVENOUS | Status: AC
  Filled 2021-06-05: qty 10

## 2021-06-05 MED ORDER — HEPARIN SODIUM (PORCINE) 1000 UNIT/ML IJ SOLN
INTRAMUSCULAR | Status: DC | PRN
  Administered 2021-06-05: 3000 [IU] via INTRAVENOUS
  Administered 2021-06-05: 8000 [IU] via INTRAVENOUS

## 2021-06-05 MED ORDER — PROTAMINE SULFATE 10 MG/ML IV SOLN
INTRAVENOUS | Status: DC | PRN
  Administered 2021-06-05 (×5): 10 mg via INTRAVENOUS

## 2021-06-05 MED ORDER — OXYCODONE-ACETAMINOPHEN 5-325 MG PO TABS
1.0000 | ORAL_TABLET | ORAL | Status: DC | PRN
  Administered 2021-06-05: 2 via ORAL
  Administered 2021-06-06 (×2): 1 via ORAL
  Administered 2021-06-06 – 2021-06-07 (×2): 2 via ORAL
  Filled 2021-06-05 (×2): qty 1
  Filled 2021-06-05 (×3): qty 2

## 2021-06-05 MED ORDER — HYDROMORPHONE HCL 1 MG/ML IJ SOLN
0.5000 mg | INTRAMUSCULAR | Status: DC | PRN
  Administered 2021-06-05: 1 mg via INTRAVENOUS
  Filled 2021-06-05: qty 1

## 2021-06-05 MED ORDER — ROSUVASTATIN CALCIUM 20 MG PO TABS
40.0000 mg | ORAL_TABLET | Freq: Every day | ORAL | Status: DC
  Administered 2021-06-06 – 2021-06-07 (×2): 40 mg via ORAL
  Filled 2021-06-05 (×2): qty 2

## 2021-06-05 MED ORDER — CARVEDILOL 6.25 MG PO TABS
6.2500 mg | ORAL_TABLET | Freq: Two times a day (BID) | ORAL | Status: DC
  Administered 2021-06-05 – 2021-06-07 (×4): 6.25 mg via ORAL
  Filled 2021-06-05 (×4): qty 1

## 2021-06-05 MED ORDER — BISACODYL 5 MG PO TBEC: 5.0000 mg | DELAYED_RELEASE_TABLET | Freq: Every day | ORAL | Status: DC | PRN

## 2021-06-05 MED ORDER — ORAL CARE MOUTH RINSE: 15.0000 mL | Freq: Once | OROMUCOSAL | Status: AC

## 2021-06-05 MED ORDER — LACTATED RINGERS IV SOLN
INTRAVENOUS | Status: DC | PRN
Start: 2021-06-05 — End: 2021-06-05

## 2021-06-05 MED ORDER — PIOGLITAZONE HCL 30 MG PO TABS
45.0000 mg | ORAL_TABLET | Freq: Every day | ORAL | Status: DC
  Administered 2021-06-06 – 2021-06-07 (×2): 45 mg via ORAL
  Filled 2021-06-05 (×2): qty 1

## 2021-06-05 MED ORDER — INSULIN DEGLUDEC 100 UNIT/ML ~~LOC~~ SOPN: 60.0000 [IU] | PEN_INJECTOR | Freq: Every morning | SUBCUTANEOUS | Status: DC

## 2021-06-05 MED ORDER — FENTANYL CITRATE (PF) 250 MCG/5ML IJ SOLN
INTRAMUSCULAR | Status: AC
  Filled 2021-06-05: qty 5

## 2021-06-05 MED ORDER — HEPARIN 6000 UNIT IRRIGATION SOLUTION
Status: DC | PRN
  Administered 2021-06-05: 1

## 2021-06-05 MED ORDER — PROPOFOL 10 MG/ML IV BOLUS
INTRAVENOUS | Status: DC | PRN
  Administered 2021-06-05: 130 mg via INTRAVENOUS
  Administered 2021-06-05: 30 mg via INTRAVENOUS

## 2021-06-05 MED ORDER — INSULIN ASPART 100 UNIT/ML IJ SOLN
8.0000 [IU] | Freq: Once | INTRAMUSCULAR | Status: AC
  Administered 2021-06-05: 8 [IU] via SUBCUTANEOUS

## 2021-06-05 MED ORDER — LACTATED RINGERS IV SOLN: INTRAVENOUS | Status: DC

## 2021-06-05 MED ORDER — CHLORHEXIDINE GLUCONATE 0.12 % MT SOLN
OROMUCOSAL | Status: AC
  Administered 2021-06-05: 15 mL via OROMUCOSAL
  Filled 2021-06-05: qty 15

## 2021-06-05 MED ORDER — INSULIN ASPART 100 UNIT/ML IJ SOLN
INTRAMUSCULAR | Status: AC
  Filled 2021-06-05: qty 1

## 2021-06-05 MED ORDER — ONDANSETRON HCL 4 MG/2ML IJ SOLN
INTRAMUSCULAR | Status: AC
  Filled 2021-06-05: qty 2

## 2021-06-05 MED ORDER — POTASSIUM CHLORIDE CRYS ER 20 MEQ PO TBCR: 20.0000 meq | EXTENDED_RELEASE_TABLET | Freq: Every day | ORAL | Status: DC | PRN

## 2021-06-05 MED ORDER — SODIUM CHLORIDE 0.9 % IV SOLN: 500.0000 mL | Freq: Once | INTRAVENOUS | Status: DC | PRN

## 2021-06-05 MED ORDER — INSULIN ASPART 100 UNIT/ML IJ SOLN
0.0000 [IU] | Freq: Three times a day (TID) | INTRAMUSCULAR | Status: DC
  Administered 2021-06-05: 3 [IU] via SUBCUTANEOUS
  Administered 2021-06-06 (×2): 2 [IU] via SUBCUTANEOUS
  Administered 2021-06-06: 5 [IU] via SUBCUTANEOUS

## 2021-06-05 MED ORDER — PHENOL 1.4 % MT LIQD: 1.0000 | OROMUCOSAL | Status: DC | PRN

## 2021-06-05 MED ORDER — LABETALOL HCL 5 MG/ML IV SOLN: 10.0000 mg | INTRAVENOUS | Status: DC | PRN

## 2021-06-05 MED ORDER — HEPARIN 6000 UNIT IRRIGATION SOLUTION
Status: AC
  Filled 2021-06-05: qty 500

## 2021-06-05 MED ORDER — GLYCOPYRROLATE PF 0.2 MG/ML IJ SOSY
PREFILLED_SYRINGE | INTRAMUSCULAR | Status: AC
  Filled 2021-06-05: qty 1

## 2021-06-05 MED ORDER — CEFAZOLIN SODIUM-DEXTROSE 2-4 GM/100ML-% IV SOLN
2.0000 g | INTRAVENOUS | Status: AC
  Administered 2021-06-05: 2 g via INTRAVENOUS

## 2021-06-05 MED ORDER — ACETAMINOPHEN 650 MG RE SUPP: 325.0000 mg | RECTAL | Status: DC | PRN

## 2021-06-05 MED ORDER — PHENYLEPHRINE HCL-NACL 20-0.9 MG/250ML-% IV SOLN: INTRAVENOUS | Status: DC | PRN

## 2021-06-05 MED ORDER — DEXAMETHASONE SODIUM PHOSPHATE 10 MG/ML IJ SOLN
INTRAMUSCULAR | Status: DC | PRN
  Administered 2021-06-05: 10 mg via INTRAVENOUS

## 2021-06-05 MED ORDER — ONDANSETRON HCL 4 MG/2ML IJ SOLN
INTRAMUSCULAR | Status: DC | PRN
  Administered 2021-06-05: 4 mg via INTRAVENOUS

## 2021-06-05 MED ORDER — GUAIFENESIN-DM 100-10 MG/5ML PO SYRP: 15.0000 mL | ORAL_SOLUTION | ORAL | Status: DC | PRN

## 2021-06-05 MED ORDER — CEFAZOLIN SODIUM-DEXTROSE 2-4 GM/100ML-% IV SOLN
2.0000 g | Freq: Three times a day (TID) | INTRAVENOUS | Status: AC
  Administered 2021-06-05 (×2): 2 g via INTRAVENOUS
  Filled 2021-06-05 (×2): qty 100

## 2021-06-05 MED ORDER — SENNOSIDES-DOCUSATE SODIUM 8.6-50 MG PO TABS: 1.0000 | ORAL_TABLET | Freq: Every evening | ORAL | Status: DC | PRN

## 2021-06-05 MED ORDER — MAGNESIUM CITRATE PO SOLN
1.0000 | Freq: Once | ORAL | Status: DC | PRN
  Filled 2021-06-05: qty 296

## 2021-06-05 MED ORDER — AMLODIPINE BESYLATE 10 MG PO TABS
10.0000 mg | ORAL_TABLET | Freq: Every day | ORAL | Status: DC
  Administered 2021-06-05 – 2021-06-07 (×3): 10 mg via ORAL
  Filled 2021-06-05 (×3): qty 1

## 2021-06-05 MED ORDER — HEPARIN SODIUM (PORCINE) 1000 UNIT/ML IJ SOLN
INTRAMUSCULAR | Status: AC
  Filled 2021-06-05: qty 10

## 2021-06-05 MED ORDER — METFORMIN HCL ER 500 MG PO TB24
1000.0000 mg | ORAL_TABLET | Freq: Two times a day (BID) | ORAL | Status: DC
  Administered 2021-06-06 – 2021-06-07 (×3): 1000 mg via ORAL
  Filled 2021-06-05 (×4): qty 2

## 2021-06-05 MED ORDER — MAGNESIUM SULFATE 2 GM/50ML IV SOLN: 2.0000 g | Freq: Every day | INTRAVENOUS | Status: DC | PRN

## 2021-06-05 MED ORDER — HYDRALAZINE HCL 20 MG/ML IJ SOLN: 5.0000 mg | INTRAMUSCULAR | Status: DC | PRN

## 2021-06-05 MED ORDER — PROTAMINE SULFATE 10 MG/ML IV SOLN
INTRAVENOUS | Status: AC
  Filled 2021-06-05: qty 5

## 2021-06-05 MED ORDER — ACETAMINOPHEN 325 MG PO TABS: 325.0000 mg | ORAL_TABLET | ORAL | Status: DC | PRN

## 2021-06-05 MED ORDER — ALUM & MAG HYDROXIDE-SIMETH 200-200-20 MG/5ML PO SUSP: 15.0000 mL | ORAL | Status: DC | PRN

## 2021-06-05 MED ORDER — BENAZEPRIL HCL 5 MG PO TABS
40.0000 mg | ORAL_TABLET | Freq: Every day | ORAL | Status: DC
  Administered 2021-06-06 – 2021-06-07 (×2): 40 mg via ORAL
  Filled 2021-06-05 (×2): qty 8

## 2021-06-05 MED ORDER — CHLORHEXIDINE GLUCONATE CLOTH 2 % EX PADS: 6.0000 | MEDICATED_PAD | Freq: Once | CUTANEOUS | Status: DC

## 2021-06-05 MED ORDER — GLYCOPYRROLATE 0.2 MG/ML IJ SOLN
INTRAMUSCULAR | Status: DC | PRN
  Administered 2021-06-05: .2 mg via INTRAVENOUS

## 2021-06-05 MED ORDER — ONDANSETRON HCL 4 MG/2ML IJ SOLN: 4.0000 mg | Freq: Four times a day (QID) | INTRAMUSCULAR | Status: DC | PRN

## 2021-06-05 MED ORDER — METOPROLOL TARTRATE 5 MG/5ML IV SOLN: 2.0000 mg | INTRAVENOUS | Status: DC | PRN

## 2021-06-05 MED ORDER — PROPOFOL 10 MG/ML IV BOLUS
INTRAVENOUS | Status: AC
  Filled 2021-06-05: qty 20

## 2021-06-05 MED ORDER — LIDOCAINE 2% (20 MG/ML) 5 ML SYRINGE
INTRAMUSCULAR | Status: DC | PRN
  Administered 2021-06-05: 60 mg via INTRAVENOUS

## 2021-06-05 MED ORDER — REPAGLINIDE 1 MG PO TABS
2.0000 mg | ORAL_TABLET | Freq: Three times a day (TID) | ORAL | Status: DC
  Administered 2021-06-06 – 2021-06-07 (×3): 2 mg via ORAL
  Filled 2021-06-05: qty 2
  Filled 2021-06-05: qty 1
  Filled 2021-06-05 (×4): qty 2

## 2021-06-05 MED ORDER — CHLORHEXIDINE GLUCONATE CLOTH 2 % EX PADS
6.0000 | MEDICATED_PAD | Freq: Once | CUTANEOUS | Status: DC
Start: 2021-06-05 — End: 2021-06-05

## 2021-06-05 MED ORDER — DEXAMETHASONE SODIUM PHOSPHATE 10 MG/ML IJ SOLN
INTRAMUSCULAR | Status: AC
  Filled 2021-06-05: qty 1

## 2021-06-05 MED ORDER — INSULIN GLARGINE-YFGN 100 UNIT/ML ~~LOC~~ SOLN
60.0000 [IU] | Freq: Every day | SUBCUTANEOUS | Status: DC
  Administered 2021-06-06 – 2021-06-07 (×2): 60 [IU] via SUBCUTANEOUS
  Filled 2021-06-05 (×2): qty 0.6

## 2021-06-05 MED ORDER — LIDOCAINE 2% (20 MG/ML) 5 ML SYRINGE
INTRAMUSCULAR | Status: AC
  Filled 2021-06-05: qty 5

## 2021-06-05 SURGICAL SUPPLY — 66 items
BAG COUNTER SPONGE SURGICOUNT (BAG) ×3 IMPLANT
BANDAGE ESMARK 6X9 LF (GAUZE/BANDAGES/DRESSINGS) IMPLANT
BLADE CLIPPER SURG (BLADE) ×3 IMPLANT
BNDG ESMARK 6X9 LF (GAUZE/BANDAGES/DRESSINGS)
CANISTER SUCT 3000ML PPV (MISCELLANEOUS) ×3 IMPLANT
CANNULA VESSEL 3MM 2 BLNT TIP (CANNULA) ×1 IMPLANT
CLIP FOGARTY SPRING 6M (CLIP) ×2 IMPLANT
CLIP VESOCCLUDE MED 24/CT (CLIP) ×3 IMPLANT
CLIP VESOCCLUDE SM WIDE 24/CT (CLIP) ×3 IMPLANT
COVER PROBE W GEL 5X96 (DRAPES) ×3 IMPLANT
CUFF TOURN SGL QUICK 24 (TOURNIQUET CUFF)
CUFF TOURN SGL QUICK 34 (TOURNIQUET CUFF)
CUFF TOURN SGL QUICK 42 (TOURNIQUET CUFF) IMPLANT
CUFF TRNQT CYL 24X4X16.5-23 (TOURNIQUET CUFF) IMPLANT
CUFF TRNQT CYL 34X4.125X (TOURNIQUET CUFF) IMPLANT
DERMABOND ADVANCED (GAUZE/BANDAGES/DRESSINGS) ×2
DERMABOND ADVANCED .7 DNX12 (GAUZE/BANDAGES/DRESSINGS) ×2 IMPLANT
DRAIN CHANNEL 15F RND FF W/TCR (WOUND CARE) IMPLANT
DRAPE C-ARM 42X72 X-RAY (DRAPES) ×2 IMPLANT
DRAPE HALF SHEET 40X57 (DRAPES) IMPLANT
ELECT REM PT RETURN 9FT ADLT (ELECTROSURGICAL) ×3
ELECTRODE REM PT RTRN 9FT ADLT (ELECTROSURGICAL) ×2 IMPLANT
EVACUATOR SILICONE 100CC (DRAIN) IMPLANT
GAUZE 4X4 16PLY ~~LOC~~+RFID DBL (SPONGE) ×1 IMPLANT
GLOVE SRG 8 PF TXTR STRL LF DI (GLOVE) ×2 IMPLANT
GLOVE SURG ENC MOIS LTX SZ7.5 (GLOVE) ×3 IMPLANT
GLOVE SURG UNDER POLY LF SZ8 (GLOVE) ×3
GOWN STRL REUS W/ TWL LRG LVL3 (GOWN DISPOSABLE) ×4 IMPLANT
GOWN STRL REUS W/ TWL XL LVL3 (GOWN DISPOSABLE) ×4 IMPLANT
GOWN STRL REUS W/TWL LRG LVL3 (GOWN DISPOSABLE) ×6
GOWN STRL REUS W/TWL XL LVL3 (GOWN DISPOSABLE) ×6
GRAFT PROPATEN W/RING 6X80X60 (Vascular Products) ×1 IMPLANT
HEMOSTAT SPONGE AVITENE ULTRA (HEMOSTASIS) IMPLANT
INSERT FOGARTY SM (MISCELLANEOUS) IMPLANT
KIT BASIN OR (CUSTOM PROCEDURE TRAY) ×3 IMPLANT
KIT TURNOVER KIT B (KITS) ×3 IMPLANT
LOOP VESSEL MINI RED (MISCELLANEOUS) ×2 IMPLANT
NS IRRIG 1000ML POUR BTL (IV SOLUTION) ×6 IMPLANT
PACK PERIPHERAL VASCULAR (CUSTOM PROCEDURE TRAY) ×3 IMPLANT
PAD ARMBOARD 7.5X6 YLW CONV (MISCELLANEOUS) ×6 IMPLANT
PATCH VASC XENOSURE 1CMX6CM (Vascular Products) ×3 IMPLANT
PATCH VASC XENOSURE 1X6 (Vascular Products) IMPLANT
PENCIL BUTTON HOLSTER BLD 10FT (ELECTRODE) ×1 IMPLANT
SET MICROPUNCTURE 5F STIFF (MISCELLANEOUS) IMPLANT
STOPCOCK 4 WAY LG BORE MALE ST (IV SETS) IMPLANT
SUT ETHILON 3 0 PS 1 (SUTURE) IMPLANT
SUT GORETEX 5 0 TT13 24 (SUTURE) IMPLANT
SUT GORETEX 6.0 TT13 (SUTURE) IMPLANT
SUT MNCRL AB 4-0 PS2 18 (SUTURE) ×9 IMPLANT
SUT PROLENE 5 0 C 1 24 (SUTURE) ×7 IMPLANT
SUT PROLENE 6 0 BV (SUTURE) ×7 IMPLANT
SUT PROLENE 7 0 BV 1 (SUTURE) IMPLANT
SUT SILK 2 0 PERMA HAND 18 BK (SUTURE) IMPLANT
SUT SILK 2 0 SH (SUTURE) ×1 IMPLANT
SUT SILK 3 0 (SUTURE)
SUT SILK 3-0 18XBRD TIE 12 (SUTURE) IMPLANT
SUT VIC AB 2-0 CT1 27 (SUTURE) ×6
SUT VIC AB 2-0 CT1 TAPERPNT 27 (SUTURE) ×4 IMPLANT
SUT VIC AB 3-0 SH 27 (SUTURE) ×12
SUT VIC AB 3-0 SH 27X BRD (SUTURE) ×6 IMPLANT
TAPE UMBILICAL COTTON 1/8X30 (MISCELLANEOUS) IMPLANT
TOWEL GREEN STERILE (TOWEL DISPOSABLE) ×3 IMPLANT
TRAY FOLEY MTR SLVR 16FR STAT (SET/KITS/TRAYS/PACK) ×3 IMPLANT
TUBING EXTENTION W/L.L. (IV SETS) IMPLANT
UNDERPAD 30X36 HEAVY ABSORB (UNDERPADS AND DIAPERS) ×3 IMPLANT
WATER STERILE IRR 1000ML POUR (IV SOLUTION) ×3 IMPLANT

## 2021-06-05 NOTE — Anesthesia Postprocedure Evaluation (Signed)
Anesthesia Post Note  Patient: MAXIMUS HOFFERT  Procedure(s) Performed: LEFT FEMORAL-POPLITEAL ARTERY BYPASS GRAFTING USING 62mm PROPATEN VASCULAR REMOVABLE RING GRAFT (Left: Leg Lower) PATCH ANGIOPLASTY USING XENOSURE BIOLOGIC PATCH (Left: Leg Lower) ENDARTERECTOMY FEMORAL WITH PROFUNDAPLASTY (Left: Leg Upper)     Patient location during evaluation: PACU Anesthesia Type: General and Spinal Level of consciousness: awake Pain management: pain level controlled Respiratory status: spontaneous breathing Cardiovascular status: stable Postop Assessment: no apparent nausea or vomiting Anesthetic complications: no   No notable events documented.  Last Vitals:  Vitals:   06/05/21 1105 06/05/21 1120  BP: (!) 137/54 (!) 129/47  Pulse: 69 65  Resp: 13 16  Temp: 36.5 C   SpO2: 99% 95%    Last Pain:  Vitals:   06/05/21 1120  TempSrc:   PainSc: Asleep                 Jameis Newsham

## 2021-06-05 NOTE — Anesthesia Procedure Notes (Signed)
Arterial Line Insertion Start/End1/01/2022 7:15 AM Performed by: Maude Leriche, CRNA, CRNA  Patient location: Pre-op. Preanesthetic checklist: patient identified, IV checked, site marked, risks and benefits discussed, surgical consent, monitors and equipment checked, pre-op evaluation, timeout performed and anesthesia consent Lidocaine 1% used for infiltration Left, radial was placed Catheter size: 20 G Hand hygiene performed  and maximum sterile barriers used  Allen's test indicative of satisfactory collateral circulation Attempts: 2 Procedure performed using ultrasound guided technique. Ultrasound Notes:anatomy identified, needle tip was noted to be adjacent to the nerve/plexus identified and no ultrasound evidence of intravascular and/or intraneural injection Following insertion, Biopatch and dressing applied. Post procedure assessment: unchanged  Patient tolerated the procedure well with no immediate complications.

## 2021-06-05 NOTE — Op Note (Signed)
Date: June 05, 2021  Preoperative diagnosis: Lifestyle limiting claudication left lower extremity  Postoperative diagnosis: Same  Procedure: 1.  Left common femoral endarterectomy with profundoplasty and endarterectomy of the proximal SFA with bovine pericardial patch angioplasty 2.  Left common femoral to below-knee popliteal artery bypass with 6 mm ringed PTFE  Surgeon: Dr. Marty Heck, MD  Assistant: Roxy Horseman, PA  Indications: Patient is a 71 year old male who has been followed for left lower extremity claudication and has previously undergone left external iliac stent as well as left SFA popliteal atherectomy with drug-coated balloon angioplasty.  He has had worsening symptoms in the left lower extremity and his ABIs now 0.4.  He presents for left lower extremity bypass after risk benefits discussed and recent angiogram.  I did discuss he had no usable saphenous vein on vein mapping and likely will require prosthetic graft with high risk for infection and poorer durability.  He understands this and wishes to proceed.  Assistance needed for exposure and expedite the case.  Findings: The left common femoral was heavily circumferentially calcified and was not sewable on the anterior wall.  Ultimately endarterectomy of the common femoral artery was performed including eversion endarterectomy of the profunda and endarterectomy of the proximal SFA.  Bovine pericardial patch was sewn to the left common femoral artery and excellent femoral pulse at completion.  I evaluated the vein in the left leg with ultrasound and this was small at the knee and in the calf measuring only about 1 to 1.5 mm.  A 6 mm ringed PTFE graft was sewn from the common femoral artery end to side off the patch angioplasty to the below-knee popliteal artery and tunneled subfascial subsartorial.  Brisk PT signal at completion.  EBL: 150 mL  Anesthesia: General  Details: Patient was taken to the operating  room after informed consent was obtained.  Placed on operative table supine position.  General endotracheal anesthesia was induced.  I initially used SonoSite ultrasound to evaluate the saphenous vein in the left leg and this was of good caliber in the proximal to the mid thigh but then branched and became quite small consistent with preoperative vein mapping.  The vein did not look usable at the knee joint or proximal calf.  The left groin and left leg were then prepped and draped in standard sterile fashion.  Antibiotics were given.  Timeout was performed.  Initially made a horizontal groin incision in the left groin dissected down with Bovie cautery and open edthe femoral sheath longitudinally.  Cerebellar retractors were used for added visualization.  The common femoral artery as well as SFA and profunda were dissected out and controlled Vesseloops as well as the distal external iliac artery.  There was an excellent femoral pulse proximally up by the epigastric branches and a very poor femoral pulse more distally and the artery was circumferentially calcified.  I then went to the below-knee popliteal artery and made a longitudinal incision on the medial calf and I was careful to preserve the saphenous vein here.  We entered the popliteal space and used Meyerding retractors for added visualization.  The below-knee popliteal artery was dissected away from the veins and controlled Vessel loops proximally distally.  I then used the tunneler and tunneled from the below-knee popliteal space subfascial subsartorial up to the common femoral artery.  I then tunneled a 6 mm ringed PTFE graft and was careful to ensure the graft was not twisted.  Patient was given 100 units/kg IV  heparin.  I initially started in the left groin where I used Vesseloops on SFA profunda and Henly clamp on the distal external iliac.  I was careful to clamp distal to the stent.  The left common femoral artery was opened 11 blade scalpel extended  with Potts scissors.  I then performed endarterectomy of the common femoral artery with eversion endarterectomy of the profunda and endarterectomy of the proximal SFA where there was a high-grade stenosis.  Should be noted the distal SFA above-knee popliteal artery is occluded.  This would give options for endovascular intervention in the future.  We then brought a bovine pericardial patch on the field and this was sewn to the left common femoral artery 5-0 Prolene parachute technique.  I did de-air the artery prior to completion.  Excellent femoral pulse once we came off clamp.  I did place several additional patch sutures with 5-0 Prolene in the patch.  I then used my 6 mm ringed PTFE that had already been tunneled.  I used a Cooley clamp on the common femoral artery and this was opened 11 limb blade scalpel and extended Potts scissors.  I then spatulated the graft and an end-to-side anastomosis was sewn to the left common femoral artery patch with 6-0 Prolene parachute technique.  Once we came off clamps we had an excellent pulsatile flow in the graft distally.  I then clamped the graft in the groin and then this was irrigated with heparinized saline to flush the graft.  The leg was straightened and the graft was then cut to the appropriate length.  I then controlled the below-knee popliteal artery with Vesseloops open the artery with 11 blade scalpel and extended arteriotomy with Potts scissors.  I then spatulated the graft after it was cut to the right length and an end-to-side anastomosis was sewn to the left knee popliteal artery.  I did de-air everything prior to completion.  We came off clamps and had an excellent triphasic signal in the below-knee popliteal artery and in the PT at the ankle.  Both groin incisions were then irrigated out and patient was given protamine for reversal.  The groin and below-knee popliteal incision was closed multilayer's of 2-0 Vicryl, 3-0 Vicryl, 4-0 Monocryl and Dermabond.   Taken to recovery in stable condition.  Complication: None  Condition: Stable  Marty Heck, MD [facility] Office: [place]

## 2021-06-05 NOTE — Progress Notes (Signed)
Pt arrived from PACU.  A&Ox4.  CCMD notifed.  CHG given. Vital signs per protocol.  Lefe groin and leg incisions intact w/o hematoma noted.

## 2021-06-05 NOTE — Transfer of Care (Signed)
Immediate Anesthesia Transfer of Care Note  Patient: ZUBAYR BEDNARCZYK  Procedure(s) Performed: LEFT FEMORAL-POPLITEAL ARTERY BYPASS GRAFTING USING 73mm PROPATEN VASCULAR REMOVABLE RING GRAFT (Left: Leg Lower) PATCH ANGIOPLASTY USING XENOSURE BIOLOGIC PATCH (Left: Leg Lower) ENDARTERECTOMY FEMORAL WITH PROFUNDAPLASTY (Left: Leg Upper)  Patient Location: PACU  Anesthesia Type:General  Level of Consciousness: awake, alert  and oriented  Airway & Oxygen Therapy: Patient Spontanous Breathing and Patient connected to nasal cannula oxygen  Post-op Assessment: Report given to RN, Post -op Vital signs reviewed and stable, Patient moving all extremities X 4 and Patient able to stick tongue midline  Post vital signs: Reviewed  Last Vitals:  Vitals Value Taken Time  BP 137/54   Temp 97.6   Pulse 70 06/05/21 1105  Resp 13 06/05/21 1105  SpO2 99 % 06/05/21 1105  Vitals shown include unvalidated device data.  Last Pain:  Vitals:   06/05/21 0637  TempSrc:   PainSc: 0-No pain         Complications: No notable events documented.

## 2021-06-05 NOTE — Anesthesia Postprocedure Evaluation (Signed)
Anesthesia Post Note  Patient: Luis Lindsey  Procedure(s) Performed: LEFT FEMORAL-POPLITEAL ARTERY BYPASS GRAFTING USING 30mm PROPATEN VASCULAR REMOVABLE RING GRAFT (Left: Leg Lower) PATCH ANGIOPLASTY USING XENOSURE BIOLOGIC PATCH (Left: Leg Lower) ENDARTERECTOMY FEMORAL WITH PROFUNDAPLASTY (Left: Leg Upper)     Patient location during evaluation: PACU Anesthesia Type: General Level of consciousness: awake Pain management: pain level controlled Vital Signs Assessment: post-procedure vital signs reviewed and stable Respiratory status: spontaneous breathing Cardiovascular status: stable Postop Assessment: no apparent nausea or vomiting Anesthetic complications: no   No notable events documented.  Last Vitals:  Vitals:   06/05/21 1150 06/05/21 1209  BP: (!) 135/50 (!) 136/51  Pulse:  64  Resp:  16  Temp: 36.6 C 36.6 C  SpO2:  98%    Last Pain:  Vitals:   06/05/21 1209  TempSrc: Oral  PainSc:                  Shantella Blubaugh

## 2021-06-05 NOTE — Anesthesia Procedure Notes (Signed)
Procedure Name: Intubation Date/Time: 06/05/2021 8:02 AM Performed by: Maude Leriche, CRNA Pre-anesthesia Checklist: Patient identified, Emergency Drugs available, Suction available and Patient being monitored Patient Re-evaluated:Patient Re-evaluated prior to induction Oxygen Delivery Method: Circle system utilized Preoxygenation: Pre-oxygenation with 100% oxygen Induction Type: IV induction Ventilation: Mask ventilation without difficulty Laryngoscope Size: Miller and 2 Grade View: Grade I Tube type: Oral Tube size: 7.5 mm Number of attempts: 1 Airway Equipment and Method: Stylet and Oral airway Placement Confirmation: ETT inserted through vocal cords under direct vision, positive ETCO2 and breath sounds checked- equal and bilateral Secured at: 23 cm Tube secured with: Tape Dental Injury: Teeth and Oropharynx as per pre-operative assessment

## 2021-06-05 NOTE — Progress Notes (Signed)
Mobility Specialist: Progress Note   06/05/21 1735  Mobility  Activity Ambulated in room  Level of Assistance Minimal assist, patient does 75% or more  Assistive Device Front wheel walker  Distance Ambulated (ft) 40 ft  Mobility Ambulated with assistance in hallway  Mobility Response Tolerated well  Mobility performed by Mobility specialist  $Mobility charge 1 Mobility   Pre-Mobility: 71 HR, 100% SpO2 Post-Mobility: 76 HR, 97% SpO2  Pt mod I with bed mobility and minA to stand. Pt c/o pain in his LLE during ambulation, no rating given. Pt back to bed per request with call bell and phone at his side.   [facility] Celenia Hruska Mobility Specialist Mobility Specialist 4 [place]: [phone] Mobility Specialist 2 [place]: [phone]

## 2021-06-05 NOTE — Progress Notes (Signed)
[facility] [place]  Subjective  - No new complaints   Objective (!) 156/56 72 98.4 F (36.9 C) (Oral) 16 100%  Intake/Output Summary (Last 24 hours) at 06/05/2021 1547 Last data filed at 06/05/2021 1200 Gross per 24 hour  Intake 1483.75 ml  Output 1150 ml  Net 333.75 ml   Alert and oriented x 3 Groin soft without hematoma, lower leg incision soft. Foot warm with good doppler flow to the DP/PT without ischemic changes Lungs non labored breathing  Assessment/Planning: S/P Left fem-BK popliteal bypass  Bypass is patent with good doppler flow DP/PT Stable disposition  Plavix on hold until tomorrow He will be discharged on Plavix, ASA and Crestor.   Roxy Horseman 06/05/2021 3:47 PM --  Laboratory Lab Results: Recent Labs    06/05/21 0919 06/05/21 1223  WBC  --  12.6*  HGB 11.9* 10.5*  HCT 35.0* 31.9*  PLT  --  232   BMET Recent Labs    06/05/21 0919 06/05/21 1223  NA 141  --   K 3.4*  --   CL 105  --   GLUCOSE 148*  --   BUN 14  --   CREATININE 0.50* 0.71    COAG Lab Results  Component Value Date   INR 1.0 05/24/2021   INR 1.0 12/05/2020   INR 0.9 08/18/2019   No results found for: PTT

## 2021-06-05 NOTE — Progress Notes (Signed)
Inpatient Diabetes Program Recommendations  AACE/ADA: New Consensus Statement on Inpatient Glycemic Control (2015)  Target Ranges:  Prepandial:   less than 140 mg/dL      Peak postprandial:   less than 180 mg/dL (1-2 hours)      Critically ill patients:  140 - 180 mg/dL   Lab Results  Component Value Date   GLUCAP 129 (H) 06/05/2021   HGBA1C 9.8 (H) 05/24/2021    Review of Glycemic Control  Diabetes history: DM2 Outpatient Diabetes medications: Tresiba 60 units QHS, Actos 45 mg QD, Prandin 2 mg TID, Metformin 1000 mg BID Current orders for Inpatient glycemic control: Semglee 60 units QD, Actos 45   Inpatient Diabetes Program Recommendations:    Might consider holding Metformin while inpatient and decreasing Semglee to 20 QD (80% of home dose; had decadron this am).  Will continue to follow while inpatient.  Thank you, Reche Dixon, MSN, RN Diabetes Coordinator Inpatient Diabetes Program [phone] (team pager from 8a-5p)

## 2021-06-05 NOTE — H&P (Signed)
History and Physical Interval Note:  06/05/2021 7:37 AM  Luis Lindsey  has presented today for surgery, with the diagnosis of PAD.  The various methods of treatment have been discussed with the patient and family. After consideration of risks, benefits and other options for treatment, the patient has consented to  Procedure(s): LEFT FEMORAL-POPLITEAL ARTERY BYPASS GRAFTING (Left) as a surgical intervention.  The patient's history has been reviewed, patient examined, no change in status, stable for surgery.  I have reviewed the patient's chart and labs.  Questions were answered to the patient's satisfaction.    Left common femoral to below-knee popliteal bypass for lifestyle limiting claudication.  ABIs 0.41.  Discussed no good vein on vein mapping and likely will require prosthetic graft.  Discussed higher risk of infection and poorer durability.  He wishes to proceed.  Marty Heck  Patient name: Luis Lindsey  MRN: [id number]        DOB: Jan 11, 1951            Sex: male   REASON FOR VISIT: F/U to discuss left leg bypass   HPI: Luis Lindsey is a 71 y.o. male with hx HTN, HLD, DM that presents to discuss left leg bypass after he was recently seen for worsening left leg claudication that started in the summertime when he was walking his dog.  Now he is only able to walk about a block and feels this is getting worse on a weekly basis.  We recently performed angiogram on 03/23/2021 and felt his best option would likely be a bypass and he presents today for vein mapping.  His ABIs 0.41 monophasic on the left.  States that his left calf burning and claudication have become much more disabling.  Now really unable to go to the grocery store and cannot walk his dog.  Can only walk several 100 feet and has to stop.   He previously underwent left external iliac stent on 06/18/2018 for left lower extremity lifestyle limiting claudication.  He later underwent atherectomy and angioplasty of his left SFA  and above-knee popliteal artery on 12/02/2019 including DCB.   He has also previously undergone a right carotid endarterectomy by myself on 08/24/2019.   He is still taking Plavix and aspirin.  He denies any tobacco abuse.         Past Medical History:  Diagnosis Date   Anemia     Aortic stenosis     Arthritis     BCC (basal cell carcinoma)     BPH (benign prostatic hyperplasia)     CAROTID BRUIT, RIGHT 11/30/2009   Dizziness     DM 11/30/2009   Dysphagia     Esophageal dysmotility     GERD (gastroesophageal reflux disease)     Heart murmur     History of hiatal hernia     History of Holter monitoring 2009   History of kidney stones     HNP (herniated nucleus pulposus), lumbar      L4-5   HYPERCHOLESTEROLEMIA 11/30/2009   Hyperlipidemia     HYPERTENSION 11/30/2009   Kidney stones     Numbness and tingling     Peripheral vascular disease ([place])     Right carotid bruit     SMOKER 11/30/2009   Stroke ([place]) 2012   URINARY CALCULUS 11/30/2009           Past Surgical History:  Procedure Laterality Date   ABDOMINAL AORTOGRAM W/LOWER EXTREMITY N/A 06/18/2018    Procedure:  ABDOMINAL AORTOGRAM W/LOWER EXTREMITY;  Surgeon: Marty Heck, MD;  Location: [facility];  Service: Cardiovascular;  Laterality: N/A;   ABDOMINAL AORTOGRAM W/LOWER EXTREMITY N/A 12/02/2019    Procedure: ABDOMINAL AORTOGRAM W/LOWER EXTREMITY;  Surgeon: Marty Heck, MD;  Location: [facility];  Service: Cardiovascular;  Laterality: N/A;   ABDOMINAL AORTOGRAM W/LOWER EXTREMITY N/A 03/23/2021    Procedure: ABDOMINAL AORTOGRAM W/LOWER EXTREMITY;  Surgeon: Marty Heck, MD;  Location: [facility];  Service: Cardiovascular;  Laterality: N/A;   arthroscopy of right shoulder Right      Jan 2019, June 2019-- done at [place], Dr. Ninfa Linden   BACK SURGERY       CATARACT EXTRACTION W/ INTRAOCULAR LENS  IMPLANT, BILATERAL       CYSTOSCOPY       ENDARTERECTOMY  Right 08/24/2019    Procedure: ENDARTERECTOMY CAROTID;  Surgeon: Marty Heck, MD;  Location: [facility];  Service: Vascular;  Laterality: Right;   FACIAL COSMETIC SURGERY   2014   FINGER SURGERY   2002   LUMBAR LAMINECTOMY/DECOMPRESSION MICRODISCECTOMY Left 08/01/2018    Procedure: Microdiscectomy - Lumbar four-Lumbar five - left;  Surgeon: Eustace Moore, MD;  Location: [facility];  Service: Neurosurgery;  Laterality: Left;   LUMBAR LAMINECTOMY/DECOMPRESSION MICRODISCECTOMY Left 10/15/2018    Procedure: Re-do Microdiscectomy - left - Lumbar four-Lumbar five;  Surgeon: Eustace Moore, MD;  Location: [facility];  Service: Neurosurgery;  Laterality: Left;   NECK SURGERY   1986   PERIPHERAL VASCULAR ATHERECTOMY   12/02/2019    Procedure: PERIPHERAL VASCULAR ATHERECTOMY;  Surgeon: Marty Heck, MD;  Location: [facility];  Service: Cardiovascular;;  left SFA   PERIPHERAL VASCULAR INTERVENTION   06/18/2018    Procedure: PERIPHERAL VASCULAR INTERVENTION;  Surgeon: Marty Heck, MD;  Location: [facility];  Service: Cardiovascular;;  Left external iliac   ROTATOR CUFF REPAIR Left     UMBILICAL HERNIA REPAIR               Family History  Problem Relation Age of Onset   Diabetes Sister     Diabetes Mellitus I Sister     Stroke Father     Heart disease Father     CVA Father     Coronary artery disease Father     Diabetes Mother     Heart disease Mother     Diabetes Mellitus I Mother     Coronary artery disease Mother     Coronary artery disease Brother     Heart disease Brother     Coronary artery disease Brother        SOCIAL HISTORY: Social History         Tobacco Use   Smoking status: Former      Years: 40.00      Types: Cigarettes      Quit date: 12/2015      Years since quitting: 5.3   Smokeless tobacco: Never   Tobacco comments:      Quit 12-2012  Substance Use Topics   Alcohol use: No          Allergies  Allergen Reactions   Codeine Nausea Only             Current Outpatient Medications  Medication Sig Dispense Refill   amLODipine-benazepril (LOTREL) 10-40 MG per capsule Take 1 capsule by mouth daily.       aspirin EC 81 MG EC  tablet Take 1 tablet (81 mg total) by mouth daily at 6 (six) AM.       carvedilol (COREG) 6.25 MG tablet Take 1 tablet (6.25 mg total) by mouth 2 (two) times daily. 180 tablet 3   clopidogrel (PLAVIX) 75 MG tablet TAKE 1 TABLET BY MOUTH EVERY DAY 90 tablet 2   Continuous Blood Gluc Sensor (FREESTYLE LIBRE 2 SENSOR) MISC AS DIRECTED FOR BLOOD SUGAR CHECK DX E11.42       ferrous sulfate 325 (65 FE) MG tablet Take 325 mg by mouth daily with breakfast.       insulin degludec (TRESIBA FLEXTOUCH) 100 UNIT/ML FlexTouch Pen Inject 60 Units into the skin in the morning.       metFORMIN (GLUCOPHAGE-XR) 500 MG 24 hr tablet Take 1,000 mg by mouth 2 (two) times daily.       ONETOUCH VERIO test strip 1 each 3 (three) times daily.       pantoprazole (PROTONIX) 40 MG tablet Take 40 mg by mouth daily with breakfast.        pioglitazone (ACTOS) 45 MG tablet TAKE 1 TABLET DAILY (NEED APPOINTMENT FOR FURTHER REFILLS) (Patient taking differently: Take 45 mg by mouth daily.) 90 tablet 0   repaglinide (PRANDIN) 2 MG tablet Take 2 mg by mouth 3 (three) times daily before meals.        rosuvastatin (CRESTOR) 40 MG tablet Take 40 mg by mouth daily.         No current facility-administered medications for this visit.      REVIEW OF SYSTEMS:  [X]  denotes positive finding, [ ]  denotes negative finding Cardiac   Comments:  Chest pain or chest pressure:      Shortness of breath upon exertion:      Short of breath when lying flat:      Irregular heart rhythm:             Vascular      Pain in calf, thigh, or hip brought on by ambulation: x left  Pain in feet at night that wakes you up from your sleep:       Blood clot in your veins:      Leg swelling:              Pulmonary      Oxygen at home:      Productive cough:        Wheezing:              Neurologic      Sudden weakness in arms or legs:       Sudden numbness in arms or legs:       Sudden onset of difficulty speaking or slurred speech:      Temporary loss of vision in one eye:       Problems with dizziness:              Gastrointestinal      Blood in stool:       Vomited blood:              Genitourinary      Burning when urinating:       Blood in urine:             Psychiatric      Major depression:              Hematologic      Bleeding problems:      Problems with blood clotting too  easily:             Skin      Rashes or ulcers:             Constitutional      Fever or chills:          PHYSICAL EXAM:    Vitals:    04/25/21 1327  BP: (!) 157/60  Pulse: 73  Resp: 18  Temp: 97.9 F (36.6 C)  TempSrc: Temporal  SpO2: 100%  Weight: 171 lb 8 oz (77.8 kg)  Height: 5\' 9"  (1.753 m)      GENERAL: The patient is a well-nourished male, in no acute distress. The vital signs are documented above. CARDIAC: There is a regular rate and rhythm.  VASCULAR:  Palpable femoral pulses bilaterally No palpable pedal pulses No tissue loss PULMONARY: No respiratory distress ABDOMEN: Soft and non-tender. MUSCULOSKELETAL: There are no major deformities or cyanosis. NEUROLOGIC: No focal weakness or paresthesias are detected.     DATA:    Saphenous vein mapping today shows no long segment surface vein.   Angiogram again reviewed from 03/23/2021 and he appears to have a high-grade ostial stenosis at the SFA greater than 80% with a diseased proximal and mid SFA and then a distal SFA above-knee popliteal occlusion.   Assessment/Plan:   71 year old male well-known to vascular surgery that previously had a left external iliac stent as well as left SFA above-knee popliteal atherectomy with drug-coated balloon.  Recently underwent angiogram on 03/23/2021 for worsening lifestyle limiting claudication in the left lower extremity.  Given a  high-grade proximal SFA ostial stenosis with a diseased SFA and then a distal SFA above-knee popliteal occlusion, I thought his best option would likely be left fem pop bypass.  He presents today for vein mapping and to discuss bypass options.  I discussed he really does not have any long segment surface vein and ultimately may require prosthetic graft.  I will look again in the OR to see if I can find a segment of vein.  We discussed a prosthetic graft being of poorer durability and higher risk for infection.  Ultimately he wishes to proceed and feels that his claudication has become quite disabling.  We will get him scheduled for 05/08/2021.  Risk benefits discussed.  No cardiac history according to the patient     Marty Heck, MD [facility] [place] Office: [place]

## 2021-06-06 ENCOUNTER — Encounter (HOSPITAL_COMMUNITY): Payer: Self-pay | Admitting: Vascular Surgery

## 2021-06-06 LAB — BASIC METABOLIC PANEL
Anion gap: 7 (ref 5–15)
BUN: 17 mg/dL (ref 8–23)
CO2: 21 mmol/L — ABNORMAL LOW (ref 22–32)
Calcium: 8.1 mg/dL — ABNORMAL LOW (ref 8.9–10.3)
Chloride: 107 mmol/L (ref 98–111)
Creatinine, Ser: 0.9 mg/dL (ref 0.61–1.24)
GFR, Estimated: >60 mL/min (ref >60)
Glucose, Bld: 238 mg/dL — ABNORMAL HIGH (ref 70–99)
Potassium: 3.6 mmol/L (ref 3.5–5.1)
Sodium: 135 mmol/L (ref 135–145)

## 2021-06-06 LAB — CBC
HCT: 28.4 % — ABNORMAL LOW (ref 39.0–52.0)
Hemoglobin: 9.5 g/dL — ABNORMAL LOW (ref 13.0–17.0)
MCH: 29.4 pg (ref 26.0–34.0)
MCHC: 33.5 g/dL (ref 30.0–36.0)
MCV: 87.9 fL (ref 80.0–100.0)
Platelets: 219 10*3/uL (ref 150–400)
RBC: 3.23 MIL/uL — ABNORMAL LOW (ref 4.22–5.81)
RDW: 14.8 % (ref 11.5–15.5)
WBC: 9.2 10*3/uL (ref 4.0–10.5)
nRBC: 0 % (ref 0.0–0.2)

## 2021-06-06 LAB — GLUCOSE, CAPILLARY
Glucose-Capillary: 220 mg/dL — ABNORMAL HIGH (ref 70–99)
Glucose-Capillary: 192 mg/dL — ABNORMAL HIGH (ref 70–99)
Glucose-Capillary: 147 mg/dL — ABNORMAL HIGH (ref 70–99)
Glucose-Capillary: 100 mg/dL — ABNORMAL HIGH (ref 70–99)

## 2021-06-06 LAB — LIPID PANEL
Cholesterol: 94 mg/dL (ref 0–200)
HDL: 39 mg/dL — ABNORMAL LOW (ref >40)
LDL Cholesterol: 43 mg/dL (ref 0–99)
Total CHOL/HDL Ratio: 2.4 RATIO
Triglycerides: 59 mg/dL (ref <150)
VLDL: 12 mg/dL (ref 0–40)

## 2021-06-06 MED ORDER — EZETIMIBE 10 MG PO TABS
10.0000 mg | ORAL_TABLET | Freq: Every day | ORAL | Status: DC
  Administered 2021-06-06 – 2021-06-07 (×2): 10 mg via ORAL
  Filled 2021-06-06 (×2): qty 1

## 2021-06-06 MED ORDER — CLOPIDOGREL BISULFATE 75 MG PO TABS
75.0000 mg | ORAL_TABLET | Freq: Every day | ORAL | Status: DC
  Administered 2021-06-06 – 2021-06-07 (×2): 75 mg via ORAL
  Filled 2021-06-06 (×2): qty 1

## 2021-06-06 NOTE — Progress Notes (Signed)
PHARMACIST LIPID MONITORING   Luis Lindsey is a 71 y.o. male admitted on 06/05/2021 with fem-pop bypass.  Pharmacy has been consulted to optimize lipid-lowering therapy with the indication of secondary prevention for clinical ASCVD.  Recent Labs:  Lipid Panel (last 6 months):   Lab Results  Component Value Date   CHOL 94 06/06/2021   TRIG 59 06/06/2021   HDL 39 (L) 06/06/2021   CHOLHDL 2.4 06/06/2021   VLDL 12 06/06/2021   LDLCALC 43 06/06/2021    Hepatic function panel (last 6 months):   Lab Results  Component Value Date   AST 16 05/24/2021   ALT 18 05/24/2021   ALKPHOS 88 05/24/2021   BILITOT 0.5 05/24/2021    SCr (since admission):   Serum creatinine: 0.9 mg/dL 06/06/21 0329 Estimated creatinine clearance: 80 mL/min  Current therapy and lipid therapy tolerance Current lipid-lowering therapy: crestor 40mg  daily Previous lipid-lowering therapies (if applicable): n/a Documented or reported allergies or intolerances to lipid-lowering therapies (if applicable): n/a  Assessment:   Patient agrees with changes to lipid-lowering therapy  Plan:    1.Statin intensity (high intensity recommended for all patients regardless of the LDL):  No statin changes. The patient is already on a high intensity statin.  2.Add ezetimibe   3.Refer to lipid clinic:   No  4.Follow-up with:  Primary care provider - Alroy Dust, [name], MD  5.Follow-up labs after discharge:  Changes in lipid therapy were made. Check a lipid panel in 8-12 weeks then annually.       Einar Grad, PharmD 06/06/2021, 7:02 AM

## 2021-06-06 NOTE — Progress Notes (Addendum)
[facility] [place]  Subjective  - no new complaints.  He states he walked to the door and back yesterday.   Objective (!) 144/42 63 98.6 F (37 C) (Oral) 13 99%  Intake/Output Summary (Last 24 hours) at 06/06/2021 0739 Last data filed at 06/06/2021 0542 Gross per 24 hour  Intake 1943.75 ml  Output 2850 ml  Net -906.25 ml    Left groin soft without hematoma Left lower leg incision healing well, compartments soft, motor intact. Doppler signals brisk DP/PT B LE  Lungs non labored breathing  Assessment/Planning: POD # 1 Left fem-BK popliteal bypass with PTFE  Patent bypass with good doppler signals Plan to restart Plavix in addition to ASA and Statin today. Pending independent voiding and mobility to decide discharge date.  Foley removed this am.   Stable post op disposition    Roxy Horseman 06/06/2021 7:39 AM --  Laboratory Lab Results: Recent Labs    06/05/21 1223 06/06/21 0329  WBC 12.6* 9.2  HGB 10.5* 9.5*  HCT 31.9* 28.4*  PLT 232 219   BMET Recent Labs    06/05/21 0919 06/05/21 1223 06/06/21 0329  NA 141  --  135  K 3.4*  --  3.6  CL 105  --  107  CO2  --   --  21*  GLUCOSE 148*  --  238*  BUN 14  --  17  CREATININE 0.50* 0.71 0.90  CALCIUM  --   --  8.1*    COAG Lab Results  Component Value Date   INR 1.0 05/24/2021   INR 1.0 12/05/2020   INR 0.9 08/18/2019   No results found for: PTT  I have seen and evaluated the patient. I agree with the PA note as documented above.  Postop day 1 status post left common femoral endarterectomy with profundoplasty and bovine pericardial patch angioplasty and then left common femoral to below-knee popliteal bypass with PTFE for lifestyle limiting claudication.  Looks excellent today and has a palpable PT pulse at the ankle.  All of his incisions look good.  We will restart his Plavix today with plans for aspirin Plavix and statin at discharge.  He did walk some yesterday.   Work with therapy today.  If he continues to do good likely discharge tomorrow.  Postop labs look good.  Marty Heck, MD [facility] [place] Office: [phone]

## 2021-06-06 NOTE — Progress Notes (Signed)
Inpatient Diabetes Program Recommendations  AACE/ADA: New Consensus Statement on Inpatient Glycemic Control (2015)  Target Ranges:  Prepandial:   less than 140 mg/dL      Peak postprandial:   less than 180 mg/dL (1-2 hours)      Critically ill patients:  140 - 180 mg/dL   Lab Results  Component Value Date   GLUCAP 192 (H) 06/06/2021   HGBA1C 9.7 (H) 06/05/2021    Review of Glycemic Control  Latest Reference Range & Units 06/05/21 06:06 06/05/21 11:04 06/05/21 15:46 06/05/21 21:32 06/06/21 06:03 06/06/21 11:52  Glucose-Capillary 70 - 99 mg/dL 301 (H) 129 (H) 181 (H) 282 (H) 220 (H) 192 (H)   Diabetes history: DM 2 Outpatient Diabetes medications: Tresiba 60 units daily, Metformin 1000 mg bid, Actos 45 mg daily, Prandin 2 mg tid with meals Current orders for Inpatient glycemic control:  Novolog moderate tid with meals Semglee 60 units daily Metformin-XR 1000 mg bid Actos 45 mg daily Prandin 2 mg tid   Inpatient Diabetes Program Recommendations:    Spoke with patient regarding DM management. He states that blood sugars have been higher in the AM then throughout the day.  He has a [employer] 3 for glucose monitoring. Discussed goal A1C and importance of management.  Discussed that home meds were restarted today.  Encouraged f/u with PCP, Dr. Alroy Dust. Will follow.   Thanks,  Adah Perl, RN, BC-ADM Inpatient Diabetes Coordinator Pager [phone]  (8a-5p)

## 2021-06-06 NOTE — Progress Notes (Signed)
Mobility Specialist: Progress Note   06/06/21 1200  Mobility  Activity Ambulated in hall  Level of Assistance Standby assist, set-up cues, supervision of patient - no hands on  Assistive Device Front wheel walker  Distance Ambulated (ft) 200 ft  Mobility Ambulated with assistance in hallway  Mobility Response Tolerated well  Mobility performed by Mobility specialist  $Mobility charge 1 Mobility   Pre-Mobility: 52 HR Post-Mobility: 77 HR, 159/54 BP  Pt independent with bed mobility and mod I to stand. Pt c/o 7/10 pain during ambulation and is requesting pain medication, RN notified. Pt sitting EOB after walk to eat lunch with call bell at his side and family present in the room.  [facility] Anayla Giannetti Mobility Specialist Mobility Specialist 4 [place]: [phone] Mobility Specialist 2 [place]: [phone]

## 2021-06-06 NOTE — Evaluation (Signed)
Physical Therapy Evaluation Patient Details Name: Luis Lindsey MRN: [id number] DOB: 31-Jan-1951 Today's Date: 06/06/2021  History of Present Illness  71 yo male s/p L fem- BK popliteal by pass with PTFE 06/05/21 PMH HTN HLD DM BCC aortic stenosis, kidney stones, back surgery 2009 2020 PVD L rotator cuff repair  Clinical Impression  Pt is close to baseline functioning and should be safe at home. There are no further acute PT needs.  Will sign off at this time.        Recommendations for follow up therapy are one component of a multi-disciplinary discharge planning process, led by the attending physician.  Recommendations may be updated based on patient status, additional functional criteria and insurance authorization.  Follow Up Recommendations No PT follow up    Assistance Recommended at Discharge PRN  Patient can return home with the following       Equipment Recommendations None recommended by PT  Recommendations for Other Services       Functional Status Assessment Patient has had a recent decline in their functional status and demonstrates the ability to make significant improvements in function in a reasonable and predictable amount of time.     Precautions / Restrictions Precautions Precautions: Fall      Mobility  Bed Mobility Overal bed mobility: Independent                  Transfers Overall transfer level: Needs assistance   Transfers: Sit to/from Stand Sit to Stand: Supervision           General transfer comment: able to complete transfer and power without assist using UE's appropriately.    Ambulation/Gait Ambulation/Gait assistance: Supervision Gait Distance (Feet): 250 Feet Assistive device: Rolling walker (2 wheels) Gait Pattern/deviations: Step-to pattern;Step-through pattern   Gait velocity interpretation: 1.31 - 2.62 ft/sec, indicative of limited community ambulator   General Gait Details: progressing toward step through pattern,  steady and mildly antalgic.  Stairs Stairs: Yes Stairs assistance: Supervision Stair Management: One rail Left;Two rails;Step to pattern;Forwards Number of Stairs: 5 General stair comments: safe with rail, cued for appropriate step to pattern  Wheelchair Mobility    Modified Rankin (Stroke Patients Only)       Balance Overall balance assessment: Needs assistance Sitting-balance support: No upper extremity supported;Feet supported Sitting balance-Leahy Scale: Good     Standing balance support: Bilateral upper extremity supported;Reliant on assistive device for balance;No upper extremity supported Standing balance-Leahy Scale: Fair Standing balance comment: stood easily while donning mask to get out in the hall                             Pertinent Vitals/Pain Pain Assessment: Faces Faces Pain Scale: Hurts little more Pain Location: L LE Pain Descriptors / Indicators: Discomfort;Sore Pain Intervention(s): Monitored during session    Home Living Family/patient expects to be discharged to:: Private residence Living Arrangements: Spouse/significant other Available Help at Discharge: Family Type of Home: House Home Access: Stairs to enter Entrance Stairs-Rails: Left Entrance Stairs-Number of Steps: 2   Home Layout: One level Home Equipment: Cane - single point;Crutches;Grab bars - toilet;Grab bars - tub/shower;Hand held shower head;Adaptive equipment;Wheelchair - manual;Shower seat      Prior Function Prior Level of Function : Independent/Modified Independent                     Hand Dominance   Dominant Hand: Right    Extremity/Trunk Assessment  Upper Extremity Assessment Upper Extremity Assessment: Overall WFL for tasks assessed    Lower Extremity Assessment Lower Extremity Assessment: Overall WFL for tasks assessed    Cervical / Trunk Assessment Cervical / Trunk Assessment: Back Surgery  Communication   Communication: No  difficulties  Cognition Arousal/Alertness: Awake/alert Behavior During Therapy: WFL for tasks assessed/performed Overall Cognitive Status: Impaired/Different from baseline Area of Impairment: Safety/judgement                         Safety/Judgement: Decreased awareness of deficits     General Comments: pt needs cues during session for safety with transfer. pt lacks awareness to balance deficits at this time        General Comments      Exercises     Assessment/Plan    PT Assessment Patient needs continued PT services  PT Problem List Decreased activity tolerance;Decreased mobility;Pain       PT Treatment Interventions Gait training;Functional mobility training;Therapeutic activities;Patient/family education    PT Goals (Current goals can be found in the Care Plan section)  Acute Rehab PT Goals Patient Stated Goal: home tomorrow and independent soon PT Goal Formulation: With patient Time For Goal Achievement: 06/08/21 Potential to Achieve Goals: Good    Frequency Min 3X/week     Co-evaluation               AM-PAC PT "6 Clicks" Mobility  Outcome Measure Help needed turning from your back to your side while in a flat bed without using bedrails?: None Help needed moving from lying on your back to sitting on the side of a flat bed without using bedrails?: None Help needed moving to and from a bed to a chair (including a wheelchair)?: A Little Help needed standing up from a chair using your arms (e.g., wheelchair or bedside chair)?: A Little Help needed to walk in hospital room?: A Little Help needed climbing 3-5 steps with a railing? : A Little 6 Click Score: 20    End of Session   Activity Tolerance: Patient tolerated treatment well Patient left: in bed;with call bell/phone within reach Nurse Communication: Mobility status PT Visit Diagnosis: Other abnormalities of gait and mobility (R26.89);Pain Pain - Right/Left: Left Pain - part of body:  Leg    Time: 2595-6387 PT Time Calculation (min) (ACUTE ONLY): 15 min   Charges:   PT Evaluation $PT Eval Low Complexity: 1 Low          06/06/2021  Ginger Carne., PT Acute Rehabilitation Services [phone]  (pager) [phone]  (office)  Tessie Fass Diago Haik 06/06/2021, 3:33 PM

## 2021-06-06 NOTE — Evaluation (Signed)
Occupational Therapy Evaluation Patient Details Name: Luis Lindsey MRN: [id number] DOB: Jan 18, 1951 Today's Date: 06/06/2021   History of Present Illness 71 yo male s/p L fem- BK popliteal by pass with PTFE 06/05/21 PMH HTN HLD DM BCC aortic stenosis, kidney stones, back surgery 2009 2020 PVD L rotator cuff repair   Clinical Impression   Patient is s/p L fem-BK popliteal by pass surgery resulting in functional limitations due to the deficits listed below (see OT problem list). Pt with balance deficits affecting all adls at this time. Pt is able to complete adl task in a seated position with decreased fall risk. Pt is progressing well.  Patient will benefit from skilled OT acutely to increase independence and safety with ADLS to allow discharge [place].       Recommendations for follow up therapy are one component of a multi-disciplinary discharge planning process, led by the attending physician.  Recommendations may be updated based on patient status, additional functional criteria and insurance authorization.   Follow Up Recommendations  Home health OT    Assistance Recommended at Discharge    Patient can return home with the following A little help with walking and/or transfers;A little help with bathing/dressing/bathroom;Assist for transportation    Functional Status Assessment  Patient has had a recent decline in their functional status and demonstrates the ability to make significant improvements in function in a reasonable and predictable amount of time.  Equipment Recommendations  Other (comment) (RW- has one form back surgery)    Recommendations for Other Services       Precautions / Restrictions Precautions Precautions: Fall      Mobility Bed Mobility Overal bed mobility: Independent                  Transfers Overall transfer level: Needs assistance   Transfers: Sit to/from Stand Sit to Stand: Min guard           General transfer comment: able to complete  transfer and power up but with LOB upon standing      Balance Overall balance assessment: Needs assistance Sitting-balance support: No upper extremity supported;Feet supported Sitting balance-Leahy Scale: Good     Standing balance support: Bilateral upper extremity supported;Reliant on assistive device for balance Standing balance-Leahy Scale: Poor                             ADL either performed or assessed with clinical judgement   ADL Overall ADL's : Needs assistance/impaired Eating/Feeding: Independent   Grooming: Wash/dry hands;Wash/dry face;Oral care;Minimal assistance;Standing Grooming Details (indicate cue type and reason): pt with LOB to the L x2 with grooming task. pt needs min (A) for balance. pt able to sequence task without needs Upper Body Bathing: Set up;Sitting   Lower Body Bathing: Minimal assistance;Sit to/from stand Lower Body Bathing Details (indicate cue type and reason): able to figure 4 cross and needs (A) for balance Upper Body Dressing : Set up;Sitting   Lower Body Dressing: Minimal assistance;Sit to/from stand   Toilet Transfer: Minimal assistance   Toileting- Clothing Manipulation and Hygiene: Minimal assistance       Functional mobility during ADLs: Minimal assistance (requires hand held (A)) General ADL Comments: pt with balance deficit affecting all adls. pt is able to figure 4 cross. pt doff socks and don home shoes during session.     Vision Baseline Vision/History: 1 Wears glasses       Perception     Praxis  Pertinent Vitals/Pain Pain Assessment: 0-10 Pain Score: 2  Pain Location: L LE Pain Descriptors / Indicators: Discomfort;Sore Pain Intervention(s): Monitored during session;Repositioned     Hand Dominance Right   Extremity/Trunk Assessment Upper Extremity Assessment Upper Extremity Assessment: Overall WFL for tasks assessed   Lower Extremity Assessment Lower Extremity Assessment: Defer to PT  evaluation   Cervical / Trunk Assessment Cervical / Trunk Assessment: Back Surgery   Communication Communication Communication: No difficulties   Cognition Arousal/Alertness: Awake/alert Behavior During Therapy: WFL for tasks assessed/performed Overall Cognitive Status: Impaired/Different from baseline Area of Impairment: Safety/judgement                         Safety/Judgement: Decreased awareness of deficits     General Comments: pt needs cues during session for safety with transfer. pt lacks awareness to balance deficits at this time     General Comments  VSS, BP monitored and stable. All incision sites dry and clean. Check groinin site and clean/ dry/ looks appropriate POD #1    Exercises     Shoulder Instructions      Home Living Family/patient expects to be discharged to:: Private residence Living Arrangements: Spouse/significant other Available Help at Discharge: Family Type of Home: House Home Access: Stairs to enter Technical brewer of Steps: 2 Entrance Stairs-Rails: Left Home Layout: One level     Bathroom Shower/Tub: Corporate investment banker: Standard Bathroom Accessibility: Yes   Home Equipment: Cane - single point;Crutches;Grab bars - toilet;Grab bars - tub/shower;Hand held shower head;Adaptive equipment;Wheelchair - Designer, fashion/clothing: Reacher        Prior Functioning/Environment Prior Level of Function : Independent/Modified Independent                        OT Problem List: Impaired balance (sitting and/or standing)      OT Treatment/Interventions: Self-care/ADL training;Therapeutic exercise;Energy conservation;DME and/or AE instruction;Therapeutic activities;Balance training;Patient/family education    OT Goals(Current goals can be found in the care plan section) Acute Rehab OT Goals Patient Stated Goal: none stated at this time OT Goal Formulation: With patient Time For Goal  Achievement: 06/20/21 Potential to Achieve Goals: Good  OT Frequency: Min 2X/week    Co-evaluation              AM-PAC OT "6 Clicks" Daily Activity     Outcome Measure Help from another person eating meals?: None Help from another person taking care of personal grooming?: None Help from another person toileting, which includes using toliet, bedpan, or urinal?: None Help from another person bathing (including washing, rinsing, drying)?: A Little Help from another person to put on and taking off regular upper body clothing?: None Help from another person to put on and taking off regular lower body clothing?: A Little 6 Click Score: 22   End of Session Nurse Communication: Mobility status;Precautions  Activity Tolerance: Patient tolerated treatment well Patient left: in bed;with call bell/phone within reach  OT Visit Diagnosis: Unsteadiness on feet (R26.81)                Time: 5027-7412 OT Time Calculation (min): 18 min Charges:  OT General Charges $OT Visit: 1 Visit OT Evaluation $OT Eval Moderate Complexity: 1 Mod   Luis Lindsey, OTR/L  Acute Rehabilitation Services Pager: [phone] Office: [phone] .   Luis Lindsey 06/06/2021, 10:16 AM

## 2021-06-07 LAB — GLUCOSE, CAPILLARY: Glucose-Capillary: 71 mg/dL (ref 70–99)

## 2021-06-07 MED ORDER — OXYCODONE-ACETAMINOPHEN 5-325 MG PO TABS: 1.0000 | ORAL_TABLET | Freq: Four times a day (QID) | ORAL | 0 refills | Status: DC | PRN

## 2021-06-07 MED ORDER — EZETIMIBE 10 MG PO TABS: 10.0000 mg | ORAL_TABLET | Freq: Every day | ORAL | 3 refills | Status: DC

## 2021-06-07 NOTE — Discharge Instructions (Signed)
 [facility]  Discharge instructions  Lower Extremity Bypass Surgery  Please refer to the following instruction for your post-procedure care. Your surgeon or physician assistant will discuss any changes with you.  Activity  You are encouraged to walk as much as you can. You can slowly return to normal activities during the month after your surgery. Avoid strenuous activity and heavy lifting until your doctor tells you it's OK. Avoid activities such as vacuuming or swinging a golf club. Do not drive until your doctor give the OK and you are no longer taking prescription pain medications. It is also normal to have difficulty with sleep habits, eating and bowel movement after surgery. These will go away with time.  Bathing/Showering  You may shower after you go home. Do not soak in a bathtub, hot tub, or swim until the incision heals completely.  Incision Care  Clean your incision with mild soap and water. Shower every day. Pat the area dry with a clean towel. You do not need a bandage unless otherwise instructed. Do not apply any ointments or creams to your incision. If you have open wounds you will be instructed how to care for them or a visiting nurse may be arranged for you. If you have staples or sutures along your incision they will be removed at your post-op appointment. You may have skin glue on your incision. Do not peel it off. It will come off on its own in about one week. If you have a great deal of moisture in your groin, use a gauze help keep this area dry.  Diet  Resume your normal diet. There are no special food restrictions following this procedure. A low fat/ low cholesterol diet is recommended for all patients with vascular disease. In order to heal from your surgery, it is CRITICAL to get adequate nutrition. Your body requires vitamins, minerals, and protein. Vegetables are the best source of vitamins and minerals. Vegetables also provide the  perfect balance of protein. Processed food has little nutritional value, so try to avoid this.  Medications  Resume taking all your medications unless your doctor or nurse practitioner tells you not to. If your incision is causing pain, you may take over-the-counter pain relievers such as acetaminophen (Tylenol). If you were prescribed a stronger pain medication, please aware these medication can cause nausea and constipation. Prevent nausea by taking the medication with a snack or meal. Avoid constipation by drinking plenty of fluids and eating foods with high amount of fiber, such as fruits, vegetables, and grains. Take Colase 100 mg (an over-the-counter stool softener) twice a day as needed for constipation. Do not take Tylenol if you are taking prescription pain medications.  Follow Up  Our office will schedule a follow up appointment 2-3 weeks following discharge.  Please call us immediately for any of the following conditions  Severe or worsening pain in your legs or feet while at rest or while walking Increase pain, redness, warmth, or drainage (pus) from your incision site(s) Fever of 101 degree or higher The swelling in your leg with the bypass suddenly worsens and becomes more painful than when you were in the hospital If you have been instructed to feel your graft pulse then you should do so every day. If you can no longer feel this pulse, call the office immediately. Not all patients are given this instruction.  Leg swelling is common after leg bypass surgery.  The swelling should improve over a few months   following surgery. To improve the swelling, you may elevate your legs above the level of your heart while you are sitting or resting. Your surgeon or physician assistant may ask you to apply an ACE wrap or wear compression (TED) stockings to help to reduce swelling.  Reduce your risk of vascular disease  Stop smoking. If you would like help call [facility] at 1-800-QUIT-NOW  (1-800-784-8669) or [place] at 336-586-4000.  Manage your cholesterol Maintain a desired weight Control your diabetes weight Control your diabetes Keep your blood pressure down  If you have any questions, please call the office at [phone]   

## 2021-06-07 NOTE — Progress Notes (Addendum)
°  Progress Note    06/07/2021 7:50 AM 2 Days Post-Op  Subjective:  Pain in L heel overnight however this has resolved   Vitals:   06/06/21 2311 06/07/21 0324  BP: (!) 121/44 (!) 124/47  Pulse: 66 65  Resp: 15 18  Temp: 98.9 F (37.2 C) 98.5 F (36.9 C)  SpO2: 99% 99%   Physical Exam: Lungs:  non labored Incisions:  L groin and popliteal incision c/d/i Extremities:  brisk PTA and DP signal by doppler Neurologic: A&O  CBC    Component Value Date/Time   WBC 9.2 06/06/2021 0329   RBC 3.23 (L) 06/06/2021 0329   HGB 9.5 (L) 06/06/2021 0329   HCT 28.4 (L) 06/06/2021 0329   PLT 219 06/06/2021 0329   MCV 87.9 06/06/2021 0329   MCH 29.4 06/06/2021 0329   MCHC 33.5 06/06/2021 0329   RDW 14.8 06/06/2021 0329   LYMPHSABS 1.5 12/05/2020 1000   MONOABS 0.7 12/05/2020 1000   EOSABS 0.1 12/05/2020 1000   BASOSABS 0.0 12/05/2020 1000    BMET    Component Value Date/Time   NA 135 06/06/2021 0329   K 3.6 06/06/2021 0329   CL 107 06/06/2021 0329   CO2 21 (L) 06/06/2021 0329   GLUCOSE 238 (H) 06/06/2021 0329   BUN 17 06/06/2021 0329   CREATININE 0.90 06/06/2021 0329   CALCIUM 8.1 (L) 06/06/2021 0329   GFRNONAA >60 06/06/2021 0329   GFRAA 45 (L) 12/09/2019 2030    INR    Component Value Date/Time   INR 1.0 05/24/2021 1141     Intake/Output Summary (Last 24 hours) at 06/07/2021 0750 Last data filed at 06/07/2021 0230 Gross per 24 hour  Intake 450 ml  Output 550 ml  Net -100 ml     Assessment/Plan:  71 y.o. male is s/p L CFA endarterectomy and femoral to popliteal bypass with PTFE 2 Days Post-Op   L foot well perfused with DP and PT signals by doppler Passed PT eval yesterday; no HH recommendations Ok for discharge home this morning Office will arrange follow up in 2-3 weeks   Dagoberto Ligas, PA-C [facility] [phone] 06/07/2021 7:50 AM  I have seen and evaluated the patient. I agree with the PA note as documented above.  Postop day  2 status post left common femoral endarterectomy with bovine patch and a left common femoral to below-knee popliteal bypass with PTFE for lifestyle limiting claudication.  Palpable PT pulse at the ankle.  All of incisions look good.  Cleared by therapy.  Plan for discharge today.  Aspirin Plavix statin.  We will arrange follow-up in 2 to 3 weeks  Marty Heck, MD [facility]: [phone]

## 2021-06-07 NOTE — Plan of Care (Signed)
°  Problem: Acute Rehab OT Goals (only OT should resolve) Goal: Pt. Will Perform Lower Body Bathing Outcome: Adequate for Discharge Goal: Pt. Will Transfer To Toilet Outcome: Adequate for Discharge Goal: Pt. Will Perform Tub/Shower Transfer Outcome: Adequate for Discharge Goal: OT Additional ADL Goal #1 Outcome: Adequate for Discharge

## 2021-06-07 NOTE — Progress Notes (Signed)
Discharge instructions (including medications) discussed with and copy provided to patient/caregiver 

## 2021-06-09 ENCOUNTER — Ambulatory Visit: Payer: HMO | Admitting: Cardiology

## 2021-06-12 NOTE — Discharge Summary (Signed)
Discharge Summary  Patient ID: Luis Lindsey [id number] y.o. 01-22-1951  Admit date: 06/05/2021  Discharge date and time: 06/07/2021 11:19 AM   Admitting Physician: Marty Heck, MD   Discharge Physician: same  Admission Diagnoses: PAD (peripheral artery disease) ([place]) [I73.9]  Discharge Diagnoses: same  Admission Condition: fair  Discharged Condition: fair  Indication for Admission: Left lower extremity bypass for lifestyle limiting claudication  Hospital Course: [name] is a 71 year old male who was brought in as an outpatient and underwent Left common femoral endarterectomy with bovine patch angioplasty and left common femoral to below the knee popliteal artery bypass with PTFE by Dr. Carlis Abbott on 06/05/2021.  This was performed due to lifestyle limiting claudication of the left leg.  He tolerated the procedure well and was admitted to the hospital postoperatively.  His hospital course was unremarkable.  Throughout his hospital stay he maintained a palpable PT pulse at the ankle.  Plavix was restarted on postoperative day #1.  This will be taken with aspirin and statin daily.  Patient will follow-up in the office in 2 to 3 weeks.  Discharge instructions were reviewed with the patient.  He was discharged home in stable condition.  Consults: None  Treatments: surgery: Left common femoral endarterectomy with bovine patch angioplasty and left common femoral to below the knee popliteal artery bypass with PTFE by Dr. Carlis Abbott on 06/05/2021  Discharge Exam: See progress note 06/07/21 Vitals:   06/07/21 0810 06/07/21 1049  BP: (!) 125/47 (!) 143/52  Pulse: 64 69  Resp: 13 18  Temp: 98.4 F (36.9 C) 97.6 F (36.4 C)  SpO2: 100% 97%     Disposition: Discharge disposition: 01-Home or Self Care       Patient Instructions:  Allergies as of 06/07/2021       Reactions   Codeine Nausea Only        Medication List     TAKE these medications     amLODipine-benazepril 10-40 MG capsule Commonly known as: LOTREL Take 1 capsule by mouth daily.   aspirin 81 MG EC tablet Take 1 tablet (81 mg total) by mouth daily at 6 (six) AM.   carvedilol 6.25 MG tablet Commonly known as: COREG Take 1 tablet (6.25 mg total) by mouth 2 (two) times daily.   clopidogrel 75 MG tablet Commonly known as: PLAVIX TAKE 1 TABLET BY MOUTH EVERY DAY   ezetimibe 10 MG tablet Commonly known as: ZETIA Take 1 tablet (10 mg total) by mouth daily.   ferrous sulfate 325 (65 FE) MG tablet Take 325 mg by mouth daily with breakfast.   FreeStyle Libre 2 Sensor Misc AS DIRECTED FOR BLOOD SUGAR CHECK DX E11.42   metFORMIN 500 MG 24 hr tablet Commonly known as: GLUCOPHAGE-XR Take 1,000 mg by mouth 2 (two) times daily.   OneTouch Verio test strip Generic drug: glucose blood 1 each 3 (three) times daily.   oxyCODONE-acetaminophen 5-325 MG tablet Commonly known as: PERCOCET/ROXICET Take 1 tablet by mouth every 6 (six) hours as needed for moderate pain.   pantoprazole 40 MG tablet Commonly known as: PROTONIX Take 40 mg by mouth daily with breakfast.   pioglitazone 45 MG tablet Commonly known as: ACTOS TAKE 1 TABLET DAILY (NEED APPOINTMENT FOR FURTHER REFILLS) What changed: See the new instructions.   repaglinide 2 MG tablet Commonly known as: PRANDIN Take 2 mg by mouth 3 (three) times daily before meals.   rosuvastatin 40 MG tablet Commonly known as: CRESTOR Take 40 mg by  mouth daily.   Tyler Aas FlexTouch 100 UNIT/ML FlexTouch Pen Generic drug: insulin degludec Inject 60 Units into the skin in the morning.       Activity: activity as tolerated Diet: regular diet Wound Care: keep wound clean and dry  Follow-up with [facility] in 3 weeks.  Signed: Dagoberto Ligas, PA-C 06/12/2021 8:58 AM [facility] Office: [phone]

## 2021-06-27 ENCOUNTER — Encounter: Payer: Self-pay | Admitting: Physician Assistant

## 2021-06-27 ENCOUNTER — Ambulatory Visit (INDEPENDENT_AMBULATORY_CARE_PROVIDER_SITE_OTHER): Payer: HMO | Admitting: Physician Assistant

## 2021-06-27 ENCOUNTER — Other Ambulatory Visit: Payer: Self-pay

## 2021-06-27 VITALS — BP 158/60 | HR 70 | Temp 96.8°F | Ht 70.0 in | Wt 174.0 lb

## 2021-06-27 DIAGNOSIS — I739 Peripheral vascular disease, unspecified: Secondary | ICD-10-CM

## 2021-06-27 NOTE — Progress Notes (Signed)
POST OPERATIVE OFFICE NOTE    CC:  F/u for surgery  HPI:  This is a 71 y.o. male who is s/p left common femoral endarterectomy with bovine patch angioplasty and left common femoral to below the knee popliteal bypass with PTFE due to lifestyle limiting claudication by Dr. Carlis Abbott on 06/05/2021.  Patient states claudication symptoms of left calf have completely resolved.  He believes his groin and popliteal incisions are healed.  He complains of pitting edema in his lower leg.  He denies any fevers, chills, nausea/vomiting.  He is on aspirin, Plavix, statin daily.  He denies tobacco use.  Allergies  Allergen Reactions   Codeine Nausea Only    Current Outpatient Medications  Medication Sig Dispense Refill   amLODipine-benazepril (LOTREL) 10-40 MG per capsule Take 1 capsule by mouth daily.     aspirin EC 81 MG EC tablet Take 1 tablet (81 mg total) by mouth daily at 6 (six) AM.     carvedilol (COREG) 6.25 MG tablet Take 1 tablet (6.25 mg total) by mouth 2 (two) times daily. 180 tablet 3   clopidogrel (PLAVIX) 75 MG tablet TAKE 1 TABLET BY MOUTH EVERY DAY 90 tablet 2   Continuous Blood Gluc Sensor (FREESTYLE LIBRE 2 SENSOR) MISC AS DIRECTED FOR BLOOD SUGAR CHECK DX E11.42     ezetimibe (ZETIA) 10 MG tablet Take 1 tablet (10 mg total) by mouth daily. 90 tablet 3   ferrous sulfate 325 (65 FE) MG tablet Take 325 mg by mouth daily with breakfast.     insulin degludec (TRESIBA FLEXTOUCH) 100 UNIT/ML FlexTouch Pen Inject 60 Units into the skin in the morning.     metFORMIN (GLUCOPHAGE-XR) 500 MG 24 hr tablet Take 1,000 mg by mouth 2 (two) times daily.     ONETOUCH VERIO test strip 1 each 3 (three) times daily.     oxyCODONE-acetaminophen (PERCOCET/ROXICET) 5-325 MG tablet Take 1 tablet by mouth every 6 (six) hours as needed for moderate pain. 20 tablet 0   pantoprazole (PROTONIX) 40 MG tablet Take 40 mg by mouth daily with breakfast.      pioglitazone (ACTOS) 45 MG tablet TAKE 1 TABLET DAILY (NEED  APPOINTMENT FOR FURTHER REFILLS) (Patient taking differently: Take 45 mg by mouth daily.) 90 tablet 0   repaglinide (PRANDIN) 2 MG tablet Take 2 mg by mouth 3 (three) times daily before meals.      rosuvastatin (CRESTOR) 40 MG tablet Take 40 mg by mouth daily.      No current facility-administered medications for this visit.     ROS:  See HPI  Physical Exam:  Vitals:   06/27/21 1427  BP: (!) 158/60  Pulse: 70  Temp: (!) 96.8 F (36 C)  Weight: 174 lb (78.9 kg)  Height: 5\' 10"  (1.778 m)    Incision:  L groin and popliteal incision healing well Extremities:  palpable L PTA pulse; pitting edema to the level of the proximal shin  Assessment/Plan:  This is a 71 y.o. male who is s/p: Left common femoral artery endarterectomy with bovine patch angioplasty and left common femoral to below the knee popliteal bypass with PTFE due to lifestyle limiting claudication  -Left lower extremity well-perfused with palpable PTA pulse -Groin and popliteal incisions are well-healed; small fluid collection in groin however no erythema or drainage -Pitting edema of the left lower leg; recommended light knee-high compression and elevation of the legs when sitting -Patient also evaluated today by Dr. Carlis Abbott.  Plan will be to follow-up in  6 months with a bypass duplex and ABIs.  Patient will call/return office sooner with any questions or concerns.  He will continue aspirin, Plavix, statin daily.   Dagoberto Ligas, PA-C [facility] [phone]  Clinic MD:  Carlis Abbott

## 2021-06-29 ENCOUNTER — Other Ambulatory Visit: Payer: Self-pay | Admitting: *Deleted

## 2021-06-29 DIAGNOSIS — I739 Peripheral vascular disease, unspecified: Secondary | ICD-10-CM

## 2021-08-02 DIAGNOSIS — Z0000 Encounter for general adult medical examination without abnormal findings: Secondary | ICD-10-CM | POA: Diagnosis not present

## 2021-08-02 DIAGNOSIS — K219 Gastro-esophageal reflux disease without esophagitis: Secondary | ICD-10-CM | POA: Diagnosis not present

## 2021-08-02 DIAGNOSIS — E782 Mixed hyperlipidemia: Secondary | ICD-10-CM | POA: Diagnosis not present

## 2021-08-02 DIAGNOSIS — E1142 Type 2 diabetes mellitus with diabetic polyneuropathy: Secondary | ICD-10-CM | POA: Diagnosis not present

## 2021-08-02 DIAGNOSIS — Z125 Encounter for screening for malignant neoplasm of prostate: Secondary | ICD-10-CM | POA: Diagnosis not present

## 2021-08-02 DIAGNOSIS — I350 Nonrheumatic aortic (valve) stenosis: Secondary | ICD-10-CM | POA: Diagnosis not present

## 2021-08-02 DIAGNOSIS — I10 Essential (primary) hypertension: Secondary | ICD-10-CM | POA: Diagnosis not present

## 2021-08-20 NOTE — Progress Notes (Signed)
?Cardiology Office Note:   ? ?Date:  08/28/2021  ? ?ID:  Luis Lindsey, DOB [birth year], MRN [id number] ? ?PCP:  Alroy Dust, [name], MD  ?[facility] Cardiologist:  None  ?[place] Electrophysiologist:  None  ? ?Referring MD: Alroy Dust, [name], MD  ? ? ?History of Present Illness:   ? ?Luis Lindsey is a 70 y.o. male with a hx of PAD s/p L SFA atherectomy with angioplasty, carotid artery disease s/p L CEA, HTN, HLD, moderate AS, and tobacco use who returns to clinic for follow-up. ? ?Patient was initially seen in 03/2020 after having episodes of lightheadedness in the setting of anemia, moderate AS, and relative hypotension. We stopped his HCTZ and decreased his metop to '25mg'$  XL daily. Symptoms resolved. TTE 08/2019:Normal EF. Moderate aortic valve stenosis. Aortic valve area, by VTI measures 1.21 cm?[name] Aortic valve mean gradient measures 14.5 mmHg. Aortic valve Vmax measures 2.76 m/s.  ? ?Had abdominal aortogram in 02/2021 with Dr. Carlis Abbott where he was found to have ostial high grade SFA stenosis. Was recommended for bypass at that time. He subsequently underwent  left common femoral endarterectomy with profundoplasty and endarterectomy of the proximal SFA with bovine pericardial patch angioplasty and left common femoral to below-knee popliteal artery bypass on 06/05/21 without issues. Was discharged on ASA and plavix.  ? ?Was last seen in clinic on 06/29/20 where he was doing well. Had stable exertional dyspnea but no chest pain. Repeat TTE 06/2020 with EF 60-65%, G1DD AVA 1.33, mean gradient 19mHg, Vmax 2.84.  ? ?Today, the patient overall feels okay. Notably, about 3 weeks ago, the patient developed chest pain radiating to his left arm and associated SOB when making his bed. Symptoms resolved about 120mutes with sitting and resting. No symptoms while walking, however, walking is limited to about 1 block due to LE fatigue/weakness. Has been noting dull minor chest pain occasionally over the past several weeks  but mainly it is his legs and the feeling of weakness that limits his exertion.  ? ?He reports that his lower extremities swell by mid-day since his bypass. Has been wearing compression socks but this has not helped much. No orthopnea, PND, lightheadedness, dizziness, syncope. Compliant with his medications. Blood pressure mainly 130s at home. ? ?Past Medical History:  ?Diagnosis Date  ? Anemia   ? Aortic stenosis   ? Arthritis   ? BCC (basal cell carcinoma)   ? BPH (benign prostatic hyperplasia)   ? CAROTID BRUIT, RIGHT 11/30/2009  ? Dizziness   ? DM 11/30/2009  ? Dysphagia   ? Esophageal dysmotility   ? GERD (gastroesophageal reflux disease)   ? Heart murmur   ? History of hiatal hernia   ? History of Holter monitoring 2009  ? History of kidney stones   ? HNP (herniated nucleus pulposus), lumbar   ? L4-5  ? HYPERCHOLESTEROLEMIA 11/30/2009  ? Hyperlipidemia   ? HYPERTENSION 11/30/2009  ? Kidney stones   ? Numbness and tingling   ? [place]  ? Right carotid bruit   ? SMOKER 11/30/2009  ? [facility]  ? URINARY CALCULUS 11/30/2009  ? ? ?Past Surgical History:  ?Procedure Laterality Date  ? ABDOMINAL AORTOGRAM W/LOWER EXTREMITY N/A 06/18/2018  ? Procedure: ABDOMINAL AORTOGRAM W/LOWER EXTREMITY;  Surgeon: ClMarty HeckMD;  [facility];  Service: Cardiovascular;  Laterality: N/A;  ? ABDOMINAL AORTOGRAM W/LOWER EXTREMITY N/A 12/02/2019  ? Procedure: ABDOMINAL AORTOGRAM W/LOWER EXTREMITY;  Surgeon: ClMarty HeckMD;  Location: [place] CV LAB;  Service: Cardiovascular;  Laterality: N/A;  ? ABDOMINAL AORTOGRAM W/LOWER EXTREMITY N/A 03/23/2021  ? Procedure: ABDOMINAL AORTOGRAM W/LOWER EXTREMITY;  Surgeon: Marty Heck, MD;  Location: [place] CV LAB;  Service: Cardiovascular;  Laterality: N/A;  ? arthroscopy of right shoulder Right   ? Jan 2019, June 2019-- done at [place], Dr. Ninfa Linden  ? BACK SURGERY    ? CATARACT EXTRACTION  W/ INTRAOCULAR LENS  IMPLANT, BILATERAL    ? CYSTOSCOPY    ? ENDARTERECTOMY Right 08/24/2019  ? Procedure: ENDARTERECTOMY CAROTID;  Surgeon: Marty Heck, MD;  Location: [place];  Service: Vascular;  Laterality: Right;  ? ENDARTERECTOMY FEMORAL Left 06/05/2021  ? Procedure: ENDARTERECTOMY FEMORAL WITH PROFUNDAPLASTY;  Surgeon: Marty Heck, MD;  Location: [place];  Service: Vascular;  Laterality: Left;  ? FACIAL COSMETIC SURGERY  2014  ? FEMORAL-POPLITEAL BYPASS GRAFT Left 06/05/2021  ? Procedure: LEFT FEMORAL-POPLITEAL ARTERY BYPASS GRAFTING USING 55m PROPATEN VASCULAR REMOVABLE RING GRAFT;  Surgeon: CMarty Heck MD;  [place]  Service: Vascular;  Laterality: Left;  ? FINGER SURGERY  2002  ? LUMBAR LAMINECTOMY/DECOMPRESSION MICRODISCECTOMY Left 08/01/2018  ? Procedure: Microdiscectomy - Lumbar four-Lumbar five - left;  Surgeon: JEustace Moore MD;  [place]  Service: Neurosurgery;  Laterality: Left;  ? LUMBAR LAMINECTOMY/DECOMPRESSION MICRODISCECTOMY Left 10/15/2018  ? Procedure: Re-do Microdiscectomy - left - Lumbar four-Lumbar five;  Surgeon: JEustace Moore MD;  [place]  Service: Neurosurgery;  Laterality: Left;  ? [place] ? PATCH ANGIOPLASTY Left 06/05/2021  ? Procedure: PATCH ANGIOPLASTY USING XRueben BashBIOLOGIC PATCH;  Surgeon: CMarty Heck MD;  [place]  Service: Vascular;  Laterality: Left;  ? PERIPHERAL VASCULAR ATHERECTOMY  12/02/2019  ? Procedure: PERIPHERAL VASCULAR ATHERECTOMY;  Surgeon: CMarty Heck MD;  [place];  Service: Cardiovascular;;  left SFA  ? PERIPHERAL VASCULAR INTERVENTION  06/18/2018  ? Procedure: PERIPHERAL VASCULAR INTERVENTION;  Surgeon: CMarty Heck MD;  [place];  Service: Cardiovascular;;  Left external iliac  ? ROTATOR CUFF REPAIR Left   ? UMBILICAL HERNIA REPAIR    ? ? ?Current Medications: ?Current Meds  ?Medication Sig  ? amLODipine-benazepril (LOTREL) 10-40 MG  per capsule Take 1 capsule by mouth daily.  ? aspirin EC 81 MG EC tablet Take 1 tablet (81 mg total) by mouth daily at 6 (six) AM.  ? carvedilol (COREG) 12.5 MG tablet Take 1 tablet (12.5 mg total) by mouth 2 (two) times daily.  ? clopidogrel (PLAVIX) 75 MG tablet TAKE 1 TABLET BY MOUTH EVERY DAY  ? Continuous Blood Gluc Sensor (FREESTYLE LIBRE 2 SENSOR) MISC AS DIRECTED FOR BLOOD SUGAR CHECK DX E11.42  ? ezetimibe (ZETIA) 10 MG tablet Take 1 tablet (10 mg total) by mouth daily.  ? ferrous sulfate 325 (65 FE) MG tablet Take 325 mg by mouth daily with breakfast.  ? insulin degludec (TRESIBA FLEXTOUCH) 100 UNIT/ML FlexTouch Pen Inject 60 Units into the skin in the morning.  ? metFORMIN (GLUCOPHAGE-XR) 500 MG 24 hr tablet Take 1,000 mg by mouth 2 (two) times daily.  ? metoprolol tartrate (LOPRESSOR) 100 MG tablet Take 1 tablet (100 mg total) by mouth once for 1 dose. Take 90-120 minutes prior to scan.  ? ONETOUCH VERIO test strip 1 each 3 (three) times daily.  ? pantoprazole (PROTONIX) 40 MG tablet Take 40 mg by mouth daily with breakfast.   ?  pioglitazone (ACTOS) 45 MG tablet TAKE 1 TABLET DAILY (NEED APPOINTMENT FOR FURTHER REFILLS) (Patient taking differently: Take 45 mg by mouth daily.)  ? rosuvastatin (CRESTOR) 40 MG tablet Take 40 mg by mouth daily.   ? [DISCONTINUED] carvedilol (COREG) 6.25 MG tablet Take 1 tablet (6.25 mg total) by mouth 2 (two) times daily.  ?  ? ?Allergies:   Codeine  ? ?Social History  ? ?Socioeconomic History  ? Marital status: Married  ?  Spouse name: [name]   ? Number of children: 3  ? Years of education: 76  ? Highest education level: Not on file  ?Occupational History  ? Occupation: Administrator  ?  Employer: [employer]  ?Tobacco Use  ? Smoking status: Former  ?  Years: 40.00  ?  Types: Cigarettes  ?  Quit date: 12/2015  ?  Years since quitting: 5.6  ? Smokeless tobacco: Never  ? Tobacco comments:  ?  Quit 12-2012  ?Vaping Use  ? Vaping Use: Never used  ?Substance and Sexual Activity  ?  Alcohol use: No  ? Drug use: No  ? Sexual activity: Not on file  ?Other Topics Concern  ? Not on file  ?Social History Narrative  ? [name] Patient drinks 2-3 cups of caffeine daily.   ? Patient lives at home wit

## 2021-08-28 ENCOUNTER — Encounter: Payer: Self-pay | Admitting: Cardiology

## 2021-08-28 ENCOUNTER — Ambulatory Visit (INDEPENDENT_AMBULATORY_CARE_PROVIDER_SITE_OTHER): Payer: HMO | Admitting: Cardiology

## 2021-08-28 VITALS — BP 130/70 | HR 72 | Ht 70.0 in | Wt 172.0 lb

## 2021-08-28 DIAGNOSIS — E7800 Pure hypercholesterolemia, unspecified: Secondary | ICD-10-CM

## 2021-08-28 DIAGNOSIS — I350 Nonrheumatic aortic (valve) stenosis: Secondary | ICD-10-CM | POA: Diagnosis not present

## 2021-08-28 DIAGNOSIS — I6523 Occlusion and stenosis of bilateral carotid arteries: Secondary | ICD-10-CM

## 2021-08-28 DIAGNOSIS — I739 Peripheral vascular disease, unspecified: Secondary | ICD-10-CM

## 2021-08-28 DIAGNOSIS — R072 Precordial pain: Secondary | ICD-10-CM | POA: Diagnosis not present

## 2021-08-28 DIAGNOSIS — I10 Essential (primary) hypertension: Secondary | ICD-10-CM

## 2021-08-28 MED ORDER — CARVEDILOL 12.5 MG PO TABS: 12.5000 mg | ORAL_TABLET | Freq: Two times a day (BID) | ORAL | 2 refills | Status: DC

## 2021-08-28 MED ORDER — METOPROLOL TARTRATE 100 MG PO TABS: 100.0000 mg | ORAL_TABLET | Freq: Once | ORAL | 0 refills | Status: DC

## 2021-08-28 NOTE — Patient Instructions (Signed)
Medication Instructions:  ? ?INCREASE YOUR CARVEDILOL TO 12.5 MG BY MOUTH TWICE DAILY ? ? ?*If you need a refill on your cardiac medications before your next appointment, please call your pharmacy* ? ? ?Testing/Procedures: ? ?Your physician has requested that you have an echocardiogram. Echocardiography is a painless test that uses sound waves to create images of your heart. It provides your doctor with information about the size and shape of your heart and how well your heart?s chambers and valves are working. This procedure takes approximately one hour. There are no restrictions for this procedure. ? ? ? ?Your cardiac CT will be scheduled at one of the below locations:  ? ?[facility] ?[address] ?[place], [place] [address] ?[phone] ? ?If scheduled at [facility], please arrive at the [facility] Entrance (Entrance C2) of [facility] 30 minutes prior to test start time. ?You can use the FREE valet parking offered at entrance C (encouraged to control the heart rate for the test)  ?Proceed to the [facility] Radiology Department (first floor) to check-in and test prep. ? ?All radiology patients and guests should use entrance C2 at [facility], accessed from [facility], even though the hospital's physical address listed is [address]. ? ? ? ? ?Please follow these instructions carefully (unless otherwise directed): ? ?Hold all erectile dysfunction medications at least 3 days (72 hrs) prior to test. ? ?On the Night Before the Test: ?Be sure to Drink plenty of water. ?Do not consume any caffeinated/decaffeinated beverages or chocolate 12 hours prior to your test. ?Do not take any antihistamines 12 hours prior to your test. ? ?On the Day of the Test: ?Drink plenty of water until 1 hour prior to the test. ?Do not eat any food 4 hours prior to the test. ?You may take your regular medications prior to the test.  ?Take metoprolol 100 MG BY  MOUTH (Lopressor) two hours prior to test.  HOLD YOUR CARVEDILOL THE MORNING OF THIS TEST AND TAKE METOPROLOL AS ADVISED HERE IN THE INSTRUCTIONS 2 HOURS PRIOR TO YOUR CT. ? ?     ?After the Test: ?Drink plenty of water. ?After receiving IV contrast, you may experience a mild flushed feeling. This is normal. ?On occasion, you may experience a mild rash up to 24 hours after the test. This is not dangerous. If this occurs, you can take Benadryl 25 mg and increase your fluid intake. ?If you experience trouble breathing, this can be serious. If it is severe call 911 IMMEDIATELY. If it is mild, please call our office. ?If you take any of these medications: Glipizide/Metformin, Avandament, Glucavance, please do not take 48 hours after completing test unless otherwise instructed. ? ?We will call to schedule your test 2-4 weeks out understanding that some insurance companies will need an authorization prior to the service being performed.  ? ?For non-scheduling related questions, please contact the cardiac imaging nurse navigator should you have any questions/concerns: ?Marchia Bond, Cardiac Imaging Nurse Navigator ?Gordy Clement, Cardiac Imaging Nurse Navigator ?[facility] ?Direct Office Dial: [phone]  ? ?For scheduling needs, including cancellations and rescheduling, please call [place], [phone]. ? ? ?Follow-Up: ? ?3 MONTHS WITH DR. Johney Frame OR AN EXTENDER ? ? ? ?

## 2021-09-07 ENCOUNTER — Other Ambulatory Visit: Payer: Self-pay | Admitting: *Deleted

## 2021-09-07 ENCOUNTER — Ambulatory Visit (HOSPITAL_COMMUNITY): Payer: HMO | Attending: Cardiology

## 2021-09-07 ENCOUNTER — Encounter: Payer: Self-pay | Admitting: Cardiology

## 2021-09-07 DIAGNOSIS — I350 Nonrheumatic aortic (valve) stenosis: Secondary | ICD-10-CM

## 2021-09-07 DIAGNOSIS — R072 Precordial pain: Secondary | ICD-10-CM

## 2021-09-07 MED ORDER — METOPROLOL TARTRATE 100 MG PO TABS: 100.0000 mg | ORAL_TABLET | Freq: Once | ORAL | 0 refills | Status: DC

## 2021-09-07 NOTE — Progress Notes (Unsigned)
Patient ID: Luis Lindsey, male   DOB: Sep 06, 1950, 71 y.o.   MRN: [id number] ? ?Verified appointment "no show" status with Jess at 7:28am. ? ?

## 2021-09-12 ENCOUNTER — Telehealth: Payer: Self-pay | Admitting: *Deleted

## 2021-09-12 ENCOUNTER — Ambulatory Visit (HOSPITAL_COMMUNITY): Payer: HMO | Attending: Cardiology

## 2021-09-12 DIAGNOSIS — I350 Nonrheumatic aortic (valve) stenosis: Secondary | ICD-10-CM

## 2021-09-12 LAB — ECHOCARDIOGRAM COMPLETE
AR max vel: 1.47 cm2
AV Area VTI: 1.51 cm2
AV Area mean vel: 1.4 cm2
AV Mean grad: 12.5 mmHg
AV Peak grad: 19.4 mmHg
Ao pk vel: 2.2 m/s
Area-P 1/2: 3.3 cm2
S' Lateral: 2.7 cm

## 2021-09-12 NOTE — Telephone Encounter (Signed)
-----   Message from Freada Bergeron, MD sent at 09/12/2021  1:23 PM EDT ----- ?His echo is stable and shows that his pumping function is normal. His aortic valve remains mildly-to-moderately narrowed. We will continue to monitor with yearly echoes.  ?

## 2021-09-12 NOTE — Telephone Encounter (Signed)
The patient has been notified of the result and verbalized understanding.  All questions (if any) were answered. ? ?Pt aware I will go ahead and place the order for a repeat echo in one year in the system and send a message to our Echo Scheduler to call him back and arrange this appt for that time frame.  ?Pt verbalized understanding and agrees with this plan. ? ?

## 2021-09-18 ENCOUNTER — Telehealth: Payer: Self-pay | Admitting: *Deleted

## 2021-09-18 NOTE — Telephone Encounter (Signed)
-----   Message from Melony Overly sent at 09/18/2021 12:51 PM EDT ----- ?Regarding: ct heart ?Scheduled 09/27/21 at 4:00 ? ?

## 2021-09-18 NOTE — Telephone Encounter (Signed)
-----   Message from Melony Overly sent at 09/15/2021  6:18 PM EDT ----- ?Regarding: RE: CARDIAC CT PER DR. Johney Frame ?He was called twice on both phones. I think something is wrong with his phone. The line is either busy or the phone will just ring. I called his other line and left a message.  ?----- Message ----- ?From: Nuala Alpha, LPN ?Sent: 09/15/2021   9:52 AM EDT ?To: [place] ?Subject: FW: CARDIAC CT PER DR. Johney Frame              ? ?Checking the status of this pts cardiac CT.  He was asking about it.  ? ?Thanks for all you do, ?Karter Haire  ? ?----- Message ----- ?From: Nuala Alpha, LPN ?Sent: 08/28/2021   9:42 AM EDT ?To: Ciro Backer, Nuala Alpha, LPN, # ?Subject: CARDIAC CT PER DR. Johney Frame                  ? ?Dr. Johney Frame ordered for this pt to get a Cardiac CT for chest pain.   ?Order is in.  He will have to come back for a bmet.  ?Can you please schedule then let me know the date? ? ?Thanks all, ?Saim Almanza  ? ? ? ? ? ? ?

## 2021-09-26 ENCOUNTER — Other Ambulatory Visit: Payer: HMO | Admitting: *Deleted

## 2021-09-26 ENCOUNTER — Telehealth (HOSPITAL_COMMUNITY): Payer: Self-pay | Admitting: Emergency Medicine

## 2021-09-26 DIAGNOSIS — I350 Nonrheumatic aortic (valve) stenosis: Secondary | ICD-10-CM | POA: Diagnosis not present

## 2021-09-26 DIAGNOSIS — R072 Precordial pain: Secondary | ICD-10-CM

## 2021-09-26 LAB — BASIC METABOLIC PANEL
BUN/Creatinine Ratio: 15 (ref 10–24)
BUN: 17 mg/dL (ref 8–27)
CO2: 23 mmol/L (ref 20–29)
Calcium: 9.1 mg/dL (ref 8.6–10.2)
Chloride: 102 mmol/L (ref 96–106)
Creatinine, Ser: 1.14 mg/dL (ref 0.76–1.27)
Glucose: 354 mg/dL — ABNORMAL HIGH (ref 70–99)
Potassium: 4.4 mmol/L (ref 3.5–5.2)
Sodium: 135 mmol/L (ref 134–144)
eGFR: 69 mL/min/{1.73_m2} (ref >59)

## 2021-09-26 NOTE — Telephone Encounter (Signed)
Reaching out to patient to offer assistance regarding upcoming cardiac imaging study; pt verbalizes understanding of appt date/time, parking situation and where to check in, pre-test NPO status and medications ordered, and verified current allergies; name and call back number provided for further questions should they arise ?Marchia Bond RN Navigator Cardiac Imaging ?[facility] ?[phone] office ?[phone] cell ? ?Getting labs today ?'100mg'$  metoprolol tartrate ?Denies iv issues ?Arrival 330 ?

## 2021-09-27 ENCOUNTER — Ambulatory Visit (HOSPITAL_COMMUNITY)
Admission: RE | Admit: 2021-09-27 | Discharge: 2021-09-27 | Disposition: A | Discharge status: Home / Self Care | Payer: HMO | Source: Ambulatory Visit | Attending: Cardiology | Admitting: Cardiology

## 2021-09-27 DIAGNOSIS — R931 Abnormal findings on diagnostic imaging of heart and coronary circulation: Secondary | ICD-10-CM | POA: Insufficient documentation

## 2021-09-27 DIAGNOSIS — I350 Nonrheumatic aortic (valve) stenosis: Secondary | ICD-10-CM | POA: Insufficient documentation

## 2021-09-27 DIAGNOSIS — R072 Precordial pain: Secondary | ICD-10-CM | POA: Insufficient documentation

## 2021-09-27 DIAGNOSIS — R079 Chest pain, unspecified: Secondary | ICD-10-CM | POA: Insufficient documentation

## 2021-09-27 DIAGNOSIS — I2510 Atherosclerotic heart disease of native coronary artery without angina pectoris: Secondary | ICD-10-CM | POA: Diagnosis not present

## 2021-09-27 MED ORDER — IOHEXOL 350 MG/ML SOLN
100.0000 mL | Freq: Once | INTRAVENOUS | Status: AC | PRN
  Administered 2021-09-27: 100 mL via INTRAVENOUS

## 2021-09-27 MED ORDER — NITROGLYCERIN 0.4 MG SL SUBL
SUBLINGUAL_TABLET | SUBLINGUAL | Status: AC
  Filled 2021-09-27: qty 2

## 2021-09-27 MED ORDER — NITROGLYCERIN 0.4 MG SL SUBL
0.8000 mg | SUBLINGUAL_TABLET | Freq: Once | SUBLINGUAL | Status: AC
  Administered 2021-09-27: 0.8 mg via SUBLINGUAL

## 2021-09-28 ENCOUNTER — Other Ambulatory Visit (HOSPITAL_COMMUNITY): Payer: Self-pay | Admitting: Emergency Medicine

## 2021-09-28 ENCOUNTER — Ambulatory Visit (HOSPITAL_COMMUNITY)
Admission: RE | Admit: 2021-09-28 | Discharge: 2021-09-28 | Disposition: A | Discharge status: Home / Self Care | Payer: HMO | Source: Ambulatory Visit | Attending: Cardiology | Admitting: Cardiology

## 2021-09-28 ENCOUNTER — Ambulatory Visit (HOSPITAL_BASED_OUTPATIENT_CLINIC_OR_DEPARTMENT_OTHER)
Admission: RE | Admit: 2021-09-28 | Discharge: 2021-09-28 | Disposition: A | Discharge status: Home / Self Care | Payer: HMO | Source: Ambulatory Visit | Attending: Cardiology | Admitting: Cardiology

## 2021-09-28 DIAGNOSIS — R931 Abnormal findings on diagnostic imaging of heart and coronary circulation: Secondary | ICD-10-CM | POA: Diagnosis not present

## 2021-09-28 DIAGNOSIS — R079 Chest pain, unspecified: Secondary | ICD-10-CM

## 2021-09-28 DIAGNOSIS — I2510 Atherosclerotic heart disease of native coronary artery without angina pectoris: Secondary | ICD-10-CM | POA: Diagnosis not present

## 2021-10-02 ENCOUNTER — Telehealth: Payer: Self-pay

## 2021-10-02 DIAGNOSIS — Z01810 Encounter for preprocedural cardiovascular examination: Secondary | ICD-10-CM

## 2021-10-02 NOTE — Telephone Encounter (Signed)
-----   Message from Freada Bergeron, MD sent at 10/02/2021  8:51 AM EDT ----- ?Called and spoke to the patient about his CT scan results. He has significant LAD and RCA stenosis. Will need cath. He is amenable to proceed.  ?

## 2021-10-02 NOTE — Telephone Encounter (Addendum)
Appt made for the pt to see an APP 10/03/21 for Cath 10/05/21 ...  Pt verbalized understanding of his cath instructions sent to his My Chart.  ?

## 2021-10-03 ENCOUNTER — Encounter: Payer: Self-pay | Admitting: Physician Assistant

## 2021-10-03 ENCOUNTER — Other Ambulatory Visit: Payer: HMO

## 2021-10-03 ENCOUNTER — Ambulatory Visit (INDEPENDENT_AMBULATORY_CARE_PROVIDER_SITE_OTHER): Payer: HMO | Admitting: Physician Assistant

## 2021-10-03 ENCOUNTER — Other Ambulatory Visit: Payer: Self-pay | Admitting: Vascular Surgery

## 2021-10-03 VITALS — BP 118/64 | Ht 70.0 in | Wt 172.0 lb

## 2021-10-03 DIAGNOSIS — I25110 Atherosclerotic heart disease of native coronary artery with unstable angina pectoris: Secondary | ICD-10-CM

## 2021-10-03 DIAGNOSIS — I6523 Occlusion and stenosis of bilateral carotid arteries: Secondary | ICD-10-CM | POA: Diagnosis not present

## 2021-10-03 DIAGNOSIS — I739 Peripheral vascular disease, unspecified: Secondary | ICD-10-CM | POA: Diagnosis not present

## 2021-10-03 DIAGNOSIS — E782 Mixed hyperlipidemia: Secondary | ICD-10-CM

## 2021-10-03 DIAGNOSIS — I10 Essential (primary) hypertension: Secondary | ICD-10-CM | POA: Diagnosis not present

## 2021-10-03 DIAGNOSIS — Z01812 Encounter for preprocedural laboratory examination: Secondary | ICD-10-CM

## 2021-10-03 LAB — BASIC METABOLIC PANEL WITH GFR
BUN/Creatinine Ratio: 16 (ref 10–24)
BUN: 18 mg/dL (ref 8–27)
CO2: 23 mmol/L (ref 20–29)
Calcium: 9.1 mg/dL (ref 8.6–10.2)
Chloride: 104 mmol/L (ref 96–106)
Creatinine, Ser: 1.11 mg/dL (ref 0.76–1.27)
Glucose: 306 mg/dL — ABNORMAL HIGH (ref 70–99)
Potassium: 4.6 mmol/L (ref 3.5–5.2)
Sodium: 136 mmol/L (ref 134–144)
eGFR: 71 mL/min/1.73 (ref >59)

## 2021-10-03 LAB — CBC
Hematocrit: 31.5 % — ABNORMAL LOW (ref 37.5–51.0)
Hemoglobin: 10.3 g/dL — ABNORMAL LOW (ref 13.0–17.7)
MCH: 28.6 pg (ref 26.6–33.0)
MCHC: 32.7 g/dL (ref 31.5–35.7)
MCV: 88 fL (ref 79–97)
Platelets: 294 x10E3/uL (ref 150–450)
RBC: 3.6 x10E6/uL — ABNORMAL LOW (ref 4.14–5.80)
RDW: 14.9 % (ref 11.6–15.4)
WBC: 5.5 x10E3/uL (ref 3.4–10.8)

## 2021-10-03 NOTE — Patient Instructions (Signed)
Medication Instructions:  ?Your physician recommends that you continue on your current medications as directed. Please refer to the Current Medication list given to you today. ? ?*If you need a refill on your cardiac medications before your next appointment, please call your pharmacy* ? ? ?Lab Work: ?Bmp, Cbc- today  ? ?If you have labs (blood work) drawn today and your tests are completely normal, you will receive your results only by: ?MyChart Message (if you have MyChart) OR ?A paper copy in the mail ?If you have any lab test that is abnormal or we need to change your treatment, we will call you to review the results. ? ? ?Testing/Procedures: ?Your physician has requested that you have a cardiac catheterization. Cardiac catheterization is used to diagnose and/or treat various heart conditions. Doctors may recommend this procedure for a number of different reasons. The most common reason is to evaluate chest pain. Chest pain can be a symptom of coronary artery disease (CAD), and cardiac catheterization can show whether plaque is narrowing or blocking your heart?s arteries. This procedure is also used to evaluate the valves, as well as measure the blood flow and oxygen levels in different parts of your heart. For further information please visit [email or web address]. Please follow instruction sheet, as given. ? ? ? ?Follow-Up: ?Follow up as scheduled :1}  ? ? ?Other Instructions ? ? ?Important Information About Sugar ? ? ? ? ?  ?

## 2021-10-03 NOTE — Progress Notes (Signed)
?Cardiology Office Note:   ? ?Date:  10/03/2021  ? ?ID:  Luis Lindsey, DOB 1950-12-13, MRN [id number] ? ?PCP:  Alroy Dust, [name], MD  ?[facility] Cardiologist:  Freada Bergeron, MD  ?[facility] Electrophysiologist:  None  ? ?Chief Complaint: cath discussion.  ? ?History of Present Illness:   ? ?Luis Lindsey is a 71 y.o. male with a hx of of PAD s/p L SFA atherectomy with angioplasty, carotid artery disease s/p L CEA, HTN, HLD, moderate AS, and tobacco abuse seen for cath discussion.  ? ?Patient was initially seen in 03/2020 after having episodes of lightheadedness in the setting of anemia, moderate AS, and relative hypotension. We stopped his HCTZ and decreased his metop to '25mg'$  XL daily. Symptoms resolved. TTE 08/2019:Normal EF. Moderate aortic valve stenosis. Aortic valve area, by VTI measures 1.21 cm?[name] Aortic valve mean gradient measures 14.5 mmHg. Aortic valve Vmax measures 2.76 m/s.  ? ?Had abdominal aortogram in 02/2021 with Dr. Carlis Abbott where he was found to have ostial high grade SFA stenosis. Was recommended for bypass at that time. He subsequently underwent  left common femoral endarterectomy with profundoplasty and endarterectomy of the proximal SFA with bovine pericardial patch angioplasty and left common femoral to below-knee popliteal artery bypass on 06/05/21 without issues. Was discharged on ASA and plavix.  ? ?Recently having chest pain concerning for angina.  ?Coronary CTA 09/2021: ?IMPRESSION: ?1. Coronary artery calcium score 4204 Agatston units. This places ?the patient in the 98th percentile for age and gender. ?  ?2.  Aortic valve calcium score 1928 Agatston units. ?  ?3. Very heavy proximal-mid coronary calcification so difficult to ?quantify degree of stenosis. I suspect that there is severe ostial ?RCA stenosis. Proximal LAD, ramus, and LCx appear moderately ?stenosed. Possible severe mid LCx stenosis. Will send for FFR. ? ?CT FFR: ? ?1. Left Main: No significant stenosis. ?2. LAD:  0.64 mid LAD. ?3. LCX: Ramus mid to distal vessel 0.56.  0.77 mid LCx. ?4. RCA: 0.95 proximal RCA, 0.75 distal RCA ? ?Here for cath discussion.  He continues to have left-sided chest pain.  Describing as pressure sensation.  Previously it was midsternal.  Denies palpitation, dizziness, orthopnea, PND, syncope or melena.  Compliant with medication. ? ?Past Medical History:  ?Diagnosis Date  ? Anemia   ? Aortic stenosis   ? Arthritis   ? BCC (basal cell carcinoma)   ? BPH (benign prostatic hyperplasia)   ? CAROTID BRUIT, RIGHT 11/30/2009  ? Dizziness   ? DM 11/30/2009  ? Dysphagia   ? Esophageal dysmotility   ? GERD (gastroesophageal reflux disease)   ? Heart murmur   ? History of hiatal hernia   ? History of Holter monitoring 2009  ? History of kidney stones   ? HNP (herniated nucleus pulposus), lumbar   ? L4-5  ? HYPERCHOLESTEROLEMIA 11/30/2009  ? Hyperlipidemia   ? HYPERTENSION 11/30/2009  ? Kidney stones   ? Numbness and tingling   ? Peripheral vascular disease ([place])   ? Right carotid bruit   ? SMOKER 11/30/2009  ? Stroke [facility]) 2012  ? URINARY CALCULUS 11/30/2009  ? ? ?Past Surgical History:  ?Procedure Laterality Date  ? ABDOMINAL AORTOGRAM W/LOWER EXTREMITY N/A 06/18/2018  ? Procedure: ABDOMINAL AORTOGRAM W/LOWER EXTREMITY;  Surgeon: Marty Heck, MD;  Location: [place] CV [facility];  Service: Cardiovascular;  Laterality: N/A;  ? ABDOMINAL AORTOGRAM W/LOWER EXTREMITY N/A 12/02/2019  ? Procedure: ABDOMINAL AORTOGRAM W/LOWER EXTREMITY;  Surgeon: Marty Heck, MD;  Location: [place] CV [facility];  Service: Cardiovascular;  Laterality: N/A;  ? ABDOMINAL AORTOGRAM W/LOWER EXTREMITY N/A 03/23/2021  ? Procedure: ABDOMINAL AORTOGRAM W/LOWER EXTREMITY;  Surgeon: Marty Heck, MD;  Location: [place] [facility];  Service: Cardiovascular;  Laterality: N/A;  ? arthroscopy of right shoulder Right   ? Jan 2019, June 2019-- done at [place], Dr. Ninfa Linden  ? BACK SURGERY    ? CATARACT  EXTRACTION W/ INTRAOCULAR LENS  IMPLANT, BILATERAL    ? CYSTOSCOPY    ? ENDARTERECTOMY Right 08/24/2019  ? Procedure: ENDARTERECTOMY CAROTID;  Surgeon: Marty Heck, MD;  Location: [place];  Service: Vascular;  Laterality: Right;  ? ENDARTERECTOMY FEMORAL Left 06/05/2021  ? Procedure: ENDARTERECTOMY FEMORAL WITH PROFUNDAPLASTY;  Surgeon: Marty Heck, MD;  Location: [place];  Service: Vascular;  Laterality: Left;  ? FACIAL COSMETIC SURGERY  2014  ? FEMORAL-POPLITEAL BYPASS GRAFT Left 06/05/2021  ? Procedure: LEFT FEMORAL-POPLITEAL ARTERY BYPASS GRAFTING USING 67m PROPATEN VASCULAR REMOVABLE RING GRAFT;  Surgeon: CMarty Heck MD;  [place]  Service: Vascular;  Laterality: Left;  ? FINGER SURGERY  2002  ? LUMBAR LAMINECTOMY/DECOMPRESSION MICRODISCECTOMY Left 08/01/2018  ? Procedure: Microdiscectomy - Lumbar four-Lumbar five - left;  Surgeon: JEustace Moore MD;  [place]  Service: Neurosurgery;  Laterality: Left;  ? LUMBAR LAMINECTOMY/DECOMPRESSION MICRODISCECTOMY Left 10/15/2018  ? Procedure: Re-do Microdiscectomy - left - Lumbar four-Lumbar five;  Surgeon: JEustace Moore MD;  [place]  Service: Neurosurgery;  Laterality: Left;  ? [place] ? PATCH ANGIOPLASTY Left 06/05/2021  ? Procedure: PATCH ANGIOPLASTY USING XRueben BashBIOLOGIC PATCH;  Surgeon: CMarty Heck MD;  [place]  Service: Vascular;  Laterality: Left;  ? PERIPHERAL VASCULAR ATHERECTOMY  12/02/2019  ? Procedure: PERIPHERAL VASCULAR ATHERECTOMY;  Surgeon: CMarty Heck MD;  [place];  Service: Cardiovascular;;  left SFA  ? PERIPHERAL VASCULAR INTERVENTION  06/18/2018  ? Procedure: PERIPHERAL VASCULAR INTERVENTION;  Surgeon: CMarty Heck MD;  [place] [facility];  Service: Cardiovascular;;  Left external iliac  ? ROTATOR CUFF REPAIR Left   ? UMBILICAL HERNIA REPAIR    ? ? ?Current Medications: ?Current Meds  ?Medication Sig  ? amLODipine-benazepril  (LOTREL) 10-40 MG per capsule Take 1 capsule by mouth daily.  ? aspirin EC 81 MG EC tablet Take 1 tablet (81 mg total) by mouth daily at 6 (six) AM.  ? carvedilol (COREG) 12.5 MG tablet Take 1 tablet (12.5 mg total) by mouth 2 (two) times daily.  ? clopidogrel (PLAVIX) 75 MG tablet TAKE 1 TABLET BY MOUTH EVERY DAY  ? Continuous Blood Gluc Sensor (FREESTYLE LIBRE 2 SENSOR) MISC AS DIRECTED FOR BLOOD SUGAR CHECK DX E11.42  ? ezetimibe (ZETIA) 10 MG tablet Take 1 tablet (10 mg total) by mouth daily.  ? ferrous sulfate 325 (65 FE) MG tablet Take 325 mg by mouth daily with breakfast.  ? insulin degludec (TRESIBA) 200 UNIT/ML FlexTouch Pen Inject 60 Units into the skin in the morning.  ? metFORMIN (GLUCOPHAGE-XR) 500 MG 24 hr tablet Take 1,000 mg by mouth 2 (two) times daily.  ? ONETOUCH VERIO test strip 1 each 3 (three) times daily.  ? pantoprazole (PROTONIX) 40 MG tablet Take 40 mg by mouth daily with breakfast.   ? pioglitazone (ACTOS) 45 MG tablet TAKE 1 TABLET DAILY (NEED APPOINTMENT FOR FURTHER REFILLS) (Patient taking differently: Take 45 mg by mouth daily.)  ? rosuvastatin (  CRESTOR) 40 MG tablet Take 40 mg by mouth daily.   ?  ? ?Allergies:   Codeine  ? ?Social History  ? ?Socioeconomic History  ? Marital status: Married  ?  Spouse name: [name]   ? Number of children: 3  ? Years of education: 28  ? Highest education level: Not on file  ?Occupational History  ? Occupation: Administrator  ?  Employer: [employer]  ?Tobacco Use  ? Smoking status: Former  ?  Years: 40.00  ?  Types: Cigarettes  ?  Quit date: 12/2015  ?  Years since quitting: 5.7  ? Smokeless tobacco: Never  ? Tobacco comments:  ?  Quit 12-2012  ?Vaping Use  ? Vaping Use: Never used  ?Substance and Sexual Activity  ? Alcohol use: No  ? Drug use: No  ? Sexual activity: Not on file  ?Other Topics Concern  ? Not on file  ?Social History Narrative  ? [name] Patient drinks 2-3 cups of caffeine daily.   ? Patient lives at home with his wife [name].   ? Patient works at  [employer].   ? Education. 12 th grade  ? Right handed  ?   ?   ?   ?   ? ?Social Determinants of Health  ? ?Financial Resource Strain: Not on file  ?Food Insecurity: Not on file  ?Transportation Needs: Not on file  ?Physic

## 2021-10-03 NOTE — H&P (View-Only) (Signed)
?Cardiology Office Note:   ? ?Date:  10/03/2021  ? ?ID:  Luis Lindsey, DOB 02-Oct-1950, MRN [id number] ? ?PCP:  Alroy Dust, [name], MD  ?[facility] Cardiologist:  Freada Bergeron, MD  ?[facility] Electrophysiologist:  None  ? ?Chief Complaint: cath discussion.  ? ?History of Present Illness:   ? ?Luis Lindsey is a 71 y.o. male with a hx of of PAD s/p L SFA atherectomy with angioplasty, carotid artery disease s/p L CEA, HTN, HLD, moderate AS, and tobacco abuse seen for cath discussion.  ? ?Patient was initially seen in 03/2020 after having episodes of lightheadedness in the setting of anemia, moderate AS, and relative hypotension. We stopped his HCTZ and decreased his metop to '25mg'$  XL daily. Symptoms resolved. TTE 08/2019:Normal EF. Moderate aortic valve stenosis. Aortic valve area, by VTI measures 1.21 cm?[name] Aortic valve mean gradient measures 14.5 mmHg. Aortic valve Vmax measures 2.76 m/s.  ? ?Had abdominal aortogram in 02/2021 with Dr. Carlis Abbott where he was found to have ostial high grade SFA stenosis. Was recommended for bypass at that time. He subsequently underwent  left common femoral endarterectomy with profundoplasty and endarterectomy of the proximal SFA with bovine pericardial patch angioplasty and left common femoral to below-knee popliteal artery bypass on 06/05/21 without issues. Was discharged on ASA and plavix.  ? ?Recently having chest pain concerning for angina.  ?Coronary CTA 09/2021: ?IMPRESSION: ?1. Coronary artery calcium score 4204 Agatston units. This places ?the patient in the 98th percentile for age and gender. ?  ?2.  Aortic valve calcium score 1928 Agatston units. ?  ?3. Very heavy proximal-mid coronary calcification so difficult to ?quantify degree of stenosis. I suspect that there is severe ostial ?RCA stenosis. Proximal LAD, ramus, and LCx appear moderately ?stenosed. Possible severe mid LCx stenosis. Will send for FFR. ? ?CT FFR: ? ?1. Left Main: No significant stenosis. ?2. LAD:  0.64 mid LAD. ?3. LCX: Ramus mid to distal vessel 0.56.  0.77 mid LCx. ?4. RCA: 0.95 proximal RCA, 0.75 distal RCA ? ?Here for cath discussion.  He continues to have left-sided chest pain.  Describing as pressure sensation.  Previously it was midsternal.  Denies palpitation, dizziness, orthopnea, PND, syncope or melena.  Compliant with medication. ? ?Past Medical History:  ?Diagnosis Date  ? Anemia   ? Aortic stenosis   ? Arthritis   ? BCC (basal cell carcinoma)   ? BPH (benign prostatic hyperplasia)   ? CAROTID BRUIT, RIGHT 11/30/2009  ? Dizziness   ? DM 11/30/2009  ? Dysphagia   ? Esophageal dysmotility   ? GERD (gastroesophageal reflux disease)   ? Heart murmur   ? History of hiatal hernia   ? History of Holter monitoring 2009  ? History of kidney stones   ? HNP (herniated nucleus pulposus), lumbar   ? L4-5  ? HYPERCHOLESTEROLEMIA 11/30/2009  ? Hyperlipidemia   ? HYPERTENSION 11/30/2009  ? Kidney stones   ? Numbness and tingling   ? Peripheral vascular disease ([place])   ? Right carotid bruit   ? SMOKER 11/30/2009  ? Stroke [name]) 2012  ? URINARY CALCULUS 11/30/2009  ? ? ?Past Surgical History:  ?Procedure Laterality Date  ? ABDOMINAL AORTOGRAM W/LOWER EXTREMITY N/A 06/18/2018  ? Procedure: ABDOMINAL AORTOGRAM W/LOWER EXTREMITY;  Surgeon: Marty Heck, MD;  Location: [place] [facility];  Service: Cardiovascular;  Laterality: N/A;  ? ABDOMINAL AORTOGRAM W/LOWER EXTREMITY N/A 12/02/2019  ? Procedure: ABDOMINAL AORTOGRAM W/LOWER EXTREMITY;  Surgeon: Marty Heck, MD;  Location: [place] [facility];  Service: Cardiovascular;  Laterality: N/A;  ? ABDOMINAL AORTOGRAM W/LOWER EXTREMITY N/A 03/23/2021  ? Procedure: ABDOMINAL AORTOGRAM W/LOWER EXTREMITY;  Surgeon: Marty Heck, MD;  Location: [place] CV [facility];  Service: Cardiovascular;  Laterality: N/A;  ? arthroscopy of right shoulder Right   ? Jan 2019, June 2019-- done at [place], Dr. Ninfa Linden  ? BACK SURGERY    ? CATARACT  EXTRACTION W/ INTRAOCULAR LENS  IMPLANT, BILATERAL    ? CYSTOSCOPY    ? ENDARTERECTOMY Right 08/24/2019  ? Procedure: ENDARTERECTOMY CAROTID;  Surgeon: Marty Heck, MD;  Location: [place];  Service: Vascular;  Laterality: Right;  ? ENDARTERECTOMY FEMORAL Left 06/05/2021  ? Procedure: ENDARTERECTOMY FEMORAL WITH PROFUNDAPLASTY;  Surgeon: Marty Heck, MD;  Location: [place];  Service: Vascular;  Laterality: Left;  ? FACIAL COSMETIC SURGERY  2014  ? FEMORAL-POPLITEAL BYPASS GRAFT Left 06/05/2021  ? Procedure: LEFT FEMORAL-POPLITEAL ARTERY BYPASS GRAFTING USING 75m PROPATEN VASCULAR REMOVABLE RING GRAFT;  Surgeon: CMarty Heck MD;  [place]  Service: Vascular;  Laterality: Left;  ? FINGER SURGERY  2002  ? LUMBAR LAMINECTOMY/DECOMPRESSION MICRODISCECTOMY Left 08/01/2018  ? Procedure: Microdiscectomy - Lumbar four-Lumbar five - left;  Surgeon: JEustace Moore MD;  [place]  Service: Neurosurgery;  Laterality: Left;  ? LUMBAR LAMINECTOMY/DECOMPRESSION MICRODISCECTOMY Left 10/15/2018  ? Procedure: Re-do Microdiscectomy - left - Lumbar four-Lumbar five;  Surgeon: JEustace Moore MD;  [place]  Service: Neurosurgery;  Laterality: Left;  ? [place] ? PATCH ANGIOPLASTY Left 06/05/2021  ? Procedure: PATCH ANGIOPLASTY USING XRueben BashBIOLOGIC PATCH;  Surgeon: CMarty Heck MD;  [place]  Service: Vascular;  Laterality: Left;  ? PERIPHERAL VASCULAR ATHERECTOMY  12/02/2019  ? Procedure: PERIPHERAL VASCULAR ATHERECTOMY;  Surgeon: CMarty Heck MD;  [place];  Service: Cardiovascular;;  left SFA  ? PERIPHERAL VASCULAR INTERVENTION  06/18/2018  ? Procedure: PERIPHERAL VASCULAR INTERVENTION;  Surgeon: CMarty Heck MD;  [place];  Service: Cardiovascular;;  Left external iliac  ? ROTATOR CUFF REPAIR Left   ? UMBILICAL HERNIA REPAIR    ? ? ?Current Medications: ?Current Meds  ?Medication Sig  ? amLODipine-benazepril  (LOTREL) 10-40 MG per capsule Take 1 capsule by mouth daily.  ? aspirin EC 81 MG EC tablet Take 1 tablet (81 mg total) by mouth daily at 6 (six) AM.  ? carvedilol (COREG) 12.5 MG tablet Take 1 tablet (12.5 mg total) by mouth 2 (two) times daily.  ? clopidogrel (PLAVIX) 75 MG tablet TAKE 1 TABLET BY MOUTH EVERY DAY  ? Continuous Blood Gluc Sensor (FREESTYLE LIBRE 2 SENSOR) MISC AS DIRECTED FOR BLOOD SUGAR CHECK DX E11.42  ? ezetimibe (ZETIA) 10 MG tablet Take 1 tablet (10 mg total) by mouth daily.  ? ferrous sulfate 325 (65 FE) MG tablet Take 325 mg by mouth daily with breakfast.  ? insulin degludec (TRESIBA) 200 UNIT/ML FlexTouch Pen Inject 60 Units into the skin in the morning.  ? metFORMIN (GLUCOPHAGE-XR) 500 MG 24 hr tablet Take 1,000 mg by mouth 2 (two) times daily.  ? ONETOUCH VERIO test strip 1 each 3 (three) times daily.  ? pantoprazole (PROTONIX) 40 MG tablet Take 40 mg by mouth daily with breakfast.   ? pioglitazone (ACTOS) 45 MG tablet TAKE 1 TABLET DAILY (NEED APPOINTMENT FOR FURTHER REFILLS) (Patient taking differently: Take 45 mg by mouth daily.)  ? rosuvastatin (  CRESTOR) 40 MG tablet Take 40 mg by mouth daily.   ?  ? ?Allergies:   Codeine  ? ?Social History  ? ?Socioeconomic History  ? Marital status: Married  ?  Spouse name: [name]   ? Number of children: 3  ? Years of education: 5  ? Highest education level: Not on file  ?Occupational History  ? Occupation: Administrator  ?  Employer: [employer]  ?Tobacco Use  ? Smoking status: Former  ?  Years: 40.00  ?  Types: Cigarettes  ?  Quit date: 12/2015  ?  Years since quitting: 5.7  ? Smokeless tobacco: Never  ? Tobacco comments:  ?  Quit 12-2012  ?Vaping Use  ? Vaping Use: Never used  ?Substance and Sexual Activity  ? Alcohol use: No  ? Drug use: No  ? Sexual activity: Not on file  ?Other Topics Concern  ? Not on file  ?Social History Narrative  ? [name] Patient drinks 2-3 cups of caffeine daily.   ? Patient lives at home with his wife [name].   ? Patient works at  [employer].   ? Education. 12 th grade  ? Right handed  ?   ?   ?   ?   ? ?Social Determinants of Health  ? ?Financial Resource Strain: Not on file  ?Food Insecurity: Not on file  ?Transportation Needs: Not on file  ?Physic

## 2021-10-04 ENCOUNTER — Telehealth: Payer: Self-pay | Admitting: *Deleted

## 2021-10-04 NOTE — Telephone Encounter (Signed)
Cardiac Catheterization scheduled at [facility] for: Thursday Oct 05, 2021 10:30 AM ?Arrival time and place: [place] Entrance A at: 8:30 AM ? ? ?Nothing to eat after midnight prior to procedure, clear liquids until 5 AM day of procedure. ? ? ?Medication instructions: ?-Hold: ? Treshiba-AM of procedure ? Metformin-day of procedure and 48 hours post procedure  ? Actos-AM of procedure  ?-Except hold medications usual morning medications can be taken with sips of water including aspirin 81 mg and Plavix 75 mg. ? ?Confirmed patient has responsible adult to drive home post procedure and be with patient first 24 hours after arriving home. ? ?Patient reports no new symptoms concerning for COVID-19/no exposure to COVID-19 in the past 10 days. ? ?Reviewed procedure instructions with patient.  ? ?

## 2021-10-05 ENCOUNTER — Ambulatory Visit (HOSPITAL_COMMUNITY)
Admission: RE | Admit: 2021-10-05 | Discharge: 2021-10-05 | Disposition: A | Discharge status: Home / Self Care | Payer: HMO | Attending: Internal Medicine | Admitting: Internal Medicine

## 2021-10-05 ENCOUNTER — Other Ambulatory Visit: Payer: Self-pay

## 2021-10-05 ENCOUNTER — Encounter (HOSPITAL_COMMUNITY)
Admission: RE | Disposition: A | Discharge status: Home / Self Care | Payer: Self-pay | Source: Home / Self Care | Attending: Internal Medicine

## 2021-10-05 DIAGNOSIS — Z87891 Personal history of nicotine dependence: Secondary | ICD-10-CM | POA: Insufficient documentation

## 2021-10-05 DIAGNOSIS — Z79899 Other long term (current) drug therapy: Secondary | ICD-10-CM | POA: Diagnosis not present

## 2021-10-05 DIAGNOSIS — I10 Essential (primary) hypertension: Secondary | ICD-10-CM | POA: Insufficient documentation

## 2021-10-05 DIAGNOSIS — Z7902 Long term (current) use of antithrombotics/antiplatelets: Secondary | ICD-10-CM | POA: Insufficient documentation

## 2021-10-05 DIAGNOSIS — I350 Nonrheumatic aortic (valve) stenosis: Secondary | ICD-10-CM | POA: Insufficient documentation

## 2021-10-05 DIAGNOSIS — E785 Hyperlipidemia, unspecified: Secondary | ICD-10-CM | POA: Diagnosis not present

## 2021-10-05 DIAGNOSIS — I2510 Atherosclerotic heart disease of native coronary artery without angina pectoris: Secondary | ICD-10-CM | POA: Insufficient documentation

## 2021-10-05 DIAGNOSIS — R931 Abnormal findings on diagnostic imaging of heart and coronary circulation: Secondary | ICD-10-CM | POA: Diagnosis not present

## 2021-10-05 DIAGNOSIS — I25118 Atherosclerotic heart disease of native coronary artery with other forms of angina pectoris: Secondary | ICD-10-CM | POA: Diagnosis not present

## 2021-10-05 DIAGNOSIS — Z7982 Long term (current) use of aspirin: Secondary | ICD-10-CM | POA: Diagnosis not present

## 2021-10-05 DIAGNOSIS — I6522 Occlusion and stenosis of left carotid artery: Secondary | ICD-10-CM | POA: Insufficient documentation

## 2021-10-05 DIAGNOSIS — I739 Peripheral vascular disease, unspecified: Secondary | ICD-10-CM | POA: Diagnosis not present

## 2021-10-05 DIAGNOSIS — R0602 Shortness of breath: Secondary | ICD-10-CM | POA: Insufficient documentation

## 2021-10-05 HISTORY — PX: LEFT HEART CATH AND CORONARY ANGIOGRAPHY: CATH118249

## 2021-10-05 LAB — GLUCOSE, CAPILLARY: Glucose-Capillary: 230 mg/dL — ABNORMAL HIGH (ref 70–99)

## 2021-10-05 SURGERY — LEFT HEART CATH AND CORONARY ANGIOGRAPHY
Anesthesia: LOCAL

## 2021-10-05 MED ORDER — FENTANYL CITRATE (PF) 100 MCG/2ML IJ SOLN
INTRAMUSCULAR | Status: DC | PRN
Start: 2021-10-05 — End: 2021-10-05
  Administered 2021-10-05: 25 ug via INTRAVENOUS

## 2021-10-05 MED ORDER — VERAPAMIL HCL 2.5 MG/ML IV SOLN
INTRAVENOUS | Status: DC | PRN
  Administered 2021-10-05: 10 mL via INTRA_ARTERIAL

## 2021-10-05 MED ORDER — SODIUM CHLORIDE 0.9 % IV SOLN
250.0000 mL | INTRAVENOUS | Status: DC | PRN
Start: 2021-10-05 — End: 2021-10-05

## 2021-10-05 MED ORDER — SODIUM CHLORIDE 0.9% FLUSH: 3.0000 mL | INTRAVENOUS | Status: DC | PRN

## 2021-10-05 MED ORDER — SODIUM CHLORIDE 0.9% FLUSH: 3.0000 mL | Freq: Two times a day (BID) | INTRAVENOUS | Status: DC

## 2021-10-05 MED ORDER — ONDANSETRON HCL 4 MG/2ML IJ SOLN: 4.0000 mg | Freq: Four times a day (QID) | INTRAMUSCULAR | Status: DC | PRN

## 2021-10-05 MED ORDER — HYDRALAZINE HCL 20 MG/ML IJ SOLN: 10.0000 mg | INTRAMUSCULAR | Status: DC | PRN

## 2021-10-05 MED ORDER — LABETALOL HCL 5 MG/ML IV SOLN: 10.0000 mg | INTRAVENOUS | Status: DC | PRN

## 2021-10-05 MED ORDER — SODIUM CHLORIDE 0.9 % WEIGHT BASED INFUSION
3.0000 mL/kg/h | INTRAVENOUS | Status: AC
  Administered 2021-10-05: 3 mL/kg/h via INTRAVENOUS

## 2021-10-05 MED ORDER — SODIUM CHLORIDE 0.9 % IV SOLN: INTRAVENOUS | Status: DC

## 2021-10-05 MED ORDER — LIDOCAINE HCL (PF) 1 % IJ SOLN
INTRAMUSCULAR | Status: AC
  Filled 2021-10-05: qty 30

## 2021-10-05 MED ORDER — HEPARIN SODIUM (PORCINE) 1000 UNIT/ML IJ SOLN
INTRAMUSCULAR | Status: AC
  Filled 2021-10-05: qty 10

## 2021-10-05 MED ORDER — MIDAZOLAM HCL 2 MG/2ML IJ SOLN
INTRAMUSCULAR | Status: AC
  Filled 2021-10-05: qty 2

## 2021-10-05 MED ORDER — VERAPAMIL HCL 2.5 MG/ML IV SOLN
INTRAVENOUS | Status: AC
  Filled 2021-10-05: qty 2

## 2021-10-05 MED ORDER — NITROGLYCERIN 0.4 MG SL SUBL: 0.4000 mg | SUBLINGUAL_TABLET | SUBLINGUAL | 99 refills | Status: DC | PRN

## 2021-10-05 MED ORDER — METFORMIN HCL ER 500 MG PO TB24
1000.0000 mg | ORAL_TABLET | Freq: Two times a day (BID) | ORAL | Status: AC
Start: 2021-10-08 — End: ?

## 2021-10-05 MED ORDER — LIDOCAINE HCL (PF) 1 % IJ SOLN
INTRAMUSCULAR | Status: DC | PRN
  Administered 2021-10-05: 2 mL

## 2021-10-05 MED ORDER — HEPARIN SODIUM (PORCINE) 1000 UNIT/ML IJ SOLN
INTRAMUSCULAR | Status: DC | PRN
  Administered 2021-10-05: 4000 [IU] via INTRAVENOUS

## 2021-10-05 MED ORDER — HEPARIN (PORCINE) IN NACL 1000-0.9 UT/500ML-% IV SOLN
INTRAVENOUS | Status: DC | PRN
  Administered 2021-10-05 (×2): 500 mL

## 2021-10-05 MED ORDER — SODIUM CHLORIDE 0.9 % WEIGHT BASED INFUSION: 1.0000 mL/kg/h | INTRAVENOUS | Status: DC

## 2021-10-05 MED ORDER — FENTANYL CITRATE (PF) 100 MCG/2ML IJ SOLN
INTRAMUSCULAR | Status: AC
  Filled 2021-10-05: qty 2

## 2021-10-05 MED ORDER — HEPARIN (PORCINE) IN NACL 1000-0.9 UT/500ML-% IV SOLN
INTRAVENOUS | Status: AC
  Filled 2021-10-05: qty 1000

## 2021-10-05 MED ORDER — IOHEXOL 350 MG/ML SOLN
INTRAVENOUS | Status: DC | PRN
  Administered 2021-10-05: 60 mL

## 2021-10-05 MED ORDER — ACETAMINOPHEN 325 MG PO TABS: 650.0000 mg | ORAL_TABLET | ORAL | Status: DC | PRN

## 2021-10-05 MED ORDER — ASPIRIN 81 MG PO CHEW
81.0000 mg | CHEWABLE_TABLET | ORAL | Status: DC
Start: 2021-10-05 — End: 2021-10-05

## 2021-10-05 MED ORDER — MIDAZOLAM HCL 2 MG/2ML IJ SOLN
INTRAMUSCULAR | Status: DC | PRN
  Administered 2021-10-05: 1 mg via INTRAVENOUS

## 2021-10-05 SURGICAL SUPPLY — 11 items
CATH 5FR JL3.5 JR4 ANG PIG MP (CATHETERS) ×1 IMPLANT
CATH INFINITI 5 FR MPA2 (CATHETERS) ×1 IMPLANT
DEVICE RAD COMP TR BAND LRG (VASCULAR PRODUCTS) ×1 IMPLANT
GLIDESHEATH SLEND A-KIT 6F 22G (SHEATH) ×1 IMPLANT
GUIDEWIRE INQWIRE 1.5J.035X260 (WIRE) IMPLANT
INQWIRE 1.5J .035X260CM (WIRE) ×2
KIT HEART LEFT (KITS) ×2 IMPLANT
PACK CARDIAC CATHETERIZATION (CUSTOM PROCEDURE TRAY) ×2 IMPLANT
SYR MEDRAD MARK 7 150ML (SYRINGE) ×2 IMPLANT
TRANSDUCER W/STOPCOCK (MISCELLANEOUS) ×2 IMPLANT
TUBING CIL FLEX 10 FLL-RA (TUBING) ×2 IMPLANT

## 2021-10-05 NOTE — Progress Notes (Signed)
Patient and family was given discharge instructions. Both verbalized understanding. ?

## 2021-10-05 NOTE — Discharge Instructions (Signed)

## 2021-10-05 NOTE — Interval H&P Note (Signed)
History and Physical Interval Note: ? ?10/05/2021 ?11:03 AM ? ?[name]  has presented today for surgery, with the diagnosis of coronary artery disease.  The various methods of treatment have been discussed with the patient and family. After consideration of risks, benefits and other options for treatment, the patient has consented to  Procedure(s): ?LEFT HEART CATH AND CORONARY ANGIOGRAPHY (N/A) as a surgical intervention.  The patient's history has been reviewed, patient examined, no change in status, stable for surgery.  I have reviewed the patient's chart and labs.  Questions were answered to the patient's satisfaction.   ? ?Cath Lab Visit (complete for each Cath Lab visit) ? ?Clinical Evaluation Leading to the Procedure:  ? ?ACS: No. ? ?Non-ACS:   ? ?Anginal Classification: CCS III ? ?Anti-ischemic medical therapy: Maximal Therapy (2 or more classes of medications) ? ?Non-Invasive Test Results: High risk coronary CTA with CTFFR positive 3-vessel coronary artery disease. ? ?Prior CABG: No previous CABG ? ?Raekwon Winkowski ? ? ?

## 2021-10-06 ENCOUNTER — Encounter (HOSPITAL_COMMUNITY): Payer: Self-pay | Admitting: Internal Medicine

## 2021-10-13 ENCOUNTER — Other Ambulatory Visit: Payer: Self-pay | Admitting: *Deleted

## 2021-10-13 ENCOUNTER — Encounter: Payer: Self-pay | Admitting: *Deleted

## 2021-10-13 ENCOUNTER — Institutional Professional Consult (permissible substitution): Payer: HMO | Admitting: Thoracic Surgery (Cardiothoracic Vascular Surgery)

## 2021-10-13 VITALS — BP 150/70 | HR 71 | Resp 20 | Ht 70.0 in | Wt 172.0 lb

## 2021-10-13 DIAGNOSIS — I350 Nonrheumatic aortic (valve) stenosis: Secondary | ICD-10-CM

## 2021-10-13 DIAGNOSIS — I2510 Atherosclerotic heart disease of native coronary artery without angina pectoris: Secondary | ICD-10-CM

## 2021-10-13 NOTE — H&P (View-Only) (Signed)
[address]       [place] [address]             [phone]        Lewayne J Teng [place] Medical Record #[id number] Date of Birth: [birth year]  Referring: Nelva Bush, MD Primary Care: Alroy Dust, Carlean Jews.[name], MD Primary Cardiologist:Heather Renae Fickle, MD  Chief Complaint:    Chief Complaint  Patient presents with   Coronary Artery Disease    Surgical consult, Cardiac Cath 10/05/21, ECHO 09/12/21    History of Present Illness:     71 year old male referred for surgical evaluation of left main/three-vessel coronary artery disease, and mild to moderate aortic stenosis.  He does have a significant vascular history in regards to having strokes and TIAs in the past, and peripheral lower extremity revascularization.  Regards to his cardiac symptoms he admits to occasional chest pain and shortness of breath that occasionally occur at rest.  He also admits to some lower extremity swelling.  Past Medical and Surgical History: Previous Chest Surgery: No Previous Chest Radiation: No Diabetes Mellitus: Yes.  HbA1C 9.7 Creatinine: 1.1  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/2009   Dysphagia    Esophageal dysmotility    GERD (gastroesophageal reflux disease)    Heart murmur    History of hiatal hernia    History of Holter monitoring 2009   History of kidney stones    HNP (herniated nucleus pulposus), lumbar    L4-5   HYPERCHOLESTEROLEMIA 11/30/2009   Hyperlipidemia    HYPERTENSION 11/30/2009   Kidney stones    Numbness and tingling    Peripheral vascular disease ([place])    Right carotid bruit    SMOKER 11/30/2009   Stroke ([place]) 2012   URINARY CALCULUS 11/30/2009    Past Surgical History:  Procedure Laterality Date   ABDOMINAL AORTOGRAM W/LOWER EXTREMITY N/A 06/18/2018   Procedure:  ABDOMINAL AORTOGRAM W/LOWER EXTREMITY;  Surgeon: Marty Heck, MD;  Location: [facility];  Service: Cardiovascular;  Laterality: N/A;   ABDOMINAL AORTOGRAM W/LOWER EXTREMITY N/A 12/02/2019   Procedure: ABDOMINAL AORTOGRAM W/LOWER EXTREMITY;  Surgeon: Marty Heck, MD;  Location: [facility];  Service: Cardiovascular;  Laterality: N/A;   ABDOMINAL AORTOGRAM W/LOWER EXTREMITY N/A 03/23/2021   Procedure: ABDOMINAL AORTOGRAM W/LOWER EXTREMITY;  Surgeon: Marty Heck, MD;  Location: [facility];  Service: Cardiovascular;  Laterality: N/A;   arthroscopy of right shoulder Right    Jan 2019, June 2019-- done at [facility], Dr. Ninfa Linden   BACK SURGERY     CATARACT EXTRACTION W/ INTRAOCULAR LENS  IMPLANT, BILATERAL     CYSTOSCOPY     ENDARTERECTOMY Right 08/24/2019   Procedure: ENDARTERECTOMY CAROTID;  Surgeon: Marty Heck, MD;  Location: [facility];  Service: Vascular;  Laterality: Right;   ENDARTERECTOMY FEMORAL Left 06/05/2021   Procedure: ENDARTERECTOMY FEMORAL WITH PROFUNDAPLASTY;  Surgeon: Marty Heck, MD;  Location: [facility];  Service: Vascular;  Laterality: Left;   FACIAL COSMETIC SURGERY  2014   FEMORAL-POPLITEAL BYPASS GRAFT Left 06/05/2021   Procedure: LEFT FEMORAL-POPLITEAL ARTERY BYPASS GRAFTING USING 52m PROPATEN VASCULAR REMOVABLE RING GRAFT;  Surgeon: CMarty Heck MD;  Location[facility]  Service: Vascular;  Laterality:  Left;   FINGER SURGERY  2002   LEFT HEART CATH AND CORONARY ANGIOGRAPHY N/A 10/05/2021   Procedure: LEFT HEART CATH AND CORONARY ANGIOGRAPHY;  Surgeon: Nelva Bush, MD;  Location: [facility];  Service: Cardiovascular;  Laterality: N/A;   LUMBAR LAMINECTOMY/DECOMPRESSION MICRODISCECTOMY Left 08/01/2018   Procedure: Microdiscectomy - Lumbar four-Lumbar five - left;  Surgeon: Eustace Moore, MD;  Location: [facility];  Service: Neurosurgery;  Laterality: Left;   LUMBAR LAMINECTOMY/DECOMPRESSION  MICRODISCECTOMY Left 10/15/2018   Procedure: Re-do Microdiscectomy - left - Lumbar four-Lumbar five;  Surgeon: Eustace Moore, MD;  Location: [facility];  Service: Neurosurgery;  Laterality: Left;   NECK SURGERY  1986   PATCH ANGIOPLASTY Left 06/05/2021   Procedure: PATCH ANGIOPLASTY USING Rueben Bash BIOLOGIC PATCH;  Surgeon: Marty Heck, MD;  Location: [facility];  Service: Vascular;  Laterality: Left;   PERIPHERAL VASCULAR ATHERECTOMY  12/02/2019   Procedure: PERIPHERAL VASCULAR ATHERECTOMY;  Surgeon: Marty Heck, MD;  Location: [facility];  Service: Cardiovascular;;  left SFA   PERIPHERAL VASCULAR INTERVENTION  06/18/2018   Procedure: PERIPHERAL VASCULAR INTERVENTION;  Surgeon: Marty Heck, MD;  Location: [facility];  Service: Cardiovascular;;  Left external iliac   ROTATOR CUFF REPAIR Left    UMBILICAL HERNIA REPAIR      Social History: Support: He lives with his wife who has early stages of dementia and his son.  Social History   Tobacco Use  Smoking Status Former   Years: 40.00   Types: Cigarettes   Quit date: 12/2015   Years since quitting: 5.8  Smokeless Tobacco Never  Tobacco Comments   Quit 12-2012    Social History   Substance and Sexual Activity  Alcohol Use No     Allergies  Allergen Reactions   Codeine Nausea Only     Current Outpatient Medications  Medication Sig Dispense Refill   amLODipine-benazepril (LOTREL) 10-40 MG per capsule Take 1 capsule by mouth daily.     aspirin EC 81 MG EC tablet Take 1 tablet (81 mg total) by mouth daily at 6 (six) AM.     carvedilol (COREG) 12.5 MG tablet Take 1 tablet (12.5 mg total) by mouth 2 (two) times daily. 180 tablet 2   clopidogrel (PLAVIX) 75 MG tablet TAKE 1 TABLET BY MOUTH EVERY DAY 90 tablet 2   Continuous Blood Gluc Sensor (FREESTYLE LIBRE 2 SENSOR) MISC AS DIRECTED FOR BLOOD SUGAR CHECK DX E11.42     ezetimibe (ZETIA) 10 MG tablet Take 1 tablet (10 mg total) by mouth daily. 90 tablet  3   ferrous sulfate 325 (65 FE) MG tablet Take 325 mg by mouth daily with breakfast.     insulin degludec (TRESIBA) 200 UNIT/ML FlexTouch Pen Inject 60 Units into the skin in the morning.     metFORMIN (GLUCOPHAGE-XR) 500 MG 24 hr tablet Take 2 tablets (1,000 mg total) by mouth 2 (two) times daily.     nitroGLYCERIN (NITROSTAT) 0.4 MG SL tablet Place 1 tablet (0.4 mg total) under the tongue every 5 (five) minutes as needed for chest pain. 25 tablet PRN   ONETOUCH VERIO test strip 1 each 3 (three) times daily.     pantoprazole (PROTONIX) 40 MG tablet Take 40 mg by mouth daily with breakfast.      pioglitazone (ACTOS) 45 MG tablet TAKE 1 TABLET DAILY (NEED APPOINTMENT FOR FURTHER REFILLS) (Patient taking differently: Take 45 mg by mouth daily.) 90 tablet 0   rosuvastatin (CRESTOR)  40 MG tablet Take 40 mg by mouth daily.      No current facility-administered medications for this visit.    (Not in a hospital admission)   Family History  Problem Relation Age of Onset   Diabetes Sister    Diabetes Mellitus I Sister    Stroke Father    Heart disease Father    CVA Father    Coronary artery disease Father    Diabetes Mother    Heart disease Mother    Diabetes Mellitus I Mother    Coronary artery disease Mother    Coronary artery disease Brother    Heart disease Brother    Coronary artery disease Brother      Review of Systems:   Review of Systems  Constitutional:  Positive for malaise/fatigue.  Respiratory:  Positive for shortness of breath.   Cardiovascular:  Positive for chest pain and leg swelling.  Musculoskeletal:  Positive for joint pain and myalgias.  Neurological:  Positive for dizziness and loss of consciousness.     Physical Exam: BP (!) 150/70   Pulse 71   Resp 20   Ht '5\' 10"'$  (1.778 m)   Wt 172 lb (78 kg)   SpO2 99% Comment: RA  BMI 24.68 kg/m  Physical Exam Constitutional:      Appearance: He is normal weight. He is ill-appearing.  Cardiovascular:     Rate  and Rhythm: Normal rate.  Pulmonary:     Effort: Pulmonary effort is normal. No respiratory distress.  Abdominal:     General: Abdomen is flat. There is no distension.  Musculoskeletal:        General: Normal range of motion.     Cervical back: Normal range of motion.  Skin:    General: Skin is warm and dry.  Neurological:     General: No focal deficit present.     Mental Status: He is alert and oriented to person, place, and time.      Diagnostic Studies & Laboratory data:    Left Heart Catherization: Intervention  Echo: IMPRESSIONS     1. Mild to moderate AS (mean gradient 13 mmHg; AVA 1.5 cm2); gradient  slightly less on present study compared to 07/18/20.   2. Left ventricular ejection fraction, by estimation, is 60 to 65%. The  left ventricle has normal function. The left ventricle has no regional  wall motion abnormalities. Left ventricular diastolic parameters are  consistent with Grade I diastolic  dysfunction (impaired relaxation). The average left ventricular global  longitudinal strain is -20.9 %. The global longitudinal strain is normal.   3. Right ventricular systolic function is normal. The right ventricular  size is normal. Tricuspid regurgitation signal is inadequate for assessing  PA pressure.   4. The mitral valve is normal in structure. Trivial mitral valve  regurgitation. No evidence of mitral stenosis.   5. The aortic valve is tricuspid. Aortic valve regurgitation is not  visualized. Mild to moderate aortic valve stenosis.   6. The inferior vena cava is normal in size with greater than 50%  respiratory variability, suggesting right atrial pressure of 3 mmHg.   EKG: Sinus I have independently reviewed the above radiologic studies and discussed with the patient   Recent Lab Findings: Lab Results  Component Value Date   WBC 5.5 10/03/2021   HGB 10.3 (L) 10/03/2021   HCT 31.5 (L) 10/03/2021   PLT 294 10/03/2021   GLUCOSE 306 (H) 10/03/2021   CHOL  94 06/06/2021   TRIG  59 06/06/2021   HDL 39 (L) 06/06/2021   LDLCALC 43 06/06/2021   ALT 18 05/24/2021   AST 16 05/24/2021   NA 136 10/03/2021   K 4.6 10/03/2021   CL 104 10/03/2021   CREATININE 1.11 10/03/2021   BUN 18 10/03/2021   CO2 23 10/03/2021   INR 1.0 05/24/2021   HGBA1C 9.7 (H) 06/05/2021      Assessment / Plan:   71 year old male with left main/three-vessel coronary artery disease, and mild to moderate aortic stenosis.  He has previously undergone a left lower extremity femoropopliteal bypass with PTFE.  He also has a history of strokes and TIAs.  We discussed the risks and benefits of surgical revascularization along with possible bioprosthetic aortic valve replacement.  He will need dental clearance prior to surgery, along with carotid duplex and vein mapping.  He is currently on Plavix and this will be held for 5 days prior to surgery.     I  spent 40 minutes counseling the patient face to face.   Lajuana Matte 10/13/2021 11:29 AM

## 2021-10-13 NOTE — Progress Notes (Signed)
[address]       [place] [address]             [phone]        Morey J Obara  Medical Record #[id number] Date of Birth: 1951/04/10  Referring: Nelva Bush, MD Primary Care: Alroy Dust, Carlean Jews.[name], MD Primary Cardiologist:Heather Renae Fickle, MD  Chief Complaint:    Chief Complaint  Patient presents with   Coronary Artery Disease    Surgical consult, Cardiac Cath 10/05/21, ECHO 09/12/21    History of Present Illness:     71 year old male referred for surgical evaluation of left main/three-vessel coronary artery disease, and mild to moderate aortic stenosis.  He does have a significant vascular history in regards to having strokes and TIAs in the past, and peripheral lower extremity revascularization.  Regards to his cardiac symptoms he admits to occasional chest pain and shortness of breath that occasionally occur at rest.  He also admits to some lower extremity swelling.  Past Medical and Surgical History: Previous Chest Surgery: No Previous Chest Radiation: No Diabetes Mellitus: Yes.  HbA1C 9.7 Creatinine: 1.1  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/2009   Dysphagia    Esophageal dysmotility    GERD (gastroesophageal reflux disease)    Heart murmur    History of hiatal hernia    History of Holter monitoring 2009   History of kidney stones    HNP (herniated nucleus pulposus), lumbar    L4-5   HYPERCHOLESTEROLEMIA 11/30/2009   Hyperlipidemia    HYPERTENSION 11/30/2009   Kidney stones    Numbness and tingling    Peripheral vascular disease ([place])    Right carotid bruit    SMOKER 11/30/2009   Stroke ([place]) 2012   URINARY CALCULUS 11/30/2009    Past Surgical History:  Procedure Laterality Date   ABDOMINAL AORTOGRAM W/LOWER EXTREMITY N/A 06/18/2018   Procedure:  ABDOMINAL AORTOGRAM W/LOWER EXTREMITY;  Surgeon: Marty Heck, MD;  Location: [facility];  Service: Cardiovascular;  Laterality: N/A;   ABDOMINAL AORTOGRAM W/LOWER EXTREMITY N/A 12/02/2019   Procedure: ABDOMINAL AORTOGRAM W/LOWER EXTREMITY;  Surgeon: Marty Heck, MD;  Location: [facility];  Service: Cardiovascular;  Laterality: N/A;   ABDOMINAL AORTOGRAM W/LOWER EXTREMITY N/A 03/23/2021   Procedure: ABDOMINAL AORTOGRAM W/LOWER EXTREMITY;  Surgeon: Marty Heck, MD;  Location: [facility];  Service: Cardiovascular;  Laterality: N/A;   arthroscopy of right shoulder Right    Jan 2019, June 2019-- done at [facility], Dr. Ninfa Linden   BACK SURGERY     CATARACT EXTRACTION W/ INTRAOCULAR LENS  IMPLANT, BILATERAL     CYSTOSCOPY     ENDARTERECTOMY Right 08/24/2019   Procedure: ENDARTERECTOMY CAROTID;  Surgeon: Marty Heck, MD;  Location: [facility];  Service: Vascular;  Laterality: Right;   ENDARTERECTOMY FEMORAL Left 06/05/2021   Procedure: ENDARTERECTOMY FEMORAL WITH PROFUNDAPLASTY;  Surgeon: Marty Heck, MD;  Location: [facility];  Service: Vascular;  Laterality: Left;   FACIAL COSMETIC SURGERY  2014   FEMORAL-POPLITEAL BYPASS GRAFT Left 06/05/2021   Procedure: LEFT FEMORAL-POPLITEAL ARTERY BYPASS GRAFTING USING 61m PROPATEN VASCULAR REMOVABLE RING GRAFT;  Surgeon: CMarty Heck MD;  [facility]  Service: Vascular;  Laterality:  Left;   FINGER SURGERY  2002   LEFT HEART CATH AND CORONARY ANGIOGRAPHY N/A 10/05/2021   Procedure: LEFT HEART CATH AND CORONARY ANGIOGRAPHY;  Surgeon: Nelva Bush, MD;  Location: [facility];  Service: Cardiovascular;  Laterality: N/A;   LUMBAR LAMINECTOMY/DECOMPRESSION MICRODISCECTOMY Left 08/01/2018   Procedure: Microdiscectomy - Lumbar four-Lumbar five - left;  Surgeon: Eustace Moore, MD;  Location: [facility];  Service: Neurosurgery;  Laterality: Left;   LUMBAR LAMINECTOMY/DECOMPRESSION  MICRODISCECTOMY Left 10/15/2018   Procedure: Re-do Microdiscectomy - left - Lumbar four-Lumbar five;  Surgeon: Eustace Moore, MD;  Location: [facility];  Service: Neurosurgery;  Laterality: Left;   NECK SURGERY  1986   PATCH ANGIOPLASTY Left 06/05/2021   Procedure: PATCH ANGIOPLASTY USING Rueben Bash BIOLOGIC PATCH;  Surgeon: Marty Heck, MD;  Location: [facility];  Service: Vascular;  Laterality: Left;   PERIPHERAL VASCULAR ATHERECTOMY  12/02/2019   Procedure: PERIPHERAL VASCULAR ATHERECTOMY;  Surgeon: Marty Heck, MD;  Location: [facility];  Service: Cardiovascular;;  left SFA   PERIPHERAL VASCULAR INTERVENTION  06/18/2018   Procedure: PERIPHERAL VASCULAR INTERVENTION;  Surgeon: Marty Heck, MD;  Location: [facility];  Service: Cardiovascular;;  Left external iliac   ROTATOR CUFF REPAIR Left    UMBILICAL HERNIA REPAIR      Social History: Support: He lives with his wife who has early stages of dementia and his son.  Social History   Tobacco Use  Smoking Status Former   Years: 40.00   Types: Cigarettes   Quit date: 12/2015   Years since quitting: 5.8  Smokeless Tobacco Never  Tobacco Comments   Quit 12-2012    Social History   Substance and Sexual Activity  Alcohol Use No     Allergies  Allergen Reactions   Codeine Nausea Only     Current Outpatient Medications  Medication Sig Dispense Refill   amLODipine-benazepril (LOTREL) 10-40 MG per capsule Take 1 capsule by mouth daily.     aspirin EC 81 MG EC tablet Take 1 tablet (81 mg total) by mouth daily at 6 (six) AM.     carvedilol (COREG) 12.5 MG tablet Take 1 tablet (12.5 mg total) by mouth 2 (two) times daily. 180 tablet 2   clopidogrel (PLAVIX) 75 MG tablet TAKE 1 TABLET BY MOUTH EVERY DAY 90 tablet 2   Continuous Blood Gluc Sensor (FREESTYLE LIBRE 2 SENSOR) MISC AS DIRECTED FOR BLOOD SUGAR CHECK DX E11.42     ezetimibe (ZETIA) 10 MG tablet Take 1 tablet (10 mg total) by mouth daily. 90 tablet  3   ferrous sulfate 325 (65 FE) MG tablet Take 325 mg by mouth daily with breakfast.     insulin degludec (TRESIBA) 200 UNIT/ML FlexTouch Pen Inject 60 Units into the skin in the morning.     metFORMIN (GLUCOPHAGE-XR) 500 MG 24 hr tablet Take 2 tablets (1,000 mg total) by mouth 2 (two) times daily.     nitroGLYCERIN (NITROSTAT) 0.4 MG SL tablet Place 1 tablet (0.4 mg total) under the tongue every 5 (five) minutes as needed for chest pain. 25 tablet PRN   ONETOUCH VERIO test strip 1 each 3 (three) times daily.     pantoprazole (PROTONIX) 40 MG tablet Take 40 mg by mouth daily with breakfast.      pioglitazone (ACTOS) 45 MG tablet TAKE 1 TABLET DAILY (NEED APPOINTMENT FOR FURTHER REFILLS) (Patient taking differently: Take 45 mg by mouth daily.) 90 tablet 0   rosuvastatin (CRESTOR)  40 MG tablet Take 40 mg by mouth daily.      No current facility-administered medications for this visit.    (Not in a hospital admission)   Family History  Problem Relation Age of Onset   Diabetes Sister    Diabetes Mellitus I Sister    Stroke Father    Heart disease Father    CVA Father    Coronary artery disease Father    Diabetes Mother    Heart disease Mother    Diabetes Mellitus I Mother    Coronary artery disease Mother    Coronary artery disease Brother    Heart disease Brother    Coronary artery disease Brother      Review of Systems:   Review of Systems  Constitutional:  Positive for malaise/fatigue.  Respiratory:  Positive for shortness of breath.   Cardiovascular:  Positive for chest pain and leg swelling.  Musculoskeletal:  Positive for joint pain and myalgias.  Neurological:  Positive for dizziness and loss of consciousness.     Physical Exam: BP (!) 150/70   Pulse 71   Resp 20   Ht '5\' 10"'$  (1.778 m)   Wt 172 lb (78 kg)   SpO2 99% Comment: RA  BMI 24.68 kg/m  Physical Exam Constitutional:      Appearance: He is normal weight. He is ill-appearing.  Cardiovascular:     Rate  and Rhythm: Normal rate.  Pulmonary:     Effort: Pulmonary effort is normal. No respiratory distress.  Abdominal:     General: Abdomen is flat. There is no distension.  Musculoskeletal:        General: Normal range of motion.     Cervical back: Normal range of motion.  Skin:    General: Skin is warm and dry.  Neurological:     General: No focal deficit present.     Mental Status: He is alert and oriented to person, place, and time.      Diagnostic Studies & Laboratory data:    Left Heart Catherization: Intervention  Echo: IMPRESSIONS     1. Mild to moderate AS (mean gradient 13 mmHg; AVA 1.5 cm2); gradient  slightly less on present study compared to 07/18/20.   2. Left ventricular ejection fraction, by estimation, is 60 to 65%. The  left ventricle has normal function. The left ventricle has no regional  wall motion abnormalities. Left ventricular diastolic parameters are  consistent with Grade I diastolic  dysfunction (impaired relaxation). The average left ventricular global  longitudinal strain is -20.9 %. The global longitudinal strain is normal.   3. Right ventricular systolic function is normal. The right ventricular  size is normal. Tricuspid regurgitation signal is inadequate for assessing  PA pressure.   4. The mitral valve is normal in structure. Trivial mitral valve  regurgitation. No evidence of mitral stenosis.   5. The aortic valve is tricuspid. Aortic valve regurgitation is not  visualized. Mild to moderate aortic valve stenosis.   6. The inferior vena cava is normal in size with greater than 50%  respiratory variability, suggesting right atrial pressure of 3 mmHg.   EKG: Sinus I have independently reviewed the above radiologic studies and discussed with the patient   Recent Lab Findings: Lab Results  Component Value Date   WBC 5.5 10/03/2021   HGB 10.3 (L) 10/03/2021   HCT 31.5 (L) 10/03/2021   PLT 294 10/03/2021   GLUCOSE 306 (H) 10/03/2021   CHOL  94 06/06/2021   TRIG  59 06/06/2021   HDL 39 (L) 06/06/2021   LDLCALC 43 06/06/2021   ALT 18 05/24/2021   AST 16 05/24/2021   NA 136 10/03/2021   K 4.6 10/03/2021   CL 104 10/03/2021   CREATININE 1.11 10/03/2021   BUN 18 10/03/2021   CO2 23 10/03/2021   INR 1.0 05/24/2021   HGBA1C 9.7 (H) 06/05/2021      Assessment / Plan:   71 year old male with left main/three-vessel coronary artery disease, and mild to moderate aortic stenosis.  He has previously undergone a left lower extremity femoropopliteal bypass with PTFE.  He also has a history of strokes and TIAs.  We discussed the risks and benefits of surgical revascularization along with possible bioprosthetic aortic valve replacement.  He will need dental clearance prior to surgery, along with carotid duplex and vein mapping.  He is currently on Plavix and this will be held for 5 days prior to surgery.     I  spent 40 minutes counseling the patient face to face.   Lajuana Matte 10/13/2021 11:29 AM

## 2021-10-19 ENCOUNTER — Ambulatory Visit: Payer: HMO | Admitting: Physician Assistant

## 2021-10-19 NOTE — Progress Notes (Unsigned)
Cardiology Office Note:    Date:  10/19/2021   ID:  Luis Lindsey, DOB 21-Sep-1950, MRN [id number]  PCP:  Aurea Graff.[name], MD  [place] Cardiologist:  Freada Bergeron, MD  [facility] Electrophysiologist:  None   Chief Complaint: cath follow up   History of Present Illness:    Luis Lindsey is a 71 y.o. male with a hx of ***  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/2009   Dysphagia    Esophageal dysmotility    GERD (gastroesophageal reflux disease)    Heart murmur    History of hiatal hernia    History of Holter monitoring 2009   History of kidney stones    HNP (herniated nucleus pulposus), lumbar    L4-5   HYPERCHOLESTEROLEMIA 11/30/2009   Hyperlipidemia    HYPERTENSION 11/30/2009   Kidney stones    Numbness and tingling    Peripheral vascular disease ([place])    Right carotid bruit    SMOKER 11/30/2009   Stroke ([place]) 2012   URINARY CALCULUS 11/30/2009    Past Surgical History:  Procedure Laterality Date   ABDOMINAL AORTOGRAM W/LOWER EXTREMITY N/A 06/18/2018   Procedure: ABDOMINAL AORTOGRAM W/LOWER EXTREMITY;  Surgeon: Marty Heck, MD;  Location: [place] [facility];  Service: Cardiovascular;  Laterality: N/A;   ABDOMINAL AORTOGRAM W/LOWER EXTREMITY N/A 12/02/2019   Procedure: ABDOMINAL AORTOGRAM W/LOWER EXTREMITY;  Surgeon: Marty Heck, MD;  Location: [place] [facility];  Service: Cardiovascular;  Laterality: N/A;   ABDOMINAL AORTOGRAM W/LOWER EXTREMITY N/A 03/23/2021   Procedure: ABDOMINAL AORTOGRAM W/LOWER EXTREMITY;  Surgeon: Marty Heck, MD;  Location: [place] [facility];  Service: Cardiovascular;  Laterality: N/A;   arthroscopy of right shoulder Right    Jan 2019, June 2019-- done at [place], Dr. Ninfa Linden   BACK SURGERY     CATARACT  EXTRACTION W/ INTRAOCULAR LENS  IMPLANT, BILATERAL     CYSTOSCOPY     ENDARTERECTOMY Right 08/24/2019   Procedure: ENDARTERECTOMY CAROTID;  Surgeon: Marty Heck, MD;  Location: [place];  Service: Vascular;  Laterality: Right;   ENDARTERECTOMY FEMORAL Left 06/05/2021   Procedure: ENDARTERECTOMY FEMORAL WITH PROFUNDAPLASTY;  Surgeon: Marty Heck, MD;  Location: [place];  Service: Vascular;  Laterality: Left;   FACIAL COSMETIC SURGERY  2014   FEMORAL-POPLITEAL BYPASS GRAFT Left 06/05/2021   Procedure: LEFT FEMORAL-POPLITEAL ARTERY BYPASS GRAFTING USING 34m PROPATEN VASCULAR REMOVABLE RING GRAFT;  Surgeon: CMarty Heck MD;  [place]  Service: Vascular;  Laterality: Left;   FINGER SURGERY  2002   LEFT HEART CATH AND CORONARY ANGIOGRAPHY N/A 10/05/2021   Procedure: LEFT HEART CATH AND CORONARY ANGIOGRAPHY;  Surgeon: ENelva Bush MD;  [facility];  Service: Cardiovascular;  Laterality: N/A;   LUMBAR LAMINECTOMY/DECOMPRESSION MICRODISCECTOMY Left 08/01/2018   Procedure: Microdiscectomy - Lumbar four-Lumbar five - left;  Surgeon: JEustace Moore MD;  [place] [facility]  Service: Neurosurgery;  Laterality: Left;   LUMBAR LAMINECTOMY/DECOMPRESSION MICRODISCECTOMY Left 10/15/2018   Procedure: Re-do Microdiscectomy - left - Lumbar four-Lumbar five;  Surgeon: JEustace Moore MD;  [place] [facility]  Service: Neurosurgery;  Laterality: Left;   NECK SURGERY  1986   PATCH ANGIOPLASTY Left 06/05/2021   Procedure:  PATCH ANGIOPLASTY USING XENOSURE BIOLOGIC PATCH;  Surgeon: Marty Heck, MD;  Location: [place];  Service: Vascular;  Laterality: Left;   PERIPHERAL VASCULAR ATHERECTOMY  12/02/2019   Procedure: PERIPHERAL VASCULAR ATHERECTOMY;  Surgeon: Marty Heck, MD;  Location: [place] [facility];  Service: Cardiovascular;;  left SFA   PERIPHERAL VASCULAR INTERVENTION  06/18/2018   Procedure: PERIPHERAL VASCULAR INTERVENTION;  Surgeon: Marty Heck, MD;   Location: [place] [facility];  Service: Cardiovascular;;  Left external iliac   ROTATOR CUFF REPAIR Left    UMBILICAL HERNIA REPAIR      Current Medications: No outpatient medications have been marked as taking for the 10/19/21 encounter (Appointment) with Leanor Kail, PA.     Allergies:   Codeine   Social History   Socioeconomic History   Marital status: Married    Spouse name: Luis Lindsey    Number of children: 3   Years of education: 11   Highest education level: Not on file  Occupational History   Occupation: Truck Education administrator: [employer]  Tobacco Use   Smoking status: Former    Years: 40.00    Types: Cigarettes    Quit date: 12/2015    Years since quitting: 5.8   Smokeless tobacco: Never   Tobacco comments:    Quit 12-2012  Vaping Use   Vaping Use: Never used  Substance and Sexual Activity   Alcohol use: No   Drug use: No   Sexual activity: Not on file  Other Topics Concern   Not on file  Social History Narrative   . Patient drinks 2-3 cups of caffeine daily.    Patient lives at home with his wife Luis Lindsey.    Patient works at [employer].    Education. 12 th grade   Right handed               Social Determinants of Health   Financial Resource Strain: Not on file  Food Insecurity: Not on file  Transportation Needs: Not on file  Physical Activity: Not on file  Stress: Not on file  Social Connections: Not on file     Family History: The patient's family history includes CVA in his father; Coronary artery disease in his brother, brother, father, and mother; Diabetes in his mother and sister; Diabetes Mellitus I in his mother and sister; Heart disease in his brother, father, and mother; Stroke in his father.  ***  ROS:   Please see the history of present illness.    All other systems reviewed and are negative. ***  EKGs/Labs/Other Studies Reviewed:    The following studies were reviewed today: ***  EKG:  EKG is *** ordered today.  The ekg ordered  today demonstrates ***  Recent Labs: 05/24/2021: ALT 18 10/03/2021: BUN 18; Creatinine, Ser 1.11; Hemoglobin 10.3; Platelets 294; Potassium 4.6; Sodium 136  Recent Lipid Panel    Component Value Date/Time   CHOL 94 06/06/2021 0329   TRIG 59 06/06/2021 0329   HDL 39 (L) 06/06/2021 0329   CHOLHDL 2.4 06/06/2021 0329   VLDL 12 06/06/2021 0329   LDLCALC 43 06/06/2021 0329     Risk Assessment/Calculations:   {Does this patient have ATRIAL FIBRILLATION?[phone]}   Physical Exam:    VS:  There were no vitals taken for this visit.    Wt Readings from Last 3 Encounters:  10/13/21 172 lb (78 kg)  10/05/21 172 lb (78 kg)  10/03/21 172 lb (78 kg)  GEN: *** Well nourished, well developed in no acute distress HEENT: Normal NECK: No JVD; No carotid bruits LYMPHATICS: No lymphadenopathy CARDIAC: ***RRR, no murmurs, rubs, gallops RESPIRATORY:  Clear to auscultation without rales, wheezing or rhonchi  ABDOMEN: Soft, non-tender, non-distended MUSCULOSKELETAL:  No edema; No deformity  SKIN: Warm and dry NEUROLOGIC:  Alert and oriented x 3 PSYCHIATRIC:  Normal affect   ASSESSMENT AND PLAN:    ***  2. ***  Medication Adjustments/Labs and Tests Ordered: Current medicines are reviewed at length with the patient today.  Concerns regarding medicines are outlined above.  No orders of the defined types were placed in this encounter.  No orders of the defined types were placed in this encounter.   There are no Patient Instructions on file for this visit.   Jarrett Soho, [place]  10/19/2021 11:22 AM    [place]

## 2021-10-23 ENCOUNTER — Other Ambulatory Visit (HOSPITAL_COMMUNITY): Payer: HMO

## 2021-10-23 ENCOUNTER — Encounter (HOSPITAL_COMMUNITY): Payer: HMO

## 2021-10-24 ENCOUNTER — Encounter (HOSPITAL_COMMUNITY)
Admission: RE | Admit: 2021-10-24 | Discharge: 2021-10-24 | Disposition: A | Discharge status: Home / Self Care | Payer: HMO | Source: Ambulatory Visit | Attending: Thoracic Surgery (Cardiothoracic Vascular Surgery) | Admitting: Thoracic Surgery (Cardiothoracic Vascular Surgery)

## 2021-10-24 ENCOUNTER — Ambulatory Visit (HOSPITAL_COMMUNITY)
Admission: RE | Admit: 2021-10-24 | Discharge: 2021-10-24 | Disposition: A | Discharge status: Home / Self Care | Payer: HMO | Source: Ambulatory Visit | Attending: Thoracic Surgery (Cardiothoracic Vascular Surgery) | Admitting: Thoracic Surgery (Cardiothoracic Vascular Surgery)

## 2021-10-24 ENCOUNTER — Ambulatory Visit (HOSPITAL_BASED_OUTPATIENT_CLINIC_OR_DEPARTMENT_OTHER)
Admission: RE | Admit: 2021-10-24 | Discharge: 2021-10-24 | Disposition: A | Discharge status: Home / Self Care | Payer: HMO | Source: Ambulatory Visit | Attending: Thoracic Surgery (Cardiothoracic Vascular Surgery) | Admitting: Thoracic Surgery (Cardiothoracic Vascular Surgery)

## 2021-10-24 ENCOUNTER — Encounter (HOSPITAL_COMMUNITY): Payer: Self-pay

## 2021-10-24 ENCOUNTER — Other Ambulatory Visit: Payer: Self-pay

## 2021-10-24 VITALS — BP 139/58 | HR 63 | Temp 98.0°F | Resp 18 | Ht 70.0 in | Wt 167.6 lb

## 2021-10-24 DIAGNOSIS — R0689 Other abnormalities of breathing: Secondary | ICD-10-CM | POA: Diagnosis not present

## 2021-10-24 DIAGNOSIS — I442 Atrioventricular block, complete: Secondary | ICD-10-CM | POA: Diagnosis not present

## 2021-10-24 DIAGNOSIS — I2510 Atherosclerotic heart disease of native coronary artery without angina pectoris: Secondary | ICD-10-CM

## 2021-10-24 DIAGNOSIS — E11649 Type 2 diabetes mellitus with hypoglycemia without coma: Secondary | ICD-10-CM | POA: Diagnosis not present

## 2021-10-24 DIAGNOSIS — Z951 Presence of aortocoronary bypass graft: Secondary | ICD-10-CM | POA: Diagnosis not present

## 2021-10-24 DIAGNOSIS — I350 Nonrheumatic aortic (valve) stenosis: Secondary | ICD-10-CM

## 2021-10-24 DIAGNOSIS — Z95820 Peripheral vascular angioplasty status with implants and grafts: Secondary | ICD-10-CM | POA: Insufficient documentation

## 2021-10-24 DIAGNOSIS — Z87891 Personal history of nicotine dependence: Secondary | ICD-10-CM | POA: Insufficient documentation

## 2021-10-24 DIAGNOSIS — K219 Gastro-esophageal reflux disease without esophagitis: Secondary | ICD-10-CM | POA: Diagnosis present

## 2021-10-24 DIAGNOSIS — I700 Atherosclerosis of aorta: Secondary | ICD-10-CM | POA: Diagnosis not present

## 2021-10-24 DIAGNOSIS — I25119 Atherosclerotic heart disease of native coronary artery with unspecified angina pectoris: Secondary | ICD-10-CM | POA: Diagnosis not present

## 2021-10-24 DIAGNOSIS — R079 Chest pain, unspecified: Secondary | ICD-10-CM | POA: Diagnosis not present

## 2021-10-24 DIAGNOSIS — D62 Acute posthemorrhagic anemia: Secondary | ICD-10-CM | POA: Diagnosis not present

## 2021-10-24 DIAGNOSIS — Z8673 Personal history of transient ischemic attack (TIA), and cerebral infarction without residual deficits: Secondary | ICD-10-CM | POA: Diagnosis not present

## 2021-10-24 DIAGNOSIS — M1990 Unspecified osteoarthritis, unspecified site: Secondary | ICD-10-CM | POA: Diagnosis present

## 2021-10-24 DIAGNOSIS — N400 Enlarged prostate without lower urinary tract symptoms: Secondary | ICD-10-CM | POA: Diagnosis present

## 2021-10-24 DIAGNOSIS — J9811 Atelectasis: Secondary | ICD-10-CM | POA: Diagnosis not present

## 2021-10-24 DIAGNOSIS — Z7982 Long term (current) use of aspirin: Secondary | ICD-10-CM | POA: Diagnosis not present

## 2021-10-24 DIAGNOSIS — I358 Other nonrheumatic aortic valve disorders: Secondary | ICD-10-CM | POA: Diagnosis not present

## 2021-10-24 DIAGNOSIS — E785 Hyperlipidemia, unspecified: Secondary | ICD-10-CM | POA: Diagnosis not present

## 2021-10-24 DIAGNOSIS — J939 Pneumothorax, unspecified: Secondary | ICD-10-CM | POA: Diagnosis not present

## 2021-10-24 DIAGNOSIS — E1151 Type 2 diabetes mellitus with diabetic peripheral angiopathy without gangrene: Secondary | ICD-10-CM | POA: Diagnosis present

## 2021-10-24 DIAGNOSIS — Z20822 Contact with and (suspected) exposure to covid-19: Secondary | ICD-10-CM | POA: Diagnosis present

## 2021-10-24 DIAGNOSIS — Z4682 Encounter for fitting and adjustment of non-vascular catheter: Secondary | ICD-10-CM | POA: Diagnosis not present

## 2021-10-24 DIAGNOSIS — K5900 Constipation, unspecified: Secondary | ICD-10-CM | POA: Diagnosis not present

## 2021-10-24 DIAGNOSIS — Z7902 Long term (current) use of antithrombotics/antiplatelets: Secondary | ICD-10-CM | POA: Diagnosis not present

## 2021-10-24 DIAGNOSIS — Z01818 Encounter for other preprocedural examination: Secondary | ICD-10-CM | POA: Diagnosis not present

## 2021-10-24 DIAGNOSIS — I6521 Occlusion and stenosis of right carotid artery: Secondary | ICD-10-CM | POA: Insufficient documentation

## 2021-10-24 DIAGNOSIS — E1142 Type 2 diabetes mellitus with diabetic polyneuropathy: Secondary | ICD-10-CM | POA: Diagnosis present

## 2021-10-24 DIAGNOSIS — J9589 Other postprocedural complications and disorders of respiratory system, not elsewhere classified: Secondary | ICD-10-CM | POA: Diagnosis not present

## 2021-10-24 DIAGNOSIS — I083 Combined rheumatic disorders of mitral, aortic and tricuspid valves: Secondary | ICD-10-CM | POA: Diagnosis not present

## 2021-10-24 DIAGNOSIS — F17210 Nicotine dependence, cigarettes, uncomplicated: Secondary | ICD-10-CM | POA: Diagnosis present

## 2021-10-24 DIAGNOSIS — I150 Renovascular hypertension: Secondary | ICD-10-CM | POA: Diagnosis not present

## 2021-10-24 DIAGNOSIS — I739 Peripheral vascular disease, unspecified: Secondary | ICD-10-CM | POA: Insufficient documentation

## 2021-10-24 DIAGNOSIS — I10 Essential (primary) hypertension: Secondary | ICD-10-CM | POA: Diagnosis present

## 2021-10-24 DIAGNOSIS — Z794 Long term (current) use of insulin: Secondary | ICD-10-CM | POA: Insufficient documentation

## 2021-10-24 DIAGNOSIS — Z952 Presence of prosthetic heart valve: Secondary | ICD-10-CM | POA: Diagnosis not present

## 2021-10-24 DIAGNOSIS — R0602 Shortness of breath: Secondary | ICD-10-CM | POA: Diagnosis not present

## 2021-10-24 DIAGNOSIS — E1165 Type 2 diabetes mellitus with hyperglycemia: Secondary | ICD-10-CM | POA: Diagnosis present

## 2021-10-24 DIAGNOSIS — Z961 Presence of intraocular lens: Secondary | ICD-10-CM | POA: Diagnosis present

## 2021-10-24 DIAGNOSIS — Z85828 Personal history of other malignant neoplasm of skin: Secondary | ICD-10-CM | POA: Diagnosis not present

## 2021-10-24 DIAGNOSIS — E8770 Fluid overload, unspecified: Secondary | ICD-10-CM | POA: Diagnosis not present

## 2021-10-24 DIAGNOSIS — E7800 Pure hypercholesterolemia, unspecified: Secondary | ICD-10-CM | POA: Diagnosis present

## 2021-10-24 LAB — BLOOD GAS, ARTERIAL
Acid-Base Excess: 0.1 mmol/L (ref 0.0–2.0)
Bicarbonate: 23.8 mmol/L (ref 20.0–28.0)
Drawn by: 60286
O2 Saturation: 99.5 %
Patient temperature: 37
pCO2 arterial: 35 mmHg (ref 32–48)
pH, Arterial: 7.44 (ref 7.35–7.45)
pO2, Arterial: 124 mmHg — ABNORMAL HIGH (ref 83–108)

## 2021-10-24 LAB — CBC
HCT: 33 % — ABNORMAL LOW (ref 39.0–52.0)
Hemoglobin: 10.2 g/dL — ABNORMAL LOW (ref 13.0–17.0)
MCH: 28.8 pg (ref 26.0–34.0)
MCHC: 30.9 g/dL (ref 30.0–36.0)
MCV: 93.2 fL (ref 80.0–100.0)
Platelets: 316 10*3/uL (ref 150–400)
RBC: 3.54 MIL/uL — ABNORMAL LOW (ref 4.22–5.81)
RDW: 15 % (ref 11.5–15.5)
WBC: 6.1 10*3/uL (ref 4.0–10.5)
nRBC: 0 % (ref 0.0–0.2)

## 2021-10-24 LAB — URINALYSIS, ROUTINE W REFLEX MICROSCOPIC
Bilirubin Urine: NEGATIVE
Glucose, UA: >=500 mg/dL — AB
Ketones, ur: NEGATIVE mg/dL
Leukocytes,Ua: NEGATIVE
Nitrite: NEGATIVE
Protein, ur: 100 mg/dL — AB
RBC / HPF: >50 RBC/hpf — ABNORMAL HIGH (ref 0–5)
Specific Gravity, Urine: 1.023 (ref 1.005–1.030)
pH: 5 (ref 5.0–8.0)

## 2021-10-24 LAB — COMPREHENSIVE METABOLIC PANEL
ALT: 16 U/L (ref 0–44)
AST: 15 U/L (ref 15–41)
Albumin: 3.3 g/dL — ABNORMAL LOW (ref 3.5–5.0)
Alkaline Phosphatase: 77 U/L (ref 38–126)
Anion gap: 9 (ref 5–15)
BUN: 20 mg/dL (ref 8–23)
CO2: 19 mmol/L — ABNORMAL LOW (ref 22–32)
Calcium: 9 mg/dL (ref 8.9–10.3)
Chloride: 106 mmol/L (ref 98–111)
Creatinine, Ser: 0.9 mg/dL (ref 0.61–1.24)
GFR, Estimated: >60 mL/min (ref >60)
Glucose, Bld: 237 mg/dL — ABNORMAL HIGH (ref 70–99)
Potassium: 4.1 mmol/L (ref 3.5–5.1)
Sodium: 134 mmol/L — ABNORMAL LOW (ref 135–145)
Total Bilirubin: 0.5 mg/dL (ref 0.3–1.2)
Total Protein: 6.7 g/dL (ref 6.5–8.1)

## 2021-10-24 LAB — PROTIME-INR
INR: 1.1 (ref 0.8–1.2)
Prothrombin Time: 13.8 seconds (ref 11.4–15.2)

## 2021-10-24 LAB — APTT: aPTT: 33 seconds (ref 24–36)

## 2021-10-24 LAB — HEMOGLOBIN A1C
Hgb A1c MFr Bld: 9.8 % — ABNORMAL HIGH (ref 4.8–5.6)
Mean Plasma Glucose: 234.56 mg/dL

## 2021-10-24 LAB — SURGICAL PCR SCREEN
MRSA, PCR: NEGATIVE
Staphylococcus aureus: NEGATIVE

## 2021-10-24 LAB — GLUCOSE, CAPILLARY: Glucose-Capillary: 270 mg/dL — ABNORMAL HIGH (ref 70–99)

## 2021-10-24 LAB — SARS CORONAVIRUS 2 (TAT 6-24 HRS): SARS Coronavirus 2: NEGATIVE

## 2021-10-24 MED ORDER — CEFAZOLIN SODIUM-DEXTROSE 2-4 GM/100ML-% IV SOLN
2.0000 g | INTRAVENOUS | Status: AC
  Administered 2021-10-25: 2 g via INTRAVENOUS
  Filled 2021-10-24: qty 100

## 2021-10-24 MED ORDER — POTASSIUM CHLORIDE 2 MEQ/ML IV SOLN
80.0000 meq | INTRAVENOUS | Status: DC
  Filled 2021-10-24: qty 40

## 2021-10-24 MED ORDER — TRANEXAMIC ACID 1000 MG/10ML IV SOLN
1.5000 mg/kg/h | INTRAVENOUS | Status: AC
  Administered 2021-10-25: 1.5 mg/kg/h via INTRAVENOUS
  Filled 2021-10-24: qty 25

## 2021-10-24 MED ORDER — HEPARIN 30,000 UNITS/1000 ML (OHS) CELLSAVER SOLUTION
Status: DC
  Filled 2021-10-24: qty 1000

## 2021-10-24 MED ORDER — INSULIN REGULAR(HUMAN) IN NACL 100-0.9 UT/100ML-% IV SOLN
INTRAVENOUS | Status: AC
  Administered 2021-10-25: .8 [IU]/h via INTRAVENOUS
  Filled 2021-10-24: qty 100

## 2021-10-24 MED ORDER — NITROGLYCERIN IN D5W 200-5 MCG/ML-% IV SOLN
2.0000 ug/min | INTRAVENOUS | Status: AC
  Administered 2021-10-25: 10 ug/min via INTRAVENOUS
  Filled 2021-10-24: qty 250

## 2021-10-24 MED ORDER — TRANEXAMIC ACID (OHS) BOLUS VIA INFUSION
15.0000 mg/kg | INTRAVENOUS | Status: AC
  Administered 2021-10-25: 1140 mg via INTRAVENOUS
  Filled 2021-10-24: qty 1140

## 2021-10-24 MED ORDER — VANCOMYCIN HCL 1250 MG/250ML IV SOLN
1250.0000 mg | INTRAVENOUS | Status: AC
  Administered 2021-10-25: 1250 mg via INTRAVENOUS
  Filled 2021-10-24: qty 250

## 2021-10-24 MED ORDER — EPINEPHRINE HCL 5 MG/250ML IV SOLN IN NS
0.0000 ug/min | INTRAVENOUS | Status: DC
  Filled 2021-10-24: qty 250

## 2021-10-24 MED ORDER — TRANEXAMIC ACID (OHS) PUMP PRIME SOLUTION
2.0000 mg/kg | INTRAVENOUS | Status: DC
  Filled 2021-10-24: qty 1.52

## 2021-10-24 MED ORDER — MILRINONE LACTATE IN DEXTROSE 20-5 MG/100ML-% IV SOLN
0.3000 ug/kg/min | INTRAVENOUS | Status: DC
  Filled 2021-10-24: qty 100

## 2021-10-24 MED ORDER — MANNITOL 20 % IV SOLN
INTRAVENOUS | Status: DC
  Filled 2021-10-24: qty 13

## 2021-10-24 MED ORDER — DEXMEDETOMIDINE HCL IN NACL 400 MCG/100ML IV SOLN
0.1000 ug/kg/h | INTRAVENOUS | Status: DC
  Filled 2021-10-24: qty 100

## 2021-10-24 MED ORDER — PHENYLEPHRINE HCL-NACL 20-0.9 MG/250ML-% IV SOLN
30.0000 ug/min | INTRAVENOUS | Status: AC
  Administered 2021-10-25: 20 ug/min via INTRAVENOUS
  Filled 2021-10-24: qty 250

## 2021-10-24 MED ORDER — PLASMA-LYTE A IV SOLN
INTRAVENOUS | Status: DC
  Filled 2021-10-24: qty 2.5

## 2021-10-24 MED ORDER — NOREPINEPHRINE 4 MG/250ML-% IV SOLN
0.0000 ug/min | INTRAVENOUS | Status: DC
  Filled 2021-10-24: qty 250

## 2021-10-24 NOTE — Progress Notes (Signed)
Pre-CABG Dopplers and lower extremity vein mapping completed. Refer to "CV Proc" under chart review to view preliminary results.  10/24/2021 10:26 AM Kelby Aline., MHA, RVT, RDCS, RDMS

## 2021-10-24 NOTE — Pre-Procedure Instructions (Signed)
Surgical Instructions    Your procedure is scheduled on Wednesday, May 31st.  Report to [facility] Main Entrance "A" at 05:30 A.M., then check in with the Admitting office.  Call this number if you have problems the morning of surgery:  [phone]   If you have any questions prior to your surgery date call [phone]: Open Monday-Friday 8am-4pm    Remember:  Do not eat or drink after midnight the night before your surgery     Take these medicines the morning of surgery with A SIP OF WATER  carvedilol (COREG) ezetimibe (ZETIA) pantoprazole (PROTONIX)  rosuvastatin (CRESTOR)  If needed: nitroGLYCERIN (NITROSTAT) 0   Hold Aspirin and Plavix 7 days prior to surgery, per order  As of today, STOP taking any Aleve, Naproxen, Ibuprofen, Motrin, Advil, Goody's, BC's, all herbal medications, fish oil, and all vitamins.   WHAT DO I DO ABOUT MY DIABETES MEDICATION?   Do not take metFORMIN (GLUCOPHAGE-XR) or pioglitazone (ACTOS) the morning of surgery.      THE MORNING OF SURGERY, take 33 units (50%) of insulin degludec (TRESIBA).     HOW TO MANAGE YOUR DIABETES BEFORE AND AFTER SURGERY  Why is it important to control my blood sugar before and after surgery? Improving blood sugar levels before and after surgery helps healing and can limit problems. A way of improving blood sugar control is eating a healthy diet by:  Eating less sugar and carbohydrates  Increasing activity/exercise  Talking with your doctor about reaching your blood sugar goals High blood sugars (greater than 180 mg/dL) can raise your risk of infections and slow your recovery, so you will need to focus on controlling your diabetes during the weeks before surgery. Make sure that the doctor who takes care of your diabetes knows about your planned surgery including the date and location.  How do I manage my blood sugar before surgery? Check your blood sugar at least 4 times a day, starting 2 days before  surgery, to make sure that the level is not too high or low.  Check your blood sugar the morning of your surgery when you wake up and every 2 hours until you get to the Short Stay unit.  If your blood sugar is less than 70 mg/dL, you will need to treat for low blood sugar: Do not take insulin. Treat a low blood sugar (less than 70 mg/dL) with  cup of clear juice (cranberry or apple), 4 glucose tablets, OR glucose gel. Recheck blood sugar in 15 minutes after treatment (to make sure it is greater than 70 mg/dL). If your blood sugar is not greater than 70 mg/dL on recheck, call [phone] for further instructions. Report your blood sugar to the short stay nurse when you get to Short Stay.  If you are admitted to the hospital after surgery: Your blood sugar will be checked by the staff and you will probably be given insulin after surgery (instead of oral diabetes medicines) to make sure you have good blood sugar levels. The goal for blood sugar control after surgery is 80-180 mg/dL.                     Do NOT Smoke (Tobacco/Vaping) for 24 hours prior to your procedure.  If you use a CPAP at night, you may bring your mask/headgear for your overnight stay.   Contacts, glasses, piercing's, hearing aid's, dentures or partials may not be worn into surgery, please bring cases for these belongings.  For patients admitted to the hospital, discharge time will be determined by your treatment team.   Patients discharged the day of surgery will not be allowed to drive home, and someone needs to stay with them for 24 hours.  SURGICAL WAITING ROOM VISITATION Patients having surgery or a procedure may have two support people in the waiting room. These visitors may be switched out with other visitors if needed. Children under the age of 33 must have an adult accompany them who is not the patient. If the patient needs to stay at the hospital during part of their recovery, the visitor guidelines for  inpatient rooms apply.  Please refer to the [facility] website for the visitor guidelines for Inpatients (after your surgery is over and you are in a regular room).    Special instructions:   [place]- Preparing For Surgery  Before surgery, you can play an important role. Because skin is not sterile, your skin needs to be as free of germs as possible. You can reduce the number of germs on your skin by washing with CHG (chlorahexidine gluconate) Soap before surgery.  CHG is an antiseptic cleaner which kills germs and bonds with the skin to continue killing germs even after washing.    Oral Hygiene is also important to reduce your risk of infection.  Remember - BRUSH YOUR TEETH THE MORNING OF SURGERY WITH YOUR REGULAR TOOTHPASTE  Please do not use if you have an allergy to CHG or antibacterial soaps. If your skin becomes reddened/irritated stop using the CHG.  Do not shave (including legs and underarms) for at least 48 hours prior to first CHG shower. It is OK to shave your face.  Please follow these instructions carefully.   Shower the NIGHT BEFORE SURGERY and the MORNING OF SURGERY  If you chose to wash your hair, wash your hair first as usual with your normal shampoo.  After you shampoo, rinse your hair and body thoroughly to remove the shampoo.  Use CHG Soap as you would any other liquid soap. You can apply CHG directly to the skin and wash gently with a scrungie or a clean washcloth.   Apply the CHG Soap to your body ONLY FROM THE NECK DOWN.  Do not use on open wounds or open sores. Avoid contact with your eyes, ears, mouth and genitals (private parts). Wash Face and genitals (private parts)  with your normal soap.   Wash thoroughly, paying special attention to the area where your surgery will be performed.  Thoroughly rinse your body with warm water from the neck down.  DO NOT shower/wash with your normal soap after using and rinsing off the CHG Soap.  Pat yourself dry with a  CLEAN TOWEL.  Wear CLEAN PAJAMAS to bed the night before surgery  Place CLEAN SHEETS on your bed the night before your surgery  DO NOT SLEEP WITH PETS.   Day of Surgery: Take a shower with CHG soap. Do not wear jewelry  Do not wear lotions, powders, colognes, or deodorant.  Men may shave face and neck. Do not bring valuables to the hospital.  [facility] is not responsible for any belongings or valuables.  Wear Clean/Comfortable clothing the morning of surgery Remember to brush your teeth WITH YOUR REGULAR TOOTHPASTE.   Please read over the following fact sheets that you were given.    If you received a COVID test during your pre-op visit  it is requested that you wear a mask when out in public,  stay away from anyone that may not be feeling well and notify your surgeon if you develop symptoms. If you have been in contact with anyone that has tested positive in the last 10 days please notify you surgeon.

## 2021-10-24 NOTE — Anesthesia Preprocedure Evaluation (Signed)
Anesthesia Evaluation  Patient identified by MRN, date of birth, ID band Patient awake    Reviewed: Allergy & Precautions, NPO status , Patient's Chart, lab work & pertinent test results  Airway Mallampati: II  TM Distance: >3 FB Neck ROM: Full    Dental  (+) Edentulous Upper, Edentulous Lower   Pulmonary neg pulmonary ROS, former smoker,    Pulmonary exam normal        Cardiovascular hypertension, Pt. on medications and Pt. on home beta blockers + angina + CAD and + Peripheral Vascular Disease  + Valvular Problems/Murmurs AS  Rhythm:Regular Rate:Normal + Systolic murmurs    Neuro/Psych TIACVA negative psych ROS   GI/Hepatic Neg liver ROS, hiatal hernia, GERD  Medicated,  Endo/Other  diabetes, Type 2, Oral Hypoglycemic Agents, Insulin Dependent  Renal/GU   negative genitourinary   Musculoskeletal  (+) Arthritis , Osteoarthritis,    Abdominal Normal abdominal exam  (+)   Peds  Hematology  (+) Blood dyscrasia, anemia ,   Anesthesia Other Findings   Reproductive/Obstetrics                            Anesthesia Physical Anesthesia Plan  ASA: 4  Anesthesia Plan: General   Post-op Pain Management:    Induction: Intravenous  PONV Risk Score and Plan: 2 and Midazolam and Treatment may vary due to age or medical condition  Airway Management Planned: Mask and Oral ETT  Additional Equipment: Arterial line, CVP, TEE, 3D TEE and Ultrasound Guidance Line Placement  Intra-op Plan:   Post-operative Plan: Post-operative intubation/ventilation  Informed Consent: I have reviewed the patients History and Physical, chart, labs and discussed the procedure including the risks, benefits and alternatives for the proposed anesthesia with the patient or authorized representative who has indicated his/her understanding and acceptance.     Dental advisory given  Plan Discussed with:   Anesthesia Plan  Comments: (Lab Results      Component                Value               Date                      WBC                      6.1                 10/24/2021                HGB                      10.2 (L)            10/24/2021                HCT                      33.0 (L)            10/24/2021                MCV                      93.2                10/24/2021  PLT                      316                 10/24/2021           Lab Results      Component                Value               Date                      NA                       134 (L)             10/24/2021                K                        4.1                 10/24/2021                CO2                      19 (L)              10/24/2021                GLUCOSE                  237 (H)             10/24/2021                BUN                      20                  10/24/2021                CREATININE               0.90                10/24/2021                CALCIUM                  9.0                 10/24/2021                EGFR                     71                  10/03/2021                GFRNONAA                 >60                 10/24/2021            Cath 05/23: 1. Severe multivessel coronary disease, including diffuse LMCA disease of up to 70% with pressure dampening of 13F diagnostic catheter, 90% distal  LCx stenosis, and sequential 70-80% ostial, 40% proximal, 80% mid, and 50% distal RCA lesions.  Widespread heavy calcification of the coronary arteries is evident. 2. Upper normal left ventricular filling pressure (LVEDP 15 mmHg). 3. Mild-moderate aortic valve stenosis (peak-to-peak gradient 21 mmHg).  ECHO 04/23: 1. Mild to moderate AS (mean gradient 13 mmHg; AVA 1.5 cm2); gradient  slightly less on present study compared to 07/18/20.  2. Left ventricular ejection fraction, by estimation, is 60 to 65%. The  left ventricle has normal function. The left ventricle has no regional   wall motion abnormalities. Left ventricular diastolic parameters are  consistent with Grade I diastolic  dysfunction (impaired relaxation). The average left ventricular global  longitudinal strain is -20.9 %. The global longitudinal strain is normal.  3. Right ventricular systolic function is normal. The right ventricular  size is normal. Tricuspid regurgitation signal is inadequate for assessing  PA pressure.  4. The mitral valve is normal in structure. Trivial mitral valve  regurgitation. No evidence of mitral stenosis.  5. The aortic valve is tricuspid. Aortic valve regurgitation is not  visualized. Mild to moderate aortic valve stenosis.  6. The inferior vena cava is normal in size with greater than 50%  respiratory variability, suggesting right atrial pressure of 3 mmHg )       Anesthesia Quick Evaluation

## 2021-10-24 NOTE — Progress Notes (Signed)
PCP - Dr. Maurine Cane Cardiologist - Dr. Gwyndolyn Kaufman  PPM/ICD - denies   Chest x-ray - 10/24/21 at PAT EKG - 10/05/21 Stress Test - 2014 ECHO - 09/12/21 Cardiac Cath - 10/05/21  Sleep Study - several years ago per pt, OSA-  DM- Type 2 Fasting Blood Sugar - 200-250 Checks Blood Sugar 5-6 times a day. Pt has continuous sensor  ASA/Blood Thinner Instructions: Hold ASA and Plavix 7 days prior to surgery. Last dose 5/24   ERAS Protcol - no, NPO   COVID TEST- 10/24/21 at PAT   Anesthesia review: yes, cardiac hx  Patient denies shortness of breath, fever, cough and chest pain at PAT appointment   All instructions explained to the patient, with a verbal understanding of the material. Patient agrees to go over the instructions while at home for a better understanding. Patient also instructed to wear a mask in public after being tested for COVID-19. The opportunity to ask questions was provided.

## 2021-10-25 ENCOUNTER — Inpatient Hospital Stay (HOSPITAL_COMMUNITY)
Admission: RE | Admit: 2021-10-25 | Discharge: 2021-10-31 | DRG: 220 | Disposition: A | Discharge status: Home / Self Care | Payer: HMO | Attending: Thoracic Surgery (Cardiothoracic Vascular Surgery) | Admitting: Thoracic Surgery (Cardiothoracic Vascular Surgery)

## 2021-10-25 ENCOUNTER — Inpatient Hospital Stay (HOSPITAL_COMMUNITY): Payer: HMO

## 2021-10-25 ENCOUNTER — Inpatient Hospital Stay (HOSPITAL_COMMUNITY): Payer: HMO | Admitting: Certified Registered Nurse Anesthetist

## 2021-10-25 ENCOUNTER — Inpatient Hospital Stay (HOSPITAL_COMMUNITY)
Admission: RE | Disposition: A | Discharge status: Home / Self Care | Payer: Self-pay | Source: Home / Self Care | Attending: Thoracic Surgery (Cardiothoracic Vascular Surgery)

## 2021-10-25 ENCOUNTER — Other Ambulatory Visit: Payer: Self-pay

## 2021-10-25 ENCOUNTER — Inpatient Hospital Stay (HOSPITAL_COMMUNITY): Payer: HMO | Admitting: Emergency Medicine

## 2021-10-25 ENCOUNTER — Encounter (HOSPITAL_COMMUNITY): Payer: Self-pay | Admitting: Thoracic Surgery (Cardiothoracic Vascular Surgery)

## 2021-10-25 DIAGNOSIS — Z20822 Contact with and (suspected) exposure to covid-19: Secondary | ICD-10-CM | POA: Diagnosis present

## 2021-10-25 DIAGNOSIS — E8770 Fluid overload, unspecified: Secondary | ICD-10-CM | POA: Diagnosis not present

## 2021-10-25 DIAGNOSIS — E1151 Type 2 diabetes mellitus with diabetic peripheral angiopathy without gangrene: Secondary | ICD-10-CM | POA: Diagnosis present

## 2021-10-25 DIAGNOSIS — I10 Essential (primary) hypertension: Secondary | ICD-10-CM | POA: Diagnosis present

## 2021-10-25 DIAGNOSIS — K219 Gastro-esophageal reflux disease without esophagitis: Secondary | ICD-10-CM | POA: Diagnosis present

## 2021-10-25 DIAGNOSIS — E11649 Type 2 diabetes mellitus with hypoglycemia without coma: Secondary | ICD-10-CM | POA: Diagnosis not present

## 2021-10-25 DIAGNOSIS — D62 Acute posthemorrhagic anemia: Secondary | ICD-10-CM | POA: Diagnosis not present

## 2021-10-25 DIAGNOSIS — J9811 Atelectasis: Secondary | ICD-10-CM | POA: Diagnosis not present

## 2021-10-25 DIAGNOSIS — Z823 Family history of stroke: Secondary | ICD-10-CM

## 2021-10-25 DIAGNOSIS — Z952 Presence of prosthetic heart valve: Secondary | ICD-10-CM | POA: Diagnosis not present

## 2021-10-25 DIAGNOSIS — Z7982 Long term (current) use of aspirin: Secondary | ICD-10-CM

## 2021-10-25 DIAGNOSIS — Z8249 Family history of ischemic heart disease and other diseases of the circulatory system: Secondary | ICD-10-CM

## 2021-10-25 DIAGNOSIS — E785 Hyperlipidemia, unspecified: Secondary | ICD-10-CM | POA: Diagnosis not present

## 2021-10-25 DIAGNOSIS — K5900 Constipation, unspecified: Secondary | ICD-10-CM | POA: Diagnosis not present

## 2021-10-25 DIAGNOSIS — I442 Atrioventricular block, complete: Secondary | ICD-10-CM | POA: Diagnosis not present

## 2021-10-25 DIAGNOSIS — E7800 Pure hypercholesterolemia, unspecified: Secondary | ICD-10-CM | POA: Diagnosis present

## 2021-10-25 DIAGNOSIS — Z961 Presence of intraocular lens: Secondary | ICD-10-CM | POA: Diagnosis present

## 2021-10-25 DIAGNOSIS — Z7985 Long-term (current) use of injectable non-insulin antidiabetic drugs: Secondary | ICD-10-CM

## 2021-10-25 DIAGNOSIS — E1165 Type 2 diabetes mellitus with hyperglycemia: Secondary | ICD-10-CM | POA: Diagnosis present

## 2021-10-25 DIAGNOSIS — J9589 Other postprocedural complications and disorders of respiratory system, not elsewhere classified: Secondary | ICD-10-CM | POA: Diagnosis not present

## 2021-10-25 DIAGNOSIS — E1142 Type 2 diabetes mellitus with diabetic polyneuropathy: Secondary | ICD-10-CM | POA: Diagnosis present

## 2021-10-25 DIAGNOSIS — F17210 Nicotine dependence, cigarettes, uncomplicated: Secondary | ICD-10-CM | POA: Diagnosis present

## 2021-10-25 DIAGNOSIS — I350 Nonrheumatic aortic (valve) stenosis: Secondary | ICD-10-CM | POA: Diagnosis present

## 2021-10-25 DIAGNOSIS — N400 Enlarged prostate without lower urinary tract symptoms: Secondary | ICD-10-CM | POA: Diagnosis present

## 2021-10-25 DIAGNOSIS — I2510 Atherosclerotic heart disease of native coronary artery without angina pectoris: Secondary | ICD-10-CM | POA: Diagnosis present

## 2021-10-25 DIAGNOSIS — Z8673 Personal history of transient ischemic attack (TIA), and cerebral infarction without residual deficits: Secondary | ICD-10-CM

## 2021-10-25 DIAGNOSIS — Z85828 Personal history of other malignant neoplasm of skin: Secondary | ICD-10-CM

## 2021-10-25 DIAGNOSIS — I150 Renovascular hypertension: Secondary | ICD-10-CM | POA: Diagnosis not present

## 2021-10-25 DIAGNOSIS — Z79899 Other long term (current) drug therapy: Secondary | ICD-10-CM

## 2021-10-25 DIAGNOSIS — I700 Atherosclerosis of aorta: Secondary | ICD-10-CM | POA: Diagnosis not present

## 2021-10-25 DIAGNOSIS — Z7984 Long term (current) use of oral hypoglycemic drugs: Secondary | ICD-10-CM

## 2021-10-25 DIAGNOSIS — I25119 Atherosclerotic heart disease of native coronary artery with unspecified angina pectoris: Secondary | ICD-10-CM | POA: Diagnosis not present

## 2021-10-25 DIAGNOSIS — M1990 Unspecified osteoarthritis, unspecified site: Secondary | ICD-10-CM | POA: Diagnosis present

## 2021-10-25 DIAGNOSIS — R0689 Other abnormalities of breathing: Secondary | ICD-10-CM | POA: Diagnosis not present

## 2021-10-25 DIAGNOSIS — Z7902 Long term (current) use of antithrombotics/antiplatelets: Secondary | ICD-10-CM | POA: Diagnosis not present

## 2021-10-25 DIAGNOSIS — Z833 Family history of diabetes mellitus: Secondary | ICD-10-CM

## 2021-10-25 DIAGNOSIS — Z951 Presence of aortocoronary bypass graft: Secondary | ICD-10-CM

## 2021-10-25 DIAGNOSIS — Z981 Arthrodesis status: Secondary | ICD-10-CM

## 2021-10-25 HISTORY — PX: AORTIC VALVE REPLACEMENT: SHX41

## 2021-10-25 HISTORY — PX: CORONARY ARTERY BYPASS GRAFT: SHX141

## 2021-10-25 HISTORY — PX: ENDOVEIN HARVEST OF GREATER SAPHENOUS VEIN: SHX5059

## 2021-10-25 HISTORY — PX: TEE WITHOUT CARDIOVERSION: SHX5443

## 2021-10-25 LAB — POCT I-STAT 7, (LYTES, BLD GAS, ICA,H+H)
Acid-Base Excess: 1 mmol/L (ref 0.0–2.0)
Acid-Base Excess: 1 mmol/L (ref 0.0–2.0)
Acid-base deficit: 1 mmol/L (ref 0.0–2.0)
Acid-base deficit: 2 mmol/L (ref 0.0–2.0)
Acid-base deficit: 2 mmol/L (ref 0.0–2.0)
Acid-base deficit: 4 mmol/L — ABNORMAL HIGH (ref 0.0–2.0)
Acid-base deficit: 5 mmol/L — ABNORMAL HIGH (ref 0.0–2.0)
Bicarbonate: 24.5 mmol/L (ref 20.0–28.0)
Bicarbonate: 26.1 mmol/L (ref 20.0–28.0)
Bicarbonate: 24.9 mmol/L (ref 20.0–28.0)
Bicarbonate: 22.6 mmol/L (ref 20.0–28.0)
Bicarbonate: 22.6 mmol/L (ref 20.0–28.0)
Bicarbonate: 19.3 mmol/L — ABNORMAL LOW (ref 20.0–28.0)
Bicarbonate: 19.6 mmol/L — ABNORMAL LOW (ref 20.0–28.0)
Calcium, Ion: 1.27 mmol/L (ref 1.15–1.40)
Calcium, Ion: 1.03 mmol/L — ABNORMAL LOW (ref 1.15–1.40)
Calcium, Ion: 1.13 mmol/L — ABNORMAL LOW (ref 1.15–1.40)
Calcium, Ion: 1.35 mmol/L (ref 1.15–1.40)
Calcium, Ion: 1.19 mmol/L (ref 1.15–1.40)
Calcium, Ion: 1.16 mmol/L (ref 1.15–1.40)
Calcium, Ion: 1.2 mmol/L (ref 1.15–1.40)
HCT: 29 % — ABNORMAL LOW (ref 39.0–52.0)
HCT: 20 % — ABNORMAL LOW (ref 39.0–52.0)
HCT: 20 % — ABNORMAL LOW (ref 39.0–52.0)
HCT: 26 % — ABNORMAL LOW (ref 39.0–52.0)
HCT: 27 % — ABNORMAL LOW (ref 39.0–52.0)
HCT: 21 % — ABNORMAL LOW (ref 39.0–52.0)
HCT: 22 % — ABNORMAL LOW (ref 39.0–52.0)
Hemoglobin: 9.9 g/dL — ABNORMAL LOW (ref 13.0–17.0)
Hemoglobin: 6.8 g/dL — CL (ref 13.0–17.0)
Hemoglobin: 6.8 g/dL — CL (ref 13.0–17.0)
Hemoglobin: 8.8 g/dL — ABNORMAL LOW (ref 13.0–17.0)
Hemoglobin: 9.2 g/dL — ABNORMAL LOW (ref 13.0–17.0)
Hemoglobin: 7.1 g/dL — ABNORMAL LOW (ref 13.0–17.0)
Hemoglobin: 7.5 g/dL — ABNORMAL LOW (ref 13.0–17.0)
O2 Saturation: 100 %
O2 Saturation: 100 %
O2 Saturation: 100 %
O2 Saturation: 98 %
O2 Saturation: 100 %
O2 Saturation: 99 %
O2 Saturation: 99 %
Patient temperature: 97.3
Patient temperature: 97.8
Patient temperature: 97.8
Potassium: 3.5 mmol/L (ref 3.5–5.1)
Potassium: 3.5 mmol/L (ref 3.5–5.1)
Potassium: 4.3 mmol/L (ref 3.5–5.1)
Potassium: 4.1 mmol/L (ref 3.5–5.1)
Potassium: 3.7 mmol/L (ref 3.5–5.1)
Potassium: 4.2 mmol/L (ref 3.5–5.1)
Potassium: 4.2 mmol/L (ref 3.5–5.1)
Sodium: 140 mmol/L (ref 135–145)
Sodium: 139 mmol/L (ref 135–145)
Sodium: 138 mmol/L (ref 135–145)
Sodium: 141 mmol/L (ref 135–145)
Sodium: 143 mmol/L (ref 135–145)
Sodium: 141 mmol/L (ref 135–145)
Sodium: 140 mmol/L (ref 135–145)
TCO2: 26 mmol/L (ref 22–32)
TCO2: 27 mmol/L (ref 22–32)
TCO2: 26 mmol/L (ref 22–32)
TCO2: 24 mmol/L (ref 22–32)
TCO2: 24 mmol/L (ref 22–32)
TCO2: 20 mmol/L — ABNORMAL LOW (ref 22–32)
TCO2: 21 mmol/L — ABNORMAL LOW (ref 22–32)
pCO2 arterial: 42 mmHg (ref 32–48)
pCO2 arterial: 41.4 mmHg (ref 32–48)
pCO2 arterial: 37.6 mmHg (ref 32–48)
pCO2 arterial: 38 mmHg (ref 32–48)
pCO2 arterial: 34.5 mmHg (ref 32–48)
pCO2 arterial: 27.2 mmHg — ABNORMAL LOW (ref 32–48)
pCO2 arterial: 34.8 mmHg (ref 32–48)
pH, Arterial: 7.374 (ref 7.35–7.45)
pH, Arterial: 7.409 (ref 7.35–7.45)
pH, Arterial: 7.429 (ref 7.35–7.45)
pH, Arterial: 7.382 (ref 7.35–7.45)
pH, Arterial: 7.421 (ref 7.35–7.45)
pH, Arterial: 7.456 — ABNORMAL HIGH (ref 7.35–7.45)
pH, Arterial: 7.357 (ref 7.35–7.45)
pO2, Arterial: 374 mmHg — ABNORMAL HIGH (ref 83–108)
pO2, Arterial: 357 mmHg — ABNORMAL HIGH (ref 83–108)
pO2, Arterial: 269 mmHg — ABNORMAL HIGH (ref 83–108)
pO2, Arterial: 112 mmHg — ABNORMAL HIGH (ref 83–108)
pO2, Arterial: 202 mmHg — ABNORMAL HIGH (ref 83–108)
pO2, Arterial: 121 mmHg — ABNORMAL HIGH (ref 83–108)
pO2, Arterial: 140 mmHg — ABNORMAL HIGH (ref 83–108)

## 2021-10-25 LAB — POCT I-STAT EG7
Acid-Base Excess: 0 mmol/L (ref 0.0–2.0)
Bicarbonate: 24.9 mmol/L (ref 20.0–28.0)
Calcium, Ion: 1.08 mmol/L — ABNORMAL LOW (ref 1.15–1.40)
HCT: 20 % — ABNORMAL LOW (ref 39.0–52.0)
Hemoglobin: 6.8 g/dL — CL (ref 13.0–17.0)
O2 Saturation: 76 %
Potassium: 3.5 mmol/L (ref 3.5–5.1)
Sodium: 138 mmol/L (ref 135–145)
TCO2: 26 mmol/L (ref 22–32)
pCO2, Ven: 41.7 mmHg — ABNORMAL LOW (ref 44–60)
pH, Ven: 7.384 (ref 7.25–7.43)
pO2, Ven: 42 mmHg (ref 32–45)

## 2021-10-25 LAB — CBC
HCT: 25.8 % — ABNORMAL LOW (ref 39.0–52.0)
HCT: 23.6 % — ABNORMAL LOW (ref 39.0–52.0)
Hemoglobin: 8.6 g/dL — ABNORMAL LOW (ref 13.0–17.0)
Hemoglobin: 7.8 g/dL — ABNORMAL LOW (ref 13.0–17.0)
MCH: 30.1 pg (ref 26.0–34.0)
MCH: 29.8 pg (ref 26.0–34.0)
MCHC: 33.3 g/dL (ref 30.0–36.0)
MCHC: 33.1 g/dL (ref 30.0–36.0)
MCV: 90.2 fL (ref 80.0–100.0)
MCV: 90.1 fL (ref 80.0–100.0)
Platelets: 194 10*3/uL (ref 150–400)
Platelets: 210 10*3/uL (ref 150–400)
RBC: 2.86 MIL/uL — ABNORMAL LOW (ref 4.22–5.81)
RBC: 2.62 MIL/uL — ABNORMAL LOW (ref 4.22–5.81)
RDW: 14.9 % (ref 11.5–15.5)
RDW: 15.3 % (ref 11.5–15.5)
WBC: 8.7 10*3/uL (ref 4.0–10.5)
WBC: 8.5 10*3/uL (ref 4.0–10.5)
nRBC: 0 % (ref 0.0–0.2)
nRBC: 0 % (ref 0.0–0.2)

## 2021-10-25 LAB — POCT I-STAT, CHEM 8
BUN: 16 mg/dL (ref 8–23)
BUN: 16 mg/dL (ref 8–23)
BUN: 16 mg/dL (ref 8–23)
BUN: 14 mg/dL (ref 8–23)
BUN: 14 mg/dL (ref 8–23)
Calcium, Ion: 1.26 mmol/L (ref 1.15–1.40)
Calcium, Ion: 1.26 mmol/L (ref 1.15–1.40)
Calcium, Ion: 1.11 mmol/L — ABNORMAL LOW (ref 1.15–1.40)
Calcium, Ion: 1.12 mmol/L — ABNORMAL LOW (ref 1.15–1.40)
Calcium, Ion: 1.32 mmol/L (ref 1.15–1.40)
Chloride: 105 mmol/L (ref 98–111)
Chloride: 103 mmol/L (ref 98–111)
Chloride: 103 mmol/L (ref 98–111)
Chloride: 103 mmol/L (ref 98–111)
Chloride: 105 mmol/L (ref 98–111)
Creatinine, Ser: 0.6 mg/dL — ABNORMAL LOW (ref 0.61–1.24)
Creatinine, Ser: 0.5 mg/dL — ABNORMAL LOW (ref 0.61–1.24)
Creatinine, Ser: 0.5 mg/dL — ABNORMAL LOW (ref 0.61–1.24)
Creatinine, Ser: 0.5 mg/dL — ABNORMAL LOW (ref 0.61–1.24)
Creatinine, Ser: 0.6 mg/dL — ABNORMAL LOW (ref 0.61–1.24)
Glucose, Bld: 83 mg/dL (ref 70–99)
Glucose, Bld: 78 mg/dL (ref 70–99)
Glucose, Bld: 75 mg/dL (ref 70–99)
Glucose, Bld: 74 mg/dL (ref 70–99)
Glucose, Bld: 87 mg/dL (ref 70–99)
HCT: 29 % — ABNORMAL LOW (ref 39.0–52.0)
HCT: 29 % — ABNORMAL LOW (ref 39.0–52.0)
HCT: 20 % — ABNORMAL LOW (ref 39.0–52.0)
HCT: 20 % — ABNORMAL LOW (ref 39.0–52.0)
HCT: 25 % — ABNORMAL LOW (ref 39.0–52.0)
Hemoglobin: 9.9 g/dL — ABNORMAL LOW (ref 13.0–17.0)
Hemoglobin: 9.9 g/dL — ABNORMAL LOW (ref 13.0–17.0)
Hemoglobin: 6.8 g/dL — CL (ref 13.0–17.0)
Hemoglobin: 6.8 g/dL — CL (ref 13.0–17.0)
Hemoglobin: 8.5 g/dL — ABNORMAL LOW (ref 13.0–17.0)
Potassium: 3.5 mmol/L (ref 3.5–5.1)
Potassium: 3.4 mmol/L — ABNORMAL LOW (ref 3.5–5.1)
Potassium: 4 mmol/L (ref 3.5–5.1)
Potassium: 3.8 mmol/L (ref 3.5–5.1)
Potassium: 4 mmol/L (ref 3.5–5.1)
Sodium: 139 mmol/L (ref 135–145)
Sodium: 138 mmol/L (ref 135–145)
Sodium: 137 mmol/L (ref 135–145)
Sodium: 138 mmol/L (ref 135–145)
Sodium: 139 mmol/L (ref 135–145)
TCO2: 25 mmol/L (ref 22–32)
TCO2: 25 mmol/L (ref 22–32)
TCO2: 26 mmol/L (ref 22–32)
TCO2: 24 mmol/L (ref 22–32)
TCO2: 23 mmol/L (ref 22–32)

## 2021-10-25 LAB — HEMOGLOBIN AND HEMATOCRIT, BLOOD
HCT: 19.4 % — ABNORMAL LOW (ref 39.0–52.0)
Hemoglobin: 6.1 g/dL — CL (ref 13.0–17.0)

## 2021-10-25 LAB — GLUCOSE, CAPILLARY
Glucose-Capillary: 108 mg/dL — ABNORMAL HIGH (ref 70–99)
Glucose-Capillary: 112 mg/dL — ABNORMAL HIGH (ref 70–99)
Glucose-Capillary: 130 mg/dL — ABNORMAL HIGH (ref 70–99)
Glucose-Capillary: 146 mg/dL — ABNORMAL HIGH (ref 70–99)
Glucose-Capillary: 70 mg/dL (ref 70–99)
Glucose-Capillary: 74 mg/dL (ref 70–99)
Glucose-Capillary: 83 mg/dL (ref 70–99)
Glucose-Capillary: 86 mg/dL (ref 70–99)
Glucose-Capillary: 96 mg/dL (ref 70–99)

## 2021-10-25 LAB — BASIC METABOLIC PANEL
Anion gap: 4 — ABNORMAL LOW (ref 5–15)
BUN: 13 mg/dL (ref 8–23)
CO2: 20 mmol/L — ABNORMAL LOW (ref 22–32)
Calcium: 7.9 mg/dL — ABNORMAL LOW (ref 8.9–10.3)
Chloride: 112 mmol/L — ABNORMAL HIGH (ref 98–111)
Creatinine, Ser: 0.8 mg/dL (ref 0.61–1.24)
GFR, Estimated: >60 mL/min (ref >60)
Glucose, Bld: 146 mg/dL — ABNORMAL HIGH (ref 70–99)
Potassium: 4.4 mmol/L (ref 3.5–5.1)
Sodium: 136 mmol/L (ref 135–145)

## 2021-10-25 LAB — APTT: aPTT: 45 seconds — ABNORMAL HIGH (ref 24–36)

## 2021-10-25 LAB — PROTIME-INR
INR: 1.4 — ABNORMAL HIGH (ref 0.8–1.2)
Prothrombin Time: 17.1 seconds — ABNORMAL HIGH (ref 11.4–15.2)

## 2021-10-25 LAB — PLATELET COUNT: Platelets: 233 10*3/uL (ref 150–400)

## 2021-10-25 LAB — PREPARE RBC (CROSSMATCH)

## 2021-10-25 SURGERY — CORONARY ARTERY BYPASS GRAFTING (CABG)
Anesthesia: General | Site: Chest | Laterality: Right

## 2021-10-25 MED ORDER — ALBUMIN HUMAN 5 % IV SOLN
250.0000 mL | INTRAVENOUS | Status: AC | PRN
  Administered 2021-10-25 (×2): 12.5 g via INTRAVENOUS
  Filled 2021-10-25 (×2): qty 250

## 2021-10-25 MED ORDER — PROPOFOL 10 MG/ML IV BOLUS
INTRAVENOUS | Status: AC
  Filled 2021-10-25: qty 20

## 2021-10-25 MED ORDER — CHLORHEXIDINE GLUCONATE 0.12 % MT SOLN
15.0000 mL | Freq: Once | OROMUCOSAL | Status: AC
  Administered 2021-10-25: 15 mL via OROMUCOSAL

## 2021-10-25 MED ORDER — DEXTROSE 50 % IV SOLN
0.0000 mL | INTRAVENOUS | Status: DC | PRN
  Administered 2021-10-25: 50 mL via INTRAVENOUS
  Filled 2021-10-25: qty 50

## 2021-10-25 MED ORDER — HEPARIN SODIUM (PORCINE) 1000 UNIT/ML IJ SOLN
INTRAMUSCULAR | Status: AC
  Filled 2021-10-25: qty 1

## 2021-10-25 MED ORDER — MIDAZOLAM HCL 2 MG/2ML IJ SOLN: 2.0000 mg | INTRAMUSCULAR | Status: DC | PRN

## 2021-10-25 MED ORDER — CEFAZOLIN SODIUM-DEXTROSE 2-4 GM/100ML-% IV SOLN
2.0000 g | Freq: Three times a day (TID) | INTRAVENOUS | Status: DC
  Filled 2021-10-25: qty 100

## 2021-10-25 MED ORDER — MAGNESIUM SULFATE 4 GM/100ML IV SOLN
INTRAVENOUS | Status: AC
  Filled 2021-10-25: qty 100

## 2021-10-25 MED ORDER — PLASMA-LYTE A IV SOLN
INTRAVENOUS | Status: DC | PRN
  Administered 2021-10-25: 500 mL via INTRAVASCULAR

## 2021-10-25 MED ORDER — FENTANYL CITRATE (PF) 250 MCG/5ML IJ SOLN
INTRAMUSCULAR | Status: AC
  Filled 2021-10-25: qty 5

## 2021-10-25 MED ORDER — ORAL CARE MOUTH RINSE: 15.0000 mL | Freq: Once | OROMUCOSAL | Status: AC

## 2021-10-25 MED ORDER — DEXMEDETOMIDINE HCL IN NACL 400 MCG/100ML IV SOLN
INTRAVENOUS | Status: DC | PRN
  Administered 2021-10-25: .5 ug/kg/h via INTRAVENOUS

## 2021-10-25 MED ORDER — LACTATED RINGERS IV SOLN: INTRAVENOUS | Status: DC | PRN

## 2021-10-25 MED ORDER — MORPHINE SULFATE (PF) 2 MG/ML IV SOLN
1.0000 mg | INTRAVENOUS | Status: DC | PRN
  Administered 2021-10-25: 1 mg via INTRAVENOUS
  Administered 2021-10-26: 4 mg via INTRAVENOUS
  Administered 2021-10-26 (×3): 2 mg via INTRAVENOUS
  Administered 2021-10-26 – 2021-10-27 (×3): 4 mg via INTRAVENOUS
  Filled 2021-10-25 (×2): qty 2
  Filled 2021-10-25 (×4): qty 1
  Filled 2021-10-25 (×2): qty 2
  Filled 2021-10-25: qty 1

## 2021-10-25 MED ORDER — ACETAMINOPHEN 500 MG PO TABS
1000.0000 mg | ORAL_TABLET | Freq: Four times a day (QID) | ORAL | Status: AC
  Administered 2021-10-25 – 2021-10-30 (×18): 1000 mg via ORAL
  Filled 2021-10-25 (×18): qty 2

## 2021-10-25 MED ORDER — PHENYLEPHRINE HCL-NACL 20-0.9 MG/250ML-% IV SOLN
0.0000 ug/min | INTRAVENOUS | Status: DC
  Administered 2021-10-25: 60 ug/min via INTRAVENOUS
  Administered 2021-10-25: 90 ug/min via INTRAVENOUS
  Administered 2021-10-26: 70 ug/min via INTRAVENOUS
  Filled 2021-10-25 (×3): qty 250

## 2021-10-25 MED ORDER — MIDAZOLAM HCL (PF) 5 MG/ML IJ SOLN
INTRAMUSCULAR | Status: DC | PRN
  Administered 2021-10-25: 3 mg via INTRAVENOUS
  Administered 2021-10-25 (×3): 1 mg via INTRAVENOUS
  Administered 2021-10-25 (×2): 2 mg via INTRAVENOUS

## 2021-10-25 MED ORDER — HEPARIN SODIUM (PORCINE) 1000 UNIT/ML IJ SOLN
INTRAMUSCULAR | Status: DC | PRN
  Administered 2021-10-25: 25000 [IU] via INTRAVENOUS

## 2021-10-25 MED ORDER — PROPOFOL 10 MG/ML IV BOLUS
INTRAVENOUS | Status: DC | PRN
  Administered 2021-10-25: 50 mg via INTRAVENOUS
  Administered 2021-10-25: 30 mg via INTRAVENOUS
  Administered 2021-10-25: 80 mg via INTRAVENOUS

## 2021-10-25 MED ORDER — POTASSIUM CHLORIDE 10 MEQ/50ML IV SOLN
10.0000 meq | INTRAVENOUS | Status: AC
  Administered 2021-10-25 (×3): 10 meq via INTRAVENOUS

## 2021-10-25 MED ORDER — VANCOMYCIN HCL IN DEXTROSE 1-5 GM/200ML-% IV SOLN
1000.0000 mg | Freq: Once | INTRAVENOUS | Status: AC
  Administered 2021-10-25: 1000 mg via INTRAVENOUS
  Filled 2021-10-25: qty 200

## 2021-10-25 MED ORDER — EPHEDRINE SULFATE-NACL 50-0.9 MG/10ML-% IV SOSY
PREFILLED_SYRINGE | INTRAVENOUS | Status: DC | PRN
  Administered 2021-10-25: 5 mg via INTRAVENOUS

## 2021-10-25 MED ORDER — OXYCODONE HCL 5 MG PO TABS
5.0000 mg | ORAL_TABLET | ORAL | Status: DC | PRN
  Administered 2021-10-26 – 2021-10-29 (×14): 10 mg via ORAL
  Filled 2021-10-25 (×14): qty 2

## 2021-10-25 MED ORDER — LACTATED RINGERS IV SOLN: 500.0000 mL | Freq: Once | INTRAVENOUS | Status: DC | PRN

## 2021-10-25 MED ORDER — MILRINONE LACTATE IN DEXTROSE 20-5 MG/100ML-% IV SOLN: 0.3000 ug/kg/min | INTRAVENOUS | Status: DC

## 2021-10-25 MED ORDER — PANTOPRAZOLE SODIUM 40 MG PO TBEC
40.0000 mg | DELAYED_RELEASE_TABLET | Freq: Every day | ORAL | Status: DC
  Administered 2021-10-26 – 2021-10-31 (×6): 40 mg via ORAL
  Filled 2021-10-25 (×6): qty 1

## 2021-10-25 MED ORDER — LACTATED RINGERS IV SOLN: INTRAVENOUS | Status: DC

## 2021-10-25 MED ORDER — FAMOTIDINE IN NACL 20-0.9 MG/50ML-% IV SOLN
20.0000 mg | Freq: Two times a day (BID) | INTRAVENOUS | Status: DC
  Administered 2021-10-25: 20 mg via INTRAVENOUS
  Filled 2021-10-25 (×2): qty 50

## 2021-10-25 MED ORDER — SODIUM CHLORIDE 0.9% FLUSH
3.0000 mL | INTRAVENOUS | Status: DC | PRN
Start: 2021-10-26 — End: 2021-10-31

## 2021-10-25 MED ORDER — PANTOPRAZOLE SODIUM 40 MG PO TBEC
40.0000 mg | DELAYED_RELEASE_TABLET | Freq: Every day | ORAL | Status: DC
Start: 2021-10-27 — End: 2021-10-25

## 2021-10-25 MED ORDER — PHENYLEPHRINE 80 MCG/ML (10ML) SYRINGE FOR IV PUSH (FOR BLOOD PRESSURE SUPPORT)
PREFILLED_SYRINGE | INTRAVENOUS | Status: DC | PRN
  Administered 2021-10-25: 160 ug via INTRAVENOUS

## 2021-10-25 MED ORDER — SODIUM CHLORIDE 0.45 % IV SOLN: INTRAVENOUS | Status: DC | PRN

## 2021-10-25 MED ORDER — ROSUVASTATIN CALCIUM 20 MG PO TABS
40.0000 mg | ORAL_TABLET | Freq: Every day | ORAL | Status: DC
  Administered 2021-10-26 – 2021-10-31 (×6): 40 mg via ORAL
  Filled 2021-10-25 (×6): qty 2

## 2021-10-25 MED ORDER — NITROGLYCERIN 0.2 MG/ML ON CALL CATH LAB
INTRAVENOUS | Status: DC | PRN
  Administered 2021-10-25 (×2): .2 mg via INTRAVENOUS

## 2021-10-25 MED ORDER — ROCURONIUM BROMIDE 10 MG/ML (PF) SYRINGE
PREFILLED_SYRINGE | INTRAVENOUS | Status: AC
  Filled 2021-10-25: qty 20

## 2021-10-25 MED ORDER — INSULIN REGULAR(HUMAN) IN NACL 100-0.9 UT/100ML-% IV SOLN: INTRAVENOUS | Status: DC

## 2021-10-25 MED ORDER — METOPROLOL TARTRATE 5 MG/5ML IV SOLN: 2.5000 mg | INTRAVENOUS | Status: DC | PRN

## 2021-10-25 MED ORDER — CEFAZOLIN SODIUM-DEXTROSE 2-4 GM/100ML-% IV SOLN
2.0000 g | Freq: Three times a day (TID) | INTRAVENOUS | Status: AC
  Administered 2021-10-25 – 2021-10-27 (×5): 2 g via INTRAVENOUS
  Filled 2021-10-25 (×5): qty 100

## 2021-10-25 MED ORDER — ROCURONIUM BROMIDE 10 MG/ML (PF) SYRINGE
PREFILLED_SYRINGE | INTRAVENOUS | Status: DC | PRN
  Administered 2021-10-25: 70 mg via INTRAVENOUS
  Administered 2021-10-25: 50 mg via INTRAVENOUS
  Administered 2021-10-25: 30 mg via INTRAVENOUS
  Administered 2021-10-25 (×2): 50 mg via INTRAVENOUS

## 2021-10-25 MED ORDER — DEXMEDETOMIDINE HCL IN NACL 400 MCG/100ML IV SOLN
0.0000 ug/kg/h | INTRAVENOUS | Status: DC
  Administered 2021-10-25: 0.7 ug/kg/h via INTRAVENOUS
  Filled 2021-10-25: qty 100

## 2021-10-25 MED ORDER — ASPIRIN 81 MG PO CHEW: 324.0000 mg | CHEWABLE_TABLET | Freq: Every day | ORAL | Status: DC

## 2021-10-25 MED ORDER — PHENYLEPHRINE 80 MCG/ML (10ML) SYRINGE FOR IV PUSH (FOR BLOOD PRESSURE SUPPORT)
PREFILLED_SYRINGE | INTRAVENOUS | Status: AC
  Filled 2021-10-25: qty 10

## 2021-10-25 MED ORDER — SODIUM CHLORIDE 0.9% FLUSH
10.0000 mL | Freq: Two times a day (BID) | INTRAVENOUS | Status: DC
  Administered 2021-10-25 – 2021-10-27 (×3): 10 mL

## 2021-10-25 MED ORDER — EZETIMIBE 10 MG PO TABS
10.0000 mg | ORAL_TABLET | Freq: Every day | ORAL | Status: DC
  Administered 2021-10-26 – 2021-10-31 (×6): 10 mg via ORAL
  Filled 2021-10-25 (×6): qty 1

## 2021-10-25 MED ORDER — METOPROLOL TARTRATE 12.5 MG HALF TABLET
12.5000 mg | ORAL_TABLET | Freq: Two times a day (BID) | ORAL | Status: DC
  Administered 2021-10-26 – 2021-10-27 (×2): 12.5 mg via ORAL
  Filled 2021-10-25 (×4): qty 1

## 2021-10-25 MED ORDER — NITROGLYCERIN IN D5W 200-5 MCG/ML-% IV SOLN: 0.0000 ug/min | INTRAVENOUS | Status: DC

## 2021-10-25 MED ORDER — FENTANYL CITRATE (PF) 250 MCG/5ML IJ SOLN
INTRAMUSCULAR | Status: DC | PRN
  Administered 2021-10-25: 100 ug via INTRAVENOUS
  Administered 2021-10-25: 150 ug via INTRAVENOUS
  Administered 2021-10-25: 200 ug via INTRAVENOUS
  Administered 2021-10-25: 250 ug via INTRAVENOUS
  Administered 2021-10-25: 50 ug via INTRAVENOUS
  Administered 2021-10-25: 100 ug via INTRAVENOUS
  Administered 2021-10-25: 250 ug via INTRAVENOUS

## 2021-10-25 MED ORDER — ASPIRIN 325 MG PO TBEC
325.0000 mg | DELAYED_RELEASE_TABLET | Freq: Every day | ORAL | Status: DC
  Administered 2021-10-26 – 2021-10-31 (×6): 325 mg via ORAL
  Filled 2021-10-25 (×6): qty 1

## 2021-10-25 MED ORDER — ARTIFICIAL TEARS OPHTHALMIC OINT
TOPICAL_OINTMENT | OPHTHALMIC | Status: DC | PRN
  Administered 2021-10-25: 1 via OPHTHALMIC

## 2021-10-25 MED ORDER — METOPROLOL TARTRATE 25 MG/10 ML ORAL SUSPENSION
12.5000 mg | Freq: Two times a day (BID) | ORAL | Status: DC
Start: 2021-10-25 — End: 2021-10-28

## 2021-10-25 MED ORDER — SODIUM CHLORIDE (PF) 0.9 % IJ SOLN
OROMUCOSAL | Status: DC | PRN
  Administered 2021-10-25: 8 mL via TOPICAL

## 2021-10-25 MED ORDER — EPINEPHRINE HCL 5 MG/250ML IV SOLN IN NS: 0.0000 ug/min | INTRAVENOUS | Status: DC

## 2021-10-25 MED ORDER — ACETAMINOPHEN 160 MG/5ML PO SOLN: 1000.0000 mg | Freq: Four times a day (QID) | ORAL | Status: AC

## 2021-10-25 MED ORDER — SODIUM CHLORIDE 0.9 % IV SOLN: INTRAVENOUS | Status: DC

## 2021-10-25 MED ORDER — SODIUM CHLORIDE 0.9% FLUSH: 10.0000 mL | INTRAVENOUS | Status: DC | PRN

## 2021-10-25 MED ORDER — METOPROLOL TARTRATE 12.5 MG HALF TABLET
12.5000 mg | ORAL_TABLET | Freq: Once | ORAL | Status: DC
  Filled 2021-10-25: qty 1

## 2021-10-25 MED ORDER — INSULIN ASPART 100 UNIT/ML IJ SOLN
0.0000 [IU] | INTRAMUSCULAR | Status: DC
  Administered 2021-10-25: 2 [IU] via SUBCUTANEOUS

## 2021-10-25 MED ORDER — ORAL CARE MOUTH RINSE
15.0000 mL | Freq: Two times a day (BID) | OROMUCOSAL | Status: DC
  Administered 2021-10-25 – 2021-10-31 (×12): 15 mL via OROMUCOSAL

## 2021-10-25 MED ORDER — CHLORHEXIDINE GLUCONATE 0.12 % MT SOLN
15.0000 mL | Freq: Once | OROMUCOSAL | Status: DC
  Filled 2021-10-25: qty 15

## 2021-10-25 MED ORDER — ONDANSETRON HCL 4 MG/2ML IJ SOLN
4.0000 mg | Freq: Four times a day (QID) | INTRAMUSCULAR | Status: DC | PRN
  Administered 2021-10-25 – 2021-10-26 (×2): 4 mg via INTRAVENOUS
  Filled 2021-10-25 (×2): qty 2

## 2021-10-25 MED ORDER — AMLODIPINE BESYLATE 2.5 MG PO TABS: 2.5000 mg | ORAL_TABLET | Freq: Every day | ORAL | Status: DC

## 2021-10-25 MED ORDER — TRAMADOL HCL 50 MG PO TABS
50.0000 mg | ORAL_TABLET | ORAL | Status: DC | PRN
  Administered 2021-10-26 – 2021-10-30 (×2): 50 mg via ORAL
  Filled 2021-10-25 (×2): qty 1

## 2021-10-25 MED ORDER — PROTAMINE SULFATE 10 MG/ML IV SOLN
INTRAVENOUS | Status: AC
  Filled 2021-10-25: qty 25

## 2021-10-25 MED ORDER — FAMOTIDINE IN NACL 20-0.9 MG/50ML-% IV SOLN
INTRAVENOUS | Status: AC
  Filled 2021-10-25: qty 50

## 2021-10-25 MED ORDER — NOREPINEPHRINE 4 MG/250ML-% IV SOLN: 0.0000 ug/min | INTRAVENOUS | Status: DC

## 2021-10-25 MED ORDER — SODIUM CHLORIDE 0.9 % IV SOLN: 250.0000 mL | INTRAVENOUS | Status: DC

## 2021-10-25 MED ORDER — CHLORHEXIDINE GLUCONATE 0.12 % MT SOLN
15.0000 mL | OROMUCOSAL | Status: AC
  Administered 2021-10-25: 15 mL via OROMUCOSAL
  Filled 2021-10-25: qty 15

## 2021-10-25 MED ORDER — ACETAMINOPHEN 160 MG/5ML PO SOLN: 650.0000 mg | Freq: Once | ORAL | Status: AC

## 2021-10-25 MED ORDER — EPHEDRINE 5 MG/ML INJ
INTRAVENOUS | Status: AC
  Filled 2021-10-25: qty 5

## 2021-10-25 MED ORDER — MAGNESIUM SULFATE 4 GM/100ML IV SOLN
4.0000 g | Freq: Once | INTRAVENOUS | Status: AC
  Administered 2021-10-25: 4 g via INTRAVENOUS
  Filled 2021-10-25: qty 100

## 2021-10-25 MED ORDER — DOCUSATE SODIUM 100 MG PO CAPS
200.0000 mg | ORAL_CAPSULE | Freq: Every day | ORAL | Status: DC
  Administered 2021-10-26 – 2021-10-29 (×4): 200 mg via ORAL
  Filled 2021-10-25 (×5): qty 2

## 2021-10-25 MED ORDER — CHLORHEXIDINE GLUCONATE 4 % EX LIQD: 30.0000 mL | CUTANEOUS | Status: DC

## 2021-10-25 MED ORDER — LIDOCAINE 2% (20 MG/ML) 5 ML SYRINGE
INTRAMUSCULAR | Status: AC
  Filled 2021-10-25: qty 5

## 2021-10-25 MED ORDER — PROTAMINE SULFATE 10 MG/ML IV SOLN
INTRAVENOUS | Status: DC | PRN
  Administered 2021-10-25: 250 mg via INTRAVENOUS

## 2021-10-25 MED ORDER — METOCLOPRAMIDE HCL 5 MG/ML IJ SOLN
10.0000 mg | Freq: Four times a day (QID) | INTRAMUSCULAR | Status: AC
  Administered 2021-10-25 – 2021-10-26 (×3): 10 mg via INTRAVENOUS
  Filled 2021-10-25 (×3): qty 2

## 2021-10-25 MED ORDER — FERROUS SULFATE 325 (65 FE) MG PO TABS
325.0000 mg | ORAL_TABLET | Freq: Every day | ORAL | Status: DC
  Administered 2021-10-26: 325 mg via ORAL
  Filled 2021-10-25: qty 1

## 2021-10-25 MED ORDER — 0.9 % SODIUM CHLORIDE (POUR BTL) OPTIME
TOPICAL | Status: DC | PRN
  Administered 2021-10-25: 5000 mL

## 2021-10-25 MED ORDER — ~~LOC~~ CARDIAC SURGERY, PATIENT & FAMILY EDUCATION
Freq: Once | Status: DC
  Filled 2021-10-25: qty 1

## 2021-10-25 MED ORDER — ACETAMINOPHEN 650 MG RE SUPP
650.0000 mg | Freq: Once | RECTAL | Status: AC
  Administered 2021-10-25: 650 mg via RECTAL

## 2021-10-25 MED ORDER — LIDOCAINE 2% (20 MG/ML) 5 ML SYRINGE
INTRAMUSCULAR | Status: DC | PRN
  Administered 2021-10-25: 60 mg via INTRAVENOUS

## 2021-10-25 MED ORDER — BISACODYL 10 MG RE SUPP: 10.0000 mg | Freq: Every day | RECTAL | Status: DC

## 2021-10-25 MED ORDER — BISACODYL 5 MG PO TBEC
10.0000 mg | DELAYED_RELEASE_TABLET | Freq: Every day | ORAL | Status: DC
  Administered 2021-10-26 – 2021-10-29 (×4): 10 mg via ORAL
  Filled 2021-10-25 (×4): qty 2

## 2021-10-25 MED ORDER — SODIUM CHLORIDE 0.9% FLUSH
3.0000 mL | Freq: Two times a day (BID) | INTRAVENOUS | Status: DC
  Administered 2021-10-26 – 2021-10-31 (×7): 3 mL via INTRAVENOUS

## 2021-10-25 MED ORDER — CHLORHEXIDINE GLUCONATE CLOTH 2 % EX PADS
6.0000 | MEDICATED_PAD | Freq: Every day | CUTANEOUS | Status: DC
  Administered 2021-10-25 – 2021-10-31 (×7): 6 via TOPICAL

## 2021-10-25 MED ORDER — MIDAZOLAM HCL (PF) 10 MG/2ML IJ SOLN
INTRAMUSCULAR | Status: AC
  Filled 2021-10-25: qty 2

## 2021-10-25 SURGICAL SUPPLY — 137 items
ADAPTER CARDIO PERF ANTE/RETRO (ADAPTER) ×5 IMPLANT
APPLIER CLIP 9.375 SM OPEN (CLIP) ×5
BAG DECANTER FOR FLEXI CONT (MISCELLANEOUS) ×10 IMPLANT
BLADE 15 SAFETY STRL DISP (BLADE) ×4 IMPLANT
BLADE CLIPPER SURG (BLADE) ×10 IMPLANT
BLADE STERNUM SYSTEM 6 (BLADE) ×10 IMPLANT
BLADE SURG 11 STRL SS (BLADE) ×2 IMPLANT
BLADE SURG 15 STRL LF DISP TIS (BLADE) ×4 IMPLANT
BLADE SURG 15 STRL SS (BLADE) ×5
BNDG ELASTIC 4X5.8 VLCR STR LF (GAUZE/BANDAGES/DRESSINGS) ×8 IMPLANT
BNDG ELASTIC 6X5.8 VLCR STR LF (GAUZE/BANDAGES/DRESSINGS) ×5 IMPLANT
BNDG GAUZE ELAST 4 BULKY (GAUZE/BANDAGES/DRESSINGS) ×5 IMPLANT
CABLE SURGICAL S-101-97-12 (CABLE) ×5 IMPLANT
CANISTER SUCT 3000ML PPV (MISCELLANEOUS) ×10 IMPLANT
CANNULA GUNDRY RCSP 15FR (MISCELLANEOUS) ×5 IMPLANT
CANNULA MC2 2 STG 29/37 NON-V (CANNULA) ×4 IMPLANT
CANNULA MC2 TWO STAGE (CANNULA) ×5
CANNULA NON VENT 20FR 12 (CANNULA) ×10 IMPLANT
CANNULA SUMP PERICARDIAL (CANNULA) ×2 IMPLANT
CATH CPB KIT HENDRICKSON (MISCELLANEOUS) ×5 IMPLANT
CATH HEART VENT LEFT (CATHETERS) ×4 IMPLANT
CATH ROBINSON RED A/P 18FR (CATHETERS) ×25 IMPLANT
CLIP APPLIE 9.375 SM OPEN (CLIP) ×4 IMPLANT
CLIP RETRACTION 3.0MM CORONARY (MISCELLANEOUS) ×5 IMPLANT
CLIP TI MEDIUM 24 (CLIP) ×2 IMPLANT
CLIP VESOCCLUDE MED 24/CT (CLIP) IMPLANT
CLIP VESOCCLUDE SM WIDE 24/CT (CLIP) IMPLANT
CNTNR URN SCR LID CUP LEK RST (MISCELLANEOUS) ×4 IMPLANT
CONN ST 1/2X1/2  BEN (MISCELLANEOUS) ×10
CONN ST 1/2X1/2 BEN (MISCELLANEOUS) ×8 IMPLANT
CONN ST 1/4X3/8  BEN (MISCELLANEOUS) ×10
CONN ST 1/4X3/8 BEN (MISCELLANEOUS) ×8 IMPLANT
CONNECTOR BLAKE 2:1 CARIO BLK (MISCELLANEOUS) ×5 IMPLANT
CONT SPEC 4OZ STRL OR WHT (MISCELLANEOUS) ×5
CONTAINER PROTECT SURGISLUSH (MISCELLANEOUS) ×20 IMPLANT
COR-KNOT MINI DEVICE ×2 IMPLANT
COR-KNOT QUICK LOAD UNIT ×2 IMPLANT
COVER MAYO STAND STRL (DRAPES) ×5 IMPLANT
COVER SURGICAL LIGHT HANDLE (MISCELLANEOUS) ×5 IMPLANT
CUFF TOURN SGL QUICK 18X4 (TOURNIQUET CUFF) IMPLANT
CUFF TOURN SGL QUICK 24 (TOURNIQUET CUFF)
CUFF TRNQT CYL 24X4X16.5-23 (TOURNIQUET CUFF) IMPLANT
DERMABOND ADVANCED (GAUZE/BANDAGES/DRESSINGS) ×1
DERMABOND ADVANCED .7 DNX12 (GAUZE/BANDAGES/DRESSINGS) ×1 IMPLANT
DEVICE SUT CK QUICK LOAD MINI (SUTURE) ×2 IMPLANT
DRAIN CHANNEL 19F RND (DRAIN) ×15 IMPLANT
DRAIN CHANNEL 28F RND 3/8 FF (WOUND CARE) ×6 IMPLANT
DRAIN CONNECTOR BLAKE 1:1 (MISCELLANEOUS) ×7 IMPLANT
DRAPE CARDIOVASCULAR INCISE (DRAPES) ×10
DRAPE EXTREMITY T 121X128X90 (DISPOSABLE) ×5 IMPLANT
DRAPE HALF SHEET 40X57 (DRAPES) ×5 IMPLANT
DRAPE INCISE IOBAN 66X45 STRL (DRAPES) IMPLANT
DRAPE SRG 135X102X78XABS (DRAPES) ×8 IMPLANT
DRAPE WARM FLUID 44X44 (DRAPES) ×10 IMPLANT
DRSG AQUACEL AG ADV 3.5X10 (GAUZE/BANDAGES/DRESSINGS) ×5 IMPLANT
DRSG AQUACEL AG ADV 3.5X14 (GAUZE/BANDAGES/DRESSINGS) ×5 IMPLANT
DRSG COVADERM 4X14 (GAUZE/BANDAGES/DRESSINGS) ×10 IMPLANT
ELECT BLADE 4.0 EZ CLEAN MEGAD (MISCELLANEOUS) ×5
ELECT CAUTERY BLADE 6.4 (BLADE) ×5 IMPLANT
ELECT REM PT RETURN 9FT ADLT (ELECTROSURGICAL) ×20
ELECTRODE BLDE 4.0 EZ CLN MEGD (MISCELLANEOUS) ×4 IMPLANT
ELECTRODE REM PT RTRN 9FT ADLT (ELECTROSURGICAL) ×16 IMPLANT
FELT TEFLON 1X6 (MISCELLANEOUS) ×20 IMPLANT
GAUZE 4X4 16PLY ~~LOC~~+RFID DBL (SPONGE) ×10 IMPLANT
GAUZE SPONGE 4X4 12PLY STRL (GAUZE/BANDAGES/DRESSINGS) ×13 IMPLANT
GEL ULTRASOUND 20GR AQUASONIC (MISCELLANEOUS) ×5 IMPLANT
GLOVE BIO SURGEON STRL SZ 6 (GLOVE) IMPLANT
GLOVE BIO SURGEON STRL SZ 6.5 (GLOVE) IMPLANT
GLOVE BIO SURGEON STRL SZ7 (GLOVE) ×25 IMPLANT
GLOVE BIO SURGEON STRL SZ7.5 (GLOVE) IMPLANT
GLOVE BIOGEL M STRL SZ7.5 (GLOVE) ×10 IMPLANT
GLOVE EUDERMIC 7 POWDERFREE (GLOVE) ×10 IMPLANT
GOWN STRL REUS W/ TWL LRG LVL3 (GOWN DISPOSABLE) ×32 IMPLANT
GOWN STRL REUS W/ TWL XL LVL3 (GOWN DISPOSABLE) ×16 IMPLANT
GOWN STRL REUS W/TWL LRG LVL3 (GOWN DISPOSABLE) ×40
GOWN STRL REUS W/TWL XL LVL3 (GOWN DISPOSABLE) ×20
HEMOSTAT POWDER SURGIFOAM 1G (HEMOSTASIS) ×22 IMPLANT
HEMOSTAT SURGICEL 2X14 (HEMOSTASIS) ×5 IMPLANT
INSERT FOGARTY XLG (MISCELLANEOUS) ×2 IMPLANT
INSERT SUTURE HOLDER (MISCELLANEOUS) ×10 IMPLANT
KIT BASIN OR (CUSTOM PROCEDURE TRAY) ×10 IMPLANT
KIT SUCTION CATH 14FR (SUCTIONS) ×10 IMPLANT
KIT SUT CK MINI COMBO 4X17 (SUTURE) ×2 IMPLANT
KIT TURNOVER KIT B (KITS) ×10 IMPLANT
KIT VASOVIEW HEMOPRO 2 VH 4000 (KITS) ×5 IMPLANT
LEAD PACING MYOCARDI (MISCELLANEOUS) ×7 IMPLANT
LINE VENT (MISCELLANEOUS) ×2 IMPLANT
MARKER GRAFT CORONARY BYPASS (MISCELLANEOUS) ×15 IMPLANT
NDL SUT 1 .5 CRC FRENCH EYE (NEEDLE) ×1 IMPLANT
NEEDLE FRENCH EYE (NEEDLE) ×5
NS IRRIG 1000ML POUR BTL (IV SOLUTION) ×55 IMPLANT
PACK ACCESSORY CANNULA KIT (KITS) ×5 IMPLANT
PACK E OPEN HEART (SUTURE) ×10 IMPLANT
PACK OPEN HEART (CUSTOM PROCEDURE TRAY) ×10 IMPLANT
PAD ARMBOARD 7.5X6 YLW CONV (MISCELLANEOUS) ×20 IMPLANT
PAD ELECT DEFIB RADIOL ZOLL (MISCELLANEOUS) ×5 IMPLANT
PENCIL BUTTON HOLSTER BLD 10FT (ELECTRODE) ×5 IMPLANT
POSITIONER HEAD DONUT 9IN (MISCELLANEOUS) ×10 IMPLANT
PUNCH AORTIC ROTATE 4.0MM (MISCELLANEOUS) ×5 IMPLANT
SET MPS 3-ND DEL (MISCELLANEOUS) ×2 IMPLANT
SHEARS HARMONIC 9CM CVD (BLADE) ×5 IMPLANT
SPONGE T-LAP 18X18 ~~LOC~~+RFID (SPONGE) ×40 IMPLANT
STRIP CLOSURE SKIN 1/2X4 (GAUZE/BANDAGES/DRESSINGS) ×2 IMPLANT
STRIP SURGICAL 1/4 X 6 IN (GAUZE/BANDAGES/DRESSINGS) ×2 IMPLANT
SUPPORT HEART JANKE-BARRON (MISCELLANEOUS) ×5 IMPLANT
SUT BONE WAX W31G (SUTURE) ×10 IMPLANT
SUT EB EXC GRN/WHT 2-0 V-5 (SUTURE) ×10 IMPLANT
SUT ETHIBOND X763 2 0 SH 1 (SUTURE) ×10 IMPLANT
SUT ETHILON 3 0 FSL (SUTURE) ×2 IMPLANT
SUT MNCRL AB 3-0 PS2 18 (SUTURE) ×10 IMPLANT
SUT MNCRL AB 4-0 PS2 18 (SUTURE) ×2 IMPLANT
SUT PDS AB 1 CTX 36 (SUTURE) ×10 IMPLANT
SUT PROLENE 3 0 SH DA (SUTURE) IMPLANT
SUT PROLENE 3 0 SH1 36 (SUTURE) ×5 IMPLANT
SUT PROLENE 4 0 RB 1 (SUTURE) ×35
SUT PROLENE 4 0 SH DA (SUTURE) ×5 IMPLANT
SUT PROLENE 4-0 RB1 .5 CRCL 36 (SUTURE) ×16 IMPLANT
SUT PROLENE 5 0 C 1 36 (SUTURE) ×15 IMPLANT
SUT PROLENE 7 0 BV 1 (SUTURE) ×6 IMPLANT
SUT PROLENE 7 0 BV1 MDA (SUTURE) ×7 IMPLANT
SUT STEEL 6MS V (SUTURE) ×10 IMPLANT
SUT VIC AB 2-0 CT1 27 (SUTURE) ×5
SUT VIC AB 2-0 CT1 TAPERPNT 27 (SUTURE) ×1 IMPLANT
SUT VIC AB 3-0 SH 27 (SUTURE)
SUT VIC AB 3-0 SH 27X BRD (SUTURE) IMPLANT
SUT VIC AB 3-0 X1 27 (SUTURE) IMPLANT
SYR 50ML SLIP (SYRINGE) IMPLANT
SYSTEM SAHARA CHEST DRAIN ATS (WOUND CARE) ×10 IMPLANT
TAPE PAPER 2X10 WHT MICROPORE (GAUZE/BANDAGES/DRESSINGS) ×2 IMPLANT
TOWEL GREEN STERILE (TOWEL DISPOSABLE) ×10 IMPLANT
TOWEL GREEN STERILE FF (TOWEL DISPOSABLE) ×10 IMPLANT
TRAY FOLEY SLVR 16FR TEMP STAT (SET/KITS/TRAYS/PACK) ×10 IMPLANT
TUBING LAP HI FLOW INSUFFLATIO (TUBING) ×5 IMPLANT
UNDERPAD 30X36 HEAVY ABSORB (UNDERPADS AND DIAPERS) ×10 IMPLANT
VALVE AORTIC SZ23 INSP/RESIL (Prosthesis & Implant Heart) ×2 IMPLANT
VENT LEFT HEART 12002 (CATHETERS) ×5
WATER STERILE IRR 1000ML POUR (IV SOLUTION) ×20 IMPLANT

## 2021-10-25 NOTE — Op Note (Signed)
[address]       [place] [address]             [phone]                                          10/25/2021 Patient:  Luis Lindsey Pre-Op Dx: Coronary artery disease   Moderate aortic stenosis   Peripheral vascular disease   Hypertension   Hyperlipidemia   Diabetes mellitus Post-op Dx: Same Procedure: CABG X 3.  LIMA to LAD, reverse saphenous vein graft to PDA, reverse saphenous vein graft to OM1. Coronary endarterectomy of the LAD Aortic valve replacement with a 23 mm Inspiris valve. Endoscopic greater saphenous vein harvest on the right   Surgeon and Role:      * Luis Lindsey, Luis Crater, MD - Primary    *E. Barrett, PA-C- assisting An experienced assistant was required given the complexity of this surgery and the standard of surgical care. The assistant was needed for exposure, dissection, suctioning, retraction of delicate tissues and sutures, instrument exchange and for overall help during this procedure.    Anesthesia  general EBL: 1 L Blood Administration: 1 unit of packed red blood cells Xclamp Time: 138 min Pump Time: 158 min  Drains: 88 F blake drain: R, L, mediastinal  Wires: Ventricular Counts: correct   Indications: 71 year old male with left main/three-vessel coronary artery disease, and mild to moderate aortic stenosis.  He has previously undergone a left lower extremity femoropopliteal bypass with PTFE.  He also has a history of strokes and TIAs.  We discussed the risks and benefits of surgical revascularization along with possible bioprosthetic aortic valve replacement.    Findings: LAD was heavily calcified.  An endarterectomy had to be performed.  The LIMA was a good conduit.  The vein was somewhat small.  The PDA was a good target.  The obtuse marginal was intramyocardial but a good target.  Aortic valve was moderately calcified.  Calcifications were most significant along the noncoronary cusp.  The valve was  trileaflet.  Operative Technique: All invasive lines were placed in pre-op holding.  After the risks, benefits and alternatives were thoroughly discussed, the patient was brought to the operative theatre.  Anesthesia was induced, and the patient was prepped and draped in normal sterile fashion.  An appropriate surgical pause was performed, and pre-operative antibiotics were dosed accordingly.  We began with simultaneous incisions along the right leg for harvesting of the greater saphenous vein and the chest for the sternotomy.  In regards to the sternotomy, this was carried down with bovie cautery, and the sternum was divided with a reciprocating saw.  Meticulous hemostasis was obtained.  The left internal thoracic artery was exposed and harvested in in pedicled fashion.  The patient was systemically heparinized, and the artery was divided distally, and placed in a papaverine sponge.    The sternal elevator was removed, and a retractor was placed.  The pericardium was divided in the midline and fashioned into a cradle with pericardial stitches.   After we confirmed an appropriate ACT, the ascending aorta was cannulated in standard fashion.  The right atrial appendage was used for venous cannulation site.  Cardiopulmonary bypass was initiated, and the heart retractor was placed. The cross clamp was applied, and a dose of anterograde cardioplegia was given with good arrest of the heart.  We moved to the posterior wall of the heart, and found a good target on the PDA.  An arteriotomy was made, and the vein graft was anastomosed to it in an end to side fashion.  Next we exposed the lateral wall, and found a good target on the obtuse marginal.  An end to side anastomosis with the vein graft was then created.  Finally, we exposed a target on the LAD.it was heavily calcified, thus an endarterectomy had to be performed.  We then fashioned an end to side anastomosis between it and the LITA.    Another dose of  anterograde cardioplegia was given with good arrest of the heart.  Our aortotomy was made and directed toward the non coronary cusp.  The valve was inspected.  The valve was strong leaflet.  It was moderately calcified with most of the calcifications along the noncoronary cusp.  All leaflets were excised.  The annulus was sized to a 23 mm Inspra's valve.  The left ventricle was then copiously irrigated.  Pledgeted mattress sutures were placed circumferentially through the annulus.  These sutures were then passed through the sewing ring of the valve.  Once the valve was seated in the annulus, it was secured with Core-knot sutures.  We began to rewarm, and close our aortotomy in 2 layers.  We created an end to side anastomosis between it and the proximal vein grafts.  Rings were placed on the proximal anastomosis.  A re-animation dose of cardioplegia was given.  After de-airing the heart, the aortic cross clamp was removed.  We checked our valve function, and for air using the TEE.    Hemostasis was obtained, and we separated from cardiopulmonary bypass without event.  The heparin was reversed with protamine.  Chest tubes and wires were placed, and the sternum was re-approximated with sternal wires.  The soft tissue and skin were re-approximated wth absorbable suture.    The patient tolerated the procedure without any immediate complications, and was transferred to the ICU in guarded condition.  Luis Lindsey

## 2021-10-25 NOTE — Anesthesia Procedure Notes (Signed)
Arterial Line Insertion Start/End5/31/2023 6:50 AM, 10/25/2021 7:00 AM Performed by: Darral Dash, DO, Dorthea Cove, CRNA, CRNA  Patient location: Pre-op. Preanesthetic checklist: patient identified, IV checked, site marked, risks and benefits discussed, surgical consent, monitors and equipment checked, pre-op evaluation, timeout performed and anesthesia consent Lidocaine 1% used for infiltration Right, radial was placed Catheter size: 20 G Hand hygiene performed  and maximum sterile barriers used   Attempts: 1 Procedure performed without using ultrasound guided technique. Following insertion, dressing applied and Biopatch. Post procedure assessment: normal and unchanged  Patient tolerated the procedure well with no immediate complications. Additional procedure comments: Procedure done by Raynelle Fanning under direct supervision.

## 2021-10-25 NOTE — Anesthesia Procedure Notes (Signed)
Procedure Name: Intubation Date/Time: 10/25/2021 7:56 AM Performed by: Dorthea Cove, CRNA Pre-anesthesia Checklist: Patient identified, Emergency Drugs available, Suction available, Patient being monitored and Timeout performed Patient Re-evaluated:Patient Re-evaluated prior to induction Oxygen Delivery Method: Non-rebreather mask Preoxygenation: Pre-oxygenation with 100% oxygen Induction Type: IV induction Ventilation: Mask ventilation without difficulty Laryngoscope Size: Mac and 4 Grade View: Grade I Tube type: Oral Tube size: 8.0 mm Number of attempts: 2 Airway Equipment and Method: Stylet Placement Confirmation: ETT inserted through vocal cords under direct vision Secured at: 24 cm Tube secured with: Tape Comments: Inserted by Orland Mustard SRNA

## 2021-10-25 NOTE — Consult Note (Signed)
NAME:  Luis Lindsey, MRN:  [id number], DOB:  11/12/1950, LOS: 0 ADMISSION DATE:  10/25/2021, CONSULTATION DATE: 10/25/2021 REFERRING MD: Dr. Kipp Brood, CHIEF COMPLAINT: Coronary artery disease s/p CABG  History of Present Illness:  71 year old male with a prior history of stroke diabetes, hyperlipidemia hypertension and coronary artery disease with moderate aortic stenosis who underwent CABG x3 and aortic valve replacement, remained intubated and was transferred to ICU.  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   Dysphagia    Esophageal dysmotility    GERD (gastroesophageal reflux disease)    Heart murmur    pt had recent echocardiogram   History of hiatal hernia    History of Holter monitoring 2009   History of kidney stones    HNP (herniated nucleus pulposus), lumbar    L4-5   HYPERCHOLESTEROLEMIA 11/30/2009   Hyperlipidemia    HYPERTENSION 11/30/2009   Kidney stones    Numbness and tingling    Peripheral vascular disease ([place])    Right carotid bruit    SMOKER 11/30/2009   Stroke ([place]) 2012   URINARY CALCULUS 11/30/2009     Significant Hospital Events: Including procedures, antibiotic start and stop dates in addition to other pertinent events     Interim History / Subjective:    Objective   Blood pressure (!) 105/57, pulse 60, temperature 97.6 F (36.4 C), temperature source Oral, resp. rate 12, height '5\' 10"'$  (1.778 m), weight 76.2 kg, SpO2 100 %. CVP:  [3 mmHg-5 mmHg] 5 mmHg CO:  [4.7 L/min] 4.7 L/min  Vent Mode: SIMV;PRVC;PSV FiO2 (%):  [50 %] 50 % Set Rate:  [12 bmp] 12 bmp Vt Set:  [580 mL] 580 mL PEEP:  [5 cmH20] 5 cmH20 Pressure Support:  [10 cmH20] 10 cmH20   Intake/Output Summary (Last 24  hours) at 10/25/2021 1558 Last data filed at 10/25/2021 1344 Gross per 24 hour  Intake 2684 ml  Output 1923 ml  Net 761 ml   Filed Weights   10/25/21 0600  Weight: 76.2 kg    Examination:  Physical exam: General: Crtitically ill-appearing male, orally intubated HEENT: [place]/AT, eyes anicteric.  ETT and OGT in place Neuro: Sedated, not following commands.  Eyes are closed.  Pupils 3 mm bilateral reactive to light Chest: Chest tube in place, small amount of draining bloody drainage, coarse breath sounds, no wheezes or rhonchi Heart: Bradycardic, no murmurs or gallops Abdomen: Soft, nontender, nondistended, bowel sounds present Skin: No rash   Resolved Hospital Problem list     Assessment & Plan:  Coronary artery disease s/p 3-vessel CABG Aortic stenosis status post aortic valve replacement Perioperative blood loss anemia Acute respiratory insufficiency, postop Hypertension Hyperlipidemia Poorly controlled diabetes  Continue aspirin and statin Chest tube management TCTS Continue to titrate Precedex with RASS goal 0/-1 Monitor H&H Rapid weaning protocol ordered is in place Continue pain control with tramadol, oxycodone and fentanyl Holding antihypertensive for now, as patient is requiring low-dose phenylephrine Continue Protonix Monitor urine output and labs Patient's hemoglobin A1c is 9.8 Continue insulin infusion, will switch it back to long-acting and sliding scale once extubated   Best Practice (right click and "Reselect all SmartList  Selections" daily)   Diet/type: NPO DVT prophylaxis: SCD GI prophylaxis: PPI Lines: Central line and yes and it is still needed Foley:  Yes, and it is still needed Code Status:  full code Last date of multidisciplinary goals of care discussion [Per primary team]  Labs   CBC: Recent Labs  Lab 10/24/21 1159 10/25/21 0805 10/25/21 1230 10/25/21 1234 10/25/21 1331 10/25/21 1334 10/25/21 1440  WBC 6.1  --   --   --   --   --   8.7  HGB 10.2*   < > 6.1* 6.8* 8.5* 8.8* 8.6*  HCT 33.0*   < > 19.4* 20.0* 25.0* 26.0* 25.8*  MCV 93.2  --   --   --   --   --  90.2  PLT 316  --  233  --   --   --  194   < > = values in this interval not displayed.    Basic Metabolic Panel: Recent Labs  Lab 10/24/21 1159 10/25/21 0805 10/25/21 0808 10/25/21 1017 10/25/21 1044 10/25/21 1119 10/25/21 1220 10/25/21 1234 10/25/21 1331 10/25/21 1334  NA 134* 139   < > 138   < > 137 138 138 139 141  K 4.1 3.5   < > 3.4*   < > 4.0 3.8 4.3 4.0 4.1  CL 106 105  --  103  --  103 103  --  105  --   CO2 19*  --   --   --   --   --   --   --   --   --   GLUCOSE 237* 83  --  78  --  75 74  --  87  --   BUN 20 16  --  16  --  16 14  --  14  --   CREATININE 0.90 0.60*  --  0.50*  --  0.50* 0.50*  --  0.60*  --   CALCIUM 9.0  --   --   --   --   --   --   --   --   --    < > = values in this interval not displayed.   GFR: Estimated Creatinine Clearance: 88.7 mL/min (A) (by C-G formula based on SCr of 0.6 mg/dL (L)). Recent Labs  Lab 10/24/21 1159 10/25/21 1440  WBC 6.1 8.7    Liver Function Tests: Recent Labs  Lab 10/24/21 1159  AST 15  ALT 16  ALKPHOS 77  BILITOT 0.5  PROT 6.7  ALBUMIN 3.3*   No results for input(s): LIPASE, AMYLASE in the last 168 hours. No results for input(s): AMMONIA in the last 168 hours.  ABG    Component Value Date/Time   PHART 7.382 10/25/2021 1334   PCO2ART 38.0 10/25/2021 1334   PO2ART 112 (H) 10/25/2021 1334   HCO3 22.6 10/25/2021 1334   TCO2 24 10/25/2021 1334   ACIDBASEDEF 2.0 10/25/2021 1334   O2SAT 98 10/25/2021 1334     Coagulation Profile: Recent Labs  Lab 10/24/21 1159 10/25/21 1440  INR 1.1 1.4*    Cardiac Enzymes: No results for input(s): CKTOTAL, CKMB, CKMBINDEX, TROPONINI in the last 168 hours.  HbA1C: Hgb A1c MFr Bld  Date/Time Value Ref Range Status  10/24/2021 11:59 AM 9.8 (H) 4.8 - 5.6 % Final    Comment:    (NOTE) Pre diabetes:           5.7%-6.4%  Diabetes:              >  6.4%  Glycemic control for   <7.0% adults with diabetes   06/05/2021 12:23 PM 9.7 (H) 4.8 - 5.6 % Final    Comment:    (NOTE) Pre diabetes:          5.7%-6.4%  Diabetes:              >6.4%  Glycemic control for   <7.0% adults with diabetes     CBG: Recent Labs  Lab 10/24/21 1033 10/25/21 0605 10/25/21 0942 10/25/21 1441  GLUCAP 270* 96 74 83    Review of Systems:   Unable to obtain as patient is intubated and sedated  Past Medical History:  He,  has a past medical history of Anemia, Aortic stenosis, Arthritis, BCC (basal cell carcinoma), BPH (benign prostatic hyperplasia), CAROTID BRUIT, RIGHT (11/30/2009), Dizziness, DM (11/30/2009), Dysphagia, Esophageal dysmotility, GERD (gastroesophageal reflux disease), Heart murmur, History of hiatal hernia, History of Holter monitoring (2009), History of kidney stones, HNP (herniated nucleus pulposus), lumbar, HYPERCHOLESTEROLEMIA (11/30/2009), Hyperlipidemia, HYPERTENSION (11/30/2009), Kidney stones, Numbness and tingling, Peripheral vascular disease ([place]), Right carotid bruit, SMOKER (11/30/2009), Stroke ([place]) (2012), and URINARY CALCULUS (11/30/2009).   Surgical History:   Past Surgical History:  Procedure Laterality Date   ABDOMINAL AORTOGRAM W/LOWER EXTREMITY N/A 06/18/2018   Procedure: ABDOMINAL AORTOGRAM W/LOWER EXTREMITY;  Surgeon: Marty Heck, MD;  Location: [facility];  Service: Cardiovascular;  Laterality: N/A;   ABDOMINAL AORTOGRAM W/LOWER EXTREMITY N/A 12/02/2019   Procedure: ABDOMINAL AORTOGRAM W/LOWER EXTREMITY;  Surgeon: Marty Heck, MD;  Location: [facility];  Service: Cardiovascular;  Laterality: N/A;   ABDOMINAL AORTOGRAM W/LOWER EXTREMITY N/A 03/23/2021   Procedure: ABDOMINAL AORTOGRAM W/LOWER EXTREMITY;  Surgeon: Marty Heck, MD;  Location: [facility];  Service: Cardiovascular;  Laterality: N/A;   arthroscopy of right shoulder  Right    Jan 2019, June 2019-- done at [facility], Dr. Ninfa Linden   BACK SURGERY     CATARACT EXTRACTION W/ INTRAOCULAR LENS  IMPLANT, BILATERAL     CYSTOSCOPY     ENDARTERECTOMY Right 08/24/2019   Procedure: ENDARTERECTOMY CAROTID;  Surgeon: Marty Heck, MD;  Location: [facility];  Service: Vascular;  Laterality: Right;   ENDARTERECTOMY FEMORAL Left 06/05/2021   Procedure: ENDARTERECTOMY FEMORAL WITH PROFUNDAPLASTY;  Surgeon: Marty Heck, MD;  Location: [facility];  Service: Vascular;  Laterality: Left;   FACIAL COSMETIC SURGERY  2014   FEMORAL-POPLITEAL BYPASS GRAFT Left 06/05/2021   Procedure: LEFT FEMORAL-POPLITEAL ARTERY BYPASS GRAFTING USING 29m PROPATEN VASCULAR REMOVABLE RING GRAFT;  Surgeon: CMarty Heck MD;  [facility] [facility]  Service: Vascular;  Laterality: Left;   FINGER SURGERY  2002   LEFT HEART CATH AND CORONARY ANGIOGRAPHY N/A 10/05/2021   Procedure: LEFT HEART CATH AND CORONARY ANGIOGRAPHY;  Surgeon: ENelva Bush MD;  [facility];  Service: Cardiovascular;  Laterality: N/A;   LUMBAR LAMINECTOMY/DECOMPRESSION MICRODISCECTOMY Left 08/01/2018   Procedure: Microdiscectomy - Lumbar four-Lumbar five - left;  Surgeon: JEustace Moore MD;  [facility]  Service: Neurosurgery;  Laterality: Left;   LUMBAR LAMINECTOMY/DECOMPRESSION MICRODISCECTOMY Left 10/15/2018   Procedure: Re-do Microdiscectomy - left - Lumbar four-Lumbar five;  Surgeon: JEustace Moore MD;  [facility]  Service: Neurosurgery;  Laterality: Left;   NECK SURGERY  1986   PATCH ANGIOPLASTY Left 06/05/2021   Procedure: PATCH ANGIOPLASTY USING XRueben BashBIOLOGIC PATCH;  Surgeon: CMarty Heck MD;  [facility]  Service: Vascular;  Laterality: Left;  PERIPHERAL VASCULAR ATHERECTOMY  12/02/2019   Procedure: PERIPHERAL VASCULAR ATHERECTOMY;  Surgeon: Marty Heck, MD;  Location: [facility];  Service: Cardiovascular;;  left SFA   PERIPHERAL VASCULAR  INTERVENTION  06/18/2018   Procedure: PERIPHERAL VASCULAR INTERVENTION;  Surgeon: Marty Heck, MD;  Location: [facility];  Service: Cardiovascular;;  Left external iliac   ROTATOR CUFF REPAIR Left    UMBILICAL HERNIA REPAIR       Social History:   reports that he quit smoking about 5 years ago. His smoking use included cigarettes. He has never used smokeless tobacco. He reports that he does not drink alcohol and does not use drugs.   Family History:  His family history includes CVA in his father; Coronary artery disease in his brother, brother, father, and mother; Diabetes in his mother and sister; Diabetes Mellitus I in his mother and sister; Heart disease in his brother, father, and mother; Stroke in his father.   Allergies Allergies  Allergen Reactions   Codeine Nausea Only     Home Medications  Prior to Admission medications   Medication Sig Start Date End Date Taking? Authorizing Provider  amLODipine-benazepril (LOTREL) 10-40 MG per capsule Take 1 capsule by mouth daily.   Yes [provider]  aspirin EC 81 MG EC tablet Take 1 tablet (81 mg total) by mouth daily at 6 (six) AM. 08/26/19  Yes Dagoberto Ligas, PA-C  carvedilol (COREG) 12.5 MG tablet Take 1 tablet (12.5 mg total) by mouth 2 (two) times daily. 08/28/21  Yes Freada Bergeron, MD  clopidogrel (PLAVIX) 75 MG tablet TAKE 1 TABLET BY MOUTH EVERY DAY 10/03/21  Yes Waynetta Sandy, MD  ezetimibe (ZETIA) 10 MG tablet Take 1 tablet (10 mg total) by mouth daily. 06/07/21  Yes Dagoberto Ligas, PA-C  ferrous sulfate 325 (65 FE) MG tablet Take 325 mg by mouth daily with breakfast.   Yes [provider]  insulin degludec (TRESIBA) 200 UNIT/ML FlexTouch Pen Inject 66 Units into the skin in the morning.   Yes [provider]  metFORMIN (GLUCOPHAGE-XR) 500 MG 24 hr tablet Take 2 tablets (1,000 mg total) by mouth 2 (two) times daily. 10/08/21  Yes End, Harrell Gave, MD  nitroGLYCERIN  (NITROSTAT) 0.4 MG SL tablet Place 1 tablet (0.4 mg total) under the tongue every 5 (five) minutes as needed for chest pain. 10/05/21 10/05/22 Yes End, Harrell Gave, MD  pantoprazole (PROTONIX) 40 MG tablet Take 40 mg by mouth daily with breakfast.  07/02/18  Yes [provider]  pioglitazone (ACTOS) 45 MG tablet TAKE 1 TABLET DAILY (NEED APPOINTMENT FOR FURTHER REFILLS) 04/28/14  Yes Renato Shin, MD  rosuvastatin (CRESTOR) 40 MG tablet Take 40 mg by mouth daily.    Yes [provider]  Continuous Blood Gluc Sensor (FREESTYLE LIBRE 2 SENSOR) MISC AS DIRECTED FOR BLOOD SUGAR CHECK DX E11.42 12/05/20   [provider]  ONETOUCH VERIO test strip 1 each 3 (three) times daily. 10/29/19   [provider]     Critical care time:     Total critical care time: 34 minutes  Performed by: [place] care time was exclusive of separately billable procedures and treating other patients.   Critical care was necessary to treat or prevent imminent or life-threatening deterioration.   Critical care was time spent personally by me on the following activities: development of treatment plan with patient and/or surrogate as well as nursing, discussions with consultants, evaluation of patient's response  to treatment, examination of patient, obtaining history from patient or surrogate, ordering and performing treatments and interventions, ordering and review of laboratory studies, ordering and review of radiographic studies, pulse oximetry and re-evaluation of patient's condition.   Jacky Kindle MD [facility] See Amion for pager If no response to pager, please call [phone] until 7pm After 7pm, Please call E-link [phone]

## 2021-10-25 NOTE — Progress Notes (Signed)
[facility] Progress Note Patient Name: Luis Lindsey DOB: 01/04/1951 MRN: [id number]   Date of Service  10/25/2021  HPI/Events of Note  Hyperglycemia - Blood glucose = 146. History of DM - Type II. Insulin IV infusion now off d/t hypoglycemia.   eICU Interventions  Plan: Q 4 hour moderate Novolog SSI.     Intervention Category Major Interventions: Hyperglycemia - active titration of insulin therapy  Kendell Sagraves [place] 10/25/2021, 10:02 PM

## 2021-10-25 NOTE — Hospital Course (Addendum)
History of Present Illness:  Luis Lindsey is a 71 yo male with history of PAD S/P L SFA atherectomy, Carotid Artery Stenosis S/P L CEA, HTN, HLD, tobacco abuse, and aortic stenosis.  The patient developed lightheadedness back in 2021.  This occurred in the setting of anemia and hypotension.  He has been followed by Cardiology since that time.  [name] recently developed complaints of chest pain.  CTA of the chest obtained in May of this year showed an elevated calcium score concerning for CAD.  He underwent Echocardiogram in April which showed stable mild to moderate Aortic valve stenosis.  He underwent cardiac catheterization on 10/05/2021 which showed multivessel CAD.  It was felt the patient would benefit from coronary bypass grafting and replacement of his Aortic Valve and he was referred to [facility].  He was evaluated by Dr. Kipp Brood at which time the patient admitted to having strokes and TIAs in the past.  He occasionally has chest pain and shortness of breath that sometimes occur at rest.  It was felt the patient would require surgical intervention consisting of Coronary artery bypass grafting and Aortic Valve Replacement.  The patient required dental clearance prior to the procedure.  The risks and benefits of the procedure were explained to the patient and he was agreeable to proceed.  Hospital Course:  Luis Lindsey presented to [facility] on 10/25/2021.  The patient was taken to the operating room and underwent CABG x 3 utilizing LIMA to LAD,  SVG to PDA, and SVG to OM1.  He also underwent Aortic Valve Replacement with a 23 mm Edwards Resilia Valve.  Finally he underwent endoscopic harvest of greater saphenous vein from the right leg.  Postoperative hospital course:  The patient was weaned from the ventilator using standard post cardiac surgical protocols without difficulty.  He does have an expected acute blood loss anemia and has required transfusion.  He has  required Neo-Synephrine as well as backup pacing in the early postoperative period renal function has remained normal.  These were subsequently discontinued over time.  Critical care medicine team has assisted with postoperative management during his ICU portion of his hospitalization.  He does have some expected postoperative volume overload and is being treated with a course of diuretics.  Oxygen is being weaned over time with usual pulmonary hygiene measures as well as routine cardiac rehab modalities.  He was initially in a junctional rhythm alternating with complete heart block.  Beta blocking agents were withheld.  He had recovery of his conduction system with return of sinus rhythm by the third postoperative day.  Glucose was monitored and sliding scale insulin was provided as required.  He will be transition to his home medications for diabetes at discharge.  He was mobilized and made good progress with ambulation.  Incisions are healing well without evidence of infection.  He is maintaining sinus rhythm with a first-degree AV block at time of discharge.  He has some elevated blood pressure readings and has been resumed on his Lotrel.  Additionally HCTZ was added.  He may require further adjustments as his blood pressure was not well controlled at home.  Overall at the time of discharge she is felt to be quite stable.

## 2021-10-25 NOTE — Interval H&P Note (Signed)
History and Physical Interval Note:  10/25/2021 7:30 AM  Luis Lindsey  has presented today for surgery, with the diagnosis of CAD, AS.  The various methods of treatment have been discussed with the patient and family. After consideration of risks, benefits and other options for treatment, the patient has consented to  Procedure(s): CORONARY ARTERY BYPASS GRAFTING (CABG) (N/A) AORTIC VALVE REPLACEMENT (AVR) (N/A) LEFT RADIAL ARTERY HARVEST (Left) TRANSESOPHAGEAL ECHOCARDIOGRAM (TEE) (N/A) as a surgical intervention.  The patient's history has been reviewed, patient examined, no change in status, stable for surgery.  I have reviewed the patient's chart and labs.  Questions were answered to the patient's satisfaction.     Gicela Schwarting [name]

## 2021-10-25 NOTE — Progress Notes (Signed)
  Echocardiogram Echocardiogram Transesophageal has been performed.  Fidel Levy 10/25/2021, 8:12 AM

## 2021-10-25 NOTE — Progress Notes (Signed)
Patient ID: Luis Lindsey, male   DOB: [birth year], 71 y.o.   MRN: [id number]  TCTS Evening Rounds:   Hemodynamically stable  CI = 2.6  Has started to wake up on vent.   Urine output good  CT output low  CBC    Component Value Date/Time   WBC 8.7 10/25/2021 1440   RBC 2.86 (L) 10/25/2021 1440   HGB 8.6 (L) 10/25/2021 1440   HGB 10.3 (L) 10/03/2021 1522   HCT 25.8 (L) 10/25/2021 1440   HCT 31.5 (L) 10/03/2021 1522   PLT 194 10/25/2021 1440   PLT 294 10/03/2021 1522   MCV 90.2 10/25/2021 1440   MCV 88 10/03/2021 1522   MCH 30.1 10/25/2021 1440   MCHC 33.3 10/25/2021 1440   RDW 14.9 10/25/2021 1440   RDW 14.9 10/03/2021 1522   LYMPHSABS 1.5 12/05/2020 1000   MONOABS 0.7 12/05/2020 1000   EOSABS 0.1 12/05/2020 1000   BASOSABS 0.0 12/05/2020 1000     BMET    Component Value Date/Time   NA 141 10/25/2021 1334   NA 136 10/03/2021 1522   K 4.1 10/25/2021 1334   CL 105 10/25/2021 1331   CO2 19 (L) 10/24/2021 1159   GLUCOSE 87 10/25/2021 1331   BUN 14 10/25/2021 1331   BUN 18 10/03/2021 1522   CREATININE 0.60 (L) 10/25/2021 1331   CALCIUM 9.0 10/24/2021 1159   EGFR 71 10/03/2021 1522   GFRNONAA >60 10/24/2021 1159     A/P:  Stable postop course. Continue current plans

## 2021-10-25 NOTE — Transfer of Care (Signed)
Immediate Anesthesia Transfer of Care Note  Patient: Luis Lindsey  Procedure(s) Performed: CORONARY ARTERY BYPASS GRAFTING (CABG) X 3 USING LEFT INTERNAL MAMMARY ARTERY AND RIGHT GREATER SAPHENOUS VEIN (Chest) AORTIC VALVE REPLACEMENT (AVR)USING 23MM INSPIRIS RESILIA  AORTIC VALVE (Chest) TRANSESOPHAGEAL ECHOCARDIOGRAM (TEE) ENDOVEIN HARVEST OF GREATER SAPHENOUS VEIN (Right)  Patient Location: ICU  Anesthesia Type:General  Level of Consciousness: sedated and Patient remains intubated per anesthesia plan  Airway & Oxygen Therapy: Patient remains intubated per anesthesia plan and Patient placed on Ventilator (see vital sign flow sheet for setting)  Post-op Assessment: Report given to RN and Post -op Vital signs reviewed and stable  Post vital signs: Reviewed and stable  Last Vitals:  Vitals Value Taken Time  BP 115/64   Temp    Pulse 62   Resp 12   SpO2 100     Last Pain:  Vitals:   10/25/21 0608  TempSrc:   PainSc: 0-No pain         Complications: No notable events documented.

## 2021-10-25 NOTE — Anesthesia Procedure Notes (Signed)
  Central Venous Catheter Insertion Performed by: Darral Dash, DO, anesthesiologist Start/End5/31/2023 6:54 AM, 10/25/2021 7:05 AM Patient location: Pre-op. Preanesthetic checklist: patient identified, IV checked, site marked, risks and benefits discussed, surgical consent, monitors and equipment checked, pre-op evaluation, timeout performed and anesthesia consent Lidocaine 1% used for infiltration and patient sedated Hand hygiene performed  and maximum sterile barriers used  Catheter size: 8.5 Fr Central line was placed.Sheath introducer Procedure performed using ultrasound guided technique. Ultrasound Notes:anatomy identified, needle tip was noted to be adjacent to the nerve/plexus identified, no ultrasound evidence of intravascular and/or intraneural injection and image(s) printed for medical record Attempts: 1 Following insertion, line sutured, dressing applied and Biopatch. Post procedure assessment: blood return through all ports, free fluid flow and no air  Patient tolerated the procedure well with no immediate complications. Additional procedure comments: - triple lumen placed through introducer port for additional access without issue. All ports blood withdrawn and flushed appropriately. [name]

## 2021-10-25 NOTE — Procedures (Signed)
Extubation Procedure Note  Patient Details:   Name: Luis Lindsey DOB: 1950/10/25 MRN: [id number]   Airway Documentation:  Airway 8 mm (Active)  Secured at (cm) 25 cm 10/25/21 1430  Measured From Lips 10/25/21 1430  Secured Location Right 10/25/21 1422  Secured By Pink Tape 10/25/21 1422  Prone position No 10/25/21 1422  Site Condition Dry 10/25/21 1422   Vent end date: 10/25/21 Vent end time: 1957   Evaluation  O2 sats: stable throughout Complications: No apparent complications Patient did tolerate procedure well. Bilateral Breath Sounds: Rhonchi, Diminished  NIF= -40 VC=1.00 average of three attempts. No upper airway strider heard. Placed patient on [place] at 4lpm.   Yes  Ulice Dash 10/25/2021, 8:01 PM

## 2021-10-26 ENCOUNTER — Inpatient Hospital Stay (HOSPITAL_COMMUNITY): Payer: HMO

## 2021-10-26 ENCOUNTER — Encounter (HOSPITAL_COMMUNITY): Payer: Self-pay | Admitting: Thoracic Surgery (Cardiothoracic Vascular Surgery)

## 2021-10-26 DIAGNOSIS — Z952 Presence of prosthetic heart valve: Secondary | ICD-10-CM | POA: Diagnosis not present

## 2021-10-26 DIAGNOSIS — I2510 Atherosclerotic heart disease of native coronary artery without angina pectoris: Secondary | ICD-10-CM | POA: Diagnosis not present

## 2021-10-26 DIAGNOSIS — J9589 Other postprocedural complications and disorders of respiratory system, not elsewhere classified: Secondary | ICD-10-CM | POA: Diagnosis not present

## 2021-10-26 LAB — GLUCOSE, CAPILLARY
Glucose-Capillary: 103 mg/dL — ABNORMAL HIGH (ref 70–99)
Glucose-Capillary: 114 mg/dL — ABNORMAL HIGH (ref 70–99)
Glucose-Capillary: 120 mg/dL — ABNORMAL HIGH (ref 70–99)
Glucose-Capillary: 181 mg/dL — ABNORMAL HIGH (ref 70–99)
Glucose-Capillary: 232 mg/dL — ABNORMAL HIGH (ref 70–99)
Glucose-Capillary: 241 mg/dL — ABNORMAL HIGH (ref 70–99)

## 2021-10-26 LAB — CBC
HCT: 23.1 % — ABNORMAL LOW (ref 39.0–52.0)
HCT: 26.5 % — ABNORMAL LOW (ref 39.0–52.0)
Hemoglobin: 7.5 g/dL — ABNORMAL LOW (ref 13.0–17.0)
Hemoglobin: 8.7 g/dL — ABNORMAL LOW (ref 13.0–17.0)
MCH: 29.3 pg (ref 26.0–34.0)
MCH: 28.9 pg (ref 26.0–34.0)
MCHC: 32.5 g/dL (ref 30.0–36.0)
MCHC: 32.8 g/dL (ref 30.0–36.0)
MCV: 90.2 fL (ref 80.0–100.0)
MCV: 88 fL (ref 80.0–100.0)
Platelets: 219 10*3/uL (ref 150–400)
Platelets: 164 10*3/uL (ref 150–400)
RBC: 2.56 MIL/uL — ABNORMAL LOW (ref 4.22–5.81)
RBC: 3.01 MIL/uL — ABNORMAL LOW (ref 4.22–5.81)
RDW: 15.6 % — ABNORMAL HIGH (ref 11.5–15.5)
RDW: 17.4 % — ABNORMAL HIGH (ref 11.5–15.5)
WBC: 5.3 10*3/uL (ref 4.0–10.5)
WBC: 7.6 10*3/uL (ref 4.0–10.5)
nRBC: 0 % (ref 0.0–0.2)
nRBC: 0 % (ref 0.0–0.2)

## 2021-10-26 LAB — MAGNESIUM
Magnesium: 2.3 mg/dL (ref 1.7–2.4)
Magnesium: 2.2 mg/dL (ref 1.7–2.4)

## 2021-10-26 LAB — BASIC METABOLIC PANEL
Anion gap: 5 (ref 5–15)
Anion gap: 6 (ref 5–15)
BUN: 15 mg/dL (ref 8–23)
BUN: 23 mg/dL (ref 8–23)
CO2: 20 mmol/L — ABNORMAL LOW (ref 22–32)
CO2: 21 mmol/L — ABNORMAL LOW (ref 22–32)
Calcium: 7.8 mg/dL — ABNORMAL LOW (ref 8.9–10.3)
Calcium: 7.9 mg/dL — ABNORMAL LOW (ref 8.9–10.3)
Chloride: 113 mmol/L — ABNORMAL HIGH (ref 98–111)
Chloride: 108 mmol/L (ref 98–111)
Creatinine, Ser: 0.89 mg/dL (ref 0.61–1.24)
Creatinine, Ser: 1.16 mg/dL (ref 0.61–1.24)
GFR, Estimated: >60 mL/min (ref >60)
GFR, Estimated: >60 mL/min (ref >60)
Glucose, Bld: 125 mg/dL — ABNORMAL HIGH (ref 70–99)
Glucose, Bld: 262 mg/dL — ABNORMAL HIGH (ref 70–99)
Potassium: 3.9 mmol/L (ref 3.5–5.1)
Potassium: 4.2 mmol/L (ref 3.5–5.1)
Sodium: 138 mmol/L (ref 135–145)
Sodium: 135 mmol/L (ref 135–145)

## 2021-10-26 LAB — PREPARE RBC (CROSSMATCH)

## 2021-10-26 MED ORDER — ENOXAPARIN SODIUM 40 MG/0.4ML IJ SOSY
40.0000 mg | PREFILLED_SYRINGE | Freq: Every day | INTRAMUSCULAR | Status: DC
Start: 2021-10-26 — End: 2021-10-31
  Administered 2021-10-26 – 2021-10-30 (×5): 40 mg via SUBCUTANEOUS
  Filled 2021-10-26 (×5): qty 0.4

## 2021-10-26 MED ORDER — INSULIN ASPART 100 UNIT/ML IJ SOLN
0.0000 [IU] | INTRAMUSCULAR | Status: DC
  Administered 2021-10-26: 8 [IU] via SUBCUTANEOUS
  Administered 2021-10-26: 4 [IU] via SUBCUTANEOUS
  Administered 2021-10-26: 8 [IU] via SUBCUTANEOUS

## 2021-10-26 NOTE — Brief Op Note (Addendum)
10/25/2021  9:35 AM  PATIENT:  Luis Lindsey  71 y.o. male  PRE-OPERATIVE DIAGNOSIS:  CAD, AS  POST-OPERATIVE DIAGNOSIS:  * No post-op diagnosis entered *  PROCEDURE:  Procedure(s):  CORONARY ARTERY BYPASS GRAFTING X 3  -LIMA to LAD ( with Endarterectomy) -SVG to PDA -SVG to OM1  AORTIC VALVE REPLACEMENT  -23MM INSPIRIS RESILIA  AORTIC VALVE (N/A)  TRANSESOPHAGEAL ECHOCARDIOGRAM (TEE) (N/A)  ENDOVEIN HARVEST OF GREATER SAPHENOUS VEIN (Right) Vein harvest time: 45 min Vein prep time: 15 min  SURGEON:  Surgeon(s) and Role:    * Lightfoot, Lucile Crater, MD - Primary  PHYSICIAN ASSISTANT: Ellwood Handler PA-C    ASSISTANTS: Vernie Murders RNFA   ANESTHESIA:   general  EBL:  693 mL   BLOOD ADMINISTERED:   CC CELLSAVER  DRAINS:  Left and Right Pleural Chest Tubes, Mediastinal Chest Drains    LOCAL MEDICATIONS USED:  NONE  SPECIMEN:  Source of Specimen:  Aortic Valve Leaflets  DISPOSITION OF SPECIMEN:  PATHOLOGY  COUNTS:  YES  TOURNIQUET:  * No tourniquets in log *  DICTATION: .Dragon Dictation  PLAN OF CARE: Admit to inpatient   PATIENT DISPOSITION:  ICU - intubated and hemodynamically stable.   Delay start of Pharmacological VTE agent (>24hrs) due to surgical blood loss or risk of bleeding: yes

## 2021-10-26 NOTE — Progress Notes (Signed)
NAME:  Luis Lindsey, MRN:  [id number], DOB:  July 25, 1950, LOS: 1 ADMISSION DATE:  10/25/2021, CONSULTATION DATE: 10/25/2021 REFERRING MD: Dr. Kipp Brood, CHIEF COMPLAINT: Coronary artery disease s/p CABG  History of Present Illness:  71 year old male with a prior history of stroke diabetes, hyperlipidemia hypertension and coronary artery disease with moderate aortic stenosis who underwent CABG x3 and aortic valve replacement, remained intubated and was transferred to ICU.  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   Dysphagia    Esophageal dysmotility    GERD (gastroesophageal reflux disease)    Heart murmur    pt had recent echocardiogram   History of hiatal hernia    History of Holter monitoring 2009   History of kidney stones    HNP (herniated nucleus pulposus), lumbar    L4-5   HYPERCHOLESTEROLEMIA 11/30/2009   Hyperlipidemia    HYPERTENSION 11/30/2009   Kidney stones    Numbness and tingling    Peripheral vascular disease ([place])    Right carotid bruit    SMOKER 11/30/2009   Stroke ([place]) 2012   URINARY CALCULUS 11/30/2009     Significant Hospital Events: Including procedures, antibiotic start and stop dates in addition to other pertinent events     Interim History / Subjective:  Patient was successfully extubated overnight but rapid weaning protocol Still requiring phenylephrine Denies chest pain or shortness of breath  Objective   Blood pressure (!) 117/44, pulse 71, temperature 99 F (37.2 C), temperature source Oral, resp. rate (!) 24, height '5\' 10"'$  (1.778 m), weight 80.6 kg, SpO2 99 %. CVP:  [3 mmHg-16 mmHg] 16 mmHg CO:  [4.6 L/min-6.5 L/min] 6.5 L/min CI:  [2.4 L/min/m2-3.4 L/min/m2] 3.4 L/min/m2   Vent Mode: PSV FiO2 (%):  [40 %-50 %] 40 % Set Rate:  [4 bmp-12 bmp] 4 bmp Vt Set:  [580 mL] 580 mL PEEP:  [5 cmH20] 5 cmH20 Pressure Support:  [10 cmH20] 10 cmH20   Intake/Output Summary (Last 24 hours) at 10/26/2021 0855 Last data filed at 10/26/2021 0800 Gross per 24 hour  Intake 4356.13 ml  Output 3743 ml  Net 613.13 ml   Filed Weights   10/25/21 0600 10/26/21 0500  Weight: 76.2 kg 80.6 kg    Examination: Physical exam: General: Acute on chronically ill-appearing male, lying on the bed HEENT: [place]/AT, eyes anicteric.  moist mucus membranes Neuro: Alert, awake following commands Chest: Chest tube in place, reduced air entry at the bases bilaterally, no wheezes or rhonchi Heart: Regular rate and rhythm, no murmurs or gallops Abdomen: Soft, nontender, nondistended, bowel sounds present Skin: No rash   Resolved Hospital Problem list     Assessment & Plan:  Coronary artery disease s/p 3-vessel CABG Aortic stenosis status post aortic valve replacement Perioperative blood loss anemia Acute respiratory insufficiency, postop Hypertension Hyperlipidemia Poorly controlled diabetes  Continue aspirin and statin Chest tube management TCTS He is off sedation Monitor H&H Hemoglobin is down to 7.5, consider transfusing 1 unit PRBC Continue pain control with tramadol, oxycodone and fentanyl Holding antihypertensive for now, as patient is requiring phenylephrine to maintain map goal 65 Telemetry monitoring shows V pacing Continue Protonix Monitor urine output  and labs Patient's hemoglobin A1c is 9.8 Insulin infusion was stopped Continue sliding scale insulin with CBG goal 140-180   Best Practice (right click and "Reselect all SmartList Selections" daily)   Diet/type: Consistent carbohydrate diet DVT prophylaxis: Subcu Lovenox GI prophylaxis: PPI Lines: Central line and yes and it is still needed Foley:  Yes, and it is still needed Code Status:  full code Last date of  multidisciplinary goals of care discussion [Per primary team]  Labs   CBC: Recent Labs  Lab 10/24/21 1159 10/25/21 0805 10/25/21 1230 10/25/21 1234 10/25/21 1440 10/25/21 1446 10/25/21 1957 10/25/21 2103 10/26/21 0342  WBC 6.1  --   --   --  8.7  --   --  8.5 5.3  HGB 10.2*   < > 6.1*   < > 8.6* 9.2* 7.1* 7.8*  7.5* 7.5*  HCT 33.0*   < > 19.4*   < > 25.8* 27.0* 21.0* 23.6*  22.0* 23.1*  MCV 93.2  --   --   --  90.2  --   --  90.1 90.2  PLT 316  --  233  --  194  --   --  210 219   < > = values in this interval not displayed.    Basic Metabolic Panel: Recent Labs  Lab 10/24/21 1159 10/25/21 0805 10/25/21 1119 10/25/21 1220 10/25/21 1234 10/25/21 1331 10/25/21 1334 10/25/21 1446 10/25/21 1957 10/25/21 2103 10/26/21 0342  NA 134*   < > 137 138   < > 139 141 143 141 136  140 138  K 4.1   < > 4.0 3.8   < > 4.0 4.1 3.7 4.2 4.4  4.2 3.9  CL 106   < > 103 103  --  105  --   --   --  112* 113*  CO2 19*  --   --   --   --   --   --   --   --  20* 20*  GLUCOSE 237*   < > 75 74  --  87  --   --   --  146* 125*  BUN 20   < > 16 14  --  14  --   --   --  13 15  CREATININE 0.90   < > 0.50* 0.50*  --  0.60*  --   --   --  0.80 0.89  CALCIUM 9.0  --   --   --   --   --   --   --   --  7.9* 7.8*  MG  --   --   --   --   --   --   --   --   --   --  2.3   < > = values in this interval not displayed.   GFR: Estimated Creatinine Clearance: 79.7 mL/min (by C-G formula based on SCr of 0.89 mg/dL). Recent Labs  Lab 10/24/21 1159 10/25/21 1440 10/25/21 2103 10/26/21 0342  WBC 6.1 8.7 8.5 5.3    Liver Function Tests: Recent Labs  Lab 10/24/21 1159  AST 15  ALT 16  ALKPHOS 77  BILITOT 0.5  PROT 6.7  ALBUMIN 3.3*   No results for input(s): LIPASE, AMYLASE in the last 168 hours. No results for input(s): AMMONIA in the last 168 hours.  ABG    Component Value Date/Time   PHART 7.357 10/25/2021 2103   PCO2ART 34.8 10/25/2021 2103   PO2ART 140 (  H) 10/25/2021 2103    HCO3 19.6 (L) 10/25/2021 2103   TCO2 21 (L) 10/25/2021 2103   ACIDBASEDEF 5.0 (H) 10/25/2021 2103   O2SAT 99 10/25/2021 2103     Coagulation Profile: Recent Labs  Lab 10/24/21 1159 10/25/21 1440  INR 1.1 1.4*    Cardiac Enzymes: No results for input(s): CKTOTAL, CKMB, CKMBINDEX, TROPONINI in the last 168 hours.  HbA1C: Hgb A1c MFr Bld  Date/Time Value Ref Range Status  10/24/2021 11:59 AM 9.8 (H) 4.8 - 5.6 % Final    Comment:    (NOTE) Pre diabetes:          5.7%-6.4%  Diabetes:              >6.4%  Glycemic control for   <7.0% adults with diabetes   06/05/2021 12:23 PM 9.7 (H) 4.8 - 5.6 % Final    Comment:    (NOTE) Pre diabetes:          5.7%-6.4%  Diabetes:              >6.4%  Glycemic control for   <7.0% adults with diabetes     CBG: Recent Labs  Lab 10/25/21 1806 10/25/21 1955 10/25/21 2101 10/25/21 2346 10/26/21 0341  GLUCAP 86 112* 146* 130* 120*    Critical care time:     Total critical care time: 32 minutes  Performed by: Kittrell care time was exclusive of separately billable procedures and treating other patients.   Critical care was necessary to treat or prevent imminent or life-threatening deterioration.   Critical care was time spent personally by me on the following activities: development of treatment plan with patient and/or surrogate as well as nursing, discussions with consultants, evaluation of patient's response to treatment, examination of patient, obtaining history from patient or surrogate, ordering and performing treatments and interventions, ordering and review of laboratory studies, ordering and review of radiographic studies, pulse oximetry and re-evaluation of patient's condition.   Jacky Kindle MD [facility] See Amion for pager If no response to pager, please call [phone] until 7pm After 7pm, Please call E-link [phone]

## 2021-10-26 NOTE — Progress Notes (Signed)
      [address]       [place] [address]             [phone]      POD # 1 CABG x 3, AVR  Sleeping currently  BP (!) 117/42   Pulse (!) 167   Temp 99 F (37.2 C) (Oral)   Resp (!) 21   Ht '5\' 10"'$  (1.778 m)   Wt 80.6 kg   SpO2 93%   BMI 25.50 kg/m   Intake/Output Summary (Last 24 hours) at 10/26/2021 1933 Last data filed at 10/26/2021 1730 Gross per 24 hour  Intake 2207.76 ml  Output 1160 ml  Net 1047.76 ml   K= 4.2, creatinine 1.16 Hct 26  CBG elevated this PM- SSI  Gurnoor Sloop C. Roxan Hockey, MD [facility] [phone]

## 2021-10-26 NOTE — Progress Notes (Signed)
[address]       [place] [address]             [phone]                 1 Day Post-Op Procedure(s) (LRB): CORONARY ARTERY BYPASS GRAFTING (CABG) X 3 USING LEFT INTERNAL MAMMARY ARTERY AND RIGHT GREATER SAPHENOUS VEIN (N/A) AORTIC VALVE REPLACEMENT (AVR)USING 23MM INSPIRIS RESILIA  AORTIC VALVE (N/A) TRANSESOPHAGEAL ECHOCARDIOGRAM (TEE) (N/A) ENDOVEIN HARVEST OF GREATER SAPHENOUS VEIN (Right)   Events: No events _______________________________________________________________ Vitals: BP (!) 175/53   Pulse 71   Temp 98.7 F (37.1 C) (Oral)   Resp (!) 24   Ht '5\' 10"'$  (1.778 m)   Wt 80.6 kg   SpO2 96%   BMI 25.50 kg/m  Filed Weights   10/25/21 0600 10/26/21 0500  Weight: 76.2 kg 80.6 kg     - Neuro: alert NAd  - Cardiovascular: sinus  Drips: neo 20.   CVP:  [3 mmHg-16 mmHg] 15 mmHg CO:  [4.6 L/min-6.5 L/min] 6.5 L/min CI:  [2.4 L/min/m2-3.4 L/min/m2] 3.4 L/min/m2  - Pulm: EWOB   ABG    Component Value Date/Time   PHART 7.357 10/25/2021 2103   PCO2ART 34.8 10/25/2021 2103   PO2ART 140 (H) 10/25/2021 2103   HCO3 19.6 (L) 10/25/2021 2103   TCO2 21 (L) 10/25/2021 2103   ACIDBASEDEF 5.0 (H) 10/25/2021 2103   O2SAT 99 10/25/2021 2103    - Abd: ND - Extremity: warm  .Intake/Output      05/31 0701 06/01 0700 06/01 0701 06/02 0700   P.O.  240   I.V. (mL/kg) 3306.8 (41) 388.2 (4.8)   Blood 334 373   IV Piggyback 898.1 100.1   Total Intake(mL/kg) 4538.9 (56.3) 1101.3 (13.7)   Urine (mL/kg/hr) 2355 (1.2) 155 (0.3)   Blood 693    Chest Tube 590 30   Total Output 3638 185   Net +900.9 +916.3           _______________________________________________________________ Labs:    Latest Ref Rng & Units 10/26/2021    3:42 AM 10/25/2021    9:03 PM 10/25/2021    7:57 PM  CBC  WBC 4.0 - 10.5 K/uL 5.3   8.5     Hemoglobin 13.0 - 17.0 g/dL 7.5   7.8     7.5   7.1    Hematocrit 39.0 - 52.0 % 23.1   23.6     22.0   21.0    Platelets  150 - 400 K/uL 219   210         Latest Ref Rng & Units 10/26/2021    3:42 AM 10/25/2021    9:03 PM 10/25/2021    7:57 PM  CMP  Glucose 70 - 99 mg/dL 125   146     BUN 8 - 23 mg/dL 15   13     Creatinine 0.61 - 1.24 mg/dL 0.89   0.80     Sodium 135 - 145 mmol/L 138   136     140   141    Potassium 3.5 - 5.1 mmol/L 3.9   4.4     4.2   4.2    Chloride 98 - 111 mmol/L 113   112     CO2 22 - 32 mmol/L 20   20     Calcium 8.9 - 10.3 mg/dL 7.8   7.9       CXR: clear  _______________________________________________________________  Assessment and Plan: POD 1 s/p AVR, CABG  Neuro: pain controlled.  CV: wean neo.  Will transfuse 1 unit.  Pacing set to back up.  Once out, will start plavix for endarterectomy Pulm: will keep wire, IS, ambulation Renal: creat stable GI: on diet Heme: stable ID: afebrile Endo: SSI Dispo: continue ICU care   Luis Lindsey 10/26/2021 1:12 PM

## 2021-10-27 ENCOUNTER — Other Ambulatory Visit (HOSPITAL_COMMUNITY): Payer: Self-pay

## 2021-10-27 ENCOUNTER — Inpatient Hospital Stay (HOSPITAL_COMMUNITY): Payer: HMO

## 2021-10-27 DIAGNOSIS — Z952 Presence of prosthetic heart valve: Secondary | ICD-10-CM | POA: Diagnosis not present

## 2021-10-27 LAB — CBC
HCT: 24.9 % — ABNORMAL LOW (ref 39.0–52.0)
Hemoglobin: 8 g/dL — ABNORMAL LOW (ref 13.0–17.0)
MCH: 28.6 pg (ref 26.0–34.0)
MCHC: 32.1 g/dL (ref 30.0–36.0)
MCV: 88.9 fL (ref 80.0–100.0)
Platelets: 158 10*3/uL (ref 150–400)
RBC: 2.8 MIL/uL — ABNORMAL LOW (ref 4.22–5.81)
RDW: 17.3 % — ABNORMAL HIGH (ref 11.5–15.5)
WBC: 7.8 10*3/uL (ref 4.0–10.5)
nRBC: 0 % (ref 0.0–0.2)

## 2021-10-27 LAB — ECHO INTRAOPERATIVE TEE
AR max vel: 1.15 cm2
AV Area VTI: 1.12 cm2
AV Area mean vel: 1.18 cm2
AV Mean grad: 21 mmHg
AV Peak grad: 36 mmHg
Ao pk vel: 3 m/s
Area-P 1/2: 3.17 cm2
Height: 70 in
MV VTI: 3.73 cm2
S' Lateral: 2.6 cm
Weight: 2688 oz

## 2021-10-27 LAB — BASIC METABOLIC PANEL
Anion gap: 3 — ABNORMAL LOW (ref 5–15)
BUN: 25 mg/dL — ABNORMAL HIGH (ref 8–23)
CO2: 22 mmol/L (ref 22–32)
Calcium: 7.8 mg/dL — ABNORMAL LOW (ref 8.9–10.3)
Chloride: 109 mmol/L (ref 98–111)
Creatinine, Ser: 1.13 mg/dL (ref 0.61–1.24)
GFR, Estimated: >60 mL/min (ref >60)
Glucose, Bld: 154 mg/dL — ABNORMAL HIGH (ref 70–99)
Potassium: 4 mmol/L (ref 3.5–5.1)
Sodium: 134 mmol/L — ABNORMAL LOW (ref 135–145)

## 2021-10-27 LAB — GLUCOSE, CAPILLARY
Glucose-Capillary: 169 mg/dL — ABNORMAL HIGH (ref 70–99)
Glucose-Capillary: 163 mg/dL — ABNORMAL HIGH (ref 70–99)
Glucose-Capillary: 129 mg/dL — ABNORMAL HIGH (ref 70–99)

## 2021-10-27 LAB — SURGICAL PATHOLOGY

## 2021-10-27 MED ORDER — FE FUMARATE-B12-VIT C-FA-IFC PO CAPS
1.0000 | ORAL_CAPSULE | Freq: Two times a day (BID) | ORAL | Status: DC
  Administered 2021-10-27 – 2021-10-31 (×9): 1 via ORAL
  Filled 2021-10-27 (×9): qty 1

## 2021-10-27 MED ORDER — INSULIN ASPART 100 UNIT/ML IJ SOLN
0.0000 [IU] | Freq: Three times a day (TID) | INTRAMUSCULAR | Status: DC
  Administered 2021-10-27: 4 [IU] via SUBCUTANEOUS
  Administered 2021-10-27 – 2021-10-29 (×4): 2 [IU] via SUBCUTANEOUS
  Administered 2021-10-30 (×2): 4 [IU] via SUBCUTANEOUS
  Administered 2021-10-30: 8 [IU] via SUBCUTANEOUS
  Administered 2021-10-31: 2 [IU] via SUBCUTANEOUS

## 2021-10-27 MED ORDER — INSULIN DETEMIR 100 UNIT/ML ~~LOC~~ SOLN
20.0000 [IU] | Freq: Every day | SUBCUTANEOUS | Status: DC
  Administered 2021-10-27 – 2021-10-31 (×5): 20 [IU] via SUBCUTANEOUS
  Filled 2021-10-27 (×5): qty 0.2

## 2021-10-27 MED ORDER — SODIUM CHLORIDE 0.9 % IV SOLN: 250.0000 mL | INTRAVENOUS | Status: DC | PRN

## 2021-10-27 MED ORDER — CLOPIDOGREL BISULFATE 75 MG PO TABS
75.0000 mg | ORAL_TABLET | Freq: Every day | ORAL | Status: DC
  Administered 2021-10-27 – 2021-10-31 (×5): 75 mg via ORAL
  Filled 2021-10-27 (×5): qty 1

## 2021-10-27 MED ORDER — INSULIN ASPART 100 UNIT/ML IJ SOLN: 0.0000 [IU] | INTRAMUSCULAR | Status: DC

## 2021-10-27 MED ORDER — SODIUM CHLORIDE 0.9% FLUSH
3.0000 mL | Freq: Two times a day (BID) | INTRAVENOUS | Status: DC
  Administered 2021-10-27 – 2021-10-31 (×8): 3 mL via INTRAVENOUS

## 2021-10-27 MED ORDER — ~~LOC~~ CARDIAC SURGERY, PATIENT & FAMILY EDUCATION: Freq: Once | Status: AC

## 2021-10-27 MED ORDER — POLYETHYLENE GLYCOL 3350 17 G PO PACK
17.0000 g | PACK | Freq: Every day | ORAL | Status: DC
  Administered 2021-10-27 – 2021-10-29 (×3): 17 g via ORAL
  Filled 2021-10-27 (×3): qty 1

## 2021-10-27 MED ORDER — SODIUM CHLORIDE 0.9% FLUSH: 3.0000 mL | INTRAVENOUS | Status: DC | PRN

## 2021-10-27 MED ORDER — POTASSIUM CHLORIDE CRYS ER 20 MEQ PO TBCR
20.0000 meq | EXTENDED_RELEASE_TABLET | Freq: Every day | ORAL | Status: DC
  Administered 2021-10-27 – 2021-10-31 (×5): 20 meq via ORAL
  Filled 2021-10-27 (×5): qty 1

## 2021-10-27 MED ORDER — FUROSEMIDE 40 MG PO TABS
40.0000 mg | ORAL_TABLET | Freq: Every day | ORAL | Status: DC
  Administered 2021-10-27 – 2021-10-31 (×5): 40 mg via ORAL
  Filled 2021-10-27 (×5): qty 1

## 2021-10-27 MED FILL — Sodium Chloride IV Soln 0.9%: INTRAVENOUS | Qty: 2000 | Status: AC

## 2021-10-27 MED FILL — Calcium Chloride Inj 10%: INTRAVENOUS | Qty: 10 | Status: AC

## 2021-10-27 MED FILL — Mannitol IV Soln 20%: INTRAVENOUS | Qty: 500 | Status: AC

## 2021-10-27 MED FILL — Heparin Sodium (Porcine) Inj 1000 Unit/ML: INTRAMUSCULAR | Qty: 10 | Status: AC

## 2021-10-27 MED FILL — Sodium Bicarbonate IV Soln 8.4%: INTRAVENOUS | Qty: 50 | Status: AC

## 2021-10-27 MED FILL — Albumin, Human Inj 5%: INTRAVENOUS | Qty: 250 | Status: AC

## 2021-10-27 MED FILL — Lidocaine HCl Local Preservative Free (PF) Inj 2%: INTRAMUSCULAR | Qty: 15 | Status: AC

## 2021-10-27 NOTE — Progress Notes (Addendum)
[address]       [place] [address]             [phone]      2 Days Post-Op Procedure(s) (LRB): CORONARY ARTERY BYPASS GRAFTING (CABG) X 3 USING LEFT INTERNAL MAMMARY ARTERY AND RIGHT GREATER SAPHENOUS VEIN (N/A) AORTIC VALVE REPLACEMENT (AVR)USING 23MM INSPIRIS RESILIA  AORTIC VALVE (N/A) TRANSESOPHAGEAL ECHOCARDIOGRAM (TEE) (N/A) ENDOVEIN HARVEST OF GREATER SAPHENOUS VEIN (Right) Subjective: Feeling better, no further nausea  Objective: Vital signs in last 24 hours: Temp:  [98 F (36.7 C)-99 F (37.2 C)] 98 F (36.7 C) (06/02 0400) Pulse Rate:  [59-167] 60 (06/02 0646) Cardiac Rhythm: Normal sinus rhythm (06/02 0600) Resp:  [12-30] 27 (06/02 0646) BP: (106-175)/(23-104) 110/43 (06/02 0600) SpO2:  [89 %-99 %] 91 % (06/02 0646) Arterial Line BP: (108-155)/(36-50) 155/50 (06/01 1115) Weight:  [81.7 kg] 81.7 kg (06/02 0500)  Hemodynamic parameters for last 24 hours: CVP:  [6 mmHg-16 mmHg] 15 mmHg  Intake/Output from previous day: 06/01 0701 - 06/02 0700 In: 1384.4 [P.O.:420; I.V.:391.3; Blood:373; IV Piggyback:200.1] Out: 645 [Urine:255; Chest Tube:390] Intake/Output this shift: No intake/output data recorded.  General appearance: alert, cooperative, and no distress Heart: regular rate and rhythm Lungs: mildly dim in bases Abdomen: benign Extremities: no significant edema Wound: dressings dry/intact  Lab Results: Recent Labs    10/26/21 1728 10/27/21 0216  WBC 7.6 7.8  HGB 8.7* 8.0*  HCT 26.5* 24.9*  PLT 164 158   BMET:  Recent Labs    10/26/21 1728 10/27/21 0216  NA 135 134*  K 4.2 4.0  CL 108 109  CO2 21* 22  GLUCOSE 262* 154*  BUN 23 25*  CREATININE 1.16 1.13  CALCIUM 7.9* 7.8*    PT/INR:  Recent Labs    10/25/21 1440  LABPROT 17.1*  INR 1.4*   ABG    Component Value Date/Time   PHART 7.357 10/25/2021 2103   HCO3 19.6 (L) 10/25/2021 2103   TCO2 21 (L) 10/25/2021 2103   ACIDBASEDEF 5.0 (H) 10/25/2021 2103    O2SAT 99 10/25/2021 2103   CBG (last 3)  Recent Labs    10/26/21 2004 10/26/21 2343 10/27/21 0645  GLUCAP 232* 181* 169*    Meds Scheduled Meds:  acetaminophen  1,000 mg Oral Q6H   Or   acetaminophen (TYLENOL) oral liquid 160 mg/5 mL  1,000 mg Per Tube Q6H   aspirin EC  325 mg Oral Daily   Or   aspirin  324 mg Per Tube Daily   bisacodyl  10 mg Oral Daily   Or   bisacodyl  10 mg Rectal Daily   Chlorhexidine Gluconate Cloth  6 each Topical Daily   docusate sodium  200 mg Oral Daily   enoxaparin (LOVENOX) injection  40 mg Subcutaneous QHS   ezetimibe  10 mg Oral Daily   ferrous sulfate  325 mg Oral Q breakfast   insulin aspart  0-24 Units Subcutaneous TID AC & HS   mouth rinse  15 mL Mouth Rinse BID   metoprolol tartrate  12.5 mg Oral BID   Or   metoprolol tartrate  12.5 mg Per Tube BID   pantoprazole  40 mg Oral Q breakfast   polyethylene glycol  17 g Oral Daily   rosuvastatin  40 mg Oral Daily   sodium chloride flush  10-40 mL Intracatheter Q12H   sodium chloride flush  3 mL Intravenous Q12H   Continuous Infusions:  sodium chloride  sodium chloride     sodium chloride     lactated ringers     lactated ringers     lactated ringers Stopped (10/26/21 1109)   milrinone     nitroGLYCERIN     phenylephrine (NEO-SYNEPHRINE) Adult infusion Stopped (10/26/21 0925)   PRN Meds:.sodium chloride, dextrose, lactated ringers, metoprolol tartrate, midazolam, morphine injection, ondansetron (ZOFRAN) IV, oxyCODONE, sodium chloride flush, sodium chloride flush, traMADol  Xrays DG Chest Port 1 View  Result Date: 10/26/2021 CLINICAL DATA:  Post aortic valve replacement and CABG EXAM: PORTABLE CHEST 1 VIEW COMPARISON:  10/25/2021 FINDINGS: Right IJ central line tip overlies the SVC. Endotracheal tube has been removed. Mediastinal drains remain present. Probable bibasilar atelectasis. Stable cardiomediastinal contours. No pneumothorax. IMPRESSION: Lines and tubes as above.   Probable bibasilar atelectasis. Electronically Signed   By: Macy Mis M.D.   On: 10/26/2021 08:07   DG Chest Port 1 View  Result Date: 10/25/2021 CLINICAL DATA:  Aortic valve replacement EXAM: PORTABLE CHEST 1 VIEW COMPARISON:  10/24/2021 FINDINGS: Interval postsurgical changes to the chest from aortic valve replacement. ET tube terminates approximately 4 cm above the carina. Right IJ central venous catheter terminates at the level of the mid SVC. Mediastinal drains are seen. Heart size within normal limits. Aortic atherosclerosis. Lungs are clear. No pleural effusion or pneumothorax. IMPRESSION: Interval postsurgical changes to the chest from aortic valve replacement. No acute findings. Electronically Signed   By: Davina Poke D.O.   On: 10/25/2021 14:47    Assessment/Plan: S/P Procedure(s) (LRB): CORONARY ARTERY BYPASS GRAFTING (CABG) X 3 USING LEFT INTERNAL MAMMARY ARTERY AND RIGHT GREATER SAPHENOUS VEIN (N/A) AORTIC VALVE REPLACEMENT (AVR)USING 23MM INSPIRIS RESILIA  AORTIC VALVE (N/A) TRANSESOPHAGEAL ECHOCARDIOGRAM (TEE) (N/A) ENDOVEIN HARVEST OF GREATER SAPHENOUS VEIN (Right) POD#2  1 afebriles BP 100's-175 range,most readings in normal range,  sinus rhythm, no gtts, poss d/c wires soon 2 sats good on 3 liters 3 only 255 cc of urine recorded, will add lasix short term 4 weight up about 5 kg 5 CT 390 cc/24 h, poss d/c soon 6 normal creat , BUN 25 7 expected ABLA , not currently in transfusion threshold, change iron to trinsicon 8 CXR basilar atx 9 routine pulm hygiene and rehab modalities 10 CBG - fair control. Will add some levemir to SSI 11 miralax added to bowel regimen by CCM    LOS: 2 days    Luis Giovanni PA-C Pager [phone] 10/27/2021  Agree with above. We will remove wires and chest tubes good. Starting Plavix today for endarterectomy. Floor today.  Luis Lindsey

## 2021-10-27 NOTE — Progress Notes (Signed)
NAME:  Luis Lindsey, MRN:  [id number], DOB:  February 03, 1951, LOS: 2 ADMISSION DATE:  10/25/2021, CONSULTATION DATE:  5/31 REFERRING MD:  Kipp Brood, CHIEF COMPLAINT:  CAD s/p CABG   History of Present Illness:  71 y/o male with multiple medical problems underwent CABG x3 and AVR on 5/31 by Dr. Kipp Brood.  PCCM consulted for medical management.  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 Kipp Brood, extubated  Interim History / Subjective:  Eating has been out of bed, constipated, pain is manageable  Objective   Blood pressure (!) 110/43, pulse 60, temperature 98 F (36.7 C), temperature source Oral, resp. rate (!) 27, height '5\' 10"'$  (1.778 m), weight 81.7 kg, SpO2 91 %. CVP:  [6 mmHg-16 mmHg] 15 mmHg      Intake/Output Summary (Last 24 hours) at 10/27/2021 0747 Last data filed at 10/27/2021 0500 Gross per 24 hour  Intake 1384.39 ml  Output 645 ml  Net 739.39 ml   Filed Weights   10/25/21 0600 10/26/21 0500 10/27/21 0500  Weight: 76.2 kg 80.6 kg 81.7 kg    Examination:  General:  Resting comfortably in bed HENT: NCAT OP clear PULM: CTA B, normal effort CV: RRR, no mgr GI: BS+, soft, nontender MSK: normal bulk and tone Neuro: awake, alert, no distress, MAEW   Resolved Hospital Problem list     Assessment & Plan:  CAD s/p 3 vessel CABG Aortic stenosis s/p aortic valve replacement Metoprolol Asa Chest tube per TCTS  Perioperative blood loss anemia Monitor for bleeding Transfuse PRBC for Hgb < 7 gm/dL  Hypertension Metoprolol  GERD PPI  Hyperlipidemia Zetia, crestor  Post op atelectasis Out of bed, incentive spirometry  DM2, poorly  controlled Monitor glucose SSI  Constipation Dulcolax Colace Add miralax   Best Practice (right click and "Reselect all SmartList Selections" daily)   Diet/type: Regular consistency (see orders) DVT prophylaxis: LMWH GI prophylaxis: N/A Lines: Central line Foley:  Yes, and it is no longer needed Code Status:  full code Last date of multidisciplinary goals of care discussion [per TCTS]  Labs   CBC: Recent Labs  Lab 10/25/21 1440 10/25/21 1446 10/25/21 1957 10/25/21 2103 10/26/21 0342 10/26/21 1728 10/27/21 0216  WBC 8.7  --   --  8.5 5.3 7.6 7.8  HGB 8.6*   < > 7.1* 7.8*  7.5* 7.5* 8.7* 8.0*  HCT 25.8*   < > 21.0* 23.6*  22.0* 23.1* 26.5* 24.9*  MCV 90.2  --   --  90.1 90.2 88.0 88.9  PLT 194  --   --  210 219 164 158   < > = values in this interval not displayed.    Basic Metabolic Panel: Recent Labs  Lab 10/24/21 1159 10/25/21 0805 10/25/21 1331 10/25/21 1334 10/25/21 1957 10/25/21 2103 10/26/21 0342 10/26/21 1728 10/27/21 0216  NA 134*   < > 139   < > 141 136  140 138 135 134*  K 4.1   < > 4.0   < > 4.2 4.4  4.2 3.9 4.2 4.0  CL 106   < > 105  --   --  112* 113* 108 109  CO2 19*  --   --   --   --  20*  20* 21* 22  GLUCOSE 237*   < > 87  --   --  146* 125* 262* 154*  BUN 20   < > 14  --   --  '13 15 23 '$ 25*  CREATININE 0.90   < > 0.60*  --   --  0.80 0.89 1.16 1.13  CALCIUM 9.0  --   --   --   --  7.9* 7.8* 7.9* 7.8*  MG  --   --   --   --   --   --  2.3 2.2  --    < > = values in this interval not displayed.   GFR: Estimated Creatinine Clearance: 62.8 mL/min (by C-G formula based on SCr of 1.13 mg/dL). Recent Labs  Lab 10/25/21 2103 10/26/21 0342 10/26/21 1728 10/27/21 0216  WBC 8.5 5.3 7.6 7.8    Liver Function Tests: Recent Labs  Lab 10/24/21 1159  AST 15  ALT 16  ALKPHOS 77  BILITOT 0.5  PROT 6.7  ALBUMIN 3.3*   No results for input(s): LIPASE, AMYLASE in the last 168 hours. No results for input(s): AMMONIA in the last 168  hours.  ABG    Component Value Date/Time   PHART 7.357 10/25/2021 2103   PCO2ART 34.8 10/25/2021 2103   PO2ART 140 (H) 10/25/2021 2103   HCO3 19.6 (L) 10/25/2021 2103   TCO2 21 (L) 10/25/2021 2103   ACIDBASEDEF 5.0 (H) 10/25/2021 2103   O2SAT 99 10/25/2021 2103     Coagulation Profile: Recent Labs  Lab 10/24/21 1159 10/25/21 1440  INR 1.1 1.4*    Cardiac Enzymes: No results for input(s): CKTOTAL, CKMB, CKMBINDEX, TROPONINI in the last 168 hours.  HbA1C: Hgb A1c MFr Bld  Date/Time Value Ref Range Status  10/24/2021 11:59 AM 9.8 (H) 4.8 - 5.6 % Final    Comment:    (NOTE) Pre diabetes:          5.7%-6.4%  Diabetes:              >6.4%  Glycemic control for   <7.0% adults with diabetes   06/05/2021 12:23 PM 9.7 (H) 4.8 - 5.6 % Final    Comment:    (NOTE) Pre diabetes:          5.7%-6.4%  Diabetes:              >6.4%  Glycemic control for   <7.0% adults with diabetes     CBG: Recent Labs  Lab 10/26/21 1204 10/26/21 1704 10/26/21 2004 10/26/21 2343 10/27/21 0645  GLUCAP 103* 241* 232* 181* 169*      Critical care time: n/a    Roselie Awkward, MD [facility] Pager: [phone] Cell: [phone] After 7:00 pm call Elink  [phone]

## 2021-10-27 NOTE — Progress Notes (Signed)
Patient ID: Luis Lindsey, male   DOB: 18-Oct-1950, 71 y.o.   MRN: [id number]  TCTS Evening Rounds:  Hemodynamically stable   Sats 97% on [place].  Good urine output.  Waiting on 4E bed.

## 2021-10-27 NOTE — Anesthesia Postprocedure Evaluation (Signed)
Anesthesia Post Note  Patient: Luis Lindsey  Procedure(s) Performed: CORONARY ARTERY BYPASS GRAFTING (CABG) X 3 USING LEFT INTERNAL MAMMARY ARTERY AND RIGHT GREATER SAPHENOUS VEIN (Chest) AORTIC VALVE REPLACEMENT (AVR)USING 23MM INSPIRIS RESILIA  AORTIC VALVE (Chest) TRANSESOPHAGEAL ECHOCARDIOGRAM (TEE) ENDOVEIN HARVEST OF GREATER SAPHENOUS VEIN (Right)     Patient location during evaluation: SICU Anesthesia Type: General Level of consciousness: sedated Pain management: pain level controlled Vital Signs Assessment: post-procedure vital signs reviewed and stable Respiratory status: patient remains intubated per anesthesia plan Cardiovascular status: stable Postop Assessment: no apparent nausea or vomiting Anesthetic complications: no   No notable events documented.  Last Vitals:  Vitals:   10/27/21 2000 10/27/21 2100  BP: (!) 119/52 (!) 141/52  Pulse: 60 61  Resp: 18 20  Temp:    SpO2: 96% 97%    Last Pain:  Vitals:   10/27/21 1728  TempSrc:   PainSc: 2                  March Rummage Kailene Steinhart

## 2021-10-27 NOTE — TOC Benefit Eligibility Note (Signed)
Patient Teacher, English as a foreign language completed.    The patient is currently admitted and upon discharge could be taking Farxiga 10 mg.  The current 30 day co-pay is, $0.00.   The patient is currently admitted and upon discharge could be taking Jardiance 10 mg.  The current 30 day co-pay is, $0.00.   The patient is insured through [employer], [place] Patient Advocate Specialist [place] Patient Advocate Team Direct Number: [phone]  Fax: [phone]

## 2021-10-27 NOTE — Progress Notes (Signed)
Inpatient Diabetes Program Recommendations  AACE/ADA: New Consensus Statement on Inpatient Glycemic Control (2015)  Target Ranges:  Prepandial:   less than 140 mg/dL      Peak postprandial:   less than 180 mg/dL (1-2 hours)      Critically ill patients:  140 - 180 mg/dL   Lab Results  Component Value Date   GLUCAP 163 (H) 10/27/2021   HGBA1C 9.8 (H) 10/24/2021    Review of Glycemic Control  Latest Reference Range & Units 10/26/21 23:43 10/27/21 06:45 10/27/21 11:48  Glucose-Capillary 70 - 99 mg/dL 181 (H) 169 (H) 163 (H)  (H): Data is abnormally high Diabetes history: Type 2 DM Outpatient Diabetes medications: Tresiba 66 units QD, Metformin 1000 mg BID, Actos 45 mg QD Current orders for Inpatient glycemic control: Levemir 20 units QD, Novolog 0-24 units TID & HS  Inpatient Diabetes Program Recommendations:    Spoke with patient regarding outpatient diabetes management. Patient verifies home medications.  Reviewed patient's current A1c of 9.8%. Explained what a A1c is and what it measures. Also reviewed goal A1c with patient, importance of good glucose control @ home, and blood sugar goals. Reviewed patho of DM, need for improved control, role of pancreas, action of SGLT2, benefits and action of GLPs, infection risk, vascular changes and commorbidities.  Patient is currently wearing a freestyle libre and has additional supplies at home. Encouraged to follow up with PCP regarding elevations exceeding goals.  Used to see Dr Loanne Drilling, outpatient endocrinology and plan to see new provider. Reviewed questions at ask at next visit and encouraged appointment with PCP in the next two weeks following hospitalization.  Denies eating or drinking excessive CHO. Reviewed plate method, importance of incorporating protein with meals/snacks, and importance of continued mindfulness.  Patient had no additional questions at this time.   Thanks, Bronson Curb, MSN, RNC-OB Diabetes  Coordinator [phone] (8a-5p)

## 2021-10-27 NOTE — Discharge Summary (Signed)
[address]       [place] [address]             [phone]    Physician Discharge Summary  Patient ID: Luis Lindsey MRN: [id number] DOB/AGE: 1950/09/07 71 y.o.  Admit date: 10/25/2021 Discharge date: 10/31/2021  Admission Diagnoses:  Patient Active Problem List   Diagnosis Date Noted   S/P AVR (aortic valve replacement) 10/25/2021   Coronary artery disease of native artery of native heart with stable angina pectoris (HCC)    Abnormal cardiac CT angiography    PAD (peripheral artery disease) ([place]) 06/05/2021   S/P lumbar fusion 12/07/2020   Peripheral vascular disease ([place]) 04/06/2020   Cerebrovascular accident (CVA) ([place]) 04/06/2020   S/P carotid endarterectomy 04/06/2020   DM type 2 with diabetic peripheral neuropathy ([place]) 12/31/2019   Gait abnormality 12/31/2019   Tremor 12/07/2019   Carotid stenosis, asymptomatic, right 08/24/2019   Carotid disease, bilateral ([place]) 08/11/2019   S/P lumbar laminectomy 08/01/2018   PVD (peripheral vascular disease) ([place]) 06/10/2018   Left lumbar radiculopathy 04/01/2018   Peripheral neuropathy 04/01/2018   Left leg claudication ([place]) 04/01/2018   Status post arthroscopy of right shoulder 06/27/2017   Superior glenoid labrum lesion of right shoulder 06/10/2017   Chronic right shoulder pain 04/16/2017   Displaced fracture of proximal phalanx of right great toe with routine healing 03/13/2017   Impingement syndrome of right shoulder 03/13/2017   Displaced fracture of proximal phalanx of right great toe, initial encounter for closed fracture 02/20/2017   Diabetes ([place]) 08/24/2013   History of Holter monitoring    Heart murmur    TIA (transient ischemic attack) 12/26/2012   Kidney stones    DM 11/30/2009   HYPERCHOLESTEROLEMIA 11/30/2009   SMOKER 11/30/2009   HYPERTENSION 11/30/2009   URINARY CALCULUS 11/30/2009   CAROTID BRUIT, RIGHT 11/30/2009     Discharge Diagnoses:  Patient Active Problem List    Diagnosis Date Noted   S/P AVR (aortic valve replacement) 10/25/2021   Coronary artery disease of native artery of native heart with stable angina pectoris (HCC)    Abnormal cardiac CT angiography    PAD (peripheral artery disease) ([place]) 06/05/2021   S/P lumbar fusion 12/07/2020   Peripheral vascular disease ([place]) 04/06/2020   Cerebrovascular accident (CVA) ([place]) 04/06/2020   S/P carotid endarterectomy 04/06/2020   DM type 2 with diabetic peripheral neuropathy ([place]) 12/31/2019   Gait abnormality 12/31/2019   Tremor 12/07/2019   Carotid stenosis, asymptomatic, right 08/24/2019   Carotid disease, bilateral ([place]) 08/11/2019   S/P lumbar laminectomy 08/01/2018   PVD (peripheral vascular disease) ([place]) 06/10/2018   Left lumbar radiculopathy 04/01/2018   Peripheral neuropathy 04/01/2018   Left leg claudication ([place]) 04/01/2018   Status post arthroscopy of right shoulder 06/27/2017   Superior glenoid labrum lesion of right shoulder 06/10/2017   Chronic right shoulder pain 04/16/2017   Displaced fracture of proximal phalanx of right great toe with routine healing 03/13/2017   Impingement syndrome of right shoulder 03/13/2017   Displaced fracture of proximal phalanx of right great toe, initial encounter for closed fracture 02/20/2017   Diabetes ([place]) 08/24/2013   History of Holter monitoring    Heart murmur    TIA (transient ischemic attack) 12/26/2012   Kidney stones    DM 11/30/2009   HYPERCHOLESTEROLEMIA 11/30/2009   SMOKER 11/30/2009   HYPERTENSION 11/30/2009   URINARY CALCULUS 11/30/2009   CAROTID BRUIT, RIGHT 11/30/2009     Discharged Condition: good  History of Present Illness:  Luis Lindsey is a 71 yo male with history of PAD S/P L SFA atherectomy, Carotid Artery Stenosis S/P L CEA, HTN, HLD, tobacco abuse, and aortic stenosis.  The patient developed lightheadedness back in 2021.  This occurred in the setting of anemia and hypotension.  He has been followed by Cardiology  since that time.  [name] recently developed complaints of chest pain.  CTA of the chest obtained in May of this year showed an elevated calcium score concerning for CAD.  He underwent Echocardiogram in April which showed stable mild to moderate Aortic valve stenosis.  He underwent cardiac catheterization on 10/05/2021 which showed multivessel CAD.  It was felt the patient would benefit from coronary bypass grafting and replacement of his Aortic Valve and he was referred to [facility].  He was evaluated by Dr. Kipp Brood at which time the patient admitted to having strokes and TIAs in the past.  He occasionally has chest pain and shortness of breath that sometimes occur at rest.  It was felt the patient would require surgical intervention consisting of Coronary artery bypass grafting and Aortic Valve Replacement.  The patient required dental clearance prior to the procedure.  The risks and benefits of the procedure were explained to the patient and he was agreeable to proceed.  Hospital Course:  Mr. Noone presented to [facility] on 10/25/2021.  The patient was taken to the operating room and underwent CABG x 3 utilizing LIMA to LAD,  SVG to PDA, and SVG to OM1.  He also underwent Aortic Valve Replacement with a 23 mm Edwards Resilia Valve.  Finally he underwent endoscopic harvest of greater saphenous vein from the right leg.  Postoperative hospital course:  The patient was weaned from the ventilator using standard post cardiac surgical protocols without difficulty.  He does have an expected acute blood loss anemia and has required transfusion.  He has required Neo-Synephrine as well as backup pacing in the early postoperative period renal function has remained normal.  These were subsequently discontinued over time.  Critical care medicine team has assisted with postoperative management during his ICU portion of his hospitalization.  He does have some expected postoperative  volume overload and is being treated with a course of diuretics.  Oxygen is being weaned over time with usual pulmonary hygiene measures as well as routine cardiac rehab modalities.  He was initially in a junctional rhythm alternating with complete heart block.  Beta blocking agents were withheld.  He had recovery of his conduction system with return of sinus rhythm by the third postoperative day.  Glucose was monitored and sliding scale insulin was provided as required.  He will be transition to his home medications for diabetes at discharge.  He was mobilized and made good progress with ambulation.  Incisions are healing well without evidence of infection.  He is maintaining sinus rhythm with a first-degree AV block at time of discharge.  He has some elevated blood pressure readings and has been resumed on his Lotrel.  Additionally HCTZ was added.  He may require further adjustments as his blood pressure was not well controlled at home.  Overall at the time of discharge she is felt to be quite stable.  Consults: None  Significant Diagnostic Studies:  DG Chest 2 View  Result Date: 10/25/2021 CLINICAL DATA:  Preop examination CABG Aortic stenosis Hypertension Former smoker EXAM: CHEST - 2 VIEW COMPARISON:  12/05/2020 FINDINGS: Cardiomediastinal silhouette and pulmonary vasculature are  within normal limits. Lungs are clear. IMPRESSION: No acute cardiopulmonary process. Electronically Signed   By: Miachel Roux M.D.   On: 10/25/2021 09:42   CARDIAC CATHETERIZATION  Result Date: 10/05/2021 Conclusions: Severe multivessel coronary disease, including diffuse LMCA disease of up to 70% with pressure dampening of 46F diagnostic catheter, 90% distal LCx stenosis, and sequential 70-80% ostial, 40% proximal, 80% mid, and 50% distal RCA lesions.  Widespread heavy calcification of the coronary arteries is evident. Upper normal left ventricular filling pressure (LVEDP 15 mmHg). Mild-moderate aortic valve stenosis  (peak-to-peak gradient 21 mmHg). Recommendations: Outpatient cardiac surgery consultation for CABG. Aggressive secondary prevention of coronary artery disease. Continue dual antiplatelet therapy with aspirin and clopidogrel pending cardiac surgery evaluation and input from vascular surgery regarding interruption of clopidogrel in the setting of lower extremity interventions earlier this year. Nelva Bush, MD [facility]  DG Chest Port 1 View  Result Date: 10/27/2021 CLINICAL DATA:  Reason for exam: pneumothorax Patient denies any sob, with slight chest pains. Hx of AVR and CABG x3 10/25/2021, patch angioplasty 06/05/2021. EXAM: PORTABLE CHEST 1 VIEW COMPARISON:  10/26/2021 and older exams. FINDINGS: Stable cardiac silhouette.  No mediastinal widening. Mild opacity at the lung bases, left greater than right, without convincing change from the previous day's study, consistent with small effusions and atelectasis. Remainder of the lungs is clear. No pneumothorax. Right internal jugular central venous line, mediastinal tube and left inferior me thorax chest tube are stable. IMPRESSION: 1. No acute findings or evidence of an operative complication. 2. Mild, left greater than right, lung base atelectasis with suspected small effusions. 3. Remaining support apparatus is stable and well positioned. Electronically Signed   By: Lajean Manes M.D.   On: 10/27/2021 08:12   DG Chest Port 1 View  Result Date: 10/26/2021 CLINICAL DATA:  Post aortic valve replacement and CABG EXAM: PORTABLE CHEST 1 VIEW COMPARISON:  10/25/2021 FINDINGS: Right IJ central line tip overlies the SVC. Endotracheal tube has been removed. Mediastinal drains remain present. Probable bibasilar atelectasis. Stable cardiomediastinal contours. No pneumothorax. IMPRESSION: Lines and tubes as above.  Probable bibasilar atelectasis. Electronically Signed   By: Macy Mis M.D.   On: 10/26/2021 08:07   DG Chest Port 1 View  Result Date:  10/25/2021 CLINICAL DATA:  Aortic valve replacement EXAM: PORTABLE CHEST 1 VIEW COMPARISON:  10/24/2021 FINDINGS: Interval postsurgical changes to the chest from aortic valve replacement. ET tube terminates approximately 4 cm above the carina. Right IJ central venous catheter terminates at the level of the mid SVC. Mediastinal drains are seen. Heart size within normal limits. Aortic atherosclerosis. Lungs are clear. No pleural effusion or pneumothorax. IMPRESSION: Interval postsurgical changes to the chest from aortic valve replacement. No acute findings. Electronically Signed   By: Davina Poke D.O.   On: 10/25/2021 14:47   VAS [place] LOWER EXT SAPHENOUS VEIN MAPPING  Result Date: 10/24/2021 LOWER EXTREMITY VEIN MAPPING Patient Name:  RESEAN BRANDER  Date of Exam:   10/24/2021 Medical Rec #: [id number]       Accession #:    [id number] Date of Birth: March 09, 1951        Patient Gender: M Patient Age:   51 years Exam Location:  [facility] Procedure:      VAS [place] LOWER EXTREMITY SAPHENOUS VEIN MAPPING Referring Phys: HARRELL LIGHTFOOT --------------------------------------------------------------------------------  Indications:        Pre-op Risk Factors:       Coronary artery disease. Other Risk Factors: History of left lower  extremity bypass graft 06/05/2021.  Comparison Study: 11/229/2022- lower extremity vein mapping Performing Technologist: Maudry Mayhew MHA, RDMS, RVT, RDCS  Examination Guidelines: A complete evaluation includes B-mode imaging, spectral Doppler, color Doppler, and power Doppler as needed of all accessible portions of each vessel. Bilateral testing is considered an integral part of a complete examination. Limited examinations for reoccurring indications may be performed as noted. +---------------+-----------+----------------------+---------------+-----------+   RT Diameter  RT Findings         GSV            LT Diameter  LT Findings      (cm)                                             (cm)                  +---------------+-----------+----------------------+---------------+-----------+      0.65                     Saphenofemoral         0.58                                                   Junction                                  +---------------+-----------+----------------------+---------------+-----------+      0.22                     Proximal thigh         0.25                  +---------------+-----------+----------------------+---------------+-----------+      0.26       branching       Mid thigh            0.25                  +---------------+-----------+----------------------+---------------+-----------+      0.21                      Distal thigh          0.16       branching  +---------------+-----------+----------------------+---------------+-----------+      0.25                          Knee              0.20                  +---------------+-----------+----------------------+---------------+-----------+      0.21                       Prox calf            0.16       branching  +---------------+-----------+----------------------+---------------+-----------+      0.14       branching        Mid calf            0.15                  +---------------+-----------+----------------------+---------------+-----------+  0.20                      Distal calf           0.13                  +---------------+-----------+----------------------+---------------+-----------+      0.09                         Ankle              0.08       branching  +---------------+-----------+----------------------+---------------+-----------+ +----------------+-----------+---------------+----------------+-----------+ RT diameter (cm)RT Findings      SSV      LT Diameter (cm)LT Findings +----------------+-----------+---------------+----------------+-----------+       0.15                 Popliteal fossa      0.17                   +----------------+-----------+---------------+----------------+-----------+       0.15                  Proximal calf       0.16                  +----------------+-----------+---------------+----------------+-----------+       0.22                    Mid calf          0.22       branching  +----------------+-----------+---------------+----------------+-----------+       0.23       branching   Distal calf        0.16                  +----------------+-----------+---------------+----------------+-----------+ Diagnosing physician: Deitra Mayo MD Electronically signed by Deitra Mayo MD on 10/24/2021 at 10:51:25 AM.    Final    ECHO INTRAOPERATIVE TEE  Result Date: 10/27/2021  *INTRAOPERATIVE TRANSESOPHAGEAL REPORT *  Patient Name:   JADER DESAI Date of Exam: 10/25/2021 Medical Rec #:  [id number]      Height:       70.0 in Accession #:    [id number]     Weight:       168.0 lb Date of Birth:  Sep 25, 1950       BSA:          1.94 m Patient Age:    62 years       BP:           149/52 mmHg Patient Gender: M              HR:           58 bpm. Exam Location:  Anesthesiology Transesophogeal exam was perform intraoperatively during surgical procedure. Patient was closely monitored under general anesthesia during the entirety of examination. Indications:     Coronary Artery Disease, Aortic Valve Disease Sonographer:     Bernadene Person RDCS Performing Phys: [id number] Lucile Crater LIGHTFOOT Diagnosing Phys: Belenda Cruise Stoltzfus Complications: No known complications during this procedure. POST-OP IMPRESSIONS _ Left Ventricle: The left ventricle is unchanged from pre-bypass. _ Right Ventricle: The right ventricle appears unchanged from pre-bypass. _ Aorta: The aorta appears unchanged from pre-bypass. _ Aortic Valve: No stenosis present. A bioprosthetic bioprosthetic valve was placed, leaflets are freely mobile Manufactured by; Edwards Size; 36m. No regurgitation post repair. The gradient  recorded  across the prosthetic valve is within the expected range. _ Mitral Valve: The mitral valve appears unchanged from pre-bypass. _ Tricuspid Valve: The tricuspid valve appears unchanged from pre-bypass. _ Pulmonic Valve: The pulmonic valve appears unchanged from pre-bypass. _ Interatrial Septum: The interatrial septum appears unchanged from pre-bypass. _ Comments: S/P CABG X 3 with AVR. Valve seated appropriately, no leak or rock. Mean PG 76mHg. No new or worsening wall motion or valvular abnormalities. PRE-OP FINDINGS  Left Ventricle: The left ventricle has normal systolic function, with an ejection fraction of 60-65%. The cavity size was normal. No evidence of left ventricular regional wall motion abnormalities. There is no left ventricular hypertrophy. Left ventricular diastolic parameters are consistent with Grade II diastolic dysfunction (pseudonormalization). Right Ventricle: The right ventricle has normal systolic function. The cavity was normal. There is no increase in right ventricular wall thickness. Right ventricular systolic pressure is normal. Left Atrium: Left atrial size was normal in size. No left atrial/left atrial appendage thrombus was detected. Left atrial appendage velocity is reduced at less than 40 cm/s. Right Atrium: Right atrial size was normal in size. Interatrial Septum: No atrial level shunt detected by color flow Doppler. There is no evidence of a patent foramen ovale. Pericardium: There is no evidence of pericardial effusion. There is no pleural effusion. Mitral Valve: The mitral valve is normal in structure. Mitral valve regurgitation is mild by color flow Doppler. The MR jet is centrally-directed. There is no evidence of mitral valve vegetation. There is No evidence of mitral stenosis. Tricuspid Valve: The tricuspid valve was normal in structure. Tricuspid valve regurgitation is trivial by color flow Doppler. No evidence of tricuspid stenosis is present. There is no evidence of  tricuspid valve vegetation. Aortic Valve: The aortic valve is tricuspid Aortic valve regurgitation is mild by color flow Doppler. The jet is posteriorly-directed. There is moderate stenosis of the aortic valve, with a calculated valve area of 1.12 cm. There is no evidence of aortic valve vegetation. There is moderate thickening and moderate calcification present on the aortic valve right coronary, left coronary and non-coronary cusps with mildly decreased mobility. VR .27. Pulmonic Valve: The pulmonic valve was normal in structure, with normal. No evidence of pumonic stenosis. Pulmonic valve regurgitation is not visualized by color flow Doppler. Aorta: The aortic root and ascending aorta are normal in size and structure. There is evidence of plaque in the descending aorta; Grade III, measuring 3-543min size. Pulmonary Artery: The pulmonary artery is of normal size. Venous: The inferior vena cava is normal in size with greater than 50% respiratory variability, suggesting right atrial pressure of 3 mmHg. Shunts: There is no evidence of an atrial septal defect. +--------------+--------++ LEFT VENTRICLE          +----------------+---------++ +--------------+--------++  Diastology                PLAX 2D                 +----------------+---------++ +--------------+--------++  LV e' lateral:  8.13 cm/s LVIDd:        4.70 cm   +----------------+---------++ +--------------+--------++  LV E/e' lateral:9.0       LVIDs:        2.60 cm   +----------------+---------++ +--------------+--------++  LV e' medial:   5.66 cm/s LV PW:        0.90 cm   +----------------+---------++ +--------------+--------++  LV E/e' medial: 12.9      LV IVS:       1.00  cm   +----------------+---------++ +--------------+--------++ LVOT diam:    2.20 cm  +--------------+--------++ LV SV:        78 ml    +--------------+--------++ LV SV Index:  39.97    +--------------+--------++ LVOT Area:     3.80 cm +--------------+--------++                        +--------------+--------++ +---------------+------+-------+ RIGHT VENTRICLE              +---------------+------+-------+ TAPSE (M-mode):2.0 cm2.37 cm +---------------+------+-------+ +------------------+------------++ AORTIC VALVE                   +------------------+------------++ AV Area (Vmax):   1.15 cm     +------------------+------------++ AV Area (Vmean):  1.18 cm     +------------------+------------++ AV Area (VTI):    1.12 cm     +------------------+------------++ AV Vmax:          300.00 cm/s  +------------------+------------++ AV Vmean:         215.500 cm/s +------------------+------------++ AV VTI:           0.926 m      +------------------+------------++ AV Peak Grad:     36.0 mmHg    +------------------+------------++ AV Mean Grad:     21.0 mmHg    +------------------+------------++ LVOT Vmax:        90.75 cm/s   +------------------+------------++ LVOT Vmean:       66.650 cm/s  +------------------+------------++ LVOT VTI:         0.272 m      +------------------+------------++ LVOT/AV VTI ratio:0.29         +------------------+------------++  +--------------+-------++ AORTA                 +--------------+-------++ Ao Sinus diam:2.90 cm +--------------+-------++ Ao STJ diam:  2.3 cm  +--------------+-------++ Ao Asc diam:  3.30 cm +--------------+-------++ +--------------+----------++ MITRAL VALVE              +--------------+-------+ +--------------+----------++  SHUNTS                MV Area (PHT):3.17 cm    +--------------+-------+ +--------------+----------++  Systemic VTI: 0.27 m  MV Peak grad: 2.1 mmHg    +--------------+-------+ +--------------+----------++  Systemic Diam:2.20 cm MV Mean grad: 1.0 mmHg    +--------------+-------+ +--------------+----------++ MV Vmax:      0.73 m/s   +--------------+----------++ MV Vmean:      34.3 cm/s  +--------------+----------++ MV VTI:       0.28 m     +--------------+----------++ MV PHT:       69.31 msec +--------------+----------++ MV Decel Time:239 msec   +--------------+----------++ +--------------+----------++ MV E velocity:72.90 cm/s +--------------+----------++ MV A velocity:52.40 cm/s +--------------+----------++ MV E/A ratio: 1.39       +--------------+----------++  Rochele Pages Electronically signed by Rochele Pages Signature Date/Time: 10/27/2021/10:12:15 PM    Final    VAS US DOPPLER PRE CABG  Result Date: 10/24/2021 PREOPERATIVE VASCULAR EVALUATION Patient Name:  OLUSEGUN GERSTENBERGER  Date of Exam:   10/24/2021 Medical Rec #: [id number]       Accession #:    [id number] Date of Birth: 06/07/1950        Patient Gender: M Patient Age:   71 years Exam Location:  [facility] Procedure:      VAS US DOPPLER PRE CABG Referring Phys: HARRELL LIGHTFOOT --------------------------------------------------------------------------------  Indications:            Pre-CABG. Risk Factors:  Hypertension, hyperlipidemia, Diabetes, past history of                         smoking, PAD. Vascular Interventions: 06/05/2021 left lower extremity bypass graft                         08/24/2019- right carotid endarterectomy. Comparison Study:       02/15/2021 ABI/TBI- Right=0.76/0.41, left=0.41/0.23                         07/12/2020 Carotid artery duplex- Right 1-39% CCA                         stenosis, Right >50% mid CCA stenosis, left 1-39% ICA                         stenosis. Performing Technologist: Maudry Mayhew MHA, RVT, RDCS, RDMS  Examination Guidelines: A complete evaluation includes B-mode imaging, spectral Doppler, color Doppler, and power Doppler as needed of all accessible portions of each vessel. Bilateral testing is considered an integral part of a complete examination. Limited examinations for reoccurring indications may be performed as noted.   Right Carotid Findings: +----------+--------+--------+--------+------------+-----------------------+           PSV cm/sEDV cm/sStenosisDescribe    Comments                +----------+--------+--------+--------+------------+-----------------------+ CCA Prox  64      8                                                   +----------+--------+--------+--------+------------+-----------------------+ CCA Distal135     17      >50%                turbulent at patch site +----------+--------+--------+--------+------------+-----------------------+ ICA Prox  116     14                                                  +----------+--------+--------+--------+------------+-----------------------+ ICA Distal119     24                                                  +----------+--------+--------+--------+------------+-----------------------+ ECA       139                     heterogenous                        +----------+--------+--------+--------+------------+-----------------------+ +----------+--------+-------+----------------+------------+           PSV cm/sEDV cmsDescribe        Arm Pressure +----------+--------+-------+----------------+------------+ Subclavian87             Multiphasic, WNL             +----------+--------+-------+----------------+------------+ +---------+--------+--+--------+--+---------+ VertebralPSV cm/s88EDV cm/s23Antegrade +---------+--------+--+--------+--+---------+ Left Carotid Findings: +----------+--------+--------+--------+-------------------------+---------+           PSV cm/sEDV cm/sStenosisDescribe  Comments  +----------+--------+--------+--------+-------------------------+---------+ CCA Prox  104     20              smooth and heterogenous            +----------+--------+--------+--------+-------------------------+---------+ CCA Distal94      20                                                  +----------+--------+--------+--------+-------------------------+---------+ ICA Prox  69      18              heterogenous and calcificShadowing +----------+--------+--------+--------+-------------------------+---------+ ICA Distal138     46                                                 +----------+--------+--------+--------+-------------------------+---------+ ECA       117     16              smooth and heterogenous            +----------+--------+--------+--------+-------------------------+---------+ +----------+--------+--------+----------------+------------+ SubclavianPSV cm/sEDV cm/sDescribe        Arm Pressure +----------+--------+--------+----------------+------------+           153             Multiphasic, WNL             +----------+--------+--------+----------------+------------+ +---------+--------+--+--------+--+---------+ VertebralPSV cm/s88EDV cm/s15Antegrade +---------+--------+--+--------+--+---------+  ABI Findings: +---------+------------------+-----+----------+--------+ Right    Rt Pressure (mmHg)IndexWaveform  Comment  +---------+------------------+-----+----------+--------+ Brachial 144                    triphasic          +---------+------------------+-----+----------+--------+ PTA      116               0.75 monophasic         +---------+------------------+-----+----------+--------+ DP       92                0.60 monophasic         +---------+------------------+-----+----------+--------+ Great Toe75                0.49                    +---------+------------------+-----+----------+--------+ +---------+------------------+-----+----------+-------+ Left     Lt Pressure (mmHg)IndexWaveform  Comment +---------+------------------+-----+----------+-------+ Brachial 154                    triphasic         +---------+------------------+-----+----------+-------+ PTA      140               0.91 triphasic          +---------+------------------+-----+----------+-------+ DP       149               0.97 monophasic        +---------+------------------+-----+----------+-------+ Great Toe108               0.70                   +---------+------------------+-----+----------+-------+ +-------+---------------+----------------+ ABI/TBIToday's ABI/TBIPrevious ABI/TBI +-------+---------------+----------------+ Right  0.75/0.49      0.76/0.41        +-------+---------------+----------------+ Left   0.97/0.70  0.41/0.23        +-------+---------------+----------------+  Right Doppler Findings: +-----------+--------+-----+---------+--------------------+ Site       PressureIndexDoppler  Comments             +-----------+--------+-----+---------+--------------------+ Brachial   144          triphasic                     +-----------+--------+-----+---------+--------------------+ Radial                  triphasic                     +-----------+--------+-----+---------+--------------------+ Ulnar                   biphasic                      +-----------+--------+-----+---------+--------------------+ Palmar Arch                      Within normal limits +-----------+--------+-----+---------+--------------------+  Left Doppler Findings: +-----------+--------+-----+---------+-----------------------------------------+ Site       PressureIndexDoppler  Comments                                  +-----------+--------+-----+---------+-----------------------------------------+ Brachial   154          triphasic                                          +-----------+--------+-----+---------+-----------------------------------------+ Radial                  biphasic                                           +-----------+--------+-----+---------+-----------------------------------------+ Ulnar                   biphasic                                            +-----------+--------+-----+---------+-----------------------------------------+ Palmar Arch                      Signal obliterates with radial                                             compression, is unaffected with ulnar                                      compression                               +-----------+--------+-----+---------+-----------------------------------------+  Summary: Right Carotid: Velocities in the right ICA are consistent with a 1-39% stenosis.                Hemodynamically significant plaque >50% visualized in the CCA. Left Carotid: Velocities in the  left ICA are consistent with a 1-39% stenosis. Vertebrals:  Bilateral vertebral arteries demonstrate antegrade flow. Subclavians: Normal flow hemodynamics were seen in bilateral subclavian              arteries. Right ABI: Resting right ankle-brachial index indicates moderate right lower extremity arterial disease. The right toe-brachial index is abnormal. No significant change when compared to prior study 02/15/2021. Left ABI: Resting left ankle-brachial index is within normal range. No evidence of significant left lower extremity arterial disease. The left toe-brachial index is normal. Findings improved when compared to prior study 02/15/2021. Right Upper Extremity: Doppler waveforms remain within normal limits with right radial compression. Doppler waveforms remain within normal limits with right ulnar compression. Left Upper Extremity: Doppler waveform obliterate with left radial compression. Doppler waveforms remain within normal limits with left ulnar compression.  Electronically signed by Deitra Mayo MD on 10/24/2021 at 10:51:13 AM.    Final      Treatments: surgery:  10/25/2021 Patient:  Luis Lindsey Pre-Op Dx: Coronary artery disease                         Moderate aortic stenosis                         Peripheral vascular disease                         Hypertension                          Hyperlipidemia                         Diabetes mellitus Post-op Dx: Same Procedure: CABG X 3.  LIMA to LAD, reverse saphenous vein graft to PDA, reverse saphenous vein graft to OM1. Coronary endarterectomy of the LAD Aortic valve replacement with a 23 mm Inspiris valve. Endoscopic greater saphenous vein harvest on the right     Surgeon and Role:      * Lightfoot, Lucile Crater, MD - Primary    *E. Barrett, PA-C- assisting  Discharge Exam: Blood pressure (!) 145/54, pulse 76, temperature 97.7 F (36.5 C), temperature source Oral, resp. rate 16, height '5\' 10"'$  (1.778 m), weight 77.7 kg, SpO2 100 %.  General appearance: alert, cooperative, and no distress Heart: regular rate and rhythm and soft syst murmur Lungs: min dim in right base Abdomen: benign Extremities: no edema Wound: incis healing well  Discharge Medications:  The patient has been discharged on:   1.Beta Blocker:  Yes [   ]                              No   [  n ]                              If No, reason:heart block post op so held  2.Ace Inhibitor/ARB: Yes [  y ]                                     No  [    ]  If No, reason:  3.Statin:   Yes [  y ]                  No  [   ]                  If No, reason:  4.Ecasa:  Yes  Blue.Reese   ]                  No   [   ]                  If No, reason:  Patient had ACS upon admission:n  Plavix/P2Y12 inhibitor: Yes [ y  ]                                      No  [   ]     Discharge Instructions     Discharge patient   Complete by: As directed    Discharge disposition: 01-Home or Self Care   Discharge patient date: 10/31/2021      Allergies as of 10/31/2021       Reactions   Codeine Nausea Only        Medication List     STOP taking these medications    carvedilol 12.5 MG tablet Commonly known as: COREG   ferrous sulfate 325 (65 FE) MG tablet   nitroGLYCERIN 0.4 MG SL tablet Commonly known as: Nitrostat        TAKE these medications    amLODipine-benazepril 10-40 MG capsule Commonly known as: LOTREL Take 1 capsule by mouth daily.   aspirin EC 81 MG tablet Take 1 tablet (81 mg total) by mouth daily at 6 (six) AM.   clopidogrel 75 MG tablet Commonly known as: PLAVIX TAKE 1 TABLET BY MOUTH EVERY DAY   ezetimibe 10 MG tablet Commonly known as: ZETIA Take 1 tablet (10 mg total) by mouth daily.   ferrous [id number] C-folic acid capsule Commonly known as: TRINSICON / FOLTRIN Take 1 capsule by mouth 2 (two) times daily after a meal.   FreeStyle Libre 2 Sensor Misc AS DIRECTED FOR BLOOD SUGAR CHECK DX E11.42   hydrochlorothiazide 12.5 MG tablet Commonly known as: HYDRODIURIL Take 1 tablet (12.5 mg total) by mouth daily.   insulin degludec 200 UNIT/ML FlexTouch Pen Commonly known as: TRESIBA Inject 34 Units into the skin in the morning. What changed: how much to take   metFORMIN 500 MG 24 hr tablet Commonly known as: GLUCOPHAGE-XR Take 2 tablets (1,000 mg total) by mouth 2 (two) times daily.   OneTouch Verio test strip Generic drug: glucose blood 1 each 3 (three) times daily.   pantoprazole 40 MG tablet Commonly known as: PROTONIX Take 40 mg by mouth daily with breakfast.   pioglitazone 45 MG tablet Commonly known as: ACTOS TAKE 1 TABLET DAILY (NEED APPOINTMENT FOR FURTHER REFILLS)   rosuvastatin 40 MG tablet Commonly known as: CRESTOR Take 40 mg by mouth daily.   traMADol 50 MG tablet Commonly known as: ULTRAM Take 1 tablet (50 mg total) by mouth every 6 (six) hours as needed for up to 7 days for moderate pain.         Signed:  John Giovanni, PA-C  10/31/2021, 9:27 AM

## 2021-10-28 LAB — CBC
HCT: 25 % — ABNORMAL LOW (ref 39.0–52.0)
Hemoglobin: 8 g/dL — ABNORMAL LOW (ref 13.0–17.0)
MCH: 28.3 pg (ref 26.0–34.0)
MCHC: 32 g/dL (ref 30.0–36.0)
MCV: 88.3 fL (ref 80.0–100.0)
Platelets: 167 10*3/uL (ref 150–400)
RBC: 2.83 MIL/uL — ABNORMAL LOW (ref 4.22–5.81)
RDW: 16.5 % — ABNORMAL HIGH (ref 11.5–15.5)
WBC: 8.8 10*3/uL (ref 4.0–10.5)
nRBC: 0 % (ref 0.0–0.2)

## 2021-10-28 LAB — BASIC METABOLIC PANEL
Anion gap: 4 — ABNORMAL LOW (ref 5–15)
BUN: 22 mg/dL (ref 8–23)
CO2: 25 mmol/L (ref 22–32)
Calcium: 7.8 mg/dL — ABNORMAL LOW (ref 8.9–10.3)
Chloride: 106 mmol/L (ref 98–111)
Creatinine, Ser: 1.05 mg/dL (ref 0.61–1.24)
GFR, Estimated: >60 mL/min (ref >60)
Glucose, Bld: 110 mg/dL — ABNORMAL HIGH (ref 70–99)
Potassium: 3.7 mmol/L (ref 3.5–5.1)
Sodium: 135 mmol/L (ref 135–145)

## 2021-10-28 LAB — GLUCOSE, CAPILLARY
Glucose-Capillary: 108 mg/dL — ABNORMAL HIGH (ref 70–99)
Glucose-Capillary: 111 mg/dL — ABNORMAL HIGH (ref 70–99)
Glucose-Capillary: 112 mg/dL — ABNORMAL HIGH (ref 70–99)
Glucose-Capillary: 123 mg/dL — ABNORMAL HIGH (ref 70–99)
Glucose-Capillary: 78 mg/dL (ref 70–99)

## 2021-10-28 MED ORDER — POTASSIUM CHLORIDE CRYS ER 20 MEQ PO TBCR
20.0000 meq | EXTENDED_RELEASE_TABLET | ORAL | Status: AC
  Administered 2021-10-28 (×3): 20 meq via ORAL
  Filled 2021-10-28 (×3): qty 1

## 2021-10-28 NOTE — Progress Notes (Signed)
3 Days Post-Op Procedure(s) (LRB): CORONARY ARTERY BYPASS GRAFTING (CABG) X 3 USING LEFT INTERNAL MAMMARY ARTERY AND RIGHT GREATER SAPHENOUS VEIN (N/A) AORTIC VALVE REPLACEMENT (AVR)USING 23MM INSPIRIS RESILIA  AORTIC VALVE (N/A) TRANSESOPHAGEAL ECHOCARDIOGRAM (TEE) (N/A) ENDOVEIN HARVEST OF GREATER SAPHENOUS VEIN (Right) Subjective: No complaints.  Objective: Vital signs in last 24 hours: Temp:  [98.5 F (36.9 C)-99.1 F (37.3 C)] 98.5 F (36.9 C) (06/03 0802) Pulse Rate:  [56-71] 62 (06/03 0800) Cardiac Rhythm: Normal sinus rhythm (06/03 0745) Resp:  [16-28] 21 (06/03 0800) BP: (104-165)/(44-89) 118/71 (06/03 0800) SpO2:  [92 %-99 %] 98 % (06/03 0800)  Hemodynamic parameters for last 24 hours:    Intake/Output from previous day: 06/02 0701 - 06/03 0700 In: 360 [P.O.:360] Out: 1600 [Urine:1600] Intake/Output this shift: Total I/O In: 120 [P.O.:120] Out: 200 [Urine:200]  General appearance: alert and cooperative Neurologic: intact Heart: regular rate and rhythm, S1, S2 normal, no murmur, click, rub or gallop Lungs: clear to auscultation bilaterally Extremities: edema mild Wound: incision healing well.  Lab Results: Recent Labs    10/27/21 0216 10/28/21 0043  WBC 7.8 8.8  HGB 8.0* 8.0*  HCT 24.9* 25.0*  PLT 158 167   BMET:  Recent Labs    10/27/21 0216 10/28/21 0043  NA 134* 135  K 4.0 3.7  CL 109 106  CO2 22 25  GLUCOSE 154* 110*  BUN 25* 22  CREATININE 1.13 1.05  CALCIUM 7.8* 7.8*    PT/INR:  Recent Labs    10/25/21 1440  LABPROT 17.1*  INR 1.4*   ABG    Component Value Date/Time   PHART 7.357 10/25/2021 2103   HCO3 19.6 (L) 10/25/2021 2103   TCO2 21 (L) 10/25/2021 2103   ACIDBASEDEF 5.0 (H) 10/25/2021 2103   O2SAT 99 10/25/2021 2103   CBG (last 3)  Recent Labs    10/27/21 1610 10/28/21 0001 10/28/21 0759  GLUCAP 129* 123* 111*    Assessment/Plan: S/P Procedure(s) (LRB): CORONARY ARTERY BYPASS GRAFTING (CABG) X 3 USING LEFT  INTERNAL MAMMARY ARTERY AND RIGHT GREATER SAPHENOUS VEIN (N/A) AORTIC VALVE REPLACEMENT (AVR)USING 23MM INSPIRIS RESILIA  AORTIC VALVE (N/A) TRANSESOPHAGEAL ECHOCARDIOGRAM (TEE) (N/A) ENDOVEIN HARVEST OF GREATER SAPHENOUS VEIN (Right)  POD 3 Hemodynamically stable  Rhythm is complete heart block with narrow complex junctional escape in the low 60's. He was in sinus initially postop. Will hold beta blocker and observe. His pacing wires are out.  Volume excess: Wt is 12 lbs over preop. Continue diuresis.  DM: glucose under good control on Levemir and SSI.  Waiting on 4E. Bed. Continue IS, ambulation.    LOS: 3 days    Gaye Pollack 10/28/2021

## 2021-10-28 NOTE — Progress Notes (Signed)
Pt admitted to [facility] from [facility].  Pt is A&O X4 and neuro intact.  Pt placed on telemetry and CCMD notified.  Vitals taken and within normal range, with exception of BP: 156/58 (86). Discussed with CCMD that Pt is moving between junctional and 3rd degree heart block.  Pt is asymptomatic and vitals stable.  PA paged and notified.  Orders to closely monitor.  Pt is currently comfortable and oriented to unit.  Call light within reach.      10/28/21 1315  Vitals  Temp 97.8 F (36.6 C)  Temp Source Oral  BP (!) 156/58  MAP (mmHg) 86  BP Location Left Arm  BP Method Automatic  Patient Position (if appropriate) Lying  Pulse Rate 61  Pulse Rate Source Monitor  ECG Heart Rate 63  Resp 20  Level of Consciousness  Level of Consciousness Alert  Oxygen Therapy  SpO2 98 %  O2 Device Nasal Cannula  O2 Flow Rate (L/min) 2 L/min  Patient Activity (if Appropriate) In bed  Pulse Oximetry Type Continuous  ECG Monitoring  PR interval 0.26  QRS interval 0.09  QT interval 0.41  QTc interval 0.42  CV Strip Heart Rate 63  Cardiac Rhythm Heart block;Junctional rhythm (back and forth between 3rd deg avb and Accelerated Junctional Rhythm)  Heart Block Type 3rd degree AVB  Pain Assessment  Pain Scale 0-10  Pain Score 3  Pain Location Chest  Pain Orientation Anterior  Pain Descriptors / Indicators Aching  Pain Frequency Intermittent  Pain Onset On-going  Multiple Pain Sites No  POSS Scale (Pasero Opioid Sedation Scale)  POSS *See Group Information* 1-Acceptable,Awake and alert  Glasgow Coma Scale  Eye Opening 4  Best Verbal Response (NON-intubated) 5  Best Motor Response 6  Glasgow Coma Scale Score 15  Height and Weight  Height '5\' 10"'$  (1.778 m)  Weight 80.1 kg  Type of Scale Used Bed  Type of Weight Stated  BSA (Calculated - sq m) 1.99 sq meters  BMI (Calculated) 25.34  Weight in (lb) to have BMI = 25 173.9  MEWS Score  MEWS Temp 0  MEWS Systolic 0  MEWS Pulse 0  MEWS RR 0  MEWS LOC 0   MEWS Score 0  MEWS Score Color Green

## 2021-10-29 LAB — BPAM RBC
Blood Product Expiration Date: 202306202359
Blood Product Expiration Date: 202306202359
Blood Product Expiration Date: 202306202359
Blood Product Expiration Date: 202306202359
ISSUE DATE / TIME: 202305310828
ISSUE DATE / TIME: 202306010738
Unit Type and Rh: 5100
Unit Type and Rh: 5100
Unit Type and Rh: 5100
Unit Type and Rh: 5100

## 2021-10-29 LAB — TYPE AND SCREEN
ABO/RH(D): O POS
Antibody Screen: NEGATIVE
Unit division: 0
Unit division: 0
Unit division: 0
Unit division: 0

## 2021-10-29 LAB — GLUCOSE, CAPILLARY
Glucose-Capillary: 105 mg/dL — ABNORMAL HIGH (ref 70–99)
Glucose-Capillary: 155 mg/dL — ABNORMAL HIGH (ref 70–99)
Glucose-Capillary: 158 mg/dL — ABNORMAL HIGH (ref 70–99)
Glucose-Capillary: 171 mg/dL — ABNORMAL HIGH (ref 70–99)

## 2021-10-29 MED ORDER — LACTULOSE 10 GM/15ML PO SOLN
20.0000 g | Freq: Once | ORAL | Status: AC
  Administered 2021-10-29: 20 g via ORAL
  Filled 2021-10-29: qty 30

## 2021-10-29 MED FILL — Heparin Sodium (Porcine) Inj 1000 Unit/ML: Qty: 1000 | Status: AC

## 2021-10-29 MED FILL — Potassium Chloride Inj 2 mEq/ML: INTRAVENOUS | Qty: 40 | Status: AC

## 2021-10-29 MED FILL — Lidocaine HCl Local Preservative Free (PF) Inj 2%: INTRAMUSCULAR | Qty: 15 | Status: AC

## 2021-10-29 NOTE — Progress Notes (Addendum)
[address]       [place] [address]             [phone]      4 Days Post-Op Procedure(s) (LRB): CORONARY ARTERY BYPASS GRAFTING (CABG) X 3 USING LEFT INTERNAL MAMMARY ARTERY AND RIGHT GREATER SAPHENOUS VEIN (N/A) AORTIC VALVE REPLACEMENT (AVR)USING 23MM INSPIRIS RESILIA  AORTIC VALVE (N/A) TRANSESOPHAGEAL ECHOCARDIOGRAM (TEE) (N/A) ENDOVEIN HARVEST OF GREATER SAPHENOUS VEIN (Right) Subjective: Awake and alert, feels he is progressing and has no new concerns. Tolerating p.o.'s, passing gas, no BM yet.  Objective: Vital signs in last 24 hours: Temp:  [97.8 F (36.6 C)-98.4 F (36.9 C)] 98.2 F (36.8 C) (06/04 0756) Pulse Rate:  [54-65] 65 (06/04 0756) Cardiac Rhythm: Junctional rhythm;Heart block (06/03 1900) Resp:  [17-21] 17 (06/04 0756) BP: (108-167)/(46-90) 141/48 (06/04 0756) SpO2:  [93 %-100 %] 100 % (06/04 0756) Weight:  [78.7 kg-80.1 kg] 78.7 kg (06/04 0649)  Hemodynamic parameters for last 24 hours:    Intake/Output from previous day: 06/03 0701 - 06/04 0700 In: 560 [P.O.:560] Out: 2100 [Urine:2100] Intake/Output this shift: Total I/O In: 240 [P.O.:240] Out: 700 [Urine:700]  General appearance: alert, cooperative, and no distress Neurologic: intact Heart: Complete heart block and junctional rhythm yesterday, now in sinus rhythm. Lungs: Breath sounds are clear to auscultation bilaterally. Abdomen: Soft and nontender Extremities: Well perfused, minimal peripheral edema.  Right lower extremity EVH incisions are well approximated and dry.  Lab Results: Recent Labs    10/27/21 0216 10/28/21 0043  WBC 7.8 8.8  HGB 8.0* 8.0*  HCT 24.9* 25.0*  PLT 158 167   BMET:  Recent Labs    10/27/21 0216 10/28/21 0043  NA 134* 135  K 4.0 3.7  CL 109 106  CO2 22 25  GLUCOSE 154* 110*  BUN 25* 22  CREATININE 1.13 1.05  CALCIUM 7.8* 7.8*    PT/INR: No results for input(s): LABPROT, INR in the last 72 hours. ABG    Component  Value Date/Time   PHART 7.357 10/25/2021 2103   HCO3 19.6 (L) 10/25/2021 2103   TCO2 21 (L) 10/25/2021 2103   ACIDBASEDEF 5.0 (H) 10/25/2021 2103   O2SAT 99 10/25/2021 2103   CBG (last 3)  Recent Labs    10/28/21 1612 10/28/21 2110 10/29/21 0608  GLUCAP 112* 78 105*    Assessment/Plan: S/P Procedure(s) (LRB): CORONARY ARTERY BYPASS GRAFTING (CABG) X 3 USING LEFT INTERNAL MAMMARY ARTERY AND RIGHT GREATER SAPHENOUS VEIN (N/A) AORTIC VALVE REPLACEMENT (AVR)USING 23MM INSPIRIS RESILIA  AORTIC VALVE (N/A) TRANSESOPHAGEAL ECHOCARDIOGRAM (TEE) (N/A) ENDOVEIN HARVEST OF GREATER SAPHENOUS VEIN (Right)  -Postop day 4 CABG x3 and aortic valve replacement for aortic stenosis, normal LVEF.  Junctional rhythm with complete heart block early postop.  It appears conduction has recovered he is now in stable sinus rhythm.  We have been holding beta-blocker.  -Volume excess-good response to Lasix with 3 pound weight loss from yesterday, still about 5 pounds positive from preop.  Continue diuresis.  -Pulm-working on pulmonary hygiene and O2 and wean.  -GI-tolerating p.o.'s, no BM since surgery.  Lactulose today.  -Type 2 diabetes mellitus-glucose well controlled on Levemir.  Not requiring sliding scale coverage.  -Expected acute blood loss anemia-stable at 25 yesterday.  Continue iron supplement.  Monitor.  -Cerebrovascular disease and peripheral vascular disease-history of CVA and TIAs.  Status post left femoral to popliteal bypass grafting earlier this year and right carotid endarterectomy in 2021.  On Plavix  -DVT  prophylaxis: On daily enoxaparin, ambulate   LOS: 4 days    Antony Odea, Hershal Coria [phone] 10/29/2021   Chart reviewed, patient examined, agree with above. Rhythm is sinus 61. No beta blocker. He feels well overall.

## 2021-10-30 ENCOUNTER — Other Ambulatory Visit: Payer: Self-pay | Admitting: Cardiology

## 2021-10-30 DIAGNOSIS — Z952 Presence of prosthetic heart valve: Secondary | ICD-10-CM

## 2021-10-30 LAB — BASIC METABOLIC PANEL
Anion gap: 4 — ABNORMAL LOW (ref 5–15)
BUN: 12 mg/dL (ref 8–23)
CO2: 26 mmol/L (ref 22–32)
Calcium: 7.8 mg/dL — ABNORMAL LOW (ref 8.9–10.3)
Chloride: 108 mmol/L (ref 98–111)
Creatinine, Ser: 1 mg/dL (ref 0.61–1.24)
GFR, Estimated: >60 mL/min (ref >60)
Glucose, Bld: 113 mg/dL — ABNORMAL HIGH (ref 70–99)
Potassium: 3.8 mmol/L (ref 3.5–5.1)
Sodium: 138 mmol/L (ref 135–145)

## 2021-10-30 LAB — CBC
HCT: 24.4 % — ABNORMAL LOW (ref 39.0–52.0)
Hemoglobin: 8 g/dL — ABNORMAL LOW (ref 13.0–17.0)
MCH: 29.1 pg (ref 26.0–34.0)
MCHC: 32.8 g/dL (ref 30.0–36.0)
MCV: 88.7 fL (ref 80.0–100.0)
Platelets: 226 10*3/uL (ref 150–400)
RBC: 2.75 MIL/uL — ABNORMAL LOW (ref 4.22–5.81)
RDW: 15.9 % — ABNORMAL HIGH (ref 11.5–15.5)
WBC: 6.1 10*3/uL (ref 4.0–10.5)
nRBC: 0.3 % — ABNORMAL HIGH (ref 0.0–0.2)

## 2021-10-30 LAB — GLUCOSE, CAPILLARY
Glucose-Capillary: 92 mg/dL (ref 70–99)
Glucose-Capillary: 177 mg/dL — ABNORMAL HIGH (ref 70–99)
Glucose-Capillary: 181 mg/dL — ABNORMAL HIGH (ref 70–99)
Glucose-Capillary: 209 mg/dL — ABNORMAL HIGH (ref 70–99)

## 2021-10-30 MED ORDER — AMLODIPINE BESYLATE 10 MG PO TABS
10.0000 mg | ORAL_TABLET | Freq: Every day | ORAL | Status: DC
  Administered 2021-10-30 – 2021-10-31 (×2): 10 mg via ORAL
  Filled 2021-10-30 (×2): qty 1

## 2021-10-30 MED ORDER — AMLODIPINE BESY-BENAZEPRIL HCL 10-40 MG PO CAPS: 1.0000 | ORAL_CAPSULE | Freq: Every day | ORAL | Status: DC

## 2021-10-30 MED ORDER — BENAZEPRIL HCL 40 MG PO TABS
40.0000 mg | ORAL_TABLET | Freq: Every day | ORAL | Status: DC
  Administered 2021-10-30 – 2021-10-31 (×2): 40 mg via ORAL
  Filled 2021-10-30: qty 8
  Filled 2021-10-30: qty 1
  Filled 2021-10-30: qty 8
  Filled 2021-10-30: qty 1

## 2021-10-30 NOTE — Care Management Important Message (Signed)
Important Message  Patient Details  Name: Luis Lindsey MRN: [id number] Date of Birth: 05-03-1951   Medicare Important Message Given:  Yes     Shelda Altes 10/30/2021, 11:02 AM

## 2021-10-30 NOTE — Progress Notes (Addendum)
[address]       [place] [address]             [phone]      5 Days Post-Op Procedure(s) (LRB): CORONARY ARTERY BYPASS GRAFTING (CABG) X 3 USING LEFT INTERNAL MAMMARY ARTERY AND RIGHT GREATER SAPHENOUS VEIN (N/A) AORTIC VALVE REPLACEMENT (AVR)USING 23MM INSPIRIS RESILIA  AORTIC VALVE (N/A) TRANSESOPHAGEAL ECHOCARDIOGRAM (TEE) (N/A) ENDOVEIN HARVEST OF GREATER SAPHENOUS VEIN (Right) Subjective: Conts to feel better  Objective: Vital signs in last 24 hours: Temp:  [97.7 F (36.5 C)-98.4 F (36.9 C)] 98.2 F (36.8 C) (06/05 0000) Pulse Rate:  [57-71] 71 (06/05 0000) Cardiac Rhythm: Normal sinus rhythm;Heart block (06/04 1900) Resp:  [16-20] 19 (06/05 0000) BP: (118-184)/(44-58) 149/58 (06/05 0000) SpO2:  [98 %-100 %] 99 % (06/05 0000) Weight:  [77.7 kg] 77.7 kg (06/05 0145)  Hemodynamic parameters for last 24 hours:    Intake/Output from previous day: 06/04 0701 - 06/05 0700 In: 600 [P.O.:600] Out: 2200 [Urine:2200] Intake/Output this shift: No intake/output data recorded.  General appearance: alert, cooperative, and no distress Heart: regular rate and rhythm Lungs: mildly dim in right base Abdomen: benign Extremities: no edema Wound: incis healing well Soft systolic murmur Lab Results: Recent Labs    10/28/21 0043 10/30/21 0238  WBC 8.8 6.1  HGB 8.0* 8.0*  HCT 25.0* 24.4*  PLT 167 226   BMET:  Recent Labs    10/28/21 0043 10/30/21 0238  NA 135 138  K 3.7 3.8  CL 106 108  CO2 25 26  GLUCOSE 110* 113*  BUN 22 12  CREATININE 1.05 1.00  CALCIUM 7.8* 7.8*    PT/INR: No results for input(s): LABPROT, INR in the last 72 hours. ABG    Component Value Date/Time   PHART 7.357 10/25/2021 2103   HCO3 19.6 (L) 10/25/2021 2103   TCO2 21 (L) 10/25/2021 2103   ACIDBASEDEF 5.0 (H) 10/25/2021 2103   O2SAT 99 10/25/2021 2103   CBG (last 3)  Recent Labs    10/29/21 1101 10/29/21 1630 10/29/21 2110  GLUCAP 155* 158* 171*     Meds Scheduled Meds:  acetaminophen  1,000 mg Oral Q6H   Or   acetaminophen (TYLENOL) oral liquid 160 mg/5 mL  1,000 mg Per Tube Q6H   aspirin EC  325 mg Oral Daily   Or   aspirin  324 mg Per Tube Daily   bisacodyl  10 mg Oral Daily   Or   bisacodyl  10 mg Rectal Daily   Chlorhexidine Gluconate Cloth  6 each Topical Daily   clopidogrel  75 mg Oral Daily   docusate sodium  200 mg Oral Daily   enoxaparin (LOVENOX) injection  40 mg Subcutaneous QHS   ezetimibe  10 mg Oral Daily   ferrous [id number] C-folic acid  1 capsule Oral BID PC   furosemide  40 mg Oral Daily   insulin aspart  0-24 Units Subcutaneous TID AC & HS   insulin detemir  20 Units Subcutaneous Daily   mouth rinse  15 mL Mouth Rinse BID   pantoprazole  40 mg Oral Q breakfast   polyethylene glycol  17 g Oral Daily   potassium chloride  20 mEq Oral Daily   rosuvastatin  40 mg Oral Daily   sodium chloride flush  10-40 mL Intracatheter Q12H   sodium chloride flush  3 mL Intravenous Q12H   sodium chloride flush  3 mL Intravenous Q12H   Continuous Infusions:  sodium chloride     sodium chloride     sodium chloride     sodium chloride     lactated ringers     PRN Meds:.sodium chloride, sodium chloride, dextrose, morphine injection, ondansetron (ZOFRAN) IV, oxyCODONE, sodium chloride flush, sodium chloride flush, sodium chloride flush, traMADol  Xrays No results found.  Assessment/Plan: S/P Procedure(s) (LRB): CORONARY ARTERY BYPASS GRAFTING (CABG) X 3 USING LEFT INTERNAL MAMMARY ARTERY AND RIGHT GREATER SAPHENOUS VEIN (N/A) AORTIC VALVE REPLACEMENT (AVR)USING 23MM INSPIRIS RESILIA  AORTIC VALVE (N/A) TRANSESOPHAGEAL ECHOCARDIOGRAM (TEE) (N/A) ENDOVEIN HARVEST OF GREATER SAPHENOUS VEIN (Right)  POD#5 1 afeb, SBP quite variable 110's-180's, mostly elevated , resume lotrel, SR w/1 deg AVB,  no beta blocker currently 2 sats ok on 2 liters 3 good UOP- weight down 1 KG from yesterday- normal renal fxn,  diurese one more day 4 expected ABLA is fairly stable, on Iron 5 BS control is reasonable, transition to home meds at d/c  6 on ASA/plavix, has ECCVOD/ PAD hx 7 He had BM 8 cont pulm Hygiene and rehab 9  home 1-2 days if off O2    LOS: 5 days    John Giovanni PA-C Pager [phone] 10/30/2021    Agree with above Weaning O2 Dispo planning  Higinio Grow O Ryaan Vanwagoner

## 2021-10-30 NOTE — Progress Notes (Signed)
CARDIAC REHAB PHASE I   PRE:  Rate/Rhythm: 70 NSR w/1 deg AVB  BP:  Sitting: 157/54      SaO2: 94 RA  MODE:  Ambulation: 470 ft   POST:  Rate/Rhythm: 150/60 w/1 deg AVB BP:  Sitting: 150/60      SaO2: 100 RA  Seen pt from 1110-1151, pt agreed to be taken off O2 prior to ambulation. Pt walked with 6OE & RW down hallway with minimal assistance and light assistance from gait belt. Pt gait is steadier with DME, while walking pt is slow and steady. At 230 ft SaO2 was 100. Pt was returned to chair and encouraged to continue walking and using IS. Discharge video was put on pt tv prior to leaving.   Christen Bame  11:40 AM 10/30/2021

## 2021-10-30 NOTE — Progress Notes (Signed)
Mobility Specialist: Progress Note   10/30/21 1510  Mobility  Activity Ambulated with assistance in hallway  Level of Assistance Contact guard assist, steadying assist  Assistive Device Front wheel walker  Distance Ambulated (ft) 470 ft  Activity Response Tolerated well  $Mobility charge 1 Mobility   Pre-Mobility: 73 HR, 100% SpO2 Post-Mobility: 86 HR, 97% SpO2  Received pt in bed having no complaints and agreeable to mobility. Asymptomatic throughout ambulation, sitting EOB w/ call bell in reach and all needs met. Family present in the room.   [facility] Luis Lindsey Mobility Specialist Mobility Specialist 5 North: [phone] Mobility Specialist 6 North: [phone]

## 2021-10-30 NOTE — Plan of Care (Signed)
  Problem: Health Behavior/Discharge Planning: Goal: Ability to manage health-related needs will improve Outcome: Progressing   Problem: Clinical Measurements: Goal: Ability to maintain clinical measurements within normal limits will improve Outcome: Progressing Goal: Will remain free from infection Outcome: Progressing   Problem: Activity: Goal: Risk for activity intolerance will decrease Outcome: Progressing   

## 2021-10-31 ENCOUNTER — Other Ambulatory Visit: Payer: Self-pay | Admitting: Physician Assistant

## 2021-10-31 LAB — GLUCOSE, CAPILLARY: Glucose-Capillary: 154 mg/dL — ABNORMAL HIGH (ref 70–99)

## 2021-10-31 MED ORDER — TRAMADOL HCL 50 MG PO TABS: 50.0000 mg | ORAL_TABLET | Freq: Four times a day (QID) | ORAL | 0 refills | Status: AC | PRN

## 2021-10-31 MED ORDER — HYDROCHLOROTHIAZIDE 12.5 MG PO TABS
12.5000 mg | ORAL_TABLET | Freq: Every day | ORAL | Status: DC
  Administered 2021-10-31: 12.5 mg via ORAL
  Filled 2021-10-31: qty 1

## 2021-10-31 MED ORDER — HYDROCHLOROTHIAZIDE 12.5 MG PO TABS: 12.5000 mg | ORAL_TABLET | Freq: Every day | ORAL | 1 refills | Status: DC

## 2021-10-31 MED ORDER — INSULIN DEGLUDEC 200 UNIT/ML ~~LOC~~ SOPN: 33.0000 [IU] | PEN_INJECTOR | Freq: Every morning | SUBCUTANEOUS | Status: AC

## 2021-10-31 MED ORDER — FE FUMARATE-B12-VIT C-FA-IFC PO CAPS: 1.0000 | ORAL_CAPSULE | Freq: Two times a day (BID) | ORAL | 1 refills | Status: DC

## 2021-10-31 NOTE — Progress Notes (Signed)
Patient given discharge instructions. PIV removed. Telemetry box removed, CCMD notified. Patient taken to vehicle in wheelchair by staff.  Chrisma Hurlock L Priest Lockridge, RN  

## 2021-10-31 NOTE — Progress Notes (Signed)
Discussed with pt and family IS, sternal precautions, diet, exercise, and CRPII. Pt receptive. He has been working on A1C. Will refer to [place]. [id number] [place], ACSM 10/31/2021 10:53 AM

## 2021-10-31 NOTE — Progress Notes (Signed)
error 

## 2021-10-31 NOTE — Progress Notes (Signed)
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  AORTIC VALVE (N/A) TRANSESOPHAGEAL ECHOCARDIOGRAM (TEE) (N/A) ENDOVEIN HARVEST OF GREATER SAPHENOUS VEIN (Right) Subjective: Feels well  Objective: Vital signs in last 24 hours: Temp:  [97.8 F (36.6 C)-98.3 F (36.8 C)] 98.3 F (36.8 C) (06/06 0604) Pulse Rate:  [57-82] 82 (06/06 0630) Cardiac Rhythm: Normal sinus rhythm;Heart block (06/05 1915) Resp:  [15-20] 18 (06/06 0630) BP: (118-162)/(49-73) 145/54 (06/06 0630) SpO2:  [97 %-100 %] 98 % (06/06 0604)  Hemodynamic parameters for last 24 hours:    Intake/Output from previous day: 06/05 0701 - 06/06 0700 In: 351.8 [P.O.:340; I.V.:11.8] Out: -  Intake/Output this shift: No intake/output data recorded.  General appearance: alert, cooperative, and no distress Heart: regular rate and rhythm and soft syst murmur Lungs: min dim in right base Abdomen: benign Extremities: no edema Wound: incis healing well  Lab Results: Recent Labs    10/30/21 0238  WBC 6.1  HGB 8.0*  HCT 24.4*  PLT 226   BMET:  Recent Labs    10/30/21 0238  NA 138  K 3.8  CL 108  CO2 26  GLUCOSE 113*  BUN 12  CREATININE 1.00  CALCIUM 7.8*    PT/INR: No results for input(s): LABPROT, INR in the last 72 hours. ABG    Component Value Date/Time   PHART 7.357 10/25/2021 2103   HCO3 19.6 (L) 10/25/2021 2103   TCO2 21 (L) 10/25/2021 2103   ACIDBASEDEF 5.0 (H) 10/25/2021 2103   O2SAT 99 10/25/2021 2103   CBG (last 3)  Recent Labs    10/30/21 1545 10/30/21 2135 10/31/21 0606  GLUCAP 181* 209* 154*    Meds Scheduled Meds:  amLODipine  10 mg Oral Daily   And   benazepril  40 mg Oral Daily   aspirin EC  325 mg Oral Daily   Or    aspirin  324 mg Per Tube Daily   bisacodyl  10 mg Oral Daily   Or   bisacodyl  10 mg Rectal Daily   Chlorhexidine Gluconate Cloth  6 each Topical Daily   clopidogrel  75 mg Oral Daily   docusate sodium  200 mg Oral Daily   enoxaparin (LOVENOX) injection  40 mg Subcutaneous QHS   ezetimibe  10 mg Oral Daily   ferrous [id number] C-folic acid  1 capsule Oral BID PC   furosemide  40 mg Oral Daily   insulin aspart  0-24 Units Subcutaneous TID AC & HS   insulin detemir  20 Units Subcutaneous Daily   mouth rinse  15 mL Mouth Rinse BID   pantoprazole  40 mg Oral Q breakfast   polyethylene glycol  17 g Oral Daily   potassium chloride  20 mEq Oral Daily   rosuvastatin  40 mg Oral Daily   sodium chloride flush  10-40 mL Intracatheter Q12H   sodium chloride flush  3 mL Intravenous Q12H   sodium chloride flush  3 mL Intravenous Q12H   Continuous Infusions:  sodium chloride     sodium chloride     sodium chloride     sodium chloride     lactated ringers  PRN Meds:.sodium chloride, sodium chloride, dextrose, morphine injection, ondansetron (ZOFRAN) IV, oxyCODONE, sodium chloride flush, sodium chloride flush, sodium chloride flush, traMADol  Xrays No results found.  Assessment/Plan: S/P Procedure(s) (LRB): CORONARY ARTERY BYPASS GRAFTING (CABG) X 3 USING LEFT INTERNAL MAMMARY ARTERY AND RIGHT GREATER SAPHENOUS VEIN (N/A) AORTIC VALVE REPLACEMENT (AVR)USING 23MM INSPIRIS RESILIA  AORTIC VALVE (N/A) TRANSESOPHAGEAL ECHOCARDIOGRAM (TEE) (N/A) ENDOVEIN HARVEST OF GREATER SAPHENOUS VEIN (Right)  POD#6 1 afeb, VSS S BP mostly in 140 range, back on lotrel, not on beta blocker,will add HCTZ .His BP typically is high at home when he measures, 263-335'K systolic Rhythm-  sinus with 1 deg AVB, had complete heart block early post op 2 off O2 3 UOP not measured, voiding well 4 no new labs or CXR's 5 on ASA/plavix 6 CBG control is reasonable for inpatient , will discuss slow transition  to home meds as appetite and activity improve 7 he is stable for D/C    LOS: 6 days    John Giovanni PA-C Pager [phone] 10/31/2021

## 2021-11-02 ENCOUNTER — Telehealth: Payer: Self-pay | Admitting: Cardiology

## 2021-11-02 NOTE — Telephone Encounter (Signed)
Patients states he was returning call. Please advise

## 2021-11-02 NOTE — Telephone Encounter (Signed)
Pt aware I do not see who called or why. He thinks her name was Luis Lindsey, but not sure.  (Checked with Luis Lindsey, our echo scheduler, but she did not call pt). Aware I will forward to [place] to address when she returns to the office. Patient verbalized understanding and agreeable to plan.

## 2021-11-03 ENCOUNTER — Ambulatory Visit (INDEPENDENT_AMBULATORY_CARE_PROVIDER_SITE_OTHER): Payer: Self-pay | Admitting: Thoracic Surgery (Cardiothoracic Vascular Surgery)

## 2021-11-03 DIAGNOSIS — Z952 Presence of prosthetic heart valve: Secondary | ICD-10-CM

## 2021-11-03 NOTE — Progress Notes (Signed)
     [address]       [place] [address]             [phone]       Patient: Home Provider: Office Consent for Telemedicine visit obtained.  Today's visit was completed via a real-time telehealth (see specific modality noted below). The patient/authorized person provided oral consent at the time of the visit to engage in a telemedicine encounter with the present provider at [facility]. The patient/authorized person was informed of the potential benefits, limitations, and risks of telemedicine. The patient/authorized person expressed understanding that the laws that protect confidentiality also apply to telemedicine. The patient/authorized person acknowledged understanding that telemedicine does not provide emergency services and that he or she would need to call 911 or proceed to the nearest hospital for help if such a need arose.   Total time spent in the clinical discussion 10 minutes.  Telehealth Modality: Phone visit (audio only)  I had a telephone visit with  Luis Lindsey who is s/p AVR/CABG.  Overall doing well.   Pain is slowly improving.  Ambulating well. Vitals have been stable.  Luis Lindsey will see [place] back in 1 month with a chest x-ray for cardiac rehab clearance.  Robey Massmann [name]

## 2021-11-03 NOTE — Telephone Encounter (Signed)
Spoke with the pt and informed him that Dr. Johney Frame nor myself tried calling him. I do not see any documentation from our office where anyone tried calling him. Informed him that it may have been Dr. Abran Duke office calling him, being he has a telephone virtual visit with them today at 2 pm.  Advised him he could touch base with them before his appt to ask if they tried calling him. Pt verbalized understanding and agrees with this plan.

## 2021-11-14 NOTE — Progress Notes (Signed)
Cardiology Office Note:    Date:  11/16/2021   ID:  MOMODOU CONSIGLIO, DOB Apr 24, 1951, MRN [id number]  PCP:  Aurea Graff.[name], MD   [facility] Providers Cardiologist:  Freada Bergeron, MD     Referring MD: Aurea Graff.[name], MD   Chief Complaint: hospital follow-up CABG, AVR  History of Present Illness:    CARTRELL BENTSEN is a very pleasant 71 y.o. male with a hx of CAD s/p AVR and CABG 10/25/21, aortic stenosis, bilateral carotid disease s/p L CEA, CVA,  hypertension, PAD s/p L SFA atherectomy with angioplasty, hyperlipidemia, and tobacco use.   Patient initially seen 03/2020 after having episodes of lightheadedness in the setting of anemia, moderate AAS, and relative hypotension.  We stopped his HCTZ and decreased his metoprolol to 25 mg daily.  Symptoms resolved.  TTE 08/2019 revealed normal EF, moderate aortic valve stenosis.  Aortic valve area by VTI measures 1.21 cm.  Aortic valve mean gradient measures 14.5 mmHg, aortic valve V-max measures 2.76 m/s.  Had abdominal aortogram in 02/2021 with Dr. Carlis Abbott where he was found to have ostial high-grade SFA stenosis.  Was recommended for bypass at that time.  He subsequently underwent left common femoral endarterectomy with profundoplasty and endarterectomy of the proximal SFA with bovine pericardial patch angioplasty and left common femoral to below-knee popliteal artery bypass on 06/05/2021 without issues.  He was discharged on aspirin and Plavix.  Seen by Dr. Johney Frame on 08/28/2021. Reported an episode of substernal chest pressure radiating down his left arm with associated SOB while making the bed 3 weeks prior.  Symptoms resolved after sitting and resting for 15 minutes.  Had intermittent chest discomfort since that time.  Coronary CTA was ordered which revealed coronary artery calcium score 08/28/2002, 98th percentile for age and gender.  Aortic valve calcium score 1928, very heavy proximal to mid coronary calcification so difficult to quantify  degree of stenosis.  Suspicion of severe ostial RCA stenosis as well.  Proximal LAD, ramus, and LCx appear moderately stenosed.  Possible severe mid LCx stenosis with FFR that revealed multiple hemodynamically significant stenoses. He was advised to return to our office for discussion of cardiac catheterization.   Office visit on 10/03/2021 by Robbie Lis, PA with worsening chest pain concerning for angina. Cardiac catheterization was scheduled for 10/05/2021 and revealed severe multivessel coronary disease including diffuse LMCA disease of up to 70% with pressure dampening of 22F diagnostic catheter, 90% distal LCx stenosis, and sequential 70-80% ostial, 40% proximal, 80% mid, and 50% distal RCA lesions. Widespread heavy calcification of the coronary arteries is evident, upper normal left ventricular filling pressure (LVEDP 15 mmHg), mild to moderate aortic valve stenosis (peak-to-peak gradient 21 mmHg). He was seen by CT surgery for CABG consultation. On 10/25/21 he underwent CABG x 3 utilizing LIMA to LAD, SVG to PDA, and SVG to OM1 and aortic valve replacement with a 23 mm Edwards Resilia Valve, and endoscopic harvest of greater saphenous vein from right leg. Beta blockers were held due to issues with junctional rhythm alternating with complete heart block but returned to normal sinus rhythm by POD 3. He was discharged on 10/31/21.  Today, he is here alone for follow-up. Reports he is not feeling as well this week as he felt last week and attributes it not being outside due to the rain. Has been walking for a few minutes each day up and down the street in front of his home. Reports that he feels lightheaded and dizzy with position  changes as well as at times when he is standing or walking. Will sometimes feel like he is spinning. Has midsternal chest soreness. Pain in left shoulder that is worse with certain movements. He denies shortness of breath, lower extremity edema, palpitations, melena, hematuria, hemoptysis,  diaphoresis, presyncope, syncope, orthopnea, and PND.   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   Dysphagia    Esophageal dysmotility    GERD (gastroesophageal reflux disease)    Heart murmur    pt had recent echocardiogram   History of hiatal hernia    History of Holter monitoring 2009   History of kidney stones    HNP (herniated nucleus pulposus), lumbar    L4-5   HYPERCHOLESTEROLEMIA 11/30/2009   Hyperlipidemia    HYPERTENSION 11/30/2009   Kidney stones    Numbness and tingling    Peripheral vascular disease ([place])    Right carotid bruit    SMOKER 11/30/2009   Stroke ([place]) 2012   URINARY CALCULUS 11/30/2009    Past Surgical History:  Procedure Laterality Date   ABDOMINAL AORTOGRAM W/LOWER EXTREMITY N/A 06/18/2018   Procedure: ABDOMINAL AORTOGRAM W/LOWER EXTREMITY;  Surgeon: Marty Heck, MD;  Location: [facility];  Service: Cardiovascular;  Laterality: N/A;   ABDOMINAL AORTOGRAM W/LOWER EXTREMITY N/A 12/02/2019   Procedure: ABDOMINAL AORTOGRAM W/LOWER EXTREMITY;  Surgeon: Marty Heck, MD;  Location: [facility];  Service: Cardiovascular;  Laterality: N/A;   ABDOMINAL AORTOGRAM W/LOWER EXTREMITY N/A 03/23/2021   Procedure: ABDOMINAL AORTOGRAM W/LOWER EXTREMITY;  Surgeon: Marty Heck, MD;  Location: [facility];  Service: Cardiovascular;  Laterality: N/A;   AORTIC VALVE REPLACEMENT N/A 10/25/2021   Procedure: AORTIC VALVE REPLACEMENT (AVR)USING 23MM INSPIRIS RESILIA  AORTIC VALVE;  Surgeon: Lajuana Matte, MD;  Location: [facility];  Service: Open Heart Surgery;  Laterality: N/A;   arthroscopy of right shoulder Right    Jan 2019, June 2019-- done at [facility], Dr. Ninfa Linden   BACK SURGERY     CATARACT EXTRACTION W/ INTRAOCULAR  LENS  IMPLANT, BILATERAL     CORONARY ARTERY BYPASS GRAFT N/A 10/25/2021   Procedure: CORONARY ARTERY BYPASS GRAFTING (CABG) X 3 USING LEFT INTERNAL MAMMARY ARTERY AND RIGHT GREATER SAPHENOUS VEIN;  Surgeon: Lajuana Matte, MD;  Location: [facility];  Service: Open Heart Surgery;  Laterality: N/A;   CYSTOSCOPY     ENDARTERECTOMY Right 08/24/2019   Procedure: ENDARTERECTOMY CAROTID;  Surgeon: Marty Heck, MD;  Location: [facility];  Service: Vascular;  Laterality: Right;   ENDARTERECTOMY FEMORAL Left 06/05/2021   Procedure: ENDARTERECTOMY FEMORAL WITH PROFUNDAPLASTY;  Surgeon: Marty Heck, MD;  Location: [facility];  Service: Vascular;  Laterality: Left;   ENDOVEIN HARVEST OF GREATER SAPHENOUS VEIN Right 10/25/2021   Procedure: ENDOVEIN HARVEST OF GREATER SAPHENOUS VEIN;  Surgeon: Lajuana Matte, MD;  Location: [facility];  Service: Open Heart Surgery;  Laterality: Right;   FACIAL COSMETIC SURGERY  2014   FEMORAL-POPLITEAL BYPASS GRAFT Left 06/05/2021   Procedure: LEFT FEMORAL-POPLITEAL ARTERY BYPASS GRAFTING USING 58m PROPATEN VASCULAR REMOVABLE RING GRAFT;  Surgeon: CMarty Heck MD;  [place] [facility]  Service: Vascular;  Laterality: Left;   [place]  LEFT HEART  CATH AND CORONARY ANGIOGRAPHY N/A 10/05/2021   Procedure: LEFT HEART CATH AND CORONARY ANGIOGRAPHY;  Surgeon: Nelva Bush, MD;  Location: [facility];  Service: Cardiovascular;  Laterality: N/A;   LUMBAR LAMINECTOMY/DECOMPRESSION MICRODISCECTOMY Left 08/01/2018   Procedure: Microdiscectomy - Lumbar four-Lumbar five - left;  Surgeon: Eustace Moore, MD;  Location: [facility];  Service: Neurosurgery;  Laterality: Left;   LUMBAR LAMINECTOMY/DECOMPRESSION MICRODISCECTOMY Left 10/15/2018   Procedure: Re-do Microdiscectomy - left - Lumbar four-Lumbar five;  Surgeon: Eustace Moore, MD;  Location: [facility];  Service: Neurosurgery;  Laterality: Left;   NECK SURGERY  1986   PATCH ANGIOPLASTY Left 06/05/2021   Procedure:  PATCH ANGIOPLASTY USING Rueben Bash BIOLOGIC PATCH;  Surgeon: Marty Heck, MD;  Location: [facility];  Service: Vascular;  Laterality: Left;   PERIPHERAL VASCULAR ATHERECTOMY  12/02/2019   Procedure: PERIPHERAL VASCULAR ATHERECTOMY;  Surgeon: Marty Heck, MD;  Location: [facility];  Service: Cardiovascular;;  left SFA   PERIPHERAL VASCULAR INTERVENTION  06/18/2018   Procedure: PERIPHERAL VASCULAR INTERVENTION;  Surgeon: Marty Heck, MD;  Location: [facility];  Service: Cardiovascular;;  Left external iliac   ROTATOR CUFF REPAIR Left    TEE WITHOUT CARDIOVERSION N/A 10/25/2021   Procedure: TRANSESOPHAGEAL ECHOCARDIOGRAM (TEE);  Surgeon: Lajuana Matte, MD;  Location: [facility];  Service: Open Heart Surgery;  Laterality: N/A;   UMBILICAL HERNIA REPAIR      Current Medications: Current Meds  Medication Sig   amLODipine-benazepril (LOTREL) 5-20 MG capsule Take 1 capsule by mouth daily.   aspirin EC 81 MG EC tablet Take 1 tablet (81 mg total) by mouth daily at 6 (six) AM.   clopidogrel (PLAVIX) 75 MG tablet TAKE 1 TABLET BY MOUTH EVERY DAY   Continuous Blood Gluc Sensor (FREESTYLE LIBRE 2 SENSOR) MISC AS DIRECTED FOR BLOOD SUGAR CHECK DX E11.42   ezetimibe (ZETIA) 10 MG tablet Take 1 tablet (10 mg total) by mouth daily.   ferrous [id number] C-folic acid (TRINSICON / FOLTRIN) capsule Take 1 capsule by mouth 2 (two) times daily after a meal.   hydrochlorothiazide (HYDRODIURIL) 12.5 MG tablet Take 1 tablet (12.5 mg total) by mouth daily.   insulin degludec (TRESIBA) 200 UNIT/ML FlexTouch Pen Inject 34 Units into the skin in the morning.   metFORMIN (GLUCOPHAGE-XR) 500 MG 24 hr tablet Take 2 tablets (1,000 mg total) by mouth 2 (two) times daily.   ONETOUCH VERIO test strip 1 each 3 (three) times daily.   pantoprazole (PROTONIX) 40 MG tablet Take 40 mg by mouth daily with breakfast.    pioglitazone (ACTOS) 45 MG tablet TAKE 1 TABLET DAILY (NEED APPOINTMENT  FOR FURTHER REFILLS)   rosuvastatin (CRESTOR) 40 MG tablet Take 40 mg by mouth daily.    [DISCONTINUED] amLODipine-benazepril (LOTREL) 10-40 MG per capsule Take 1 capsule by mouth daily.     Allergies:   Codeine   Social History   Socioeconomic History   Marital status: Married    Spouse name: [name]    Number of children: 3   Years of education: 11   Highest education level: Not on file  Occupational History   Occupation: Truck Education administrator: [employer]  Tobacco Use   Smoking status: Former    Years: 40.00    Types: Cigarettes    Quit date: 12/2015    Years since quitting: 5.8   Smokeless tobacco: Never   Tobacco comments:    Quit 12-2012  Vaping Use  Vaping Use: Never used  Substance and Sexual Activity   Alcohol use: No   Drug use: No   Sexual activity: Not on file  Other Topics Concern   Not on file  Social History Narrative   . Patient drinks 2-3 cups of caffeine daily.    Patient lives at home with his wife [name].    Patient works at [employer].    Education. 12 th grade   Right handed               Social Determinants of Health   Financial Resource Strain: Not on file  Food Insecurity: No Food Insecurity (03/16/2020)   Hunger Vital Sign    Worried About Running Out of Food in the Last Year: Never true    Ran Out of Food in the Last Year: Never true  Transportation Needs: No Transportation Needs (03/16/2020)   PRAPARE - Hydrologist (Medical): No    Lack of Transportation (Non-Medical): No  Physical Activity: Not on file  Stress: Not on file  Social Connections: Not on file     Family History: The patient's family history includes CVA in his father; Coronary artery disease in his brother, brother, father, and mother; Diabetes in his mother and sister; Diabetes Mellitus I in his mother and sister; Heart disease in his brother, father, and mother; Stroke in his father.  ROS:   Please see the history of present illness.    +  lightheaded All other systems reviewed and are negative.  Labs/Other Studies Reviewed:    The following studies were reviewed today:  LHC 10/05/21  Severe multivessel coronary disease, including diffuse LMCA disease of up to 70% with pressure dampening of 2F diagnostic catheter, 90% distal LCx stenosis, and sequential 70-80% ostial, 40% proximal, 80% mid, and 50% distal RCA lesions.  Widespread heavy calcification of the coronary arteries is evident. Upper normal left ventricular filling pressure (LVEDP 15 mmHg). Mild-moderate aortic valve stenosis (peak-to-peak gradient 21 mmHg).   Recommendations: Outpatient cardiac surgery consultation for CABG. Aggressive secondary prevention of coronary artery disease. Continue dual antiplatelet therapy with aspirin and clopidogrel pending cardiac surgery evaluation and input from vascular surgery regarding interruption of clopidogrel in the setting of lower extremity interventions earlier this year  Coronary CT 09/27/21  1. Coronary artery calcium score 4204 Agatston units. This places the patient in the [place] percentile for age and gender.   2.  Aortic valve calcium score 1928 Agatston units.   3. Very heavy proximal-mid coronary calcification so difficult to quantify degree of stenosis. I suspect that there is severe ostial RCA stenosis. Proximal LAD, ramus, and LCx appear moderately stenosed. Possible severe mid LCx stenosis. Will send for FFR.   Echo 09/12/21   1. Mild to moderate AS (mean gradient 13 mmHg; AVA 1.5 cm2); gradient  slightly less on present study compared to 07/18/20.   2. Left ventricular ejection fraction, by estimation, is 60 to 65%. The  left ventricle has normal function. The left ventricle has no regional  wall motion abnormalities. Left ventricular diastolic parameters are  consistent with Grade I diastolic  dysfunction (impaired relaxation). The average left ventricular global  longitudinal strain is -20.9 %. The  global longitudinal strain is normal.   3. Right ventricular systolic function is normal. The right ventricular  size is normal. Tricuspid regurgitation signal is inadequate for assessing  PA pressure.   4. The mitral valve is normal in structure. Trivial mitral valve  regurgitation.  No evidence of mitral stenosis.   5. The aortic valve is tricuspid. Aortic valve regurgitation is not  visualized. Mild to moderate aortic valve stenosis.   6. The inferior vena cava is normal in size with greater than 50%  respiratory variability, suggesting right atrial pressure of 3 mmHg.   Recent Labs: 10/24/2021: ALT 16 10/26/2021: Magnesium 2.2 10/30/2021: BUN 12; Creatinine, Ser 1.00; Hemoglobin 8.0; Platelets 226; Potassium 3.8; Sodium 138  Recent Lipid Panel    Component Value Date/Time   CHOL 94 06/06/2021 0329   TRIG 59 06/06/2021 0329   HDL 39 (L) 06/06/2021 0329   CHOLHDL 2.4 06/06/2021 0329   VLDL 12 06/06/2021 0329   LDLCALC 43 06/06/2021 0329     Risk Assessment/Calculations:      Physical Exam:    VS:  BP (!) 108/58   Pulse 63   Ht '5\' 10"'$  (1.778 m)   Wt 158 lb 3.2 oz (71.8 kg)   SpO2 99%   BMI 22.70 kg/m     Wt Readings from Last 3 Encounters:  11/16/21 158 lb 3.2 oz (71.8 kg)  10/30/21 171 lb 4.8 oz (77.7 kg)  10/24/21 167 lb 9.6 oz (76 kg)     GEN: Well nourished, well developed in no acute distress HEENT: Normal NECK: No JVD; No carotid bruits CARDIAC: RRR, 2/6 systolic murmur. No rubs, gallops RESPIRATORY:  Clear to auscultation without rales, wheezing or rhonchi  ABDOMEN: Soft, non-tender, non-distended MUSCULOSKELETAL:  No edema; No deformity. 2+ pedal pulses, equal bilaterally SKIN: Warm and dry NEUROLOGIC:  Alert and oriented x 3 PSYCHIATRIC:  Normal affect   EKG:  EKG is ordered today.  The ekg ordered today demonstrates accelerated junctional rhythm at 63 bpm, reviewed with Drs. Johney Frame and Lovena Le. See assessment and plan below.  Diagnoses:    1.  Coronary artery disease involving native coronary artery of native heart without angina pectoris   2. Moderate aortic stenosis   3. S/P AVR (aortic valve replacement)   4. Hyperlipidemia LDL goal <70   5. Essential hypertension   6. Junctional cardiac arrhythmia    Assessment and Plan:     Junctional arrhythmia: EKG today reveals accelerated junctional at 63 bpm. Reviewed with Dr. Johney Frame and Dr. Lovena Le. Heis having some lightheadedness, particularly with position changes but no presyncope, syncope. Soft BP may be contributing. We will place a 7-day cardiac monitor and refer urgently to EP for consideration of pacemaker. Advised patient of ER precautions and no driving.  CAD s/p CABG: Has chest tenderness along the midsternal incision. He denies chest pain, dyspnea, or other symptoms concerning for angina. Has CXR and follow-up with [facility] on 7/13. Continue activity limitations until cleared by surgery.  Aortic stenosis s/p AVR: He denies chest pain, dyspnea, orthopnea, and PND. Has post surgical echo scheduled for 7/11. Continue to follow with Cardiac surgery as planned.   Anemia: CBC was 8.0 on 6/5 and he received a transfusion on 6/7. We will recheck CBC.   Hypertension: BP is soft today. He reports similar readings at home.  Notes lightheadedness, dizziness particularly when changing positions.  We will reduce his Lotrel to 5-20 mg daily. Continue HCTZ 12.5 mg daily.  As noted above advised him to seek immediate medical attention if lightheadedness worsens or he passes out.  Hyperlipidemia LDL goal < 70: LDL 43 on 06/06/2021.  Continue Zetia, rosuvastatin.     Cardiac Rehabilitation Eligibility Assessment  The patient is NOT ready to start cardiac rehabilitation due to: Other  Medication Adjustments/Labs and Tests Ordered: Current medicines are reviewed at length with the patient today.  Concerns regarding medicines are outlined above.  Orders Placed This Encounter   Procedures   Ambulatory referral to Cardiac Electrophysiology   LONG TERM MONITOR (3-14 DAYS)   EKG 12-Lead   Meds ordered this encounter  Medications   amLODipine-benazepril (LOTREL) 5-20 MG capsule    Sig: Take 1 capsule by mouth daily.    Dispense:  90 capsule    Refill:  3    Patient Instructions  Medication Instructions:   DECREASE Lotrel one (1) tablet by mouth (5-20 mg) daily.   *If you need a refill on your cardiac medications before your next appointment, please call your pharmacy*   Lab Work:  None ordered.  If you have labs (blood work) drawn today and your tests are completely normal, you will receive your results only by: [place] (if you have MyChart) OR A paper copy in the mail If you have any lab test that is abnormal or we need to change your treatment, we will call you to review the results.   Testing/Procedures:  [facility]- Long Term Monitor Instructions  Your physician has requested you wear a ZIO patch monitor for 7 days.  This is a single patch monitor. [employer] supplies one patch monitor per enrollment. Additional stickers are not available. Please do not apply patch if you will be having a Nuclear Stress Test,  Echocardiogram, Cardiac CT, MRI, or Chest Xray during the period you would be wearing the  monitor. The patch cannot be worn during these tests. You cannot remove and re-apply the  ZIO XT patch monitor.  Your ZIO patch monitor will be mailed 3 day USPS to your address on file. It may take 3-5 days  to receive your monitor after you have been enrolled.  Once you have received your monitor, please review the enclosed instructions. Your monitor  has already been registered assigning a specific monitor serial # to you.  Billing and Patient Assistance Program Information  We have supplied [employer] with any of your insurance information on file for billing purposes. [employer] offers a sliding scale Patient Assistance Program for patients  that do not have  insurance, or whose insurance does not completely cover the cost of the ZIO monitor.  You must apply for the Patient Assistance Program to qualify for this discounted rate.  To apply, please call [employer] at [phone], select option 4, select option 2, ask to apply for  Patient Assistance Program. Theodore Demark will ask your household income, and how many people  are in your household. They will quote your out-of-pocket cost based on that information.  [employer] will also be able to set up a 38-month interest-free payment plan if needed.  Applying the monitor   Shave hair from upper left chest.  Hold abrader disc by orange tab. Rub abrader in 40 strokes over the upper left chest as  indicated in your monitor instructions.  Clean area with 4 enclosed alcohol pads. Let dry.  Apply patch as indicated in monitor instructions. Patch will be placed under collarbone on left  side of chest with arrow pointing upward.  Rub patch adhesive wings for 2 minutes. Remove white label marked "1". Remove the white  label marked "2". Rub patch adhesive wings for 2 additional minutes.  While looking in a mirror, press and release button in center of patch. A small green light will  flash 3-4 times. This will be your only  indicator that the monitor has been turned on.  Do not shower for the first 24 hours. You may shower after the first 24 hours.  Press the button if you feel a symptom. You will hear a small click. Record Date, Time and  Symptom in the Patient Logbook.  When you are ready to remove the patch, follow instructions on the last 2 pages of Patient  Logbook. Stick patch monitor onto the last page of Patient Logbook.  Place Patient Logbook in the blue and white box. Use locking tab on box and tape box closed  securely. The blue and white box has prepaid postage on it. Please place it in the mailbox as  soon as possible. Your physician should have your test results approximately 7 days  after the  monitor has been mailed back to [facility].  Call [facility] at [phone] if you have questions regarding  your ZIO XT patch monitor. Call them immediately if you see an orange light blinking on your  monitor.  If your monitor falls off in less than 4 days, contact our Monitor department at [phone].  If your monitor becomes loose or falls off after 4 days call [employer] at [phone] for  suggestions on securing your monitor    Follow-Up: At [facility], you and your health needs are our priority.  As part of our continuing mission to provide you with exceptional heart care, we have created designated Provider Care Teams.  These Care Teams include your primary Cardiologist (physician) and Advanced Practice Providers (APPs -  Physician Assistants and Nurse Practitioners) who all work together to provide you with the care you need, when you need it.  We recommend signing up for the patient portal called "MyChart".  Sign up information is provided on this After Visit Summary.  MyChart is used to connect with patients for Virtual Visits (Telemedicine).  Patients are able to view lab/test results, encounter notes, upcoming appointments, etc.  Non-urgent messages can be sent to your provider as well.   To learn more about what you can do with MyChart, go to [email or web address].    Your next appointment:   2 week(s)  The format for your next appointment:   In Person  Provider:   Freada Bergeron, MD    Other instructions:  You have been referred to Dr. Curt Bears to discuss possible pacemaker. The office will call to set up appointment.    Important Information About Sugar         Signed, Emmaline Life, NP  11/16/2021 10:22 AM    [place]

## 2021-11-16 ENCOUNTER — Encounter: Payer: Self-pay | Admitting: Nurse Practitioner

## 2021-11-16 ENCOUNTER — Ambulatory Visit (INDEPENDENT_AMBULATORY_CARE_PROVIDER_SITE_OTHER): Payer: HMO

## 2021-11-16 ENCOUNTER — Ambulatory Visit (INDEPENDENT_AMBULATORY_CARE_PROVIDER_SITE_OTHER): Payer: HMO | Admitting: Nurse Practitioner

## 2021-11-16 ENCOUNTER — Telehealth: Payer: Self-pay | Admitting: *Deleted

## 2021-11-16 VITALS — BP 108/58 | HR 63 | Ht 70.0 in | Wt 158.2 lb

## 2021-11-16 DIAGNOSIS — I498 Other specified cardiac arrhythmias: Secondary | ICD-10-CM

## 2021-11-16 DIAGNOSIS — Z952 Presence of prosthetic heart valve: Secondary | ICD-10-CM

## 2021-11-16 DIAGNOSIS — I2510 Atherosclerotic heart disease of native coronary artery without angina pectoris: Secondary | ICD-10-CM

## 2021-11-16 DIAGNOSIS — E785 Hyperlipidemia, unspecified: Secondary | ICD-10-CM | POA: Diagnosis not present

## 2021-11-16 DIAGNOSIS — I350 Nonrheumatic aortic (valve) stenosis: Secondary | ICD-10-CM

## 2021-11-16 DIAGNOSIS — I10 Essential (primary) hypertension: Secondary | ICD-10-CM

## 2021-11-16 MED ORDER — AMLODIPINE BESY-BENAZEPRIL HCL 5-20 MG PO CAPS: 1.0000 | ORAL_CAPSULE | Freq: Every day | ORAL | 3 refills | Status: DC

## 2021-11-16 NOTE — Patient Instructions (Signed)
Medication Instructions:   DECREASE Lotrel one (1) tablet by mouth (5-20 mg) daily.   *If you need a refill on your cardiac medications before your next appointment, please call your pharmacy*   Lab Work:  None ordered.  If you have labs (blood work) drawn today and your tests are completely normal, you will receive your results only by: [place] (if you have MyChart) OR A paper copy in the mail If you have any lab test that is abnormal or we need to change your treatment, we will call you to review the results.   Testing/Procedures:  Bryn Gulling- Long Term Monitor Instructions  Your physician has requested you wear a ZIO patch monitor for 7 days.  This is a single patch monitor. [employer] supplies one patch monitor per enrollment. Additional stickers are not available. Please do not apply patch if you will be having a Nuclear Stress Test,  Echocardiogram, Cardiac CT, MRI, or Chest Xray during the period you would be wearing the  monitor. The patch cannot be worn during these tests. You cannot remove and re-apply the  ZIO XT patch monitor.  Your ZIO patch monitor will be mailed 3 day USPS to your address on file. It may take 3-5 days  to receive your monitor after you have been enrolled.  Once you have received your monitor, please review the enclosed instructions. Your monitor  has already been registered assigning a specific monitor serial # to you.  Billing and Patient Assistance Program Information  We have supplied [employer] with any of your insurance information on file for billing purposes. [employer] offers a sliding scale Patient Assistance Program for patients that do not have  insurance, or whose insurance does not completely cover the cost of the ZIO monitor.  You must apply for the Patient Assistance Program to qualify for this discounted rate.  To apply, please call [employer] at [phone], select option 4, select option 2, ask to apply for  Patient Assistance Program.  Theodore Demark will ask your household income, and how many people  are in your household. They will quote your out-of-pocket cost based on that information.  [employer] will also be able to set up a 27-month interest-free payment plan if needed.  Applying the monitor   Shave hair from upper left chest.  Hold abrader disc by orange tab. Rub abrader in 40 strokes over the upper left chest as  indicated in your monitor instructions.  Clean area with 4 enclosed alcohol pads. Let dry.  Apply patch as indicated in monitor instructions. Patch will be placed under collarbone on left  side of chest with arrow pointing upward.  Rub patch adhesive wings for 2 minutes. Remove white label marked "1". Remove the white  label marked "2". Rub patch adhesive wings for 2 additional minutes.  While looking in a mirror, press and release button in center of patch. A small green light will  flash 3-4 times. This will be your only indicator that the monitor has been turned on.  Do not shower for the first 24 hours. You may shower after the first 24 hours.  Press the button if you feel a symptom. You will hear a small click. Record Date, Time and  Symptom in the Patient Logbook.  When you are ready to remove the patch, follow instructions on the last 2 pages of Patient  Logbook. Stick patch monitor onto the last page of Patient Logbook.  Place Patient Logbook in the blue and white box. Use locking  tab on box and tape box closed  securely. The blue and white box has prepaid postage on it. Please place it in the mailbox as  soon as possible. Your physician should have your test results approximately 7 days after the  monitor has been mailed back to [facility].  Call [place] at [phone] if you have questions regarding  your ZIO XT patch monitor. Call them immediately if you see an orange light blinking on your  monitor.  If your monitor falls off in less than 4 days, contact our Monitor  department at [phone].  If your monitor becomes loose or falls off after 4 days call [employer] at [phone] for  suggestions on securing your monitor    Follow-Up: At [facility], you and your health needs are our priority.  As part of our continuing mission to provide you with exceptional heart care, we have created designated Provider Care Teams.  These Care Teams include your primary Cardiologist (physician) and Advanced Practice Providers (APPs -  Physician Assistants and Nurse Practitioners) who all work together to provide you with the care you need, when you need it.  We recommend signing up for the patient portal called "MyChart".  Sign up information is provided on this After Visit Summary.  MyChart is used to connect with patients for Virtual Visits (Telemedicine).  Patients are able to view lab/test results, encounter notes, upcoming appointments, etc.  Non-urgent messages can be sent to your provider as well.   To learn more about what you can do with MyChart, go to [email or web address].    Your next appointment:   2 week(s)  The format for your next appointment:   In Person  Provider:   Freada Bergeron, MD    Other instructions:  You have been referred to Dr. Curt Bears to discuss possible pacemaker. The office will call to set up appointment.    Important Information About Sugar

## 2021-11-16 NOTE — Progress Notes (Unsigned)
[id number] zio xt from office inventory applied to patient.  Dr. Johney Frame to read.

## 2021-11-17 ENCOUNTER — Other Ambulatory Visit: Payer: HMO

## 2021-11-17 DIAGNOSIS — I2510 Atherosclerotic heart disease of native coronary artery without angina pectoris: Secondary | ICD-10-CM

## 2021-11-17 DIAGNOSIS — Z952 Presence of prosthetic heart valve: Secondary | ICD-10-CM

## 2021-11-17 DIAGNOSIS — E785 Hyperlipidemia, unspecified: Secondary | ICD-10-CM

## 2021-11-17 DIAGNOSIS — I498 Other specified cardiac arrhythmias: Secondary | ICD-10-CM

## 2021-11-17 DIAGNOSIS — I350 Nonrheumatic aortic (valve) stenosis: Secondary | ICD-10-CM

## 2021-11-17 DIAGNOSIS — I10 Essential (primary) hypertension: Secondary | ICD-10-CM | POA: Diagnosis not present

## 2021-11-18 LAB — BASIC METABOLIC PANEL
BUN/Creatinine Ratio: 25 — ABNORMAL HIGH (ref 10–24)
BUN: 25 mg/dL (ref 8–27)
CO2: 23 mmol/L (ref 20–29)
Calcium: 9.6 mg/dL (ref 8.6–10.2)
Chloride: 96 mmol/L (ref 96–106)
Creatinine, Ser: 1.01 mg/dL (ref 0.76–1.27)
Glucose: 369 mg/dL — ABNORMAL HIGH (ref 70–99)
Potassium: 4.5 mmol/L (ref 3.5–5.2)
Sodium: 133 mmol/L — ABNORMAL LOW (ref 134–144)
eGFR: 80 mL/min/{1.73_m2} (ref >59)

## 2021-11-18 LAB — CBC
Hematocrit: 35.1 % — ABNORMAL LOW (ref 37.5–51.0)
Hemoglobin: 10.6 g/dL — ABNORMAL LOW (ref 13.0–17.7)
MCH: 27.2 pg (ref 26.6–33.0)
MCHC: 30.2 g/dL — ABNORMAL LOW (ref 31.5–35.7)
MCV: 90 fL (ref 79–97)
Platelets: 477 10*3/uL — ABNORMAL HIGH (ref 150–450)
RBC: 3.89 x10E6/uL — ABNORMAL LOW (ref 4.14–5.80)
RDW: 14.1 % (ref 11.6–15.4)
WBC: 6.2 10*3/uL (ref 3.4–10.8)

## 2021-11-19 NOTE — Progress Notes (Deleted)
Cardiology Office Note:    Date:  11/19/2021   ID:  Luis Lindsey, DOB Nov 09, 1950, MRN [id number]  PCP:  Aurea Graff.[name], MD  [facility] Cardiologist:  Freada Bergeron, MD  [place] Electrophysiologist:  None   Referring MD: Alroy Dust, Carlean Jews.[name], MD    History of Present Illness:    Luis Lindsey is a 71 y.o. male with a hx of PAD s/p L SFA atherectomy with angioplasty, carotid artery disease s/p L CEA, HTN, HLD, moderate AS, and tobacco use who returns to clinic for follow-up.  Patient was initially seen in 03/2020 after having episodes of lightheadedness in the setting of anemia, moderate AS, and relative hypotension. We stopped his HCTZ and decreased his metop to '25mg'$  XL daily. Symptoms resolved. TTE 08/2019:Normal EF. Moderate aortic valve stenosis. Aortic valve area, by VTI measures 1.21 cm. Aortic valve mean gradient measures 14.5 mmHg. Aortic valve Vmax measures 2.76 m/s.   Had abdominal aortogram in 02/2021 with Dr. Carlis Abbott where he was found to have ostial high grade SFA stenosis. Was recommended for bypass at that time. He subsequently underwent  left common femoral endarterectomy with profundoplasty and endarterectomy of the proximal SFA with bovine pericardial patch angioplasty and left common femoral to below-knee popliteal artery bypass on 06/05/21 without issues. Was discharged on ASA and plavix.   Seen in clinic on 06/29/20 where he was doing well. Had stable exertional dyspnea but no chest pain. Repeat TTE 06/2020 with EF 60-65%, G1DD AVA 1.33, mean gradient 19mHg, Vmax 2.84.   Seen by me on 08/28/21 where he had episode of substernal chest pressure radiating down his left arm. Coronary CTA was ordered which revealed coronary artery calcium score 4204, 98th percentile for age and gender.  Aortic valve calcium score 1928, very heavy proximal to mid coronary calcification so difficult to quantify degree of stenosis.  Suspicion of severe ostial RCA stenosis as well.  Proximal LAD, ramus, and LCx appear moderately stenosed.  Possible severe mid LCx stenosis with FFR that revealed multiple hemodynamically significant stenoses. He was advised to return to our office for discussion of cardiac catheterization.   Was seen on 10/03/2021 by VRobbie Lis PA with worsening chest pain concerning for angina. Cardiac catheterization was scheduled for 10/05/2021 and revealed severe multivessel coronary disease including diffuse LMCA disease of up to 70% with pressure dampening of 38F diagnostic catheter, 90% distal LCx stenosis, and sequential 70-80% ostial, 40% proximal, 80% mid, and 50% distal RCA lesions. Widespread heavy calcification of the coronary arteries is evident, upper normal left ventricular filling pressure (LVEDP 15 mmHg), mild to moderate aortic valve stenosis (peak-to-peak gradient 21 mmHg). He was seen by CT surgery for CABG consultation. On 10/25/21 he underwent CABG x 3 utilizing LIMA to LAD, SVG to PDA, and SVG to OM1 and aortic valve replacement with a 23 mm Edwards Resilia Valve, and endoscopic harvest of greater saphenous vein from right leg. Beta blockers were held due to issues with junctional rhythm alternating with complete heart block but returned to normal sinus rhythm by POD 3. He was discharged on 10/31/21.    Was last seen by MChristen Bame NP on 11/16/21 in follow-up. 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   Dysphagia    Esophageal dysmotility    GERD (gastroesophageal reflux disease)    Heart murmur    pt had recent echocardiogram   History of hiatal hernia    History of Holter monitoring 2009   History of kidney stones    HNP (herniated nucleus pulposus), lumbar    L4-5    HYPERCHOLESTEROLEMIA 11/30/2009   Hyperlipidemia    HYPERTENSION 11/30/2009   Kidney stones    Numbness and tingling    Peripheral vascular disease ([place])    Right carotid bruit    SMOKER 11/30/2009   Stroke ([place]) 2012   URINARY CALCULUS 11/30/2009    Past Surgical History:  Procedure Laterality Date   ABDOMINAL AORTOGRAM W/LOWER EXTREMITY N/A 06/18/2018   Procedure: ABDOMINAL AORTOGRAM W/LOWER EXTREMITY;  Surgeon: Marty Heck, MD;  Location: [place] [facility];  Service: Cardiovascular;  Laterality: N/A;   ABDOMINAL AORTOGRAM W/LOWER EXTREMITY N/A 12/02/2019   Procedure: ABDOMINAL AORTOGRAM W/LOWER EXTREMITY;  Surgeon: Marty Heck, MD;  Location: [place] [facility];  Service: Cardiovascular;  Laterality: N/A;   ABDOMINAL AORTOGRAM W/LOWER EXTREMITY N/A 03/23/2021   Procedure: ABDOMINAL AORTOGRAM W/LOWER EXTREMITY;  Surgeon: Marty Heck, MD;  Location: [place] [facility];  Service: Cardiovascular;  Laterality: N/A;   AORTIC VALVE REPLACEMENT N/A 10/25/2021   Procedure: AORTIC VALVE REPLACEMENT (AVR)USING 23MM INSPIRIS RESILIA  AORTIC VALVE;  Surgeon: Lajuana Matte, MD;  Location: [place];  Service: Open Heart Surgery;  Laterality: N/A;   arthroscopy of right shoulder Right    Jan 2019, June 2019-- done at [place], Dr. Ninfa Linden   BACK SURGERY     CATARACT EXTRACTION W/ INTRAOCULAR LENS  IMPLANT, BILATERAL     CORONARY ARTERY BYPASS GRAFT N/A 10/25/2021   Procedure: CORONARY ARTERY BYPASS GRAFTING (CABG) X 3 USING LEFT INTERNAL MAMMARY ARTERY AND RIGHT GREATER SAPHENOUS VEIN;  Surgeon: Lajuana Matte, MD;  Location: [place];  Service: Open Heart Surgery;  Laterality: N/A;   CYSTOSCOPY     ENDARTERECTOMY Right 08/24/2019   Procedure: ENDARTERECTOMY CAROTID;  Surgeon: Marty Heck, MD;  Location: [place];  Service: Vascular;  Laterality: Right;   ENDARTERECTOMY FEMORAL Left 06/05/2021   Procedure: ENDARTERECTOMY FEMORAL WITH  PROFUNDAPLASTY;  Surgeon: Marty Heck, MD;  Location: [place];  Service: Vascular;  Laterality: Left;   ENDOVEIN HARVEST OF GREATER SAPHENOUS VEIN Right 10/25/2021   Procedure: ENDOVEIN HARVEST OF GREATER SAPHENOUS VEIN;  Surgeon: Lajuana Matte, MD;  Location: [place];  Service: Open Heart Surgery;  Laterality: Right;   FACIAL COSMETIC SURGERY  2014   FEMORAL-POPLITEAL BYPASS GRAFT Left 06/05/2021   Procedure: LEFT FEMORAL-POPLITEAL ARTERY BYPASS GRAFTING USING 27m PROPATEN VASCULAR REMOVABLE RING GRAFT;  Surgeon: CMarty Heck MD;  Location[place]  Service: Vascular;  Laterality: Left;   FINGER SURGERY  2002   LEFT HEART CATH AND CORONARY ANGIOGRAPHY N/A 10/05/2021   Procedure: LEFT HEART CATH AND CORONARY ANGIOGRAPHY;  Surgeon: ENelva Bush MD;  [place];  Service: Cardiovascular;  Laterality: N/A;   LUMBAR LAMINECTOMY/DECOMPRESSION MICRODISCECTOMY Left 08/01/2018   Procedure: Microdiscectomy - Lumbar four-Lumbar five - left;  Surgeon: JEustace Moore MD;  [place]  Service: Neurosurgery;  Laterality: Left;   LUMBAR LAMINECTOMY/DECOMPRESSION MICRODISCECTOMY Left 10/15/2018   Procedure: Re-do Microdiscectomy - left - Lumbar four-Lumbar five;  Surgeon: JEustace Moore MD;  [place]  Service: Neurosurgery;  Laterality:  Left;   NECK SURGERY  1986   PATCH ANGIOPLASTY Left 06/05/2021   Procedure: PATCH ANGIOPLASTY USING Rueben Bash BIOLOGIC PATCH;  Surgeon: Marty Heck, MD;  Location: [place];  Service: Vascular;  Laterality: Left;   PERIPHERAL VASCULAR ATHERECTOMY  12/02/2019   Procedure: PERIPHERAL VASCULAR ATHERECTOMY;  Surgeon: Marty Heck, MD;  Location: [place] [facility];  Service: Cardiovascular;;  left SFA   PERIPHERAL VASCULAR INTERVENTION  06/18/2018   Procedure: PERIPHERAL VASCULAR INTERVENTION;  Surgeon: Marty Heck, MD;  Location: [place] [facility];  Service: Cardiovascular;;  Left external iliac   ROTATOR CUFF  REPAIR Left    TEE WITHOUT CARDIOVERSION N/A 10/25/2021   Procedure: TRANSESOPHAGEAL ECHOCARDIOGRAM (TEE);  Surgeon: Lajuana Matte, MD;  Location: [place];  Service: Open Heart Surgery;  Laterality: N/A;   UMBILICAL HERNIA REPAIR      Current Medications: No outpatient medications have been marked as taking for the 11/27/21 encounter (Appointment) with Freada Bergeron, MD.     Allergies:   Codeine   Social History   Socioeconomic History   Marital status: Married    Spouse name: [name]    Number of children: 3   Years of education: 11   Highest education level: Not on file  Occupational History   Occupation: Truck Education administrator: [employer]  Tobacco Use   Smoking status: Former    Years: 40.00    Types: Cigarettes    Quit date: 12/2015    Years since quitting: 5.9   Smokeless tobacco: Never   Tobacco comments:    Quit 12-2012  Vaping Use   Vaping Use: Never used  Substance and Sexual Activity   Alcohol use: No   Drug use: No   Sexual activity: Not on file  Other Topics Concern   Not on file  Social History Narrative   . Patient drinks 2-3 cups of caffeine daily.    Patient lives at home with his wife [name].    Patient works at [employer].    Education. 12 th grade   Right handed               Social Determinants of Health   Financial Resource Strain: Not on file  Food Insecurity: No Food Insecurity (03/16/2020)   Hunger Vital Sign    Worried About Running Out of Food in the Last Year: Never true    Ran Out of Food in the Last Year: Never true  Transportation Needs: No Transportation Needs (03/16/2020)   PRAPARE - Hydrologist (Medical): No    Lack of Transportation (Non-Medical): No  Physical Activity: Not on file  Stress: Not on file  Social Connections: Not on file     Family History: The patient's family history includes CVA in his father; Coronary artery disease in his brother, brother, father, and mother; Diabetes in  his mother and sister; Diabetes Mellitus I in his mother and sister; Heart disease in his brother, father, and mother; Stroke in his father.  ROS:   Please see the history of present illness.    Review of Systems  Constitutional:  Negative for chills and fever.  HENT:  Negative for nosebleeds.   Respiratory:  Positive for shortness of breath.   Cardiovascular:  Positive for chest pain. Negative for palpitations, orthopnea, claudication, leg swelling and PND.  Gastrointestinal:  Negative for nausea and vomiting.  Genitourinary:  Negative for hematuria.  Musculoskeletal:  Negative for falls.  Neurological:  Negative for dizziness and loss of consciousness.  Endo/Heme/Allergies:  Negative for polydipsia.  Psychiatric/Behavioral:  Negative for substance abuse.     EKGs/Labs/Other Studies Reviewed:    The following studies were reviewed today: [facility] 2021/11/04   Severe multivessel coronary disease, including diffuse LMCA disease of up to 70% with pressure dampening of 25F diagnostic catheter, 90% distal LCx stenosis, and sequential 70-80% ostial, 40% proximal, 80% mid, and 50% distal RCA lesions.  Widespread heavy calcification of the coronary arteries is evident. Upper normal left ventricular filling pressure (LVEDP 15 mmHg). Mild-moderate aortic valve stenosis (peak-to-peak gradient 21 mmHg).   Recommendations: Outpatient cardiac surgery consultation for CABG. Aggressive secondary prevention of coronary artery disease. Continue dual antiplatelet therapy with aspirin and clopidogrel pending cardiac surgery evaluation and input from vascular surgery regarding interruption of clopidogrel in the setting of lower extremity interventions earlier this year   Coronary CT 09/27/21   1. Coronary artery calcium score 4204 Agatston units. This places the patient in the [place] percentile for age and gender.   2.  Aortic valve calcium score 1928 Agatston units.   3. Very heavy proximal-mid coronary  calcification so difficult to quantify degree of stenosis. I suspect that there is severe ostial RCA stenosis. Proximal LAD, ramus, and LCx appear moderately stenosed. Possible severe mid LCx stenosis. Will send for FFR.   Echo 09/12/21    1. Mild to moderate AS (mean gradient 13 mmHg; AVA 1.5 cm2); gradient  slightly less on present study compared to 07/18/20.   2. Left ventricular ejection fraction, by estimation, is 60 to 65%. The  left ventricle has normal function. The left ventricle has no regional  wall motion abnormalities. Left ventricular diastolic parameters are  consistent with Grade I diastolic  dysfunction (impaired relaxation). The average left ventricular global  longitudinal strain is -20.9 %. The global longitudinal strain is normal.   3. Right ventricular systolic function is normal. The right ventricular  size is normal. Tricuspid regurgitation signal is inadequate for assessing  PA pressure.   4. The mitral valve is normal in structure. Trivial mitral valve  regurgitation. No evidence of mitral stenosis.   5. The aortic valve is tricuspid. Aortic valve regurgitation is not  visualized. Mild to moderate aortic valve stenosis.   6. The inferior vena cava is normal in size with greater than 50%  respiratory variability, suggesting right atrial pressure of 3 mmHg.     Recent Labs: 10/24/2021: ALT 16 10/26/2021: Magnesium 2.2 11/17/2021: BUN 25; Creatinine, Ser 1.01; Hemoglobin 10.6; Platelets 477; Potassium 4.5; Sodium 133  Recent Lipid Panel    Component Value Date/Time   CHOL 94 06/06/2021 0329   TRIG 59 06/06/2021 0329   HDL 39 (L) 06/06/2021 0329   CHOLHDL 2.4 06/06/2021 0329   VLDL 12 06/06/2021 0329   LDLCALC 43 06/06/2021 0329     Physical Exam:    VS:  There were no vitals taken for this visit.    Wt Readings from Last 3 Encounters:  11/16/21 158 lb 3.2 oz (71.8 kg)  10/30/21 171 lb 4.8 oz (77.7 kg)  10/24/21 167 lb 9.6 oz (76 kg)     GEN:  Well  nourished, well developed in no acute distress HEENT: Normal NECK: No JVD; no carotid bruits CARDIAC: RRR, 2/6 holosystolic, harsh murmur. No rubs, gallops RESPIRATORY:  Clear to auscultation without rales, wheezing or rhonchi  ABDOMEN: Soft, non-tender, non-distended MUSCULOSKELETAL:  No edema; No deformity  SKIN: Warm and dry NEUROLOGIC:  Alert and oriented x 3 PSYCHIATRIC:  Normal affect   ASSESSMENT:    No diagnosis found.  PLAN:    In order of problems listed above:  #Junctional Rhythm: Patient with accelerated junctional rhythm on ECG in clinic with episodes of dizziness. Discussed with Dr. Lovena Le and recommended for cardiac monitor. -Refer to EP  #Coronary Artery Disease s/p CABG x3: Cath 10/05/21 with severe multivessel coronary disease including diffuse LMCA disease of up to 70% with pressure dampening of 44F diagnostic catheter, 90% distal LCx stenosis, and sequential 70-80% ostial, 40% proximal, 80% mid, and 50% distal RCA lesions. Widespread heavy calcification of the coronary arteries is evident, upper normal left ventricular filling pressure (LVEDP 15 mmHg), mild to moderate aortic valve stenosis (peak-to-peak gradient 21 mmHg).He ultimately underwent CABGx3 on 10/25/21  utilizing LIMA to LAD, SVG to PDA, and SVG to OM1 and aortic valve replacement with a 23 mm Edwards Resilia Valve. Post-op course complicated by brief CHB that resolved by discharge. Currently, *** -Continue ASA '81mg'$  daily -Continue plavix '75mg'$  daily -Continue crestor '40mg'$  daily, zetia '10mg'$  daily -Not on BB due to post-op CHB and episode of junctional arrhythmia in clinic  #Moderate AS: S/p aortic valve replacement with a 23 mm Edwards Resilia Valve at time of CABG as detailed above.    #PAD: S/p left common femoral endarterectomy with bovine patch angioplasty and left common femoral to below the knee popliteal artery bypass with PTFE by Dr. Carlis Abbott on 06/05/2021. Claudication symptoms improved.  -Continue  ASA '81mg'$  daily and plavix '75mg'$  daily -Continue PPI -Continue rosuvastatin '40mg'$  daily   #Carotid artery stenosis s/p L CEA: -Continue rosuvastatin '40mg'$  daily -Continue ASA '81mg'$ , plavix '75mg'$  daily   #HTN: -Continue Amlo-benzapril 10-'40mg'$  daily -Off BB due to above   #HLD: Managed by PCP. Last LDL 71, HDL 43 -Rosuvastatin '40mg'$  daily   Medication Adjustments/Labs and Tests Ordered: Current medicines are reviewed at length with the patient today.  Concerns regarding medicines are outlined above.  No orders of the defined types were placed in this encounter.  No orders of the defined types were placed in this encounter.   There are no Patient Instructions on file for this visit.    Signed, Freada Bergeron, MD  11/19/2021 4:04 PM    [facility]

## 2021-11-20 ENCOUNTER — Other Ambulatory Visit: Payer: HMO

## 2021-11-23 ENCOUNTER — Other Ambulatory Visit: Payer: Self-pay | Admitting: Surgical

## 2021-11-24 ENCOUNTER — Other Ambulatory Visit: Payer: Self-pay | Admitting: Surgical

## 2021-11-27 ENCOUNTER — Encounter: Payer: Self-pay | Admitting: Cardiology

## 2021-11-27 ENCOUNTER — Ambulatory Visit (INDEPENDENT_AMBULATORY_CARE_PROVIDER_SITE_OTHER): Payer: HMO | Admitting: Cardiology

## 2021-11-27 VITALS — BP 108/64 | HR 73 | Ht 70.0 in | Wt 158.2 lb

## 2021-11-27 DIAGNOSIS — I498 Other specified cardiac arrhythmias: Secondary | ICD-10-CM

## 2021-11-27 DIAGNOSIS — Z952 Presence of prosthetic heart valve: Secondary | ICD-10-CM | POA: Diagnosis not present

## 2021-11-27 DIAGNOSIS — I25118 Atherosclerotic heart disease of native coronary artery with other forms of angina pectoris: Secondary | ICD-10-CM

## 2021-11-27 DIAGNOSIS — R809 Proteinuria, unspecified: Secondary | ICD-10-CM | POA: Diagnosis not present

## 2021-11-27 DIAGNOSIS — R7309 Other abnormal glucose: Secondary | ICD-10-CM

## 2021-11-27 DIAGNOSIS — I350 Nonrheumatic aortic (valve) stenosis: Secondary | ICD-10-CM

## 2021-11-27 DIAGNOSIS — I739 Peripheral vascular disease, unspecified: Secondary | ICD-10-CM

## 2021-11-27 DIAGNOSIS — I10 Essential (primary) hypertension: Secondary | ICD-10-CM

## 2021-11-27 DIAGNOSIS — E1165 Type 2 diabetes mellitus with hyperglycemia: Secondary | ICD-10-CM | POA: Diagnosis not present

## 2021-11-27 DIAGNOSIS — I6523 Occlusion and stenosis of bilateral carotid arteries: Secondary | ICD-10-CM

## 2021-11-27 DIAGNOSIS — R42 Dizziness and giddiness: Secondary | ICD-10-CM | POA: Diagnosis not present

## 2021-11-27 DIAGNOSIS — E119 Type 2 diabetes mellitus without complications: Secondary | ICD-10-CM

## 2021-11-27 MED ORDER — BENAZEPRIL HCL 20 MG PO TABS: 20.0000 mg | ORAL_TABLET | Freq: Every day | ORAL | 3 refills | Status: DC

## 2021-11-27 NOTE — Progress Notes (Signed)
Cardiology Office Note:    Date:  11/27/2021   ID:  Luis Lindsey, DOB 1951/05/03, MRN [id number]  PCP:  Aurea Graff.[name], MD  [facility] Cardiologist:  Freada Bergeron, MD  [place] Electrophysiologist:  None   Referring MD: Alroy Dust, Carlean Jews.[name], MD    History of Present Illness:    Luis Lindsey is a 71 y.o. male with a hx of PAD s/p L SFA atherectomy with angioplasty, carotid artery disease s/p L CEA, HTN, HLD, moderate AS, and tobacco use who returns to clinic for follow-up.  Patient was initially seen in 03/2020 after having episodes of lightheadedness in the setting of anemia, moderate AS, and relative hypotension. We stopped his HCTZ and decreased his metop to '25mg'$  XL daily. Symptoms resolved. TTE 08/2019:Normal EF. Moderate aortic valve stenosis. Aortic valve area, by VTI measures 1.21 cm. Aortic valve mean gradient measures 14.5 mmHg. Aortic valve Vmax measures 2.76 m/s.   Had abdominal aortogram in 02/2021 with Dr. Carlis Abbott where he was found to have ostial high grade SFA stenosis. Was recommended for bypass at that time. He subsequently underwent  left common femoral endarterectomy with profundoplasty and endarterectomy of the proximal SFA with bovine pericardial patch angioplasty and left common femoral to below-knee popliteal artery bypass on 06/05/21 without issues. Was discharged on ASA and plavix.   Seen in clinic on 06/29/20 where he was doing well. Had stable exertional dyspnea but no chest pain. Repeat TTE 06/2020 with EF 60-65%, G1DD AVA 1.33, mean gradient 23mHg, Vmax 2.84.   Seen by me on 08/28/21 where he had episode of substernal chest pressure radiating down his left arm. Coronary CTA was ordered which revealed coronary artery calcium score 4204, 98th percentile for age and gender.  Aortic valve calcium score 1928, very heavy proximal to mid coronary calcification so difficult to quantify degree of stenosis.  Suspicion of severe ostial RCA stenosis as well.  Proximal LAD, ramus, and LCx appear moderately stenosed.  Possible severe mid LCx stenosis with FFR that revealed multiple hemodynamically significant stenoses. He was advised to return to our office for discussion of cardiac catheterization.   Was seen on 10/03/2021 by VRobbie Lis PA with worsening chest pain concerning for angina. Cardiac catheterization was scheduled for 10/05/2021 and revealed severe multivessel coronary disease including diffuse LMCA disease of up to 70% with pressure dampening of 29F diagnostic catheter, 90% distal LCx stenosis, and sequential 70-80% ostial, 40% proximal, 80% mid, and 50% distal RCA lesions. Widespread heavy calcification of the coronary arteries is evident, upper normal left ventricular filling pressure (LVEDP 15 mmHg), mild to moderate aortic valve stenosis (peak-to-peak gradient 21 mmHg). He was seen by CT surgery for CABG consultation. On 10/25/21 he underwent CABG x 3 utilizing LIMA to LAD, SVG to PDA, and SVG to OM1 and aortic valve replacement with a 23 mm Edwards Resilia Valve, and endoscopic harvest of greater saphenous vein from right leg. Beta blockers were held due to issues with junctional rhythm alternating with complete heart block but returned to normal sinus rhythm by POD 3. He was discharged on 10/31/21.  Was last seen by MChristen Bame NP on 11/16/21 in follow-up. Was having dizziness with position changes and midsternal chest soreness. Was noted to be in a junctional rhythm with occasionally conducted sinus beats  Today, the patient states that he has been walking better and his balance has improved. However, he has been suffering from "a whole lot" of dizziness and fatigue. His dizziness occurs any time he  looks up or down, or bends over. He also notes palpitations and the sensation that his heart is "pounding" in his chest. This occurs intermittently and is associated with shortness of breath. No LOC or falls.   At home his systolic blood pressure has  been similar to today's in clinic reading (108), but his diastolic blood pressure has been lower on average (48-58).  Last night he experienced left "inside chest pain" with shooting pain radiating across his left chest. He would not describe this as a sharpness, but it is enough to catch his attention. Usually this occurs when lying down in the evening. No exertional pain.  Lately it has been difficult for him to control his diabetes. His blood sugar is frequently too low or too high. This is currently followed by his PCP; he does not see an endocrinologist.  Since his surgery he has lost about 10-15 lbs.  He denies any peripheral edema. No headaches, syncope, orthopnea, or PND.   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   Dysphagia    Esophageal dysmotility    GERD (gastroesophageal reflux disease)    Heart murmur    pt had recent echocardiogram   History of hiatal hernia    History of Holter monitoring 2009   History of kidney stones    HNP (herniated nucleus pulposus), lumbar    L4-5   HYPERCHOLESTEROLEMIA 11/30/2009   Hyperlipidemia    HYPERTENSION 11/30/2009   Kidney stones    Numbness and tingling    Peripheral vascular disease ([place])    Right carotid bruit    SMOKER 11/30/2009   Stroke ([place]) 2012   URINARY CALCULUS 11/30/2009    Past Surgical History:  Procedure Laterality Date   ABDOMINAL AORTOGRAM W/LOWER EXTREMITY N/A 06/18/2018   Procedure: ABDOMINAL AORTOGRAM W/LOWER EXTREMITY;  Surgeon: Marty Heck, MD;  Location: [place] [facility];  Service: Cardiovascular;  Laterality: N/A;   ABDOMINAL AORTOGRAM W/LOWER EXTREMITY N/A 12/02/2019   Procedure: ABDOMINAL AORTOGRAM W/LOWER EXTREMITY;  Surgeon: Marty Heck, MD;  Location: [place] [facility];  Service: Cardiovascular;   Laterality: N/A;   ABDOMINAL AORTOGRAM W/LOWER EXTREMITY N/A 03/23/2021   Procedure: ABDOMINAL AORTOGRAM W/LOWER EXTREMITY;  Surgeon: Marty Heck, MD;  Location: [place] CV [facility];  Service: Cardiovascular;  Laterality: N/A;   AORTIC VALVE REPLACEMENT N/A 10/25/2021   Procedure: AORTIC VALVE REPLACEMENT (AVR)USING 23MM INSPIRIS RESILIA  AORTIC VALVE;  Surgeon: Lajuana Matte, MD;  Location: [place];  Service: Open Heart Surgery;  Laterality: N/A;   arthroscopy of right shoulder Right    Jan 2019, June 2019-- done at [place], Dr. Ninfa Linden   BACK SURGERY     CATARACT EXTRACTION W/ INTRAOCULAR LENS  IMPLANT, BILATERAL     CORONARY ARTERY BYPASS GRAFT N/A 10/25/2021   Procedure: CORONARY ARTERY BYPASS GRAFTING (CABG) X 3 USING LEFT INTERNAL MAMMARY ARTERY AND RIGHT GREATER SAPHENOUS VEIN;  Surgeon: Lajuana Matte, MD;  Location: [place];  Service: Open Heart Surgery;  Laterality: N/A;   CYSTOSCOPY     ENDARTERECTOMY Right 08/24/2019   Procedure: ENDARTERECTOMY CAROTID;  Surgeon: Marty Heck, MD;  Location: [place];  Service: Vascular;  Laterality: Right;   ENDARTERECTOMY FEMORAL Left 06/05/2021  Procedure: ENDARTERECTOMY FEMORAL WITH PROFUNDAPLASTY;  Surgeon: Marty Heck, MD;  Location: [place];  Service: Vascular;  Laterality: Left;   ENDOVEIN HARVEST OF GREATER SAPHENOUS VEIN Right 10/25/2021   Procedure: ENDOVEIN HARVEST OF GREATER SAPHENOUS VEIN;  Surgeon: Lajuana Matte, MD;  Location: [place];  Service: Open Heart Surgery;  Laterality: Right;   FACIAL COSMETIC SURGERY  2014   FEMORAL-POPLITEAL BYPASS GRAFT Left 06/05/2021   Procedure: LEFT FEMORAL-POPLITEAL ARTERY BYPASS GRAFTING USING 28m PROPATEN VASCULAR REMOVABLE RING GRAFT;  Surgeon: CMarty Heck MD;  [place]  Service: Vascular;  Laterality: Left;   FINGER SURGERY  2002   LEFT HEART CATH AND CORONARY ANGIOGRAPHY N/A 10/05/2021   Procedure: LEFT HEART CATH AND CORONARY  ANGIOGRAPHY;  Surgeon: ENelva Bush MD;  [place] [facility];  Service: Cardiovascular;  Laterality: N/A;   LUMBAR LAMINECTOMY/DECOMPRESSION MICRODISCECTOMY Left 08/01/2018   Procedure: Microdiscectomy - Lumbar four-Lumbar five - left;  Surgeon: JEustace Moore MD;  [place]  Service: Neurosurgery;  Laterality: Left;   LUMBAR LAMINECTOMY/DECOMPRESSION MICRODISCECTOMY Left 10/15/2018   Procedure: Re-do Microdiscectomy - left - Lumbar four-Lumbar five;  Surgeon: JEustace Moore MD;  [place]  Service: Neurosurgery;  Laterality: Left;   NECK SURGERY  1986   PATCH ANGIOPLASTY Left 06/05/2021   Procedure: PATCH ANGIOPLASTY USING XRueben BashBIOLOGIC PATCH;  Surgeon: CMarty Heck MD;  [place]  Service: Vascular;  Laterality: Left;   PERIPHERAL VASCULAR ATHERECTOMY  12/02/2019   Procedure: PERIPHERAL VASCULAR ATHERECTOMY;  Surgeon: CMarty Heck MD;  [place];  Service: Cardiovascular;;  left SFA   PERIPHERAL VASCULAR INTERVENTION  06/18/2018   Procedure: PERIPHERAL VASCULAR INTERVENTION;  Surgeon: CMarty Heck MD;  [place] [facility];  Service: Cardiovascular;;  Left external iliac   ROTATOR CUFF REPAIR Left    TEE WITHOUT CARDIOVERSION N/A 10/25/2021   Procedure: TRANSESOPHAGEAL ECHOCARDIOGRAM (TEE);  Surgeon: LLajuana Matte MD;  [place]  Service: Open Heart Surgery;  Laterality: N/A;   UMBILICAL HERNIA REPAIR      Current Medications: Current Meds  Medication Sig   aspirin EC 81 MG EC tablet Take 1 tablet (81 mg total) by mouth daily at 6 (six) AM.   benazepril (LOTENSIN) 20 MG tablet Take 1 tablet (20 mg total) by mouth daily.   clopidogrel (PLAVIX) 75 MG tablet TAKE 1 TABLET BY MOUTH EVERY DAY   Continuous Blood Gluc Sensor (FREESTYLE LIBRE 2 SENSOR) MISC AS DIRECTED FOR BLOOD SUGAR CHECK DX E11.42   ezetimibe (ZETIA) 10 MG tablet Take 1 tablet (10 mg total) by mouth daily.   ferrous  [id number]-folic acid (TRINSICON / FOLTRIN) capsule Take 1 capsule by mouth 2 (two) times daily after a meal.   insulin degludec (TRESIBA) 200 UNIT/ML FlexTouch Pen Inject 34 Units into the skin in the morning.   metFORMIN (GLUCOPHAGE-XR) 500 MG 24 hr tablet Take 2 tablets (1,000 mg total) by mouth 2 (two) times daily.   ONETOUCH VERIO test strip 1 each 3 (three) times daily.   pantoprazole (PROTONIX) 40 MG tablet Take 40 mg by mouth daily with breakfast.    pioglitazone (ACTOS) 45 MG tablet TAKE 1 TABLET DAILY (NEED APPOINTMENT FOR FURTHER REFILLS)   rosuvastatin (CRESTOR) 40 MG tablet Take 40 mg by mouth daily.    [DISCONTINUED] amLODipine-benazepril (LOTREL) 5-20 MG capsule Take 1 capsule by mouth daily.   [DISCONTINUED] hydrochlorothiazide (HYDRODIURIL) 12.5 MG tablet Take 1 tablet (12.5  mg total) by mouth daily.     Allergies:   Codeine   Social History   Socioeconomic History   Marital status: Married    Spouse name: [name]    Number of children: 3   Years of education: 11   Highest education level: Not on file  Occupational History   Occupation: Truck Education administrator: [employer]  Tobacco Use   Smoking status: Former    Years: 40.00    Types: Cigarettes    Quit date: 12/2015    Years since quitting: 5.9   Smokeless tobacco: Never   Tobacco comments:    Quit 12-2012  Vaping Use   Vaping Use: Never used  Substance and Sexual Activity   Alcohol use: No   Drug use: No   Sexual activity: Not on file  Other Topics Concern   Not on file  Social History Narrative   . Patient drinks 2-3 cups of caffeine daily.    Patient lives at home with his wife [name].    Patient works at [employer].    Education. 12 th grade   Right handed               Social Determinants of Health   Financial Resource Strain: Not on file  Food Insecurity: No Food Insecurity (03/16/2020)   Hunger Vital Sign    Worried About Running Out of Food in the Last Year: Never true    Ran Out of  Food in the Last Year: Never true  Transportation Needs: No Transportation Needs (03/16/2020)   PRAPARE - Hydrologist (Medical): No    Lack of Transportation (Non-Medical): No  Physical Activity: Not on file  Stress: Not on file  Social Connections: Not on file     Family History: The patient's family history includes CVA in his father; Coronary artery disease in his brother, brother, father, and mother; Diabetes in his mother and sister; Diabetes Mellitus I in his mother and sister; Heart disease in his brother, father, and mother; Stroke in his father.  ROS:   Please see the history of present illness.    Review of Systems  Constitutional:  Positive for malaise/fatigue. Negative for chills and fever.  HENT:  Negative for nosebleeds.   Respiratory:  Positive for shortness of breath.   Cardiovascular:  Positive for chest pain (Left), palpitations (Nocturnal) and orthopnea. Negative for claudication, leg swelling and PND.  Gastrointestinal:  Negative for nausea and vomiting.  Genitourinary:  Negative for hematuria.  Musculoskeletal:  Negative for falls.  Neurological:  Positive for dizziness. Negative for loss of consciousness.  Endo/Heme/Allergies:  Negative for polydipsia.  Psychiatric/Behavioral:  Negative for substance abuse.     EKGs/Labs/Other Studies Reviewed:    The following studies were reviewed today:  LHC 10/05/21   Severe multivessel coronary disease, including diffuse LMCA disease of up to 70% with pressure dampening of 19F diagnostic catheter, 90% distal LCx stenosis, and sequential 70-80% ostial, 40% proximal, 80% mid, and 50% distal RCA lesions.  Widespread heavy calcification of the coronary arteries is evident. Upper normal left ventricular filling pressure (LVEDP 15 mmHg). Mild-moderate aortic valve stenosis (peak-to-peak gradient 21 mmHg).   Recommendations: Outpatient cardiac surgery consultation for CABG. Aggressive secondary  prevention of coronary artery disease. Continue dual antiplatelet therapy with aspirin and clopidogrel pending cardiac surgery evaluation and input from vascular surgery regarding interruption of clopidogrel in the setting of lower extremity interventions earlier this year   Coronary CT  09/27/21   1. Coronary artery calcium score 4204 Agatston units. This places the patient in the [place] percentile for age and gender.   2.  Aortic valve calcium score 1928 Agatston units.   3. Very heavy proximal-mid coronary calcification so difficult to quantify degree of stenosis. I suspect that there is severe ostial RCA stenosis. Proximal LAD, ramus, and LCx appear moderately stenosed. Possible severe mid LCx stenosis. Will send for FFR.   Echo 09/12/21    1. Mild to moderate AS (mean gradient 13 mmHg; AVA 1.5 cm2); gradient  slightly less on present study compared to 07/18/20.   2. Left ventricular ejection fraction, by estimation, is 60 to 65%. The  left ventricle has normal function. The left ventricle has no regional  wall motion abnormalities. Left ventricular diastolic parameters are  consistent with Grade I diastolic  dysfunction (impaired relaxation). The average left ventricular global  longitudinal strain is -20.9 %. The global longitudinal strain is normal.   3. Right ventricular systolic function is normal. The right ventricular  size is normal. Tricuspid regurgitation signal is inadequate for assessing  PA pressure.   4. The mitral valve is normal in structure. Trivial mitral valve  regurgitation. No evidence of mitral stenosis.   5. The aortic valve is tricuspid. Aortic valve regurgitation is not  visualized. Mild to moderate aortic valve stenosis.   6. The inferior vena cava is normal in size with greater than 50%  respiratory variability, suggesting right atrial pressure of 3 mmHg.    EKG:  EKG is personally reviewed. 11/27/2021:  Sinus rhythm. Rate 70 bpm. 11/16/2021 Christen Bame, NP):  accelerated junctional rhythm at 63 bpm, reviewed with Drs. Johney Frame and Lovena Le.    Recent Labs: 10/24/2021: ALT 16 10/26/2021: Magnesium 2.2 11/17/2021: BUN 25; Creatinine, Ser 1.01; Hemoglobin 10.6; Platelets 477; Potassium 4.5; Sodium 133   Recent Lipid Panel    Component Value Date/Time   CHOL 94 06/06/2021 0329   TRIG 59 06/06/2021 0329   HDL 39 (L) 06/06/2021 0329   CHOLHDL 2.4 06/06/2021 0329   VLDL 12 06/06/2021 0329   LDLCALC 43 06/06/2021 0329     Physical Exam:    VS:  BP 108/64   Pulse 73   Ht '5\' 10"'$  (1.778 m)   Wt 158 lb 3.2 oz (71.8 kg)   SpO2 98%   BMI 22.70 kg/m     Wt Readings from Last 3 Encounters:  11/27/21 158 lb 3.2 oz (71.8 kg)  11/16/21 158 lb 3.2 oz (71.8 kg)  10/30/21 171 lb 4.8 oz (77.7 kg)     GEN:  Well nourished, well developed in no acute distress HEENT: Normal NECK: No JVD; no carotid bruits CARDIAC: RRR, 2/6 systolic murmur. RESPIRATORY:  Clear to auscultation without rales, wheezing or rhonchi  ABDOMEN: Soft, non-tender, non-distended MUSCULOSKELETAL:  No edema; No deformity  SKIN: Warm and dry NEUROLOGIC:  Alert and oriented x 3 PSYCHIATRIC:  Normal affect   ASSESSMENT:    1. Junctional cardiac arrhythmia   2. Elevated hemoglobin A1c   3. Moderate aortic stenosis   4. S/P AVR (aortic valve replacement)   5. Essential hypertension   6. Bilateral carotid artery stenosis   7. PAD (peripheral artery disease) ([place])   8. Coronary artery disease of native artery of native heart with stable angina pectoris ([place])   9. Dizziness   10. Diabetes mellitus with coincident hypertension ([place])     PLAN:    In order of problems listed above:  #  Junctional Rhythm: Patient developed junctional rhythm alternating with complete heart block following AVR. This resolved but then looked like it recurred during visit with Christen Bame 11/16/21. Discussed with Dr. Lovena Le and planned for 7 day zio and EP referral. Elwyn Reach is pending  at this time. He is not on any nodal agents. Continues to feel dizzy and fatigued and I am concerned this may be contributing. Will refer to EP for further management -Refer to EP -Holding nodal agents  #Coronary Artery Disease s/p CABG x3: Cath 10/05/21 with severe multivessel coronary disease including diffuse LMCA disease of up to 70% with pressure dampening of 80F diagnostic catheter, 90% distal LCx stenosis, and sequential 70-80% ostial, 40% proximal, 80% mid, and 50% distal RCA lesions. Widespread heavy calcification of the coronary arteries is evident, upper normal left ventricular filling pressure (LVEDP 15 mmHg), mild to moderate aortic valve stenosis (peak-to-peak gradient 21 mmHg).He ultimately underwent CABGx3 on 10/25/21  utilizing LIMA to LAD, SVG to PDA, and SVG to OM1 and aortic valve replacement with a 23 mm Edwards Resilia Valve. Post-op course complicated by brief CHB that resolved by discharge. Currently, with fatigue and dizziness as detailed above but no anginal symptoms. Will continue medical therapy as below. -Continue ASA '81mg'$  daily -Continue plavix '75mg'$  daily (on for recent peripheral intervention) -Continue crestor '40mg'$  daily, zetia '10mg'$  daily -Not on BB due to post-op CHB and episode of junctional arrhythmia in clinic  #Moderate AS: S/p aortic valve replacement with a 23 mm Edwards Resilia Valve at time of CABG as detailed above.  -TTE scheduled on 12/05/21 -Follow-up with CV surgery as scheduled   #PAD: S/p left common femoral endarterectomy with bovine patch angioplasty and left common femoral to below the knee popliteal artery bypass with PTFE by Dr. Carlis Abbott on 06/05/2021. Claudication symptoms improved.  -Continue ASA '81mg'$  daily and plavix '75mg'$  daily -Continue PPI -Continue rosuvastatin '40mg'$  daily   #Carotid artery stenosis s/p L CEA: -Continue rosuvastatin '40mg'$  daily -Continue ASA '81mg'$ , plavix '75mg'$  daily   #HTN: #Concern for orthostasis: Patient with soft blood  pressures at home with suspicion for orthostatic symptoms which likely are contributing to fatigue and dizziness with position changes. Will stop HCTZ and amlodipine and monitor response.  -Stop HCTZ and amlodipine -Continue benzapril '20mg'$  daily -Monitor BP at home   #HLD: LDL 43, TG 59 in 05/2021. -Continue rosuvastatin '40mg'$  daily  #DMII: A1C 9.8 with fluctuating glucoses at home. -Refer to endocrinology  Follow-up:  3 months.   Medication Adjustments/Labs and Tests Ordered: Current medicines are reviewed at length with the patient today.  Concerns regarding medicines are outlined above.  Orders Placed This Encounter  Procedures   Ambulatory referral to Cardiac Electrophysiology   Ambulatory referral to Endocrinology   EKG 12-Lead   Meds ordered this encounter  Medications   benazepril (LOTENSIN) 20 MG tablet    Sig: Take 1 tablet (20 mg total) by mouth daily.    Dispense:  90 tablet    Refill:  3    Patient Instructions  Medication Instructions:  Your physician has recommended you make the following change in your medication: STOP: Lotrel STOP: Hydrochlorothiazide (HCTZ)  START: benazepril (Lotensin) 20 mg by mouth once daily  *If you need a refill on your cardiac medications before your next appointment, please call your pharmacy*   Lab Work: NONE If you have labs (blood work) drawn today and your tests are completely normal, you will receive your results only by: [place] (if you have MyChart) OR  A paper copy in the mail If you have any lab test that is abnormal or we need to change your treatment, we will call you to review the results.   Testing/Procedures: Your physician has referred you to see an Electrophysiologist (EP) Specialist. Dr. Curt Bears 12/04/21 at 8:15 am.   Your physician has referred you to see an Endocrinologist to manage your diabetes.     Follow-Up: At [facility], you and your health needs are our priority.  As part of our  continuing mission to provide you with exceptional heart care, we have created designated Provider Care Teams.  These Care Teams include your primary Cardiologist (physician) and Advanced Practice Providers (APPs -  Physician Assistants and Nurse Practitioners) who all work together to provide you with the care you need, when you need it.   Your next appointment:   3 month(s)  The format for your next appointment:   In Person  Provider:   Freada Bergeron, MD     Important Information About Sugar        I,Mathew Stumpf,acting as a scribe for Freada Bergeron, MD.,have documented all relevant documentation on the behalf of Freada Bergeron, MD,as directed by  Freada Bergeron, MD while in the presence of Freada Bergeron, MD.  I, Freada Bergeron, MD, have reviewed all documentation for this visit. The documentation on 11/27/21 for the exam, diagnosis, procedures, and orders are all accurate and complete.    Signed, Freada Bergeron, MD  11/27/2021 10:41 AM    [place]

## 2021-11-27 NOTE — Patient Instructions (Addendum)
Medication Instructions:  Your physician has recommended you make the following change in your medication: STOP: Lotrel STOP: Hydrochlorothiazide (HCTZ)  START: benazepril (Lotensin) 20 mg by mouth once daily  *If you need a refill on your cardiac medications before your next appointment, please call your pharmacy*   Lab Work: NONE If you have labs (blood work) drawn today and your tests are completely normal, you will receive your results only by: [place] (if you have MyChart) OR A paper copy in the mail If you have any lab test that is abnormal or we need to change your treatment, we will call you to review the results.   Testing/Procedures: Your physician has referred you to see an Electrophysiologist (EP) Specialist. Dr. Curt Bears 12/04/21 at 8:15 am.   Your physician has referred you to see an Endocrinologist to manage your diabetes.     Follow-Up: At [facility], you and your health needs are our priority.  As part of our continuing mission to provide you with exceptional heart care, we have created designated Provider Care Teams.  These Care Teams include your primary Cardiologist (physician) and Advanced Practice Providers (APPs -  Physician Assistants and Nurse Practitioners) who all work together to provide you with the care you need, when you need it.   Your next appointment:   3 month(s)  The format for your next appointment:   In Person  Provider:   Freada Bergeron, MD     Important Information About Sugar

## 2021-11-29 ENCOUNTER — Encounter: Payer: Self-pay | Admitting: Cardiology

## 2021-11-30 DIAGNOSIS — E785 Hyperlipidemia, unspecified: Secondary | ICD-10-CM | POA: Diagnosis not present

## 2021-11-30 DIAGNOSIS — I350 Nonrheumatic aortic (valve) stenosis: Secondary | ICD-10-CM | POA: Diagnosis not present

## 2021-11-30 DIAGNOSIS — Z952 Presence of prosthetic heart valve: Secondary | ICD-10-CM | POA: Diagnosis not present

## 2021-11-30 DIAGNOSIS — I2510 Atherosclerotic heart disease of native coronary artery without angina pectoris: Secondary | ICD-10-CM | POA: Diagnosis not present

## 2021-12-03 NOTE — Progress Notes (Unsigned)
Electrophysiology Office Note   Date:  12/03/2021   ID:  Luis Lindsey, DOB 20-Apr-1951, MRN [id number]  PCP:  Aurea Graff.[name], MD  Cardiologist:  Johney Frame Primary Electrophysiologist:  Meganne Rita Meredith Leeds, MD    Chief Complaint: bradycardia   History of Present Illness: Luis Lindsey is a 71 y.o. male who is being seen today for the evaluation of bradycardia at the request of Swinyer, Lanice Schwab, NP. Presenting today for electrophysiology evaluation.  He has a history significant for PAD status post left SFA atherectomy with angioplasty, carotid artery disease status post left CEA, hypertension, hyperlipidemia, moderate aortic stenosis, tobacco abuse.  He had a left heart catheterization 10/05/2021 that revealed severe multivessel coronary artery disease.  He is now status post CABG x3.  He had aortic valve replacement for moderate aortic stenosis.  He had episodes of junctional rhythm and complete heart block and thus beta-blockers were held.  He returned to sinus rhythm on postop day 3.  He returned to cardiology clinic complaining of dizziness.  He was found to be in junctional rhythm with occasional conducted sinus beats.  Today, he denies*** symptoms of palpitations, chest pain, shortness of breath, orthopnea, PND, lower extremity edema, claudication, dizziness, presyncope, syncope, bleeding, or neurologic sequela. The patient is tolerating medications without difficulties.    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   Dysphagia    Esophageal dysmotility    GERD (gastroesophageal reflux disease)    Heart murmur    pt had recent echocardiogram   History of hiatal hernia    History of Holter monitoring 2009   History of kidney stones    HNP (herniated nucleus pulposus), lumbar    L4-5    HYPERCHOLESTEROLEMIA 11/30/2009   Hyperlipidemia    HYPERTENSION 11/30/2009   Kidney stones    Numbness and tingling    Peripheral vascular disease ([place])    Right carotid bruit    SMOKER 11/30/2009   Stroke ([place]) 2012   URINARY CALCULUS 11/30/2009   Past Surgical History:  Procedure Laterality Date   ABDOMINAL AORTOGRAM W/LOWER EXTREMITY N/A 06/18/2018   Procedure: ABDOMINAL AORTOGRAM W/LOWER EXTREMITY;  Surgeon: Marty Heck, MD;  Location: [facility];  Service: Cardiovascular;  Laterality: N/A;   ABDOMINAL AORTOGRAM W/LOWER EXTREMITY N/A 12/02/2019   Procedure: ABDOMINAL AORTOGRAM W/LOWER EXTREMITY;  Surgeon: Marty Heck, MD;  Location: [facility];  Service: Cardiovascular;  Laterality: N/A;   ABDOMINAL AORTOGRAM W/LOWER EXTREMITY N/A 03/23/2021   Procedure: ABDOMINAL AORTOGRAM W/LOWER EXTREMITY;  Surgeon: Marty Heck, MD;  Location: [facility];  Service: Cardiovascular;  Laterality: N/A;   AORTIC VALVE REPLACEMENT N/A 10/25/2021   Procedure: AORTIC VALVE REPLACEMENT (AVR)USING 23MM INSPIRIS RESILIA  AORTIC VALVE;  Surgeon: Lajuana Matte, MD;  Location: [facility];  Service: Open Heart Surgery;  Laterality: N/A;   arthroscopy of right shoulder Right    Jan 2019, June 2019-- done at [facility], Dr. Ninfa Linden   BACK SURGERY     CATARACT EXTRACTION W/ INTRAOCULAR LENS  IMPLANT, BILATERAL     CORONARY ARTERY BYPASS GRAFT N/A 10/25/2021   Procedure: CORONARY ARTERY BYPASS GRAFTING (CABG) X 3 USING LEFT INTERNAL MAMMARY ARTERY AND RIGHT GREATER SAPHENOUS  VEIN;  Surgeon: Lajuana Matte, MD;  Location: [facility];  Service: Open Heart Surgery;  Laterality: N/A;   CYSTOSCOPY     ENDARTERECTOMY Right 08/24/2019   Procedure: ENDARTERECTOMY CAROTID;  Surgeon: Marty Heck, MD;  Location: [facility];  Service: Vascular;  Laterality: Right;   ENDARTERECTOMY FEMORAL Left 06/05/2021   Procedure: ENDARTERECTOMY FEMORAL WITH  PROFUNDAPLASTY;  Surgeon: Marty Heck, MD;  Location: [facility];  Service: Vascular;  Laterality: Left;   ENDOVEIN HARVEST OF GREATER SAPHENOUS VEIN Right 10/25/2021   Procedure: ENDOVEIN HARVEST OF GREATER SAPHENOUS VEIN;  Surgeon: Lajuana Matte, MD;  Location: [place] [facility];  Service: Open Heart Surgery;  Laterality: Right;   FACIAL COSMETIC SURGERY  2014   FEMORAL-POPLITEAL BYPASS GRAFT Left 06/05/2021   Procedure: LEFT FEMORAL-POPLITEAL ARTERY BYPASS GRAFTING USING 55m PROPATEN VASCULAR REMOVABLE RING GRAFT;  Surgeon: CMarty Heck MD;  [place] [facility]  Service: Vascular;  Laterality: Left;   FINGER SURGERY  2002   LEFT HEART CATH AND CORONARY ANGIOGRAPHY N/A 10/05/2021   Procedure: LEFT HEART CATH AND CORONARY ANGIOGRAPHY;  Surgeon: ENelva Bush MD;  [facility];  Service: Cardiovascular;  Laterality: N/A;   LUMBAR LAMINECTOMY/DECOMPRESSION MICRODISCECTOMY Left 08/01/2018   Procedure: Microdiscectomy - Lumbar four-Lumbar five - left;  Surgeon: JEustace Moore MD;  [place] [facility]  Service: Neurosurgery;  Laterality: Left;   LUMBAR LAMINECTOMY/DECOMPRESSION MICRODISCECTOMY Left 10/15/2018   Procedure: Re-do Microdiscectomy - left - Lumbar four-Lumbar five;  Surgeon: JEustace Moore MD;  [place] [facility]  Service: Neurosurgery;  Laterality: Left;   NECK SURGERY  1986   PATCH ANGIOPLASTY Left 06/05/2021   Procedure: PATCH ANGIOPLASTY USING XRueben BashBIOLOGIC PATCH;  Surgeon: CMarty Heck MD;  [place] [facility]  Service: Vascular;  Laterality: Left;   PERIPHERAL VASCULAR ATHERECTOMY  12/02/2019   Procedure: PERIPHERAL VASCULAR ATHERECTOMY;  Surgeon: CMarty Heck MD;  [facility];  Service: Cardiovascular;;  left SFA   PERIPHERAL VASCULAR INTERVENTION  06/18/2018   Procedure: PERIPHERAL VASCULAR INTERVENTION;  Surgeon: CMarty Heck MD;  [facility];  Service: Cardiovascular;;  Left external iliac   ROTATOR CUFF  REPAIR Left    TEE WITHOUT CARDIOVERSION N/A 10/25/2021   Procedure: TRANSESOPHAGEAL ECHOCARDIOGRAM (TEE);  Surgeon: LLajuana Matte MD;  [place] [facility]  Service: Open Heart Surgery;  Laterality: N/A;   UMBILICAL HERNIA REPAIR       Current Outpatient Medications  Medication Sig Dispense Refill   aspirin EC 81 MG EC tablet Take 1 tablet (81 mg total) by mouth daily at 6 (six) AM.     benazepril (LOTENSIN) 20 MG tablet Take 1 tablet (20 mg total) by mouth daily. 90 tablet 3   clopidogrel (PLAVIX) 75 MG tablet TAKE 1 TABLET BY MOUTH EVERY DAY 90 tablet 2   Continuous Blood Gluc Sensor (FREESTYLE LIBRE 2 SENSOR) MISC AS DIRECTED FOR BLOOD SUGAR CHECK DX E11.42     ezetimibe (ZETIA) 10 MG tablet Take 1 tablet (10 mg total) by mouth daily. 90 tablet 3   ferrous [id number]-H20-NOBSJGGC-folic acid (TRINSICON / FOLTRIN) capsule Take 1 capsule by mouth 2 (two) times daily after a meal. 60 capsule 1   insulin degludec (TRESIBA) 200 UNIT/ML FlexTouch Pen Inject 34 Units into the skin in the morning.     metFORMIN (GLUCOPHAGE-XR) 500 MG 24 hr tablet Take 2 tablets (1,000 mg total) by mouth 2 (two) times daily.     ONETOUCH VERIO  test strip 1 each 3 (three) times daily.     pantoprazole (PROTONIX) 40 MG tablet Take 40 mg by mouth daily with breakfast.      pioglitazone (ACTOS) 45 MG tablet TAKE 1 TABLET DAILY (NEED APPOINTMENT FOR FURTHER REFILLS) 90 tablet 0   rosuvastatin (CRESTOR) 40 MG tablet Take 40 mg by mouth daily.      No current facility-administered medications for this visit.    Allergies:   Codeine   Social History:  The patient  reports that he quit smoking about 5 years ago. His smoking use included cigarettes. He has never used smokeless tobacco. He reports that he does not drink alcohol and does not use drugs.   Family History:  The patient's family history includes CVA in his father; Coronary artery disease in his brother, brother, father, and mother; Diabetes in his mother  and sister; Diabetes Mellitus I in his mother and sister; Heart disease in his brother, father, and mother; Stroke in his father.    ROS:  Please see the history of present illness.   Otherwise, review of systems is positive for none.   All other systems are reviewed and negative.    PHYSICAL EXAM: VS:  There were no vitals taken for this visit. , BMI There is no height or weight on file to calculate BMI. GEN: Well nourished, well developed, in no acute distress  HEENT: normal  Neck: no JVD, carotid bruits, or masses Cardiac: ***RRR; no murmurs, rubs, or gallops,no edema  Respiratory:  clear to auscultation bilaterally, normal work of breathing GI: soft, nontender, nondistended, + BS MS: no deformity or atrophy  Skin: warm and dry Neuro:  Strength and sensation are intact Psych: euthymic mood, full affect  EKG:  EKG {ACTION; IS/IS [id number]} ordered today. Personal review of the ekg ordered *** shows ***  Recent Labs: 10/24/2021: ALT 16 10/26/2021: Magnesium 2.2 11/17/2021: BUN 25; Creatinine, Ser 1.01; Hemoglobin 10.6; Platelets 477; Potassium 4.5; Sodium 133    Lipid Panel     Component Value Date/Time   CHOL 94 06/06/2021 0329   TRIG 59 06/06/2021 0329   HDL 39 (L) 06/06/2021 0329   CHOLHDL 2.4 06/06/2021 0329   VLDL 12 06/06/2021 0329   LDLCALC 43 06/06/2021 0329     Wt Readings from Last 3 Encounters:  11/27/21 158 lb 3.2 oz (71.8 kg)  11/16/21 158 lb 3.2 oz (71.8 kg)  10/30/21 171 lb 4.8 oz (77.7 kg)      Other studies Reviewed: Additional studies/ records that were reviewed today include: TTE 09/12/21  Review of the above records today demonstrates:   1. Mild to moderate AS (mean gradient 13 mmHg; AVA 1.5 cm2); gradient  slightly less on present study compared to 07/18/20.   2. Left ventricular ejection fraction, by estimation, is 60 to 65%. The  left ventricle has normal function. The left ventricle has no regional  wall motion abnormalities. Left  ventricular diastolic parameters are  consistent with Grade I diastolic  dysfunction (impaired relaxation). The average left ventricular global  longitudinal strain is -20.9 %. The global longitudinal strain is normal.   3. Right ventricular systolic function is normal. The right ventricular  size is normal. Tricuspid regurgitation signal is inadequate for assessing  PA pressure.   4. The mitral valve is normal in structure. Trivial mitral valve  regurgitation. No evidence of mitral stenosis.   5. The aortic valve is tricuspid. Aortic valve regurgitation is not  visualized. Mild to moderate aortic valve  stenosis.   6. The inferior vena cava is normal in size with greater than 50%  respiratory variability, suggesting right atrial pressure of 3 mmHg.    ASSESSMENT AND PLAN:  1.  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  Unfortunately this has recurred.  Currently not on AV nodal blockers.  On his monitor, no further episodes of junctional bradycardia were seen.  2.  Coronary artery disease: Status post CABG x3.  Currently on aspirin, Plavix, Crestor.  No current chest pain.  Plan per primary cardiology.  3.  Moderate aortic stenosis: Status post valve replacement the time of CABG.  Plan per primary cardiology.  4.  Peripheral arterial disease: Status post left femoral endarterectomy with patch angioplasty.  Plan per vascular surgery and cardiology.    Current medicines are reviewed at length with the patient today.   The patient {ACTIONS; HAS/DOES NOT HAVE:19233} concerns regarding his medicines.  The following changes were made today:  {NONE DEFAULTED:18576}  Labs/ tests ordered today include: *** No orders of the defined types were placed in this encounter.    Disposition:   FU with Kimbley Sprague {gen number [id number]} {Days to years:10300}  Signed, Mitzi Lilja Meredith Leeds, MD  12/03/2021 2:37 PM     [place] [address] [phone] (office) [phone] (fax)

## 2021-12-04 ENCOUNTER — Ambulatory Visit (INDEPENDENT_AMBULATORY_CARE_PROVIDER_SITE_OTHER): Payer: HMO | Admitting: Cardiology

## 2021-12-04 ENCOUNTER — Encounter: Payer: Self-pay | Admitting: Cardiology

## 2021-12-04 VITALS — BP 150/72 | HR 71 | Ht 70.0 in | Wt 161.4 lb

## 2021-12-04 DIAGNOSIS — R001 Bradycardia, unspecified: Secondary | ICD-10-CM

## 2021-12-04 DIAGNOSIS — I498 Other specified cardiac arrhythmias: Secondary | ICD-10-CM

## 2021-12-04 NOTE — Patient Instructions (Signed)
Medication Instructions:  Your physician recommends that you continue on your current medications as directed. Please refer to the Current Medication list given to you today.  *If you need a refill on your cardiac medications before your next appointment, please call your pharmacy*   Lab Work: None ordered   Testing/Procedures: None ordered   Follow-Up: At [facility], you and your health needs are our priority.  As part of our continuing mission to provide you with exceptional heart care, we have created designated Provider Care Teams.  These Care Teams include your primary Cardiologist (physician) and Advanced Practice Providers (APPs -  Physician Assistants and Nurse Practitioners) who all work together to provide you with the care you need, when you need it.  Your next appointment:   As needed   The format for your next appointment:   In Person  Provider:   Will Camnitz, MD    Thank you for choosing [facility]!!   Criss Pallone, RN [phone]  Other Instructions   Important Information About Sugar           

## 2021-12-05 ENCOUNTER — Telehealth: Payer: Self-pay | Admitting: *Deleted

## 2021-12-05 ENCOUNTER — Ambulatory Visit (HOSPITAL_COMMUNITY): Payer: HMO | Attending: Cardiovascular Disease

## 2021-12-05 DIAGNOSIS — I77819 Aortic ectasia, unspecified site: Secondary | ICD-10-CM

## 2021-12-05 DIAGNOSIS — Z952 Presence of prosthetic heart valve: Secondary | ICD-10-CM | POA: Insufficient documentation

## 2021-12-05 LAB — ECHOCARDIOGRAM COMPLETE
AV Mean grad: 10 mmHg
AV Peak grad: 18 mmHg
Ao pk vel: 2.12 m/s
Area-P 1/2: 3.99 cm2
S' Lateral: 2.8 cm

## 2021-12-05 NOTE — Telephone Encounter (Signed)
-----   Message from Freada Bergeron, MD sent at 12/05/2021  5:22 PM EDT ----- His echo looks great with normal pumping function and a normal functioning aortic valve replacement. His aorta is mildly dilated which we will monitor with yearly echoes going forward.

## 2021-12-05 NOTE — Telephone Encounter (Signed)
The patient has been notified of the result and verbalized understanding.  All questions (if any) were answered.  Pt aware I will go ahead and place the order for repeat echo in one year in the system and send a message to our [facility] Schedulers to call him back and arrange this appt closer to that time. Pt verbalized understanding and agrees with this plan.

## 2021-12-06 ENCOUNTER — Other Ambulatory Visit: Payer: Self-pay | Admitting: Thoracic Surgery (Cardiothoracic Vascular Surgery)

## 2021-12-06 DIAGNOSIS — Z951 Presence of aortocoronary bypass graft: Secondary | ICD-10-CM

## 2021-12-07 ENCOUNTER — Ambulatory Visit
Admission: RE | Admit: 2021-12-07 | Discharge: 2021-12-07 | Disposition: A | Discharge status: Home / Self Care | Payer: PPO | Source: Ambulatory Visit | Attending: Thoracic Surgery (Cardiothoracic Vascular Surgery) | Admitting: Thoracic Surgery (Cardiothoracic Vascular Surgery)

## 2021-12-07 ENCOUNTER — Ambulatory Visit (INDEPENDENT_AMBULATORY_CARE_PROVIDER_SITE_OTHER): Payer: Self-pay | Admitting: Physician Assistant

## 2021-12-07 VITALS — BP 128/68 | HR 78 | Resp 20 | Ht 70.0 in | Wt 161.0 lb

## 2021-12-07 DIAGNOSIS — Z951 Presence of aortocoronary bypass graft: Secondary | ICD-10-CM

## 2021-12-07 DIAGNOSIS — Z952 Presence of prosthetic heart valve: Secondary | ICD-10-CM | POA: Diagnosis not present

## 2021-12-07 DIAGNOSIS — I350 Nonrheumatic aortic (valve) stenosis: Secondary | ICD-10-CM

## 2021-12-07 DIAGNOSIS — I2510 Atherosclerotic heart disease of native coronary artery without angina pectoris: Secondary | ICD-10-CM

## 2021-12-07 NOTE — Patient Instructions (Signed)
You are encouraged to enroll and participate in the outpatient cardiac rehab program beginning as soon as practical.   You may return to driving an automobile as long as you are no longer requiring oral narcotic pain relievers during the daytime.  It would be wise to start driving only short distances during the daylight and gradually increase from there as you feel comfortable.   Make every effort to maintain a "heart-healthy" lifestyle with regular physical exercise and adherence to a low-fat, low-carbohydrate diet.  Continue to seek regular follow-up appointments with your primary care physician and/or cardiologist.   Endocarditis is a potentially serious infection of heart valves or inside lining of the heart.  It occurs more commonly in patients with diseased heart valves (such as patient's with aortic or mitral valve disease) and in patients who have undergone heart valve repair or replacement.  Certain surgical and dental procedures may put you at risk, such as dental cleaning, other dental procedures, or any surgery involving the respiratory, urinary, gastrointestinal tract, gallbladder or prostate gland.   To minimize your chances for develooping endocarditis, maintain good oral health and seek prompt medical attention for any infections involving the mouth, teeth, gums, skin or urinary tract.    Always notify your doctor or dentist about your underlying heart valve condition before having any invasive procedures. You will need to take antibiotics before certain procedures, including all routine dental cleanings or other dental procedures.  Your cardiologist or dentist should prescribe these antibiotics for you to be taken ahead of time.   You may continue to gradually increase your physical activity as tolerated.  Refrain from any heavy lifting or strenuous use of your arms and shoulders until at least 8 weeks from the time of your surgery, and avoid activities that cause increased pain  in your chest on the side of your surgical incision.  Otherwise you may continue to increase activities without any particular limitations.  Increase the intensity and duration of physical activity gradually.

## 2021-12-07 NOTE — Progress Notes (Signed)
[address]       [place] [address]             [phone]    HPI: Patient returns for routine postoperative follow-up having undergone CABG x 3/AVR on 10/17/2021. The patient's early postoperative recovery while in the hospital was notable for junctional rhythm with complete heart block.  This resolved back to NSR. Since hospital discharge the patient reports he still has not gotten all of his energy back.  He states that he is finally starting to put back on some weight that he had lost.  He is not very active only walking 3x per week.  He is hoping to start driving soon.  His incisions have been healing w/o difficulty.  He is interested in cardiac rehabilitation.   Current Outpatient Medications  Medication Sig Dispense Refill   aspirin EC 81 MG EC tablet Take 1 tablet (81 mg total) by mouth daily at 6 (six) AM.     benazepril (LOTENSIN) 20 MG tablet Take 1 tablet (20 mg total) by mouth daily. 90 tablet 3   clopidogrel (PLAVIX) 75 MG tablet TAKE 1 TABLET BY MOUTH EVERY DAY 90 tablet 2   Continuous Blood Gluc Sensor (FREESTYLE LIBRE 2 SENSOR) MISC AS DIRECTED FOR BLOOD SUGAR CHECK DX E11.42     ezetimibe (ZETIA) 10 MG tablet Take 1 tablet (10 mg total) by mouth daily. 90 tablet 3   ferrous [id number] C-folic acid (TRINSICON / FOLTRIN) capsule Take 1 capsule by mouth 2 (two) times daily after a meal. 60 capsule 1   insulin degludec (TRESIBA) 200 UNIT/ML FlexTouch Pen Inject 34 Units into the skin in the morning.     metFORMIN (GLUCOPHAGE-XR) 500 MG 24 hr tablet Take 2 tablets (1,000 mg total) by mouth 2 (two) times daily.     ONETOUCH VERIO test strip 1 each 3 (three) times daily.     pantoprazole (PROTONIX) 40 MG tablet Take 40 mg by mouth daily with breakfast.      pioglitazone (ACTOS) 45 MG tablet TAKE 1 TABLET DAILY (NEED APPOINTMENT FOR FURTHER REFILLS) 90 tablet 0   rosuvastatin (CRESTOR) 40 MG tablet Take 40 mg by mouth daily.      No current  facility-administered medications for this visit.    Physical Exam:  BP 128/68 (BP Location: Right Arm, Patient Position: Sitting, Cuff Size: Normal)   Pulse 78   Resp 20   Ht '5\' 10"'$  (1.778 m)   Wt 161 lb (73 kg)   SpO2 100% Comment: RA  BMI 23.10 kg/m   Gen: NAD Heart: RRR Lungs: CTA bilaterally Ext: no edema Incisions: well healed  Diagnostic Tests:  CXR: trace left pleural effusion, sternal wires intact, no pneumothorax  ECHO:   IMPRESSIONS    1. Compared with echo 08/2021, there is now a well-functioning  bioprosthetic valve in the aortic position.   2. Left ventricular ejection fraction, by estimation, is 55 to 60%. The  left ventricle has normal function. The left ventricle has no regional  wall motion abnormalities. There is mild concentric left ventricular  hypertrophy. Left ventricular diastolic  parameters were normal. The average left ventricular global longitudinal  strain is -23.0 %. The global longitudinal strain is normal.   3. Right ventricular systolic function is normal. The right ventricular  size is normal. There is normal pulmonary artery systolic pressure.   4. Left atrial size was mildly dilated.   5. The mitral valve is normal in  structure. Trivial mitral valve  regurgitation. No evidence of mitral stenosis.   6. The aortic valve has been repaired/replaced. Aortic valve  regurgitation is not visualized. No aortic stenosis is present. There is a  23 mm Inpsiris Resilia valve present in the aortic position. Procedure  Date: 10/25/21. Echo findings are consistent  with normal structure and function of the aortic valve prosthesis. Aortic  valve mean gradient measures 10.0 mmHg. Aortic valve Vmax measures 2.12  m/s.   7. Aortic dilatation noted. There is mild dilatation of the ascending  aorta, measuring 38 mm.   8. The inferior vena cava is normal in size with greater than 50%  respiratory variability, suggesting right atrial pressure of 3 mmHg.    A/P:  S/P CABG x 3/AVR Decreased energy level- patient states its slowly improving, I explained to patient that full recovery can take up to 3 months.  However, I also instructed him that he is not walking enough, and that ideally he should be ambulating at least 3x per day. Cardiac rehabilitation- you are cleared to enroll if you wish to participate Endocarditis education discussed Activity- increase ambulation as tolerated, okay to resume driving if you are no longer taking narcotic pain medication, continue sternal precautions as discussed RTC prn   Ellwood Handler, PA-C [facility] [phone]

## 2021-12-19 DIAGNOSIS — I10 Essential (primary) hypertension: Secondary | ICD-10-CM | POA: Diagnosis not present

## 2021-12-19 DIAGNOSIS — Z89011 Acquired absence of right thumb: Secondary | ICD-10-CM | POA: Diagnosis not present

## 2021-12-19 DIAGNOSIS — Z87891 Personal history of nicotine dependence: Secondary | ICD-10-CM | POA: Diagnosis not present

## 2021-12-19 DIAGNOSIS — D692 Other nonthrombocytopenic purpura: Secondary | ICD-10-CM | POA: Diagnosis not present

## 2021-12-19 DIAGNOSIS — E1165 Type 2 diabetes mellitus with hyperglycemia: Secondary | ICD-10-CM | POA: Diagnosis not present

## 2021-12-19 DIAGNOSIS — Z89021 Acquired absence of right finger(s): Secondary | ICD-10-CM | POA: Diagnosis not present

## 2022-01-02 ENCOUNTER — Institutional Professional Consult (permissible substitution): Payer: HMO | Admitting: Cardiology

## 2022-01-31 IMAGING — MR MR LUMBAR SPINE WO/W CM
4 of 7 series · 23 of 48 positions shown · IV contrast (multihance)
Comparison: [HOSPITAL] neurosurgery lumbar MRI 10/01/2018. Lumbar
radiographs 02/17/2018.

CLINICAL DATA: 68-year-old male with prior surgery. Low back pain
radiating to the left buttock and leg with numbness since [REDACTED].

Creatinine was obtained on site at [HOSPITAL] at [HOSPITAL].
Results: Creatinine 0.7 mg/dL.
EXAM:
MRI LUMBAR SPINE WITHOUT AND WITH CONTRAST
TECHNIQUE: Multiplanar and multiecho pulse sequences of the lumbar spine were
obtained without and with intravenous contrast.
CONTRAST:  15mL MULTIHANCE GADOBENATE DIMEGLUMINE 529 MG/ML IV SOLN

[Series 3: T1 · sagittal · 4.0mm · 0.55mm/px · 5 of 15 slices shown (1 of 2)]
[im 1/15]
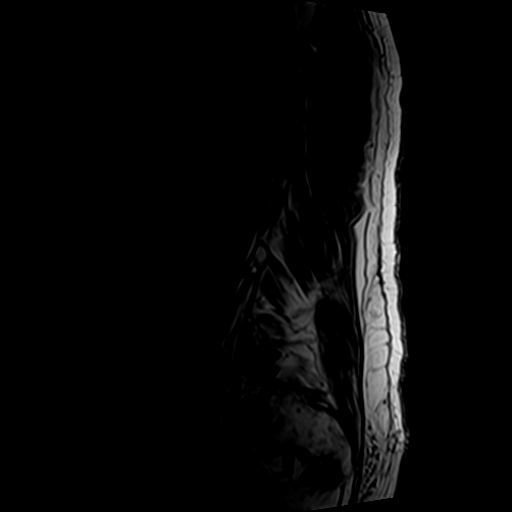
[im 4/15]
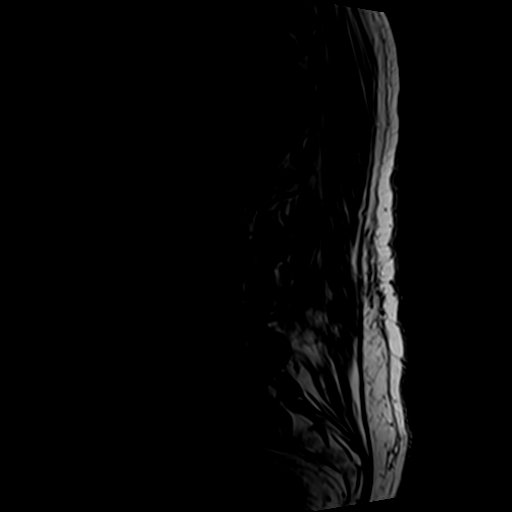
[im 8/15]
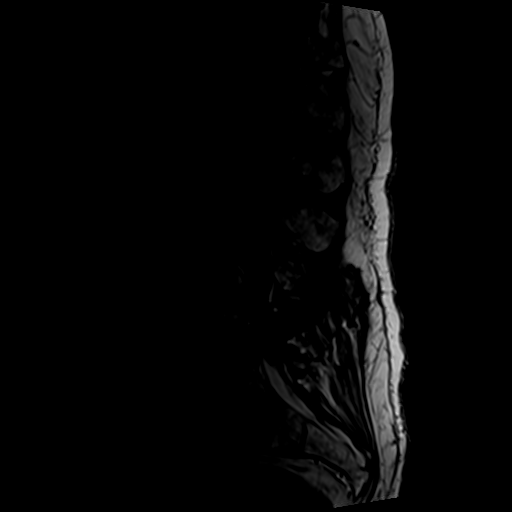
[im 11/15]
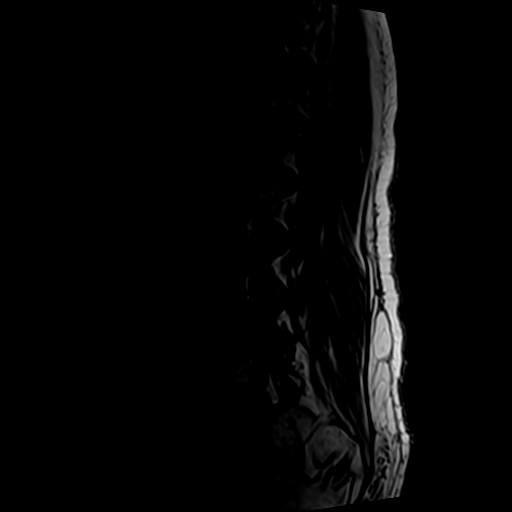
[im 15/15]
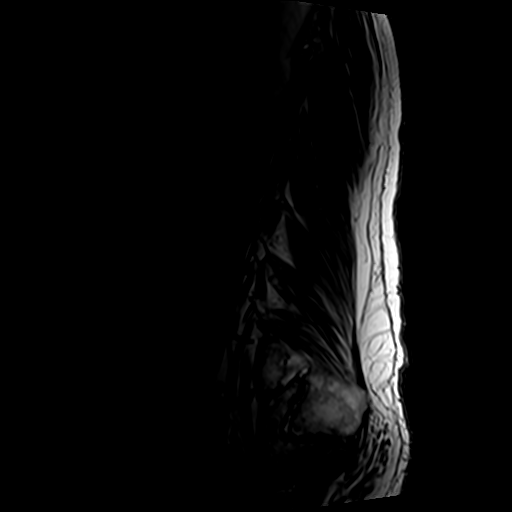

[Series 5: T2 · axial · 4.0mm · 0.70mm/px · z∈[-99,+121]mm · 8 of 37 slices shown (1 of 2)]
[im 1/37]
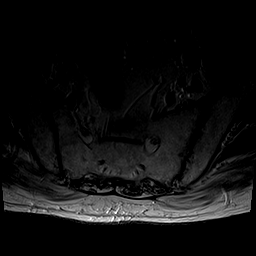
[im 5/37]
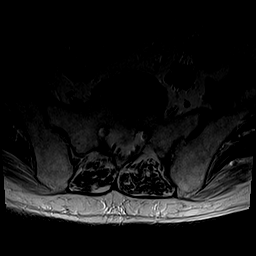
[im 13/37]
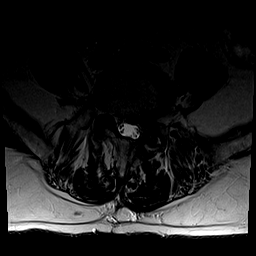
[im 17/37]
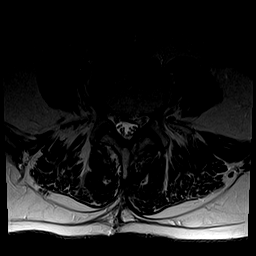
[im 21/37]
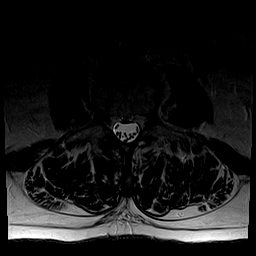
[im 25/37]
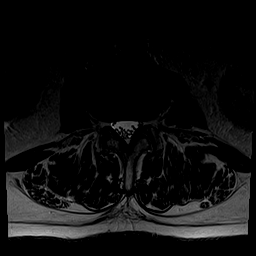
[im 33/37]
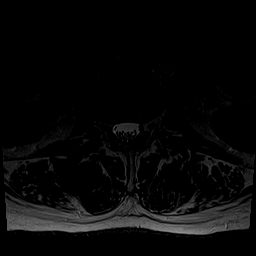
[im 37/37]
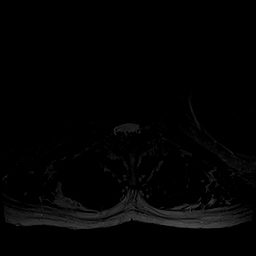

[Series 6: T1 · axial · 4.0mm · 0.35mm/px · z∈[-99,+87]mm · 6 of 37 slices shown (2 of 2)]
[im 1/37]
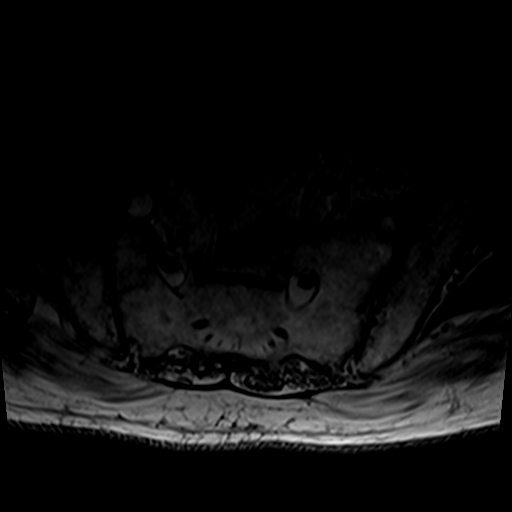
[im 5/37]
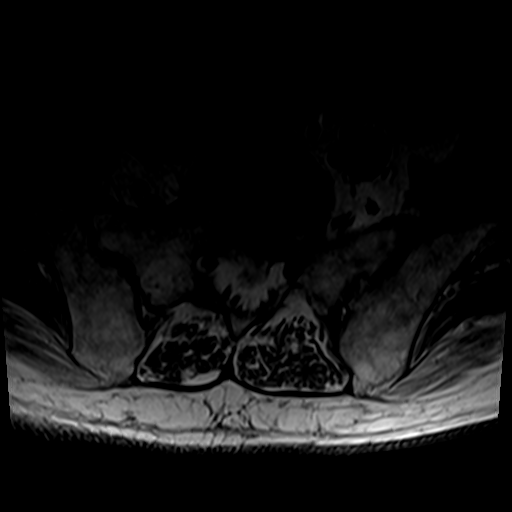
[im 13/37]
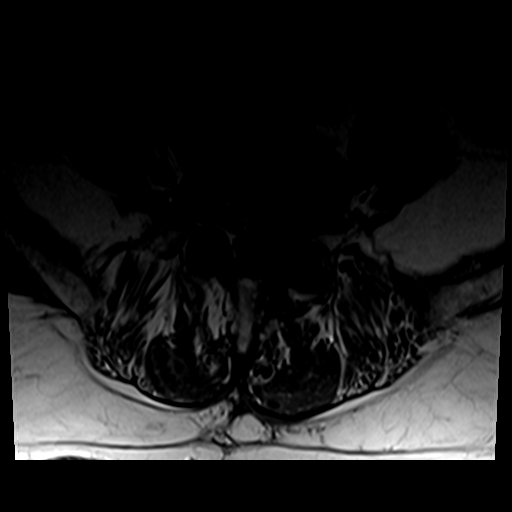
[im 17/37]
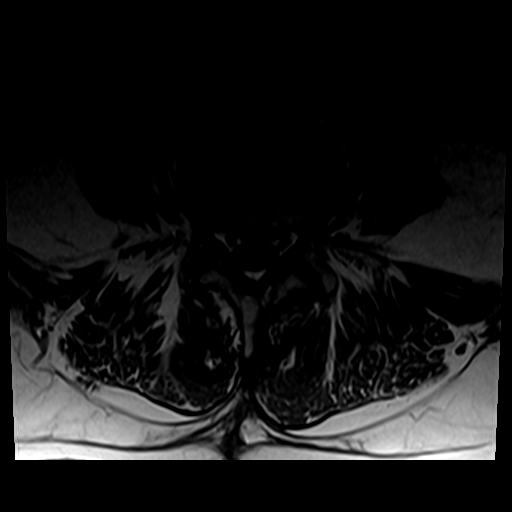
[im 21/37]
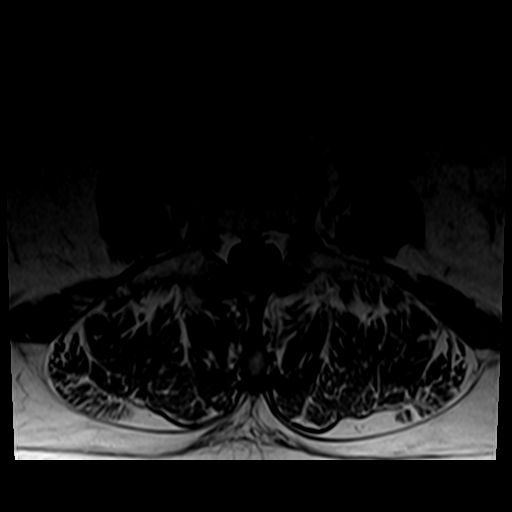
[im 33/37]
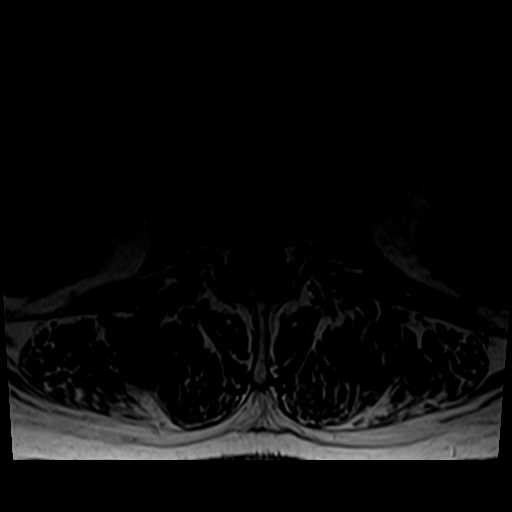

[Series 7: T2 · sagittal · 4.0mm · 0.55mm/px · 4 of 15 slices shown (2 of 2)]
[im 1/15]
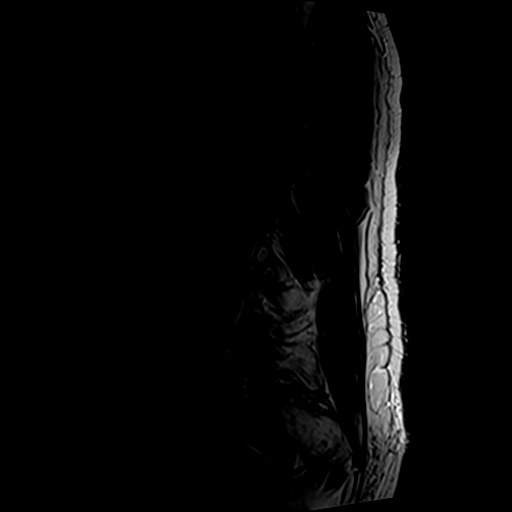
[im 5/15]
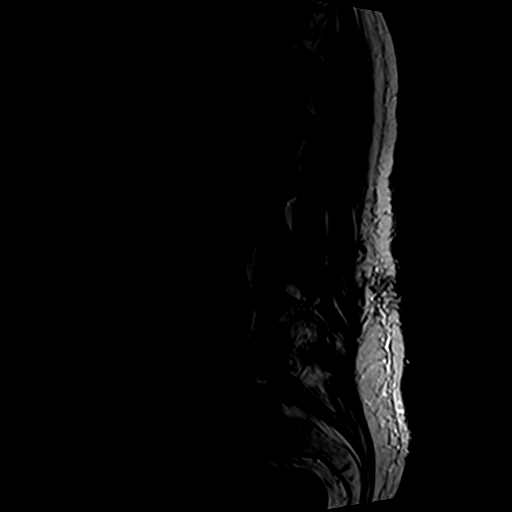
[im 10/15]
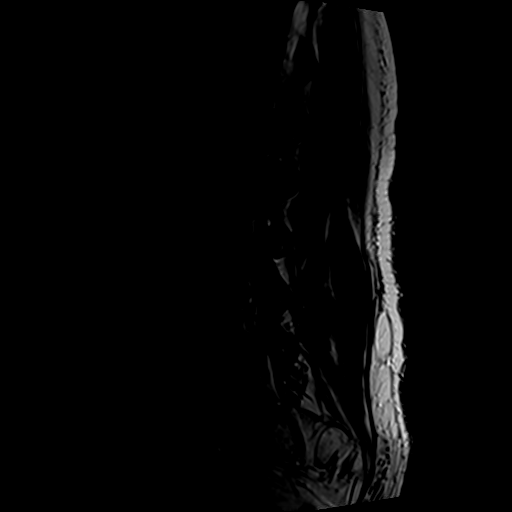
[im 15/15]
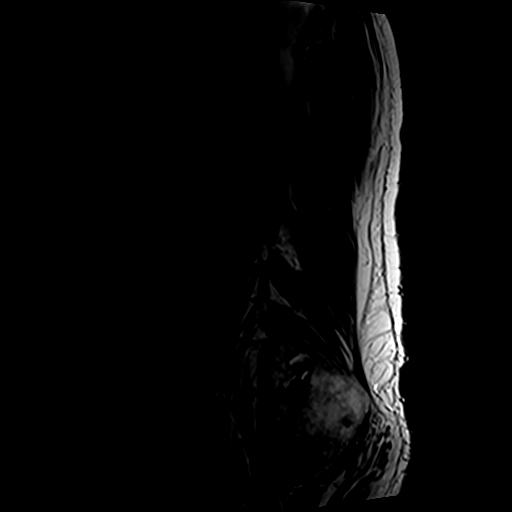

[23 of 48 positions shown; findings below may reference images not displayed]

FINDINGS: Segmentation: Mildly transitional anatomy, same numbering system
used as on the 4747 MRI designating the lowest full size disc space
L5-S1, and hypoplastic or absent ribs at T12. Correlation with
radiographs is recommended prior to any operative intervention.

Alignment:  Stable lumbar lordosis. No spondylolisthesis.

Vertebrae: There is mild marrow edema and enhancement in the right
L5 transverse process/assimilation joint (series 4, image 2). No
other No marrow edema or evidence of acute osseous abnormality.
Normal background bone marrow signal. Intact visible sacrum and SI
joints.

Conus medullaris and cauda equina: Conus extends to the T12 level.
No lower spinal cord or conus signal abnormality. No abnormal
intradural enhancement. No dural thickening.

Paraspinal and other soft tissues: Postoperative changes to the
posterior paraspinal soft tissues at the L4 level. No postoperative
fluid collection. Negative visible abdominal viscera.

Disc levels:

T11-T12 through L1-L2 remain negative.

L2-L3: Negative disc. Mild facet hypertrophy is stable. No stenosis.

L3-L4: Chronic disc desiccation and circumferential disc bulge.
Superimposed broad-based central disc protrusion has not definitely
changed (series 5, image 20). Superimposed mild to moderate facet
and ligament flavum hypertrophy with trace facet joint fluid. Stable
mild left greater than right lateral recess stenosis, mild spinal
stenosis.

L4-L5: Chronic postoperative changes with residual circumferential
disc bulge and endplate spurring. Thecal sac and left lateral recess
patency are improved. Granulation tissue and mild architectural
distortion at the left lateral recess stenosis. Mild to moderate
residual posterior element hypertrophy. Spinal stenosis just below
the disc space level is primarily due to epidural fat. Mild left and
mild to moderate right L4 foraminal stenosis appears stable.

L5-S1: Epidural fat effaces CSF from the thecal sac as before. Right
foraminal and far lateral disc osteophyte complex with mild facet
hypertrophy. Assimilation joints. Moderate to severe right L5
foraminal stenosis is stable.
IMPRESSION: 1. Mildly transitional anatomy, same numbering system used as on the
4747 MRI designating the lowest full size disc space L5-S1, and
hypoplastic or absent ribs at T12.

2. Prior surgery at L4-L5 with improved thecal sac and left lateral
recess patency since the 4747 MRI. Mild left and mild to moderate
right L4 foraminal stenosis appears unchanged.

3. Chronic L3-L4 disc degeneration with a moderate sized broad-based
central disc protrusion contributing to mild spinal and left greater
than right lateral recess stenosis, but not definitely progressed.
Query left L4 radiculitis.

4. Other lumbar levels are stable, including moderate to severe
right foraminal stenosis at L5-S1.

## 2022-02-07 ENCOUNTER — Telehealth (HOSPITAL_COMMUNITY): Payer: Self-pay

## 2022-02-08 ENCOUNTER — Encounter (HOSPITAL_COMMUNITY)
Admission: RE | Admit: 2022-02-08 | Discharge: 2022-02-08 | Disposition: A | Discharge status: Home / Self Care | Payer: HMO | Source: Ambulatory Visit | Attending: Cardiology | Admitting: Cardiology

## 2022-02-08 DIAGNOSIS — Z951 Presence of aortocoronary bypass graft: Secondary | ICD-10-CM | POA: Insufficient documentation

## 2022-02-08 DIAGNOSIS — E119 Type 2 diabetes mellitus without complications: Secondary | ICD-10-CM | POA: Diagnosis not present

## 2022-02-08 DIAGNOSIS — Z952 Presence of prosthetic heart valve: Secondary | ICD-10-CM | POA: Insufficient documentation

## 2022-02-08 LAB — GLUCOSE, CAPILLARY: Glucose-Capillary: 414 mg/dL — ABNORMAL HIGH (ref 70–99)

## 2022-02-08 NOTE — Progress Notes (Signed)
Incomplete Session Note  Patient Details  Name: Luis Lindsey MRN: [id number] Date of Birth: 1950-06-21 Referring Provider:    [name] did not complete his rehab session.  CBG 414. No exercise per protocol. Patient asymptomatic. Blood pressure 124/54. Oxygen saturation 100% on room air. Heart rate 86. [name] reported that he did not take his  Tyler Aas  this morning  and has not taken his actos since last Friday as he is out of refill's. Dr [name] Mithell's office called and notified about  today's CBG. Kiera from Dr Virgilio Belling office spoke with [name] over the phone and the office will call in refills for his actos. Dr Alroy Dust will review today's CBG and will call the patient if further instructions are needed. [name] has an appointment to see Dr Alroy Dust at 10:30 on Monday. Will cancel today's orientation appointment and reschedule when CBG's are better controlled. Pastor said that he has been taking care of his wife with dementia and sometimes does not take care of his own needs in the process.Barnet Pall, RN,BSN 02/08/2022 2:15 PM

## 2022-02-08 NOTE — Progress Notes (Signed)
Cardiac Rehab Medication Review by a Nurse  Does the patient  feel that his/her medications are working for him/her?  YES   Has the patient been experiencing any side effects to the medications prescribed?   NO  Does the patient measure his/her own blood pressure or blood glucose at home?  YES   Does the patient have any problems obtaining medications due to transportation or finances?  NO  Understanding of regimen: good Understanding of indications: fair Potential of compliance: fair    Nurse comments: Upon review of Mr Mohrmann medications he has been out of his Actos since last Friday. Keion did not take his Tyler Aas this morning. CBG 414 this afternoon. Patient's primary care provider Dr Donnie Coffin was notified.Harrell Gave RN BSN     Christa See Loni Delbridge 02/08/2022 1:39 PM

## 2022-02-12 ENCOUNTER — Ambulatory Visit (HOSPITAL_COMMUNITY): Payer: HMO

## 2022-02-12 ENCOUNTER — Telehealth (HOSPITAL_COMMUNITY): Payer: Self-pay | Admitting: Family Medicine

## 2022-02-12 DIAGNOSIS — R809 Proteinuria, unspecified: Secondary | ICD-10-CM | POA: Diagnosis not present

## 2022-02-12 DIAGNOSIS — K219 Gastro-esophageal reflux disease without esophagitis: Secondary | ICD-10-CM | POA: Diagnosis not present

## 2022-02-12 DIAGNOSIS — Z952 Presence of prosthetic heart valve: Secondary | ICD-10-CM | POA: Diagnosis not present

## 2022-02-12 DIAGNOSIS — D649 Anemia, unspecified: Secondary | ICD-10-CM | POA: Diagnosis not present

## 2022-02-12 DIAGNOSIS — I10 Essential (primary) hypertension: Secondary | ICD-10-CM | POA: Diagnosis not present

## 2022-02-12 DIAGNOSIS — E782 Mixed hyperlipidemia: Secondary | ICD-10-CM | POA: Diagnosis not present

## 2022-02-12 DIAGNOSIS — E1142 Type 2 diabetes mellitus with diabetic polyneuropathy: Secondary | ICD-10-CM | POA: Diagnosis not present

## 2022-02-12 NOTE — Progress Notes (Unsigned)
HISTORY AND PHYSICAL     CC:  follow up. Requesting Provider:  Alroy Dust, [name], MD  HPI: This is a 71 y.o. male who is here today for follow up for PAD with most recently left CFA endarterectomy with profundoplasty and endarterectomy of proximal SFA with bovine pericardial patch angioplasty and left CFA to BK popliteal bypass with PTFE 06/05/2021 Dr. Carlis Abbott.  Pt also has hx of right CEA for asymptomatic carotid artery stenosis 08/24/2019 also by Dr. Carlis Abbott.    Vascular Hx: -left EIA stenting with angioplasty 06/18/2018 Dr. Carlis Abbott -right CEA (asymptomatic) 08/24/2019 Dr. Carlis Abbott -left SFA and AK popliteal atherectomy and angioplasty 12/02/2019 Dr. Carlis Abbott -left CFA endarterectomy with profundoplasty and endarterectomy of proximal SFA with bovine pericardial patch angioplasty and left CFA to BK popliteal bypass with PTFE 06/05/2021 Dr. Carlis Abbott  In May 2023, pt underwent CABG x 3 by Dr. Kipp Brood.  His preoperative ABI and carotid duplex were done.  His left ABI was significantly improved and carotid duplex remained 1-39% bilateral ICA stenosis.    Pt was last seen 06/27/2021 and at that time, he was doing well with palpable left PT pulse.  He was having some pitting edema and mild compression sock was recommend with leg elevation.  He was to continue his asa/plavix/statin.   The pt returns today for follow up.  He states that he has been having some weakness in the LLE that started about 2 weeks ago.  He states he feels like his leg is going to give out on him.  Prior to this, he was ambulating well.  He does not have any rest pain, claudication or non healing wounds.  He states the swelling he had post op has resolved.  He does have hx of back issues and has had back surgery in the past for pain he was having in his left leg.    The pt is on a statin for cholesterol management.    The pt is on an aspirin.    Other AC:  Plavix The pt is on ACEI for hypertension.  The pt does  have diabetes. Tobacco hx:   former  Pt does not have family hx of AAA.  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   Dysphagia    Esophageal dysmotility    GERD (gastroesophageal reflux disease)    Heart murmur    pt had recent echocardiogram   History of hiatal hernia    History of Holter monitoring 2009   History of kidney stones    HNP (herniated nucleus pulposus), lumbar    L4-5   HYPERCHOLESTEROLEMIA 11/30/2009   Hyperlipidemia    HYPERTENSION 11/30/2009   Kidney stones    Numbness and tingling    Peripheral vascular disease ([place])    Right carotid bruit    SMOKER 11/30/2009   Stroke ([place]) 2012   URINARY CALCULUS 11/30/2009    Past Surgical History:  Procedure Laterality Date   ABDOMINAL AORTOGRAM W/LOWER EXTREMITY N/A 06/18/2018   Procedure: ABDOMINAL AORTOGRAM W/LOWER EXTREMITY;  Surgeon: Marty Heck, MD;  Location: [facility] [facility];  Service: Cardiovascular;  Laterality: N/A;   ABDOMINAL AORTOGRAM W/LOWER EXTREMITY N/A 12/02/2019   Procedure: ABDOMINAL AORTOGRAM W/LOWER EXTREMITY;  Surgeon: Marty Heck, MD;  Location: [place] [facility];  Service: Cardiovascular;  Laterality: N/A;   ABDOMINAL AORTOGRAM W/LOWER EXTREMITY N/A 03/23/2021   Procedure: ABDOMINAL AORTOGRAM W/LOWER EXTREMITY;  Surgeon: Marty Heck, MD;  Location: [place] [facility];  Service: Cardiovascular;  Laterality: N/A;   AORTIC VALVE REPLACEMENT N/A 10/25/2021   Procedure: AORTIC VALVE REPLACEMENT (AVR)USING 23MM INSPIRIS RESILIA  AORTIC VALVE;  Surgeon: Lajuana Matte, MD;  Location: [place];  Service: Open Heart Surgery;  Laterality: N/A;   arthroscopy of right shoulder Right    Jan 2019, June 2019-- done at [place], Dr. Ninfa Linden   BACK SURGERY     CATARACT EXTRACTION W/ INTRAOCULAR LENS   IMPLANT, BILATERAL     CORONARY ARTERY BYPASS GRAFT N/A 10/25/2021   Procedure: CORONARY ARTERY BYPASS GRAFTING (CABG) X 3 USING LEFT INTERNAL MAMMARY ARTERY AND RIGHT GREATER SAPHENOUS VEIN;  Surgeon: Lajuana Matte, MD;  Location: [facility];  Service: Open Heart Surgery;  Laterality: N/A;   CYSTOSCOPY     ENDARTERECTOMY Right 08/24/2019   Procedure: ENDARTERECTOMY CAROTID;  Surgeon: Marty Heck, MD;  Location: [facility];  Service: Vascular;  Laterality: Right;   ENDARTERECTOMY FEMORAL Left 06/05/2021   Procedure: ENDARTERECTOMY FEMORAL WITH PROFUNDAPLASTY;  Surgeon: Marty Heck, MD;  Location: [place];  Service: Vascular;  Laterality: Left;   ENDOVEIN HARVEST OF GREATER SAPHENOUS VEIN Right 10/25/2021   Procedure: ENDOVEIN HARVEST OF GREATER SAPHENOUS VEIN;  Surgeon: Lajuana Matte, MD;  Location: [facility] [facility];  Service: Open Heart Surgery;  Laterality: Right;   FACIAL COSMETIC SURGERY  2014   FEMORAL-POPLITEAL BYPASS GRAFT Left 06/05/2021   Procedure: LEFT FEMORAL-POPLITEAL ARTERY BYPASS GRAFTING USING 25m PROPATEN VASCULAR REMOVABLE RING GRAFT;  Surgeon: CMarty Heck MD;  [place] [place]  Service: Vascular;  Laterality: Left;   FINGER SURGERY  2002   LEFT HEART CATH AND CORONARY ANGIOGRAPHY N/A 10/05/2021   Procedure: LEFT HEART CATH AND CORONARY ANGIOGRAPHY;  Surgeon: ENelva Bush MD;  [facility];  Service: Cardiovascular;  Laterality: N/A;   LUMBAR LAMINECTOMY/DECOMPRESSION MICRODISCECTOMY Left 08/01/2018   Procedure: Microdiscectomy - Lumbar four-Lumbar five - left;  Surgeon: JEustace Moore MD;  [place] [facility]  Service: Neurosurgery;  Laterality: Left;   LUMBAR LAMINECTOMY/DECOMPRESSION MICRODISCECTOMY Left 10/15/2018   Procedure: Re-do Microdiscectomy - left - Lumbar four-Lumbar five;  Surgeon: JEustace Moore MD;  [place] [facility]  Service: Neurosurgery;  Laterality: Left;   NECK SURGERY  1986   PATCH ANGIOPLASTY Left 06/05/2021   Procedure: PATCH  ANGIOPLASTY USING XRueben BashBIOLOGIC PATCH;  Surgeon: CMarty Heck MD;  [place] [facility]  Service: Vascular;  Laterality: Left;   PERIPHERAL VASCULAR ATHERECTOMY  12/02/2019   Procedure: PERIPHERAL VASCULAR ATHERECTOMY;  Surgeon: CMarty Heck MD;  [facility];  Service: Cardiovascular;;  left SFA   PERIPHERAL VASCULAR INTERVENTION  06/18/2018   Procedure: PERIPHERAL VASCULAR INTERVENTION;  Surgeon: CMarty Heck MD;  [facility];  Service: Cardiovascular;;  Left external iliac   ROTATOR CUFF REPAIR Left    TEE WITHOUT CARDIOVERSION N/A 10/25/2021   Procedure: TRANSESOPHAGEAL ECHOCARDIOGRAM (TEE);  Surgeon: LLajuana Matte MD;  [place] [facility]  Service: Open Heart Surgery;  Laterality: N/A;   UMBILICAL HERNIA REPAIR      Allergies  Allergen Reactions   Codeine Nausea Only    Current Outpatient Medications  Medication Sig Dispense Refill   aspirin EC 81 MG EC tablet Take 1 tablet (  81 mg total) by mouth daily at 6 (six) AM.     benazepril (LOTENSIN) 20 MG tablet Take 1 tablet (20 mg total) by mouth daily. 90 tablet 3   clopidogrel (PLAVIX) 75 MG tablet TAKE 1 TABLET BY MOUTH EVERY DAY 90 tablet 2   Continuous Blood Gluc Sensor (FREESTYLE LIBRE 2 SENSOR) MISC AS DIRECTED FOR BLOOD SUGAR CHECK DX E11.42     ezetimibe (ZETIA) 10 MG tablet Take 1 tablet (10 mg total) by mouth daily. 90 tablet 3   insulin degludec (TRESIBA) 200 UNIT/ML FlexTouch Pen Inject 34 Units into the skin in the morning.     metFORMIN (GLUCOPHAGE-XR) 500 MG 24 hr tablet Take 2 tablets (1,000 mg total) by mouth 2 (two) times daily.     ONETOUCH VERIO test strip 1 each 3 (three) times daily.     pantoprazole (PROTONIX) 40 MG tablet Take 40 mg by mouth daily with breakfast.      pioglitazone (ACTOS) 45 MG tablet TAKE 1 TABLET DAILY (NEED APPOINTMENT FOR FURTHER REFILLS) (Patient not taking: Reported on 02/08/2022) 90 tablet 0   rosuvastatin (CRESTOR) 40 MG tablet  Take 40 mg by mouth daily.      No current facility-administered medications for this visit.    Family History  Problem Relation Age of Onset   Diabetes Sister    Diabetes Mellitus I Sister    Stroke Father    Heart disease Father    CVA Father    Coronary artery disease Father    Diabetes Mother    Heart disease Mother    Diabetes Mellitus I Mother    Coronary artery disease Mother    Coronary artery disease Brother    Heart disease Brother    Coronary artery disease Brother     Social History   Socioeconomic History   Marital status: Married    Spouse name: [name]    Number of children: 3   Years of education: 11   Highest education level: Not on file  Occupational History   Occupation: Truck Education administrator: [employer]  Tobacco Use   Smoking status: Former    Years: 40.00    Types: Cigarettes    Quit date: 12/2015    Years since quitting: 6.1   Smokeless tobacco: Never   Tobacco comments:    Quit 12-2012  Vaping Use   Vaping Use: Never used  Substance and Sexual Activity   Alcohol use: No   Drug use: No   Sexual activity: Not on file  Other Topics Concern   Not on file  Social History Narrative   . Patient drinks 2-3 cups of caffeine daily.    Patient lives at home with his wife [name].    Patient works at [employer].    Education. 12 th grade   Right handed               Social Determinants of Health   Financial Resource Strain: Not on file  Food Insecurity: No Food Insecurity (03/16/2020)   Hunger Vital Sign    Worried About Running Out of Food in the Last Year: Never true    Ran Out of Food in the Last Year: Never true  Transportation Needs: No Transportation Needs (03/16/2020)   PRAPARE - Hydrologist (Medical): No    Lack of Transportation (Non-Medical): No  Physical Activity: Not on file  Stress: Not on file  Social Connections: Not on file  Intimate  Partner Violence: Not on file     REVIEW OF SYSTEMS:   '[X]'$   denotes positive finding, '[ ]'$  denotes negative finding Cardiac  Comments:  Chest pain or chest pressure:    Shortness of breath upon exertion:    Short of breath when lying flat:    Irregular heart rhythm:        Vascular    Pain in calf, thigh, or hip brought on by ambulation:    Pain in feet at night that wakes you up from your sleep:     Blood clot in your veins:    Leg swelling:         Pulmonary    Oxygen at home:    Productive cough:     Wheezing:         Neurologic    Weakness in left leg x   Sudden numbness in arms or legs:     Sudden onset of difficulty speaking or slurred speech:    Temporary loss of vision in one eye:     Problems with dizziness:         Gastrointestinal    Blood in stool:     Vomited blood:         Genitourinary    Burning when urinating:     Blood in urine:        Psychiatric    Major depression:         Hematologic    Bleeding problems:    Problems with blood clotting too easily:        Skin    Rashes or ulcers:        Constitutional    Fever or chills:      PHYSICAL EXAMINATION:  Today's Vitals   02/13/22 1344  BP: (!) 136/57  Pulse: 77  Resp: 20  Temp: 97.8 F (36.6 C)  TempSrc: Temporal  SpO2: 100%  Weight: 161 lb (73 kg)  Height: '5\' 10"'$  (1.778 m)   Body mass index is 23.1 kg/m.   General:  WDWN in NAD; vital signs documented above Gait: Not observed HENT: WNL, normocephalic Pulmonary: normal non-labored breathing , without wheezing Cardiac: regular HR, without carotid bruits Abdomen: soft, NT; aortic pulse is not palpable Skin: without rashes Vascular Exam/Pulses:  Right Left  Radial 2+ (normal) 2+ (normal)  Femoral 2+ (normal) 2+ (normal)  DP Brisk biphasic doppler Faint doppler  PT Brisk biphasic doppler Brisk biphasic doppler  Peroneal Not examined Brisk biphasic doppler   Extremities: without ischemic changes, without Gangrene , without cellulitis; without open wounds Musculoskeletal: no muscle  wasting or atrophy  Neurologic: A&O X 3 Psychiatric:  The pt has Normal affect.   Non-Invasive Vascular Imaging:   ABI's/TBI's on 02/13/2022: Right:  0.73/0.51 - Great toe pressure: 91 Left:  1.01/0.72 - Great toe pressure: 128  Arterial duplex on 02/13/2022: +--------+--------+-----+---------------+---------+------------------------  LEFT    PSV cm/sRatioStenosis       Waveform Comments                  +--------+--------+-----+---------------+---------+------------------------  CFA [id number]                         biphasic                           +--------+--------+-----+---------------+---------+------------------------  DFA     346          50-74% stenosisbiphasic s/p  profundoplasty, 50-99% restenosis                +--------+--------+-----+---------------+---------+------------------------  TP [id number]                         triphasic                          +--------+--------+-----+---------------+---------+------------------------    Left Graft #1: CFA to BK Popliteal Artery  +--------------------+--------+--------+---------+--------+                      PSV cm/sStenosisWaveform Comments  +--------------------+--------+--------+---------+--------+  Inflow              132             triphasic          +--------------------+--------+--------+---------+--------+  Proximal Anastomosis196             biphasic           +--------------------+--------+--------+---------+--------+  Proximal Graft      159             biphasic           +--------------------+--------+--------+---------+--------+  Mid Graft           61              biphasic           +--------------------+--------+--------+---------+--------+  Distal Graft        66              triphasic          +--------------------+--------+--------+---------+--------+  Distal Anastomosis  91              triphasic           +--------------------+--------+--------+---------+--------+  Outflow             121             triphasic          +--------------------+--------+--------+---------+--------+   Widely patent left common femoral artery to below-the-knee popliteal artery bypass graft without evidence of stenosis.   Summary:  Left: Marked improvement is noted compared to previous study.   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  Widely patent left common femoral artery to below-the-knee popliteal artery bypass graft without evidence of stenosis.    Previous ABI's/TBI's on 10/24/2021: Right:  0.75/0.49 - Great toe pressure: 75 Left:  0.97/0.70 - Great toe pressure:  108  Carotid duplex 10/24/2021: Bilateral ICA stenosis 1-39%   ASSESSMENT/PLAN:: 71 y.o. male here for follow up for PAD with most recently left CFA endarterectomy with profundoplasty and endarterectomy of proximal SFA with bovine pericardial patch angioplasty and left CFA to BK popliteal bypass with PTFE 06/05/2021 Dr. Carlis Abbott.  Pt also has hx of right CEA for asymptomatic carotid artery stenosis 08/24/2019 also by Dr. Carlis Abbott.    PAD -pt ABI improved and pt's preoperative leg pain also improved.  He does have stenosis at the profundoplasty.  The bypass graft is patent without stenosis.  Will have him return in 3 months for close follow up for the profunda stenosis.  He has started having weakness in the left leg that started about 2 weeks ago.  He states he has hx of back surgery x 3 in the past.  He will f/u with ortho for back evaluation.    Carotid artery stenosis s/p right CEA March 2021 -duplex in  May 2023 revealed 1-39% bilateral ICA stenosis.  He remains asymptomatic.    -continue asa/statin/plavix. -pt will f/u in 3 months with ABI and LLE arterial duplex.  He knows to call sooner if he has any issues such as rest pain or non healing wounds of the left foot.   Leontine Locket, [facility] [phone]  Clinic MD:   Carlis Abbott

## 2022-02-13 ENCOUNTER — Ambulatory Visit (INDEPENDENT_AMBULATORY_CARE_PROVIDER_SITE_OTHER)
Admission: RE | Admit: 2022-02-13 | Discharge: 2022-02-13 | Disposition: A | Discharge status: Home / Self Care | Payer: HMO | Source: Ambulatory Visit | Attending: Vascular Surgery | Admitting: Vascular Surgery

## 2022-02-13 ENCOUNTER — Encounter: Payer: Self-pay | Admitting: Physician Assistant

## 2022-02-13 ENCOUNTER — Ambulatory Visit (INDEPENDENT_AMBULATORY_CARE_PROVIDER_SITE_OTHER): Payer: HMO | Admitting: Physician Assistant

## 2022-02-13 ENCOUNTER — Ambulatory Visit (HOSPITAL_COMMUNITY)
Admission: RE | Admit: 2022-02-13 | Discharge: 2022-02-13 | Disposition: A | Discharge status: Home / Self Care | Payer: HMO | Source: Ambulatory Visit | Attending: Vascular Surgery | Admitting: Vascular Surgery

## 2022-02-13 VITALS — BP 136/57 | HR 77 | Temp 97.8°F | Resp 20 | Ht 70.0 in | Wt 161.0 lb

## 2022-02-13 DIAGNOSIS — I739 Peripheral vascular disease, unspecified: Secondary | ICD-10-CM | POA: Diagnosis not present

## 2022-02-13 DIAGNOSIS — M51369 Other intervertebral disc degeneration, lumbar region without mention of lumbar back pain or lower extremity pain: Secondary | ICD-10-CM | POA: Insufficient documentation

## 2022-02-13 DIAGNOSIS — T819XXA Unspecified complication of procedure, initial encounter: Secondary | ICD-10-CM | POA: Insufficient documentation

## 2022-02-13 DIAGNOSIS — M5136 Other intervertebral disc degeneration, lumbar region: Secondary | ICD-10-CM | POA: Insufficient documentation

## 2022-02-14 ENCOUNTER — Ambulatory Visit (HOSPITAL_COMMUNITY): Payer: HMO

## 2022-02-16 ENCOUNTER — Ambulatory Visit (HOSPITAL_COMMUNITY): Payer: HMO

## 2022-02-19 ENCOUNTER — Other Ambulatory Visit: Payer: Self-pay

## 2022-02-19 ENCOUNTER — Ambulatory Visit (HOSPITAL_COMMUNITY): Payer: HMO

## 2022-02-19 DIAGNOSIS — I739 Peripheral vascular disease, unspecified: Secondary | ICD-10-CM

## 2022-02-21 ENCOUNTER — Ambulatory Visit (HOSPITAL_COMMUNITY): Payer: HMO

## 2022-02-22 NOTE — Progress Notes (Deleted)
Cardiology Office Note:    Date:  02/22/2022   ID:  Luis Lindsey, DOB [birth year], MRN [id number]  PCP:  Aurea Graff.[name], MD  [facility] Cardiologist:  Freada Bergeron, MD  [place] Electrophysiologist:  None   Referring MD: Alroy Dust, Carlean Jews.[name], MD    History of Present Illness:    Luis Lindsey is a 71 y.o. male with a hx of PAD s/p L SFA atherectomy with angioplasty, carotid artery disease s/p L CEA, HTN, HLD, moderate AS, and tobacco use who returns to clinic for follow-up.  Patient was initially seen in 03/2020 after having episodes of lightheadedness in the setting of anemia, moderate AS, and relative hypotension. We stopped his HCTZ and decreased his metop to '25mg'$  XL daily. Symptoms resolved. TTE 08/2019 with normal EF. Moderate aortic valve stenosis. Aortic valve area, by VTI measures 1.21 cm. Aortic valve mean gradient measures 14.5 mmHg. Aortic valve Vmax measures 2.76 m/s.   Had abdominal aortogram in 02/2021 with Dr. Carlis Abbott where he was found to have ostial high grade SFA stenosis. Was recommended for bypass at that time. He subsequently underwent  left common femoral endarterectomy with profundoplasty and endarterectomy of the proximal SFA with bovine pericardial patch angioplasty and left common femoral to below-knee popliteal artery bypass on 06/05/21 without issues. Was discharged on ASA and plavix.   Seen in clinic on 06/29/20 where he was doing well. Had stable exertional dyspnea but no chest pain. Repeat TTE 06/2020 with EF 60-65%, G1DD AVA 1.33, mean gradient 44mHg, Vmax 2.84.   Seen by me on 08/28/21 where he had episode of substernal chest pressure radiating down his left arm. Coronary CTA was ordered which revealed coronary artery calcium score 4204, 98th percentile for age and gender.  Aortic valve calcium score 1928, very heavy proximal to mid coronary calcification so difficult to quantify degree of stenosis.  Suspicion of severe ostial RCA stenosis as well.  Proximal LAD, ramus, and LCx appear moderately stenosed.  Possible severe mid LCx stenosis with FFR that revealed multiple hemodynamically significant stenoses. He was advised to return to our office for discussion of cardiac catheterization.   Was seen on 10/03/2021 by VRobbie Lis PA with worsening chest pain concerning for angina. Cardiac catheterization was scheduled for 10/05/2021 and revealed severe multivessel coronary disease including diffuse LMCA disease of up to 70% with pressure dampening of 38F diagnostic catheter, 90% distal LCx stenosis, and sequential 70-80% ostial, 40% proximal, 80% mid, and 50% distal RCA lesions. Widespread heavy calcification of the coronary arteries is evident, upper normal left ventricular filling pressure (LVEDP 15 mmHg), mild to moderate aortic valve stenosis (peak-to-peak gradient 21 mmHg). He was seen by CT surgery for CABG consultation. On 10/25/21 he underwent CABG x 3 utilizing LIMA to LAD, SVG to PDA, and SVG to OM1 and aortic valve replacement with a 23 mm Edwards Resilia Valve, and endoscopic harvest of greater saphenous vein from right leg. Beta blockers were held due to issues with junctional rhythm alternating with complete heart block but returned to normal sinus rhythm by POD 3. He was discharged on 10/31/21.  Was seen by MChristen Bame NP on 11/16/21 in follow-up. Was having dizziness with position changes and midsternal chest soreness. Was noted to be in a junctional rhythm with occasionally conducted sinus beats  Was last seen on 11/27/21 where he was having dizziness. He was in a junctional rhythm at that time. Cardiac monitor 12/01/21 showed 11 runs of nonsustained SVT with rare ectopy. TTE 12/05/21  with LVEF 55-60%, normal RV, mild LAE, trivial MR, well functionin 70m Inpsiris Resilla valve with mean gradient 174mg, mild dilation of the ascending aorta 3871m  Was seen by Dr. CamCurt Bears 12/04/21. Given that his cardiac monitor showed no recurrence of  junctional bradycardia. PPM was deemed not needed at that time.  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   Dysphagia    Esophageal dysmotility    GERD (gastroesophageal reflux disease)    Heart murmur    pt had recent echocardiogram   History of hiatal hernia    History of Holter monitoring 2009   History of kidney stones    HNP (herniated nucleus pulposus), lumbar    L4-5   HYPERCHOLESTEROLEMIA 11/30/2009   Hyperlipidemia    HYPERTENSION 11/30/2009   Kidney stones    Numbness and tingling    [place]ipheral vascular disease ([place]  Right carotid bruit    SMOKER 11/30/2009   Stroke ([place]   URINARY CALCULUS 11/30/2009    Past Surgical History:  Procedure Laterality Date   ABDOMINAL AORTOGRAM W/LOWER EXTREMITY N/A 06/18/2018   Procedure: ABDOMINAL AORTOGRAM W/LOWER EXTREMITY;  Surgeon: ClaMarty HeckD;  Location[facility];  Service: Cardiovascular;  Laterality: N/A;   ABDOMINAL AORTOGRAM W/LOWER EXTREMITY N/A 12/02/2019   Procedure: ABDOMINAL AORTOGRAM W/LOWER EXTREMITY;  Surgeon: ClaMarty HeckD;  [facility]: [facility];  Service: Cardiovascular;  Laterality: N/A;   ABDOMINAL AORTOGRAM W/LOWER EXTREMITY N/A 03/23/2021   Procedure: ABDOMINAL AORTOGRAM W/LOWER EXTREMITY;  Surgeon: ClaMarty HeckD;  [facility];  Service: Cardiovascular;  Laterality: N/A;   AORTIC VALVE REPLACEMENT N/A 10/25/2021   Procedure: AORTIC VALVE REPLACEMENT (AVR)USING 23MM INSPIRIS RESILIA  AORTIC VALVE;  Surgeon: LigLajuana MatteD;  [facility];  Laterality: N/A;   arthroscopy of right shoulder Right    Jan 2019, June 2019-- [facility]     CATARACT  EXTRACTION W/ INTRAOCULAR LENS  IMPLANT, BILATERAL     CORONARY ARTERY BYPASS GRAFT N/A 10/25/2021   Procedure: CORONARY ARTERY BYPASS GRAFTING (CABG) X 3 USING LEFT INTERNAL MAMMARY ARTERY AND RIGHT GREATER SAPHENOUS VEIN;  Surgeon: LigLajuana MatteD;  [facility];  Laterality: N/A;   CYSTOSCOPY     ENDARTERECTOMY Right 08/24/2019   Procedure: ENDARTERECTOMY CAROTID;  Surgeon: ClaMarty HeckD;  [facility] [place];  Laterality: Right;   ENDARTERECTOMY FEMORAL Left 06/05/2021   Procedure: ENDARTERECTOMY FEMORAL WITH PROFUNDAPLASTY;  Surgeon: ClaMarty HeckD;  [facility];  Laterality: Left;   ENDOVEIN HARVEST OF GREATER SAPHENOUS VEIN Right 10/25/2021   Procedure: ENDOVEIN HARVEST OF GREATER SAPHENOUS VEIN;  Surgeon: LigLajuana MatteD;  [facility];  Laterality: Right;   FACIAL COSMETIC SURGERY  2014   FEMORAL-POPLITEAL BYPASS GRAFT Left 06/05/2021   Procedure: LEFT FEMORAL-POPLITEAL ARTERY BYPASS GRAFTING USING 6mm92mOPATEN VASCULAR REMOVABLE RING GRAFT;  Surgeon: ClarMarty Heck;  [facility];  Laterality: Left;   FINGER SURGERY  2002   LEFT HEART CATH AND CORONARY ANGIOGRAPHY N/A 10/05/2021   Procedure: LEFT HEART CATH AND CORONARY ANGIOGRAPHY;  Surgeon: Nelva Bush, MD;  Location: [place] [facility];  Service: Cardiovascular;  Laterality: N/A;   LUMBAR LAMINECTOMY/DECOMPRESSION MICRODISCECTOMY Left 08/01/2018   Procedure: Microdiscectomy - Lumbar four-Lumbar five - left;  Surgeon: Eustace Moore, MD;  Location: [place];  Service: Neurosurgery;  Laterality: Left;   LUMBAR LAMINECTOMY/DECOMPRESSION MICRODISCECTOMY Left 10/15/2018   Procedure: Re-do Microdiscectomy - left - Lumbar four-Lumbar five;  Surgeon: Eustace Moore, MD;  Location: [place];  Service: Neurosurgery;  Laterality: Left;   NECK SURGERY  1986   PATCH ANGIOPLASTY Left  06/05/2021   Procedure: PATCH ANGIOPLASTY USING Rueben Bash BIOLOGIC PATCH;  Surgeon: Marty Heck, MD;  Location: [place];  Service: Vascular;  Laterality: Left;   PERIPHERAL VASCULAR ATHERECTOMY  12/02/2019   Procedure: PERIPHERAL VASCULAR ATHERECTOMY;  Surgeon: Marty Heck, MD;  Location: [place] [facility];  Service: Cardiovascular;;  left SFA   PERIPHERAL VASCULAR INTERVENTION  06/18/2018   Procedure: PERIPHERAL VASCULAR INTERVENTION;  Surgeon: Marty Heck, MD;  Location: [place] [facility];  Service: Cardiovascular;;  Left external iliac   ROTATOR CUFF REPAIR Left    TEE WITHOUT CARDIOVERSION N/A 10/25/2021   Procedure: TRANSESOPHAGEAL ECHOCARDIOGRAM (TEE);  Surgeon: Lajuana Matte, MD;  Location: [place];  Service: Open Heart Surgery;  Laterality: N/A;   UMBILICAL HERNIA REPAIR      Current Medications: No outpatient medications have been marked as taking for the 02/28/22 encounter (Appointment) with Freada Bergeron, MD.     Allergies:   Codeine   Social History   Socioeconomic History   Marital status: Married    Spouse name: [name]    Number of children: 3   Years of education: 11   Highest education level: Not on file  Occupational History   Occupation: Truck Education administrator: [employer]  Tobacco Use   Smoking status: Former    Years: 40.00    Types: Cigarettes    Quit date: 12/2015    Years since quitting: 6.1    Passive exposure: Current (Wife Is a smoker)   Smokeless tobacco: Never   Tobacco comments:    Quit 12-2012  Vaping Use   Vaping Use: Never used  Substance and Sexual Activity   Alcohol use: No   Drug use: No   Sexual activity: Not on file  Other Topics Concern   Not on file  Social History Narrative   . Patient drinks 2-3 cups of caffeine daily.    Patient lives at home with his wife [name].    Patient works at [employer].    Education. 12 th grade   Right handed               Social Determinants of Health   Financial  Resource Strain: Not on file  Food Insecurity: No Food Insecurity (03/16/2020)   Hunger Vital Sign    Worried About Running Out of Food in the Last Year: Never true    Ran Out of Food in the Last Year: Never true  Transportation Needs: No Transportation Needs (03/16/2020)   PRAPARE - Hydrologist (Medical): No    Lack of Transportation (Non-Medical): No  Physical Activity: Not on file  Stress: Not on file  Social Connections: Not on file     Family History: The patient's family history includes CVA in his father; Coronary  artery disease in his brother, brother, father, and mother; Diabetes in his mother and sister; Diabetes Mellitus I in his mother and sister; Heart disease in his brother, father, and mother; Stroke in his father.  ROS:   Please see the history of present illness.    Review of Systems  Constitutional:  Positive for malaise/fatigue. Negative for chills and fever.  HENT:  Negative for nosebleeds.   Respiratory:  Positive for shortness of breath.   Cardiovascular:  Positive for chest pain (Left), palpitations (Nocturnal) and orthopnea. Negative for claudication, leg swelling and PND.  Gastrointestinal:  Negative for nausea and vomiting.  Genitourinary:  Negative for hematuria.  Musculoskeletal:  Negative for falls.  Neurological:  Positive for dizziness. Negative for loss of consciousness.  Endo/Heme/Allergies:  Negative for polydipsia.  Psychiatric/Behavioral:  Negative for substance abuse.     EKGs/Labs/Other Studies Reviewed:    The following studies were reviewed today: TTE 02-Jan-2022: IMPRESSIONS     1. Compared with echo 08/2021, there is now a well-functioning  bioprosthetic valve in the aortic position.   2. Left ventricular ejection fraction, by estimation, is 55 to 60%. The  left ventricle has normal function. The left ventricle has no regional  wall motion abnormalities. There is mild concentric left ventricular   hypertrophy. Left ventricular diastolic  parameters were normal. The average left ventricular global longitudinal  strain is -23.0 %. The global longitudinal strain is normal.   3. Right ventricular systolic function is normal. The right ventricular  size is normal. There is normal pulmonary artery systolic pressure.   4. Left atrial size was mildly dilated.   5. The mitral valve is normal in structure. Trivial mitral valve  regurgitation. No evidence of mitral stenosis.   6. The aortic valve has been repaired/replaced. Aortic valve  regurgitation is not visualized. No aortic stenosis is present. There is a  23 mm Inpsiris Resilia valve present in the aortic position. Procedure  Date: 10/25/21. Echo findings are consistent  with normal structure and function of the aortic valve prosthesis. Aortic  valve mean gradient measures 10.0 mmHg. Aortic valve Vmax measures 2.12  m/s.   7. Aortic dilatation noted. There is mild dilatation of the ascending  aorta, measuring 38 mm.   8. The inferior vena cava is normal in size with greater than 50%  respiratory variability, suggesting right atrial pressure of 3 mmHg.   Comparison(s): TEE 10/25/21 EF 60-65%. Mild AI. Moderate AS.  Cardiac Monitor 12/01/21: Patch wear time was 6 days and 22 hours Predominant rhythm was NSR with average HR 71bpm There were 11 runs of nonsustained SVT with longest lasting 5 beats Rare SVE (<1%), rare VE (<1%) Patient triggered events correlate with NSR and NSR with PVCs No Afib, junctional rhythms or significant pauses noted     Patch Wear Time:  6 days and 22 hours (2023-06-22T09:20:00-0400 to 2023-06-29T08:06:25-0400)   Patient had a min HR of 55 bpm, max HR of 141 bpm, and avg HR of 71 bpm. Predominant underlying rhythm was Sinus Rhythm. 11 Supraventricular Tachycardia runs occurred, the run with the fastest interval lasting 4 beats with a max rate of 141 bpm, the  longest lasting 5 beats with an avg rate of 114  bpm. Isolated SVEs were rare (<1.0%), SVE Couplets were rare (<1.0%), and SVE Triplets were rare (<1.0%). Isolated VEs were rare (<1.0%), and no VE Couplets or VE Triplets were present.   LHC 10/05/21   Severe multivessel coronary disease, including diffuse LMCA disease of  up to 70% with pressure dampening of 83F diagnostic catheter, 90% distal LCx stenosis, and sequential 70-80% ostial, 40% proximal, 80% mid, and 50% distal RCA lesions.  Widespread heavy calcification of the coronary arteries is evident. Upper normal left ventricular filling pressure (LVEDP 15 mmHg). Mild-moderate aortic valve stenosis (peak-to-peak gradient 21 mmHg).   Recommendations: Outpatient cardiac surgery consultation for CABG. Aggressive secondary prevention of coronary artery disease. Continue dual antiplatelet therapy with aspirin and clopidogrel pending cardiac surgery evaluation and input from vascular surgery regarding interruption of clopidogrel in the setting of lower extremity interventions earlier this year   Coronary CT 09/27/21   1. Coronary artery calcium score 4204 Agatston units. This places the patient in the [place] percentile for age and gender.   2.  Aortic valve calcium score 1928 Agatston units.   3. Very heavy proximal-mid coronary calcification so difficult to quantify degree of stenosis. I suspect that there is severe ostial RCA stenosis. Proximal LAD, ramus, and LCx appear moderately stenosed. Possible severe mid LCx stenosis. Will send for FFR.   Echo 09/12/21    1. Mild to moderate AS (mean gradient 13 mmHg; AVA 1.5 cm2); gradient  slightly less on present study compared to 07/18/20.   2. Left ventricular ejection fraction, by estimation, is 60 to 65%. The  left ventricle has normal function. The left ventricle has no regional  wall motion abnormalities. Left ventricular diastolic parameters are  consistent with Grade I diastolic  dysfunction (impaired relaxation). The average left  ventricular global  longitudinal strain is -20.9 %. The global longitudinal strain is normal.   3. Right ventricular systolic function is normal. The right ventricular  size is normal. Tricuspid regurgitation signal is inadequate for assessing  PA pressure.   4. The mitral valve is normal in structure. Trivial mitral valve  regurgitation. No evidence of mitral stenosis.   5. The aortic valve is tricuspid. Aortic valve regurgitation is not  visualized. Mild to moderate aortic valve stenosis.   6. The inferior vena cava is normal in size with greater than 50%  respiratory variability, suggesting right atrial pressure of 3 mmHg.    EKG:  EKG is personally reviewed. 11/27/2021:  Sinus rhythm. Rate 70 bpm. 11/16/2021 Christen Bame, NP):  accelerated junctional rhythm at 63 bpm, reviewed with Drs. Johney Frame and Lovena Le.    Recent Labs: 10/24/2021: ALT 16 10/26/2021: Magnesium 2.2 11/17/2021: BUN 25; Creatinine, Ser 1.01; Hemoglobin 10.6; Platelets 477; Potassium 4.5; Sodium 133   Recent Lipid Panel    Component Value Date/Time   CHOL 94 06/06/2021 0329   TRIG 59 06/06/2021 0329   HDL 39 (L) 06/06/2021 0329   CHOLHDL 2.4 06/06/2021 0329   VLDL 12 06/06/2021 0329   LDLCALC 43 06/06/2021 0329     Physical Exam:    VS:  There were no vitals taken for this visit.    Wt Readings from Last 3 Encounters:  02/13/22 161 lb (73 kg)  12/07/21 161 lb (73 kg)  12/04/21 161 lb 6.4 oz (73.2 kg)     GEN:  Well nourished, well developed in no acute distress HEENT: Normal NECK: No JVD; no carotid bruits CARDIAC: RRR, 2/6 systolic murmur. RESPIRATORY:  Clear to auscultation without rales, wheezing or rhonchi  ABDOMEN: Soft, non-tender, non-distended MUSCULOSKELETAL:  No edema; No deformity  SKIN: Warm and dry NEUROLOGIC:  Alert and oriented x 3 PSYCHIATRIC:  Normal affect   ASSESSMENT:    No diagnosis found.   PLAN:    In order of  problems listed above:  #Junctional  Rhythm: Patient developed junctional rhythm alternating with complete heart block following AVR. This resolved but then recurred on 11/16/21. Cardiac monitor showed no recurrence of the rhythm fortunately and he was in NSR at follow-up with Dr. Curt Bears. Will continue to monitor at this time. -Will continue to monitor -Holding nodal agents  #Coronary Artery Disease s/p CABG x3: Cath 10/05/21 with severe multivessel coronary disease including diffuse LMCA disease of up to 70% with pressure dampening of 27F diagnostic catheter, 90% distal LCx stenosis, and sequential 70-80% ostial, 40% proximal, 80% mid, and 50% distal RCA lesions. Widespread heavy calcification of the coronary arteries is evident, upper normal left ventricular filling pressure (LVEDP 15 mmHg), mild to moderate aortic valve stenosis (peak-to-peak gradient 21 mmHg).He ultimately underwent CABGx3 on 10/25/21  utilizing LIMA to LAD, SVG to PDA, and SVG to OM1 and aortic valve replacement with a 23 mm Edwards Resilia Valve. TTE 11/2021 with LVEF 55-60%, normal functioning aortic valve prosthesis with mean gradient 85mHg.   -Continue ASA '81mg'$  daily -Continue plavix '75mg'$  daily  -Continue crestor '40mg'$  daily, zetia '10mg'$  daily -Not on BB due to post-op CHB and episode of junctional arrhythmia in clinic  #Moderate AS: S/p aortic valve replacement with a 23 mm Edwards Resilia Valve at time of CABG as detailed above. TTE 11/2021 with well functioning prosthesis with mean gradient 134mg.  -Continue serial monitoring  #PAD: S/p left common femoral endarterectomy with bovine patch angioplasty and left common femoral to below the knee popliteal artery bypass with PTFE by Dr. ClCarlis Abbottn 06/05/2021. Claudication symptoms improved.  -Continue ASA '81mg'$  daily and plavix '75mg'$  daily -Continue PPI -Continue rosuvastatin '40mg'$  daily   #Carotid artery stenosis s/p L CEA: -Continue rosuvastatin '40mg'$  daily -Continue ASA '81mg'$ , plavix '75mg'$  daily    #HTN: #Concern for orthostasis: Patient with soft blood pressures at home with suspicion for orthostatic symptoms which likely are contributing to fatigue and dizziness with position changes. Stopped HCTZ and amlodipine. Currently, *** -Stopped HCTZ and amlodipine -Continue benzapril '20mg'$  daily -Monitor BP at home   #HLD: LDL 43, TG 59 in 05/2021. -Continue rosuvastatin '40mg'$  daily  #DMII: -Follow-up with Endocrine as scheduled  Follow-up:  3 months.   Medication Adjustments/Labs and Tests Ordered: Current medicines are reviewed at length with the patient today.  Concerns regarding medicines are outlined above.  No orders of the defined types were placed in this encounter.  No orders of the defined types were placed in this encounter.   There are no Patient Instructions on file for this visit.  I,Mathew Stumpf,acting as a scEducation administratoror HeFreada BergeronMD.,have documented all relevant documentation on the behalf of HeFreada BergeronMD,as directed by  HeFreada BergeronMD while in the presence of HeFreada BergeronMD.  I, HeFreada BergeronMD, have reviewed all documentation for this visit. The documentation on 02/22/22 for the exam, diagnosis, procedures, and orders are all accurate and complete.    Signed, HeFreada BergeronMD  02/22/2022 1:58 PM    [place]

## 2022-02-23 ENCOUNTER — Ambulatory Visit (HOSPITAL_COMMUNITY): Payer: HMO

## 2022-02-26 ENCOUNTER — Ambulatory Visit (HOSPITAL_COMMUNITY): Payer: HMO

## 2022-02-27 ENCOUNTER — Encounter (HOSPITAL_COMMUNITY): Payer: Self-pay

## 2022-02-27 ENCOUNTER — Observation Stay (HOSPITAL_COMMUNITY)
Admission: EM | Admit: 2022-02-27 | Discharge: 2022-02-28 | Disposition: A | Discharge status: Home / Self Care | Payer: HMO | Attending: Internal Medicine | Admitting: Internal Medicine

## 2022-02-27 DIAGNOSIS — I2510 Atherosclerotic heart disease of native coronary artery without angina pectoris: Secondary | ICD-10-CM | POA: Insufficient documentation

## 2022-02-27 DIAGNOSIS — Z7982 Long term (current) use of aspirin: Secondary | ICD-10-CM | POA: Insufficient documentation

## 2022-02-27 DIAGNOSIS — Z87891 Personal history of nicotine dependence: Secondary | ICD-10-CM | POA: Diagnosis not present

## 2022-02-27 DIAGNOSIS — E1151 Type 2 diabetes mellitus with diabetic peripheral angiopathy without gangrene: Secondary | ICD-10-CM | POA: Diagnosis not present

## 2022-02-27 DIAGNOSIS — Z952 Presence of prosthetic heart valve: Secondary | ICD-10-CM | POA: Insufficient documentation

## 2022-02-27 DIAGNOSIS — R7989 Other specified abnormal findings of blood chemistry: Secondary | ICD-10-CM | POA: Diagnosis not present

## 2022-02-27 DIAGNOSIS — D649 Anemia, unspecified: Secondary | ICD-10-CM | POA: Diagnosis present

## 2022-02-27 DIAGNOSIS — R262 Difficulty in walking, not elsewhere classified: Secondary | ICD-10-CM | POA: Diagnosis not present

## 2022-02-27 DIAGNOSIS — E876 Hypokalemia: Secondary | ICD-10-CM | POA: Diagnosis not present

## 2022-02-27 DIAGNOSIS — Z8673 Personal history of transient ischemic attack (TIA), and cerebral infarction without residual deficits: Secondary | ICD-10-CM | POA: Insufficient documentation

## 2022-02-27 DIAGNOSIS — Z951 Presence of aortocoronary bypass graft: Secondary | ICD-10-CM | POA: Diagnosis not present

## 2022-02-27 DIAGNOSIS — Z7902 Long term (current) use of antithrombotics/antiplatelets: Secondary | ICD-10-CM | POA: Insufficient documentation

## 2022-02-27 DIAGNOSIS — D509 Iron deficiency anemia, unspecified: Secondary | ICD-10-CM | POA: Diagnosis not present

## 2022-02-27 DIAGNOSIS — E8809 Other disorders of plasma-protein metabolism, not elsewhere classified: Secondary | ICD-10-CM

## 2022-02-27 DIAGNOSIS — R770 Abnormality of albumin: Secondary | ICD-10-CM | POA: Insufficient documentation

## 2022-02-27 DIAGNOSIS — Z79899 Other long term (current) drug therapy: Secondary | ICD-10-CM | POA: Diagnosis not present

## 2022-02-27 DIAGNOSIS — Z794 Long term (current) use of insulin: Secondary | ICD-10-CM | POA: Insufficient documentation

## 2022-02-27 DIAGNOSIS — I10 Essential (primary) hypertension: Secondary | ICD-10-CM | POA: Diagnosis present

## 2022-02-27 DIAGNOSIS — E1142 Type 2 diabetes mellitus with diabetic polyneuropathy: Secondary | ICD-10-CM | POA: Diagnosis present

## 2022-02-27 DIAGNOSIS — Z7984 Long term (current) use of oral hypoglycemic drugs: Secondary | ICD-10-CM | POA: Insufficient documentation

## 2022-02-27 LAB — URINALYSIS, ROUTINE W REFLEX MICROSCOPIC
Bacteria, UA: NONE SEEN
Bilirubin Urine: NEGATIVE
Glucose, UA: NEGATIVE mg/dL
Ketones, ur: NEGATIVE mg/dL
Leukocytes,Ua: NEGATIVE
Nitrite: NEGATIVE
Protein, ur: 100 mg/dL — AB
Specific Gravity, Urine: 1.005 (ref 1.005–1.030)
pH: 7 (ref 5.0–8.0)

## 2022-02-27 LAB — CBC WITH DIFFERENTIAL/PLATELET
Abs Immature Granulocytes: 0.02 10*3/uL (ref 0.00–0.07)
Basophils Absolute: 0 10*3/uL (ref 0.0–0.1)
Basophils Relative: 0 %
Eosinophils Absolute: 0 10*3/uL (ref 0.0–0.5)
Eosinophils Relative: 1 %
HCT: 22.1 % — ABNORMAL LOW (ref 39.0–52.0)
Hemoglobin: 6.2 g/dL — CL (ref 13.0–17.0)
Immature Granulocytes: 0 %
Lymphocytes Relative: 29 %
Lymphs Abs: 1.7 10*3/uL (ref 0.7–4.0)
MCH: 22.5 pg — ABNORMAL LOW (ref 26.0–34.0)
MCHC: 28.1 g/dL — ABNORMAL LOW (ref 30.0–36.0)
MCV: 80.4 fL (ref 80.0–100.0)
Monocytes Absolute: 0.7 10*3/uL (ref 0.1–1.0)
Monocytes Relative: 12 %
Neutro Abs: 3.5 10*3/uL (ref 1.7–7.7)
Neutrophils Relative %: 58 %
Platelets: 318 10*3/uL (ref 150–400)
RBC: 2.75 MIL/uL — ABNORMAL LOW (ref 4.22–5.81)
RDW: 16.5 % — ABNORMAL HIGH (ref 11.5–15.5)
WBC: 5.9 10*3/uL (ref 4.0–10.5)
nRBC: 0 % (ref 0.0–0.2)

## 2022-02-27 LAB — COMPREHENSIVE METABOLIC PANEL
ALT: 46 U/L — ABNORMAL HIGH (ref 0–44)
AST: 47 U/L — ABNORMAL HIGH (ref 15–41)
Albumin: 3.5 g/dL (ref 3.5–5.0)
Alkaline Phosphatase: 74 U/L (ref 38–126)
Anion gap: 5 (ref 5–15)
BUN: 14 mg/dL (ref 8–23)
CO2: 21 mmol/L — ABNORMAL LOW (ref 22–32)
Calcium: 8.8 mg/dL — ABNORMAL LOW (ref 8.9–10.3)
Chloride: 111 mmol/L (ref 98–111)
Creatinine, Ser: 0.9 mg/dL (ref 0.61–1.24)
GFR, Estimated: >60 mL/min (ref >60)
Glucose, Bld: 152 mg/dL — ABNORMAL HIGH (ref 70–99)
Potassium: 3.8 mmol/L (ref 3.5–5.1)
Sodium: 137 mmol/L (ref 135–145)
Total Bilirubin: 0.7 mg/dL (ref 0.3–1.2)
Total Protein: 7.1 g/dL (ref 6.5–8.1)

## 2022-02-27 LAB — RETICULOCYTES
Immature Retic Fract: 38 % — ABNORMAL HIGH (ref 2.3–15.9)
RBC.: 2.9 MIL/uL — ABNORMAL LOW (ref 4.22–5.81)
Retic Count, Absolute: 48.4 10*3/uL (ref 19.0–186.0)
Retic Ct Pct: 1.7 % (ref 0.4–3.1)

## 2022-02-27 LAB — IRON AND TIBC
Iron: 13 ug/dL — ABNORMAL LOW (ref 45–182)
Saturation Ratios: 2 % — ABNORMAL LOW (ref 17.9–39.5)
TIBC: 582 ug/dL — ABNORMAL HIGH (ref 250–450)
UIBC: 569 ug/dL

## 2022-02-27 LAB — POC OCCULT BLOOD, ED: Fecal Occult Bld: NEGATIVE

## 2022-02-27 LAB — CBG MONITORING, ED: Glucose-Capillary: 82 mg/dL (ref 70–99)

## 2022-02-27 LAB — PROTIME-INR
INR: 1.1 (ref 0.8–1.2)
Prothrombin Time: 14 seconds (ref 11.4–15.2)

## 2022-02-27 LAB — FOLATE: Folate: 22.5 ng/mL (ref >5.9)

## 2022-02-27 LAB — VITAMIN B12: Vitamin B-12: 216 pg/mL (ref 180–914)

## 2022-02-27 LAB — FERRITIN: Ferritin: 7 ng/mL — ABNORMAL LOW (ref 24–336)

## 2022-02-27 LAB — PREPARE RBC (CROSSMATCH)

## 2022-02-27 MED ORDER — SODIUM CHLORIDE 0.9% IV SOLUTION: Freq: Once | INTRAVENOUS | Status: AC

## 2022-02-27 MED ORDER — FUROSEMIDE 10 MG/ML IJ SOLN
20.0000 mg | Freq: Once | INTRAMUSCULAR | Status: AC
  Administered 2022-02-27: 20 mg via INTRAVENOUS
  Filled 2022-02-27: qty 4

## 2022-02-27 NOTE — Assessment & Plan Note (Signed)
Continue ASA 81 mg daily, crestor 40 mg daily

## 2022-02-27 NOTE — ED Notes (Signed)
Rate increased to 200 mL/hr ([id number]), no s/s of transfusion reactions, pt reports no complaints or pain, denies CP or SOB.

## 2022-02-27 NOTE — ED Provider Triage Note (Signed)
Emergency Medicine Provider Triage Evaluation Note  Luis Lindsey , a 71 y.o. male  was evaluated in triage.  Pt complains of abnormal lab.  States that he saw his doctor earlier this week for a well visit and had labs done.  He was called today with a hemoglobin of 5.9.  Per patient he has noticed some worsening shortness of breath over the past week.  He has not really been feeling abnormal other than this.  He has not noticed any change in color of his stools, hematuria, no blood in cough or vomit, no recent trauma.  He is on Plavix and aspirin but no other blood thinners.  Review of Systems  Positive:  Negative:   Physical Exam  There were no vitals taken for this visit. Gen:   Awake, no distress   Resp:  Normal effort  MSK:   Moves extremities without difficulty  Other:    Medical Decision Making  Medically screening exam initiated at 6:08 PM.  Appropriate orders placed.  [name] was informed that the remainder of the evaluation will be completed by another provider, this initial triage assessment does not replace that evaluation, and the importance of remaining in the ED until their evaluation is complete.     Adolphus Birchwood, [place] 02/27/22 1809

## 2022-02-27 NOTE — ED Notes (Signed)
Pt given ham sandwich and ginger for snack

## 2022-02-27 NOTE — ED Triage Notes (Signed)
Pt c/o worsening DOE and dizziness for one week. Pt reportedly was at his PCP for routine bloodwork and told to come to the ED for critical Hgb of 5.9. Pt denies any blood thinner use. No hematochezia, melena, bloody emesis per pt. No recent trauma.

## 2022-02-27 NOTE — ED Notes (Signed)
1st Blood transfusion started ([id number]),

## 2022-02-27 NOTE — H&P (Addendum)
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  Had outpatient labs which showed hemoglobin less than 7.  Patient states that he is been feeling more fatigued over the last month.  He denies any hematochezia, melena.  Denies any hematuria.  Denies any blood loss such as cuts.  He states he had a colonoscopy done within the last 12 months by [facility].  States that was normal.  Patient states that he was discharged home after surgery for his bypass with 1 to 2 months of iron and this was subsequently stopped.  Patient is noticed increasing fatigue with walking his dog.  He states that after his surgery in May, he was able to build up and walk his dog around his neighborhood without any difficulty.  Over the last month he has noticed he is not able to walk even around his house without feeling short winded.  He denies any chest pain.  He has dyspnea on exertion.  He has not had any syncope.  Patient not on any systemic anticoagulants.  Patient does eat red meat.  In the ER, temp 90.6 heart rate 82 blood pressure 145/68 satting 100% on room air.  Labs showed a hemoglobin of 6.2 platelets of 318 Hemoccult was negative.  Iron level low at 13, percent saturation of 2  Urinalysis was negative for any red blood cells.  B12 level is normal at 216  Patient typed and crossed for 2 units of blood.  [facility] hospital[facility] contacted for admission.   ED Course: Hgb 6.2, %iron sat 2, iron 17  Review of Systems:  Review of Systems   Constitutional:  Positive for malaise/fatigue.  HENT: Negative.    Eyes: Negative.   Respiratory:  Negative for cough and shortness of breath.        DOE  Cardiovascular:  Positive for leg swelling. Negative for palpitations and orthopnea.  Gastrointestinal: Negative.  Negative for blood in stool and melena.  Genitourinary: Negative.   Musculoskeletal: Negative.   Skin: Negative.   Neurological: Negative.   Endo/Heme/Allergies: Negative.   Psychiatric/Behavioral: Negative.    All other systems reviewed and are negative.   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   Dysphagia    Esophageal dysmotility    GERD (gastroesophageal reflux disease)    Heart murmur    pt had recent echocardiogram   History of hiatal hernia    History of Holter monitoring 2009   History of kidney stones    HNP (herniated nucleus pulposus), lumbar    L4-5   HYPERCHOLESTEROLEMIA 11/30/2009   Hyperlipidemia    HYPERTENSION 11/30/2009   Kidney stones    Numbness and tingling    Peripheral vascular disease ([place])    Right carotid bruit    SMOKER 11/30/2009   Stroke ([place]) 2012  URINARY CALCULUS 11/30/2009    Past Surgical History:  Procedure Laterality Date   ABDOMINAL AORTOGRAM W/LOWER EXTREMITY N/A 06/18/2018   Procedure: ABDOMINAL AORTOGRAM W/LOWER EXTREMITY;  Surgeon: Marty Heck, MD;  Location: [place] [facility];  Service: Cardiovascular;  Laterality: N/A;   ABDOMINAL AORTOGRAM W/LOWER EXTREMITY N/A 12/02/2019   Procedure: ABDOMINAL AORTOGRAM W/LOWER EXTREMITY;  Surgeon: Marty Heck, MD;  Location: [place] [facility];  Service: Cardiovascular;  Laterality: N/A;   ABDOMINAL AORTOGRAM W/LOWER EXTREMITY N/A 03/23/2021   Procedure: ABDOMINAL AORTOGRAM W/LOWER EXTREMITY;  Surgeon: Marty Heck, MD;  Location: [place] [facility];  Service: Cardiovascular;  Laterality: N/A;   AORTIC VALVE REPLACEMENT N/A 10/25/2021   Procedure: AORTIC VALVE REPLACEMENT (AVR)USING 23MM INSPIRIS RESILIA  AORTIC VALVE;  Surgeon: Lajuana Matte, MD;  Location: [place];  Service: Open Heart Surgery;  Laterality: N/A;   arthroscopy of right shoulder Right    Jan 2019, June 2019-- done at [place], Dr. Ninfa Linden   BACK SURGERY     CATARACT EXTRACTION W/ INTRAOCULAR LENS  IMPLANT, BILATERAL     CORONARY ARTERY BYPASS GRAFT N/A 10/25/2021   Procedure: CORONARY ARTERY BYPASS GRAFTING (CABG) X 3 USING LEFT INTERNAL MAMMARY ARTERY AND RIGHT GREATER SAPHENOUS VEIN;  Surgeon: Lajuana Matte, MD;  Location: [place];  Service: Open Heart Surgery;  Laterality: N/A;   CYSTOSCOPY     ENDARTERECTOMY Right 08/24/2019   Procedure: ENDARTERECTOMY CAROTID;  Surgeon: Marty Heck, MD;  Location: [place];  Service: Vascular;  Laterality: Right;   ENDARTERECTOMY FEMORAL Left 06/05/2021   Procedure: ENDARTERECTOMY FEMORAL WITH PROFUNDAPLASTY;  Surgeon: Marty Heck, MD;  Location: [place];  Service: Vascular;  Laterality: Left;   ENDOVEIN HARVEST OF GREATER SAPHENOUS VEIN Right 10/25/2021   Procedure: ENDOVEIN HARVEST OF GREATER SAPHENOUS VEIN;  Surgeon: Lajuana Matte, MD;  Location: [place];  Service: Open Heart Surgery;  Laterality: Right;   FACIAL COSMETIC SURGERY  2014   FEMORAL-POPLITEAL BYPASS GRAFT Left 06/05/2021   Procedure: LEFT FEMORAL-POPLITEAL ARTERY BYPASS GRAFTING USING 78m PROPATEN VASCULAR REMOVABLE RING GRAFT;  Surgeon: CMarty Heck MD;  [place]  Service: Vascular;  Laterality: Left;   FINGER SURGERY  2002   LEFT HEART CATH AND CORONARY ANGIOGRAPHY N/A 10/05/2021   Procedure: LEFT HEART CATH AND CORONARY ANGIOGRAPHY;  Surgeon: ENelva Bush MD;  [place];  Service: Cardiovascular;  Laterality: N/A;   LUMBAR LAMINECTOMY/DECOMPRESSION  MICRODISCECTOMY Left 08/01/2018   Procedure: Microdiscectomy - Lumbar four-Lumbar five - left;  Surgeon: JEustace Moore MD;  [place]  Service: Neurosurgery;  Laterality: Left;   LUMBAR LAMINECTOMY/DECOMPRESSION MICRODISCECTOMY Left 10/15/2018   Procedure: Re-do Microdiscectomy - left - Lumbar four-Lumbar five;  Surgeon: JEustace Moore MD;  [place]  Service: Neurosurgery;  Laterality: Left;   NECK SURGERY  1986   PATCH ANGIOPLASTY Left 06/05/2021   Procedure: PATCH ANGIOPLASTY USING XRueben BashBIOLOGIC PATCH;  Surgeon: CMarty Heck MD;  [place]  Service: Vascular;  Laterality: Left;   PERIPHERAL VASCULAR ATHERECTOMY  12/02/2019   Procedure: PERIPHERAL VASCULAR ATHERECTOMY;  Surgeon: CMarty Heck MD;  [place];  Service: Cardiovascular;;  left SFA   PERIPHERAL VASCULAR INTERVENTION  06/18/2018   Procedure: PERIPHERAL VASCULAR INTERVENTION;  Surgeon: CMarty Heck MD;  [place] [facility];  Service: Cardiovascular;;  Left external iliac   ROTATOR CUFF REPAIR Left    TEE WITHOUT  CARDIOVERSION N/A 10/25/2021   Procedure: TRANSESOPHAGEAL ECHOCARDIOGRAM (TEE);  Surgeon: Lajuana Matte, MD;  Location: [place];  Service: Open Heart Surgery;  Laterality: N/A;   UMBILICAL HERNIA REPAIR       reports that he quit smoking about 6 years ago. His smoking use included cigarettes. He has been exposed to tobacco smoke. He has never used smokeless tobacco. He reports that he does not drink alcohol and does not use drugs.  Allergies  Allergen Reactions   Codeine Nausea Only    Family History  Problem Relation Age of Onset   Diabetes Sister    Diabetes Mellitus I Sister    Stroke Father    Heart disease Father    CVA Father    Coronary artery disease Father    Diabetes Mother    Heart disease Mother    Diabetes Mellitus I Mother    Coronary artery disease Mother    Coronary artery disease Brother    Heart disease Brother     Coronary artery disease Brother     Prior to Admission medications   Medication Sig Start Date End Date Taking? Authorizing Provider  aspirin EC 81 MG EC tablet Take 1 tablet (81 mg total) by mouth daily at 6 (six) AM. 08/26/19   Dagoberto Ligas, PA-C  benazepril (LOTENSIN) 20 MG tablet Take 1 tablet (20 mg total) by mouth daily. 11/27/21   Freada Bergeron, MD  clopidogrel (PLAVIX) 75 MG tablet TAKE 1 TABLET BY MOUTH EVERY DAY 10/03/21   Waynetta Sandy, MD  Continuous Blood Gluc Sensor (FREESTYLE LIBRE 2 SENSOR) MISC AS DIRECTED FOR BLOOD SUGAR CHECK DX E11.42 12/05/20   [provider]  ezetimibe (ZETIA) 10 MG tablet Take 1 tablet (10 mg total) by mouth daily. 06/07/21   Dagoberto Ligas, PA-C  insulin degludec (TRESIBA) 200 UNIT/ML FlexTouch Pen Inject 34 Units into the skin in the morning. 10/31/21   Gold, Wilder Glade, PA-C  metFORMIN (GLUCOPHAGE-XR) 500 MG 24 hr tablet Take 2 tablets (1,000 mg total) by mouth 2 (two) times daily. 10/08/21   End, Harrell Gave, MD  [facility] VERIO test strip 1 each 3 (three) times daily. 10/29/19   [provider]  pantoprazole (PROTONIX) 40 MG tablet Take 40 mg by mouth daily with breakfast.  07/02/18   [provider]  pioglitazone (ACTOS) 45 MG tablet TAKE 1 TABLET DAILY (NEED APPOINTMENT FOR FURTHER REFILLS) 04/28/14   Renato Shin, MD  rosuvastatin (CRESTOR) 40 MG tablet Take 40 mg by mouth daily.     [provider]    Physical Exam: Vitals:   02/27/22 2125 02/27/22 2130 02/27/22 2145 02/27/22 2200  BP: (!) 156/55 (!) 153/51 (!) 165/61 (!) 156/61  Pulse: 70 67 86 70  Resp: (!) '21 14 19 '$ (!) 22  Temp: 98.2 F (36.8 C)     TempSrc: Oral     SpO2: 99% 100% 100% 100%  Weight:      Height:        Physical Exam Vitals and nursing note reviewed.  Constitutional:      General: He is not in acute distress.    Appearance: He is normal weight. He is not ill-appearing, toxic-appearing or diaphoretic.  HENT:     Head:  Normocephalic and atraumatic.     Nose: Nose normal.  Eyes:     General: No scleral icterus. Cardiovascular:     Rate and Rhythm: Normal rate.     Pulses: Normal pulses.  Pulmonary:  Effort: Pulmonary effort is normal. No respiratory distress.     Breath sounds: Normal breath sounds. No wheezing or rales.  Abdominal:     General: Abdomen is flat. Bowel sounds are normal. There is no distension.     Tenderness: There is no abdominal tenderness. There is no guarding.  Musculoskeletal:     Comments: Trace pretibial edema bilaterally  Skin:    General: Skin is warm and dry.     Capillary Refill: Capillary refill takes less than 2 seconds.  Neurological:     General: No focal deficit present.     Mental Status: He is alert and oriented to person, place, and time.      Labs on Admission: I have personally reviewed following labs and imaging studies  CBC: Recent Labs  Lab 02/27/22 1819  WBC 5.9  NEUTROABS 3.5  HGB 6.2*  HCT 22.1*  MCV 80.4  PLT 546   Basic Metabolic Panel: Recent Labs  Lab 02/27/22 1819  NA 137  K 3.8  CL 111  CO2 21*  GLUCOSE 152*  BUN 14  CREATININE 0.90  CALCIUM 8.8*   GFR: Estimated Creatinine Clearance: 77.9 mL/min (by C-G formula based on SCr of 0.9 mg/dL). Liver Function Tests: Recent Labs  Lab 02/27/22 1819  AST 47*  ALT 46*  ALKPHOS 74  BILITOT 0.7  PROT 7.1  ALBUMIN 3.5   No results for input(s): "LIPASE", "AMYLASE" in the last 168 hours. No results for input(s): "AMMONIA" in the last 168 hours. Coagulation Profile: Recent Labs  Lab 02/27/22 1819  INR 1.1   Cardiac Enzymes: No results for input(s): "CKTOTAL", "CKMB", "CKMBINDEX", "TROPONINI", "TROPONINIHS" in the last 168 hours. BNP (last 3 results) No results for input(s): "PROBNP" in the last 8760 hours. HbA1C: No results for input(s): "HGBA1C" in the last 72 hours. CBG: Recent Labs  Lab 02/27/22 2050  GLUCAP 82   Lipid Profile: No results for input(s):  "CHOL", "HDL", "LDLCALC", "TRIG", "CHOLHDL", "LDLDIRECT" in the last 72 hours. Thyroid Function Tests: No results for input(s): "TSH", "T4TOTAL", "FREET4", "T3FREE", "THYROIDAB" in the last 72 hours. Anemia Panel: Recent Labs    02/27/22 2042  VITAMINB12 216  FERRITIN 7*  TIBC 582*  IRON 13*  RETICCTPCT 1.7   Urine analysis:    Component Value Date/Time   COLORURINE STRAW (A) 02/27/2022 2150   APPEARANCEUR CLEAR 02/27/2022 2150   LABSPEC 1.005 02/27/2022 2150   PHURINE 7.0 02/27/2022 2150   GLUCOSEU NEGATIVE 02/27/2022 2150   HGBUR MODERATE (A) 02/27/2022 2150   BILIRUBINUR NEGATIVE 02/27/2022 2150   KETONESUR NEGATIVE 02/27/2022 2150   PROTEINUR 100 (A) 02/27/2022 2150   NITRITE NEGATIVE 02/27/2022 2150   LEUKOCYTESUR NEGATIVE 02/27/2022 2150    Radiological Exams on Admission: I have personally reviewed images No results found.  EKG: My personal interpretation of EKG shows: no EKG  Assessment/Plan Principal Problem:   Symptomatic anemia Active Problems:   Iron deficiency anemia   Essential hypertension   DM type 2 with diabetic peripheral neuropathy (HCC)   S/P CABG x 3    Assessment and Plan: * Symptomatic anemia Admit to observation med/surg bed. Transfuse 2 units PRBC. IV lasix 20 mg after 1st unit of PRBC. Repeat CBC in AM. Pt denies any melena, BRBPR. No hematuria. Has had colonoscopy by [facility] in the past 12 months. Pt states this was negative. occ dyspepsia. Has not had EGD before. Recommend outpatient GI referral for EGD. DC pt to home with 3 months of oral  iron(either Nu-iron 150 mg daily vs FeSO4 325 mg daily). Repeat CBC in 1-2 months. If not improved and GI workup negative, pt may need IV iron infusions. Also check PSA as outpatient.  Iron deficiency anemia See above. DC to home with oral iron.  S/P CABG x 3 Continue ASA 81 mg daily, crestor 40 mg daily  DM type 2 with diabetic peripheral neuropathy (HCC) Stable. On tresiba and metformin at home.  Check A1c. Add SSI during hospitalizaiton.  Essential hypertension Stable. Continue lotensin 20 mg dailiy.   DVT prophylaxis: SCDs Code Status: Full Code Family Communication: no family at bedside  Disposition Plan: return home  Consults called: none  Admission status: Observation, Med-Surg   Kristopher Oppenheim, DO [facility] 02/27/2022, 10:21 PM

## 2022-02-27 NOTE — Subjective & Objective (Signed)
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  Had outpatient labs which showed hemoglobin less than 7.  Patient states that he is been feeling more fatigued over the last month.  He denies any hematochezia, melena.  Denies any hematuria.  Denies any blood loss such as cuts.  He states he had a colonoscopy done within the last 12 months by [facility].  States that was normal.  Patient states that he was discharged home after surgery for his bypass with 1 to 2 months of iron and this was subsequently stopped.  Patient is noticed increasing fatigue with walking his dog.  He states that after his surgery in May, he was able to build up and walk his dog around his neighborhood without any difficulty.  Over the last month he has noticed he is not able to walk even around his house without feeling short winded.  He denies any chest pain.  He has dyspnea on exertion.  He has not had any syncope.  Patient not on any systemic anticoagulants.  Patient does eat red meat.  In the ER, temp 90.6 heart rate 82 blood pressure 145/68 satting 100% on room air.  Labs showed a hemoglobin of 6.2 platelets of 318 Hemoccult was negative.  Iron level low at 13, percent saturation of 2  Urinalysis was negative for any red blood cells.  B12 level is normal at 216  Patient typed and crossed for 2 units of blood.  [facility] hospitalist contacted for admission.

## 2022-02-27 NOTE — Assessment & Plan Note (Addendum)
Admit to observation med/surg bed. Transfuse 2 units PRBC. IV lasix 20 mg after 1st unit of PRBC. Repeat CBC in AM. Pt denies any melena, BRBPR. No hematuria. Has had colonoscopy by [facility] in the past 12 months. Pt states this was negative. occ dyspepsia. Has not had EGD before. Recommend outpatient GI referral for EGD. DC pt to home with 3 months of oral iron(either Nu-iron 150 mg daily vs FeSO4 325 mg daily). Repeat CBC in 1-2 months. If not improved and GI workup negative, pt may need IV iron infusions. Also check PSA as outpatient.

## 2022-02-27 NOTE — Assessment & Plan Note (Signed)
See above. DC to home with oral iron.

## 2022-02-27 NOTE — Assessment & Plan Note (Signed)
Stable. On tresiba and metformin at home. Check A1c. Add SSI during hospitalizaiton.

## 2022-02-27 NOTE — ED Notes (Signed)
2nd blood transfusion started ([id number])

## 2022-02-27 NOTE — ED Notes (Signed)
Rate increased to 200 mL/hr ([id number]), no s/s of transfusion reactions, pt reports no complaints or pain, denies CP or SOB.

## 2022-02-27 NOTE — ED Notes (Signed)
1st Blood transfusion completed ([id number]), no s/s of transfusion reactions, pt reports no complaints or pain, denies CP or SOB.

## 2022-02-27 NOTE — ED Provider Notes (Signed)
[place] DEPT Provider Note   CSN: [id number] Arrival date & time: 02/27/22  1802     History  Chief Complaint  Patient presents with   Abnormal Lab   Symptomatic Anemia    Luis Lindsey is a 71 y.o. male.  Patient is a 71 year old male with a past medical history of CAD and aortic stenosis status post CABG and aortic valve replacement in June on Plavix, hypertension and diabetes presenting to the emergency department with abnormal labs.  The patient states that he had labs performed by his primary doctor and was called today to tell him that he is hemoglobin was very low and that he needed to come to the emergency department.  The patient states over the last few weeks he has had increasing generalized weakness and dyspnea on exertion.  He denies any chest pain.  He denies any black or bloody stools.  He states he always has some bruising on his arms but otherwise denies any other bruising or abnormal bleeding.  He states that he had a colonoscopy within the last year that had 1 polyp.  The history is provided by the patient.  Abnormal Lab      Home Medications Prior to Admission medications   Medication Sig Start Date End Date Taking? Authorizing Provider  aspirin EC 81 MG EC tablet Take 1 tablet (81 mg total) by mouth daily at 6 (six) AM. 08/26/19   Dagoberto Ligas, PA-C  benazepril (LOTENSIN) 20 MG tablet Take 1 tablet (20 mg total) by mouth daily. 11/27/21   Freada Bergeron, MD  clopidogrel (PLAVIX) 75 MG tablet TAKE 1 TABLET BY MOUTH EVERY DAY 10/03/21   Waynetta Sandy, MD  Continuous Blood Gluc Sensor (FREESTYLE LIBRE 2 SENSOR) MISC AS DIRECTED FOR BLOOD SUGAR CHECK DX E11.42 12/05/20   [provider]  ezetimibe (ZETIA) 10 MG tablet Take 1 tablet (10 mg total) by mouth daily. 06/07/21   Dagoberto Ligas, PA-C  insulin degludec (TRESIBA) 200 UNIT/ML FlexTouch Pen Inject 34 Units into the skin in the morning. 10/31/21   Gold,  Wilder Glade, PA-C  metFORMIN (GLUCOPHAGE-XR) 500 MG 24 hr tablet Take 2 tablets (1,000 mg total) by mouth 2 (two) times daily. 10/08/21   End, Harrell Gave, MD  [facility] VERIO test strip 1 each 3 (three) times daily. 10/29/19   [provider]  pantoprazole (PROTONIX) 40 MG tablet Take 40 mg by mouth daily with breakfast.  07/02/18   [provider]  pioglitazone (ACTOS) 45 MG tablet TAKE 1 TABLET DAILY (NEED APPOINTMENT FOR FURTHER REFILLS) 04/28/14   Renato Shin, MD  rosuvastatin (CRESTOR) 40 MG tablet Take 40 mg by mouth daily.     [provider]      Allergies    Codeine    Review of Systems   Review of Systems  Physical Exam Updated Vital Signs BP (!) 156/55   Pulse 70   Temp 98.2 F (36.8 C) (Oral)   Resp (!) 21   Ht '5\' 10"'$  (1.778 m)   Wt 72.1 kg   SpO2 99%   BMI 22.81 kg/m  Physical Exam Vitals and nursing note reviewed. Exam conducted with a chaperone present.  Constitutional:      General: He is not in acute distress.    Appearance: Normal appearance.  HENT:     Head: Normocephalic and atraumatic.     Nose: Nose normal.     Mouth/Throat:     Mouth: Mucous membranes are  moist.     Pharynx: Oropharynx is clear.  Eyes:     Extraocular Movements: Extraocular movements intact.     Pupils: Pupils are equal, round, and reactive to light.     Comments: Pale conjunctive a  Cardiovascular:     Rate and Rhythm: Normal rate and regular rhythm.     Pulses: Normal pulses.     Heart sounds: Normal heart sounds.  Pulmonary:     Effort: Pulmonary effort is normal.     Breath sounds: Normal breath sounds.  Abdominal:     General: Abdomen is flat.     Palpations: Abdomen is soft.     Tenderness: There is no abdominal tenderness.  Genitourinary:    Rectum: Guaiac result negative (Brown stool).  Musculoskeletal:        General: Normal range of motion.     Cervical back: Normal range of motion and neck supple.     Right lower leg: No edema.     Left  lower leg: No edema.  Skin:    General: Skin is warm and dry.  Neurological:     General: No focal deficit present.     Mental Status: He is alert and oriented to person, place, and time.     ED Results / Procedures / Treatments   Labs (all labs ordered are listed, but only abnormal results are displayed) Labs Reviewed  COMPREHENSIVE METABOLIC PANEL - Abnormal; Notable for the following components:      Result Value   CO2 21 (*)    Glucose, Bld 152 (*)    Calcium 8.8 (*)    AST 47 (*)    ALT 46 (*)    All other components within normal limits  CBC WITH DIFFERENTIAL/PLATELET - Abnormal; Notable for the following components:   RBC 2.75 (*)    Hemoglobin 6.2 (*)    HCT 22.1 (*)    MCH 22.5 (*)    MCHC 28.1 (*)    RDW 16.5 (*)    All other components within normal limits  RETICULOCYTES - Abnormal; Notable for the following components:   RBC. 2.90 (*)    Immature Retic Fract 38.0 (*)    All other components within normal limits  PROTIME-INR  URINALYSIS, ROUTINE W REFLEX MICROSCOPIC  VITAMIN B12  FOLATE  IRON AND TIBC  FERRITIN  POC OCCULT BLOOD, ED  CBG MONITORING, ED  TYPE AND SCREEN  PREPARE RBC (CROSSMATCH)    EKG None  Radiology No results found.  Procedures .Critical Care  Performed by: Kemper Durie, DO Authorized by: Kemper Durie, DO   Critical care provider statement:    Critical care time (minutes):  35   Critical care time was exclusive of:  Separately billable procedures and treating other patients   Critical care was necessary to treat or prevent imminent or life-threatening deterioration of the following conditions:  Circulatory failure   Critical care was time spent personally by me on the following activities:  Development of treatment plan with patient or surrogate, discussions with primary provider, evaluation of patient's response to treatment, examination of patient, obtaining history from patient or surrogate, ordering and  performing treatments and interventions, ordering and review of laboratory studies, pulse oximetry, re-evaluation of patient's condition and review of old charts   I assumed direction of critical care for this patient from another provider in my specialty: no     Care discussed with: admitting provider       Medications Ordered in  ED Medications  0.9 %  sodium chloride infusion (Manually program via Guardrails IV Fluids) (0 mLs Intravenous Hold 02/27/22 2044)    ED Course/ Medical Decision Making/ A&P Clinical Course as of 02/27/22 2207  Tue Feb 27, 2022  2149 Patient signed out to Dr. Bridgett Larsson hospitalist. [VK]    Clinical Course User Index [VK] Kemper Durie, DO                           Medical Decision Making This patient presents to the ED with chief complaint(s) of anemia with pertinent past medical history of CAD status post CABG and aortic valve replacement on Plavix, hypertension and diabetes which further complicates the presenting complaint. The complaint involves an extensive differential diagnosis and also carries with it a high risk of complications and morbidity.    The differential diagnosis includes symptomatic anemia, GI bleed, coagulopathy, patient is not having any pain and no recent trauma making hemorrhage as a cause of his Mia less likely  Additional history obtained: Additional history obtained from N/A Records reviewed previous admission documents  ED Course and Reassessment: Patient was initially evaluated by provider in triage and had labs performed including a repeat CBC here and his hemoglobin was 6.2.  Hemoccult was performed which showed brown stool and was Hemoccult negative.  He has no obvious source of bleeding.  Reviewing his chart he was anemic in June postop thought to be secondary to acute blood loss anemia from his recent CABG and aortic valve replacement and his hemoglobin had improved postop with the drop in hemoglobin again now.  Patient was  consented for blood and will be transfused 2 units of PRBCs in the history of his CAD and he will require admission for further work-up of his symptomatic anemia.  Independent labs interpretation:  The following labs were independently interpreted: Hgb 6.2  Independent visualization of imaging: N/A  Consultation: - Consulted or discussed management/test interpretation w/ external professional: Hospitalist  Consideration for admission or further workup: Patient requires admission for further work-up and evaluation of his new onset anemia Social Determinants of health: N/A    Amount and/or Complexity of Data Reviewed Labs: ordered.  Risk Prescription drug management. Decision regarding hospitalization.          Final Clinical Impression(s) / ED Diagnoses Final diagnoses:  Symptomatic anemia    Rx / DC Orders ED Discharge Orders     None         Kemper Durie, DO 02/27/22 2207

## 2022-02-27 NOTE — Assessment & Plan Note (Signed)
Stable. Continue lotensin 20 mg dailiy.

## 2022-02-28 ENCOUNTER — Ambulatory Visit: Payer: HMO | Admitting: Cardiology

## 2022-02-28 ENCOUNTER — Ambulatory Visit (HOSPITAL_COMMUNITY): Payer: HMO

## 2022-02-28 DIAGNOSIS — D509 Iron deficiency anemia, unspecified: Secondary | ICD-10-CM | POA: Diagnosis not present

## 2022-02-28 DIAGNOSIS — E8809 Other disorders of plasma-protein metabolism, not elsewhere classified: Secondary | ICD-10-CM | POA: Diagnosis not present

## 2022-02-28 DIAGNOSIS — D649 Anemia, unspecified: Secondary | ICD-10-CM | POA: Diagnosis not present

## 2022-02-28 DIAGNOSIS — E1142 Type 2 diabetes mellitus with diabetic polyneuropathy: Secondary | ICD-10-CM | POA: Diagnosis not present

## 2022-02-28 DIAGNOSIS — E876 Hypokalemia: Secondary | ICD-10-CM | POA: Diagnosis not present

## 2022-02-28 DIAGNOSIS — I10 Essential (primary) hypertension: Secondary | ICD-10-CM | POA: Diagnosis not present

## 2022-02-28 DIAGNOSIS — Z951 Presence of aortocoronary bypass graft: Secondary | ICD-10-CM | POA: Diagnosis not present

## 2022-02-28 LAB — BPAM RBC
Blood Product Expiration Date: 202311072359
Blood Product Expiration Date: 202311082359
ISSUE DATE / TIME: 202310032100
ISSUE DATE / TIME: 202310032333
Unit Type and Rh: 5100
Unit Type and Rh: 5100

## 2022-02-28 LAB — CBC WITH DIFFERENTIAL/PLATELET
Abs Immature Granulocytes: 0.01 10*3/uL (ref 0.00–0.07)
Basophils Absolute: 0 10*3/uL (ref 0.0–0.1)
Basophils Relative: 0 %
Eosinophils Absolute: 0.1 10*3/uL (ref 0.0–0.5)
Eosinophils Relative: 1 %
HCT: 30.5 % — ABNORMAL LOW (ref 39.0–52.0)
Hemoglobin: 9.2 g/dL — ABNORMAL LOW (ref 13.0–17.0)
Immature Granulocytes: 0 %
Lymphocytes Relative: 24 %
Lymphs Abs: 1.7 10*3/uL (ref 0.7–4.0)
MCH: 24.6 pg — ABNORMAL LOW (ref 26.0–34.0)
MCHC: 30.2 g/dL (ref 30.0–36.0)
MCV: 81.6 fL (ref 80.0–100.0)
Monocytes Absolute: 0.6 10*3/uL (ref 0.1–1.0)
Monocytes Relative: 9 %
Neutro Abs: 4.7 10*3/uL (ref 1.7–7.7)
Neutrophils Relative %: 66 %
Platelets: 295 10*3/uL (ref 150–400)
RBC: 3.74 MIL/uL — ABNORMAL LOW (ref 4.22–5.81)
RDW: 16.8 % — ABNORMAL HIGH (ref 11.5–15.5)
WBC: 7.1 10*3/uL (ref 4.0–10.5)
nRBC: 0 % (ref 0.0–0.2)

## 2022-02-28 LAB — GLUCOSE, CAPILLARY
Glucose-Capillary: 46 mg/dL — ABNORMAL LOW (ref 70–99)
Glucose-Capillary: 54 mg/dL — ABNORMAL LOW (ref 70–99)
Glucose-Capillary: 81 mg/dL (ref 70–99)
Glucose-Capillary: 249 mg/dL — ABNORMAL HIGH (ref 70–99)

## 2022-02-28 LAB — COMPREHENSIVE METABOLIC PANEL
ALT: 39 U/L (ref 0–44)
AST: 32 U/L (ref 15–41)
Albumin: 3.2 g/dL — ABNORMAL LOW (ref 3.5–5.0)
Alkaline Phosphatase: 67 U/L (ref 38–126)
Anion gap: 6 (ref 5–15)
BUN: 10 mg/dL (ref 8–23)
CO2: 24 mmol/L (ref 22–32)
Calcium: 8.7 mg/dL — ABNORMAL LOW (ref 8.9–10.3)
Chloride: 109 mmol/L (ref 98–111)
Creatinine, Ser: 0.84 mg/dL (ref 0.61–1.24)
GFR, Estimated: >60 mL/min (ref >60)
Glucose, Bld: 60 mg/dL — ABNORMAL LOW (ref 70–99)
Potassium: 2.8 mmol/L — ABNORMAL LOW (ref 3.5–5.1)
Sodium: 139 mmol/L (ref 135–145)
Total Bilirubin: 0.6 mg/dL (ref 0.3–1.2)
Total Protein: 7 g/dL (ref 6.5–8.1)

## 2022-02-28 LAB — TYPE AND SCREEN
ABO/RH(D): O POS
Antibody Screen: NEGATIVE
Unit division: 0
Unit division: 0

## 2022-02-28 LAB — PHOSPHORUS: Phosphorus: 3.1 mg/dL (ref 2.5–4.6)

## 2022-02-28 LAB — MAGNESIUM: Magnesium: 2.1 mg/dL (ref 1.7–2.4)

## 2022-02-28 LAB — HIV ANTIBODY (ROUTINE TESTING W REFLEX): HIV Screen 4th Generation wRfx: NONREACTIVE

## 2022-02-28 MED ORDER — BENAZEPRIL HCL 20 MG PO TABS
20.0000 mg | ORAL_TABLET | Freq: Every day | ORAL | Status: DC
  Administered 2022-02-28: 20 mg via ORAL
  Filled 2022-02-28: qty 1

## 2022-02-28 MED ORDER — ACETAMINOPHEN 325 MG PO TABS: 650.0000 mg | ORAL_TABLET | Freq: Four times a day (QID) | ORAL | Status: DC | PRN

## 2022-02-28 MED ORDER — INSULIN ASPART 100 UNIT/ML IJ SOLN
0.0000 [IU] | Freq: Three times a day (TID) | INTRAMUSCULAR | Status: DC
  Administered 2022-02-28: 5 [IU] via SUBCUTANEOUS

## 2022-02-28 MED ORDER — PANTOPRAZOLE SODIUM 40 MG PO TBEC
40.0000 mg | DELAYED_RELEASE_TABLET | Freq: Every day | ORAL | Status: DC
  Administered 2022-02-28: 40 mg via ORAL
  Filled 2022-02-28: qty 1

## 2022-02-28 MED ORDER — ROSUVASTATIN CALCIUM 20 MG PO TABS
40.0000 mg | ORAL_TABLET | Freq: Every day | ORAL | Status: DC
  Administered 2022-02-28: 40 mg via ORAL
  Filled 2022-02-28: qty 2

## 2022-02-28 MED ORDER — EZETIMIBE 10 MG PO TABS
10.0000 mg | ORAL_TABLET | Freq: Every day | ORAL | Status: DC
  Administered 2022-02-28: 10 mg via ORAL
  Filled 2022-02-28: qty 1

## 2022-02-28 MED ORDER — ACETAMINOPHEN 650 MG RE SUPP: 650.0000 mg | Freq: Four times a day (QID) | RECTAL | Status: DC | PRN

## 2022-02-28 MED ORDER — ASPIRIN 81 MG PO TBEC
81.0000 mg | DELAYED_RELEASE_TABLET | Freq: Every day | ORAL | Status: DC
  Administered 2022-02-28: 81 mg via ORAL
  Filled 2022-02-28: qty 1

## 2022-02-28 MED ORDER — INSULIN ASPART 100 UNIT/ML IJ SOLN: 0.0000 [IU] | Freq: Every day | INTRAMUSCULAR | Status: DC

## 2022-02-28 MED ORDER — POLYSACCHARIDE IRON COMPLEX 150 MG PO CAPS: 150.0000 mg | ORAL_CAPSULE | Freq: Every day | ORAL | 0 refills | Status: AC

## 2022-02-28 MED ORDER — POLYSACCHARIDE IRON COMPLEX 150 MG PO CAPS
150.0000 mg | ORAL_CAPSULE | Freq: Every day | ORAL | Status: DC
  Administered 2022-02-28: 150 mg via ORAL
  Filled 2022-02-28: qty 1

## 2022-02-28 MED ORDER — GLUCOSE 4 G PO CHEW
CHEWABLE_TABLET | ORAL | Status: AC
  Filled 2022-02-28: qty 1

## 2022-02-28 MED ORDER — ONDANSETRON HCL 4 MG PO TABS: 4.0000 mg | ORAL_TABLET | Freq: Four times a day (QID) | ORAL | 0 refills | Status: DC | PRN

## 2022-02-28 MED ORDER — ONDANSETRON HCL 4 MG/2ML IJ SOLN: 4.0000 mg | Freq: Four times a day (QID) | INTRAMUSCULAR | Status: DC | PRN

## 2022-02-28 MED ORDER — ONDANSETRON HCL 4 MG PO TABS: 4.0000 mg | ORAL_TABLET | Freq: Four times a day (QID) | ORAL | Status: DC | PRN

## 2022-02-28 NOTE — Progress Notes (Addendum)
CBG 46 on 10/4 Treatment 6oz orange juice  CBG retake 52 @ 8:15 Treatment = one Gluclose tablet at 0825  CBG retake at 0845 =81  Pt is not symptomatic and does have his breakfast ordered.

## 2022-02-28 NOTE — TOC Initial Note (Signed)
Transition of Care [facility]) - Initial/Assessment Note    Patient Details  Name: Luis Lindsey MRN: [id number] Date of Birth: 06-Jun-1950  Transition of Care [facility]) CM/SW Contact:    Leeroy Cha, RN Phone Number: 02/28/2022, 7:59 AM  Clinical Narrative:                  Transition of Care [facility]) Screening Note   Patient Details  Name: Luis Lindsey Date of Birth: 1951/05/04   Transition of Care [facility]) CM/SW Contact:    Leeroy Cha, RN Phone Number: 02/28/2022, 8:00 AM    Transition of Care Department [facility]) has reviewed patient and no TOC needs have been identified at this time. We will continue to monitor patient advancement through interdisciplinary progression rounds. If new patient transition needs arise, please place a TOC consult.    Expected Discharge Plan: Home/Self Care Barriers to Discharge: Continued Medical Work up   Patient Goals and CMS Choice Patient states their goals for this hospitalization and ongoing recovery are:: to go home CMS Medicare.gov Compare Post Acute Care list provided to:: Patient    Expected Discharge Plan and Services Expected Discharge Plan: Home/Self Care   Discharge Planning Services: CM Consult   Living arrangements for the past 2 months: Single Family Home                                      Prior Living Arrangements/Services Living arrangements for the past 2 months: Single Family Home Lives with:: Spouse Patient language and need for interpreter reviewed:: Yes Do you feel safe going back to the place where you live?: Yes            Criminal Activity/Legal Involvement Pertinent to Current Situation/Hospitalization: No - Comment as needed  Activities of Daily Living Home Assistive Devices/Equipment: Cane (specify quad or straight) ADL Screening (condition at time of admission) Patient's cognitive ability adequate to safely complete daily activities?: Yes Is the patient deaf or have difficulty hearing?:  No Does the patient have difficulty seeing, even when wearing glasses/contacts?: No Does the patient have difficulty concentrating, remembering, or making decisions?: No Patient able to express need for assistance with ADLs?: Yes Does the patient have difficulty dressing or bathing?: No Independently performs ADLs?: Yes (appropriate for developmental age) Does the patient have difficulty walking or climbing stairs?: Yes Weakness of Legs: Left Weakness of Arms/Hands: None  Permission Sought/Granted                  Emotional Assessment Appearance:: Appears stated age Attitude/Demeanor/Rapport: Engaged Affect (typically observed): Calm Orientation: : Oriented to Self, Oriented to Place, Oriented to  Time, Oriented to Situation Alcohol / Substance Use: Tobacco Use (quit 7 years ago) Psych Involvement: No (comment)  Admission diagnosis:  Symptomatic anemia [D64.9] Patient Active Problem List   Diagnosis Date Noted   Symptomatic anemia 02/27/2022   S/P CABG x 3 02/27/2022   Iron deficiency anemia 40/98/1191   Complication of surgical procedure 02/13/2022   Degeneration of lumbar intervertebral disc 02/13/2022   S/P AVR (aortic valve replacement) 10/25/2021   Coronary artery disease of native artery of native heart with stable angina pectoris (HCC)    Abnormal cardiac CT angiography    PAD (peripheral artery disease) ([place]) 06/05/2021   S/P lumbar fusion 12/07/2020   Peripheral vascular disease ([place]) 04/06/2020   Cerebrovascular accident (CVA) ([place]) 04/06/2020   S/P carotid  endarterectomy 04/06/2020   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/31/2019   Gait abnormality 12/31/2019   Tremor 12/07/2019   Carotid stenosis, asymptomatic, right 08/24/2019   Carotid disease, bilateral ([place]) 08/11/2019   S/P lumbar laminectomy 08/01/2018   PVD (peripheral vascular disease) ([place]) 06/10/2018   Left lumbar radiculopathy 04/01/2018   Peripheral neuropathy 04/01/2018    Left leg claudication ([place]) 04/01/2018   Status post arthroscopy of right shoulder 06/27/2017   Superior glenoid labrum lesion of right shoulder 06/10/2017   Chronic right shoulder pain 04/16/2017   Displaced fracture of proximal phalanx of right great toe with routine healing 03/13/2017   Impingement syndrome of right shoulder 03/13/2017   Displaced fracture of proximal phalanx of right great toe, initial encounter for closed fracture 02/20/2017   Diabetes ([place]) 08/24/2013   History of Holter monitoring    Heart murmur    TIA (transient ischemic attack) 12/26/2012   Kidney stones    DM 11/30/2009   HYPERCHOLESTEROLEMIA 11/30/2009   Essential hypertension 11/30/2009   URINARY CALCULUS 11/30/2009   CAROTID BRUIT, RIGHT 11/30/2009   PCP:  Alroy Dust, [name], MD Pharmacy:   [id number] [place] [place] [address]Phone: [phone]: [phone]    Social Determinants of Health (SDOH) Interventions    Readmission Risk Interventions   No data to display

## 2022-02-28 NOTE — ED Notes (Signed)
Receiving RN Marya Landry has agreed to accept [facility] once pt has arrived to inpatient unit, all questions and concerns address.

## 2022-02-28 NOTE — Progress Notes (Signed)
OT Cancellation Note  Patient Details Name: Luis Lindsey MRN: [id number] DOB: [birth year]   Cancelled Treatment:    Reason Eval/Treat Not Completed: OT screened, no needs identified, will sign off.  Vinal Rosengrant L Tenessa Marsee 02/28/2022, 1:14 PM

## 2022-02-28 NOTE — ED Notes (Signed)
Secure message sent to receiving RN Marya Landry, awaiting response. Pt has an inpatient bed at this time

## 2022-02-28 NOTE — ED Notes (Signed)
Pt appears to be sleeping, observe even RR and unlabored, NAD noted, side rails up x2 for safety, plan of care ongoing, call light within reach, no further concerns as of present.   

## 2022-02-28 NOTE — TOC Transition Note (Signed)
Transition of Care [facility]) - CM/SW Discharge Note   Patient Details  Name: Luis Lindsey MRN: [id number] Date of Birth: [birth year]  Transition of Care [facility]) CM/SW Contact:  Leeroy Cha, RN Phone Number: 02/28/2022, 1:01 PM   Clinical Narrative:    Dcd to home with self care and no toc needs   Final next level of care: Home/Self Care Barriers to Discharge: Barriers Resolved   Patient Goals and CMS Choice Patient states their goals for this hospitalization and ongoing recovery are:: to go home CMS Medicare.gov Compare Post Acute Care list provided to:: Patient    Discharge Placement                       Discharge Plan and Services   Discharge Planning Services: CM Consult                                 Social Determinants of Health (SDOH) Interventions     Readmission Risk Interventions   No data to display

## 2022-02-28 NOTE — ED Notes (Signed)
Pt resting and watching TV, NAD noted, observed even RR and unlabored, side rails up x2 for safety, plan of care ongoing, pt expresses no needs or concerns at this time, call light within reach, no further concerns as of present.  

## 2022-02-28 NOTE — Evaluation (Signed)
Physical Therapy One Time Evaluation Patient Details Name: Luis Lindsey MRN: [id number] DOB: 1950-08-21 Today's Date: 02/28/2022  History of Present Illness  Pt is a 71 year old male admitted with symptomatic anemia.  PMHx significant for but not limited to HTN, HLD, DM, back surgery, PVD  Clinical Impression  Patient evaluated by Physical Therapy with no further acute PT needs identified. All education has been completed and the patient has no further questions.  Pt ambulated in hallway and denies symptoms.  Pt reports feeling much better and less fatigued. See below for any follow-up Physical Therapy or equipment needs. PT is signing off. Thank you for this referral.        Recommendations for follow up therapy are one component of a multi-disciplinary discharge planning process, led by the attending physician.  Recommendations may be updated based on patient status, additional functional criteria and insurance authorization.  Follow Up Recommendations No PT follow up      Assistance Recommended at Discharge None  Patient can return home with the following       Equipment Recommendations None recommended by PT  Recommendations for Other Services       Functional Status Assessment Patient has not had a recent decline in their functional status     Precautions / Restrictions Precautions Precautions: None Restrictions Weight Bearing Restrictions: No      Mobility  Bed Mobility Overal bed mobility: Independent                  Transfers Overall transfer level: Independent                      Ambulation/Gait Ambulation/Gait assistance: Independent Gait Distance (Feet): 380 Feet Assistive device: None Gait Pattern/deviations: WFL(Within Functional Limits)       General Gait Details: SPO2 100% on room air after 250 ft, no symptoms reported, no deviations or LOB observed  Stairs            Wheelchair Mobility    Modified Rankin (Stroke  Patients Only)       Balance Overall balance assessment: No apparent balance deficits (not formally assessed)                                           Pertinent Vitals/Pain Pain Assessment Pain Assessment: No/denies pain    Home Living Family/patient expects to be discharged to:: Private residence Living Arrangements: Spouse/significant other Available Help at Discharge: Family Type of Home: House Home Access: Stairs to enter Entrance Stairs-Rails: Left Entrance Stairs-Number of Steps: 2   Home Layout: One level Home Equipment: Cane - single point;Crutches;Grab bars - toilet;Grab bars - tub/shower;Hand held shower head;Adaptive equipment;Wheelchair - Brewing technologist      Prior Function Prior Level of Function : Independent/Modified Independent             Mobility Comments: has a husky named [employer], caretaker for wife (she has Alzheimers)       Hand Dominance        Extremity/Trunk Assessment   Upper Extremity Assessment Upper Extremity Assessment: Overall WFL for tasks assessed    Lower Extremity Assessment Lower Extremity Assessment: Overall WFL for tasks assessed    Cervical / Trunk Assessment Cervical / Trunk Assessment: Normal  Communication   Communication: No difficulties  Cognition Arousal/Alertness: Awake/alert Behavior During Therapy: WFL for tasks assessed/performed Overall Cognitive  Status: Within Functional Limits for tasks assessed                                          General Comments      Exercises     Assessment/Plan    PT Assessment Patient does not need any further PT services  PT Problem List         PT Treatment Interventions      PT Goals (Current goals can be found in the Care Plan section)  Acute Rehab PT Goals PT Goal Formulation: All assessment and education complete, DC therapy    Frequency       Co-evaluation               AM-PAC PT "6 Clicks" Mobility   Outcome Measure                  End of Session   Activity Tolerance: Patient tolerated treatment well Patient left: in bed;with call bell/phone within reach   PT Visit Diagnosis: Difficulty in walking, not elsewhere classified (R26.2)    Time: 1214-1227 PT Time Calculation (min) (ACUTE ONLY): 13 min   Charges:   PT Evaluation $PT Eval Low Complexity: 1 Low        Kati PT, DPT Physical Therapist Acute Rehabilitation Services Preferred contact method: Secure Chat Weekend Pager Only: [phone] Office: [place] 02/28/2022, 12:53 PM

## 2022-02-28 NOTE — Plan of Care (Signed)
Problem: Education: Goal: Ability to describe self-care measures that may prevent or decrease complications (Diabetes Survival Skills Education) will improve Outcome: Adequate for Discharge Goal: Individualized Educational Video(s) Outcome: Adequate for Discharge   Problem: Coping: Goal: Ability to adjust to condition or change in health will improve Outcome: Adequate for Discharge   Problem: Fluid Volume: Goal: Ability to maintain a balanced intake and output will improve Outcome: Adequate for Discharge   Problem: Health Behavior/Discharge Planning: Goal: Ability to identify and utilize available resources and services will improve Outcome: Adequate for Discharge Goal: Ability to manage health-related needs will improve Outcome: Adequate for Discharge   Problem: Metabolic: Goal: Ability to maintain appropriate glucose levels will improve Outcome: Adequate for Discharge   Problem: Nutritional: Goal: Maintenance of adequate nutrition will improve Outcome: Adequate for Discharge Goal: Progress toward achieving an optimal weight will improve Outcome: Adequate for Discharge   Problem: Skin Integrity: Goal: Risk for impaired skin integrity will decrease Outcome: Adequate for Discharge   Problem: Tissue Perfusion: Goal: Adequacy of tissue perfusion will improve Outcome: Adequate for Discharge   Problem: Education: Goal: Knowledge of General Education information will improve Description: Including pain rating scale, medication(s)/side effects and non-pharmacologic comfort measures Outcome: Adequate for Discharge   Problem: Health Behavior/Discharge Planning: Goal: Ability to manage health-related needs will improve Outcome: Adequate for Discharge   Problem: Clinical Measurements: Goal: Ability to maintain clinical measurements within normal limits will improve Outcome: Adequate for Discharge Goal: Will remain free from infection Outcome: Adequate for Discharge Goal:  Diagnostic test results will improve Outcome: Adequate for Discharge Goal: Respiratory complications will improve Outcome: Adequate for Discharge Goal: Cardiovascular complication will be avoided Outcome: Adequate for Discharge   Problem: Activity: Goal: Risk for activity intolerance will decrease Outcome: Adequate for Discharge   Problem: Nutrition: Goal: Adequate nutrition will be maintained Outcome: Adequate for Discharge   Problem: Coping: Goal: Level of anxiety will decrease Outcome: Adequate for Discharge   Problem: Elimination: Goal: Will not experience complications related to bowel motility Outcome: Adequate for Discharge Goal: Will not experience complications related to urinary retention Outcome: Adequate for Discharge   Problem: Pain Managment: Goal: General experience of comfort will improve Outcome: Adequate for Discharge   Problem: Safety: Goal: Ability to remain free from injury will improve Outcome: Adequate for Discharge   Problem: Skin Integrity: Goal: Risk for impaired skin integrity will decrease Outcome: Adequate for Discharge   Problem: Education: Goal: Knowledge of General Education information will improve Description: Including pain rating scale, medication(s)/side effects and non-pharmacologic comfort measures Outcome: Adequate for Discharge   Problem: Health Behavior/Discharge Planning: Goal: Ability to manage health-related needs will improve Outcome: Adequate for Discharge   Problem: Clinical Measurements: Goal: Ability to maintain clinical measurements within normal limits will improve Outcome: Adequate for Discharge Goal: Will remain free from infection Outcome: Adequate for Discharge Goal: Diagnostic test results will improve Outcome: Adequate for Discharge Goal: Respiratory complications will improve Outcome: Adequate for Discharge Goal: Cardiovascular complication will be avoided Outcome: Adequate for Discharge   Problem:  Activity: Goal: Risk for activity intolerance will decrease Outcome: Adequate for Discharge   Problem: Nutrition: Goal: Adequate nutrition will be maintained Outcome: Adequate for Discharge   Problem: Coping: Goal: Level of anxiety will decrease Outcome: Adequate for Discharge   Problem: Elimination: Goal: Will not experience complications related to bowel motility Outcome: Adequate for Discharge Goal: Will not experience complications related to urinary retention Outcome: Adequate for Discharge   Problem: Pain Managment: Goal: General experience of comfort   will improve Outcome: Adequate for Discharge   Problem: Safety: Goal: Ability to remain free from injury will improve Outcome: Adequate for Discharge   Problem: Skin Integrity: Goal: Risk for impaired skin integrity will decrease Outcome: Adequate for Discharge   

## 2022-02-28 NOTE — ED Notes (Signed)
2nd blood transfusion completed  ([id number]), no s/s of transfusion reactions, pt reports no complaints or pain, denies CP or SOB.

## 2022-02-28 NOTE — Discharge Summary (Signed)
Physician Discharge Summary   Patient: Luis Lindsey MRN: [id number] DOB: April 16, 1951  Admit date:     02/27/2022  Discharge date: 02/28/22  Discharge Physician: Raiford Noble, DO    PCP: Alroy Dust, Carlean Jews.[name], MD   Recommendations at discharge:   Follow-up with PCP within 1 to 2 weeks and repeat CBC, CMP, mag, Phos within 1 week Follow-up with gastroenterology within 1 to 2 weeks  Discharge Diagnoses: Principal Problem:   Symptomatic anemia Active Problems:   Iron deficiency anemia   Essential hypertension   DM type 2 with diabetic peripheral neuropathy (HCC)   S/P CABG x 3  Resolved Problems:   * No resolved hospital problems. [facility] Course: No notes on file  Assessment and Plan: * Symptomatic anemia Admit to observation med/surg bed. Transfuse 2 units PRBC. IV lasix 20 mg after 1st unit of PRBC. Repeat CBC in AM. Pt denies any melena, BRBPR. No hematuria. Has had colonoscopy by [facility] in the past 12 months. Pt states this was negative. occ dyspepsia. Has not had EGD before. Recommend outpatient GI referral for EGD. DC pt to home with 3 months of oral iron(either Nu-iron 150 mg daily vs FeSO4 325 mg daily). Repeat CBC in 1-2 months. If not improved and GI workup negative, pt may need IV iron infusions. Also check PSA as outpatient.  Iron deficiency anemia See above. DC to home with oral iron.  S/P CABG x 3 Continue ASA 81 mg daily, crestor 40 mg daily  DM type 2 with diabetic peripheral neuropathy (HCC) Stable. On tresiba and metformin at home. Check A1c. Add SSI during hospitalizaiton.  Essential hypertension Stable. Continue lotensin 20 mg dailiy.      {Tip this will not be part of the note when signed Body mass index is 22.81 kg/m. , ,  (Optional):26781}  {(NOTE) Pain control PDMP Statment (Optional):26782} Consultants: *** Procedures performed: ***  Disposition: {Plan; Disposition:26390} Diet recommendation:  {Diet_Plan:26776} DISCHARGE  MEDICATION: Allergies as of 02/28/2022       Reactions   Codeine Nausea Only     Med Rec must be completed prior to using this [facility]***       Discharge Exam: Filed Weights   02/27/22 1809  Weight: 72.1 kg   ***  Condition at discharge: {DC Condition:26389}  The results of significant diagnostics from this hospitalization (including imaging, microbiology, ancillary and laboratory) are listed below for reference.   Imaging Studies: VAS [place] LOWER EXTREMITY BYPASS GRAFT DUPLEX  Result Date: 02/14/2022 LOWER EXTREMITY ARTERIAL DUPLEX STUDY Patient Name:  EAGLE PITTA  Date of Exam:   02/13/2022 Medical Rec #: [id number]       Accession #:    [id number] Date of Birth: 04-Oct-1950        Patient Gender: M Patient Age:   15 years Exam Location:  [facility] Procedure:      VAS [place] LOWER EXTREMITY BYPASS GRAFT DUPLEX Referring Phys: Dagoberto Ligas --------------------------------------------------------------------------------  Indications: Peripheral artery disease, and patient has a h/o multiple              interventions to the left lower extremity. He denies any              claudication symptoms or rest pain. High Risk Factors: Hypertension, hyperlipidemia, Diabetes, past history of                    smoking, coronary artery disease. Other Factors: TIA.  Vascular Interventions: Left common femoral  endarterectomy with profundoplasty                         and endarterectomy of the proximal SFA with bovine                         pericardial patch angioplasty, and left common femoral                         to below-knee popliteal artery bypass with 6 mm ringed                         PTFE on 06/05/2021. Atherectomy and angioplasty to the                         left SFA and AK popliteal artery on 12/02/2019. Left                         external iliac artery stent placement on 06/18/2018. Current ABI:            .73 on the right and 1.01 on the left Comparison Study: NA  Performing Technologist: Sharlett Iles RVT  Examination Guidelines: A complete evaluation includes B-mode imaging, spectral Doppler, color Doppler, and power Doppler as needed of all accessible portions of each vessel. Bilateral testing is considered an integral part of a complete examination. Limited examinations for reoccurring indications may be performed as noted.   +--------+--------+-----+---------------+---------+----------------------------+ LEFT    PSV cm/sRatioStenosis       Waveform Comments                     +--------+--------+-----+---------------+---------+----------------------------+ CFA Prox188                         biphasic                              +--------+--------+-----+---------------+---------+----------------------------+ DFA     346          50-74% stenosisbiphasic s/p profundoplasty, 50-99%                                                restenosis                   +--------+--------+-----+---------------+---------+----------------------------+ TP Trunk124                         triphasic                             +--------+--------+-----+---------------+---------+----------------------------+  Left Graft #1: CFA to BK Popliteal Artery +--------------------+--------+--------+---------+--------+                     PSV cm/sStenosisWaveform Comments +--------------------+--------+--------+---------+--------+ Inflow              132             triphasic         +--------------------+--------+--------+---------+--------+ Proximal Anastomosis196             biphasic          +--------------------+--------+--------+---------+--------+  Proximal Graft      159             biphasic          +--------------------+--------+--------+---------+--------+ Mid Graft           61              biphasic          +--------------------+--------+--------+---------+--------+ Distal Graft        66              triphasic          +--------------------+--------+--------+---------+--------+ Distal Anastomosis  91              triphasic         +--------------------+--------+--------+---------+--------+ Outflow             121             triphasic         +--------------------+--------+--------+---------+--------+ Widely patent left common femoral artery to below-the-knee popliteal artery bypass graft without evidence of stenosis.  Summary: Left: Marked improvement is noted compared to previous study. 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 Widely patent left common femoral artery to below-the-knee popliteal artery bypass graft without evidence of stenosis.  See table(s) above for measurements and observations. See ABI report. Electronically signed by Servando Snare MD on 02/14/2022 at 4:35:51 PM.    Final    VAS [place] ABI WITH/WO TBI  Result Date: 02/13/2022  LOWER EXTREMITY DOPPLER STUDY Patient Name:  GORDAN GRELL  Date of Exam:   02/13/2022 Medical Rec #: [id number]       Accession #:    [id number] Date of Birth: 07-26-1950        Patient Gender: M Patient Age:   14 years Exam Location:  [facility] Procedure:      VAS [place] ABI WITH/WO TBI Referring Phys: Dagoberto Ligas --------------------------------------------------------------------------------  Indications: Peripheral artery disease. Patient has a h/o multiple interventions              to the left lower extremity. He denies any claudication symptoms or              rest pain. High Risk Factors: Hypertension, hyperlipidemia, Diabetes, past history of                    smoking, coronary artery disease. Other Factors: TIA.  Vascular Interventions: Left common femoral endarterectomy with profundoplasty                         and endarterectomy of the proximal SFA with bovine                         pericardial patch angioplasty, and left common femoral                         to below-knee popliteal artery bypass with 6 mm ringed                          PTFE on 06/05/2021. Atherectomy and angioplasty to the                         left SFA and AK popliteal artery on 12/02/2019. Left  external iliac artery stent placement on 06/18/2018. Comparison Study: In 09/2021, a lower arterial Doppler for pre CABG showed an                   ABI of .75 on the right and .97 on the left. Performing Technologist: Sharlett Iles RVT  Examination Guidelines: A complete evaluation includes at minimum, Doppler waveform signals and systolic blood pressure reading at the level of bilateral brachial, anterior tibial, and posterior tibial arteries, when vessel segments are accessible. Bilateral testing is considered an integral part of a complete examination. Photoelectric Plethysmograph (PPG) waveforms and toe systolic pressure readings are included as required and additional duplex testing as needed. Limited examinations for reoccurring indications may be performed as noted.  ABI Findings: +---------+------------------+-----+--------+--------+ Right    Rt Pressure (mmHg)IndexWaveformComment  +---------+------------------+-----+--------+--------+ Brachial 178                                     +---------+------------------+-----+--------+--------+ PTA      130               0.73 biphasic         +---------+------------------+-----+--------+--------+ DP       113               0.63 biphasic         +---------+------------------+-----+--------+--------+ Great Toe91                0.51 Abnormal         +---------+------------------+-----+--------+--------+ +---------+------------------+-----+---------+-------+ Left     Lt Pressure (mmHg)IndexWaveform Comment +---------+------------------+-----+---------+-------+ Brachial 172                                     +---------+------------------+-----+---------+-------+ PTA      180               1.01 triphasic        +---------+------------------+-----+---------+-------+  DP       166               0.93 triphasic        +---------+------------------+-----+---------+-------+ Great Toe128               0.72 Normal           +---------+------------------+-----+---------+-------+ +-------+-----------+-----------+------------+------------+ ABI/TBIToday's ABIToday's TBIPrevious ABIPrevious TBI +-------+-----------+-----------+------------+------------+ Right  .73        .51        .75         .49          +-------+-----------+-----------+------------+------------+ Left   1.01       .72        .97         .70          +-------+-----------+-----------+------------+------------+  Bilateral ABIs and TBIs appear essentially unchanged compared to prior study on 10/24/2021.  Summary: Right: Resting right ankle-brachial index indicates moderate right lower extremity arterial disease. The right toe-brachial index is abnormal. Left: Resting left ankle-brachial index is within normal range. The left toe-brachial index is normal. *See table(s) above for measurements and observations. See LE Arterial duplex report. Electronically signed by Monica Martinez MD on 02/13/2022 at 1:34:46 PM.    Final (Updated)     Microbiology: Results for orders placed or performed during the hospital encounter of 10/24/21  Surgical pcr screen  Status: None   Collection Time: 10/24/21 11:33 AM   Specimen: Nasal Mucosa; Nasal Swab  Result Value Ref Range Status   MRSA, PCR NEGATIVE NEGATIVE Final   Staphylococcus aureus NEGATIVE NEGATIVE Final    Comment: (NOTE) The Xpert SA Assay (FDA approved for NASAL specimens in patients 69 years of age and older), is one component of a comprehensive surveillance program. It is not intended to diagnose infection nor to guide or monitor treatment. Performed at [facility], [place] [address] [place], [place] [address]   SARS CORONAVIRUS 2 (TAT 6-24 HRS) Anterior Nasal Swab     Status: None   Collection Time: 10/24/21 11:33 AM    Specimen: Anterior Nasal Swab  Result Value Ref Range Status   SARS Coronavirus 2 NEGATIVE NEGATIVE Final    Comment: (NOTE) SARS-CoV-2 target nucleic acids are NOT DETECTED.  The SARS-CoV-2 RNA is generally detectable in upper and lower respiratory specimens during the acute phase of infection. Negative results do not preclude SARS-CoV-2 infection, do not rule out co-infections with other pathogens, and should not be used as the sole basis for treatment or other patient management decisions. Negative results must be combined with clinical observations, patient history, and epidemiological information. The expected result is Negative.  Fact Sheet for Patients: SugarRoll.be  Fact Sheet for Healthcare Providers: https://www.woods-mathews.com/  This test is not yet approved or cleared by the [place] FDA and  has been authorized for detection and/or diagnosis of SARS-CoV-2 by FDA under an Emergency Use Authorization (EUA). This EUA will remain  in effect (meaning this test can be used) for the duration of the COVID-19 declaration under Se ction 564(b)(1) of the Act, 21 U.S.C. section 360bbb-3(b)(1), unless the authorization is terminated or revoked sooner.  Performed at [facility], [place] [address] [place], [place] [address]     Labs: CBC: Recent Labs  Lab 02/27/22 1819 02/28/22 0501  WBC 5.9 7.1  NEUTROABS 3.5 4.7  HGB 6.2* 9.2*  HCT 22.1* 30.5*  MCV 80.4 81.6  PLT 318 193   Basic Metabolic Panel: Recent Labs  Lab 02/27/22 1819 02/28/22 0501  NA 137 139  K 3.8 2.8*  CL 111 109  CO2 21* 24  GLUCOSE 152* 60*  BUN 14 10  CREATININE 0.90 0.84  CALCIUM 8.8* 8.7*  MG  --  2.1  PHOS  --  3.1   Liver Function Tests: Recent Labs  Lab 02/27/22 1819 02/28/22 0501  AST 47* 32  ALT 46* 39  ALKPHOS 74 67  BILITOT 0.7 0.6  PROT 7.1 7.0  ALBUMIN 3.5 3.2*   CBG: Recent Labs  Lab 02/27/22 2050 02/28/22 0736  02/28/22 0819 02/28/22 0851 02/28/22 1200  GLUCAP 82 46* 54* 81 249*    Discharge time spent: {LESS THAN/GREATER THAN:26388} 30 minutes.  Signed: Kerney Elbe, DO [facility] 02/28/2022

## 2022-03-01 DIAGNOSIS — E8809 Other disorders of plasma-protein metabolism, not elsewhere classified: Secondary | ICD-10-CM

## 2022-03-01 DIAGNOSIS — E876 Hypokalemia: Secondary | ICD-10-CM

## 2022-03-01 DIAGNOSIS — R7989 Other specified abnormal findings of blood chemistry: Secondary | ICD-10-CM

## 2022-03-01 DIAGNOSIS — M5416 Radiculopathy, lumbar region: Secondary | ICD-10-CM | POA: Diagnosis not present

## 2022-03-02 ENCOUNTER — Ambulatory Visit (HOSPITAL_COMMUNITY): Payer: HMO

## 2022-03-05 ENCOUNTER — Ambulatory Visit (HOSPITAL_COMMUNITY): Payer: HMO

## 2022-03-07 ENCOUNTER — Ambulatory Visit (HOSPITAL_COMMUNITY): Payer: HMO

## 2022-03-07 DIAGNOSIS — D649 Anemia, unspecified: Secondary | ICD-10-CM | POA: Diagnosis not present

## 2022-03-09 ENCOUNTER — Ambulatory Visit (HOSPITAL_COMMUNITY): Payer: HMO

## 2022-03-12 ENCOUNTER — Ambulatory Visit (HOSPITAL_COMMUNITY): Payer: HMO

## 2022-03-14 ENCOUNTER — Ambulatory Visit (HOSPITAL_COMMUNITY): Payer: HMO

## 2022-03-16 ENCOUNTER — Ambulatory Visit (HOSPITAL_COMMUNITY): Payer: HMO

## 2022-03-19 ENCOUNTER — Ambulatory Visit (HOSPITAL_COMMUNITY): Payer: HMO

## 2022-03-21 ENCOUNTER — Ambulatory Visit (HOSPITAL_COMMUNITY): Payer: HMO

## 2022-03-23 ENCOUNTER — Ambulatory Visit (HOSPITAL_COMMUNITY): Payer: HMO

## 2022-03-25 NOTE — Progress Notes (Unsigned)
Cardiology Office Note:    Date:  03/28/2022   ID:  Luis Lindsey, DOB 1951-04-07, MRN [id number]  PCP:  Aurea Graff.[name], MD  [facility] Cardiologist:  Freada Bergeron, MD  [place] Electrophysiologist:  None   Referring MD: Alroy Dust, Carlean Jews.[name], MD    History of Present Illness:    Luis Lindsey is a 71 y.o. male with a hx of PAD s/p L SFA atherectomy with angioplasty, carotid artery disease s/p L CEA, HTN, HLD, moderate AS, and tobacco use who returns to clinic for follow-up.  Patient was initially seen in 03/2020 after having episodes of lightheadedness in the setting of anemia, moderate AS, and relative hypotension. We stopped his HCTZ and decreased his metop to '25mg'$  XL daily. Symptoms resolved. TTE 08/2019 with normal EF. Moderate aortic valve stenosis. Aortic valve area, by VTI measures 1.21 cm. Aortic valve mean gradient measures 14.5 mmHg. Aortic valve Vmax measures 2.76 m/s.   Had abdominal aortogram in 02/2021 with Dr. Carlis Abbott where he was found to have ostial high grade SFA stenosis. Was recommended for bypass at that time. He subsequently underwent  left common femoral endarterectomy with profundoplasty and endarterectomy of the proximal SFA with bovine pericardial patch angioplasty and left common femoral to below-knee popliteal artery bypass on 06/05/21 without issues. Was discharged on ASA and plavix.   Seen in clinic on 06/29/20 where he was doing well. Had stable exertional dyspnea but no chest pain. Repeat TTE 06/2020 with EF 60-65%, G1DD AVA 1.33, mean gradient 69mHg, Vmax 2.84.   Seen by me on 08/28/21 where he had episode of substernal chest pressure radiating down his left arm. Coronary CTA was ordered which revealed coronary artery calcium score 4204, 98th percentile for age and gender.  Aortic valve calcium score 1928, very heavy proximal to mid coronary calcification so difficult to quantify degree of stenosis.  Suspicion of severe ostial RCA stenosis as well.  Proximal LAD, ramus, and LCx appear moderately stenosed.  Possible severe mid LCx stenosis with FFR that revealed multiple hemodynamically significant stenoses. He was advised to return to our office for discussion of cardiac catheterization.   Was seen on 10/03/2021 by VRobbie Lis PA with worsening chest pain concerning for angina. Cardiac catheterization was scheduled for 10/05/2021 and revealed severe multivessel coronary disease including diffuse LMCA disease of up to 70% with pressure dampening of 29F diagnostic catheter, 90% distal LCx stenosis, and sequential 70-80% ostial, 40% proximal, 80% mid, and 50% distal RCA lesions. Widespread heavy calcification of the coronary arteries is evident, upper normal left ventricular filling pressure (LVEDP 15 mmHg), mild to moderate aortic valve stenosis (peak-to-peak gradient 21 mmHg). He was seen by CT surgery for CABG consultation. On 10/25/21 he underwent CABG x 3 utilizing LIMA to LAD, SVG to PDA, and SVG to OM1 and aortic valve replacement with a 23 mm Edwards Resilia Valve, and endoscopic harvest of greater saphenous vein from right leg. Beta blockers were held due to issues with junctional rhythm alternating with complete heart block but returned to normal sinus rhythm by POD 3. He was discharged on 10/31/21.  Was seen by MChristen Bame NP on 11/16/21 in follow-up. Was having dizziness with position changes and midsternal chest soreness. Was noted to be in a junctional rhythm with occasionally conducted sinus beats  Was seen on 11/27/21 where he was having dizziness. He was in a junctional rhythm at that time. Cardiac monitor 12/01/21 showed 11 runs of nonsustained SVT with rare ectopy. TTE 12/05/21 with  LVEF 55-60%, normal RV, mild LAE, trivial MR, well functionin 87m Inpsiris Resilla valve with mean gradient 176mg, mild dilation of the ascending aorta 3874m  Was seen by Dr. CamCurt Bears 12/04/21. Given that his cardiac monitor showed no recurrence of  junctional bradycardia. PPM was deemed not needed at that time.  Today, the patient overall feels better. Was hospitalized 02/27/22-02/28/22 for significant anemia with hemoglobin 5.9. FOBT negative. UA without significant blood. Received 2units PRBCs and was started on a PPI. He was discharged home with plans for GI follow-up. Has been taking iron pills. Repeat hemoglobin 10.2 last week. He remains on DAPT.  Otherwise, he denies any chest pain. Felt more SOB with his profound anemia but this is improving. No significant lightheadedness, dizziness, syncope. Denies any dark tarry stool, blood in the stool, or hematuria. Has some leg weakness which he thought was related to the anemia but he also has profunda artery stenosis for which he is following with vascular. Blood pressure is controlled. Compliant with meds.   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   Dysphagia    Esophageal dysmotility    GERD (gastroesophageal reflux disease)    Heart murmur    pt had recent echocardiogram   History of hiatal hernia    History of Holter monitoring 2009   History of kidney stones    HNP (herniated nucleus pulposus), lumbar    L4-5   HYPERCHOLESTEROLEMIA 11/30/2009   Hyperlipidemia    HYPERTENSION 11/30/2009   Kidney stones    Numbness and tingling    Peripheral vascular disease ([place]  Right carotid bruit    SMOKER 11/30/2009   Stroke ([id number]   URINARY CALCULUS 11/30/2009    Past Surgical History:  Procedure Laterality Date   ABDOMINAL AORTOGRAM W/LOWER EXTREMITY N/A 06/18/2018   Procedure: ABDOMINAL AORTOGRAM W/LOWER EXTREMITY;  Surgeon: ClaMarty HeckD;  Location: [facility] [place] [facility];  Service: Cardiovascular;  Laterality: N/A;   ABDOMINAL AORTOGRAM W/LOWER EXTREMITY N/A 12/02/2019   Procedure:  ABDOMINAL AORTOGRAM W/LOWER EXTREMITY;  Surgeon: ClaMarty HeckD;  Location: [facility] [place] [facility];  Service: Cardiovascular;  Laterality: N/A;   ABDOMINAL AORTOGRAM W/LOWER EXTREMITY N/A 03/23/2021   Procedure: ABDOMINAL AORTOGRAM W/LOWER EXTREMITY;  Surgeon: ClaMarty HeckD;  Location: [facility] [place] [facility];  Service: Cardiovascular;  Laterality: N/A;   AORTIC VALVE REPLACEMENT N/A 10/25/2021   Procedure: AORTIC VALVE REPLACEMENT (AVR)USING 23MM INSPIRIS RESILIA  AORTIC VALVE;  Surgeon: LigLajuana MatteD;  [facility] [place] [facility];  Laterality: N/A;   arthroscopy of right shoulder Right    Jan 2019, June 2019-- [facility]     CATARACT EXTRACTION W/ INTRAOCULAR LENS  IMPLANT, BILATERAL     CORONARY ARTERY BYPASS GRAFT N/A 10/25/2021   Procedure: CORONARY ARTERY BYPASS GRAFTING (CABG) X 3 USING LEFT INTERNAL MAMMARY ARTERY AND RIGHT GREATER SAPHENOUS VEIN;  Surgeon: LigLajuana MatteD;  [facility] [place];  Laterality: N/A;   CYSTOSCOPY     ENDARTERECTOMY Right 08/24/2019   Procedure: ENDARTERECTOMY CAROTID;  Surgeon: ClaMarty HeckD;  [facility] [place];  Laterality: Right;   ENDARTERECTOMY FEMORAL Left 06/05/2021   Procedure: ENDARTERECTOMY FEMORAL WITH PROFUNDAPLASTY;  Surgeon: Marty Heck, MD;  Location: [place];  Service: Vascular;  Laterality: Left;   ENDOVEIN HARVEST OF GREATER SAPHENOUS VEIN Right 10/25/2021   Procedure: ENDOVEIN HARVEST OF GREATER SAPHENOUS VEIN;  Surgeon: Lajuana Matte, MD;  Location: [place];  Service: Open Heart Surgery;  Laterality: Right;   FACIAL COSMETIC SURGERY  2014   FEMORAL-POPLITEAL BYPASS GRAFT Left 06/05/2021   Procedure: LEFT FEMORAL-POPLITEAL ARTERY BYPASS GRAFTING USING 41m PROPATEN VASCULAR REMOVABLE RING GRAFT;  Surgeon: CMarty Heck MD;  [facility]  Service: Vascular;  Laterality:  Left;   FINGER SURGERY  2002   LEFT HEART CATH AND CORONARY ANGIOGRAPHY N/A 10/05/2021   Procedure: LEFT HEART CATH AND CORONARY ANGIOGRAPHY;  Surgeon: ENelva Bush MD;  [place];  Service: Cardiovascular;  Laterality: N/A;   LUMBAR LAMINECTOMY/DECOMPRESSION MICRODISCECTOMY Left 08/01/2018   Procedure: Microdiscectomy - Lumbar four-Lumbar five - left;  Surgeon: JEustace Moore MD;  [place]  Service: Neurosurgery;  Laterality: Left;   LUMBAR LAMINECTOMY/DECOMPRESSION MICRODISCECTOMY Left 10/15/2018   Procedure: Re-do Microdiscectomy - left - Lumbar four-Lumbar five;  Surgeon: JEustace Moore MD;  [place]  Service: Neurosurgery;  Laterality: Left;   NECK SURGERY  1986   PATCH ANGIOPLASTY Left 06/05/2021   Procedure: PATCH ANGIOPLASTY USING XRueben BashBIOLOGIC PATCH;  Surgeon: CMarty Heck MD;  [place]  Service: Vascular;  Laterality: Left;   PERIPHERAL VASCULAR ATHERECTOMY  12/02/2019   Procedure: PERIPHERAL VASCULAR ATHERECTOMY;  Surgeon: CMarty Heck MD;  [facility];  Service: Cardiovascular;;  left SFA   PERIPHERAL VASCULAR INTERVENTION  06/18/2018   Procedure: PERIPHERAL VASCULAR INTERVENTION;  Surgeon: CMarty Heck MD;  [place] [facility];  Service: Cardiovascular;;  Left external iliac   ROTATOR CUFF REPAIR Left    TEE WITHOUT CARDIOVERSION N/A 10/25/2021   Procedure: TRANSESOPHAGEAL ECHOCARDIOGRAM (TEE);  Surgeon: LLajuana Matte MD;  [place]  Service: Open Heart Surgery;  Laterality: N/A;   UMBILICAL HERNIA REPAIR      Current Medications: Current Meds  Medication Sig   acetaminophen (TYLENOL) 500 MG tablet Take 1,000 mg by mouth every 6 (six) hours as needed for mild pain.   aspirin EC 81 MG EC tablet Take 1 tablet (81 mg total) by mouth daily at 6 (six) AM.   benazepril (LOTENSIN) 20 MG tablet Take 1 tablet (20 mg total) by mouth daily.   clopidogrel (PLAVIX) 75 MG tablet  TAKE 1 TABLET BY MOUTH EVERY DAY   Continuous Blood Gluc Sensor (FREESTYLE LIBRE 2 SENSOR) MISC AS DIRECTED FOR BLOOD SUGAR CHECK DX E11.42   ezetimibe (ZETIA) 10 MG tablet Take 1 tablet (10 mg total) by mouth daily.   insulin degludec (TRESIBA) 200 UNIT/ML FlexTouch Pen Inject 34 Units into the skin in the morning.   iron polysaccharides (NIFEREX) 150 MG capsule Take 1 capsule (150 mg total) by mouth daily.   metFORMIN (GLUCOPHAGE-XR) 500 MG 24 hr tablet Take 2 tablets (1,000 mg total) by mouth 2 (two) times daily.   ONETOUCH VERIO test strip 1 each 3 (three) times daily.   pantoprazole (PROTONIX) 40 MG tablet Take 40 mg by mouth daily with breakfast.    pioglitazone (ACTOS) 45 MG tablet TAKE 1 TABLET DAILY (NEED APPOINTMENT FOR FURTHER REFILLS)   [DISCONTINUED] rosuvastatin (CRESTOR) 40 MG tablet Take 40 mg by mouth daily.  Allergies:   Codeine   Social History   Socioeconomic History   Marital status: Married    Spouse name: [name]    Number of children: 3   Years of education: 11   Highest education level: Not on file  Occupational History   Occupation: Truck Education administrator: [employer]  Tobacco Use   Smoking status: Former    Years: 40.00    Types: Cigarettes    Quit date: 12/2015    Years since quitting: 6.2    Passive exposure: Current (Wife Is a smoker)   Smokeless tobacco: Never   Tobacco comments:    Quit 12-2012  Vaping Use   Vaping Use: Never used  Substance and Sexual Activity   Alcohol use: No   Drug use: No   Sexual activity: Not on file  Other Topics Concern   Not on file  Social History Narrative   . Patient drinks 2-3 cups of caffeine daily.    Patient lives at home with his wife [name].    Patient works at [employer].    Education. 12 th grade   Right handed               Social Determinants of Health   Financial Resource Strain: Not on file  Food Insecurity: No Food Insecurity (02/28/2022)   Hunger Vital Sign    Worried About Running Out of  Food in the Last Year: Never true    Ran Out of Food in the Last Year: Never true  Transportation Needs: No Transportation Needs (02/28/2022)   PRAPARE - Hydrologist (Medical): No    Lack of Transportation (Non-Medical): No  Physical Activity: Not on file  Stress: Not on file  Social Connections: Not on file     Family History: The patient's family history includes CVA in his father; Coronary artery disease in his brother, brother, father, and mother; Diabetes in his mother and sister; Diabetes Mellitus I in his mother and sister; Heart disease in his brother, father, and mother; Stroke in his father.  ROS:   Please see the history of present illness.    Review of Systems  Constitutional:  Negative for chills, fever and malaise/fatigue.  HENT:  Negative for nosebleeds.   Respiratory:  Positive for shortness of breath.   Cardiovascular:  Positive for leg swelling. Negative for chest pain, palpitations, orthopnea, claudication and PND.  Gastrointestinal:  Negative for nausea and vomiting.  Genitourinary:  Negative for hematuria.  Musculoskeletal:  Negative for falls.  Neurological:  Negative for dizziness and loss of consciousness.  Endo/Heme/Allergies:  Negative for polydipsia.  Psychiatric/Behavioral:  Negative for substance abuse.     EKGs/Labs/Other Studies Reviewed:    The following studies were reviewed today: TTE 2021-12-27: IMPRESSIONS     1. Compared with echo 08/2021, there is now a well-functioning  bioprosthetic valve in the aortic position.   2. Left ventricular ejection fraction, by estimation, is 55 to 60%. The  left ventricle has normal function. The left ventricle has no regional  wall motion abnormalities. There is mild concentric left ventricular  hypertrophy. Left ventricular diastolic  parameters were normal. The average left ventricular global longitudinal  strain is -23.0 %. The global longitudinal strain is normal.   3. Right  ventricular systolic function is normal. The right ventricular  size is normal. There is normal pulmonary artery systolic pressure.   4. Left atrial size was mildly dilated.   5. The mitral valve  is normal in structure. Trivial mitral valve  regurgitation. No evidence of mitral stenosis.   6. The aortic valve has been repaired/replaced. Aortic valve  regurgitation is not visualized. No aortic stenosis is present. There is a  23 mm Inpsiris Resilia valve present in the aortic position. Procedure  Date: 10/25/21. Echo findings are consistent  with normal structure and function of the aortic valve prosthesis. Aortic  valve mean gradient measures 10.0 mmHg. Aortic valve Vmax measures 2.12  m/s.   7. Aortic dilatation noted. There is mild dilatation of the ascending  aorta, measuring 38 mm.   8. The inferior vena cava is normal in size with greater than 50%  respiratory variability, suggesting right atrial pressure of 3 mmHg.   Comparison(s): TEE 10/25/21 EF 60-65%. Mild AI. Moderate AS.  Cardiac Monitor 12/01/21: Patch wear time was 6 days and 22 hours Predominant rhythm was NSR with average HR 71bpm There were 11 runs of nonsustained SVT with longest lasting 5 beats Rare SVE (<1%), rare VE (<1%) Patient triggered events correlate with NSR and NSR with PVCs No Afib, junctional rhythms or significant pauses noted     Patch Wear Time:  6 days and 22 hours (2023-06-22T09:20:00-0400 to 2023-06-29T08:06:25-0400)   Patient had a min HR of 55 bpm, max HR of 141 bpm, and avg HR of 71 bpm. Predominant underlying rhythm was Sinus Rhythm. 11 Supraventricular Tachycardia runs occurred, the run with the fastest interval lasting 4 beats with a max rate of 141 bpm, the  longest lasting 5 beats with an avg rate of 114 bpm. Isolated SVEs were rare (<1.0%), SVE Couplets were rare (<1.0%), and SVE Triplets were rare (<1.0%). Isolated VEs were rare (<1.0%), and no VE Couplets or VE Triplets were present.   LHC  10/05/21   Severe multivessel coronary disease, including diffuse LMCA disease of up to 70% with pressure dampening of 44F diagnostic catheter, 90% distal LCx stenosis, and sequential 70-80% ostial, 40% proximal, 80% mid, and 50% distal RCA lesions.  Widespread heavy calcification of the coronary arteries is evident. Upper normal left ventricular filling pressure (LVEDP 15 mmHg). Mild-moderate aortic valve stenosis (peak-to-peak gradient 21 mmHg).   Recommendations: Outpatient cardiac surgery consultation for CABG. Aggressive secondary prevention of coronary artery disease. Continue dual antiplatelet therapy with aspirin and clopidogrel pending cardiac surgery evaluation and input from vascular surgery regarding interruption of clopidogrel in the setting of lower extremity interventions earlier this year   Coronary CT 09/27/21   1. Coronary artery calcium score 4204 Agatston units. This places the patient in the [place] percentile for age and gender.   2.  Aortic valve calcium score 1928 Agatston units.   3. Very heavy proximal-mid coronary calcification so difficult to quantify degree of stenosis. I suspect that there is severe ostial RCA stenosis. Proximal LAD, ramus, and LCx appear moderately stenosed. Possible severe mid LCx stenosis. Will send for FFR.   Echo 09/12/21    1. Mild to moderate AS (mean gradient 13 mmHg; AVA 1.5 cm2); gradient  slightly less on present study compared to 07/18/20.   2. Left ventricular ejection fraction, by estimation, is 60 to 65%. The  left ventricle has normal function. The left ventricle has no regional  wall motion abnormalities. Left ventricular diastolic parameters are  consistent with Grade I diastolic  dysfunction (impaired relaxation). The average left ventricular global  longitudinal strain is -20.9 %. The global longitudinal strain is normal.   3. Right ventricular systolic function is normal. The right ventricular  size is normal. Tricuspid  regurgitation signal is inadequate for assessing  PA pressure.   4. The mitral valve is normal in structure. Trivial mitral valve  regurgitation. No evidence of mitral stenosis.   5. The aortic valve is tricuspid. Aortic valve regurgitation is not  visualized. Mild to moderate aortic valve stenosis.   6. The inferior vena cava is normal in size with greater than 50%  respiratory variability, suggesting right atrial pressure of 3 mmHg.    EKG:  EKG is personally reviewed. 03/28/22:No new tracing 11/27/2021:  Sinus rhythm. Rate 70 bpm. 11/16/2021 Christen Bame, NP):  accelerated junctional rhythm at 63 bpm, reviewed with Drs. Johney Frame and Lovena Le.    Recent Labs: 02/28/2022: ALT 39; BUN 10; Creatinine, Ser 0.84; Hemoglobin 9.2; Magnesium 2.1; Platelets 295; Potassium 2.8; Sodium 139   Recent Lipid Panel    Component Value Date/Time   CHOL 94 06/06/2021 0329   TRIG 59 06/06/2021 0329   HDL 39 (L) 06/06/2021 0329   CHOLHDL 2.4 06/06/2021 0329   VLDL 12 06/06/2021 0329   LDLCALC 43 06/06/2021 0329     Physical Exam:    VS:  BP 138/80   Pulse 66   Ht '5\' 10"'$  (1.778 m)   Wt 163 lb 3.2 oz (74 kg)   SpO2 99%   BMI 23.42 kg/m     Wt Readings from Last 3 Encounters:  03/28/22 163 lb 3.2 oz (74 kg)  02/27/22 159 lb (72.1 kg)  02/13/22 161 lb (73 kg)     GEN:  Well nourished, well developed in no acute distress HEENT: Normal NECK: No JVD; no carotid bruits CARDIAC: RRR, 2/6 systolic murmur. RESPIRATORY:  Clear to auscultation without rales, wheezing or rhonchi  ABDOMEN: Soft, non-tender, non-distended MUSCULOSKELETAL:  No edema; No deformity  SKIN: Warm and dry NEUROLOGIC:  Alert and oriented x 3 PSYCHIATRIC:  Normal affect   ASSESSMENT:    1. S/P AVR (aortic valve replacement)   2. Essential hypertension   3. Bilateral carotid artery stenosis   4. Coronary artery disease of native artery of native heart with stable angina pectoris ([place])   5. PAD (peripheral artery  disease) (HCC)   6. Junctional cardiac arrhythmia   7. Aortic dilatation (HCC)     PLAN:    In order of problems listed above:  #Iron Deficiency Anemia: Recent hospitalization for HgB 5.9. FOBT negative and UA negative but patient is on DAPT and likely has a slow GI bleed. He was given [facility] and was started on PPI with significant improvement. Last Hgb 10. Has GI follow-up next week.  -Follow-up GI  -Continue PPI -Will continue DAPT for now given stable HgB  #Coronary Artery Disease s/p CABG x3: Cath 10/05/21 with severe multivessel coronary disease including diffuse LMCA disease of up to 70% with pressure dampening of 39F diagnostic catheter, 90% distal LCx stenosis, and sequential 70-80% ostial, 40% proximal, 80% mid, and 50% distal RCA lesions. Widespread heavy calcification of the coronary arteries is evident, upper normal left ventricular filling pressure (LVEDP 15 mmHg), mild to moderate aortic valve stenosis (peak-to-peak gradient 21 mmHg).He ultimately underwent CABGx3 on 10/25/21  utilizing LIMA to LAD, SVG to PDA, and SVG to OM1 and aortic valve replacement with a 23 mm Edwards Resilia Valve. TTE 11/2021 with LVEF 55-60%, normal functioning aortic valve prosthesis with mean gradient 43mHg.   -Continue ASA '81mg'$  daily -Continue plavix '75mg'$  daily  -Continue crestor '40mg'$  daily, zetia '10mg'$  daily -Not on BB due to post-op CHB  and episode of junctional arrhythmia in clinic  #Moderate AS: S/p aortic valve replacement with a 23 mm Edwards Resilia Valve at time of CABG as detailed above. TTE 11/2021 with well functioning prosthesis with mean gradient 1mHg.  -Continue serial monitoring  #PAD: S/p left common femoral endarterectomy with bovine patch angioplasty and left common femoral to below the knee popliteal artery bypass with PTFE by Dr. CCarlis Abbotton 06/05/2021. Claudication symptoms improved.  -Continue ASA '81mg'$  daily and plavix '75mg'$  daily -Continue PPI -Continue rosuvastatin '40mg'$   daily   #Carotid artery stenosis s/p L CEA: -Continue rosuvastatin '40mg'$  daily -Continue ASA '81mg'$ , plavix '75mg'$  daily  #Junctional Rhythm: Patient developed junctional rhythm alternating with complete heart block following AVR. This resolved but then recurred on 11/16/21. Cardiac monitor showed no recurrence of the rhythm fortunately and he was in NSR at follow-up with Dr. CCurt Bears Will continue to monitor at this time. -Will continue to monitor -Holding nodal agents   #HTN: #Concern for orthostasis: Significantly improved with stopping HCTZ.  -Stopped HCTZ and amlodipine -Continue benzapril '20mg'$  daily -Monitor BP at home   #HLD: LDL 43, TG 59 in 05/2021. -Continue rosuvastatin '40mg'$  daily -Continue zetia '10mg'$  daily  #DMII: -Referred to Endocrine  Follow-up:  3 months.   Medication Adjustments/Labs and Tests Ordered: Current medicines are reviewed at length with the patient today.  Concerns regarding medicines are outlined above.  Orders Placed This Encounter  Procedures   ECHOCARDIOGRAM COMPLETE   Meds ordered this encounter  Medications   rosuvastatin (CRESTOR) 40 MG tablet    Sig: Take 1 tablet (40 mg total) by mouth daily.    Dispense:  90 tablet    Refill:  2    Patient Instructions  Medication Instructions:   Your physician recommends that you continue on your current medications as directed. Please refer to the Current Medication list given to you today.  *If you need a refill on your cardiac medications before your next appointment, please call your pharmacy*   Testing/Procedures:  Your physician has requested that you have an echocardiogram. Echocardiography is a painless test that uses sound waves to create images of your heart. It provides your doctor with information about the size and shape of your heart and how well your heart's chambers and valves are working. This procedure takes approximately one hour. There are no restrictions for this procedure.  SCHEDULE ECHO TO BE DONE IN JULY 2024 PER DR. PJohney Frame Please do NOT wear cologne, perfume, aftershave, or lotions (deodorant is allowed). Please arrive 15 minutes prior to your appointment time.    Follow-Up:  3 MONTHS WITH AN EXTENDER IN THE OFFICE    Important Information About Sugar         Signed, HFreada Bergeron MD  03/28/2022 5:01 PM    [place]

## 2022-03-26 ENCOUNTER — Ambulatory Visit (HOSPITAL_COMMUNITY): Payer: HMO

## 2022-03-28 ENCOUNTER — Encounter: Payer: Self-pay | Admitting: Cardiology

## 2022-03-28 ENCOUNTER — Encounter: Payer: HMO | Attending: Cardiology | Admitting: Cardiology

## 2022-03-28 ENCOUNTER — Ambulatory Visit (HOSPITAL_COMMUNITY): Payer: HMO

## 2022-03-28 VITALS — BP 138/80 | HR 66 | Ht 70.0 in | Wt 163.2 lb

## 2022-03-28 DIAGNOSIS — I25118 Atherosclerotic heart disease of native coronary artery with other forms of angina pectoris: Secondary | ICD-10-CM | POA: Diagnosis not present

## 2022-03-28 DIAGNOSIS — I739 Peripheral vascular disease, unspecified: Secondary | ICD-10-CM

## 2022-03-28 DIAGNOSIS — I77819 Aortic ectasia, unspecified site: Secondary | ICD-10-CM

## 2022-03-28 DIAGNOSIS — I6523 Occlusion and stenosis of bilateral carotid arteries: Secondary | ICD-10-CM | POA: Diagnosis not present

## 2022-03-28 DIAGNOSIS — Z952 Presence of prosthetic heart valve: Secondary | ICD-10-CM | POA: Diagnosis not present

## 2022-03-28 DIAGNOSIS — E1165 Type 2 diabetes mellitus with hyperglycemia: Secondary | ICD-10-CM

## 2022-03-28 DIAGNOSIS — I498 Other specified cardiac arrhythmias: Secondary | ICD-10-CM

## 2022-03-28 DIAGNOSIS — I10 Essential (primary) hypertension: Secondary | ICD-10-CM | POA: Diagnosis not present

## 2022-03-28 MED ORDER — ROSUVASTATIN CALCIUM 40 MG PO TABS: 40.0000 mg | ORAL_TABLET | Freq: Every day | ORAL | 2 refills | Status: DC

## 2022-03-28 NOTE — Patient Instructions (Signed)
Medication Instructions:   Your physician recommends that you continue on your current medications as directed. Please refer to the Current Medication list given to you today.  *If you need a refill on your cardiac medications before your next appointment, please call your pharmacy*   Testing/Procedures:  Your physician has requested that you have an echocardiogram. Echocardiography is a painless test that uses sound waves to create images of your heart. It provides your doctor with information about the size and shape of your heart and how well your heart's chambers and valves are working. This procedure takes approximately one hour. There are no restrictions for this procedure. SCHEDULE ECHO TO BE DONE IN JULY 2024 PER DR. Johney Frame  Please do NOT wear cologne, perfume, aftershave, or lotions (deodorant is allowed). Please arrive 15 minutes prior to your appointment time.    Follow-Up:  3 MONTHS WITH AN EXTENDER IN THE OFFICE    Important Information About Sugar

## 2022-03-30 ENCOUNTER — Ambulatory Visit (HOSPITAL_COMMUNITY): Payer: HMO

## 2022-04-02 ENCOUNTER — Ambulatory Visit (HOSPITAL_COMMUNITY): Payer: HMO

## 2022-04-04 ENCOUNTER — Ambulatory Visit (HOSPITAL_COMMUNITY): Payer: HMO

## 2022-04-06 ENCOUNTER — Ambulatory Visit (HOSPITAL_COMMUNITY): Payer: HMO

## 2022-04-30 ENCOUNTER — Other Ambulatory Visit: Payer: Self-pay | Admitting: Gastroenterology

## 2022-04-30 ENCOUNTER — Ambulatory Visit
Admission: RE | Admit: 2022-04-30 | Discharge: 2022-04-30 | Disposition: A | Discharge status: Home / Self Care | Payer: HMO | Source: Ambulatory Visit | Attending: Gastroenterology | Admitting: Gastroenterology

## 2022-04-30 DIAGNOSIS — T182XXA Foreign body in stomach, initial encounter: Secondary | ICD-10-CM

## 2022-04-30 NOTE — Progress Notes (Unsigned)
HISTORY AND PHYSICAL     CC:  follow up. Requesting Provider:  Alroy Dust, [name], MD  HPI: This is a 71 y.o. male who is here today for follow up for PAD.  Pt has hx of with most recently left CFA endarterectomy with profundoplasty and endarterectomy of proximal SFA with bovine pericardial patch angioplasty and left CFA to BK popliteal bypass with PTFE 06/05/2021 Dr. Carlis Abbott. Pt also has hx of right CEA for asymptomatic carotid artery stenosis 08/24/2019 also by Dr. Carlis Abbott.   Vascular Hx: -left EIA stenting with angioplasty 06/18/2018 Dr. Carlis Abbott -right CEA (asymptomatic) 08/24/2019 Dr. Carlis Abbott -left SFA and AK popliteal atherectomy and angioplasty 12/02/2019 Dr. Carlis Abbott -left CFA endarterectomy with profundoplasty and endarterectomy of proximal SFA with bovine pericardial patch angioplasty and left CFA to BK popliteal bypass with PTFE 06/05/2021 Dr. Carlis Abbott - left CFA endarterectomy with profundoplasty and endarterectomy of proximal SFA with bovine pericardial patch angioplasty and left CFA to BK popliteal bypass with PTFE 06/05/2021 Dr. Carlis Abbott   In May 2023, pt underwent CABG x 3 by Dr. Kipp Brood.  His preoperative ABI and carotid duplex were done.  His left ABI was significantly improved and carotid duplex remained 1-39% bilateral ICA stenosis  Pt was last seen 02/13/2022 and at that time, he was doing well with palpable left PT pulse.  He did have some pitting edema and a mild compression sock was recommended with leg elevation and he was to continue his asa/statin/plavix.   He did have a stenosis at the profundoplasty. The bypass graft was patent without stenosis.He was scheduled for return in 3 months for close follow up for the profunda stenosis. He had started having weakness in the left leg that started about 2 weeks prior to our visit. He stated he has hx of back surgery x 3 in the past. He will f/u with ortho for back evaluation.  His previous carotid duplex revealed 1-39% bilateral ICA stenosis in May 2023.    The pt returns today for follow up.  He states that he is doing well.  He denies any claudication, rest pain or non healing wounds.  He states he gets some cramping in his thumbs.  He has hx of back issues and past surgery.  He has occasional pain in the left foot that jumps around.  He states that he does get some swelling in the left leg that comes and goes.  It helps when he wears his compression and elevates his leg.  He states his leg feels better when it is elevated.   He is compliant with his asa/plavix/statin  He has been married for 51 years.  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    The pt is on a statin for cholesterol management.    The pt is on an aspirin.    Other AC:  Plavix The pt is on ACEI for hypertension.  The pt does  have diabetes. Tobacco hx:  former  Pt does not have family hx of AAA.  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   Dysphagia    Esophageal dysmotility    GERD (gastroesophageal reflux disease)    Heart murmur    pt had recent  echocardiogram   History of hiatal hernia    History of Holter monitoring 2009   History of kidney stones    HNP (herniated nucleus pulposus), lumbar    L4-5   HYPERCHOLESTEROLEMIA 11/30/2009   Hyperlipidemia    HYPERTENSION 11/30/2009   Kidney stones    Numbness and tingling    Peripheral vascular disease ([place])    Right carotid bruit    SMOKER 11/30/2009   Stroke ([place]) 2012   URINARY CALCULUS 11/30/2009    Past Surgical History:  Procedure Laterality Date   ABDOMINAL AORTOGRAM W/LOWER EXTREMITY N/A 06/18/2018   Procedure: ABDOMINAL AORTOGRAM W/LOWER EXTREMITY;  Surgeon: Marty Heck, MD;  Location: [facility];  Service: Cardiovascular;  Laterality: N/A;   ABDOMINAL AORTOGRAM  W/LOWER EXTREMITY N/A 12/02/2019   Procedure: ABDOMINAL AORTOGRAM W/LOWER EXTREMITY;  Surgeon: Marty Heck, MD;  Location: [facility];  Service: Cardiovascular;  Laterality: N/A;   ABDOMINAL AORTOGRAM W/LOWER EXTREMITY N/A 03/23/2021   Procedure: ABDOMINAL AORTOGRAM W/LOWER EXTREMITY;  Surgeon: Marty Heck, MD;  Location: [facility];  Service: Cardiovascular;  Laterality: N/A;   AORTIC VALVE REPLACEMENT N/A 10/25/2021   Procedure: AORTIC VALVE REPLACEMENT (AVR)USING 23MM INSPIRIS RESILIA  AORTIC VALVE;  Surgeon: Lajuana Matte, MD;  Location: [facility];  Service: Open Heart Surgery;  Laterality: N/A;   arthroscopy of right shoulder Right    Jan 2019, June 2019-- done at [facility], Dr. Ninfa Linden   BACK SURGERY     CATARACT EXTRACTION W/ INTRAOCULAR LENS  IMPLANT, BILATERAL     CORONARY ARTERY BYPASS GRAFT N/A 10/25/2021   Procedure: CORONARY ARTERY BYPASS GRAFTING (CABG) X 3 USING LEFT INTERNAL MAMMARY ARTERY AND RIGHT GREATER SAPHENOUS VEIN;  Surgeon: Lajuana Matte, MD;  Location: [facility];  Service: Open Heart Surgery;  Laterality: N/A;   CYSTOSCOPY     ENDARTERECTOMY Right 08/24/2019   Procedure: ENDARTERECTOMY CAROTID;  Surgeon: Marty Heck, MD;  Location: [facility];  Service: Vascular;  Laterality: Right;   ENDARTERECTOMY FEMORAL Left 06/05/2021   Procedure: ENDARTERECTOMY FEMORAL WITH PROFUNDAPLASTY;  Surgeon: Marty Heck, MD;  Location: [facility];  Service: Vascular;  Laterality: Left;   ENDOVEIN HARVEST OF GREATER SAPHENOUS VEIN Right 10/25/2021   Procedure: ENDOVEIN HARVEST OF GREATER SAPHENOUS VEIN;  Surgeon: Lajuana Matte, MD;  Location: [facility];  Service: Open Heart Surgery;  Laterality: Right;   FACIAL COSMETIC SURGERY  2014   FEMORAL-POPLITEAL BYPASS GRAFT Left 06/05/2021   Procedure: LEFT FEMORAL-POPLITEAL ARTERY BYPASS GRAFTING USING 35m PROPATEN VASCULAR REMOVABLE RING GRAFT;  Surgeon: CMarty Heck MD;   [place] [facility]  Service: Vascular;  Laterality: Left;   FINGER SURGERY  2002   LEFT HEART CATH AND CORONARY ANGIOGRAPHY N/A 10/05/2021   Procedure: LEFT HEART CATH AND CORONARY ANGIOGRAPHY;  Surgeon: ENelva Bush MD;  [facility];  Service: Cardiovascular;  Laterality: N/A;   LUMBAR LAMINECTOMY/DECOMPRESSION MICRODISCECTOMY Left 08/01/2018   Procedure: Microdiscectomy - Lumbar four-Lumbar five - left;  Surgeon: JEustace Moore MD;  [place] [facility]  Service: Neurosurgery;  Laterality: Left;   LUMBAR LAMINECTOMY/DECOMPRESSION MICRODISCECTOMY Left 10/15/2018   Procedure: Re-do Microdiscectomy - left - Lumbar four-Lumbar five;  Surgeon: JEustace Moore MD;  [facility]  Service: Neurosurgery;  Laterality: Left;   NECK SURGERY  1986   PATCH ANGIOPLASTY Left 06/05/2021   Procedure: PATCH ANGIOPLASTY USING XRueben BashBIOLOGIC PATCH;  Surgeon: CMarty Heck MD;  [place] [facility]  Service: Vascular;  Laterality: Left;   PERIPHERAL VASCULAR ATHERECTOMY  12/02/2019   Procedure: PERIPHERAL VASCULAR ATHERECTOMY;  Surgeon: Marty Heck, MD;  Location: [facility];  Service: Cardiovascular;;  left SFA   PERIPHERAL VASCULAR INTERVENTION  06/18/2018   Procedure: PERIPHERAL VASCULAR INTERVENTION;  Surgeon: Marty Heck, MD;  Location: [facility];  Service: Cardiovascular;;  Left external iliac   ROTATOR CUFF REPAIR Left    TEE WITHOUT CARDIOVERSION N/A 10/25/2021   Procedure: TRANSESOPHAGEAL ECHOCARDIOGRAM (TEE);  Surgeon: Lajuana Matte, MD;  Location: [facility];  Service: Open Heart Surgery;  Laterality: N/A;   UMBILICAL HERNIA REPAIR      Allergies  Allergen Reactions   Codeine Nausea Only    Current Outpatient Medications  Medication Sig Dispense Refill   acetaminophen (TYLENOL) 500 MG tablet Take 1,000 mg by mouth every 6 (six) hours as needed for mild pain.     aspirin EC 81 MG EC tablet Take 1 tablet (81 mg total) by mouth daily at 6 (six)  AM.     benazepril (LOTENSIN) 20 MG tablet Take 1 tablet (20 mg total) by mouth daily. 90 tablet 3   clopidogrel (PLAVIX) 75 MG tablet TAKE 1 TABLET BY MOUTH EVERY DAY 90 tablet 2   Continuous Blood Gluc Sensor (FREESTYLE LIBRE 2 SENSOR) MISC AS DIRECTED FOR BLOOD SUGAR CHECK DX E11.42     ezetimibe (ZETIA) 10 MG tablet Take 1 tablet (10 mg total) by mouth daily. 90 tablet 3   insulin degludec (TRESIBA) 200 UNIT/ML FlexTouch Pen Inject 34 Units into the skin in the morning.     iron polysaccharides (NIFEREX) 150 MG capsule Take 1 capsule (150 mg total) by mouth daily. 30 capsule 0   metFORMIN (GLUCOPHAGE-XR) 500 MG 24 hr tablet Take 2 tablets (1,000 mg total) by mouth 2 (two) times daily.     ondansetron (ZOFRAN) 4 MG tablet Take 1 tablet (4 mg total) by mouth every 6 (six) hours as needed for nausea. (Patient not taking: Reported on 03/28/2022) 20 tablet 0   ONETOUCH VERIO test strip 1 each 3 (three) times daily.     pantoprazole (PROTONIX) 40 MG tablet Take 40 mg by mouth daily with breakfast.      pioglitazone (ACTOS) 45 MG tablet TAKE 1 TABLET DAILY (NEED APPOINTMENT FOR FURTHER REFILLS) 90 tablet 0   rosuvastatin (CRESTOR) 40 MG tablet Take 1 tablet (40 mg total) by mouth daily. 90 tablet 2   No current facility-administered medications for this visit.    Family History  Problem Relation Age of Onset   Diabetes Sister    Diabetes Mellitus I Sister    Stroke Father    Heart disease Father    CVA Father    Coronary artery disease Father    Diabetes Mother    Heart disease Mother    Diabetes Mellitus I Mother    Coronary artery disease Mother    Coronary artery disease Brother    Heart disease Brother    Coronary artery disease Brother     Social History   Socioeconomic History   Marital status: Married    Spouse name: [name]    Number of children: 3   Years of education: 11   Highest education level: Not on file  Occupational History   Occupation: Truck Consulting civil engineer: [employer]  Tobacco Use   Smoking status: Former    Years: 40.00    Types: Cigarettes  Quit date: 12/2015    Years since quitting: 6.3    Passive exposure: Current (Wife Is a smoker)   Smokeless tobacco: Never   Tobacco comments:    Quit 12-2012  Vaping Use   Vaping Use: Never used  Substance and Sexual Activity   Alcohol use: No   Drug use: No   Sexual activity: Not on file  Other Topics Concern   Not on file  Social History Narrative   . Patient drinks 2-3 cups of caffeine daily.    Patient lives at home with his wife [name].    Patient works at [employer].    Education. 12 th grade   Right handed               Social Determinants of Health   Financial Resource Strain: Not on file  Food Insecurity: No Food Insecurity (02/28/2022)   Hunger Vital Sign    Worried About Running Out of Food in the Last Year: Never true    Ran Out of Food in the Last Year: Never true  Transportation Needs: No Transportation Needs (02/28/2022)   PRAPARE - Hydrologist (Medical): No    Lack of Transportation (Non-Medical): No  Physical Activity: Not on file  Stress: Not on file  Social Connections: Not on file  Intimate Partner Violence: Not At Risk (02/28/2022)   Humiliation, Afraid, Rape, and Kick questionnaire    Fear of Current or Ex-Partner: No    Emotionally Abused: No    Physically Abused: No    Sexually Abused: No     REVIEW OF SYSTEMS:   '[X]'$  denotes positive finding, '[ ]'$  denotes negative finding Cardiac  Comments:  Chest pain or chest pressure:    Shortness of breath upon exertion:    Short of breath when lying flat:    Irregular heart rhythm:        Vascular    Pain in calf, thigh, or hip brought on by ambulation:    Pain in feet at night that wakes you up from your sleep:     Blood clot in your veins:    Leg swelling:         Pulmonary    Oxygen at home:    Productive cough:     Wheezing:         Neurologic    Sudden weakness in  arms or legs:     Sudden numbness in arms or legs:     Sudden onset of difficulty speaking or slurred speech:    Temporary loss of vision in one eye:     Problems with dizziness:         Gastrointestinal    Blood in stool:     Vomited blood:         Genitourinary    Burning when urinating:     Blood in urine:        Psychiatric    Major depression:         Hematologic    Bleeding problems:    Problems with blood clotting too easily:        Skin    Rashes or ulcers:        Constitutional    Fever or chills:      PHYSICAL EXAMINATION:  Today's Vitals   05/01/22 1345  BP: (!) 157/65  Pulse: 70  Resp: 20  Temp: 98 F (36.7 C)  TempSrc: Temporal  SpO2: 100%  Weight: 163  lb 4.8 oz (74.1 kg)  Height: '5\' 10"'$  (1.778 m)   Body mass index is 23.43 kg/m.   General:  WDWN in NAD; vital signs documented above Gait: Not observed HENT: WNL, normocephalic Pulmonary: normal non-labored breathing , without wheezing Cardiac: regular HR, with carotid bruit on the right Abdomen: soft, NT; aortic pulse is not palpable Skin: without rashes Vascular Exam/Pulses:  Right Left  Radial 2+ (normal) 2+ (normal)  Femoral 2+ (normal) 2+ (normal)  DP Brisk monophasic doppler Brisk biphasic doppler  PT Brisk monophasic doppler Brisk biphasic doppler  Peroneal Brisk biphasic doppler Brisk biphasic doppler   Extremities: without ischemic changes, without Gangrene , without cellulitis; without open wounds Musculoskeletal: no muscle wasting or atrophy  Neurologic: A&O X 3 Psychiatric:  The pt has Normal affect.   Non-Invasive Vascular Imaging:   ABI's/TBI's on 05/01/2022: Right:  0.62/0.55 - Great toe pressure: 87 Left:  0.94/0.69 - Great toe pressure: 110  Arterial duplex on 05/01/2022: Left Graft #1: Left Femoral-popliteal bypass graft  +--------------------+--------+---------------+--------+--------+                     PSV cm/sStenosis       WaveformComments   +--------------------+--------+---------------+--------+--------+  Inflow             186     30-49% stenosisbiphasic          +--------------------+--------+---------------+--------+--------+  Proximal Anastomosis156                    biphasic          +--------------------+--------+---------------+--------+--------+  Proximal Graft      77                     biphasic          +--------------------+--------+---------------+--------+--------+  Mid Graft           52                     biphasic          +--------------------+--------+---------------+--------+--------+  Distal Graft        48                     biphasic          +--------------------+--------+---------------+--------+--------+  Distal Anastomosis  73                     biphasic          +--------------------+--------+---------------+--------+--------+  Outflow            81                     biphasic          +--------------------+--------+---------------+--------+--------+   Summary:  Left: Patent Left femoral-popliteal bypass graft.   Previous ABI's/TBI's on 02/13/2022: Right:  0.73/0.51 - Great toe pressure: 91 Left:  1.01/0.72 - Great toe pressure:  128  Previous arterial duplex on 02/13/2022: +--------+--------+-----+---------------+---------+------------------------  LEFT    PSV cm/sRatioStenosis       Waveform Comments                  +--------+--------+-----+---------------+---------+------------------------  CFA Prox188                         biphasic                           +--------+--------+-----+---------------+---------+------------------------  DFA     346          50-74% stenosisbiphasic s/p profundoplasty, 50-99% restenosis                +--------+--------+-----+---------------+---------+------------------------  TP [id number]                         triphasic                           +--------+--------+-----+---------------+---------+------------------------    Left Graft #1: CFA to BK Popliteal Artery  +--------------------+--------+--------+---------+--------+                      PSV cm/sStenosisWaveform Comments  +--------------------+--------+--------+---------+--------+  Inflow              132             triphasic          +--------------------+--------+--------+---------+--------+  Proximal Anastomosis196             biphasic           +--------------------+--------+--------+---------+--------+  Proximal Graft      159             biphasic           +--------------------+--------+--------+---------+--------+  Mid Graft           61              biphasic           +--------------------+--------+--------+---------+--------+  Distal Graft        66              triphasic          +--------------------+--------+--------+---------+--------+  Distal Anastomosis  91              triphasic          +--------------------+--------+--------+---------+--------+  Outflow             121             triphasic          +--------------------+--------+--------+---------+--------+   Widely patent left common femoral artery to below-the-knee popliteal artery bypass graft without evidence of stenosis.   Summary:  Left: Marked improvement is noted compared to previous study.   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  Widely patent left common femoral artery to below-the-knee popliteal artery bypass graft without evidence of stenosis.     ASSESSMENT/PLAN:: 71 y.o. male here for follow up for PAD with hx of  Vascular Hx: -left EIA stenting with angioplasty 06/18/2018 Dr. Carlis Abbott -right CEA (asymptomatic) 08/24/2019 Dr. Carlis Abbott -left SFA and AK popliteal atherectomy and angioplasty 12/02/2019 Dr. Carlis Abbott -left CFA endarterectomy with profundoplasty and endarterectomy of proximal SFA with bovine pericardial patch angioplasty  and left CFA to BK popliteal bypass with PTFE 06/05/2021 Dr. Carlis Abbott - left CFA endarterectomy with profundoplasty and endarterectomy of proximal SFA with bovine pericardial patch angioplasty and left CFA to BK popliteal bypass with PTFE 06/05/2021 Dr. Carlis Abbott  PAD -pt doing well today with brisk doppler flow bilateral feet -he did have some decreased velocities within the graft - will bring him back in 6 months to repeat his ultrasound.  ABI slightly decreased from last visit but good toe pressure of 87.  -follow up in 6 months with repeat duplex and ABI to evaluate bypass graft given decreased velocities.  He did have a stenosis of  the profunda at the last visit that was not seen today.  Will also check this at next visit.    Carotid stenosis -pt with hx of right CEA-he did have carotid duplex in May that revealed 1-39% stenosis  -f/u in 6 months with carotid duplex when he comes for u/s of his leg  -continue asa/statin/plavix   Leontine Locket, [facility] [phone]  Clinic MD:   Carlis Abbott

## 2022-05-01 ENCOUNTER — Ambulatory Visit (INDEPENDENT_AMBULATORY_CARE_PROVIDER_SITE_OTHER)
Admission: RE | Admit: 2022-05-01 | Discharge: 2022-05-01 | Disposition: A | Discharge status: Home / Self Care | Payer: HMO | Source: Ambulatory Visit | Attending: Vascular Surgery | Admitting: Vascular Surgery

## 2022-05-01 ENCOUNTER — Ambulatory Visit: Payer: HMO | Admitting: Physician Assistant

## 2022-05-01 ENCOUNTER — Ambulatory Visit (HOSPITAL_COMMUNITY)
Admission: RE | Admit: 2022-05-01 | Discharge: 2022-05-01 | Disposition: A | Discharge status: Home / Self Care | Payer: HMO | Source: Ambulatory Visit | Attending: Vascular Surgery | Admitting: Vascular Surgery

## 2022-05-01 VITALS — BP 157/65 | HR 70 | Temp 98.0°F | Resp 20 | Ht 70.0 in | Wt 163.3 lb

## 2022-05-01 DIAGNOSIS — I739 Peripheral vascular disease, unspecified: Secondary | ICD-10-CM

## 2022-05-07 ENCOUNTER — Other Ambulatory Visit: Payer: Self-pay

## 2022-05-07 DIAGNOSIS — I6523 Occlusion and stenosis of bilateral carotid arteries: Secondary | ICD-10-CM

## 2022-05-07 DIAGNOSIS — I739 Peripheral vascular disease, unspecified: Secondary | ICD-10-CM

## 2022-05-09 ENCOUNTER — Other Ambulatory Visit: Payer: Self-pay | Admitting: Gastroenterology

## 2022-05-09 ENCOUNTER — Ambulatory Visit
Admission: RE | Admit: 2022-05-09 | Discharge: 2022-05-09 | Disposition: A | Discharge status: Home / Self Care | Payer: HMO | Source: Ambulatory Visit | Attending: Gastroenterology | Admitting: Gastroenterology

## 2022-05-09 DIAGNOSIS — T184XXA Foreign body in colon, initial encounter: Secondary | ICD-10-CM

## 2022-06-18 DIAGNOSIS — I10 Essential (primary) hypertension: Secondary | ICD-10-CM | POA: Diagnosis not present

## 2022-06-18 DIAGNOSIS — K219 Gastro-esophageal reflux disease without esophagitis: Secondary | ICD-10-CM | POA: Diagnosis not present

## 2022-06-18 DIAGNOSIS — E782 Mixed hyperlipidemia: Secondary | ICD-10-CM | POA: Diagnosis not present

## 2022-06-18 DIAGNOSIS — D649 Anemia, unspecified: Secondary | ICD-10-CM | POA: Diagnosis not present

## 2022-06-18 DIAGNOSIS — E1142 Type 2 diabetes mellitus with diabetic polyneuropathy: Secondary | ICD-10-CM | POA: Diagnosis not present

## 2022-06-22 DIAGNOSIS — Z1331 Encounter for screening for depression: Secondary | ICD-10-CM | POA: Diagnosis not present

## 2022-06-22 DIAGNOSIS — Z87891 Personal history of nicotine dependence: Secondary | ICD-10-CM | POA: Diagnosis not present

## 2022-06-22 DIAGNOSIS — I10 Essential (primary) hypertension: Secondary | ICD-10-CM | POA: Diagnosis not present

## 2022-06-22 DIAGNOSIS — D649 Anemia, unspecified: Secondary | ICD-10-CM | POA: Diagnosis not present

## 2022-06-22 DIAGNOSIS — D692 Other nonthrombocytopenic purpura: Secondary | ICD-10-CM | POA: Diagnosis not present

## 2022-06-30 NOTE — Progress Notes (Unsigned)
Cardiology Office Note:    Date:  07/02/2022   ID:  Luis Lindsey, DOB 02-10-1951, MRN [id number]  PCP:  Aurea Graff.[name], MD  [facility] Cardiologist:  Freada Bergeron, MD  [place] Electrophysiologist:  None   Referring MD: Alroy Dust, Carlean Jews.[name], MD    History of Present Illness:    Luis Lindsey is a 72 y.o. male with a hx of PAD s/p L SFA atherectomy with angioplasty, carotid artery disease s/p L CEA, HTN, HLD, moderate AS, and tobacco use who returns to clinic for follow-up.  Patient was initially seen in 03/2020 after having episodes of lightheadedness in the setting of anemia, moderate AS, and relative hypotension. We stopped his HCTZ and decreased his metop to '25mg'$  XL daily. Symptoms resolved. TTE 08/2019 with normal EF. Moderate aortic valve stenosis. Aortic valve area, by VTI measures 1.21 cm. Aortic valve mean gradient measures 14.5 mmHg. Aortic valve Vmax measures 2.76 m/s.   Had abdominal aortogram in 02/2021 with Dr. Carlis Abbott where he was found to have ostial high grade SFA stenosis. Was recommended for bypass at that time. He subsequently underwent  left common femoral endarterectomy with profundoplasty and endarterectomy of the proximal SFA with bovine pericardial patch angioplasty and left common femoral to below-knee popliteal artery bypass on 06/05/21 without issues. Was discharged on ASA and plavix.   Seen in clinic on 06/29/20 where he was doing well. Had stable exertional dyspnea but no chest pain. Repeat TTE 06/2020 with EF 60-65%, G1DD AVA 1.33, mean gradient 26mHg, Vmax 2.84.   Seen by me on 08/28/21 where he had episode of substernal chest pressure radiating down his left arm. Coronary CTA was ordered which revealed coronary artery calcium score 4204, 98th percentile for age and gender.  Aortic valve calcium score 1928, very heavy proximal to mid coronary calcification so difficult to quantify degree of stenosis.  Suspicion of severe ostial RCA stenosis as well.  Proximal LAD, ramus, and LCx appear moderately stenosed.  Possible severe mid LCx stenosis with FFR that revealed multiple hemodynamically significant stenoses. He was advised to return to our office for discussion of cardiac catheterization.   Was seen on 10/03/2021 by VRobbie Lis PA with worsening chest pain concerning for angina. Cardiac catheterization was scheduled for 10/05/2021 and revealed severe multivessel coronary disease including diffuse LMCA disease of up to 70% with pressure dampening of 34F diagnostic catheter, 90% distal LCx stenosis, and sequential 70-80% ostial, 40% proximal, 80% mid, and 50% distal RCA lesions. Widespread heavy calcification of the coronary arteries is evident, upper normal left ventricular filling pressure (LVEDP 15 mmHg), mild to moderate aortic valve stenosis (peak-to-peak gradient 21 mmHg). He was seen by CT surgery for CABG consultation. On 10/25/21 he underwent CABG x 3 utilizing LIMA to LAD, SVG to PDA, and SVG to OM1 and aortic valve replacement with a 23 mm Edwards Resilia Valve, and endoscopic harvest of greater saphenous vein from right leg. Beta blockers were held due to issues with junctional rhythm alternating with complete heart block but returned to normal sinus rhythm by POD 3. He was discharged on 10/31/21.  Was seen on 11/27/21 where he was having dizziness. He was in a junctional rhythm at that time. Cardiac monitor 12/01/21 showed 11 runs of nonsustained SVT with rare ectopy. TTE 12/05/21 with LVEF 55-60%, normal RV, mild LAE, trivial MR, well functionin 260mInpsiris Resilla valve with mean gradient 1039m, mild dilation of the ascending aorta 36m21m Was seen by Dr. CamnCurt Bears07/10/23.  Given that his cardiac monitor showed no recurrence of junctional bradycardia. PPM was deemed not needed at that time.  Was hospitalized 02/27/22-02/28/22 for significant anemia with hemoglobin 5.9. FOBT negative. UA without significant blood. Received 2units PRBCs and was  started on a PPI. He was discharged home with plans for GI follow-up. Has been taking iron pills. Repeat hemoglobin 10.2. Remained on DAPT.  Today, the patient overall feels well. Continues to struggle with neuropathy and feels like his left leg wants to give out with walking. Planned to see vascular in the next couple of months. Otherwise, no chest pain, SOB, orthopnea, PND, or significant LE edema. Tolerating medications as prescribed. Blood pressure has been running 150s at home. No orthostatic symptoms. Denies any melena or BRBPR. Last HgB had improved to 11.3 in 04/2022.   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   Dysphagia    Esophageal dysmotility    GERD (gastroesophageal reflux disease)    Heart murmur    pt had recent echocardiogram   History of hiatal hernia    History of Holter monitoring 2009   History of kidney stones    HNP (herniated nucleus pulposus), lumbar    L4-5   HYPERCHOLESTEROLEMIA 11/30/2009   Hyperlipidemia    HYPERTENSION 11/30/2009   Kidney stones    Numbness and tingling    Peripheral vascular disease ([place])    Right carotid bruit    SMOKER 11/30/2009   Stroke ([place]) 2012   URINARY CALCULUS 11/30/2009    Past Surgical History:  Procedure Laterality Date   ABDOMINAL AORTOGRAM W/LOWER EXTREMITY N/A 06/18/2018   Procedure: ABDOMINAL AORTOGRAM W/LOWER EXTREMITY;  Surgeon: Marty Heck, MD;  Location: [place] [facility];  Service: Cardiovascular;  Laterality: N/A;   ABDOMINAL AORTOGRAM W/LOWER EXTREMITY N/A 12/02/2019   Procedure: ABDOMINAL AORTOGRAM W/LOWER EXTREMITY;  Surgeon: Marty Heck, MD;  Location: [place] [facility];  Service: Cardiovascular;  Laterality: N/A;   ABDOMINAL AORTOGRAM W/LOWER EXTREMITY N/A 03/23/2021   Procedure: ABDOMINAL AORTOGRAM  W/LOWER EXTREMITY;  Surgeon: Marty Heck, MD;  Location: [place] [facility];  Service: Cardiovascular;  Laterality: N/A;   AORTIC VALVE REPLACEMENT N/A 10/25/2021   Procedure: AORTIC VALVE REPLACEMENT (AVR)USING 23MM INSPIRIS RESILIA  AORTIC VALVE;  Surgeon: Lajuana Matte, MD;  Location: [place];  Service: Open Heart Surgery;  Laterality: N/A;   arthroscopy of right shoulder Right    Jan 2019, June 2019-- done at [place], Dr. Ninfa Linden   BACK SURGERY     CATARACT EXTRACTION W/ INTRAOCULAR LENS  IMPLANT, BILATERAL     CORONARY ARTERY BYPASS GRAFT N/A 10/25/2021   Procedure: CORONARY ARTERY BYPASS GRAFTING (CABG) X 3 USING LEFT INTERNAL MAMMARY ARTERY AND RIGHT GREATER SAPHENOUS VEIN;  Surgeon: Lajuana Matte, MD;  Location: [place];  Service: Open Heart Surgery;  Laterality: N/A;   CYSTOSCOPY     ENDARTERECTOMY Right 08/24/2019   Procedure: ENDARTERECTOMY CAROTID;  Surgeon: Marty Heck, MD;  Location: [place];  Service: Vascular;  Laterality: Right;   ENDARTERECTOMY FEMORAL Left 06/05/2021   Procedure: ENDARTERECTOMY FEMORAL WITH PROFUNDAPLASTY;  Surgeon: Marty Heck, MD;  Location: [place];  Service: Vascular;  Laterality: Left;   ENDOVEIN HARVEST OF GREATER  SAPHENOUS VEIN Right 10/25/2021   Procedure: ENDOVEIN HARVEST OF GREATER SAPHENOUS VEIN;  Surgeon: Lajuana Matte, MD;  Location: [place];  Service: Open Heart Surgery;  Laterality: Right;   FACIAL COSMETIC SURGERY  2014   FEMORAL-POPLITEAL BYPASS GRAFT Left 06/05/2021   Procedure: LEFT FEMORAL-POPLITEAL ARTERY BYPASS GRAFTING USING 20m PROPATEN VASCULAR REMOVABLE RING GRAFT;  Surgeon: CMarty Heck MD;  [place]  Service: Vascular;  Laterality: Left;   FINGER SURGERY  2002   LEFT HEART CATH AND CORONARY ANGIOGRAPHY N/A 10/05/2021   Procedure: LEFT HEART CATH AND CORONARY ANGIOGRAPHY;  Surgeon: ENelva Bush MD;  [place] [place];  Service: Cardiovascular;   Laterality: N/A;   LUMBAR LAMINECTOMY/DECOMPRESSION MICRODISCECTOMY Left 08/01/2018   Procedure: Microdiscectomy - Lumbar four-Lumbar five - left;  Surgeon: JEustace Moore MD;  [place]  Service: Neurosurgery;  Laterality: Left;   LUMBAR LAMINECTOMY/DECOMPRESSION MICRODISCECTOMY Left 10/15/2018   Procedure: Re-do Microdiscectomy - left - Lumbar four-Lumbar five;  Surgeon: JEustace Moore MD;  [place]  Service: Neurosurgery;  Laterality: Left;   NECK SURGERY  1986   PATCH ANGIOPLASTY Left 06/05/2021   Procedure: PATCH ANGIOPLASTY USING XRueben BashBIOLOGIC PATCH;  Surgeon: CMarty Heck MD;  [place]  Service: Vascular;  Laterality: Left;   PERIPHERAL VASCULAR ATHERECTOMY  12/02/2019   Procedure: PERIPHERAL VASCULAR ATHERECTOMY;  Surgeon: CMarty Heck MD;  [place];  Service: Cardiovascular;;  left SFA   PERIPHERAL VASCULAR INTERVENTION  06/18/2018   Procedure: PERIPHERAL VASCULAR INTERVENTION;  Surgeon: CMarty Heck MD;  [facility];  Service: Cardiovascular;;  Left external iliac   ROTATOR CUFF REPAIR Left    TEE WITHOUT CARDIOVERSION N/A 10/25/2021   Procedure: TRANSESOPHAGEAL ECHOCARDIOGRAM (TEE);  Surgeon: LLajuana Matte MD;  [place]  Service: Open Heart Surgery;  Laterality: N/A;   UMBILICAL HERNIA REPAIR      Current Medications: Current Meds  Medication Sig   acetaminophen (TYLENOL) 500 MG tablet Take 1,000 mg by mouth every 6 (six) hours as needed for mild pain.   amLODipine (NORVASC) 5 MG tablet Take 1 tablet (5 mg total) by mouth daily.   aspirin EC 81 MG EC tablet Take 1 tablet (81 mg total) by mouth daily at 6 (six) AM.   benazepril (LOTENSIN) 20 MG tablet Take 1 tablet (20 mg total) by mouth daily.   clopidogrel (PLAVIX) 75 MG tablet TAKE 1 TABLET BY MOUTH EVERY DAY   Continuous Blood Gluc Sensor (FREESTYLE LIBRE 2 SENSOR) MISC AS DIRECTED FOR BLOOD SUGAR CHECK DX E11.42   insulin  aspart (NOVOLOG FLEXPEN) 100 UNIT/ML FlexPen Inject 100 Units into the skin in the morning, at noon, and at bedtime.   insulin degludec (TRESIBA) 200 UNIT/ML FlexTouch Pen Inject 34 Units into the skin in the morning.   iron polysaccharides (NIFEREX) 150 MG capsule Take 1 capsule (150 mg total) by mouth daily.   metFORMIN (GLUCOPHAGE-XR) 500 MG 24 hr tablet Take 2 tablets (1,000 mg total) by mouth 2 (two) times daily.   ONETOUCH VERIO test strip 1 each 3 (three) times daily.   pantoprazole (PROTONIX) 40 MG tablet Take 40 mg by mouth daily with breakfast.    pioglitazone (ACTOS) 45 MG tablet TAKE 1 TABLET DAILY (NEED APPOINTMENT FOR FURTHER REFILLS)   rosuvastatin (CRESTOR) 40 MG tablet Take 1 tablet (40 mg total) by mouth daily.   [DISCONTINUED] ezetimibe (ZETIA) 10 MG tablet Take 1 tablet (10  mg total) by mouth daily.   [DISCONTINUED] ondansetron (ZOFRAN) 4 MG tablet Take 1 tablet (4 mg total) by mouth every 6 (six) hours as needed for nausea.     Allergies:   Codeine   Social History   Socioeconomic History   Marital status: Married    Spouse name: [name]    Number of children: 3   Years of education: 11   Highest education level: Not on file  Occupational History   Occupation: Truck Education administrator: [employer]  Tobacco Use   Smoking status: Former    Years: 40.00    Types: Cigarettes    Quit date: 12/2015    Years since quitting: 6.5    Passive exposure: Current (Wife Is a smoker)   Smokeless tobacco: Never   Tobacco comments:    Quit 12-2012  Vaping Use   Vaping Use: Never used  Substance and Sexual Activity   Alcohol use: No   Drug use: No   Sexual activity: Not on file  Other Topics Concern   Not on file  Social History Narrative   . Patient drinks 2-3 cups of caffeine daily.    Patient lives at home with his wife [name].    Patient works at [employer].    Education. 12 th grade   Right handed               Social Determinants of Health   Financial Resource  Strain: Not on file  Food Insecurity: No Food Insecurity (02/28/2022)   Hunger Vital Sign    Worried About Running Out of Food in the Last Year: Never true    Ran Out of Food in the Last Year: Never true  Transportation Needs: No Transportation Needs (02/28/2022)   PRAPARE - Hydrologist (Medical): No    Lack of Transportation (Non-Medical): No  Physical Activity: Not on file  Stress: Not on file  Social Connections: Not on file     Family History: The patient's family history includes CVA in his father; Coronary artery disease in his brother, brother, father, and mother; Diabetes in his mother and sister; Diabetes Mellitus I in his mother and sister; Heart disease in his brother, father, and mother; Stroke in his father.  ROS:   Please see the history of present illness.    Review of Systems  Constitutional:  Negative for chills, fever and malaise/fatigue.  HENT:  Negative for nosebleeds.   Respiratory:  Positive for shortness of breath.   Cardiovascular:  Negative for chest pain, palpitations, orthopnea, claudication, leg swelling and PND.  Gastrointestinal:  Negative for nausea and vomiting.  Genitourinary:  Negative for hematuria.  Musculoskeletal:  Positive for myalgias. Negative for falls.  Neurological:  Negative for dizziness and loss of consciousness.  Endo/Heme/Allergies:  Negative for polydipsia.  Psychiatric/Behavioral:  Negative for substance abuse.     EKGs/Labs/Other Studies Reviewed:    The following studies were reviewed today: TTE December 06, 2021: IMPRESSIONS     1. Compared with echo 08/2021, there is now a well-functioning  bioprosthetic valve in the aortic position.   2. Left ventricular ejection fraction, by estimation, is 55 to 60%. The  left ventricle has normal function. The left ventricle has no regional  wall motion abnormalities. There is mild concentric left ventricular  hypertrophy. Left ventricular diastolic  parameters  were normal. The average left ventricular global longitudinal  strain is -23.0 %. The global longitudinal strain is normal.   3. Right  ventricular systolic function is normal. The right ventricular  size is normal. There is normal pulmonary artery systolic pressure.   4. Left atrial size was mildly dilated.   5. The mitral valve is normal in structure. Trivial mitral valve  regurgitation. No evidence of mitral stenosis.   6. The aortic valve has been repaired/replaced. Aortic valve  regurgitation is not visualized. No aortic stenosis is present. There is a  23 mm Inpsiris Resilia valve present in the aortic position. Procedure  Date: 10/25/21. Echo findings are consistent  with normal structure and function of the aortic valve prosthesis. Aortic  valve mean gradient measures 10.0 mmHg. Aortic valve Vmax measures 2.12  m/s.   7. Aortic dilatation noted. There is mild dilatation of the ascending  aorta, measuring 38 mm.   8. The inferior vena cava is normal in size with greater than 50%  respiratory variability, suggesting right atrial pressure of 3 mmHg.   Comparison(s): TEE 10/25/21 EF 60-65%. Mild AI. Moderate AS.  Cardiac Monitor 12/01/21: Patch wear time was 6 days and 22 hours Predominant rhythm was NSR with average HR 71bpm There were 11 runs of nonsustained SVT with longest lasting 5 beats Rare SVE (<1%), rare VE (<1%) Patient triggered events correlate with NSR and NSR with PVCs No Afib, junctional rhythms or significant pauses noted     Patch Wear Time:  6 days and 22 hours (2023-06-22T09:20:00-0400 to 2023-06-29T08:06:25-0400)   Patient had a min HR of 55 bpm, max HR of 141 bpm, and avg HR of 71 bpm. Predominant underlying rhythm was Sinus Rhythm. 11 Supraventricular Tachycardia runs occurred, the run with the fastest interval lasting 4 beats with a max rate of 141 bpm, the  longest lasting 5 beats with an avg rate of 114 bpm. Isolated SVEs were rare (<1.0%), SVE Couplets were  rare (<1.0%), and SVE Triplets were rare (<1.0%). Isolated VEs were rare (<1.0%), and no VE Couplets or VE Triplets were present.   LHC 10/05/21   Severe multivessel coronary disease, including diffuse LMCA disease of up to 70% with pressure dampening of 32F diagnostic catheter, 90% distal LCx stenosis, and sequential 70-80% ostial, 40% proximal, 80% mid, and 50% distal RCA lesions.  Widespread heavy calcification of the coronary arteries is evident. Upper normal left ventricular filling pressure (LVEDP 15 mmHg). Mild-moderate aortic valve stenosis (peak-to-peak gradient 21 mmHg).   Recommendations: Outpatient cardiac surgery consultation for CABG. Aggressive secondary prevention of coronary artery disease. Continue dual antiplatelet therapy with aspirin and clopidogrel pending cardiac surgery evaluation and input from vascular surgery regarding interruption of clopidogrel in the setting of lower extremity interventions earlier this year   Coronary CT 09/27/21   1. Coronary artery calcium score 4204 Agatston units. This places the patient in the [place] percentile for age and gender.   2.  Aortic valve calcium score 1928 Agatston units.   3. Very heavy proximal-mid coronary calcification so difficult to quantify degree of stenosis. I suspect that there is severe ostial RCA stenosis. Proximal LAD, ramus, and LCx appear moderately stenosed. Possible severe mid LCx stenosis. Will send for FFR.   Echo 09/12/21    1. Mild to moderate AS (mean gradient 13 mmHg; AVA 1.5 cm2); gradient  slightly less on present study compared to 07/18/20.   2. Left ventricular ejection fraction, by estimation, is 60 to 65%. The  left ventricle has normal function. The left ventricle has no regional  wall motion abnormalities. Left ventricular diastolic parameters are  consistent with Grade I  diastolic  dysfunction (impaired relaxation). The average left ventricular global  longitudinal strain is -20.9 %. The global  longitudinal strain is normal.   3. Right ventricular systolic function is normal. The right ventricular  size is normal. Tricuspid regurgitation signal is inadequate for assessing  PA pressure.   4. The mitral valve is normal in structure. Trivial mitral valve  regurgitation. No evidence of mitral stenosis.   5. The aortic valve is tricuspid. Aortic valve regurgitation is not  visualized. Mild to moderate aortic valve stenosis.   6. The inferior vena cava is normal in size with greater than 50%  respiratory variability, suggesting right atrial pressure of 3 mmHg.    EKG:  EKG is personally reviewed. No new tracing today    Recent Labs: 02/28/2022: ALT 39; BUN 10; Creatinine, Ser 0.84; Hemoglobin 9.2; Magnesium 2.1; Platelets 295; Potassium 2.8; Sodium 139   Recent Lipid Panel    Component Value Date/Time   CHOL 94 06/06/2021 0329   TRIG 59 06/06/2021 0329   HDL 39 (L) 06/06/2021 0329   CHOLHDL 2.4 06/06/2021 0329   VLDL 12 06/06/2021 0329   LDLCALC 43 06/06/2021 0329     Physical Exam:    VS:  BP (!) 159/99   Pulse 64   Ht '5\' 10"'$  (1.778 m)   Wt 167 lb 6.4 oz (75.9 kg)   SpO2 99%   BMI 24.02 kg/m     Wt Readings from Last 3 Encounters:  07/02/22 167 lb 6.4 oz (75.9 kg)  05/01/22 163 lb 4.8 oz (74.1 kg)  03/28/22 163 lb 3.2 oz (74 kg)     GEN:  Comfortable, NAD HEENT: Normal NECK: No JVD; no carotid bruits CARDIAC: RRR, 2/6 systolic murmur RESPIRATORY:  Clear to auscultation without rales, wheezing or rhonchi  ABDOMEN: Soft, non-tender, non-distended MUSCULOSKELETAL:  No edema; No deformity  SKIN: Warm and dry NEUROLOGIC:  Alert and oriented x 3 PSYCHIATRIC:  Normal affect   ASSESSMENT:    1. Coronary artery disease of native artery of native heart with stable angina pectoris ([place])   2. S/P AVR (aortic valve replacement)   3. PVD (peripheral vascular disease) ([facility])   4. Junctional cardiac arrhythmia   5. Aortic dilatation ([facility])   6. PAD (peripheral artery  disease) ([place])   7. Essential hypertension     PLAN:    In order of problems listed above:  #Coronary Artery Disease s/p CABG x3: Cath 10/05/21 with severe multivessel coronary disease including diffuse LMCA disease of up to 70% with pressure dampening of 28F diagnostic catheter, 90% distal LCx stenosis, and sequential 70-80% ostial, 40% proximal, 80% mid, and 50% distal RCA lesions. Widespread heavy calcification of the coronary arteries is evident, upper normal left ventricular filling pressure (LVEDP 15 mmHg), mild to moderate aortic valve stenosis (peak-to-peak gradient 21 mmHg).He ultimately underwent CABGx3 on 10/25/21  utilizing LIMA to LAD, SVG to PDA, and SVG to OM1 and aortic valve replacement with a 23 mm Edwards Resilia Valve. TTE 11/2021 with LVEF 55-60%, normal functioning aortic valve prosthesis with mean gradient 65mHg.  Currently doing well without anginal symptoms. -Continue ASA '81mg'$  daily -Continue plavix '75mg'$  daily  -Continue crestor '40mg'$  daily, zetia '10mg'$  daily -Not on BB due to post-op CHB and episode of junctional arrhythmia in clinic  #Moderate AS s/p AVR at time of CABG: S/p aortic valve replacement with a 23 mm Edwards Resilia Valve at time of CABG as detailed above. TTE 11/2021 with well functioning prosthesis with mean gradient  24mHg.  -Continue serial monitoring  #Iron Deficiency Anemia: Hospitalized in 02/2022 for HgB 5.9. FOBT negative and UA negative but overall concern was for slow GI bleed in the setting of DAPT. [facility] PPI with significant improvement. Last HgB 11.5 in 04/2022. -Follow-up GI  -Continue PPI -Continue iron -Will continue DAPT for now given stable HgB  #PAD: S/p left common femoral endarterectomy with bovine patch angioplasty and left common femoral to below the knee popliteal artery bypass with PTFE by Dr. CCarlis Abbotton 06/05/2021. Claudication symptoms improved.  -Continue ASA '81mg'$  daily and plavix '75mg'$   daily -Continue PPI -Continue rosuvastatin '40mg'$  daily   #Carotid artery stenosis s/p L CEA: -Continue rosuvastatin '40mg'$  daily -Continue ASA '81mg'$ , plavix '75mg'$  daily  #Junctional Rhythm: Patient developed junctional rhythm alternating with complete heart block following AVR. This resolved but then recurred on 11/16/21. Cardiac monitor showed no recurrence of the rhythm fortunately and he was in NSR at follow-up with Dr. CCurt Bears Will continue to monitor at this time. -Will continue to monitor -Holding nodal agents   #HTN: #Concern for orthostasis: Significantly improved with stopping HCTZ. Given SBP mainly 150s at home, will resume amlodipine '5mg'$  daily and monitor response.  -Stopped HCTZ -Will restart amlodipine '5mg'$  daily given SBP in 150s -Continue benzapril '20mg'$  daily -Monitor BP at home   #HLD: -Continue rosuvastatin '40mg'$  daily -Continue zetia '10mg'$  daily -Repeat lipid panel  #DMII: -Referred to Endocrine -Currently on insulin  Follow-up:  627month Medication Adjustments/Labs and Tests Ordered: Current medicines are reviewed at length with the patient today.  Concerns regarding medicines are outlined above.  Orders Placed This Encounter  Procedures   Lipid Profile   Meds ordered this encounter  Medications   amLODipine (NORVASC) 5 MG tablet    Sig: Take 1 tablet (5 mg total) by mouth daily.    Dispense:  90 tablet    Refill:  2    Patient Instructions  Medication Instructions:   START TAKING AMLODIPINE 5 MG BY MOUTH DAILY  *If you need a refill on your cardiac medications before your next appointment, please call your pharmacy*   Lab Work:  TODAY--LIPIDS  If you have labs (blood work) drawn today and your tests are completely normal, you will receive your results only by: MyPepeekeoif you have MyChart) OR A paper copy in the mail If you have any lab test that is abnormal or we need to change your treatment, we will call you to review the  results.    Follow-Up: [facility].  As part of our continuing mission to provide you with exceptional heart care, we have created designated Provider Care Teams.  These Care Teams include your primary Cardiologist (physician) and Advanced Practice Providers (APPs -  Physician Assistants and Nurse Practitioners) who all work together to provide you with the care you need, when you need it.  We recommend signing up for the patient portal called "MyChart".  Sign up information is provided on this After Visit Summary.  MyChart is used to connect with patients for Virtual Visits (Telemedicine).  Patients are able to view lab/test results, encounter notes, upcoming appointments, etc.  Non-urgent messages can be sent to your provider as well.   To learn more about what you can do with MyChart, go to [email or web address]   Your next appointment:   6 month(s)  Provider:   HeFreada BergeronMD  Signed, Freada Bergeron, MD  07/02/2022 12:12 PM    [place]

## 2022-07-02 ENCOUNTER — Ambulatory Visit: Payer: PPO | Attending: Cardiology | Admitting: Cardiology

## 2022-07-02 ENCOUNTER — Encounter: Payer: Self-pay | Admitting: Cardiology

## 2022-07-02 VITALS — BP 159/99 | HR 64 | Ht 70.0 in | Wt 167.4 lb

## 2022-07-02 DIAGNOSIS — I10 Essential (primary) hypertension: Secondary | ICD-10-CM

## 2022-07-02 DIAGNOSIS — I25118 Atherosclerotic heart disease of native coronary artery with other forms of angina pectoris: Secondary | ICD-10-CM | POA: Diagnosis not present

## 2022-07-02 DIAGNOSIS — I498 Other specified cardiac arrhythmias: Secondary | ICD-10-CM | POA: Diagnosis not present

## 2022-07-02 DIAGNOSIS — Z952 Presence of prosthetic heart valve: Secondary | ICD-10-CM

## 2022-07-02 DIAGNOSIS — I77819 Aortic ectasia, unspecified site: Secondary | ICD-10-CM

## 2022-07-02 DIAGNOSIS — I739 Peripheral vascular disease, unspecified: Secondary | ICD-10-CM | POA: Diagnosis not present

## 2022-07-02 MED ORDER — AMLODIPINE BESYLATE 5 MG PO TABS: 5.0000 mg | ORAL_TABLET | Freq: Every day | ORAL | 2 refills | Status: DC

## 2022-07-02 NOTE — Patient Instructions (Signed)
Medication Instructions:   START TAKING AMLODIPINE 5 MG BY MOUTH DAILY  *If you need a refill on your cardiac medications before your next appointment, please call your pharmacy*   Lab Work:  TODAY--LIPIDS  If you have labs (blood work) drawn today and your tests are completely normal, you will receive your results only by: [place] (if you have MyChart) OR A paper copy in the mail If you have any lab test that is abnormal or we need to change your treatment, we will call you to review the results.    Follow-Up: At [facility], you and your health needs are our priority.  As part of our continuing mission to provide you with exceptional heart care, we have created designated Provider Care Teams.  These Care Teams include your primary Cardiologist (physician) and Advanced Practice Providers (APPs -  Physician Assistants and Nurse Practitioners) who all work together to provide you with the care you need, when you need it.  We recommend signing up for the patient portal called "MyChart".  Sign up information is provided on this After Visit Summary.  MyChart is used to connect with patients for Virtual Visits (Telemedicine).  Patients are able to view lab/test results, encounter notes, upcoming appointments, etc.  Non-urgent messages can be sent to your provider as well.   To learn more about what you can do with MyChart, go to [email or web address].    Your next appointment:   6 month(s)  Provider:   Freada Bergeron, MD

## 2022-07-03 ENCOUNTER — Telehealth: Payer: Self-pay | Admitting: *Deleted

## 2022-07-03 DIAGNOSIS — Z79899 Other long term (current) drug therapy: Secondary | ICD-10-CM

## 2022-07-03 DIAGNOSIS — I25118 Atherosclerotic heart disease of native coronary artery with other forms of angina pectoris: Secondary | ICD-10-CM

## 2022-07-03 DIAGNOSIS — E782 Mixed hyperlipidemia: Secondary | ICD-10-CM

## 2022-07-03 LAB — LIPID PANEL
Chol/HDL Ratio: 2.7 ratio (ref 0.0–5.0)
Cholesterol, Total: 134 mg/dL (ref 100–199)
HDL: 49 mg/dL (ref >39)
LDL Chol Calc (NIH): 72 mg/dL (ref 0–99)
Triglycerides: 61 mg/dL (ref 0–149)
VLDL Cholesterol Cal: 13 mg/dL (ref 5–40)

## 2022-07-03 MED ORDER — EZETIMIBE 10 MG PO TABS: 10.0000 mg | ORAL_TABLET | Freq: Every day | ORAL | 1 refills | Status: DC

## 2022-07-03 NOTE — Telephone Encounter (Signed)
The patient has been notified of the result and verbalized understanding.  All questions (if any) were answered.  Pt aware to start taking zetia 10 mg po daily and come back in for repeat lipids in 8 weeks.   Confirmed the pharmacy of choice with the pt.   Scheduled the pt for repeat lipids in 8 weeks on 08/28/22.  He is aware to come fasting to this lab appt.   Pt verbalized understanding and agrees with this plan.

## 2022-07-03 NOTE — Telephone Encounter (Signed)
-----   Message from Freada Bergeron, MD sent at 07/03/2022 11:27 AM EST ----- Cholesterol is slightly above goal. Given the extent of his artery disease, can we add zetia '10mg'$  dialy and repeat lipids in 8 weeks? Ideally, would have LDL<55

## 2022-07-24 DIAGNOSIS — E782 Mixed hyperlipidemia: Secondary | ICD-10-CM | POA: Diagnosis not present

## 2022-07-24 DIAGNOSIS — I10 Essential (primary) hypertension: Secondary | ICD-10-CM | POA: Diagnosis not present

## 2022-07-24 DIAGNOSIS — K219 Gastro-esophageal reflux disease without esophagitis: Secondary | ICD-10-CM | POA: Diagnosis not present

## 2022-07-24 DIAGNOSIS — E1142 Type 2 diabetes mellitus with diabetic polyneuropathy: Secondary | ICD-10-CM | POA: Diagnosis not present

## 2022-08-06 DIAGNOSIS — Z9181 History of falling: Secondary | ICD-10-CM | POA: Diagnosis not present

## 2022-08-06 DIAGNOSIS — Z794 Long term (current) use of insulin: Secondary | ICD-10-CM | POA: Diagnosis not present

## 2022-08-06 DIAGNOSIS — Z0000 Encounter for general adult medical examination without abnormal findings: Secondary | ICD-10-CM | POA: Diagnosis not present

## 2022-08-06 DIAGNOSIS — E1165 Type 2 diabetes mellitus with hyperglycemia: Secondary | ICD-10-CM | POA: Diagnosis not present

## 2022-08-06 DIAGNOSIS — I442 Atrioventricular block, complete: Secondary | ICD-10-CM | POA: Diagnosis not present

## 2022-08-06 DIAGNOSIS — E1142 Type 2 diabetes mellitus with diabetic polyneuropathy: Secondary | ICD-10-CM | POA: Diagnosis not present

## 2022-08-06 DIAGNOSIS — I25118 Atherosclerotic heart disease of native coronary artery with other forms of angina pectoris: Secondary | ICD-10-CM | POA: Diagnosis not present

## 2022-08-06 DIAGNOSIS — K219 Gastro-esophageal reflux disease without esophagitis: Secondary | ICD-10-CM | POA: Diagnosis not present

## 2022-08-06 DIAGNOSIS — D649 Anemia, unspecified: Secondary | ICD-10-CM | POA: Diagnosis not present

## 2022-08-06 DIAGNOSIS — E782 Mixed hyperlipidemia: Secondary | ICD-10-CM | POA: Diagnosis not present

## 2022-08-06 DIAGNOSIS — E1121 Type 2 diabetes mellitus with diabetic nephropathy: Secondary | ICD-10-CM | POA: Diagnosis not present

## 2022-08-06 DIAGNOSIS — E1151 Type 2 diabetes mellitus with diabetic peripheral angiopathy without gangrene: Secondary | ICD-10-CM | POA: Diagnosis not present

## 2022-08-06 DIAGNOSIS — I10 Essential (primary) hypertension: Secondary | ICD-10-CM | POA: Diagnosis not present

## 2022-08-16 ENCOUNTER — Other Ambulatory Visit: Payer: Self-pay | Admitting: Family Medicine

## 2022-08-16 DIAGNOSIS — Z136 Encounter for screening for cardiovascular disorders: Secondary | ICD-10-CM

## 2022-08-16 DIAGNOSIS — Z87891 Personal history of nicotine dependence: Secondary | ICD-10-CM

## 2022-08-20 DIAGNOSIS — E782 Mixed hyperlipidemia: Secondary | ICD-10-CM | POA: Diagnosis not present

## 2022-08-20 DIAGNOSIS — I10 Essential (primary) hypertension: Secondary | ICD-10-CM | POA: Diagnosis not present

## 2022-08-20 DIAGNOSIS — K219 Gastro-esophageal reflux disease without esophagitis: Secondary | ICD-10-CM | POA: Diagnosis not present

## 2022-08-20 DIAGNOSIS — D509 Iron deficiency anemia, unspecified: Secondary | ICD-10-CM | POA: Diagnosis not present

## 2022-08-20 DIAGNOSIS — E1142 Type 2 diabetes mellitus with diabetic polyneuropathy: Secondary | ICD-10-CM | POA: Diagnosis not present

## 2022-08-23 ENCOUNTER — Ambulatory Visit
Admission: RE | Admit: 2022-08-23 | Discharge: 2022-08-23 | Disposition: A | Discharge status: Home / Self Care | Payer: PPO | Source: Ambulatory Visit | Attending: Family Medicine | Admitting: Family Medicine

## 2022-08-23 DIAGNOSIS — Z87891 Personal history of nicotine dependence: Secondary | ICD-10-CM

## 2022-08-23 DIAGNOSIS — F17210 Nicotine dependence, cigarettes, uncomplicated: Secondary | ICD-10-CM | POA: Diagnosis not present

## 2022-08-23 DIAGNOSIS — Z136 Encounter for screening for cardiovascular disorders: Secondary | ICD-10-CM | POA: Diagnosis not present

## 2022-08-25 ENCOUNTER — Other Ambulatory Visit: Payer: Self-pay | Admitting: Vascular Surgery

## 2022-08-28 ENCOUNTER — Ambulatory Visit: Payer: PPO | Attending: Cardiology

## 2022-08-28 DIAGNOSIS — E782 Mixed hyperlipidemia: Secondary | ICD-10-CM | POA: Diagnosis not present

## 2022-08-28 DIAGNOSIS — I25118 Atherosclerotic heart disease of native coronary artery with other forms of angina pectoris: Secondary | ICD-10-CM | POA: Diagnosis not present

## 2022-08-28 DIAGNOSIS — Z79899 Other long term (current) drug therapy: Secondary | ICD-10-CM | POA: Diagnosis not present

## 2022-08-29 LAB — LIPID PANEL
Chol/HDL Ratio: 1.8 ratio (ref 0.0–5.0)
Cholesterol, Total: 97 mg/dL — ABNORMAL LOW (ref 100–199)
HDL: 53 mg/dL (ref >39)
LDL Chol Calc (NIH): 28 mg/dL (ref 0–99)
Triglycerides: 74 mg/dL (ref 0–149)
VLDL Cholesterol Cal: 16 mg/dL (ref 5–40)

## 2022-08-31 DIAGNOSIS — E1151 Type 2 diabetes mellitus with diabetic peripheral angiopathy without gangrene: Secondary | ICD-10-CM | POA: Diagnosis not present

## 2022-08-31 DIAGNOSIS — Z794 Long term (current) use of insulin: Secondary | ICD-10-CM | POA: Diagnosis not present

## 2022-08-31 DIAGNOSIS — R251 Tremor, unspecified: Secondary | ICD-10-CM | POA: Diagnosis not present

## 2022-08-31 DIAGNOSIS — M79672 Pain in left foot: Secondary | ICD-10-CM | POA: Diagnosis not present

## 2022-08-31 DIAGNOSIS — E1142 Type 2 diabetes mellitus with diabetic polyneuropathy: Secondary | ICD-10-CM | POA: Diagnosis not present

## 2022-09-04 ENCOUNTER — Telehealth: Payer: Self-pay | Admitting: Neurology

## 2022-09-04 NOTE — Telephone Encounter (Signed)
Patient of Dr. Zannie Cove being referred back to [place] for neuropathy and tremor. Patient stated he does not want to return to Dr. Terrace Arabia and did not give any specific reasoning why. Patient would like a provider switch to Dr. Lucia Gaskins. Please advise if this is acceptable, thank you

## 2022-09-04 NOTE — Telephone Encounter (Signed)
After reviewing his chart I am not sure there is much more I can do fo rhim, Dr Terrace Arabia has been incredibly thorough. [facility]

## 2022-09-04 NOTE — Telephone Encounter (Signed)
Ok to see Dr. Ahern 

## 2022-09-06 DIAGNOSIS — D509 Iron deficiency anemia, unspecified: Secondary | ICD-10-CM | POA: Diagnosis not present

## 2022-09-06 DIAGNOSIS — E782 Mixed hyperlipidemia: Secondary | ICD-10-CM | POA: Diagnosis not present

## 2022-09-06 DIAGNOSIS — K219 Gastro-esophageal reflux disease without esophagitis: Secondary | ICD-10-CM | POA: Diagnosis not present

## 2022-09-06 DIAGNOSIS — E1142 Type 2 diabetes mellitus with diabetic polyneuropathy: Secondary | ICD-10-CM | POA: Diagnosis not present

## 2022-09-06 DIAGNOSIS — I10 Essential (primary) hypertension: Secondary | ICD-10-CM | POA: Diagnosis not present

## 2022-09-24 DIAGNOSIS — D509 Iron deficiency anemia, unspecified: Secondary | ICD-10-CM | POA: Diagnosis not present

## 2022-10-01 ENCOUNTER — Other Ambulatory Visit: Payer: Self-pay

## 2022-10-01 DIAGNOSIS — E782 Mixed hyperlipidemia: Secondary | ICD-10-CM

## 2022-10-01 DIAGNOSIS — I25118 Atherosclerotic heart disease of native coronary artery with other forms of angina pectoris: Secondary | ICD-10-CM

## 2022-10-01 DIAGNOSIS — Z79899 Other long term (current) drug therapy: Secondary | ICD-10-CM

## 2022-10-01 MED ORDER — EZETIMIBE 10 MG PO TABS: 10.0000 mg | ORAL_TABLET | Freq: Every day | ORAL | 3 refills | Status: DC

## 2022-10-08 DIAGNOSIS — E782 Mixed hyperlipidemia: Secondary | ICD-10-CM | POA: Diagnosis not present

## 2022-10-08 DIAGNOSIS — K219 Gastro-esophageal reflux disease without esophagitis: Secondary | ICD-10-CM | POA: Diagnosis not present

## 2022-10-08 DIAGNOSIS — I10 Essential (primary) hypertension: Secondary | ICD-10-CM | POA: Diagnosis not present

## 2022-10-08 DIAGNOSIS — E1142 Type 2 diabetes mellitus with diabetic polyneuropathy: Secondary | ICD-10-CM | POA: Diagnosis not present

## 2022-10-12 DIAGNOSIS — D509 Iron deficiency anemia, unspecified: Secondary | ICD-10-CM | POA: Diagnosis not present

## 2022-10-19 ENCOUNTER — Other Ambulatory Visit: Payer: Self-pay | Admitting: Gastroenterology

## 2022-10-19 ENCOUNTER — Ambulatory Visit
Admission: RE | Admit: 2022-10-19 | Discharge: 2022-10-19 | Disposition: A | Discharge status: Home / Self Care | Payer: PPO | Source: Ambulatory Visit | Attending: Gastroenterology | Admitting: Gastroenterology

## 2022-10-19 DIAGNOSIS — S30851A Superficial foreign body of abdominal wall, initial encounter: Secondary | ICD-10-CM

## 2022-10-19 DIAGNOSIS — Z09 Encounter for follow-up examination after completed treatment for conditions other than malignant neoplasm: Secondary | ICD-10-CM | POA: Diagnosis not present

## 2022-11-06 DIAGNOSIS — I10 Essential (primary) hypertension: Secondary | ICD-10-CM | POA: Diagnosis not present

## 2022-11-06 DIAGNOSIS — E1165 Type 2 diabetes mellitus with hyperglycemia: Secondary | ICD-10-CM | POA: Diagnosis not present

## 2022-11-06 DIAGNOSIS — R809 Proteinuria, unspecified: Secondary | ICD-10-CM | POA: Diagnosis not present

## 2022-11-09 ENCOUNTER — Other Ambulatory Visit: Payer: Self-pay | Admitting: *Deleted

## 2022-11-09 DIAGNOSIS — I739 Peripheral vascular disease, unspecified: Secondary | ICD-10-CM

## 2022-11-09 DIAGNOSIS — I6523 Occlusion and stenosis of bilateral carotid arteries: Secondary | ICD-10-CM

## 2022-11-19 NOTE — Progress Notes (Unsigned)
HISTORY AND PHYSICAL     CC:  follow up. Requesting Provider:  Clovis Riley, L.August Saucer, MD  HPI: This is a 72 y.o. male who is here today for follow up and pt has hx of with most recently left CFA endarterectomy with profundoplasty and endarterectomy of proximal SFA with bovine pericardial patch angioplasty and left CFA to BK popliteal bypass with PTFE 06/05/2021 Dr. Chestine Spore. Pt also has hx of right CEA for asymptomatic carotid artery stenosis 08/24/2019 also by Dr. Chestine Spore.    Vascular Hx: -left EIA stenting with angioplasty 06/18/2018 Dr. Chestine Spore -right CEA (asymptomatic) 08/24/2019 Dr. Chestine Spore -left SFA and AK popliteal atherectomy and angioplasty 12/02/2019 Dr. Chestine Spore -left CFA endarterectomy with profundoplasty and endarterectomy of proximal SFA with bovine pericardial patch angioplasty and left CFA to BK popliteal bypass with PTFE 06/05/2021 Dr. Chestine Spore - left CFA endarterectomy with profundoplasty and endarterectomy of proximal SFA with bovine pericardial patch angioplasty and left CFA to BK popliteal bypass with PTFE 06/05/2021 Dr. Chestine Spore   In May 2023, pt underwent CABG x 3 by Dr. Cliffton Asters.  His preoperative ABI and carotid duplex were done.  His left ABI was significantly improved and carotid duplex remained 1-39% bilateral ICA stenosis  Pt was last seen on 05/01/2022.  At that time, he was not having any claudication, rest pain or non healing wounds.  He did have some back issues in setting of hx of back surgery.  He did have some swelling in the left leg that would come and go, but he would wear compression and elevate his legs and it would improve.    The pt returns today for ***  Pt *** any amaurosis fugax, speech difficulties, weakness, numbness, paralysis or clumsiness or facial droop.    Pt *** claudication, ***rest pain, or ***non healing wounds.    The pt is on a statin for cholesterol management.    The pt is on an aspirin.    Other AC:  Plavix The pt is on ACEI, CCB for hypertension.  The pt is   on medication for diabetes. Tobacco hx:  former  Pt does not have family hx of AAA.    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   Dysphagia    Esophageal dysmotility    GERD (gastroesophageal reflux disease)    Heart murmur    pt had recent echocardiogram   History of hiatal hernia    History of Holter monitoring 2009   History of kidney stones    HNP (herniated nucleus pulposus), lumbar    L4-5   HYPERCHOLESTEROLEMIA 11/30/2009   Hyperlipidemia    HYPERTENSION 11/30/2009   Kidney stones    Numbness and tingling    Peripheral vascular disease (HCC)    Right carotid bruit    SMOKER 11/30/2009   Stroke (HCC) 2012   URINARY CALCULUS 11/30/2009    Past Surgical History:  Procedure Laterality Date   ABDOMINAL AORTOGRAM W/LOWER EXTREMITY N/A 06/18/2018   Procedure: ABDOMINAL AORTOGRAM W/LOWER EXTREMITY;  Surgeon: Cephus Shelling, MD;  Location: [facility];  Service: Cardiovascular;  Laterality: N/A;   ABDOMINAL AORTOGRAM W/LOWER EXTREMITY N/A 12/02/2019   Procedure: ABDOMINAL AORTOGRAM W/LOWER EXTREMITY;  Surgeon: Cephus Shelling, MD;  Location: [facility];  Service: Cardiovascular;  Laterality: N/A;   ABDOMINAL AORTOGRAM W/LOWER EXTREMITY N/A 03/23/2021   Procedure: ABDOMINAL AORTOGRAM W/LOWER EXTREMITY;  Surgeon: Cephus Shelling, MD;  Location: [facility];  Service: Cardiovascular;  Laterality: N/A;   AORTIC VALVE REPLACEMENT N/A 10/25/2021   Procedure: AORTIC VALVE REPLACEMENT (AVR)USING INSPIRIS RESILIA  AORTIC VALVE;  Surgeon: Corliss Skains, MD;  Location: [facility] OR;  Service: Open Heart Surgery;  Laterality: N/A;   arthroscopy of right shoulder Right    Jan 2019, June 2019-- done at [facility], Dr. Magnus Ivan   BACK SURGERY      CATARACT EXTRACTION W/ INTRAOCULAR LENS  IMPLANT, BILATERAL     CORONARY ARTERY BYPASS GRAFT N/A 10/25/2021   Procedure: CORONARY ARTERY BYPASS GRAFTING (CABG) X 3 USING LEFT INTERNAL MAMMARY ARTERY AND RIGHT GREATER SAPHENOUS VEIN;  Surgeon: Corliss Skains, MD;  Location: [facility];  Service: Open Heart Surgery;  Laterality: N/A;   CYSTOSCOPY     ENDARTERECTOMY Right 08/24/2019   Procedure: ENDARTERECTOMY CAROTID;  Surgeon: Cephus Shelling, MD;  [facility];  Service: Vascular;  Laterality: Right;   ENDARTERECTOMY FEMORAL Left 06/05/2021   Procedure: ENDARTERECTOMY FEMORAL WITH PROFUNDAPLASTY;  Surgeon: Cephus Shelling, MD;  Location: [facility] OR;  Service: Vascular;  Laterality: Left;   ENDOVEIN HARVEST OF GREATER SAPHENOUS VEIN Right 10/25/2021   Procedure: ENDOVEIN HARVEST OF GREATER SAPHENOUS VEIN;  Surgeon: Corliss Skains, MD;  Location: [facility];  Service: Open Heart Surgery;  Laterality: Right;   FACIAL COSMETIC SURGERY  2014   FEMORAL-POPLITEAL BYPASS GRAFT Left 06/05/2021   Procedure: LEFT FEMORAL-POPLITEAL ARTERY BYPASS GRAFTING USING 6mm PROPATEN VASCULAR REMOVABLE RING GRAFT;  Surgeon: Cephus Shelling, MD;  Location: [facility];  Service: Vascular;  Laterality: Left;   FINGER SURGERY  2002   LEFT HEART CATH AND CORONARY ANGIOGRAPHY N/A 10/05/2021   Procedure: LEFT HEART CATH AND CORONARY ANGIOGRAPHY;  Surgeon: Yvonne Kendall, MD;  [facility]: [facility];  Service: Cardiovascular;  Laterality: N/A;   LUMBAR LAMINECTOMY/DECOMPRESSION MICRODISCECTOMY Left 08/01/2018   Procedure: Microdiscectomy - Lumbar four-Lumbar five - left;  Surgeon: Tia Alert, MD;  Location: [facility] OR;  Service: Neurosurgery;  Laterality: Left;   LUMBAR LAMINECTOMY/DECOMPRESSION MICRODISCECTOMY Left 10/15/2018   Procedure: Re-do Microdiscectomy - left - Lumbar four-Lumbar five;  Surgeon: Tia Alert, MD;  Location: [facility] OR;  Service: Neurosurgery;  Laterality: Left;   NECK SURGERY  1986   PATCH  ANGIOPLASTY Left 06/05/2021   Procedure: PATCH ANGIOPLASTY USING Livia Snellen BIOLOGIC PATCH;  Surgeon: Cephus Shelling, MD;  [facility];  Service: Vascular;  Laterality: Left;   PERIPHERAL VASCULAR ATHERECTOMY  12/02/2019   Procedure: PERIPHERAL VASCULAR ATHERECTOMY;  Surgeon: Cephus Shelling, MD;  Location: [facility];  Service: Cardiovascular;;  left SFA   PERIPHERAL VASCULAR INTERVENTION  06/18/2018   Procedure: PERIPHERAL VASCULAR INTERVENTION;  Surgeon: Cephus Shelling, MD;  [facility]: [facility];  Service: Cardiovascular;;  Left external iliac   ROTATOR CUFF REPAIR Left    TEE WITHOUT CARDIOVERSION N/A 10/25/2021   Procedure: TRANSESOPHAGEAL ECHOCARDIOGRAM (TEE);  Surgeon: Corliss Skains, MD;  Location: [facility] OR;  Service: Open Heart Surgery;  Laterality: N/A;   UMBILICAL HERNIA REPAIR      Allergies  Allergen Reactions   Codeine Nausea Only    Current Outpatient Medications  Medication Sig Dispense Refill   acetaminophen (TYLENOL) 500 MG tablet Take 1,000 mg by mouth every 6 (six) hours as  needed for mild pain.     amLODipine (NORVASC) 5 MG tablet Take 1 tablet (5 mg total) by mouth daily. 90 tablet 2   aspirin EC 81 MG EC tablet Take 1 tablet (81 mg total) by mouth daily at 6 (six) AM.     benazepril (LOTENSIN) 20 MG tablet Take 1 tablet (20 mg total) by mouth daily. 90 tablet 3   clopidogrel (PLAVIX) 75 MG tablet TAKE 1 TABLET BY MOUTH EVERY DAY 90 tablet 3   Continuous Blood Gluc Sensor (FREESTYLE LIBRE 2 SENSOR) MISC AS DIRECTED FOR BLOOD SUGAR CHECK DX E11.42     ezetimibe (ZETIA) 10 MG tablet Take 1 tablet (10 mg total) by mouth daily. 90 tablet 3   insulin aspart (NOVOLOG FLEXPEN) 100 UNIT/ML FlexPen Inject 100 Units into the skin in the morning, at noon, and at bedtime.     insulin degludec (TRESIBA) 200 UNIT/ML FlexTouch Pen Inject 34 Units into the skin in the morning.     iron polysaccharides (NIFEREX) 150 MG capsule Take 1 capsule (150 mg  total) by mouth daily. 30 capsule 0   metFORMIN (GLUCOPHAGE-XR) 500 MG 24 hr tablet Take 2 tablets (1,000 mg total) by mouth 2 (two) times daily.     ONETOUCH VERIO test strip 1 each 3 (three) times daily.     pantoprazole (PROTONIX) 40 MG tablet Take 40 mg by mouth daily with breakfast.      pioglitazone (ACTOS) 45 MG tablet TAKE 1 TABLET DAILY (NEED APPOINTMENT FOR FURTHER REFILLS) 90 tablet 0   rosuvastatin (CRESTOR) 40 MG tablet Take 1 tablet (40 mg total) by mouth daily. 90 tablet 2   No current facility-administered medications for this visit.    Family History  Problem Relation Age of Onset   Diabetes Sister    Diabetes Mellitus I Sister    Stroke Father    Heart disease Father    CVA Father    Coronary artery disease Father    Diabetes Mother    Heart disease Mother    Diabetes Mellitus I Mother    Coronary artery disease Mother    Coronary artery disease Brother    Heart disease Brother    Coronary artery disease Brother     Social History   Socioeconomic History   Marital status: Married    Spouse name: [name]    Number of children: 3   Years of education: 11   Highest education level: Not on file  Occupational History   Occupation: Truck Air traffic controller: [employer]  Tobacco Use   Smoking status: Former    Years: 40    Types: Cigarettes    Quit date: 12/2015    Years since quitting: 6.9    Passive exposure: Current (Wife Is a smoker)   Smokeless tobacco: Never   Tobacco comments:    Quit 12-2012  Vaping Use   Vaping Use: Never used  Substance and Sexual Activity   Alcohol use: No   Drug use: No   Sexual activity: Not on file  Other Topics Concern   Not on file  Social History Narrative   . Patient drinks 2-3 cups of caffeine daily.    Patient lives at home with his wife [name].    Patient works at [employer].    Education. 12 th grade   Right handed               Social Determinants of Health   Financial Resource Strain: Not on  file  Food  Insecurity: No Food Insecurity (02/28/2022)   Hunger Vital Sign    Worried About Running Out of Food in the Last Year: Never true    Ran Out of Food in the Last Year: Never true  Transportation Needs: No Transportation Needs (02/28/2022)   PRAPARE - Administrator, Civil Service (Medical): No    Lack of Transportation (Non-Medical): No  Physical Activity: Not on file  Stress: Not on file  Social Connections: Not on file  Intimate Partner Violence: Not At Risk (02/28/2022)   Humiliation, Afraid, Rape, and Kick questionnaire    Fear of Current or Ex-Partner: No    Emotionally Abused: No    Physically Abused: No    Sexually Abused: No     REVIEW OF SYSTEMS:  *** [X]  denotes positive finding, [ ]  denotes negative finding Cardiac  Comments:  Chest pain or chest pressure:    Shortness of breath upon exertion:    Short of breath when lying flat:    Irregular heart rhythm:        Vascular    Pain in calf, thigh, or hip brought on by ambulation:    Pain in feet at night that wakes you up from your sleep:     Blood clot in your veins:    Leg swelling:         Pulmonary    Oxygen at home:    Wheezing:         Neurologic    Sudden weakness in arms or legs:     Sudden numbness in arms or legs:     Sudden onset of difficulty speaking or understanding others    Temporary loss of vision in one eye:     Problems with dizziness:         Gastrointestinal    Blood in stool:     Vomited blood:         Genitourinary    Burning when urinating:     Blood in urine:        Psychiatric    Major depression:         Hematologic    Bleeding problems:    Problems with blood clotting too easily:        Skin    Rashes or ulcers:        Constitutional    Fever or chills:      PHYSICAL EXAMINATION:  ***  General:  WDWN in NAD; vital signs documented above Gait: Not observed HENT: WNL, normocephalic Pulmonary: normal non-labored breathing  Cardiac: {Desc;  regular/irreg:14544} HR;  {With/Without:20273} carotid bruit*** Abdomen: soft, NT, aortic pulse is *** palpable Skin: {With/Without:20273} rashes Vascular Exam/Pulses:  Right Left  Radial {Exam; arterial pulse strength 0-4:30167} {Exam; arterial pulse strength 0-4:30167}  Femoral {Exam; arterial pulse strength 0-4:30167} {Exam; arterial pulse strength 0-4:30167}  Popliteal {Exam; arterial pulse strength 0-4:30167} {Exam; arterial pulse strength 0-4:30167}  DP {Exam; arterial pulse strength 0-4:30167} {Exam; arterial pulse strength 0-4:30167}  PT {Exam; arterial pulse strength 0-4:30167} {Exam; arterial pulse strength 0-4:30167}  Peroneal *** ***   Extremities: *** Musculoskeletal: no muscle wasting or atrophy  Neurologic: A&O X 3;  speech is fluent/normal; moving all extremities equally *** Psychiatric:  The pt has {Desc; normal/abnormal:11317::"Normal"} affect.   Non-Invasive Vascular Imaging:   ABI's/TBI's on 11/20/2022: Right:  *** - great toe pressure:  *** Left:  *** - great toe pressure:  ***   Arterial duplex on 11/20/2022: ***  Carotid Duplex on 11/20/2022: Right:  ***% ICA stenosis Left:  ***% ICA stenosis  Previous ABI's/TBI's on 05/01/2022: Right:  0.62/0.55 - great toe pressure:  87 Left:  0.94/0.69 - great toe pressure:  110  Previous arterial duplex 05/01/2022 Left Graft #1: Left Femoral-popliteal bypass graft  +--------------------+--------+---------------+--------+--------+                     PSV cm/sStenosis       WaveformComments  +--------------------+--------+---------------+--------+--------+  Inflow             186     30-49% stenosisbiphasic          +--------------------+--------+---------------+--------+--------+  Proximal Anastomosis156                    biphasic          +--------------------+--------+---------------+--------+--------+  Proximal Graft      77                     biphasic           +--------------------+--------+---------------+--------+--------+  Mid Graft           52                     biphasic          +--------------------+--------+---------------+--------+--------+  Distal Graft        48                     biphasic          +--------------------+--------+---------------+--------+--------+  Distal Anastomosis  73                     biphasic          +--------------------+--------+---------------+--------+--------+  Outflow            81                     biphasic          +--------------------+--------+---------------+--------+--------+   Summary:  Left: Patent Left femoral-popliteal bypass graft.   Previous Carotid duplex on 10/24/2021: Right: 1-39% ICA stenosis Left:   1-39% ICA stenosis    ASSESSMENT/PLAN:: 72 y.o. male here for follow up for PAD and carotid stenosis with hx of ***   PAD -*** -pt does *** have rest pain, ***claudication, ***non healing wounds. -continue graduated walking program -pt will f/u in *** with ***  Carotid stenosis -duplex today reveals *** -discussed s/s of stroke with pt and he understands should he develop any of these sx, he will go to the nearest ER or call 911. -pt will f/u in *** with ***  -continue statin/asa/Plavix    Doreatha Massed, [facility] [phone]  Clinic MD:   Chestine Spore

## 2022-11-20 ENCOUNTER — Ambulatory Visit (INDEPENDENT_AMBULATORY_CARE_PROVIDER_SITE_OTHER): Payer: PPO | Admitting: Physician Assistant

## 2022-11-20 ENCOUNTER — Encounter: Payer: Self-pay | Admitting: Physician Assistant

## 2022-11-20 ENCOUNTER — Ambulatory Visit (HOSPITAL_COMMUNITY)
Admission: RE | Admit: 2022-11-20 | Discharge: 2022-11-20 | Disposition: A | Discharge status: Home / Self Care | Payer: PPO | Source: Ambulatory Visit | Attending: Vascular Surgery | Admitting: Vascular Surgery

## 2022-11-20 ENCOUNTER — Ambulatory Visit (INDEPENDENT_AMBULATORY_CARE_PROVIDER_SITE_OTHER)
Admission: RE | Admit: 2022-11-20 | Discharge: 2022-11-20 | Disposition: A | Discharge status: Home / Self Care | Payer: PPO | Source: Ambulatory Visit | Attending: Vascular Surgery | Admitting: Vascular Surgery

## 2022-11-20 VITALS — BP 108/56 | HR 86 | Temp 98.0°F | Resp 18 | Ht 70.0 in | Wt 154.4 lb

## 2022-11-20 DIAGNOSIS — I779 Disorder of arteries and arterioles, unspecified: Secondary | ICD-10-CM

## 2022-11-20 DIAGNOSIS — I6523 Occlusion and stenosis of bilateral carotid arteries: Secondary | ICD-10-CM | POA: Diagnosis not present

## 2022-11-20 DIAGNOSIS — I739 Peripheral vascular disease, unspecified: Secondary | ICD-10-CM | POA: Diagnosis not present

## 2022-11-20 DIAGNOSIS — R1032 Left lower quadrant pain: Secondary | ICD-10-CM | POA: Diagnosis not present

## 2022-11-20 LAB — VAS US ABI WITH/WO TBI
Left ABI: 0.92
Right ABI: 0.52

## 2022-11-22 DIAGNOSIS — C4441 Basal cell carcinoma of skin of scalp and neck: Secondary | ICD-10-CM | POA: Diagnosis not present

## 2022-11-22 DIAGNOSIS — L570 Actinic keratosis: Secondary | ICD-10-CM | POA: Diagnosis not present

## 2022-11-22 DIAGNOSIS — D485 Neoplasm of uncertain behavior of skin: Secondary | ICD-10-CM | POA: Diagnosis not present

## 2022-11-22 DIAGNOSIS — R202 Paresthesia of skin: Secondary | ICD-10-CM | POA: Diagnosis not present

## 2022-11-23 ENCOUNTER — Other Ambulatory Visit: Payer: Self-pay

## 2022-11-23 DIAGNOSIS — H0102B Squamous blepharitis left eye, upper and lower eyelids: Secondary | ICD-10-CM | POA: Diagnosis not present

## 2022-11-23 DIAGNOSIS — I739 Peripheral vascular disease, unspecified: Secondary | ICD-10-CM

## 2022-11-23 DIAGNOSIS — R109 Unspecified abdominal pain: Secondary | ICD-10-CM

## 2022-11-23 DIAGNOSIS — E113211 Type 2 diabetes mellitus with mild nonproliferative diabetic retinopathy with macular edema, right eye: Secondary | ICD-10-CM | POA: Diagnosis not present

## 2022-11-23 DIAGNOSIS — D509 Iron deficiency anemia, unspecified: Secondary | ICD-10-CM | POA: Diagnosis not present

## 2022-11-23 DIAGNOSIS — R1032 Left lower quadrant pain: Secondary | ICD-10-CM

## 2022-11-23 DIAGNOSIS — K4090 Unilateral inguinal hernia, without obstruction or gangrene, not specified as recurrent: Secondary | ICD-10-CM | POA: Diagnosis not present

## 2022-11-23 DIAGNOSIS — Z0000 Encounter for general adult medical examination without abnormal findings: Secondary | ICD-10-CM | POA: Diagnosis not present

## 2022-11-23 DIAGNOSIS — H0102A Squamous blepharitis right eye, upper and lower eyelids: Secondary | ICD-10-CM | POA: Diagnosis not present

## 2022-11-23 DIAGNOSIS — Z961 Presence of intraocular lens: Secondary | ICD-10-CM | POA: Diagnosis not present

## 2022-11-26 ENCOUNTER — Other Ambulatory Visit: Payer: Self-pay

## 2022-11-26 MED ORDER — BENAZEPRIL HCL 20 MG PO TABS: 20.0000 mg | ORAL_TABLET | Freq: Every day | ORAL | 2 refills | Status: DC

## 2022-11-27 ENCOUNTER — Other Ambulatory Visit: Payer: Self-pay

## 2022-11-27 DIAGNOSIS — I6523 Occlusion and stenosis of bilateral carotid arteries: Secondary | ICD-10-CM

## 2022-11-27 DIAGNOSIS — I739 Peripheral vascular disease, unspecified: Secondary | ICD-10-CM

## 2022-11-28 ENCOUNTER — Ambulatory Visit (HOSPITAL_COMMUNITY): Payer: PPO | Attending: Internal Medicine

## 2022-11-28 DIAGNOSIS — Z952 Presence of prosthetic heart valve: Secondary | ICD-10-CM | POA: Diagnosis not present

## 2022-11-28 LAB — ECHOCARDIOGRAM COMPLETE
AR max vel: 1.02 cm2
AV Area VTI: 1.16 cm2
AV Area mean vel: 1.16 cm2
AV Mean grad: 10 mmHg
AV Peak grad: 17.8 mmHg
Ao pk vel: 2.11 m/s
Area-P 1/2: 5.93 cm2
S' Lateral: 2.6 cm

## 2022-12-02 NOTE — Progress Notes (Unsigned)
Cardiology Office Note:    Date:  12/04/2022   ID:  Luis Lindsey, DOB 10-30-1950, MRN [id number]  PCP:  Asencion Gowda.August Saucer, MD  [facility] Cardiologist:  Meriam Sprague, MD  [facility] Electrophysiologist:  None   Referring MD: Clovis Riley, Elbert Ewings.August Saucer, MD    History of Present Illness:    Luis Lindsey is a 72 y.o. male with a hx of PAD s/p L SFA atherectomy with angioplasty, carotid artery disease s/p L CEA, HTN, HLD, moderate AS, and tobacco use who returns to clinic for follow-up.  Patient was initially seen in 03/2020 after having episodes of lightheadedness in the setting of anemia, moderate AS, and relative hypotension. We stopped his HCTZ and decreased his metop to 25mg  XL daily. Symptoms resolved. TTE 08/2019 with normal EF. Moderate aortic valve stenosis. Aortic valve area, by VTI measures 1.21 cm. Aortic valve mean gradient measures 14.5 mmHg. Aortic valve Vmax measures 2.76 m/s.   Had abdominal aortogram in 02/2021 with Dr. Chestine Spore where he was found to have ostial high grade SFA stenosis. Was recommended for bypass at that time. He subsequently underwent  left common femoral endarterectomy with profundoplasty and endarterectomy of the proximal SFA with bovine pericardial patch angioplasty and left common femoral to below-knee popliteal artery bypass on 06/05/21 without issues. Was discharged on ASA and plavix.   Seen in clinic on 06/29/20 where he was doing well. Had stable exertional dyspnea but no chest pain. Repeat TTE 06/2020 with EF 60-65%, G1DD AVA 1.33, mean gradient , Vmax 2.84.   Seen by me on 08/28/21 where he had episode of substernal chest pressure radiating down his left arm. Coronary CTA was ordered which revealed coronary artery calcium score 4204, 98th percentile for age and gender.  Aortic valve calcium score 1928, very heavy proximal to mid coronary calcification so difficult to quantify degree of stenosis.  Suspicion of severe ostial RCA stenosis as well.  Proximal LAD, ramus, and LCx appear moderately stenosed.  Possible severe mid LCx stenosis with FFR that revealed multiple hemodynamically significant stenoses. He was advised to return to our office for discussion of cardiac catheterization.   Was seen on 10/03/2021 by Chelsea Aus, PA with worsening chest pain concerning for angina. Cardiac catheterization was scheduled for 10/05/2021 and revealed severe multivessel coronary disease including diffuse LMCA disease of up to 70% with pressure dampening of 57F diagnostic catheter, 90% distal LCx stenosis, and sequential 70-80% ostial, 40% proximal, 80% mid, and 50% distal RCA lesions. Widespread heavy calcification of the coronary arteries is evident, upper normal left ventricular filling pressure (LVEDP 15 mmHg), mild to moderate aortic valve stenosis (peak-to-peak gradient 21 mmHg). He was seen by CT surgery for CABG consultation. On 10/25/21 he underwent CABG x 3 utilizing LIMA to LAD, SVG to PDA, and SVG to OM1 and aortic valve replacement with a 23 mm Edwards Resilia Valve, and endoscopic harvest of greater saphenous vein from right leg. Beta blockers were held due to issues with junctional rhythm alternating with complete heart block but returned to normal sinus rhythm by POD 3. He was discharged on 10/31/21.  Was seen on 11/27/21 where he was having dizziness. He was in a junctional rhythm at that time. Cardiac monitor 12/01/21 showed 11 runs of nonsustained SVT with rare ectopy. TTE 12/05/21 with LVEF 55-60%, normal RV, mild LAE, trivial MR, well functionin 23mm Inpsiris Resilla valve with mean gradient , mild dilation of the ascending aorta 38mm.   Was seen by Dr. Elberta Fortis on 12/04/21.  Given that his cardiac monitor showed no recurrence of junctional bradycardia. PPM was deemed not needed at that time.  Was hospitalized 02/27/22-02/28/22 for significant anemia with hemoglobin 5.9. FOBT negative. UA without significant blood. Received 2units PRBCs and was  started on a PPI. He was discharged home with plans for GI follow-up. Has been taking iron pills. Repeat hemoglobin 10.2. Remained on DAPT.  Was last seen in clinic on 06/2022 where he was doing well from a CV standpoint. Continued to struggle with neuropathy.  Today, the patient overall feels okay. Has vertigo like symptoms with looking up and bending down but no associated lightheadedness. No chest pain, SOB, LE edema, orthopnea or PND. No significant calf pain with walking but admits he is not walking as much due to a hernia which causes discomfort. He is scheduled to see a surgeon in a couple of weeks to discuss further.   Tolerating medications as prescribed. Blood pressures are 140s mainly at home.  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   Dysphagia    Esophageal dysmotility    GERD (gastroesophageal reflux disease)    Heart murmur    pt had recent echocardiogram   History of hiatal hernia    History of Holter monitoring 2009   History of kidney stones    HNP (herniated nucleus pulposus), lumbar    L4-5   HYPERCHOLESTEROLEMIA 11/30/2009   Hyperlipidemia    HYPERTENSION 11/30/2009   Kidney stones    Numbness and tingling    Peripheral vascular disease (HCC)    Right carotid bruit    SMOKER 11/30/2009   Stroke (HCC) 2012   URINARY CALCULUS 11/30/2009    Past Surgical History:  Procedure Laterality Date   ABDOMINAL AORTOGRAM W/LOWER EXTREMITY N/A 06/18/2018   Procedure: ABDOMINAL AORTOGRAM W/LOWER EXTREMITY;  Surgeon: Cephus Shelling, MD;  [facility]: [facility];  Service: Cardiovascular;  Laterality: N/A;   ABDOMINAL AORTOGRAM W/LOWER EXTREMITY N/A 12/02/2019   Procedure: ABDOMINAL AORTOGRAM W/LOWER EXTREMITY;  Surgeon: Cephus Shelling, MD;  Location: [facility];   Service: Cardiovascular;  Laterality: N/A;   ABDOMINAL AORTOGRAM W/LOWER EXTREMITY N/A 03/23/2021   Procedure: ABDOMINAL AORTOGRAM W/LOWER EXTREMITY;  Surgeon: Cephus Shelling, MD;  Location: [facility];  Service: Cardiovascular;  Laterality: N/A;   AORTIC VALVE REPLACEMENT N/A 10/25/2021   Procedure: AORTIC VALVE REPLACEMENT (AVR)USING INSPIRIS RESILIA  AORTIC VALVE;  Surgeon: Corliss Skains, MD;  Location: [facility] OR;  Service: Open Heart Surgery;  Laterality: N/A;   arthroscopy of right shoulder Right    Jan 2019, June 2019-- done at [facility], Dr. Magnus Ivan   BACK SURGERY     CATARACT EXTRACTION W/ INTRAOCULAR LENS  IMPLANT, BILATERAL     CORONARY ARTERY BYPASS GRAFT N/A 10/25/2021   Procedure: CORONARY ARTERY BYPASS GRAFTING (CABG) X 3 USING LEFT INTERNAL MAMMARY ARTERY AND RIGHT GREATER SAPHENOUS VEIN;  Surgeon: Corliss Skains, MD;  Location: [facility];  Service: Open Heart Surgery;  Laterality: N/A;   CYSTOSCOPY     ENDARTERECTOMY Right 08/24/2019   Procedure: ENDARTERECTOMY CAROTID;  Surgeon: Cephus Shelling, MD;  Location: [facility] OR;  Service: Vascular;  Laterality: Right;   ENDARTERECTOMY FEMORAL Left 06/05/2021  Procedure: ENDARTERECTOMY FEMORAL WITH PROFUNDAPLASTY;  Surgeon: Cephus Shelling, MD;  Location: [facility] OR;  Service: Vascular;  Laterality: Left;   ENDOVEIN HARVEST OF GREATER SAPHENOUS VEIN Right 10/25/2021   Procedure: ENDOVEIN HARVEST OF GREATER SAPHENOUS VEIN;  Surgeon: Corliss Skains, MD;  Location: [facility];  Service: Open Heart Surgery;  Laterality: Right;   FACIAL COSMETIC SURGERY  2014   FEMORAL-POPLITEAL BYPASS GRAFT Left 06/05/2021   Procedure: LEFT FEMORAL-POPLITEAL ARTERY BYPASS GRAFTING USING 6mm PROPATEN VASCULAR REMOVABLE RING GRAFT;  Surgeon: Cephus Shelling, MD;  Location: [facility];  Service: Vascular;  Laterality: Left;   FINGER SURGERY  2002   LEFT HEART CATH AND CORONARY ANGIOGRAPHY N/A 10/05/2021   Procedure: LEFT  HEART CATH AND CORONARY ANGIOGRAPHY;  Surgeon: Yvonne Kendall, MD;  [facility]: [facility];  Service: Cardiovascular;  Laterality: N/A;   LUMBAR LAMINECTOMY/DECOMPRESSION MICRODISCECTOMY Left 08/01/2018   Procedure: Microdiscectomy - Lumbar four-Lumbar five - left;  Surgeon: Tia Alert, MD;  Location: [facility] OR;  Service: Neurosurgery;  Laterality: Left;   LUMBAR LAMINECTOMY/DECOMPRESSION MICRODISCECTOMY Left 10/15/2018   Procedure: Re-do Microdiscectomy - left - Lumbar four-Lumbar five;  Surgeon: Tia Alert, MD;  Location: [facility] OR;  Service: Neurosurgery;  Laterality: Left;   NECK SURGERY  1986   PATCH ANGIOPLASTY Left 06/05/2021   Procedure: PATCH ANGIOPLASTY USING Livia Snellen BIOLOGIC PATCH;  Surgeon: Cephus Shelling, MD;  Location: [facility] OR;  Service: Vascular;  Laterality: Left;   PERIPHERAL VASCULAR ATHERECTOMY  12/02/2019   Procedure: PERIPHERAL VASCULAR ATHERECTOMY;  Surgeon: Cephus Shelling, MD;  [facility];  Service: Cardiovascular;;  left SFA   PERIPHERAL VASCULAR INTERVENTION  06/18/2018   Procedure: PERIPHERAL VASCULAR INTERVENTION;  Surgeon: Cephus Shelling, MD;  [facility];  Service: Cardiovascular;;  Left external iliac   ROTATOR CUFF REPAIR Left    TEE WITHOUT CARDIOVERSION N/A 10/25/2021   Procedure: TRANSESOPHAGEAL ECHOCARDIOGRAM (TEE);  Surgeon: Corliss Skains, MD;  Location: [facility] OR;  Service: Open Heart Surgery;  Laterality: N/A;   UMBILICAL HERNIA REPAIR      Current Medications: Current Meds  Medication Sig   acetaminophen (TYLENOL) 500 MG tablet Take 1,000 mg by mouth every 6 (six) hours as needed for mild pain.   amLODipine (NORVASC) 5 MG tablet Take 1 tablet (5 mg total) by mouth daily.   aspirin EC 81 MG EC tablet Take 1 tablet (81 mg total) by mouth daily at 6 (six) AM.   clopidogrel (PLAVIX) 75 MG tablet TAKE 1 TABLET BY MOUTH EVERY DAY   Continuous Blood Gluc Sensor (FREESTYLE LIBRE 2 SENSOR) MISC AS DIRECTED  FOR BLOOD SUGAR CHECK DX E11.42   ezetimibe (ZETIA) 10 MG tablet Take 1 tablet (10 mg total) by mouth daily.   gabapentin (NEURONTIN) 100 MG capsule Take 100 mg by mouth 3 (three) times daily.   insulin degludec (TRESIBA) 200 UNIT/ML FlexTouch Pen Inject 34 Units into the skin in the morning. (Patient taking differently: Inject 20 Units into the skin in the morning.)   iron polysaccharides (NIFEREX) 150 MG capsule Take 1 capsule (150 mg total) by mouth daily.   metFORMIN (GLUCOPHAGE-XR) 500 MG 24 hr tablet Take 2 tablets (1,000 mg total) by mouth 2 (two) times daily.   ONETOUCH VERIO test strip 1 each 3 (three) times daily.   pantoprazole (PROTONIX) 40 MG tablet Take 40 mg by mouth daily with breakfast.    pioglitazone (ACTOS) 45 MG tablet TAKE 1 TABLET DAILY (NEED  APPOINTMENT FOR FURTHER REFILLS)   rosuvastatin (CRESTOR) 40 MG tablet Take 1 tablet (40 mg total) by mouth daily.   Semaglutide,0.25 or 0.5MG /DOS, (OZEMPIC, 0.25 OR 0.5 MG/DOSE,) 2 MG/1.5ML SOPN Inject 1 mg into the muscle once a week.   [DISCONTINUED] benazepril (LOTENSIN) 20 MG tablet Take 1 tablet (20 mg total) by mouth daily.     Allergies:   Codeine   Social History   Socioeconomic History   Marital status: Married    Spouse name: [name]    Number of children: 3   Years of education: 11   Highest education level: Not on file  Occupational History   Occupation: Truck Air traffic controller: [employer]  Tobacco Use   Smoking status: Former    Years: 40    Types: Cigarettes    Quit date: 12/2015    Years since quitting: 6.9    Passive exposure: Current (Wife Is a smoker)   Smokeless tobacco: Never   Tobacco comments:    Quit 12-2012  Vaping Use   Vaping Use: Never used  Substance and Sexual Activity   Alcohol use: No   Drug use: No   Sexual activity: Not on file  Other Topics Concern   Not on file  Social History Narrative   . Patient drinks 2-3 cups of caffeine daily.    Patient lives at home with his wife [name].     Patient works at [employer].    Education. 12 th grade   Right handed               Social Determinants of Health   Financial Resource Strain: Not on file  Food Insecurity: No Food Insecurity (02/28/2022)   Hunger Vital Sign    Worried About Running Out of Food in the Last Year: Never true    Ran Out of Food in the Last Year: Never true  Transportation Needs: No Transportation Needs (02/28/2022)   PRAPARE - Administrator, Civil Service (Medical): No    Lack of Transportation (Non-Medical): No  Physical Activity: Not on file  Stress: Not on file  Social Connections: Not on file     Family History: The patient's family history includes CVA in his father; Coronary artery disease in his brother, brother, father, and mother; Diabetes in his mother and sister; Diabetes Mellitus I in his mother and sister; Heart disease in his brother, father, and mother; Stroke in his father.  ROS:   Please see the history of present illness.      EKGs/Labs/Other Studies Reviewed:    The following studies were reviewed today: TTE 2021-12-25: IMPRESSIONS     1. Compared with echo 08/2021, there is now a well-functioning  bioprosthetic valve in the aortic position.   2. Left ventricular ejection fraction, by estimation, is 55 to 60%. The  left ventricle has normal function. The left ventricle has no regional  wall motion abnormalities. There is mild concentric left ventricular  hypertrophy. Left ventricular diastolic  parameters were normal. The average left ventricular global longitudinal  strain is -23.0 %. The global longitudinal strain is normal.   3. Right ventricular systolic function is normal. The right ventricular  size is normal. There is normal pulmonary artery systolic pressure.   4. Left atrial size was mildly dilated.   5. The mitral valve is normal in structure. Trivial mitral valve  regurgitation. No evidence of mitral stenosis.   6. The aortic valve has been  repaired/replaced. Aortic valve  regurgitation is not visualized. No aortic stenosis is present. There is a  23 mm Inpsiris Resilia valve present in the aortic position. Procedure  Date: 10/25/21. Echo findings are consistent  with normal structure and function of the aortic valve prosthesis. Aortic  valve mean gradient measures 10.0 mmHg. Aortic valve Vmax measures 2.12  m/s.   7. Aortic dilatation noted. There is mild dilatation of the ascending  aorta, measuring 38 mm.   8. The inferior vena cava is normal in size with greater than 50%  respiratory variability, suggesting right atrial pressure of 3 mmHg.   Comparison(s): TEE 10/25/21 EF 60-65%. Mild AI. Moderate AS.  Cardiac Monitor 12/01/21: Patch wear time was 6 days and 22 hours Predominant rhythm was NSR with average HR 71bpm There were 11 runs of nonsustained SVT with longest lasting 5 beats Rare SVE (<1%), rare VE (<1%) Patient triggered events correlate with NSR and NSR with PVCs No Afib, junctional rhythms or significant pauses noted     Patch Wear Time:  6 days and 22 hours (2023-06-22T09:20:00-0400 to 2023-06-29T08:06:25-0400)   Patient had a min HR of 55 bpm, max HR of 141 bpm, and avg HR of 71 bpm. Predominant underlying rhythm was Sinus Rhythm. 11 Supraventricular Tachycardia runs occurred, the run with the fastest interval lasting 4 beats with a max rate of 141 bpm, the  longest lasting 5 beats with an avg rate of 114 bpm. Isolated SVEs were rare (<1.0%), SVE Couplets were rare (<1.0%), and SVE Triplets were rare (<1.0%). Isolated VEs were rare (<1.0%), and no VE Couplets or VE Triplets were present.   LHC 10/05/21   Severe multivessel coronary disease, including diffuse LMCA disease of up to 70% with pressure dampening of 43F diagnostic catheter, 90% distal LCx stenosis, and sequential 70-80% ostial, 40% proximal, 80% mid, and 50% distal RCA lesions.  Widespread heavy calcification of the coronary arteries is  evident. Upper normal left ventricular filling pressure (LVEDP 15 mmHg). Mild-moderate aortic valve stenosis (peak-to-peak gradient 21 mmHg).   Recommendations: Outpatient cardiac surgery consultation for CABG. Aggressive secondary prevention of coronary artery disease. Continue dual antiplatelet therapy with aspirin and clopidogrel pending cardiac surgery evaluation and input from vascular surgery regarding interruption of clopidogrel in the setting of lower extremity interventions earlier this year   Coronary CT 09/27/21   1. Coronary artery calcium score 4204 Agatston units. This places the patient in the 98th percentile for age and gender.   2.  Aortic valve calcium score 1928 Agatston units.   3. Very heavy proximal-mid coronary calcification so difficult to quantify degree of stenosis. I suspect that there is severe ostial RCA stenosis. Proximal LAD, ramus, and LCx appear moderately stenosed. Possible severe mid LCx stenosis. Will send for FFR.   Echo 09/12/21    1. Mild to moderate AS (mean gradient 13 mmHg; AVA 1.5 cm2); gradient  slightly less on present study compared to 07/18/20.   2. Left ventricular ejection fraction, by estimation, is 60 to 65%. The  left ventricle has normal function. The left ventricle has no regional  wall motion abnormalities. Left ventricular diastolic parameters are  consistent with Grade I diastolic  dysfunction (impaired relaxation). The average left ventricular global  longitudinal strain is -20.9 %. The global longitudinal strain is normal.   3. Right ventricular systolic function is normal. The right ventricular  size is normal. Tricuspid regurgitation signal is inadequate for assessing  PA pressure.   4. The mitral valve is normal in structure. Trivial mitral  valve  regurgitation. No evidence of mitral stenosis.   5. The aortic valve is tricuspid. Aortic valve regurgitation is not  visualized. Mild to moderate aortic valve stenosis.   6.  The inferior vena cava is normal in size with greater than 50%  respiratory variability, suggesting right atrial pressure of 3 mmHg.    EKG:  NSR, first degree AVB, iRBB-personally reviewed    Recent Labs: 02/28/2022: ALT 39; BUN 10; Creatinine, Ser 0.84; Hemoglobin 9.2; Magnesium 2.1; Platelets 295; Potassium 2.8; Sodium 139   Recent Lipid Panel    Component Value Date/Time   CHOL 97 (L) 08/28/2022 0847   TRIG 74 08/28/2022 0847   HDL 53 08/28/2022 0847   CHOLHDL 1.8 08/28/2022 0847   CHOLHDL 2.4 06/06/2021 0329   VLDL 12 06/06/2021 0329   LDLCALC 28 08/28/2022 0847     Physical Exam:    VS:  BP 134/74   Pulse 81   Ht 5\' 10"  (1.778 m)   Wt 150 lb 12.8 oz (68.4 kg)   SpO2 98%   BMI 21.64 kg/m     Wt Readings from Last 3 Encounters:  12/04/22 150 lb 12.8 oz (68.4 kg)  11/20/22 154 lb 6.4 oz (70 kg)  07/02/22 167 lb 6.4 oz (75.9 kg)     GEN:  Comfortable, NAD HEENT: Normal NECK: No JVD; no carotid bruits CARDIAC: RRR, 2/6 systolic murmur RESPIRATORY:  Clear to auscultation without rales, wheezing or rhonchi  ABDOMEN: Soft, non-tender, non-distended MUSCULOSKELETAL:  No edema; No deformity  SKIN: Warm and dry NEUROLOGIC:  Alert and oriented x 3 PSYCHIATRIC:  Normal affect   ASSESSMENT:    1. Coronary artery disease of native artery of native heart with stable angina pectoris (HCC)   2. Essential hypertension   3. PAD (peripheral artery disease) (HCC)   4. S/P AVR (aortic valve replacement)   5. Mixed hyperlipidemia   6. Bilateral carotid artery stenosis   7. Junctional cardiac arrhythmia   8. Diabetes mellitus with coincident hypertension (HCC)     PLAN:    In order of problems listed above:  #Coronary Artery Disease s/p CABG x3: Cath 10/05/21 with severe multivessel coronary disease including diffuse LMCA disease of up to 70% with pressure dampening of 68F diagnostic catheter, 90% distal LCx stenosis, and sequential 70-80% ostial, 40% proximal, 80% mid,  and 50% distal RCA lesions. Widespread heavy calcification of the coronary arteries is evident, upper normal left ventricular filling pressure (LVEDP 15 mmHg), mild to moderate aortic valve stenosis (peak-to-peak gradient 21 mmHg).He ultimately underwent CABGx3 on 10/25/21  utilizing LIMA to LAD, SVG to PDA, and SVG to OM1 and aortic valve replacement with a 23 mm Edwards Resilia Valve. TTE 11/2021 with LVEF 55-60%, normal functioning aortic valve prosthesis with mean gradient .  Currently doing well without anginal symptoms. -Continue ASA 81mg  daily -Continue plavix 75mg  daily  -Continue crestor 40mg  daily, zetia 10mg  daily -Not on BB due to post-op CHB and episode of junctional arrhythmia in clinic  #Moderate AS s/p AVR at time of CABG: S/p aortic valve replacement with a 23 mm Edwards Resilia Valve at time of CABG as detailed above. TTE 11/2021 with well functioning prosthesis with mean gradient .  -Continue serial monitoring  #Iron Deficiency Anemia: Hospitalized in 02/2022 for HgB 5.9. FOBT negative and UA negative but overall concern was for slow GI bleed in the setting of DAPT. He was given [facility] and was started on PPI with significant improvement. Last HgB 11.5 in 10/2022. -  Follow-up GI  -Continue PPI -Continue iron -Will continue DAPT for now given stable HgB  #PAD: S/p left common femoral endarterectomy with bovine patch angioplasty and left common femoral to below the knee popliteal artery bypass with PTFE by Dr. Chestine Spore on 06/05/2021. Claudication symptoms improved.  -Continue ASA 81mg  daily and plavix 75mg  daily -Continue PPI -Continue rosuvastatin 40mg  daily   #Carotid artery stenosis s/p L CEA: -Continue rosuvastatin 40mg  daily -Continue ASA 81mg , plavix 75mg  daily  #Junctional Rhythm: Patient developed junctional rhythm alternating with complete heart block following AVR. This resolved but then recurred on 11/16/21. Cardiac monitor showed no recurrence of the  rhythm fortunately and he was in NSR at follow-up with Dr. Elberta Fortis. Will continue to monitor at this time. -Will continue to monitor -Holding nodal agents   #HTN: #Concern for orthostasis: Significantly improved with stopping HCTZ. Initial BP 140 but improved to 130s. Will continue to monitor for now. -Stopped HCTZ -Continue amlodipine 5mg  daily -Continue benzapril 20mg  daily -Monitor BP at home   #HLD: -Continue rosuvastatin 40mg  daily -Continue zetia 10mg  daily -LDL controlled at 28  #DMII: -A1C 7.7 -Referred to Endocrine -Currently on insulin, ozempic, actos, and metformin  Follow-up:  6months  Medication Adjustments/Labs and Tests Ordered: Current medicines are reviewed at length with the patient today.  Concerns regarding medicines are outlined above.  Orders Placed This Encounter  Procedures   EKG 12-Lead   Meds ordered this encounter  Medications   benazepril (LOTENSIN) 20 MG tablet    Sig: Take 1 tablet (20 mg total) by mouth daily.    Dispense:  90 tablet    Refill:  2    Patient Instructions  Medication Instructions:   Your physician recommends that you continue on your current medications as directed. Please refer to the Current Medication list given to you today.  *If you need a refill on your cardiac medications before your next appointment, please call your pharmacy*    Follow-Up: At [facility], you and your health needs are our priority.  As part of our continuing mission to provide you with exceptional heart care, we have created designated Provider Care Teams.  These Care Teams include your primary Cardiologist (physician) and Advanced Practice Providers (APPs -  Physician Assistants and Nurse Practitioners) who all work together to provide you with the care you need, when you need it.  We recommend signing up for the patient portal called "MyChart".  Sign up information is provided on this After Visit Summary.  MyChart is used to connect  with patients for Virtual Visits (Telemedicine).  Patients are able to view lab/test results, encounter notes, upcoming appointments, etc.  Non-urgent messages can be sent to your provider as well.   To learn more about what you can do with MyChart, go to [email or web address].    Your next appointment:   6 month(s)  Provider:   Dr. Clifton James      Signed, Meriam Sprague, MD  12/04/2022 11:43 AM    [facility]

## 2022-12-03 ENCOUNTER — Telehealth: Payer: Self-pay | Admitting: Neurology

## 2022-12-03 NOTE — Telephone Encounter (Signed)
Unable to reach pt over the phone, sent mychart msg informing pt of need to reschedule 12/11/22 appt - MD out

## 2022-12-04 ENCOUNTER — Encounter: Payer: Self-pay | Admitting: Cardiology

## 2022-12-04 ENCOUNTER — Ambulatory Visit: Payer: PPO | Attending: Cardiology | Admitting: Cardiology

## 2022-12-04 VITALS — BP 134/74 | HR 81 | Ht 70.0 in | Wt 150.8 lb

## 2022-12-04 DIAGNOSIS — Z7984 Long term (current) use of oral hypoglycemic drugs: Secondary | ICD-10-CM | POA: Diagnosis not present

## 2022-12-04 DIAGNOSIS — I10 Essential (primary) hypertension: Secondary | ICD-10-CM | POA: Diagnosis not present

## 2022-12-04 DIAGNOSIS — I25118 Atherosclerotic heart disease of native coronary artery with other forms of angina pectoris: Secondary | ICD-10-CM

## 2022-12-04 DIAGNOSIS — E782 Mixed hyperlipidemia: Secondary | ICD-10-CM

## 2022-12-04 DIAGNOSIS — Z952 Presence of prosthetic heart valve: Secondary | ICD-10-CM

## 2022-12-04 DIAGNOSIS — E119 Type 2 diabetes mellitus without complications: Secondary | ICD-10-CM | POA: Diagnosis not present

## 2022-12-04 DIAGNOSIS — I6523 Occlusion and stenosis of bilateral carotid arteries: Secondary | ICD-10-CM

## 2022-12-04 DIAGNOSIS — I739 Peripheral vascular disease, unspecified: Secondary | ICD-10-CM

## 2022-12-04 DIAGNOSIS — I498 Other specified cardiac arrhythmias: Secondary | ICD-10-CM

## 2022-12-04 MED ORDER — BENAZEPRIL HCL 20 MG PO TABS: 20.0000 mg | ORAL_TABLET | Freq: Every day | ORAL | 2 refills | Status: AC

## 2022-12-04 NOTE — Patient Instructions (Signed)
Medication Instructions:  Your physician recommends that you continue on your current medications as directed. Please refer to the Current Medication list given to you today.  *If you need a refill on your cardiac medications before your next appointment, please call your pharmacy*   Follow-Up: At [facility], you and your health needs are our priority.  As part of our continuing mission to provide you with exceptional heart care, we have created designated Provider Care Teams.  These Care Teams include your primary Cardiologist (physician) and Advanced Practice Providers (APPs -  Physician Assistants and Nurse Practitioners) who all work together to provide you with the care you need, when you need it.  We recommend signing up for the patient portal called "MyChart".  Sign up information is provided on this After Visit Summary.  MyChart is used to connect with patients for Virtual Visits (Telemedicine).  Patients are able to view lab/test results, encounter notes, upcoming appointments, etc.  Non-urgent messages can be sent to your provider as well.   To learn more about what you can do with MyChart, go to [email or web address].    Your next appointment:   6 month(s)  Provider:   Dr McAlhany   

## 2022-12-10 DIAGNOSIS — D509 Iron deficiency anemia, unspecified: Secondary | ICD-10-CM | POA: Diagnosis not present

## 2022-12-10 DIAGNOSIS — E782 Mixed hyperlipidemia: Secondary | ICD-10-CM | POA: Diagnosis not present

## 2022-12-10 DIAGNOSIS — E1142 Type 2 diabetes mellitus with diabetic polyneuropathy: Secondary | ICD-10-CM | POA: Diagnosis not present

## 2022-12-10 DIAGNOSIS — K219 Gastro-esophageal reflux disease without esophagitis: Secondary | ICD-10-CM | POA: Diagnosis not present

## 2022-12-10 DIAGNOSIS — I10 Essential (primary) hypertension: Secondary | ICD-10-CM | POA: Diagnosis not present

## 2022-12-11 ENCOUNTER — Institutional Professional Consult (permissible substitution): Payer: PPO | Admitting: Neurology

## 2022-12-14 ENCOUNTER — Ambulatory Visit (HOSPITAL_COMMUNITY)
Admission: RE | Admit: 2022-12-14 | Discharge: 2022-12-14 | Disposition: A | Discharge status: Home / Self Care | Payer: PPO | Source: Ambulatory Visit | Attending: Vascular Surgery | Admitting: Vascular Surgery

## 2022-12-14 DIAGNOSIS — N200 Calculus of kidney: Secondary | ICD-10-CM | POA: Diagnosis not present

## 2022-12-14 DIAGNOSIS — R109 Unspecified abdominal pain: Secondary | ICD-10-CM | POA: Insufficient documentation

## 2022-12-14 DIAGNOSIS — R1032 Left lower quadrant pain: Secondary | ICD-10-CM | POA: Insufficient documentation

## 2022-12-14 DIAGNOSIS — N12 Tubulo-interstitial nephritis, not specified as acute or chronic: Secondary | ICD-10-CM | POA: Diagnosis not present

## 2022-12-14 DIAGNOSIS — I739 Peripheral vascular disease, unspecified: Secondary | ICD-10-CM | POA: Insufficient documentation

## 2022-12-14 DIAGNOSIS — K5750 Diverticulosis of both small and large intestine without perforation or abscess without bleeding: Secondary | ICD-10-CM | POA: Diagnosis not present

## 2022-12-14 DIAGNOSIS — N400 Enlarged prostate without lower urinary tract symptoms: Secondary | ICD-10-CM | POA: Diagnosis not present

## 2022-12-14 LAB — POCT I-STAT CREATININE: Creatinine, Ser: 1.1 mg/dL (ref 0.61–1.24)

## 2022-12-14 MED ORDER — IOHEXOL 350 MG/ML SOLN
75.0000 mL | Freq: Once | INTRAVENOUS | Status: AC | PRN
  Administered 2022-12-14: 75 mL via INTRAVENOUS

## 2022-12-18 ENCOUNTER — Encounter: Payer: Self-pay | Admitting: Vascular Surgery

## 2022-12-18 ENCOUNTER — Ambulatory Visit: Payer: PPO | Admitting: Vascular Surgery

## 2022-12-18 VITALS — BP 146/73 | HR 84 | Temp 97.9°F | Resp 16 | Ht 70.0 in | Wt 151.0 lb

## 2022-12-18 DIAGNOSIS — I739 Peripheral vascular disease, unspecified: Secondary | ICD-10-CM

## 2022-12-18 NOTE — Progress Notes (Signed)
Patient name: Luis Lindsey MRN: [id number] DOB: [birth year] Sex: male  REASON FOR VISIT: F/U after CT  HPI: Luis Lindsey is a 72 y.o. male well-known to our practice that presents for follow-up after CT for evaluation of left groin pain.  States he started noticing a bulge in his left groin about May of this year.  Worse when he is on his feet for long periods of time.  He had a duplex on 11/20/2022 showing a patent left leg bypass with some moderate stenosis in the external iliac artery.  His ABI on the left was 0.92 biphasic.  Our PA ordered a CT and follow-up with me after recent evaluation.  He has extensive history including left external iliac stenting for claudication on 06/18/2018.  Later he had left SFA stenting for claudication on 12/02/2019.  Ultimately he then had left common femoral endarterectomy with profundoplasty and bovine patch as well as left common femoral to below-knee popliteal bypass with 6 mm PTFE on 06/05/2021.  He is also had a right carotid endarterectomy on 08/24/2019.   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   Dysphagia    Esophageal dysmotility    GERD (gastroesophageal reflux disease)    Heart murmur    pt had recent echocardiogram   History of hiatal hernia    History of Holter monitoring 2009   History of kidney stones    HNP (herniated nucleus pulposus), lumbar    L4-5   HYPERCHOLESTEROLEMIA 11/30/2009   Hyperlipidemia    HYPERTENSION 11/30/2009   Kidney stones    Numbness and tingling    Peripheral vascular disease (HCC)    Right carotid bruit    SMOKER 11/30/2009   Stroke (HCC) 2012   URINARY CALCULUS 11/30/2009    Past Surgical History:  Procedure Laterality Date   ABDOMINAL AORTOGRAM W/LOWER EXTREMITY N/A 06/18/2018   Procedure: ABDOMINAL AORTOGRAM W/LOWER  EXTREMITY;  Surgeon: Cephus Shelling, MD;  Location: [facility];  Service: Cardiovascular;  Laterality: N/A;   ABDOMINAL AORTOGRAM W/LOWER EXTREMITY N/A 12/02/2019   Procedure: ABDOMINAL AORTOGRAM W/LOWER EXTREMITY;  Surgeon: Cephus Shelling, MD;  Location: [facility];  Service: Cardiovascular;  Laterality: N/A;   ABDOMINAL AORTOGRAM W/LOWER EXTREMITY N/A 03/23/2021   Procedure: ABDOMINAL AORTOGRAM W/LOWER EXTREMITY;  Surgeon: Cephus Shelling, MD;  Location: [facility];  Service: Cardiovascular;  Laterality: N/A;   AORTIC VALVE REPLACEMENT N/A 10/25/2021   Procedure: AORTIC VALVE REPLACEMENT (AVR)USING INSPIRIS RESILIA  AORTIC VALVE;  Surgeon: Corliss Skains, MD;  Location: [facility];  Service: Open Heart Surgery;  Laterality: N/A;   arthroscopy of right shoulder Right    Jan 2019, June 2019-- done at [facility], Dr. Magnus Ivan   BACK SURGERY     CATARACT EXTRACTION W/ INTRAOCULAR LENS  IMPLANT, BILATERAL     CORONARY ARTERY BYPASS GRAFT N/A 10/25/2021   Procedure: CORONARY ARTERY BYPASS GRAFTING (CABG) X 3 USING LEFT INTERNAL MAMMARY ARTERY AND RIGHT GREATER SAPHENOUS VEIN;  Surgeon: Corliss Skains, MD;  Location: [facility];  Service: Open Heart Surgery;  Laterality: N/A;   CYSTOSCOPY     ENDARTERECTOMY Right 08/24/2019   Procedure: ENDARTERECTOMY CAROTID;  Surgeon: Cephus Shelling,  MD;  Location: [facility];  Service: Vascular;  Laterality: Right;   ENDARTERECTOMY FEMORAL Left 06/05/2021   Procedure: ENDARTERECTOMY FEMORAL WITH PROFUNDAPLASTY;  Surgeon: Cephus Shelling, MD;  Location: [facility] OR;  Service: Vascular;  Laterality: Left;   ENDOVEIN HARVEST OF GREATER SAPHENOUS VEIN Right 10/25/2021   Procedure: ENDOVEIN HARVEST OF GREATER SAPHENOUS VEIN;  Surgeon: Corliss Skains, MD;  Location: [facility];  Service: Open Heart Surgery;  Laterality: Right;   FACIAL COSMETIC SURGERY  2014   FEMORAL-POPLITEAL BYPASS GRAFT Left 06/05/2021    Procedure: LEFT FEMORAL-POPLITEAL ARTERY BYPASS GRAFTING USING 6mm PROPATEN VASCULAR REMOVABLE RING GRAFT;  Surgeon: Cephus Shelling, MD;  Location: [facility];  Service: Vascular;  Laterality: Left;   FINGER SURGERY  2002   LEFT HEART CATH AND CORONARY ANGIOGRAPHY N/A 10/05/2021   Procedure: LEFT HEART CATH AND CORONARY ANGIOGRAPHY;  Surgeon: Yvonne Kendall, MD;  [facility]: [facility];  Service: Cardiovascular;  Laterality: N/A;   LUMBAR LAMINECTOMY/DECOMPRESSION MICRODISCECTOMY Left 08/01/2018   Procedure: Microdiscectomy - Lumbar four-Lumbar five - left;  Surgeon: Tia Alert, MD;  Location: [facility] OR;  Service: Neurosurgery;  Laterality: Left;   LUMBAR LAMINECTOMY/DECOMPRESSION MICRODISCECTOMY Left 10/15/2018   Procedure: Re-do Microdiscectomy - left - Lumbar four-Lumbar five;  Surgeon: Tia Alert, MD;  [facility] OR;  Service: Neurosurgery;  Laterality: Left;   NECK SURGERY  1986   PATCH ANGIOPLASTY Left 06/05/2021   Procedure: PATCH ANGIOPLASTY USING Livia Snellen BIOLOGIC PATCH;  Surgeon: Cephus Shelling, MD;  Location: [facility] OR;  Service: Vascular;  Laterality: Left;   PERIPHERAL VASCULAR ATHERECTOMY  12/02/2019   Procedure: PERIPHERAL VASCULAR ATHERECTOMY;  Surgeon: Cephus Shelling, MD;  [facility];  Service: Cardiovascular;;  left SFA   PERIPHERAL VASCULAR INTERVENTION  06/18/2018   Procedure: PERIPHERAL VASCULAR INTERVENTION;  Surgeon: Cephus Shelling, MD;  [facility];  Service: Cardiovascular;;  Left external iliac   ROTATOR CUFF REPAIR Left    TEE WITHOUT CARDIOVERSION N/A 10/25/2021   Procedure: TRANSESOPHAGEAL ECHOCARDIOGRAM (TEE);  Surgeon: Corliss Skains, MD;  Location: [facility] OR;  Service: Open Heart Surgery;  Laterality: N/A;   UMBILICAL HERNIA REPAIR      Family History  Problem Relation Age of Onset   Diabetes Sister    Diabetes Mellitus I Sister    Stroke Father    Heart disease Father    CVA Father    Coronary artery  disease Father    Diabetes Mother    Heart disease Mother    Diabetes Mellitus I Mother    Coronary artery disease Mother    Coronary artery disease Brother    Heart disease Brother    Coronary artery disease Brother     SOCIAL HISTORY: Social History   Tobacco Use   Smoking status: Former    Current packs/day: 0.00    Types: Cigarettes    Start date: 12/1975    Quit date: 12/2015    Years since quitting: 6.9    Passive exposure: Current (Wife Is a smoker)   Smokeless tobacco: Never   Tobacco comments:    Quit 12-2012  Substance Use Topics   Alcohol use: No    Allergies  Allergen Reactions   Codeine Nausea Only    Current Outpatient Medications  Medication Sig Dispense Refill   acetaminophen (TYLENOL) 500 MG tablet Take 1,000 mg by mouth every 6 (six) hours as needed for mild pain.     amLODipine (NORVASC) 5 MG  tablet Take 1 tablet (5 mg total) by mouth daily. 90 tablet 2   aspirin EC 81 MG EC tablet Take 1 tablet (81 mg total) by mouth daily at 6 (six) AM.     benazepril (LOTENSIN) 20 MG tablet Take 1 tablet (20 mg total) by mouth daily. 90 tablet 2   clopidogrel (PLAVIX) 75 MG tablet TAKE 1 TABLET BY MOUTH EVERY DAY 90 tablet 3   Continuous Blood Gluc Sensor (FREESTYLE LIBRE 2 SENSOR) MISC AS DIRECTED FOR BLOOD SUGAR CHECK DX E11.42     ezetimibe (ZETIA) 10 MG tablet Take 1 tablet (10 mg total) by mouth daily. 90 tablet 3   gabapentin (NEURONTIN) 100 MG capsule Take 100 mg by mouth 3 (three) times daily.     insulin degludec (TRESIBA) 200 UNIT/ML FlexTouch Pen Inject 34 Units into the skin in the morning. (Patient taking differently: Inject 20 Units into the skin in the morning.)     iron polysaccharides (NIFEREX) 150 MG capsule Take 1 capsule (150 mg total) by mouth daily. 30 capsule 0   metFORMIN (GLUCOPHAGE-XR) 500 MG 24 hr tablet Take 2 tablets (1,000 mg total) by mouth 2 (two) times daily.     ONETOUCH VERIO test strip 1 each 3 (three) times daily.      pantoprazole (PROTONIX) 40 MG tablet Take 40 mg by mouth daily with breakfast.      pioglitazone (ACTOS) 45 MG tablet TAKE 1 TABLET DAILY (NEED APPOINTMENT FOR FURTHER REFILLS) 90 tablet 0   rosuvastatin (CRESTOR) 40 MG tablet Take 1 tablet (40 mg total) by mouth daily. 90 tablet 2   Semaglutide,0.25 or 0.5MG /DOS, (OZEMPIC, 0.25 OR 0.5 MG/DOSE,) 2 MG/1.5ML SOPN Inject 1 mg into the muscle once a week.     No current facility-administered medications for this visit.    REVIEW OF SYSTEMS:  [X]  denotes positive finding, [ ]  denotes negative finding Cardiac  Comments:  Chest pain or chest pressure:    Shortness of breath upon exertion:    Short of breath when lying flat:    Irregular heart rhythm:        Vascular    Pain in calf, thigh, or hip brought on by ambulation:    Pain in feet at night that wakes you up from your sleep:     Blood clot in your veins:    Leg swelling:         Pulmonary    Oxygen at home:    Productive cough:     Wheezing:         Neurologic    Sudden weakness in arms or legs:     Sudden numbness in arms or legs:     Sudden onset of difficulty speaking or slurred speech:    Temporary loss of vision in one eye:     Problems with dizziness:         Gastrointestinal    Blood in stool:     Vomited blood:         Genitourinary    Burning when urinating:     Blood in urine:        Psychiatric    Major depression:         Hematologic    Bleeding problems:    Problems with blood clotting too easily:        Skin    Rashes or ulcers:        Constitutional    Fever or chills:  PHYSICAL EXAM: Vitals:   12/18/22 1339  BP: (!) 145/74  Pulse: 82  Resp: 16  Temp: 97.9 F (36.6 C)  TempSrc: Temporal  SpO2: 98%  Weight: 151 lb (68.5 kg)  Height: 5\' 10"  (1.778 m)    GENERAL: The patient is a well-nourished male, in no acute distress. The vital signs are documented above. CARDIAC: There is a regular rate and rhythm.  VASCULAR:  Left groin  incision well healed Left femoral pulse palpable Reducible mass - soft - suspect hernia  PULMONARY: No respiratory distress. ABDOMEN: Soft and non-tender. MUSCULOSKELETAL: There are no major deformities or cyanosis. NEUROLOGIC: No focal weakness or paresthesias are detected. PSYCHIATRIC: The patient has a normal affect.  DATA:   CT reviewed from 12/14/2022 with no pseudoaneurysm or other anastomotic complication from his femoropopliteal bypass in the left groin.    Assessment/Plan:  72 y.o. male well-known to our practice that presents for follow-up after CT for evaluation of left groin pain.  States he started noticing a bulge in his left groin about May of this year. He was sent for CT by our PA. Last had left common femoral endarterectomy with profundoplasty and bovine patch as well as left common femoral to below-knee popliteal bypass with 6 mm PTFE last year on 06/05/2021.  He has been noticing a bulge in his left groin since about April of this year.  This is reducible on exam and I suspect this is hernia.  He has evaluation with a general surgeon at [facility] tomorrow.  I reviewed his CT scan and did not see any anastomotic pseudoaneurysm or other complication in the left groin related to his recent femoropopliteal bypass.  He has follow-up with [place] in 6 months in January 2025 for noninvasive surveillance.   Cephus Shelling, MD [facility] Office: [phone]

## 2022-12-19 DIAGNOSIS — K4090 Unilateral inguinal hernia, without obstruction or gangrene, not specified as recurrent: Secondary | ICD-10-CM | POA: Diagnosis not present

## 2022-12-21 DIAGNOSIS — D509 Iron deficiency anemia, unspecified: Secondary | ICD-10-CM | POA: Diagnosis not present

## 2022-12-26 ENCOUNTER — Ambulatory Visit: Payer: PPO | Admitting: Neurology

## 2022-12-26 ENCOUNTER — Telehealth: Payer: Self-pay | Admitting: *Deleted

## 2022-12-26 ENCOUNTER — Encounter: Payer: Self-pay | Admitting: Neurology

## 2022-12-26 VITALS — BP 136/72 | HR 83 | Ht 70.0 in | Wt 144.0 lb

## 2022-12-26 DIAGNOSIS — G250 Essential tremor: Secondary | ICD-10-CM

## 2022-12-26 DIAGNOSIS — R202 Paresthesia of skin: Secondary | ICD-10-CM | POA: Diagnosis not present

## 2022-12-26 NOTE — Telephone Encounter (Signed)
Good afternoon Dr. Shari Prows,  We have received a surgical clearance request for inguinal hernia surgery for [name]. They were seen recently in clinic on 12/04/2022 he has a PMH of PAD s/p L SFA arthrectomy with angioplasty, left CEA, HTN, HLD, moderate AS. Can you please comment on surgical clearance and guidance on holding Plavix and aspirin. Please forward you guidance and recommendations to P CV DIV PREOP   Thanks,  Alden Server

## 2022-12-26 NOTE — Telephone Encounter (Signed)
   Patient Name: Luis Lindsey  DOB: January 04, 1951 MRN: [id number]  Primary Cardiologist: Meriam Sprague, MD  Chart reviewed as part of pre-operative protocol coverage. Given past medical history and time since last visit, based on ACC/AHA guidelines, Luis Lindsey is at acceptable risk for the planned procedure without further cardiovascular testing.   Patient can hold Plavix 5 days prior to surgery and aspirin 7 days prior to surgery and can be restarted postprocedure when surgically safe and hemostasis is achieved.  The patient was advised that if he develops new symptoms prior to surgery to contact our office to arrange for a follow-up visit, and he verbalized understanding.  I will route this recommendation to the requesting party via Epic fax function and remove from pre-op pool.  Please call with questions.  Napoleon Form, Leodis Rains, NP 12/26/2022, 12:13 PM
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
## 2022-12-26 NOTE — Telephone Encounter (Signed)
   Pre-operative Risk Assessment    Patient Name: Luis Lindsey  DOB: 11-29-1950 MRN: [id number]      Request for Surgical Clearance    Procedure:   Inguinal Hernia Surgery  Date of Surgery:  Clearance TBD                                 Surgeon:  Dr. Ivar Drape Surgeon's Group or Practice Name:  [facility] Phone number:  [phone] Fax number:  [phone]   Type of Clearance Requested:   - Medical  - Pharmacy:  Hold Aspirin and Clopidogrel (Plavix) Not Indicated.   Type of Anesthesia:  General    Additional requests/questions:  Pt was last seen by Dr. Shari Prows on 12/04/2022.  Signed, Emmit Pomfret   12/26/2022, 11:54 AM

## 2022-12-28 ENCOUNTER — Other Ambulatory Visit: Payer: Self-pay

## 2022-12-28 DIAGNOSIS — Z952 Presence of prosthetic heart valve: Secondary | ICD-10-CM

## 2022-12-28 MED ORDER — ROSUVASTATIN CALCIUM 40 MG PO TABS
40.0000 mg | ORAL_TABLET | Freq: Every day | ORAL | 3 refills | Status: DC
Start: 2022-12-28 — End: 2024-02-17

## 2022-12-31 ENCOUNTER — Telehealth: Payer: Self-pay | Admitting: Physical Therapy

## 2022-12-31 ENCOUNTER — Ambulatory Visit: Payer: PPO | Attending: Neurology | Admitting: Physical Therapy

## 2022-12-31 DIAGNOSIS — R2689 Other abnormalities of gait and mobility: Secondary | ICD-10-CM | POA: Insufficient documentation

## 2022-12-31 DIAGNOSIS — M6281 Muscle weakness (generalized): Secondary | ICD-10-CM | POA: Diagnosis not present

## 2022-12-31 DIAGNOSIS — R202 Paresthesia of skin: Secondary | ICD-10-CM | POA: Insufficient documentation

## 2022-12-31 DIAGNOSIS — R251 Tremor, unspecified: Secondary | ICD-10-CM

## 2022-12-31 DIAGNOSIS — R2681 Unsteadiness on feet: Secondary | ICD-10-CM | POA: Insufficient documentation

## 2022-12-31 DIAGNOSIS — G250 Essential tremor: Secondary | ICD-10-CM | POA: Diagnosis not present

## 2022-12-31 DIAGNOSIS — R278 Other lack of coordination: Secondary | ICD-10-CM

## 2022-12-31 NOTE — Telephone Encounter (Signed)
Dr. Terrace Arabia,  [name] was evaluated by Physical Therapy on 12/31/2022.  The patient would benefit from an Occupational Therapy evaluation for UE tremors and impaired hand function.    If you agree, please place an order in [facility] workque in [facility] or fax the order to [phone]. Thank you, Peter Congo, PT, DPT, [facility] [address] [place], [place]  [address] Phone:  [phone] Fax:  [phone]

## 2022-12-31 NOTE — Therapy (Signed)
OUTPATIENT PHYSICAL THERAPY NEURO EVALUATION   Patient Name: Luis Lindsey MRN: [id number] DOB:Jan 28, 1951, 72 y.o., male Today's Date: 12/31/2022   PCP: Irven Coe, MD REFERRING PROVIDER: Levert Feinstein, MD  END OF SESSION:  PT End of Session - 12/31/22 1147     Visit Number 1    Number of Visits 7   with eval   Date for PT Re-Evaluation 03/25/23   to allow for scheduling delays   Authorization Type [employer]    PT Start Time 1146    PT Stop Time 1222   eval   PT Time Calculation (min) 36 min    Equipment Utilized During Treatment Gait belt    Activity Tolerance Patient tolerated treatment well    Behavior During Therapy WFL for tasks assessed/performed             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   Dysphagia    Esophageal dysmotility    GERD (gastroesophageal reflux disease)    Heart murmur    pt had recent echocardiogram   History of hiatal hernia    History of Holter monitoring 2009   History of kidney stones    HNP (herniated nucleus pulposus), lumbar    L4-5   HYPERCHOLESTEROLEMIA 11/30/2009   Hyperlipidemia    HYPERTENSION 11/30/2009   Kidney stones    Numbness and tingling    Peripheral vascular disease (HCC)    Right carotid bruit    SMOKER 11/30/2009   Stroke (HCC) 2012   URINARY CALCULUS 11/30/2009   Past Surgical History:  Procedure Laterality Date   ABDOMINAL AORTOGRAM W/LOWER EXTREMITY N/A 06/18/2018   Procedure: ABDOMINAL AORTOGRAM W/LOWER EXTREMITY;  Surgeon: Cephus Shelling, MD;  Location: [facility];  Service: Cardiovascular;  Laterality: N/A;   ABDOMINAL AORTOGRAM W/LOWER EXTREMITY N/A 12/02/2019   Procedure: ABDOMINAL AORTOGRAM W/LOWER EXTREMITY;  Surgeon: Cephus Shelling, MD;  Location: [facility];  Service:  Cardiovascular;  Laterality: N/A;   ABDOMINAL AORTOGRAM W/LOWER EXTREMITY N/A 03/23/2021   Procedure: ABDOMINAL AORTOGRAM W/LOWER EXTREMITY;  Surgeon: Cephus Shelling, MD;  Location: [facility];  Service: Cardiovascular;  Laterality: N/A;   AORTIC VALVE REPLACEMENT N/A 10/25/2021   Procedure: AORTIC VALVE REPLACEMENT (AVR)USING INSPIRIS RESILIA  AORTIC VALVE;  Surgeon: Corliss Skains, MD;  Location: [facility];  Service: Open Heart Surgery;  Laterality: N/A;   arthroscopy of right shoulder Right    Jan 2019, June 2019-- done at [facility], Dr. Magnus Ivan   BACK SURGERY     CATARACT EXTRACTION W/ INTRAOCULAR LENS  IMPLANT, BILATERAL     CORONARY ARTERY BYPASS GRAFT N/A 10/25/2021   Procedure: CORONARY ARTERY BYPASS GRAFTING (CABG) X 3 USING LEFT INTERNAL MAMMARY ARTERY AND RIGHT GREATER SAPHENOUS VEIN;  Surgeon: Corliss Skains, MD;  Location: [facility];  Service: Open Heart Surgery;  Laterality: N/A;   CYSTOSCOPY     ENDARTERECTOMY Right 08/24/2019   Procedure: ENDARTERECTOMY CAROTID;  Surgeon: Cephus Shelling, MD;  Location: [facility] OR;  Service: Vascular;  Laterality: Right;   ENDARTERECTOMY FEMORAL Left 06/05/2021   Procedure: ENDARTERECTOMY FEMORAL WITH PROFUNDAPLASTY;  Surgeon: Cephus Shelling, MD;  Location: [facility] OR;  Service: Vascular;  Laterality: Left;  ENDOVEIN HARVEST OF GREATER SAPHENOUS VEIN Right 10/25/2021   Procedure: ENDOVEIN HARVEST OF GREATER SAPHENOUS VEIN;  Surgeon: Corliss Skains, MD;  Location: [facility];  Service: Open Heart Surgery;  Laterality: Right;   FACIAL COSMETIC SURGERY  2014   FEMORAL-POPLITEAL BYPASS GRAFT Left 06/05/2021   Procedure: LEFT FEMORAL-POPLITEAL ARTERY BYPASS GRAFTING USING 6mm PROPATEN VASCULAR REMOVABLE RING GRAFT;  Surgeon: Cephus Shelling, MD;  Location: [facility];  Service: Vascular;  Laterality: Left;   FINGER SURGERY  2002   LEFT HEART CATH AND CORONARY ANGIOGRAPHY N/A 10/05/2021   Procedure: LEFT HEART  CATH AND CORONARY ANGIOGRAPHY;  Surgeon: Yvonne Kendall, MD;  [facility]: [facility];  Service: Cardiovascular;  Laterality: N/A;   LUMBAR LAMINECTOMY/DECOMPRESSION MICRODISCECTOMY Left 08/01/2018   Procedure: Microdiscectomy - Lumbar four-Lumbar five - left;  Surgeon: Tia Alert, MD;  Location: [facility] OR;  Service: Neurosurgery;  Laterality: Left;   LUMBAR LAMINECTOMY/DECOMPRESSION MICRODISCECTOMY Left 10/15/2018   Procedure: Re-do Microdiscectomy - left - Lumbar four-Lumbar five;  Surgeon: Tia Alert, MD;  Location: [facility] OR;  Service: Neurosurgery;  Laterality: Left;   NECK SURGERY  1986   PATCH ANGIOPLASTY Left 06/05/2021   Procedure: PATCH ANGIOPLASTY USING Livia Snellen BIOLOGIC PATCH;  Surgeon: Cephus Shelling, MD;  Location: [facility] OR;  Service: Vascular;  Laterality: Left;   PERIPHERAL VASCULAR ATHERECTOMY  12/02/2019   Procedure: PERIPHERAL VASCULAR ATHERECTOMY;  Surgeon: Cephus Shelling, MD;  [facility]: [facility];  Service: Cardiovascular;;  left SFA   PERIPHERAL VASCULAR INTERVENTION  06/18/2018   Procedure: PERIPHERAL VASCULAR INTERVENTION;  Surgeon: Cephus Shelling, MD;  [facility];  Service: Cardiovascular;;  Left external iliac   ROTATOR CUFF REPAIR Left    TEE WITHOUT CARDIOVERSION N/A 10/25/2021   Procedure: TRANSESOPHAGEAL ECHOCARDIOGRAM (TEE);  Surgeon: Corliss Skains, MD;  Location: [facility] OR;  Service: Open Heart Surgery;  Laterality: N/A;   UMBILICAL HERNIA REPAIR     Patient Active Problem List   Diagnosis Date Noted   Essential tremor 12/26/2022   Paresthesia 12/26/2022   Hypokalemia 03/01/2022   Hypoalbuminemia 03/01/2022   Abnormal LFTs 03/01/2022   Symptomatic anemia 02/27/2022   S/P CABG x 3 02/27/2022   Iron deficiency anemia 02/27/2022   Complication of surgical procedure 02/13/2022   Degeneration of lumbar intervertebral disc 02/13/2022   S/P AVR (aortic valve replacement) 10/25/2021   Coronary artery disease of native  artery of native heart with stable angina pectoris (HCC)    Abnormal cardiac CT angiography    PAD (peripheral artery disease) (HCC) 06/05/2021   S/P lumbar fusion 12/07/2020   Peripheral vascular disease (HCC) 04/06/2020   Cerebrovascular accident (CVA) (HCC) 04/06/2020   S/P carotid endarterectomy 04/06/2020   DM type 2 with diabetic peripheral neuropathy (HCC) 12/31/2019   Gait abnormality 12/31/2019   Tremor 12/07/2019   Carotid stenosis, asymptomatic, right 08/24/2019   Carotid disease, bilateral (HCC) 08/11/2019   S/P lumbar laminectomy 08/01/2018   PVD (peripheral vascular disease) (HCC) 06/10/2018   Left lumbar radiculopathy 04/01/2018   Peripheral neuropathy 04/01/2018   Left leg claudication (HCC) 04/01/2018   Status post arthroscopy of right shoulder 06/27/2017   Superior glenoid labrum lesion of right shoulder 06/10/2017   Chronic right shoulder pain 04/16/2017   Displaced fracture of proximal phalanx of right great toe with routine healing 03/13/2017   Impingement syndrome of right shoulder 03/13/2017   Displaced fracture of proximal phalanx of right great toe, initial encounter for closed fracture 02/20/2017  Diabetes (HCC) 08/24/2013   History of Holter monitoring    Heart murmur    TIA (transient ischemic attack) 12/26/2012   Kidney stones    DM 11/30/2009   HYPERCHOLESTEROLEMIA 11/30/2009   Essential hypertension 11/30/2009   URINARY CALCULUS 11/30/2009   CAROTID BRUIT, RIGHT 11/30/2009    ONSET DATE: 12/26/2022 (referral date)  REFERRING DIAG: G25.0 (ICD-10-CM) - Essential tremor R20.2 (ICD-10-CM) - Paresthesia  THERAPY DIAG:  Muscle weakness (generalized)  Other abnormalities of gait and mobility  Other lack of coordination  Unsteadiness on feet  Paresthesia of skin  Tremor  Rationale for Evaluation and Treatment: Rehabilitation  SUBJECTIVE:                                                                                                                                                                                              SUBJECTIVE STATEMENT: Pt presents with N/T in both feet for the past 3 years, reports it has gradually gotten worse. Pt reports that this nerve pain initially started in just his L foot, now has it in both feet and up his LLE to his knee. Pt reports that if he is standing still and goes to turn his foot doesn't turn with him and he almost falls.   Pt also reports he has been having tremors in his hands, started in his R hand and now has it in his L hand and his legs. Pt reports that the tremors also started about 3 years ago and have gotten worse gradually as well. Pt reports that he gets the tremors in his legs when he is driving, he feels like he has "shaking" at rest but the tremors increase when he tries to use the limb or hold something heavy.  Pt did have back surgery in 2022, had pain/weakness in his LLE prior to surgery and noticed no difference following surgery.  Pt also has an inguinal hernia on his L side, not scheduled for surgery yet but was told to avoid heavy lifting.   Pt accompanied by: self  PERTINENT HISTORY:  PMH of PAD s/p L SFA arthrectomy with angioplasty, left CEA, HTN, HLD, moderate AS  Neuropathy: burning tingling bilateral feet (ongoing 3 yrs), worsening tremor" "shaking in hands and legs (ongoing 2 years)  PAIN:  Are you having pain? Yes: NPRS scale: not rated/10 Pain location: in feet Pain description: nerve pain, stabbing/burning; comes at night and wakes him up Aggravating factors: N/A Relieving factors: gabapentin helps a bit  PRECAUTIONS: Fall and Other: hernia-avoid heaving lifting  RED FLAGS: Bowel or bladder incontinence: No   WEIGHT BEARING RESTRICTIONS: No  FALLS:  Has patient fallen in last 6 months? No  LIVING ENVIRONMENT: Lives with: lives with their spouse [name] Lives in: House/apartment Stairs: Yes: External: 1 steps; none Has following equipment at  home: None  PLOF: Independent with gait and Independent with transfers  PATIENT GOALS: "I'm not sure"  OBJECTIVE:   DIAGNOSTIC FINDINGS:  Nerve conduction study and EMG scheduled at [facility] 02/22/23  COGNITION: Overall cognitive status: Within functional limits for tasks assessed   SENSATION: Impaired in BLE (distal RLE, knee down in LLE)  COORDINATION: WFL in BUE and BLE   LOWER EXTREMITY MMT:    MMT Right Eval Left Eval  Hip flexion 5 3  Hip extension    Hip abduction    Hip adduction    Hip internal rotation    Hip external rotation    Knee flexion 5 3  Knee extension 5 3  Ankle dorsiflexion 4 3  Ankle plantarflexion    Ankle inversion    Ankle eversion    (Blank rows = not tested)  BED MOBILITY:  Mod I per pt report  TRANSFERS: Assistive device utilized: None  Sit to stand: Complete Independence Stand to sit: Complete Independence Chair to chair: Complete Independence Floor:  not assessed at initial eval  STAIRS: Level of Assistance: SBA Stair Negotiation Technique: Alternating Pattern  with Single Rail on Right Number of Stairs: 6  Height of Stairs: 4  Comments: WFL  GAIT: Gait pattern: decreased hip/knee flexion- Left and decreased ankle dorsiflexion- Left Distance walked: various clinic distances Assistive device utilized: None Level of assistance: Modified independence Comments: to be further assessed in functional context  FUNCTIONAL TESTS:    [facility] PT Assessment - 12/31/22 1202       Ambulation/Gait   Gait velocity 32.8 ft over 9.75 sec = 3.36 ft/sec      Standardized Balance Assessment   Standardized Balance Assessment Timed Up and Go Test;Five Times Sit to Stand    Five times sit to stand comments  18.31 sec   hands on arms of chair     Timed Up and Go Test   TUG Normal TUG    Normal TUG (seconds) 11.43   no AD     Functional Gait  Assessment   Gait assessed  Yes    Gait Level Surface Walks 20 ft in less than 7 sec but greater  than 5.5 sec, uses assistive device, slower speed, mild gait deviations, or deviates 6-10 in outside of the 12 in walkway width.    Change in Gait Speed Able to smoothly change walking speed without loss of balance or gait deviation. Deviate no more than 6 in outside of the 12 in walkway width.    Gait with Horizontal Head Turns Performs head turns smoothly with slight change in gait velocity (eg, minor disruption to smooth gait path), deviates 6-10 in outside 12 in walkway width, or uses an assistive device.    Gait with Vertical Head Turns Performs task with slight change in gait velocity (eg, minor disruption to smooth gait path), deviates 6 - 10 in outside 12 in walkway width or uses assistive device    Gait and Pivot Turn Turns slowly, requires verbal cueing, or requires several small steps to catch balance following turn and stop    Step Over Obstacle Is able to step over one shoe box (4.5 in total height) but must slow down and adjust steps to clear box safely. May require verbal cueing.    Gait with Narrow Base  of Support Ambulates less than 4 steps heel to toe or cannot perform without assistance.    Gait with Eyes Closed Walks 20 ft, uses assistive device, slower speed, mild gait deviations, deviates 6-10 in outside 12 in walkway width. Ambulates 20 ft in less than 9 sec but greater than 7 sec.    Ambulating Backwards Walks 20 ft, uses assistive device, slower speed, mild gait deviations, deviates 6-10 in outside 12 in walkway width.    Steps Alternating feet, must use rail.    Total Score 17    FGA comment: 17/30            mCTSIB: 30 sec for all 4 conditions  TODAY'S TREATMENT:                                                                                                                              PT Evaluation    PATIENT EDUCATION: Education details: Eval findings, PT POC, results of OM and functional implications Person educated: Patient Education method:  Explanation Education comprehension: verbalized understanding and needs further education  HOME EXERCISE PROGRAM: To be initiated  GOALS: Goals reviewed with patient? Yes  SHORT TERM GOALS: Target date: 01/21/2023  Pt will be independent with initial HEP for improved strength, balance, transfers and gait. Baseline: Goal status: INITIAL  2.  Pt will improve FGA to 21/30 for decreased fall risk  Baseline: 17/30 (8/5) Goal status: INITIAL   LONG TERM GOALS: Target date: 02/11/2023   Pt will be independent with final HEP for improved strength, balance, transfers and gait. Baseline:  Goal status: INITIAL  2.  Pt will improve 5 x STS to less than or equal to 15 seconds to demonstrate improved functional strength and transfer efficiency.  Baseline: 18.31 sec with BUE support (8/5) Goal status: INITIAL  3.  Pt will improve FGA to 25/30 for decreased fall risk  Baseline: 17/30 (8/5) Goal status: INITIAL   ASSESSMENT:  CLINICAL IMPRESSION: Patient is a 72 year old male referred to Neuro OPPT for BLE parasthesias and an essential tremor.   Pt's PMH is significant for: PAD s/p L SFA arthrectomy with angioplasty, left CEA, HTN, HLD, moderate AS. The following deficits were present during the exam: impaired LE sensation, impaired LE strength, and impaired balance. Based on his sensory impairments and FGA score of 17/30, pt is an increased risk for falls. Pt would benefit from skilled PT to address these impairments and functional limitations to maximize functional mobility independence.   OBJECTIVE IMPAIRMENTS: Abnormal gait, decreased balance, decreased knowledge of condition, decreased strength, impaired sensation, impaired UE functional use, and pain.   ACTIVITY LIMITATIONS: carrying, lifting, and stairs  PARTICIPATION LIMITATIONS: meal prep, cleaning, driving, and community activity  PERSONAL FACTORS: Age, Time since onset of injury/illness/exacerbation, and 1-2 comorbidities:     PAD s/p L SFA arthrectomy with angioplasty, left CEA, HTN, HLD, moderate ASare also affecting patient's functional outcome.   REHAB POTENTIAL: Fair time  since onset, diabetic peripheral neuropathy and/or history of lumbar radiculopathy s/p lumbar decompression  CLINICAL DECISION MAKING: Stable/uncomplicated  EVALUATION COMPLEXITY: Low  PLAN:  PT FREQUENCY: 1x/week  PT DURATION: 6 weeks  PLANNED INTERVENTIONS: Therapeutic exercises, Therapeutic activity, Neuromuscular re-education, Balance training, Gait training, Patient/Family education, Self Care, Joint mobilization, Orthotic/Fit training, DME instructions, Aquatic Therapy, Dry Needling, Electrical stimulation, Cryotherapy, Moist heat, Taping, Manual therapy, and Re-evaluation  PLAN FOR NEXT SESSION: initiate HEP for balance (turns, obstacles, tandem, EC), initiate TENS for neuropathy, did we get OT referral? Is he interested in aquatic therapy?    Peter Congo, PT, DPT, CSRS  12/31/2022, 12:23 PM

## 2023-01-01 DIAGNOSIS — L570 Actinic keratosis: Secondary | ICD-10-CM | POA: Diagnosis not present

## 2023-01-01 DIAGNOSIS — C4441 Basal cell carcinoma of skin of scalp and neck: Secondary | ICD-10-CM | POA: Diagnosis not present

## 2023-01-01 NOTE — Telephone Encounter (Signed)
Orders Placed This Encounter  ?Procedures  ? Ambulatory referral to Occupational Therapy  ? ?   ?

## 2023-01-01 NOTE — Telephone Encounter (Signed)
Patient is calling stating that [facility] did not receive the clearance and is requesting this be sent again.

## 2023-01-01 NOTE — Telephone Encounter (Signed)
Clearance was re-faxed 

## 2023-01-02 ENCOUNTER — Ambulatory Visit: Payer: PPO | Admitting: Cardiology

## 2023-01-08 ENCOUNTER — Ambulatory Visit: Payer: HMO | Admitting: Internal Medicine

## 2023-01-09 ENCOUNTER — Encounter: Payer: Self-pay | Admitting: Physical Therapy

## 2023-01-09 ENCOUNTER — Ambulatory Visit: Payer: PPO | Admitting: Physical Therapy

## 2023-01-09 VITALS — BP 147/56 | HR 82

## 2023-01-09 DIAGNOSIS — R2689 Other abnormalities of gait and mobility: Secondary | ICD-10-CM

## 2023-01-09 DIAGNOSIS — M6281 Muscle weakness (generalized): Secondary | ICD-10-CM | POA: Diagnosis not present

## 2023-01-09 DIAGNOSIS — R2681 Unsteadiness on feet: Secondary | ICD-10-CM

## 2023-01-09 NOTE — Therapy (Signed)
OUTPATIENT PHYSICAL THERAPY NEURO TREATMENT   Patient Name: Luis Lindsey MRN: [id number] DOB:02-Nov-1950, 72 y.o., male Today's Date: 01/09/2023   PCP: Irven Coe, MD REFERRING PROVIDER: Levert Feinstein, MD  END OF SESSION:  PT End of Session - 01/09/23 0932     Visit Number 2    Number of Visits 7   with eval   Date for PT Re-Evaluation 03/25/23   to allow for scheduling delays   Authorization Type [employer]    PT Start Time 0931    PT Stop Time 1011    PT Time Calculation (min) 40 min    Equipment Utilized During Treatment Gait belt    Activity Tolerance Patient tolerated treatment well    Behavior During Therapy WFL for tasks assessed/performed             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   Dysphagia    Esophageal dysmotility    GERD (gastroesophageal reflux disease)    Heart murmur    pt had recent echocardiogram   History of hiatal hernia    History of Holter monitoring 2009   History of kidney stones    HNP (herniated nucleus pulposus), lumbar    L4-5   HYPERCHOLESTEROLEMIA 11/30/2009   Hyperlipidemia    HYPERTENSION 11/30/2009   Kidney stones    Numbness and tingling    Peripheral vascular disease (HCC)    Right carotid bruit    SMOKER 11/30/2009   Stroke (HCC) 2012   URINARY CALCULUS 11/30/2009   Past Surgical History:  Procedure Laterality Date   ABDOMINAL AORTOGRAM W/LOWER EXTREMITY N/A 06/18/2018   Procedure: ABDOMINAL AORTOGRAM W/LOWER EXTREMITY;  Surgeon: Cephus Shelling, MD;  Location: [facility];  Service: Cardiovascular;  Laterality: N/A;   ABDOMINAL AORTOGRAM W/LOWER EXTREMITY N/A 12/02/2019   Procedure: ABDOMINAL AORTOGRAM W/LOWER EXTREMITY;  Surgeon: Cephus Shelling, MD;  Location: [facility];  Service:  Cardiovascular;  Laterality: N/A;   ABDOMINAL AORTOGRAM W/LOWER EXTREMITY N/A 03/23/2021   Procedure: ABDOMINAL AORTOGRAM W/LOWER EXTREMITY;  Surgeon: Cephus Shelling, MD;  Location: [facility];  Service: Cardiovascular;  Laterality: N/A;   AORTIC VALVE REPLACEMENT N/A 10/25/2021   Procedure: AORTIC VALVE REPLACEMENT (AVR)USING INSPIRIS RESILIA  AORTIC VALVE;  Surgeon: Corliss Skains, MD;  Location: [facility];  Service: Open Heart Surgery;  Laterality: N/A;   arthroscopy of right shoulder Right    Jan 2019, June 2019-- done at [facility], Dr. Magnus Ivan   BACK SURGERY     CATARACT EXTRACTION W/ INTRAOCULAR LENS  IMPLANT, BILATERAL     CORONARY ARTERY BYPASS GRAFT N/A 10/25/2021   Procedure: CORONARY ARTERY BYPASS GRAFTING (CABG) X 3 USING LEFT INTERNAL MAMMARY ARTERY AND RIGHT GREATER SAPHENOUS VEIN;  Surgeon: Corliss Skains, MD;  Location: [facility];  Service: Open Heart Surgery;  Laterality: N/A;   CYSTOSCOPY     ENDARTERECTOMY Right 08/24/2019   Procedure: ENDARTERECTOMY CAROTID;  Surgeon: Cephus Shelling, MD;  Location: [facility] OR;  Service: Vascular;  Laterality: Right;   ENDARTERECTOMY FEMORAL Left 06/05/2021   Procedure: ENDARTERECTOMY FEMORAL WITH PROFUNDAPLASTY;  Surgeon: Cephus Shelling, MD;  Location: [facility] OR;  Service: Vascular;  Laterality: Left;  ENDOVEIN HARVEST OF GREATER SAPHENOUS VEIN Right 10/25/2021   Procedure: ENDOVEIN HARVEST OF GREATER SAPHENOUS VEIN;  Surgeon: Corliss Skains, MD;  Location: [facility];  Service: Open Heart Surgery;  Laterality: Right;   FACIAL COSMETIC SURGERY  2014   FEMORAL-POPLITEAL BYPASS GRAFT Left 06/05/2021   Procedure: LEFT FEMORAL-POPLITEAL ARTERY BYPASS GRAFTING USING 6mm PROPATEN VASCULAR REMOVABLE RING GRAFT;  Surgeon: Cephus Shelling, MD;  Location: [facility];  Service: Vascular;  Laterality: Left;   FINGER SURGERY  2002   LEFT HEART CATH AND CORONARY ANGIOGRAPHY N/A 10/05/2021   Procedure: LEFT HEART  CATH AND CORONARY ANGIOGRAPHY;  Surgeon: Yvonne Kendall, MD;  [facility]: [facility];  Service: Cardiovascular;  Laterality: N/A;   LUMBAR LAMINECTOMY/DECOMPRESSION MICRODISCECTOMY Left 08/01/2018   Procedure: Microdiscectomy - Lumbar four-Lumbar five - left;  Surgeon: Tia Alert, MD;  Location: [facility] OR;  Service: Neurosurgery;  Laterality: Left;   LUMBAR LAMINECTOMY/DECOMPRESSION MICRODISCECTOMY Left 10/15/2018   Procedure: Re-do Microdiscectomy - left - Lumbar four-Lumbar five;  Surgeon: Tia Alert, MD;  Location: [facility] OR;  Service: Neurosurgery;  Laterality: Left;   NECK SURGERY  1986   PATCH ANGIOPLASTY Left 06/05/2021   Procedure: PATCH ANGIOPLASTY USING Livia Snellen BIOLOGIC PATCH;  Surgeon: Cephus Shelling, MD;  Location: [facility] OR;  Service: Vascular;  Laterality: Left;   PERIPHERAL VASCULAR ATHERECTOMY  12/02/2019   Procedure: PERIPHERAL VASCULAR ATHERECTOMY;  Surgeon: Cephus Shelling, MD;  [facility]: [facility];  Service: Cardiovascular;;  left SFA   PERIPHERAL VASCULAR INTERVENTION  06/18/2018   Procedure: PERIPHERAL VASCULAR INTERVENTION;  Surgeon: Cephus Shelling, MD;  [facility];  Service: Cardiovascular;;  Left external iliac   ROTATOR CUFF REPAIR Left    TEE WITHOUT CARDIOVERSION N/A 10/25/2021   Procedure: TRANSESOPHAGEAL ECHOCARDIOGRAM (TEE);  Surgeon: Corliss Skains, MD;  Location: [facility] OR;  Service: Open Heart Surgery;  Laterality: N/A;   UMBILICAL HERNIA REPAIR     Patient Active Problem List   Diagnosis Date Noted   Essential tremor 12/26/2022   Paresthesia 12/26/2022   Hypokalemia 03/01/2022   Hypoalbuminemia 03/01/2022   Abnormal LFTs 03/01/2022   Symptomatic anemia 02/27/2022   S/P CABG x 3 02/27/2022   Iron deficiency anemia 02/27/2022   Complication of surgical procedure 02/13/2022   Degeneration of lumbar intervertebral disc 02/13/2022   S/P AVR (aortic valve replacement) 10/25/2021   Coronary artery disease of native  artery of native heart with stable angina pectoris (HCC)    Abnormal cardiac CT angiography    PAD (peripheral artery disease) (HCC) 06/05/2021   S/P lumbar fusion 12/07/2020   Peripheral vascular disease (HCC) 04/06/2020   Cerebrovascular accident (CVA) (HCC) 04/06/2020   S/P carotid endarterectomy 04/06/2020   DM type 2 with diabetic peripheral neuropathy (HCC) 12/31/2019   Gait abnormality 12/31/2019   Tremor 12/07/2019   Carotid stenosis, asymptomatic, right 08/24/2019   Carotid disease, bilateral (HCC) 08/11/2019   S/P lumbar laminectomy 08/01/2018   PVD (peripheral vascular disease) (HCC) 06/10/2018   Left lumbar radiculopathy 04/01/2018   Peripheral neuropathy 04/01/2018   Left leg claudication (HCC) 04/01/2018   Status post arthroscopy of right shoulder 06/27/2017   Superior glenoid labrum lesion of right shoulder 06/10/2017   Chronic right shoulder pain 04/16/2017   Displaced fracture of proximal phalanx of right great toe with routine healing 03/13/2017   Impingement syndrome of right shoulder 03/13/2017   Displaced fracture of proximal phalanx of right great toe, initial encounter for closed fracture 02/20/2017  Diabetes (HCC) 08/24/2013   History of Holter monitoring    Heart murmur    TIA (transient ischemic attack) 12/26/2012   Kidney stones    DM 11/30/2009   HYPERCHOLESTEROLEMIA 11/30/2009   Essential hypertension 11/30/2009   URINARY CALCULUS 11/30/2009   CAROTID BRUIT, RIGHT 11/30/2009    ONSET DATE: 12/26/2022 (referral date)  REFERRING DIAG: G25.0 (ICD-10-CM) - Essential tremor R20.2 (ICD-10-CM) - Paresthesia  THERAPY DIAG:  Muscle weakness (generalized)  Other abnormalities of gait and mobility  Unsteadiness on feet  Rationale for Evaluation and Treatment: Rehabilitation  SUBJECTIVE:                                                                                                                                                                                              SUBJECTIVE STATEMENT: No changes, no falls. When going to the L, will feel like he might lose his balance.   Pt accompanied by: self  PERTINENT HISTORY:  PMH of PAD s/p L SFA arthrectomy with angioplasty, left CEA, HTN, HLD, moderate AS  Neuropathy: burning tingling bilateral feet (ongoing 3 yrs), worsening tremor" "shaking in hands and legs (ongoing 2 years)  PAIN:  Are you having pain? Yes: NPRS scale: not rated/10 Pain location: in feet Pain description: nerve pain, stabbing/burning; comes at night and wakes him up Aggravating factors: N/A Relieving factors: gabapentin helps a bit  Vitals:   01/09/23 0937  BP: (!) 147/56  Pulse: 82     PRECAUTIONS: Fall and Other: hernia-avoid heaving lifting  RED FLAGS: Bowel or bladder incontinence: No   WEIGHT BEARING RESTRICTIONS: No  FALLS: Has patient fallen in last 6 months? No  LIVING ENVIRONMENT: Lives with: lives with their spouse [name] Lives in: House/apartment Stairs: Yes: External: 1 steps; none Has following equipment at home: None  PLOF: Independent with gait and Independent with transfers  PATIENT GOALS: "I'm not sure"  OBJECTIVE:   DIAGNOSTIC FINDINGS:  Nerve conduction study and EMG scheduled at [facility] 02/22/23  COGNITION: Overall cognitive status: Within functional limits for tasks assessed   SENSATION: Impaired in BLE (distal RLE, knee down in LLE)  COORDINATION: WFL in BUE and BLE   LOWER EXTREMITY MMT:    MMT Right Eval Left Eval  Hip flexion 5 3  Hip extension    Hip abduction    Hip adduction    Hip internal rotation    Hip external rotation    Knee flexion 5 3  Knee extension 5 3  Ankle dorsiflexion 4 3  Ankle plantarflexion    Ankle inversion    Ankle eversion    (Blank rows =  not tested)  BED MOBILITY:  Mod I per pt report  TRANSFERS: Assistive device utilized: None  Sit to stand: Complete Independence Stand to sit: Complete Independence Chair to  chair: Complete Independence Floor:  not assessed at initial eval  STAIRS: Level of Assistance: SBA Stair Negotiation Technique: Alternating Pattern  with Single Rail on Right Number of Stairs: 6  Height of Stairs: 4  Comments: WFL  GAIT: Gait pattern: decreased hip/knee flexion- Left and decreased ankle dorsiflexion- Left Distance walked: various clinic distances Assistive device utilized: None Level of assistance: Modified independence Comments: to be further assessed in functional context   TODAY'S TREATMENT:                                                                                                                              Access Code: [email or web address] URL: [email or web address] Date: 01/09/2023 Prepared by: Sherlie Ban  Exercises - Tandem Walking with Counter Support  - 1-2 x daily - 5 x weekly - 3 sets - Standing Marching  - 1-2 x daily - 5 x weekly - 3 sets - Tandem Stance  - 1-2 x daily - 5 x weekly - 3 sets - 20-30 hold - Romberg Stance Eyes Closed on Foam Pad  - 1-2 x daily - 5 x weekly - 3 sets - 30 hold - Standing Balance with Eyes Closed on Foam  - 1-2 x daily - 5 x weekly - 1 sets - 10 reps - 10 reps head turns, 10 reps head nods   On air ex: feet together EO x10 reps head turns, x10 reps head nods, more unsteady with head nods  On blue foam beam: Side stepping down and back x2 reps without UE support, cues for incr hip flexion for SLS time Alternating forward cone taps to 2 cones for SLS, x10 reps each leg, then forward and cross body cone tap x8 reps each leg, pt more unsteady with LLE  10 reps sit <> stands without UE support for immediate standing balance, and controlled eccentric lowering back down to mat  With feet width medicine ball slams and catch to floor x5 reps and progressing to more narrow BOS   PATIENT EDUCATION: Education details:Initial HEP, discussed potential of aquatic therapy (potentially in future if pt could benefit more  outside of land therapy)  Person educated: Patient Education method: Explanation, Demonstration, Verbal cues, and Handouts Education comprehension: verbalized understanding and needs further education  HOME EXERCISE PROGRAM: Access Code: [email or web address] URL: [email or web address] Date: 01/09/2023 Prepared by: Sherlie Ban  Exercises - Tandem Walking with Counter Support  - 1-2 x daily - 5 x weekly - 3 sets - Standing Marching  - 1-2 x daily - 5 x weekly - 3 sets - Tandem Stance  - 1-2 x daily - 5 x weekly - 3 sets - 20-30 hold - Romberg Stance Eyes Closed on Foam Pad  - 1-2 x daily - 5 x weekly - 3 sets -  30 hold - Standing Balance with Eyes Closed on Foam  - 1-2 x daily - 5 x weekly - 1 sets - 10 reps  GOALS: Goals reviewed with patient? Yes  SHORT TERM GOALS: Target date: 01/21/2023  Pt will be independent with initial HEP for improved strength, balance, transfers and gait. Baseline: Goal status: INITIAL  2.  Pt will improve FGA to 21/30 for decreased fall risk  Baseline: 17/30 (8/5) Goal status: INITIAL   LONG TERM GOALS: Target date: 02/11/2023   Pt will be independent with final HEP for improved strength, balance, transfers and gait. Baseline:  Goal status: INITIAL  2.  Pt will improve 5 x STS to less than or equal to 15 seconds to demonstrate improved functional strength and transfer efficiency.  Baseline: 18.31 sec with BUE support (8/5) Goal status: INITIAL  3.  Pt will improve FGA to 25/30 for decreased fall risk  Baseline: 17/30 (8/5) Goal status: INITIAL   ASSESSMENT:  CLINICAL IMPRESSION: Today's skilled session focused on initiating HEP for balance including SLS, narrow BOS, and balance with vision removed on compliant surfaces. Remainder of session focused on balance tasks on unlevel surfaces. Pt more challenged with SLS tasks on LLE. Pt able to perform sit <> stands without UE support today (needed to use BUE support at eval). Will continue  per POC.    OBJECTIVE IMPAIRMENTS: Abnormal gait, decreased balance, decreased knowledge of condition, decreased strength, impaired sensation, impaired UE functional use, and pain.   ACTIVITY LIMITATIONS: carrying, lifting, and stairs  PARTICIPATION LIMITATIONS: meal prep, cleaning, driving, and community activity  PERSONAL FACTORS: Age, Time since onset of injury/illness/exacerbation, and 1-2 comorbidities:    PAD s/p L SFA arthrectomy with angioplasty, left CEA, HTN, HLD, moderate ASare also affecting patient's functional outcome.   REHAB POTENTIAL: Fair time since onset, diabetic peripheral neuropathy and/or history of lumbar radiculopathy s/p lumbar decompression  CLINICAL DECISION MAKING: Stable/uncomplicated  EVALUATION COMPLEXITY: Low  PLAN:  PT FREQUENCY: 1x/week  PT DURATION: 6 weeks  PLANNED INTERVENTIONS: Therapeutic exercises, Therapeutic activity, Neuromuscular re-education, Balance training, Gait training, Patient/Family education, Self Care, Joint mobilization, Orthotic/Fit training, DME instructions, Aquatic Therapy, Dry Needling, Electrical stimulation, Cryotherapy, Moist heat, Taping, Manual therapy, and Re-evaluation  PLAN FOR NEXT SESSION: work on balance (turns, obstacles, tandem, EC, SLS), initiate TENS for neuropathy, Is he interested in aquatic therapy (potentially)      , PT, DPT 01/09/23 10:14 AM

## 2023-01-11 NOTE — Progress Notes (Signed)
Fax received from [facility] on 12/26/22 for medical clearance/medication hold for inguinal hernia surgery to be signed by C. Chestine Spore, MD.  Provider signed on 01/08/23, form faxed back to sender on 01/08/23, verified successful, sent to scan center.

## 2023-01-15 NOTE — Progress Notes (Addendum)
Anesthesia Review:  PCP: Shari Prows- LOV 11/23/22  Cardiologist : Laurance Flatten L[facility] 12/04/22  Neuro- DR Terrace Arabia LOV 12/26/22  Chest x-ray : EKG : 12/04/22  Echo : 11/28/22  Carotids- 11/20/22  Stress test: Cardiac Cath :  10/05/21  Ct Cors- 10/11/21  Activity level:  Sleep Study/ CPAP : Fasting Blood Sugar :      / Checks Blood Sugar -- times a day:   Blood Thinner/ Instructions Maurice Small Dose: ASA / Instructions/ Last Dose :    DM- type 2 Hgba1c- 12/26/22- 7.2    Tresiba- Take 1/2 am of surgery  Metformin- None am of procedure   Actos- None day of surgery  Ozempic- Hold for 7 days prior to procedure    ORders requested on 01/16/23 from [facility].

## 2023-01-16 ENCOUNTER — Ambulatory Visit: Payer: Self-pay | Admitting: Surgery

## 2023-01-16 ENCOUNTER — Ambulatory Visit: Payer: PPO | Admitting: Physical Therapy

## 2023-01-16 VITALS — BP 118/49 | HR 78

## 2023-01-16 DIAGNOSIS — R278 Other lack of coordination: Secondary | ICD-10-CM

## 2023-01-16 DIAGNOSIS — R2681 Unsteadiness on feet: Secondary | ICD-10-CM

## 2023-01-16 DIAGNOSIS — R2689 Other abnormalities of gait and mobility: Secondary | ICD-10-CM

## 2023-01-16 DIAGNOSIS — M6281 Muscle weakness (generalized): Secondary | ICD-10-CM | POA: Diagnosis not present

## 2023-01-16 DIAGNOSIS — R251 Tremor, unspecified: Secondary | ICD-10-CM

## 2023-01-16 NOTE — Therapy (Signed)
OUTPATIENT PHYSICAL THERAPY NEURO TREATMENT   Patient Name: Luis Lindsey MRN: [id number] DOB:03-20-1951, 72 y.o., male Today's Date: 01/16/2023   PCP: Irven Coe, MD REFERRING PROVIDER: Levert Feinstein, MD  END OF SESSION:  PT End of Session - 01/16/23 1151     Visit Number 3    Number of Visits 7   with eval   Date for PT Re-Evaluation 03/25/23   to allow for scheduling delays   Authorization Type [employer]    PT Start Time 1150    PT Stop Time 1230    PT Time Calculation (min) 40 min    Equipment Utilized During Treatment Gait belt    Activity Tolerance Patient tolerated treatment well    Behavior During Therapy WFL for tasks assessed/performed              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   Dysphagia    Esophageal dysmotility    GERD (gastroesophageal reflux disease)    Heart murmur    pt had recent echocardiogram   History of hiatal hernia    History of Holter monitoring 2009   History of kidney stones    HNP (herniated nucleus pulposus), lumbar    L4-5   HYPERCHOLESTEROLEMIA 11/30/2009   Hyperlipidemia    HYPERTENSION 11/30/2009   Kidney stones    Numbness and tingling    Peripheral vascular disease (HCC)    Right carotid bruit    SMOKER 11/30/2009   Stroke (HCC) 2012   URINARY CALCULUS 11/30/2009   Past Surgical History:  Procedure Laterality Date   ABDOMINAL AORTOGRAM W/LOWER EXTREMITY N/A 06/18/2018   Procedure: ABDOMINAL AORTOGRAM W/LOWER EXTREMITY;  Surgeon: Cephus Shelling, MD;  [facility];  Service: Cardiovascular;  Laterality: N/A;   ABDOMINAL AORTOGRAM W/LOWER EXTREMITY N/A 12/02/2019   Procedure: ABDOMINAL AORTOGRAM W/LOWER EXTREMITY;  Surgeon: Cephus Shelling, MD;  [facility]: [facility];  Service:  Cardiovascular;  Laterality: N/A;   ABDOMINAL AORTOGRAM W/LOWER EXTREMITY N/A 03/23/2021   Procedure: ABDOMINAL AORTOGRAM W/LOWER EXTREMITY;  Surgeon: Cephus Shelling, MD;  [facility]: [facility];  Service: Cardiovascular;  Laterality: N/A;   AORTIC VALVE REPLACEMENT N/A 10/25/2021   Procedure: AORTIC VALVE REPLACEMENT (AVR)USING INSPIRIS RESILIA  AORTIC VALVE;  Surgeon: Corliss Skains, MD;  Location: [facility];  Service: Open Heart Surgery;  Laterality: N/A;   arthroscopy of right shoulder Right    Jan 2019, June 2019-- done at [facility], Dr. Magnus Ivan   BACK SURGERY     CATARACT EXTRACTION W/ INTRAOCULAR LENS  IMPLANT, BILATERAL     CORONARY ARTERY BYPASS GRAFT N/A 10/25/2021   Procedure: CORONARY ARTERY BYPASS GRAFTING (CABG) X 3 USING LEFT INTERNAL MAMMARY ARTERY AND RIGHT GREATER SAPHENOUS VEIN;  Surgeon: Corliss Skains, MD;  [facility]: [facility];  Service: Open Heart Surgery;  Laterality: N/A;   CYSTOSCOPY     ENDARTERECTOMY Right 08/24/2019   Procedure: ENDARTERECTOMY CAROTID;  Surgeon: Cephus Shelling, MD;  Location: [facility] OR;  Service: Vascular;  Laterality: Right;   ENDARTERECTOMY FEMORAL Left 06/05/2021   Procedure: ENDARTERECTOMY FEMORAL WITH PROFUNDAPLASTY;  Surgeon: Cephus Shelling, MD;  Location: [facility] OR;  Service: Vascular;  Laterality: Left;  ENDOVEIN HARVEST OF GREATER SAPHENOUS VEIN Right 10/25/2021   Procedure: ENDOVEIN HARVEST OF GREATER SAPHENOUS VEIN;  Surgeon: Corliss Skains, MD;  [facility];  Service: Open Heart Surgery;  Laterality: Right;   FACIAL COSMETIC SURGERY  2014   FEMORAL-POPLITEAL BYPASS GRAFT Left 06/05/2021   Procedure: LEFT FEMORAL-POPLITEAL ARTERY BYPASS GRAFTING USING 6mm PROPATEN VASCULAR REMOVABLE RING GRAFT;  Surgeon: Cephus Shelling, MD;  [facility];  Service: Vascular;  Laterality: Left;   FINGER SURGERY  2002   LEFT HEART CATH AND CORONARY ANGIOGRAPHY N/A 10/05/2021   Procedure: LEFT HEART  CATH AND CORONARY ANGIOGRAPHY;  Surgeon: Yvonne Kendall, MD;  [facility];  Service: Cardiovascular;  Laterality: N/A;   LUMBAR LAMINECTOMY/DECOMPRESSION MICRODISCECTOMY Left 08/01/2018   Procedure: Microdiscectomy - Lumbar four-Lumbar five - left;  Surgeon: Tia Alert, MD;  Location: [facility] OR;  Service: Neurosurgery;  Laterality: Left;   LUMBAR LAMINECTOMY/DECOMPRESSION MICRODISCECTOMY Left 10/15/2018   Procedure: Re-do Microdiscectomy - left - Lumbar four-Lumbar five;  Surgeon: Tia Alert, MD;  Location: [facility] OR;  Service: Neurosurgery;  Laterality: Left;   NECK SURGERY  1986   PATCH ANGIOPLASTY Left 06/05/2021   Procedure: PATCH ANGIOPLASTY USING Livia Snellen BIOLOGIC PATCH;  Surgeon: Cephus Shelling, MD;  Location: [facility] OR;  Service: Vascular;  Laterality: Left;   PERIPHERAL VASCULAR ATHERECTOMY  12/02/2019   Procedure: PERIPHERAL VASCULAR ATHERECTOMY;  Surgeon: Cephus Shelling, MD;  [facility];  Service: Cardiovascular;;  left SFA   PERIPHERAL VASCULAR INTERVENTION  06/18/2018   Procedure: PERIPHERAL VASCULAR INTERVENTION;  Surgeon: Cephus Shelling, MD;  [facility];  Service: Cardiovascular;;  Left external iliac   ROTATOR CUFF REPAIR Left    TEE WITHOUT CARDIOVERSION N/A 10/25/2021   Procedure: TRANSESOPHAGEAL ECHOCARDIOGRAM (TEE);  Surgeon: Corliss Skains, MD;  Location: [facility] OR;  Service: Open Heart Surgery;  Laterality: N/A;   UMBILICAL HERNIA REPAIR     Patient Active Problem List   Diagnosis Date Noted   Essential tremor 12/26/2022   Paresthesia 12/26/2022   Hypokalemia 03/01/2022   Hypoalbuminemia 03/01/2022   Abnormal LFTs 03/01/2022   Symptomatic anemia 02/27/2022   S/P CABG x 3 02/27/2022   Iron deficiency anemia 02/27/2022   Complication of surgical procedure 02/13/2022   Degeneration of lumbar intervertebral disc 02/13/2022   S/P AVR (aortic valve replacement) 10/25/2021   Coronary artery disease of native  artery of native heart with stable angina pectoris (HCC)    Abnormal cardiac CT angiography    PAD (peripheral artery disease) (HCC) 06/05/2021   S/P lumbar fusion 12/07/2020   Peripheral vascular disease (HCC) 04/06/2020   Cerebrovascular accident (CVA) (HCC) 04/06/2020   S/P carotid endarterectomy 04/06/2020   DM type 2 with diabetic peripheral neuropathy (HCC) 12/31/2019   Gait abnormality 12/31/2019   Tremor 12/07/2019   Carotid stenosis, asymptomatic, right 08/24/2019   Carotid disease, bilateral (HCC) 08/11/2019   S/P lumbar laminectomy 08/01/2018   PVD (peripheral vascular disease) (HCC) 06/10/2018   Left lumbar radiculopathy 04/01/2018   Peripheral neuropathy 04/01/2018   Left leg claudication (HCC) 04/01/2018   Status post arthroscopy of right shoulder 06/27/2017   Superior glenoid labrum lesion of right shoulder 06/10/2017   Chronic right shoulder pain 04/16/2017   Displaced fracture of proximal phalanx of right great toe with routine healing 03/13/2017   Impingement syndrome of right shoulder 03/13/2017   Displaced fracture of proximal phalanx of right great toe, initial encounter for closed fracture 02/20/2017  Diabetes (HCC) 08/24/2013   History of Holter monitoring    Heart murmur    TIA (transient ischemic attack) 12/26/2012   Kidney stones    DM 11/30/2009   HYPERCHOLESTEROLEMIA 11/30/2009   Essential hypertension 11/30/2009   URINARY CALCULUS 11/30/2009   CAROTID BRUIT, RIGHT 11/30/2009    ONSET DATE: 12/26/2022 (referral date)  REFERRING DIAG: G25.0 (ICD-10-CM) - Essential tremor R20.2 (ICD-10-CM) - Paresthesia  THERAPY DIAG:  Muscle weakness (generalized)  Other abnormalities of gait and mobility  Unsteadiness on feet  Other lack of coordination  Tremor  Rationale for Evaluation and Treatment: Rehabilitation  SUBJECTIVE:                                                                                                                                                                                              SUBJECTIVE STATEMENT: Pt reports he has been feeling sick (nauseous), feels like his balance has been more off, legs feel more rubbery. Pt reports no falls, no pain. Pt reports he did have did have "close call" where he caught himself on the wall with his arm, legs gave out on him.  Pt reports last time he felt like this his hemoglobin was low, plans to reach out to his PCP. Pt does have blood work scheduled tomorrow morning for pre-surgery procedure, has hernia surgery 9/6.  Pt accompanied by: self  PERTINENT HISTORY:  PMH of PAD s/p L SFA arthrectomy with angioplasty, left CEA, HTN, HLD, moderate AS  Neuropathy: burning tingling bilateral feet (ongoing 3 yrs), worsening tremor" "shaking in hands and legs (ongoing 2 years)  PAIN:  Are you having pain? Yes: NPRS scale: not rated/10 Pain location: in feet Pain description: nerve pain, stabbing/burning; comes at night and wakes him up Aggravating factors: N/A Relieving factors: gabapentin helps a bit  Vitals:   01/16/23 1157 01/16/23 1202  BP: (!) 115/50 (!) 118/49  Pulse: 69 78      PRECAUTIONS: Fall and Other: hernia-avoid heaving lifting  RED FLAGS: Bowel or bladder incontinence: No   WEIGHT BEARING RESTRICTIONS: No  FALLS: Has patient fallen in last 6 months? No  LIVING ENVIRONMENT: Lives with: lives with their spouse [name] Lives in: House/apartment Stairs: Yes: External: 1 steps; none Has following equipment at home: None  PLOF: Independent with gait and Independent with transfers  PATIENT GOALS: "I'm not sure"  OBJECTIVE:   DIAGNOSTIC FINDINGS:  Nerve conduction study and EMG scheduled at [facility] 02/22/23  COGNITION: Overall cognitive status: Within functional limits for tasks assessed   SENSATION: Impaired in BLE (distal RLE, knee down in LLE)  COORDINATION: WFL in BUE and  BLE   LOWER EXTREMITY MMT:    MMT Right Eval Left Eval  Hip  flexion 5 3  Hip extension    Hip abduction    Hip adduction    Hip internal rotation    Hip external rotation    Knee flexion 5 3  Knee extension 5 3  Ankle dorsiflexion 4 3  Ankle plantarflexion    Ankle inversion    Ankle eversion    (Blank rows = not tested)  BED MOBILITY:  Mod I per pt report  TRANSFERS: Assistive device utilized: None  Sit to stand: Complete Independence Stand to sit: Complete Independence Chair to chair: Complete Independence Floor:  not assessed at initial eval  STAIRS: Level of Assistance: SBA Stair Negotiation Technique: Alternating Pattern  with Single Rail on Right Number of Stairs: 6  Height of Stairs: 4  Comments: WFL  GAIT: Gait pattern: decreased hip/knee flexion- Left and decreased ankle dorsiflexion- Left Distance walked: various clinic distances Assistive device utilized: None Level of assistance: Modified independence Comments: to be further assessed in functional context   TODAY'S TREATMENT:                                                                                                                              TherAct Vitals:   01/16/23 1157 01/16/23 1202  BP: (!) 115/50 (!) 118/49  Pulse: 69 78   BP initially assessed in sitting. Pt does endorse some slight dizziness/lightheadedness with standing. Standing BP assessed, no significant change.   To work on SLS stability and LE coordination in // bars with no UE support and SBA: 4" alt L/R step taps 2 x 10 reps 6" alt L/R step taps 2 x 10 reps Alt L/R gumdrop taps 2 x 5 reps Pt with most difficulty standing on LLE as compared to RLE due to more impaired sensation in this limb   E-Stim Use of TENS to address peripheral neuropathy pain in BLE TENS level 37 to lateral aspect of dorsum of R foot TENS level 52 to medial aspect of dorsum of L foot, after a few min unable to detect sensation so increased to level 63  Pt with no adverse skin reactions following removal of  electrodes. Pt may benefit from trial again of TENS in future sessions. Pt does report some ongoing estim sensation following removal of electrodes.   PATIENT EDUCATION: Education details: estim/TENS, pt will need medical clearance to resume therapy following his hernia surgery on 9/6 Person educated: Patient Education method: Explanation, Demonstration, Verbal cues, and Handouts Education comprehension: verbalized understanding and needs further education  HOME EXERCISE PROGRAM: Access Code: [email or web address] URL: [email or web address] Date: 01/09/2023 Prepared by: Sherlie Ban  Exercises - Tandem Walking with Counter Support  - 1-2 x daily - 5 x weekly - 3 sets - Standing Marching  - 1-2 x daily - 5 x weekly - 3 sets - Tandem Stance  - 1-2 x daily - 5 x  weekly - 3 sets - 20-30 hold - Romberg Stance Eyes Closed on Foam Pad  - 1-2 x daily - 5 x weekly - 3 sets - 30 hold - Standing Balance with Eyes Closed on Foam  - 1-2 x daily - 5 x weekly - 1 sets - 10 reps  GOALS: Goals reviewed with patient? Yes  SHORT TERM GOALS: Target date: 01/21/2023  Pt will be independent with initial HEP for improved strength, balance, transfers and gait. Baseline: Goal status: INITIAL  2.  Pt will improve FGA to 21/30 for decreased fall risk  Baseline: 17/30 (8/5) Goal status: INITIAL   LONG TERM GOALS: Target date: 02/11/2023   Pt will be independent with final HEP for improved strength, balance, transfers and gait. Baseline:  Goal status: INITIAL  2.  Pt will improve 5 x STS to less than or equal to 15 seconds to demonstrate improved functional strength and transfer efficiency.  Baseline: 18.31 sec with BUE support (8/5) Goal status: INITIAL  3.  Pt will improve FGA to 25/30 for decreased fall risk  Baseline: 17/30 (8/5) Goal status: INITIAL   ASSESSMENT:  CLINICAL IMPRESSION: Emphasis of skilled PT session on monitoring vitals due to pt reports of dizziness this date as well  as initiation of use of TENS to address neuropathic pain in B feet. Pt with good response to TENS this date and could benefit from further trials of this modality in future sessions. Pt is somewhat limited by his fatigue this date, to have bloodwork done tomorrow. Pt continues to benefit from skilled therapy services to work towards increased independence with management of neuropathic symptoms as well as improving his balance in order to decrease his fall risk. Continue POC.    OBJECTIVE IMPAIRMENTS: Abnormal gait, decreased balance, decreased knowledge of condition, decreased strength, impaired sensation, impaired UE functional use, and pain.   ACTIVITY LIMITATIONS: carrying, lifting, and stairs  PARTICIPATION LIMITATIONS: meal prep, cleaning, driving, and community activity  PERSONAL FACTORS: Age, Time since onset of injury/illness/exacerbation, and 1-2 comorbidities:    PAD s/p L SFA arthrectomy with angioplasty, left CEA, HTN, HLD, moderate ASare also affecting patient's functional outcome.   REHAB POTENTIAL: Fair time since onset, diabetic peripheral neuropathy and/or history of lumbar radiculopathy s/p lumbar decompression  CLINICAL DECISION MAKING: Stable/uncomplicated  EVALUATION COMPLEXITY: Low  PLAN:  PT FREQUENCY: 1x/week  PT DURATION: 6 weeks  PLANNED INTERVENTIONS: Therapeutic exercises, Therapeutic activity, Neuromuscular re-education, Balance training, Gait training, Patient/Family education, Self Care, Joint mobilization, Orthotic/Fit training, DME instructions, Aquatic Therapy, Dry Needling, Electrical stimulation, Cryotherapy, Moist heat, Taping, Manual therapy, and Re-evaluation  PLAN FOR NEXT SESSION: work on balance (turns, obstacles, tandem, EC, SLS), TENS for neuropathy, Is he interested in aquatic therapy (potentially)     Peter Congo, PT, DPT, CSRS  01/16/23 12:32 PM

## 2023-01-16 NOTE — Patient Instructions (Addendum)
SURGICAL WAITING ROOM VISITATION  Patients having surgery or a procedure may have no more than 2 support people in the waiting area - these visitors may rotate.    Children under the age of 37 must have an adult with them who is not the patient.  Due to an increase in RSV and influenza rates and associated hospitalizations, children ages 20 and under may not visit patients in [facility] hospitals.  If the patient needs to stay at the hospital during part of their recovery, the visitor guidelines for inpatient rooms apply. Pre-op nurse will coordinate an appropriate time for 1 support person to accompany patient in pre-op.  This support person may not rotate.    Please refer to the [facility] website for the visitor guidelines for Inpatients (after your surgery is over and you are in a regular room).       Your procedure is scheduled on:  02/01/2023    Report to [facility] Main Entrance    Report to admitting at  0730 AM   Call this number if you have problems the morning of surgery [phone]   Do not eat food :After Midnight.   After Midnight you may have the following liquids until __ 0630____ AM  DAY OF SURGERY  Water Non-Citrus Juices (without pulp, NO RED-Apple, White grape, White cranberry) Black Coffee (NO MILK/CREAM OR CREAMERS, sugar ok)  Clear Tea (NO MILK/CREAM OR CREAMERS, sugar ok) regular and decaf                             Plain Jell-O (NO RED)                                           Fruit ices (not with fruit pulp, NO RED)                                     Popsicles (NO RED)                                                               Sports drinks like Gatorade (NO RED)                 Oral Hygiene is also important to reduce your risk of infection.                                    Remember - BRUSH YOUR TEETH THE MORNING OF SURGERY WITH YOUR REGULAR TOOTHPASTE  DENTURES WILL BE REMOVED PRIOR TO SURGERY PLEASE DO NOT APPLY "Poly grip"  OR ADHESIVES!!!   Do NOT smoke after Midnight   Stop all vitamins and herbal supplements 7 days before surgery.   Take these medicines the morning of surgery with A SIP OF WATER:  amlodipine, gabapentin, protonix              Tresiba- Take 1/2 dose am of surgery  Metformin- None day of surgery             Actos- none day of surgery            Ozempic- Stop 7 days prior to surgery last dose on   DO NOT TAKE ANY ORAL DIABETIC MEDICATIONS DAY OF YOUR SURGERY             You may not have any metal on your body including hair pins, jewelry, and body piercing             Do not wear make-up, lotions, powders, perfumes/cologne, or deodorant              Men may shave face and neck.   Do not bring valuables to the hospital. [place] IS NOT             RESPONSIBLE   FOR VALUABLES.   Contacts, glasses, dentures or bridgework may not be worn into surgery.  DO NOT BRING YOUR HOME MEDICATIONS TO THE HOSPITAL. PHARMACY WILL DISPENSE MEDICATIONS LISTED ON YOUR MEDICATION LIST TO YOU DURING YOUR ADMISSION IN THE HOSPITAL!    Patients discharged on the day of surgery will not be allowed to drive home.  Someone NEEDS to stay with you for the first 24 hours after anesthesia.   Special Instructions: Bring a copy of your healthcare power of attorney and living will documents the day of surgery if you haven't scanned them before.              Please read over the following fact sheets you were given: IF YOU HAVE QUESTIONS ABOUT YOUR PRE-OP INSTRUCTIONS PLEASE CALL [phone] Rosey [facility]   If you received a COVID test during your pre-op visit  it is requested that you wear a mask when out in public, stay away from anyone that may not be feeling well and notify your surgeon if you develop symptoms. If you test positive for Covid or have been in contact with anyone that has tested positive in the last 10 days please notify you surgeon.    [place] - Preparing for Surgery Before surgery,  you can play an important role.  Because skin is not sterile, your skin needs to be as free of germs as possible.  You can reduce the number of germs on your skin by washing with CHG (chlorahexidine gluconate) soap before surgery.  CHG is an antiseptic cleaner which kills germs and bonds with the skin to continue killing germs even after washing. Please DO NOT use if you have an allergy to CHG or antibacterial soaps.  If your skin becomes reddened/irritated stop using the CHG and inform your nurse when you arrive at Short Stay. Do not shave (including legs and underarms) for at least 48 hours prior to the first CHG shower.  You may shave your face/neck. Please follow these instructions carefully:  1.  Shower with CHG Soap the night before surgery and the  morning of Surgery.  2.  If you choose to wash your hair, wash your hair first as usual with your  normal  shampoo.  3.  After you shampoo, rinse your hair and body thoroughly to remove the  shampoo.                           4.  Use CHG as you would any other liquid soap.  You can apply chg directly  to the skin and wash  Gently with a scrungie or clean washcloth.  5.  Apply the CHG Soap to your body ONLY FROM THE NECK DOWN.   Do not use on face/ open                           Wound or open sores. Avoid contact with eyes, ears mouth and genitals (private parts).                       Wash face,  Genitals (private parts) with your normal soap.             6.  Wash thoroughly, paying special attention to the area where your surgery  will be performed.  7.  Thoroughly rinse your body with warm water from the neck down.  8.  DO NOT shower/wash with your normal soap after using and rinsing off  the CHG Soap.                9.  Pat yourself dry with a clean towel.            10.  Wear clean pajamas.            11.  Place clean sheets on your bed the night of your first shower and do not  sleep with pets. Day of Surgery : Do not  apply any lotions/deodorants the morning of surgery.  Please wear clean clothes to the hospital/surgery center.  FAILURE TO FOLLOW THESE INSTRUCTIONS MAY RESULT IN THE CANCELLATION OF YOUR SURGERY PATIENT SIGNATURE_________________________________  NURSE SIGNATURE__________________________________  ________________________________________________________________________

## 2023-01-17 ENCOUNTER — Other Ambulatory Visit: Payer: Self-pay

## 2023-01-17 ENCOUNTER — Encounter (HOSPITAL_COMMUNITY): Payer: Self-pay

## 2023-01-17 ENCOUNTER — Encounter (HOSPITAL_COMMUNITY)
Admission: RE | Admit: 2023-01-17 | Discharge: 2023-01-17 | Disposition: A | Discharge status: Home / Self Care | Payer: PPO | Source: Ambulatory Visit | Attending: Surgery | Admitting: Surgery

## 2023-01-17 DIAGNOSIS — Z01818 Encounter for other preprocedural examination: Secondary | ICD-10-CM | POA: Insufficient documentation

## 2023-01-17 LAB — BASIC METABOLIC PANEL
Anion gap: 9 (ref 5–15)
BUN: 31 mg/dL — ABNORMAL HIGH (ref 8–23)
CO2: 24 mmol/L (ref 22–32)
Calcium: 9.1 mg/dL (ref 8.9–10.3)
Chloride: 101 mmol/L (ref 98–111)
Creatinine, Ser: 1.26 mg/dL — ABNORMAL HIGH (ref 0.61–1.24)
GFR, Estimated: >60 mL/min (ref >60)
Glucose, Bld: 237 mg/dL — ABNORMAL HIGH (ref 70–99)
Potassium: 3.7 mmol/L (ref 3.5–5.1)
Sodium: 134 mmol/L — ABNORMAL LOW (ref 135–145)

## 2023-01-17 LAB — CBC
HCT: 37.3 % — ABNORMAL LOW (ref 39.0–52.0)
Hemoglobin: 12 g/dL — ABNORMAL LOW (ref 13.0–17.0)
MCH: 29.4 pg (ref 26.0–34.0)
MCHC: 32.2 g/dL (ref 30.0–36.0)
MCV: 91.4 fL (ref 80.0–100.0)
Platelets: 207 10*3/uL (ref 150–400)
RBC: 4.08 MIL/uL — ABNORMAL LOW (ref 4.22–5.81)
RDW: 14.7 % (ref 11.5–15.5)
WBC: 3.4 10*3/uL — ABNORMAL LOW (ref 4.0–10.5)
nRBC: 0 % (ref 0.0–0.2)

## 2023-01-17 NOTE — Progress Notes (Addendum)
Anesthesia Review:  PCP:  Melton Krebs MD-  Cardiologist : Laurance Flatten LOV 12/04/22  Neuro- DR Terrace Arabia LOV 12/26/22  Cardiac clearance 12/26/22 E. Dick NP  Chest x-ray : EKG : 12/04/22  Echo : 11/28/22  Carotids- 11/20/22  Stress test: Cardiac Cath :  10/05/21  Ct Cors- 10/11/21  Activity level: Able to climb stairs with some SOB Sleep Study/ No Fasting Blood Sugar :      / Checks Blood Sugar -- times a day:  4 with freestyle libre  Blood Thinner/ Stop Plavix 5 days PTS per  Cardiology office.  Stop ASA 7 days PTS per Cardiology office 12/26/22(E. Louanne Skye NP)     DM- type 2 Hgba1c- 12/26/22- 7.2    Evaristo Bury- Take 1/2 am of surgery  Metformin- None am of procedure   Actos- None day of surgery  Ozempic- Hold for 7 days prior to procedure    ORders requested on 01/16/23 from [facility].

## 2023-01-18 NOTE — Progress Notes (Addendum)
Anesthesia Chart Review   Case: [id number] Date/Time: 02/01/23 0915   Procedure: LAPAROSCOPIC LEFT INGUINAL HERNIA REPAIR WITH MESH (Left) - 90   Anesthesia type: General   Pre-op diagnosis: LEFT INGUINAL HERNIA   Location: [facility] ROOM 04 / [facility]   Surgeons: Quentin Ore, MD       DISCUSSION:72 y.o. 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 Ivar Drape.   Per cardiology preoperative evaluation 12/26/2022, "Chart reviewed as part of pre-operative protocol coverage. Given past medical history and time since last visit, based on ACC/AHA guidelines, Luis Lindsey is at acceptable risk for the planned procedure without further cardiovascular testing.    Patient can hold Plavix 5 days prior to surgery and aspirin 7 days prior to surgery and can be restarted postprocedure when surgically safe and hemostasis is achieved."  VS: There were no vitals taken for this visit.  PROVIDERS: Irven Coe, MD is PCP   Primary Cardiologist: Meriam Sprague, MD  LABS: Labs reviewed: Acceptable for surgery. (all labs ordered are listed, but only abnormal results are displayed)  Labs Reviewed - No data to display   IMAGES:   EKG:   CV: Echo 11/28/2022 1. Left ventricular ejection fraction, by estimation, is 50 to 55%. The  left ventricle has low normal function. The left ventricle has no regional  wall motion abnormalities. Left ventricular diastolic parameters are  indeterminate.   2. Right ventricular systolic function is normal. The right ventricular  size is normal. There is  normal pulmonary artery systolic pressure.   3. Left atrial size was mildly dilated.   4. The mitral valve is normal in structure. Trivial mitral valve  regurgitation.   5. S/p 23 mm Inspiris Resilia. No PVL. AV max 2.1 m/s. Mean gradient 10  mmHg. Aortic valve regurgitation is not visualized. Echo findings are  consistent with normal structure and function of the aortic valve  prosthesis.   6. The inferior vena cava is normal in size with greater than 50%  respiratory variability, suggesting right atrial pressure of 3 mmHg.  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   Dysphagia    Esophageal dysmotility    GERD (gastroesophageal reflux disease)    Heart murmur    pt had recent echocardiogram   History of hiatal hernia    History of Holter monitoring 2009   History of kidney stones    HNP (herniated nucleus pulposus), lumbar    L4-5   HYPERCHOLESTEROLEMIA 11/30/2009   Hyperlipidemia    HYPERTENSION 11/30/2009   Kidney stones    Numbness and tingling    Peripheral vascular disease (HCC)    Right carotid bruit    SMOKER 11/30/2009   Stroke (HCC) 2012   URINARY CALCULUS 11/30/2009    Past Surgical History:  Procedure Laterality Date   ABDOMINAL AORTOGRAM W/LOWER EXTREMITY N/A 06/18/2018   Procedure: ABDOMINAL AORTOGRAM  W/LOWER EXTREMITY;  Surgeon: Cephus Shelling, MD;  Location: [facility] INVASIVE CV LAB;  Service: Cardiovascular;  Laterality: N/A;   ABDOMINAL AORTOGRAM W/LOWER EXTREMITY N/A 12/02/2019   Procedure: ABDOMINAL AORTOGRAM W/LOWER EXTREMITY;  Surgeon: Cephus Shelling, MD;  Location: [facility] INVASIVE CV LAB;  Service: Cardiovascular;  Laterality: N/A;   ABDOMINAL AORTOGRAM W/LOWER EXTREMITY N/A 03/23/2021   Procedure: ABDOMINAL AORTOGRAM W/LOWER EXTREMITY;  Surgeon: Cephus Shelling,  MD;  Location: [facility] INVASIVE CV LAB;  Service: Cardiovascular;  Laterality: N/A;   AORTIC VALVE REPLACEMENT N/A 10/25/2021   Procedure: AORTIC VALVE REPLACEMENT (AVR)USING INSPIRIS RESILIA  AORTIC VALVE;  Surgeon: Corliss Skains, MD;  Location: [facility] OR;  Service: Open Heart Surgery;  Laterality: N/A;   arthroscopy of right shoulder Right    Jan 2019, June 2019-- done at [facility], Dr. Magnus Ivan   BACK SURGERY     CATARACT EXTRACTION W/ INTRAOCULAR LENS  IMPLANT, BILATERAL     CORONARY ARTERY BYPASS GRAFT N/A 10/25/2021   Procedure: CORONARY ARTERY BYPASS GRAFTING (CABG) X 3 USING LEFT INTERNAL MAMMARY ARTERY AND RIGHT GREATER SAPHENOUS VEIN;  Surgeon: Corliss Skains, MD;  Location: [facility] OR;  Service: Open Heart Surgery;  Laterality: N/A;   CYSTOSCOPY     ENDARTERECTOMY Right 08/24/2019   Procedure: ENDARTERECTOMY CAROTID;  Surgeon: Cephus Shelling, MD;  Location: [facility] OR;  Service: Vascular;  Laterality: Right;   ENDARTERECTOMY FEMORAL Left 06/05/2021   Procedure: ENDARTERECTOMY FEMORAL WITH PROFUNDAPLASTY;  Surgeon: Cephus Shelling, MD;  Location: [facility] OR;  Service: Vascular;  Laterality: Left;   ENDOVEIN HARVEST OF GREATER SAPHENOUS VEIN Right 10/25/2021   Procedure: ENDOVEIN HARVEST OF GREATER SAPHENOUS VEIN;  Surgeon: Corliss Skains, MD;  Location: [facility] OR;  Service: Open Heart Surgery;  Laterality: Right;   FACIAL COSMETIC SURGERY  2014   FEMORAL-POPLITEAL BYPASS GRAFT Left 06/05/2021   Procedure: LEFT FEMORAL-POPLITEAL ARTERY BYPASS GRAFTING USING 6mm PROPATEN VASCULAR REMOVABLE RING GRAFT;  Surgeon: Cephus Shelling, MD;  Location: [facility];  Service: Vascular;  Laterality: Left;   FINGER SURGERY  2002   LEFT HEART CATH AND CORONARY ANGIOGRAPHY N/A 10/05/2021   Procedure: LEFT HEART CATH AND CORONARY ANGIOGRAPHY;  Surgeon: Yvonne Kendall, MD;  Location: [facility] CV LAB;  Service: Cardiovascular;  Laterality: N/A;   LUMBAR LAMINECTOMY/DECOMPRESSION  MICRODISCECTOMY Left 08/01/2018   Procedure: Microdiscectomy - Lumbar four-Lumbar five - left;  Surgeon: Tia Alert, MD;  Location: [facility] OR;  Service: Neurosurgery;  Laterality: Left;   LUMBAR LAMINECTOMY/DECOMPRESSION MICRODISCECTOMY Left 10/15/2018   Procedure: Re-do Microdiscectomy - left - Lumbar four-Lumbar five;  Surgeon: Tia Alert, MD;  Location: [facility] OR;  Service: Neurosurgery;  Laterality: Left;   NECK SURGERY  1986   PATCH ANGIOPLASTY Left 06/05/2021   Procedure: PATCH ANGIOPLASTY USING Livia Snellen BIOLOGIC PATCH;  Surgeon: Cephus Shelling, MD;  Location: [facility] OR;  Service: Vascular;  Laterality: Left;   PERIPHERAL VASCULAR ATHERECTOMY  12/02/2019   Procedure: PERIPHERAL VASCULAR ATHERECTOMY;  Surgeon: Cephus Shelling, MD;  Location: [facility] INVASIVE CV LAB;  Service: Cardiovascular;;  left SFA   PERIPHERAL VASCULAR INTERVENTION  06/18/2018   Procedure: PERIPHERAL VASCULAR INTERVENTION;  Surgeon: Cephus Shelling, MD;  Location: [facility] INVASIVE CV LAB;  Service: Cardiovascular;;  Left external iliac   ROTATOR CUFF REPAIR Left    TEE WITHOUT CARDIOVERSION N/A 10/25/2021   Procedure: TRANSESOPHAGEAL ECHOCARDIOGRAM (TEE);  Surgeon: Corliss Skains, MD;  Location: [facility] OR;  Service: Open Heart Surgery;  Laterality:  N/A;   UMBILICAL HERNIA REPAIR      MEDICATIONS: No current facility-administered medications for this encounter.    acetaminophen (TYLENOL) 500 MG tablet   amLODipine (NORVASC) 5 MG tablet   aspirin EC 81 MG EC tablet   benazepril (LOTENSIN) 20 MG tablet   clopidogrel (PLAVIX) 75 MG tablet   ezetimibe (ZETIA) 10 MG tablet   ferrous sulfate 325 (65 FE) MG tablet   gabapentin (NEURONTIN) 100 MG capsule   insulin degludec (TRESIBA) 200 UNIT/ML FlexTouch Pen   iron polysaccharides (NIFEREX) 150 MG capsule   metFORMIN (GLUCOPHAGE-XR) 500 MG 24 hr tablet   pantoprazole (PROTONIX) 40 MG tablet   pioglitazone (ACTOS) 45 MG tablet   polyethylene glycol (MIRALAX / GLYCOLAX) 17  g packet   rosuvastatin (CRESTOR) 40 MG tablet   Semaglutide, 1 MG/DOSE, (OZEMPIC, 1 MG/DOSE,) 4 MG/3ML SOPN   Continuous Blood Gluc Sensor (FREESTYLE LIBRE 2 SENSOR) MISC   ONETOUCH VERIO test strip     [employer], PA-C [facility] Pre-Surgical Testing [phone]

## 2023-01-23 ENCOUNTER — Ambulatory Visit: Payer: PPO | Admitting: Occupational Therapy

## 2023-01-23 ENCOUNTER — Encounter: Payer: Self-pay | Admitting: Physical Therapy

## 2023-01-23 ENCOUNTER — Ambulatory Visit: Payer: PPO | Admitting: Physical Therapy

## 2023-01-23 ENCOUNTER — Encounter: Payer: Self-pay | Admitting: Occupational Therapy

## 2023-01-23 VITALS — BP 92/52 | HR 90

## 2023-01-23 DIAGNOSIS — R251 Tremor, unspecified: Secondary | ICD-10-CM

## 2023-01-23 DIAGNOSIS — R2681 Unsteadiness on feet: Secondary | ICD-10-CM

## 2023-01-23 DIAGNOSIS — M6281 Muscle weakness (generalized): Secondary | ICD-10-CM

## 2023-01-23 DIAGNOSIS — R2689 Other abnormalities of gait and mobility: Secondary | ICD-10-CM

## 2023-01-23 DIAGNOSIS — R278 Other lack of coordination: Secondary | ICD-10-CM

## 2023-01-23 NOTE — Therapy (Signed)
OUTPATIENT PHYSICAL THERAPY NEURO TREATMENT   Patient Name: Luis Lindsey MRN: [id number] DOB:06/02/1950, 72 y.o., male Today's Date: 01/23/2023   PCP: Irven Coe, MD REFERRING PROVIDER: Levert Feinstein, MD  END OF SESSION:  PT End of Session - 01/23/23 1022     Visit Number 4    Number of Visits 7   with eval   Date for PT Re-Evaluation 03/25/23   to allow for scheduling delays   Authorization Type [employer]    PT Start Time 1017    PT Stop Time 1100    PT Time Calculation (min) 43 min    Equipment Utilized During Treatment Gait belt    Activity Tolerance Patient tolerated treatment well    Behavior During Therapy WFL for tasks assessed/performed              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   Dysphagia    Esophageal dysmotility    GERD (gastroesophageal reflux disease)    Heart murmur    pt had recent echocardiogram   History of hiatal hernia    History of Holter monitoring 2009   History of kidney stones    HNP (herniated nucleus pulposus), lumbar    L4-5   HYPERCHOLESTEROLEMIA 11/30/2009   Hyperlipidemia    HYPERTENSION 11/30/2009   Kidney stones    Numbness and tingling    Peripheral vascular disease (HCC)    Right carotid bruit    SMOKER 11/30/2009   Stroke (HCC) 2012   URINARY CALCULUS 11/30/2009   Past Surgical History:  Procedure Laterality Date   ABDOMINAL AORTOGRAM W/LOWER EXTREMITY N/A 06/18/2018   Procedure: ABDOMINAL AORTOGRAM W/LOWER EXTREMITY;  Surgeon: Cephus Shelling, MD;  Location: [facility];  Service: Cardiovascular;  Laterality: N/A;   ABDOMINAL AORTOGRAM W/LOWER EXTREMITY N/A 12/02/2019   Procedure: ABDOMINAL AORTOGRAM W/LOWER EXTREMITY;  Surgeon: Cephus Shelling, MD;  Location: [facility];  Service:  Cardiovascular;  Laterality: N/A;   ABDOMINAL AORTOGRAM W/LOWER EXTREMITY N/A 03/23/2021   Procedure: ABDOMINAL AORTOGRAM W/LOWER EXTREMITY;  Surgeon: Cephus Shelling, MD;  Location: [facility];  Service: Cardiovascular;  Laterality: N/A;   AORTIC VALVE REPLACEMENT N/A 10/25/2021   Procedure: AORTIC VALVE REPLACEMENT (AVR)USING INSPIRIS RESILIA  AORTIC VALVE;  Surgeon: Corliss Skains, MD;  Location: [facility];  Service: Open Heart Surgery;  Laterality: N/A;   arthroscopy of right shoulder Right    Jan 2019, June 2019-- done at [facility], Dr. Magnus Ivan   BACK SURGERY     CATARACT EXTRACTION W/ INTRAOCULAR LENS  IMPLANT, BILATERAL     CORONARY ARTERY BYPASS GRAFT N/A 10/25/2021   Procedure: CORONARY ARTERY BYPASS GRAFTING (CABG) X 3 USING LEFT INTERNAL MAMMARY ARTERY AND RIGHT GREATER SAPHENOUS VEIN;  Surgeon: Corliss Skains, MD;  Location: [facility];  Service: Open Heart Surgery;  Laterality: N/A;   CYSTOSCOPY     ENDARTERECTOMY Right 08/24/2019   Procedure: ENDARTERECTOMY CAROTID;  Surgeon: Cephus Shelling, MD;  Location: [facility] OR;  Service: Vascular;  Laterality: Right;   ENDARTERECTOMY FEMORAL Left 06/05/2021   Procedure: ENDARTERECTOMY FEMORAL WITH PROFUNDAPLASTY;  Surgeon: Cephus Shelling, MD;  Location: [facility] OR;  Service: Vascular;  Laterality: Left;  ENDOVEIN HARVEST OF GREATER SAPHENOUS VEIN Right 10/25/2021   Procedure: ENDOVEIN HARVEST OF GREATER SAPHENOUS VEIN;  Surgeon: Corliss Skains, MD;  Location: [facility];  Service: Open Heart Surgery;  Laterality: Right;   FACIAL COSMETIC SURGERY  2014   FEMORAL-POPLITEAL BYPASS GRAFT Left 06/05/2021   Procedure: LEFT FEMORAL-POPLITEAL ARTERY BYPASS GRAFTING USING 6mm PROPATEN VASCULAR REMOVABLE RING GRAFT;  Surgeon: Cephus Shelling, MD;  Location: [facility];  Service: Vascular;  Laterality: Left;   FINGER SURGERY  2002   LEFT HEART CATH AND CORONARY ANGIOGRAPHY N/A 10/05/2021   Procedure: LEFT HEART  CATH AND CORONARY ANGIOGRAPHY;  Surgeon: Yvonne Kendall, MD;  [facility]: [facility];  Service: Cardiovascular;  Laterality: N/A;   LUMBAR LAMINECTOMY/DECOMPRESSION MICRODISCECTOMY Left 08/01/2018   Procedure: Microdiscectomy - Lumbar four-Lumbar five - left;  Surgeon: Tia Alert, MD;  Location: [facility] OR;  Service: Neurosurgery;  Laterality: Left;   LUMBAR LAMINECTOMY/DECOMPRESSION MICRODISCECTOMY Left 10/15/2018   Procedure: Re-do Microdiscectomy - left - Lumbar four-Lumbar five;  Surgeon: Tia Alert, MD;  Location: [facility] OR;  Service: Neurosurgery;  Laterality: Left;   NECK SURGERY  1986   PATCH ANGIOPLASTY Left 06/05/2021   Procedure: PATCH ANGIOPLASTY USING Livia Snellen BIOLOGIC PATCH;  Surgeon: Cephus Shelling, MD;  Location: [facility] OR;  Service: Vascular;  Laterality: Left;   PERIPHERAL VASCULAR ATHERECTOMY  12/02/2019   Procedure: PERIPHERAL VASCULAR ATHERECTOMY;  Surgeon: Cephus Shelling, MD;  [facility]: [facility];  Service: Cardiovascular;;  left SFA   PERIPHERAL VASCULAR INTERVENTION  06/18/2018   Procedure: PERIPHERAL VASCULAR INTERVENTION;  Surgeon: Cephus Shelling, MD;  [facility]: [facility];  Service: Cardiovascular;;  Left external iliac   ROTATOR CUFF REPAIR Left    TEE WITHOUT CARDIOVERSION N/A 10/25/2021   Procedure: TRANSESOPHAGEAL ECHOCARDIOGRAM (TEE);  Surgeon: Corliss Skains, MD;  Location: [facility] OR;  Service: Open Heart Surgery;  Laterality: N/A;   UMBILICAL HERNIA REPAIR     Patient Active Problem List   Diagnosis Date Noted   Essential tremor 12/26/2022   Paresthesia 12/26/2022   Hypokalemia 03/01/2022   Hypoalbuminemia 03/01/2022   Abnormal LFTs 03/01/2022   Symptomatic anemia 02/27/2022   S/P CABG x 3 02/27/2022   Iron deficiency anemia 02/27/2022   Complication of surgical procedure 02/13/2022   Degeneration of lumbar intervertebral disc 02/13/2022   S/P AVR (aortic valve replacement) 10/25/2021   Coronary artery disease of native  artery of native heart with stable angina pectoris (HCC)    Abnormal cardiac CT angiography    PAD (peripheral artery disease) (HCC) 06/05/2021   S/P lumbar fusion 12/07/2020   Peripheral vascular disease (HCC) 04/06/2020   Cerebrovascular accident (CVA) (HCC) 04/06/2020   S/P carotid endarterectomy 04/06/2020   DM type 2 with diabetic peripheral neuropathy (HCC) 12/31/2019   Gait abnormality 12/31/2019   Tremor 12/07/2019   Carotid stenosis, asymptomatic, right 08/24/2019   Carotid disease, bilateral (HCC) 08/11/2019   S/P lumbar laminectomy 08/01/2018   PVD (peripheral vascular disease) (HCC) 06/10/2018   Left lumbar radiculopathy 04/01/2018   Peripheral neuropathy 04/01/2018   Left leg claudication (HCC) 04/01/2018   Status post arthroscopy of right shoulder 06/27/2017   Superior glenoid labrum lesion of right shoulder 06/10/2017   Chronic right shoulder pain 04/16/2017   Displaced fracture of proximal phalanx of right great toe with routine healing 03/13/2017   Impingement syndrome of right shoulder 03/13/2017   Displaced fracture of proximal phalanx of right great toe, initial encounter for closed fracture 02/20/2017  Diabetes (HCC) 08/24/2013   History of Holter monitoring    Heart murmur    TIA (transient ischemic attack) 12/26/2012   Kidney stones    DM 11/30/2009   HYPERCHOLESTEROLEMIA 11/30/2009   Essential hypertension 11/30/2009   URINARY CALCULUS 11/30/2009   CAROTID BRUIT, RIGHT 11/30/2009    ONSET DATE: 12/26/2022 (referral date)  REFERRING DIAG: G25.0 (ICD-10-CM) - Essential tremor R20.2 (ICD-10-CM) - Paresthesia  THERAPY DIAG:  Muscle weakness (generalized)  Other abnormalities of gait and mobility  Unsteadiness on feet  Other lack of coordination  Rationale for Evaluation and Treatment: Rehabilitation  SUBJECTIVE:                                                                                                                                                                                              SUBJECTIVE STATEMENT: Pt reports the TENS helped his right foot pain a lot, still has some in the left off and on.  He is thinking of purchasing a unit for home use.    He reports he is doing fine and any discomfort he has today is from his hernia.  He does state he has a little lightheadedness at onset of session.  He remains unsure about aquatic therapy, but will think about it for possible re-cert.    Pt accompanied by: self  PERTINENT HISTORY:  PMH of PAD s/p L SFA arthrectomy with angioplasty, left CEA, HTN, HLD, moderate AS  Neuropathy: burning tingling bilateral feet (ongoing 3 yrs), worsening tremor" "shaking in hands and legs (ongoing 2 years)  PAIN:  Are you having pain? Yes: NPRS scale: 2-3/10 Pain location: hernia Pain description: aching Aggravating factors: N/A Relieving factors: N/A  Vitals:   01/23/23 1027 01/23/23 1030  BP: (!) 96/49 (!) 92/52  Pulse: 83 90       PRECAUTIONS: Fall and Other: hernia-avoid heaving lifting  RED FLAGS: Bowel or bladder incontinence: No   WEIGHT BEARING RESTRICTIONS: No  FALLS: Has patient fallen in last 6 months? No  LIVING ENVIRONMENT: Lives with: lives with their spouse [name] Lives in: House/apartment Stairs: Yes: External: 1 steps; none Has following equipment at home: None  PLOF: Independent with gait and Independent with transfers  PATIENT GOALS: "I'm not sure"  OBJECTIVE:   DIAGNOSTIC FINDINGS:  Nerve conduction study and EMG scheduled at [facility] 02/22/23  COGNITION: Overall cognitive status: Within functional limits for tasks assessed   SENSATION: Impaired in BLE (distal RLE, knee down in LLE)  COORDINATION: WFL in BUE and BLE   LOWER EXTREMITY MMT:    MMT Right Eval Left Eval  Hip flexion 5 3  Hip extension    Hip abduction    Hip adduction    Hip internal rotation    Hip external rotation    Knee flexion 5 3  Knee extension 5 3  Ankle  dorsiflexion 4 3  Ankle plantarflexion    Ankle inversion    Ankle eversion    (Blank rows = not tested)  BED MOBILITY:  Mod I per pt report  TRANSFERS: Assistive device utilized: None  Sit to stand: Complete Independence Stand to sit: Complete Independence Chair to chair: Complete Independence Floor:  not assessed at initial eval  STAIRS: Level of Assistance: SBA Stair Negotiation Technique: Alternating Pattern  with Single Rail on Right Number of Stairs: 6  Height of Stairs: 4  Comments: WFL  GAIT: Gait pattern: decreased hip/knee flexion- Left and decreased ankle dorsiflexion- Left Distance walked: various clinic distances Assistive device utilized: None Level of assistance: Modified independence Comments: to be further assessed in functional context   TODAY'S TREATMENT:                                                                                                                              TherAct L BP in sitting at onset of session: Vitals:   01/23/23 1027 01/23/23 1030  BP: (!) 96/49 (!) 92/52  Pulse: 83 90   Pt having some mild lightheadedness per report at onset of session.  Reassessed second reading in standing as his initial BP is lower than prior session.  Pt declined water.  He does endorse more dizziness in standing.  Discussed him bringing personal TENS unit if purchased into future session for setup and further education.  Discussed aquatic therapy - purpose, potential goals, and available aquatic schedule days.  Pt remains unsure, but will think about it for potential re-cert in future.  Verbally reviewed HEP, pt has current copy, he states they are getting easier and are now moderately challenging with standing on pillow/foam being hardest tasks.  FGA:  [facility] PT Assessment - 01/23/23 1034       Functional Gait  Assessment   Gait assessed  Yes    Gait Level Surface Walks 20 ft in less than 7 sec but greater than 5.5 sec, uses assistive device,  slower speed, mild gait deviations, or deviates 6-10 in outside of the 12 in walkway width.    Change in Gait Speed Able to change speed, demonstrates mild gait deviations, deviates 6-10 in outside of the 12 in walkway width, or no gait deviations, unable to achieve a major change in velocity, or uses a change in velocity, or uses an assistive device.    Gait with Horizontal Head Turns Performs head turns smoothly with no change in gait. Deviates no more than 6 in outside 12 in walkway width    Gait with Vertical Head Turns Performs task with slight change in gait velocity (eg, minor disruption to smooth gait path), deviates 6 - 10 in outside 12  in walkway width or uses assistive device    Gait and Pivot Turn Pivot turns safely within 3 sec and stops quickly with no loss of balance.    Step Over Obstacle Is able to step over one shoe box (4.5 in total height) without changing gait speed. No evidence of imbalance.    Gait with Narrow Base of Support Ambulates 4-7 steps.    Gait with Eyes Closed Walks 20 ft, uses assistive device, slower speed, mild gait deviations, deviates 6-10 in outside 12 in walkway width. Ambulates 20 ft in less than 9 sec but greater than 7 sec.    Ambulating Backwards Walks 20 ft, no assistive devices, good speed, no evidence for imbalance, normal gait    Steps Alternating feet, must use rail.    Total Score 22    FGA comment: 22/30 = moderate fall risk            -Y balance w/ BUE support x 5 each LE (used dots for external cues), pt has mild dizziness following initial set so had pt sit x3 minutes and drink water with reported improvement in symptoms -Forward and reverse tandem walking on foam beam 6x10' progressing to intermittent touch to bars only -Forward tandem walking on foam beam with 4" hurdle step over 4x10'  PATIENT EDUCATION: Education details: See above in regards to TENS and aquatic therapy.  Continue HEP with planned updates at next session.  Reminder of  next appt. Person educated: Patient Education method: Explanation, Demonstration, Verbal cues, and Handouts Education comprehension: verbalized understanding and needs further education  HOME EXERCISE PROGRAM: Access Code: [email or web address] URL: [email or web address] Date: 01/09/2023 Prepared by: Sherlie Ban  Exercises - Tandem Walking with Counter Support  - 1-2 x daily - 5 x weekly - 3 sets - Standing Marching  - 1-2 x daily - 5 x weekly - 3 sets - Tandem Stance  - 1-2 x daily - 5 x weekly - 3 sets - 20-30 hold - Romberg Stance Eyes Closed on Foam Pad  - 1-2 x daily - 5 x weekly - 3 sets - 30 hold - Standing Balance with Eyes Closed on Foam  - 1-2 x daily - 5 x weekly - 1 sets - 10 reps  GOALS: Goals reviewed with patient? Yes  SHORT TERM GOALS: Target date: 01/21/2023  Pt will be independent with initial HEP for improved strength, balance, transfers and gait. Baseline:  IND (8/28) Goal status: MET  2.  Pt will improve FGA to 21/30 for decreased fall risk  Baseline: 17/30 (8/5); 22/30 (8/28) Goal status: MET   LONG TERM GOALS: Target date: 02/11/2023   Pt will be independent with final HEP for improved strength, balance, transfers and gait. Baseline:  Goal status: INITIAL  2.  Pt will improve 5 x STS to less than or equal to 15 seconds to demonstrate improved functional strength and transfer efficiency.  Baseline: 18.31 sec with BUE support (8/5) Goal status: INITIAL  3.  Pt will improve FGA to 25/30 for decreased fall risk  Baseline: 17/30 (8/5) Goal status: INITIAL   ASSESSMENT:  CLINICAL IMPRESSION: Focus of skilled session on assessing STGs with patient significantly improving his FGA score to 22/30.  His HEP would benefit from updates at next session.  He continues to benefit from skilled PT to address dynamic stability particularly with vertical head nods and narrowed BOS.  He may be interested in purchasing a home TENS unit and doing aquatic therapy  in future.  Overall,  he is making great progress and PT to continue per POC.  OBJECTIVE IMPAIRMENTS: Abnormal gait, decreased balance, decreased knowledge of condition, decreased strength, impaired sensation, impaired UE functional use, and pain.   ACTIVITY LIMITATIONS: carrying, lifting, and stairs  PARTICIPATION LIMITATIONS: meal prep, cleaning, driving, and community activity  PERSONAL FACTORS: Age, Time since onset of injury/illness/exacerbation, and 1-2 comorbidities:    PAD s/p L SFA arthrectomy with angioplasty, left CEA, HTN, HLD, moderate ASare also affecting patient's functional outcome.   REHAB POTENTIAL: Fair time since onset, diabetic peripheral neuropathy and/or history of lumbar radiculopathy s/p lumbar decompression  CLINICAL DECISION MAKING: Stable/uncomplicated  EVALUATION COMPLEXITY: Low  PLAN:  PT FREQUENCY: 1x/week  PT DURATION: 6 weeks  PLANNED INTERVENTIONS: Therapeutic exercises, Therapeutic activity, Neuromuscular re-education, Balance training, Gait training, Patient/Family education, Self Care, Joint mobilization, Orthotic/Fit training, DME instructions, Aquatic Therapy, Dry Needling, Electrical stimulation, Cryotherapy, Moist heat, Taping, Manual therapy, and Re-evaluation  PLAN FOR NEXT SESSION: UPDATE HEP!  work on balance (turns, obstacles, tandem, EC, SLS), TENS for neuropathy, Is he interested in aquatic therapy (potentially)     Camille Bal, PT, DPT  01/23/23 11:06 AM

## 2023-01-23 NOTE — Therapy (Unsigned)
OUTPATIENT OCCUPATIONAL THERAPY NEURO EVALUATION  Patient Name: Luis Lindsey MRN: [id number] DOB:07-06-1950, 72 y.o., male Today's Date: 01/23/2023  PCP: Irven Coe, MD  REFERRING PROVIDER: Levert Feinstein, MD  END OF SESSION:  OT End of Session - 01/23/23 1129     Visit Number 1    Number of Visits 7    Date for OT Re-Evaluation 03/08/23    Authorization Type [employer] - [place], no auth req.    Progress Note Due on Visit 7    OT Start Time 1151    OT Stop Time 1223    OT Time Calculation (min) 32 min    Activity Tolerance Patient tolerated treatment well    Behavior During Therapy WFL for tasks assessed/performed             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   Dysphagia    Esophageal dysmotility    GERD (gastroesophageal reflux disease)    Heart murmur    pt had recent echocardiogram   History of hiatal hernia    History of Holter monitoring 2009   History of kidney stones    HNP (herniated nucleus pulposus), lumbar    L4-5   HYPERCHOLESTEROLEMIA 11/30/2009   Hyperlipidemia    HYPERTENSION 11/30/2009   Kidney stones    Numbness and tingling    Peripheral vascular disease (HCC)    Right carotid bruit    SMOKER 11/30/2009   Stroke (HCC) 2012   URINARY CALCULUS 11/30/2009   Past Surgical History:  Procedure Laterality Date   ABDOMINAL AORTOGRAM W/LOWER EXTREMITY N/A 06/18/2018   Procedure: ABDOMINAL AORTOGRAM W/LOWER EXTREMITY;  Surgeon: Cephus Shelling, MD;  Location: [facility];  Service: Cardiovascular;  Laterality: N/A;   ABDOMINAL AORTOGRAM W/LOWER EXTREMITY N/A 12/02/2019   Procedure: ABDOMINAL AORTOGRAM W/LOWER EXTREMITY;  Surgeon: Cephus Shelling, MD;  Location: [facility];  Service: Cardiovascular;  Laterality: N/A;   ABDOMINAL  AORTOGRAM W/LOWER EXTREMITY N/A 03/23/2021   Procedure: ABDOMINAL AORTOGRAM W/LOWER EXTREMITY;  Surgeon: Cephus Shelling, MD;  Location: [facility];  Service: Cardiovascular;  Laterality: N/A;   AORTIC VALVE REPLACEMENT N/A 10/25/2021   Procedure: AORTIC VALVE REPLACEMENT (AVR)USING INSPIRIS RESILIA  AORTIC VALVE;  Surgeon: Corliss Skains, MD;  Location: [facility];  Service: Open Heart Surgery;  Laterality: N/A;   arthroscopy of right shoulder Right    Jan 2019, June 2019-- done at [facility], Dr. Magnus Ivan   BACK SURGERY     CATARACT EXTRACTION W/ INTRAOCULAR LENS  IMPLANT, BILATERAL     CORONARY ARTERY BYPASS GRAFT N/A 10/25/2021   Procedure: CORONARY ARTERY BYPASS GRAFTING (CABG) X 3 USING LEFT INTERNAL MAMMARY ARTERY AND RIGHT GREATER SAPHENOUS VEIN;  Surgeon: Corliss Skains, MD;  Location: [facility];  Service: Open Heart Surgery;  Laterality: N/A;   CYSTOSCOPY     ENDARTERECTOMY Right 08/24/2019   Procedure: ENDARTERECTOMY CAROTID;  Surgeon: Cephus Shelling, MD;  Location: [facility] OR;  Service: Vascular;  Laterality: Right;   ENDARTERECTOMY FEMORAL Left 06/05/2021   Procedure: ENDARTERECTOMY FEMORAL WITH PROFUNDAPLASTY;  Surgeon: Cephus Shelling, MD;  Location: [facility] OR;  Service: Vascular;  Laterality: Left;   ENDOVEIN HARVEST OF GREATER  SAPHENOUS VEIN Right 10/25/2021   Procedure: ENDOVEIN HARVEST OF GREATER SAPHENOUS VEIN;  Surgeon: Corliss Skains, MD;  Location: [facility];  Service: Open Heart Surgery;  Laterality: Right;   FACIAL COSMETIC SURGERY  2014   FEMORAL-POPLITEAL BYPASS GRAFT Left 06/05/2021   Procedure: LEFT FEMORAL-POPLITEAL ARTERY BYPASS GRAFTING USING 6mm PROPATEN VASCULAR REMOVABLE RING GRAFT;  Surgeon: Cephus Shelling, MD;  Location: [facility];  Service: Vascular;  Laterality: Left;   FINGER SURGERY  2002   LEFT HEART CATH AND CORONARY ANGIOGRAPHY N/A 10/05/2021   Procedure: LEFT HEART CATH AND CORONARY ANGIOGRAPHY;  Surgeon: Yvonne Kendall, MD;  Location: [facility];  Service: Cardiovascular;  Laterality: N/A;   LUMBAR LAMINECTOMY/DECOMPRESSION MICRODISCECTOMY Left 08/01/2018   Procedure: Microdiscectomy - Lumbar four-Lumbar five - left;  Surgeon: Tia Alert, MD;  Location: [facility] OR;  Service: Neurosurgery;  Laterality: Left;   LUMBAR LAMINECTOMY/DECOMPRESSION MICRODISCECTOMY Left 10/15/2018   Procedure: Re-do Microdiscectomy - left - Lumbar four-Lumbar five;  Surgeon: Tia Alert, MD;  Location: [facility] OR;  Service: Neurosurgery;  Laterality: Left;   NECK SURGERY  1986   PATCH ANGIOPLASTY Left 06/05/2021   Procedure: PATCH ANGIOPLASTY USING Livia Snellen BIOLOGIC PATCH;  Surgeon: Cephus Shelling, MD;  Location: [facility] OR;  Service: Vascular;  Laterality: Left;   PERIPHERAL VASCULAR ATHERECTOMY  12/02/2019   Procedure: PERIPHERAL VASCULAR ATHERECTOMY;  Surgeon: Cephus Shelling, MD;  [facility]: [facility];  Service: Cardiovascular;;  left SFA   PERIPHERAL VASCULAR INTERVENTION  06/18/2018   Procedure: PERIPHERAL VASCULAR INTERVENTION;  Surgeon: Cephus Shelling, MD;  [facility]: [facility];  Service: Cardiovascular;;  Left external iliac   ROTATOR CUFF REPAIR Left    TEE WITHOUT CARDIOVERSION N/A 10/25/2021   Procedure: TRANSESOPHAGEAL ECHOCARDIOGRAM (TEE);  Surgeon: Corliss Skains, MD;  Location: [facility] OR;  Service: Open Heart Surgery;  Laterality: N/A;   UMBILICAL HERNIA REPAIR     Patient Active Problem List   Diagnosis Date Noted   Essential tremor 12/26/2022   Paresthesia 12/26/2022   Hypokalemia 03/01/2022   Hypoalbuminemia 03/01/2022   Abnormal LFTs 03/01/2022   Symptomatic anemia 02/27/2022   S/P CABG x 3 02/27/2022   Iron deficiency anemia 02/27/2022   Complication of surgical procedure 02/13/2022   Degeneration of lumbar intervertebral disc 02/13/2022   S/P AVR (aortic valve replacement) 10/25/2021   Coronary artery disease of native artery of native heart with stable angina  pectoris (HCC)    Abnormal cardiac CT angiography    PAD (peripheral artery disease) (HCC) 06/05/2021   S/P lumbar fusion 12/07/2020   Peripheral vascular disease (HCC) 04/06/2020   Cerebrovascular accident (CVA) (HCC) 04/06/2020   S/P carotid endarterectomy 04/06/2020   DM type 2 with diabetic peripheral neuropathy (HCC) 12/31/2019   Gait abnormality 12/31/2019   Tremor 12/07/2019   Carotid stenosis, asymptomatic, right 08/24/2019   Carotid disease, bilateral (HCC) 08/11/2019   S/P lumbar laminectomy 08/01/2018   PVD (peripheral vascular disease) (HCC) 06/10/2018   Left lumbar radiculopathy 04/01/2018   Peripheral neuropathy 04/01/2018   Left leg claudication (HCC) 04/01/2018   Status post arthroscopy of right shoulder 06/27/2017   Superior glenoid labrum lesion of right shoulder 06/10/2017   Chronic right shoulder pain 04/16/2017   Displaced fracture of proximal phalanx of right great toe with routine healing 03/13/2017   Impingement syndrome of right shoulder 03/13/2017   Displaced fracture of proximal phalanx of right great toe, initial encounter for closed fracture 02/20/2017   Diabetes (HCC)  08/24/2013   History of Holter monitoring    Heart murmur    TIA (transient ischemic attack) 12/26/2012   Kidney stones    DM 11/30/2009   HYPERCHOLESTEROLEMIA 11/30/2009   Essential hypertension 11/30/2009   URINARY CALCULUS 11/30/2009   CAROTID BRUIT, RIGHT 11/30/2009    ONSET DATE: 01/01/2023 (date of referral) - noticed tremor in both hands a couple years ago  REFERRING DIAG: M62.81 (ICD-10-CM) - Muscle weakness (generalized) R27.8 (ICD-10-CM) - Other lack of coordination R25.1 (ICD-10-CM) - Tremor  THERAPY DIAG:  Other lack of coordination  Tremor  Rationale for Evaluation and Treatment: Rehabilitation  SUBJECTIVE:   SUBJECTIVE STATEMENT: Difficulty picking up items of any kind of weight due to tremor. This is especially true picking up a bag of groceries with his right  hand. Eating is more difficult and he typically eats with a spoon. Does not feel his R hand has gotten weaker. He has arthritis in both hands.  Pt accompanied by: self  PERTINENT HISTORY: PMH of PAD s/p L SFA arthrectomy with angioplasty, left CEA, HTN, HLD, moderate AS   Neuropathy: burning tingling bilateral feet (ongoing 3 yrs), worsening tremor" "shaking in hands and legs (ongoing 2 years)  PRECAUTIONS: Fall and Other: hernia-avoid heaving lifting   WEIGHT BEARING RESTRICTIONS: No  PAIN:  Are you having pain? No  FALLS: Has patient fallen in last 6 months? No  LIVING ENVIRONMENT: Lives with: lives with their spouse [name] Lives in: House/apartment Stairs: Yes: External: 1 steps; none Has following equipment at home: Environmental consultant, cane  PLOF: Independent; retired [employer] architect; driver  PATIENT GOALS: Reduce affects of tremor on daily tasks  OBJECTIVE:   HAND DOMINANCE: Right  ADLs: Overall ADLs: mod I Eating: uses spoon and bowl mostly UB Dressing: avoids buttons LB Dressing: uses slip on shoes  Equipment: Shower seat with back and Grab bars  IADLs: Shopping: mod I Light housekeeping: mod I Meal Prep: mod I  Community mobility: driving Medication management: mod I Financial management: I Handwriting:  Pt reports his handwriting is about 30% of what it was; writes in cursive  MOBILITY STATUS:  occasional bouts of LOB requiring SBA; no AD  ACTIVITY TOLERANCE: Activity tolerance: good to fair  FUNCTIONAL OUTCOME MEASURES: PSFS: 3.7  Total score = sum of the activity scores/number of activities Minimum detectable change (90%CI) for average score = 2 points Minimum detectable change (90%CI) for single activity score = 3 points   UPPER EXTREMITY ROM:    BUE: WNL  UPPER EXTREMITY MMT:     BUE: WNL  HAND FUNCTION: Grip strength: Right: 64.3 lbs; Left: 79.8 lbs  COORDINATION: 9 Hole Peg test: Right: 57 sec; Left: 32 sec (arms abducted away from body and  tremor apparent on both sides) Box and Blocks:  Right 35 blocks, Left 35 blocks   SENSATION: Paresthesias reported in R finger tips  EDEMA: none reported or observed with exception to arthritic changes  MUSCLE TONE: WFL  COGNITION: Overall cognitive status: Within functional limits for tasks assessed  VISION: Subjective report: no changes/no difficulty Baseline vision: Wears glasses for reading only Visual history: cataracts and corrective eye surgery  PERCEPTION: WFL  PRAXIS: WFL  OBSERVATIONS: 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 Appears to be well-kept.  TODAY'S TREATMENT:  N/A this visit.  PATIENT EDUCATION: Education details: OT Role and POC Person educated: Patient Education method: Explanation Education comprehension: verbalized understanding  HOME EXERCISE PROGRAM: N/A this visit.  GOALS:  SHORT TERM GOALS: Target date: 02/20/2023  Patient will return demonstration with use of DME as needed for tremor reduction. Baseline: Goal status: INITIAL  2.  Pt will independently verbalize at least 2 tremor reduction strategies.  Baseline:  Goal status: INITIAL  LONG TERM GOALS: Target date: 03/08/2023  Patient will report at least two-point increase in average PSFS score or at least three-point increase in a single activity score indicating functionally significant improvement given minimum detectable change.  Baseline: 3.7 total score (See above for individual activity scores) Goal status: INITIAL  2.  Patient will complete nine-hole peg with at least 5 second improvement with RUE. Baseline: Right: 57 sec; Left: 32 sec Goal status: INITIAL  ASSESSMENT:  CLINICAL IMPRESSION: Patient is a 72 y.o. male who was seen today for  occupational therapy evaluation for tremor. Hx includes PMH of PAD s/p L SFA arthrectomy with angioplasty, left CEA, HTN, HLD, moderate AS, and neuropathy. Patient currently presents below baseline level of functioning demonstrating functional deficits and impairments as noted below. Pt would benefit from skilled OT services in the outpatient setting to provide pt with education on how to minimize the affect of his tremor on completion of daily activities.   PERFORMANCE DEFICITS: in functional skills including ADLs, IADLs, coordination, Fine motor control, and UE functional use.   IMPAIRMENTS: are limiting patient from ADLs, IADLs, and leisure.   CO-MORBIDITIES: may have co-morbidities  that affects occupational performance. Patient will benefit from skilled OT to address above impairments and improve overall function.  MODIFICATION OR ASSISTANCE TO COMPLETE EVALUATION: Min-Moderate modification of tasks or assist with assess necessary to complete an evaluation.  OT OCCUPATIONAL PROFILE AND HISTORY: Problem focused assessment: Including review of records relating to presenting problem.  CLINICAL DECISION MAKING: LOW - limited treatment options, no task modification necessary  REHAB POTENTIAL: Good  EVALUATION COMPLEXITY: Low    PLAN:  OT FREQUENCY: 1x/week  OT DURATION: 6 weeks (anticipate early d/c)  PLANNED INTERVENTIONS: self care/ADL training, therapeutic exercise, therapeutic activity, patient/family education, DME and/or AE instructions, and Re-evaluation  RECOMMENDED OTHER SERVICES: none at this time  CONSULTED AND AGREED WITH PLAN OF CARE: Patient  PLAN FOR NEXT SESSION: Tremor reduction strategies   Delana Meyer, OT 01/23/2023, 1:36 PM

## 2023-01-29 DIAGNOSIS — R531 Weakness: Secondary | ICD-10-CM | POA: Diagnosis not present

## 2023-01-29 DIAGNOSIS — R112 Nausea with vomiting, unspecified: Secondary | ICD-10-CM | POA: Diagnosis not present

## 2023-01-29 DIAGNOSIS — T887XXA Unspecified adverse effect of drug or medicament, initial encounter: Secondary | ICD-10-CM | POA: Diagnosis not present

## 2023-01-29 DIAGNOSIS — D509 Iron deficiency anemia, unspecified: Secondary | ICD-10-CM | POA: Diagnosis not present

## 2023-01-29 DIAGNOSIS — E119 Type 2 diabetes mellitus without complications: Secondary | ICD-10-CM | POA: Diagnosis not present

## 2023-01-30 ENCOUNTER — Ambulatory Visit: Payer: PPO | Admitting: Occupational Therapy

## 2023-01-30 ENCOUNTER — Ambulatory Visit: Payer: PPO | Attending: Neurology | Admitting: Physical Therapy

## 2023-01-30 VITALS — BP 103/53 | HR 76

## 2023-01-30 DIAGNOSIS — R2681 Unsteadiness on feet: Secondary | ICD-10-CM | POA: Insufficient documentation

## 2023-01-30 DIAGNOSIS — R202 Paresthesia of skin: Secondary | ICD-10-CM | POA: Diagnosis not present

## 2023-01-30 DIAGNOSIS — M6281 Muscle weakness (generalized): Secondary | ICD-10-CM

## 2023-01-30 DIAGNOSIS — R278 Other lack of coordination: Secondary | ICD-10-CM | POA: Insufficient documentation

## 2023-01-30 DIAGNOSIS — R251 Tremor, unspecified: Secondary | ICD-10-CM | POA: Diagnosis not present

## 2023-01-30 DIAGNOSIS — R2689 Other abnormalities of gait and mobility: Secondary | ICD-10-CM | POA: Diagnosis not present

## 2023-01-30 NOTE — Therapy (Unsigned)
OUTPATIENT OCCUPATIONAL THERAPY NEURO TREATMENT  Patient Name: Luis Lindsey MRN: [id number] DOB:[birth year], 72 y.o., male Today's Date: 01/30/2023  PCP: Irven Coe, MD  REFERRING PROVIDER: Levert Feinstein, MD  END OF SESSION:  OT End of Session - 01/30/23 1149     Visit Number 2    Number of Visits 7    Date for OT Re-Evaluation 03/08/23    Authorization Type [employer] - [place], no auth req.    Progress Note Due on Visit 7    OT Start Time 1148    OT Stop Time 1228    OT Time Calculation (min) 40 min    Activity Tolerance Patient tolerated treatment well    Behavior During Therapy WFL for tasks assessed/performed             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   Dysphagia    Esophageal dysmotility    GERD (gastroesophageal reflux disease)    Heart murmur    pt had recent echocardiogram   History of hiatal hernia    History of Holter monitoring 2009   History of kidney stones    HNP (herniated nucleus pulposus), lumbar    L4-5   HYPERCHOLESTEROLEMIA 11/30/2009   Hyperlipidemia    HYPERTENSION 11/30/2009   Kidney stones    Numbness and tingling    Peripheral vascular disease (HCC)    Right carotid bruit    SMOKER 11/30/2009   Stroke (HCC) 2012   URINARY CALCULUS 11/30/2009   Past Surgical History:  Procedure Laterality Date   ABDOMINAL AORTOGRAM W/LOWER EXTREMITY N/A 06/18/2018   Procedure: ABDOMINAL AORTOGRAM W/LOWER EXTREMITY;  Surgeon: Cephus Shelling, MD;  Location: [facility];  Service: Cardiovascular;  Laterality: N/A;   ABDOMINAL AORTOGRAM W/LOWER EXTREMITY N/A 12/02/2019   Procedure: ABDOMINAL AORTOGRAM W/LOWER EXTREMITY;  Surgeon: Cephus Shelling, MD;  Location: [facility];  Service: Cardiovascular;  Laterality: N/A;   ABDOMINAL  AORTOGRAM W/LOWER EXTREMITY N/A 03/23/2021   Procedure: ABDOMINAL AORTOGRAM W/LOWER EXTREMITY;  Surgeon: Cephus Shelling, MD;  Location: [facility];  Service: Cardiovascular;  Laterality: N/A;   AORTIC VALVE REPLACEMENT N/A 10/25/2021   Procedure: AORTIC VALVE REPLACEMENT (AVR)USING INSPIRIS RESILIA  AORTIC VALVE;  Surgeon: Corliss Skains, MD;  Location: [facility];  Service: Open Heart Surgery;  Laterality: N/A;   arthroscopy of right shoulder Right    Jan 2019, June 2019-- done at [facility], Dr. Magnus Ivan   BACK SURGERY     CATARACT EXTRACTION W/ INTRAOCULAR LENS  IMPLANT, BILATERAL     CORONARY ARTERY BYPASS GRAFT N/A 10/25/2021   Procedure: CORONARY ARTERY BYPASS GRAFTING (CABG) X 3 USING LEFT INTERNAL MAMMARY ARTERY AND RIGHT GREATER SAPHENOUS VEIN;  Surgeon: Corliss Skains, MD;  Location: [facility];  Service: Open Heart Surgery;  Laterality: N/A;   CYSTOSCOPY     ENDARTERECTOMY Right 08/24/2019   Procedure: ENDARTERECTOMY CAROTID;  Surgeon: Cephus Shelling, MD;  Location: [facility] OR;  Service: Vascular;  Laterality: Right;   ENDARTERECTOMY FEMORAL Left 06/05/2021   Procedure: ENDARTERECTOMY FEMORAL WITH PROFUNDAPLASTY;  Surgeon: Cephus Shelling, MD;  Location: [facility] OR;  Service: Vascular;  Laterality: Left;   ENDOVEIN HARVEST OF GREATER  SAPHENOUS VEIN Right 10/25/2021   Procedure: ENDOVEIN HARVEST OF GREATER SAPHENOUS VEIN;  Surgeon: Corliss Skains, MD;  Location: [facility];  Service: Open Heart Surgery;  Laterality: Right;   FACIAL COSMETIC SURGERY  2014   FEMORAL-POPLITEAL BYPASS GRAFT Left 06/05/2021   Procedure: LEFT FEMORAL-POPLITEAL ARTERY BYPASS GRAFTING USING 6mm PROPATEN VASCULAR REMOVABLE RING GRAFT;  Surgeon: Cephus Shelling, MD;  Location: [facility];  Service: Vascular;  Laterality: Left;   FINGER SURGERY  2002   LEFT HEART CATH AND CORONARY ANGIOGRAPHY N/A 10/05/2021   Procedure: LEFT HEART CATH AND CORONARY ANGIOGRAPHY;  Surgeon: Yvonne Kendall, MD;  [facility]: [facility];  Service: Cardiovascular;  Laterality: N/A;   LUMBAR LAMINECTOMY/DECOMPRESSION MICRODISCECTOMY Left 08/01/2018   Procedure: Microdiscectomy - Lumbar four-Lumbar five - left;  Surgeon: Tia Alert, MD;  Location: [facility] OR;  Service: Neurosurgery;  Laterality: Left;   LUMBAR LAMINECTOMY/DECOMPRESSION MICRODISCECTOMY Left 10/15/2018   Procedure: Re-do Microdiscectomy - left - Lumbar four-Lumbar five;  Surgeon: Tia Alert, MD;  Location: [facility] OR;  Service: Neurosurgery;  Laterality: Left;   NECK SURGERY  1986   PATCH ANGIOPLASTY Left 06/05/2021   Procedure: PATCH ANGIOPLASTY USING Livia Snellen BIOLOGIC PATCH;  Surgeon: Cephus Shelling, MD;  Location: [facility] OR;  Service: Vascular;  Laterality: Left;   PERIPHERAL VASCULAR ATHERECTOMY  12/02/2019   Procedure: PERIPHERAL VASCULAR ATHERECTOMY;  Surgeon: Cephus Shelling, MD;  [facility]: [facility];  Service: Cardiovascular;;  left SFA   PERIPHERAL VASCULAR INTERVENTION  06/18/2018   Procedure: PERIPHERAL VASCULAR INTERVENTION;  Surgeon: Cephus Shelling, MD;  [facility]: [facility];  Service: Cardiovascular;;  Left external iliac   ROTATOR CUFF REPAIR Left    TEE WITHOUT CARDIOVERSION N/A 10/25/2021   Procedure: TRANSESOPHAGEAL ECHOCARDIOGRAM (TEE);  Surgeon: Corliss Skains, MD;  Location: [facility] OR;  Service: Open Heart Surgery;  Laterality: N/A;   UMBILICAL HERNIA REPAIR     Patient Active Problem List   Diagnosis Date Noted   Essential tremor 12/26/2022   Paresthesia 12/26/2022   Hypokalemia 03/01/2022   Hypoalbuminemia 03/01/2022   Abnormal LFTs 03/01/2022   Symptomatic anemia 02/27/2022   S/P CABG x 3 02/27/2022   Iron deficiency anemia 02/27/2022   Complication of surgical procedure 02/13/2022   Degeneration of lumbar intervertebral disc 02/13/2022   S/P AVR (aortic valve replacement) 10/25/2021   Coronary artery disease of native artery of native heart with stable angina  pectoris (HCC)    Abnormal cardiac CT angiography    PAD (peripheral artery disease) (HCC) 06/05/2021   S/P lumbar fusion 12/07/2020   Peripheral vascular disease (HCC) 04/06/2020   Cerebrovascular accident (CVA) (HCC) 04/06/2020   S/P carotid endarterectomy 04/06/2020   DM type 2 with diabetic peripheral neuropathy (HCC) 12/31/2019   Gait abnormality 12/31/2019   Tremor 12/07/2019   Carotid stenosis, asymptomatic, right 08/24/2019   Carotid disease, bilateral (HCC) 08/11/2019   S/P lumbar laminectomy 08/01/2018   PVD (peripheral vascular disease) (HCC) 06/10/2018   Left lumbar radiculopathy 04/01/2018   Peripheral neuropathy 04/01/2018   Left leg claudication (HCC) 04/01/2018   Status post arthroscopy of right shoulder 06/27/2017   Superior glenoid labrum lesion of right shoulder 06/10/2017   Chronic right shoulder pain 04/16/2017   Displaced fracture of proximal phalanx of right great toe with routine healing 03/13/2017   Impingement syndrome of right shoulder 03/13/2017   Displaced fracture of proximal phalanx of right great toe, initial encounter for closed fracture 02/20/2017   Diabetes (HCC)  08/24/2013   History of Holter monitoring    Heart murmur    TIA (transient ischemic attack) 12/26/2012   Kidney stones    DM 11/30/2009   HYPERCHOLESTEROLEMIA 11/30/2009   Essential hypertension 11/30/2009   URINARY CALCULUS 11/30/2009   CAROTID BRUIT, RIGHT 11/30/2009    ONSET DATE: 01/01/2023 (date of referral) - noticed tremor in both hands a couple years ago  REFERRING DIAG: M62.81 (ICD-10-CM) - Muscle weakness (generalized) R27.8 (ICD-10-CM) - Other lack of coordination R25.1 (ICD-10-CM) - Tremor  THERAPY DIAG:  Other lack of coordination  Tremor  Muscle weakness (generalized)  Rationale for Evaluation and Treatment: Rehabilitation  SUBJECTIVE:   SUBJECTIVE STATEMENT: He mostly has issues with eating and writing with respect to his tremors.  Pt accompanied by:  self  PERTINENT HISTORY: PMH of PAD s/p L SFA arthrectomy with angioplasty, left CEA, HTN, HLD, moderate AS   Neuropathy: burning tingling bilateral feet (ongoing 3 yrs), worsening tremor" "shaking in hands and legs (ongoing 2 years)  PRECAUTIONS: Fall and Other: hernia-avoid heaving lifting   WEIGHT BEARING RESTRICTIONS: No  PAIN:  Are you having pain? No  FALLS: Has patient fallen in last 6 months? No  LIVING ENVIRONMENT: Lives with: lives with their spouse [name] Lives in: House/apartment Stairs: Yes: External: 1 steps; none Has following equipment at home: Environmental consultant, cane  PLOF: Independent; retired [employer] architect; driver  PATIENT GOALS: Reduce affects of tremor on daily tasks  OBJECTIVE:   HAND DOMINANCE: Right  ADLs: Overall ADLs: mod I Eating: uses spoon and bowl mostly UB Dressing: avoids buttons LB Dressing: uses slip on shoes  Equipment: Shower seat with back and Grab bars  IADLs: Shopping: mod I Light housekeeping: mod I Meal Prep: mod I  Community mobility: driving Medication management: mod I Financial management: I Handwriting:  Pt reports his handwriting is about 30% of what it was; writes in cursive  MOBILITY STATUS:  occasional bouts of LOB requiring SBA; no AD  ACTIVITY TOLERANCE: Activity tolerance: good to fair  FUNCTIONAL OUTCOME MEASURES: PSFS: 3.7  Total score = sum of the activity scores/number of activities Minimum detectable change (90%CI) for average score = 2 points Minimum detectable change (90%CI) for single activity score = 3 points   UPPER EXTREMITY ROM:    BUE: WNL  UPPER EXTREMITY MMT:     BUE: WNL  HAND FUNCTION: Grip strength: Right: 64.3 lbs; Left: 79.8 lbs  COORDINATION: 9 Hole Peg test: Right: 57 sec; Left: 32 sec (arms abducted away from body and tremor apparent on both sides) Box and Blocks:  Right 35 blocks, Left 35 blocks   SENSATION: Paresthesias reported in R finger tips  EDEMA: none reported or  observed with exception to arthritic changes  MUSCLE TONE: WFL  COGNITION: Overall cognitive status: Within functional limits for tasks assessed  VISION: Subjective report: no changes/no difficulty Baseline vision: Wears glasses for reading only Visual history: cataracts and corrective eye surgery  PERCEPTION: WFL  PRAXIS: WFL  OBSERVATIONS: 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 Appears to be well-kept.  TODAY'S TREATMENT:  OT educated pt on use of DME and tremor reduction strategies as noted in patient instructions in efforts to reduce the effects of tremor on completion of functional activities.  Handout provided.  Recommendations included:   Weighted utensils -patient demonstrates use, information provided on how to obtain  Wrist weights-patient demonstrates use reporting preference for 2 pound weight, information provided on how to obtain  Twist 'N Write pencil and Pen Again pen -patient demonstrates use, information provided on how to obtain  Weightbearing through upper extremities -patient demonstrating understanding on how to generalize into functional activities  PATIENT EDUCATION: Education details: Tremor reduction strategies Person educated: Patient Education method: Explanation, Demonstration, and Handouts Education comprehension: verbalized understanding, returned demonstration, and needs further education  HOME EXERCISE PROGRAM: 01/30/2023: tremor reduction strategies  GOALS:  SHORT TERM GOALS: Target date: 02/20/2023  Patient will return demonstration with use of DME as needed for tremor reduction. Baseline: Goal status: INITIAL  2.  Pt will independently verbalize at least 2 tremor reduction strategies.  Baseline:  Goal status:  INITIAL  LONG TERM GOALS: Target date: 03/08/2023  Patient will report at least two-point increase in average PSFS score or at least three-point increase in a single activity score indicating functionally significant improvement given minimum detectable change.  Baseline: 3.7 total score (See above for individual activity scores) Goal status: INITIAL  2.  Patient will complete nine-hole peg with at least 5 second improvement with RUE. Baseline: Right: 57 sec; Left: 32 sec Goal status: INITIAL  ASSESSMENT:  CLINICAL IMPRESSION: Good understanding of tremor reduction strategies as needed to improve accuracy and safety with ADL and IADL completion.   PERFORMANCE DEFICITS: in functional skills including ADLs, IADLs, coordination, Fine motor control, and UE functional use.   IMPAIRMENTS: are limiting patient from ADLs, IADLs, and leisure.   CO-MORBIDITIES: may have co-morbidities  that affects occupational performance. Patient will benefit from skilled OT to address above impairments and improve overall function.  REHAB POTENTIAL: Good  PLAN:  OT FREQUENCY: 1x/week  OT DURATION: 6 weeks (anticipate early d/c)  PLANNED INTERVENTIONS: self care/ADL training, therapeutic exercise, therapeutic activity, patient/family education, DME and/or AE instructions, and Re-evaluation  RECOMMENDED OTHER SERVICES: none at this time  CONSULTED AND AGREED WITH PLAN OF CARE: Patient  PLAN FOR NEXT SESSION: Review tremor reduction strategies; BUE strengthening   Delana Meyer, OT 01/30/2023, 5:11 PM

## 2023-01-30 NOTE — Patient Instructions (Addendum)
   Compensation Strategies for Tremors  When eating, try the following: . Eat out of bowls, divided plates, or use a plate guard (available at a medical supply store) and eat with a spoon so that you have an edge to scoop up food. . Try raising your plate so that there is less distance between the plate and mouth. . Try stabilizing elbows on the table or against your body. . Use utensil with built-up/larger grips as they are easier to hold.  When writing, try the following:   . Stabilize forearm on the table . Take your time as rushing/being stressed can increase tremors. . Try a felt-tipped pen, it does not glide as much.  Avoid gel pens (they move too much). . Consider using pre-printed labels with your name and address (carry them with you when you go out) or you can get stamps with your address or signature on it. . Use a small tape recorder to record messages/reminders for yourself. . Use pens with bigger grips.  When brushing your teeth, putting on make-up, or styling hair, try the following: . Use an electric toothbrush. . Use items with built-up grips. . Stabilize your elbows against your body or on the counter. . Use long-handled brushes/combs. . Use a hair dryer with a stand.  In general: . Avoid stress, fatigue, or rushing as this can increase tremors. . Sit down for activities that require more control/coordination.

## 2023-01-30 NOTE — Therapy (Signed)
OUTPATIENT PHYSICAL THERAPY NEURO TREATMENT   Patient Name: Luis Lindsey MRN: [id number] DOB:03-May-1951, 72 y.o., male Today's Date: 01/30/2023   PCP: Irven Coe, MD REFERRING PROVIDER: Levert Feinstein, MD  END OF SESSION:  PT End of Session - 01/30/23 1103     Visit Number 5    Number of Visits 7   with eval   Date for PT Re-Evaluation 03/25/23   to allow for scheduling delays   Authorization Type [employer]    PT Start Time 1100    PT Stop Time 1145    PT Time Calculation (min) 45 min    Equipment Utilized During Treatment Gait belt    Activity Tolerance Patient tolerated treatment well    Behavior During Therapy WFL for tasks assessed/performed               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   Dysphagia    Esophageal dysmotility    GERD (gastroesophageal reflux disease)    Heart murmur    pt had recent echocardiogram   History of hiatal hernia    History of Holter monitoring 2009   History of kidney stones    HNP (herniated nucleus pulposus), lumbar    L4-5   HYPERCHOLESTEROLEMIA 11/30/2009   Hyperlipidemia    HYPERTENSION 11/30/2009   Kidney stones    Numbness and tingling    Peripheral vascular disease (HCC)    Right carotid bruit    SMOKER 11/30/2009   Stroke (HCC) 2012   URINARY CALCULUS 11/30/2009   Past Surgical History:  Procedure Laterality Date   ABDOMINAL AORTOGRAM W/LOWER EXTREMITY N/A 06/18/2018   Procedure: ABDOMINAL AORTOGRAM W/LOWER EXTREMITY;  Surgeon: Cephus Shelling, MD;  [facility]: [facility];  Service: Cardiovascular;  Laterality: N/A;   ABDOMINAL AORTOGRAM W/LOWER EXTREMITY N/A 12/02/2019   Procedure: ABDOMINAL AORTOGRAM W/LOWER EXTREMITY;  Surgeon: Cephus Shelling, MD;  Location: [facility];  Service:  Cardiovascular;  Laterality: N/A;   ABDOMINAL AORTOGRAM W/LOWER EXTREMITY N/A 03/23/2021   Procedure: ABDOMINAL AORTOGRAM W/LOWER EXTREMITY;  Surgeon: Cephus Shelling, MD;  Location: [facility];  Service: Cardiovascular;  Laterality: N/A;   AORTIC VALVE REPLACEMENT N/A 10/25/2021   Procedure: AORTIC VALVE REPLACEMENT (AVR)USING INSPIRIS RESILIA  AORTIC VALVE;  Surgeon: Corliss Skains, MD;  Location: [facility];  Service: Open Heart Surgery;  Laterality: N/A;   arthroscopy of right shoulder Right    Jan 2019, June 2019-- done at [facility], Dr. Magnus Ivan   BACK SURGERY     CATARACT EXTRACTION W/ INTRAOCULAR LENS  IMPLANT, BILATERAL     CORONARY ARTERY BYPASS GRAFT N/A 10/25/2021   Procedure: CORONARY ARTERY BYPASS GRAFTING (CABG) X 3 USING LEFT INTERNAL MAMMARY ARTERY AND RIGHT GREATER SAPHENOUS VEIN;  Surgeon: Corliss Skains, MD;  Location: [facility];  Service: Open Heart Surgery;  Laterality: N/A;   CYSTOSCOPY     ENDARTERECTOMY Right 08/24/2019   Procedure: ENDARTERECTOMY CAROTID;  Surgeon: Cephus Shelling, MD;  Location: [facility] OR;  Service: Vascular;  Laterality: Right;   ENDARTERECTOMY FEMORAL Left 06/05/2021   Procedure: ENDARTERECTOMY FEMORAL WITH PROFUNDAPLASTY;  Surgeon: Cephus Shelling, MD;  Location: [facility] OR;  Service: Vascular;  Laterality: Left;  ENDOVEIN HARVEST OF GREATER SAPHENOUS VEIN Right 10/25/2021   Procedure: ENDOVEIN HARVEST OF GREATER SAPHENOUS VEIN;  Surgeon: Corliss Skains, MD;  [facility]: [facility];  Service: Open Heart Surgery;  Laterality: Right;   FACIAL COSMETIC SURGERY  2014   FEMORAL-POPLITEAL BYPASS GRAFT Left 06/05/2021   Procedure: LEFT FEMORAL-POPLITEAL ARTERY BYPASS GRAFTING USING 6mm PROPATEN VASCULAR REMOVABLE RING GRAFT;  Surgeon: Cephus Shelling, MD;  Location: [facility];  Service: Vascular;  Laterality: Left;   FINGER SURGERY  2002   LEFT HEART CATH AND CORONARY ANGIOGRAPHY N/A 10/05/2021   Procedure: LEFT HEART  CATH AND CORONARY ANGIOGRAPHY;  Surgeon: Yvonne Kendall, MD;  [facility];  Service: Cardiovascular;  Laterality: N/A;   LUMBAR LAMINECTOMY/DECOMPRESSION MICRODISCECTOMY Left 08/01/2018   Procedure: Microdiscectomy - Lumbar four-Lumbar five - left;  Surgeon: Tia Alert, MD;  Location[facility] OR;  Service: Neurosurgery;  Laterality: Left;   LUMBAR LAMINECTOMY/DECOMPRESSION MICRODISCECTOMY Left 10/15/2018   Procedure: Re-do Microdiscectomy - left - Lumbar four-Lumbar five;  Surgeon: Tia Alert, MD;  Location: [facility] OR;  Service: Neurosurgery;  Laterality: Left;   NECK SURGERY  1986   PATCH ANGIOPLASTY Left 06/05/2021   Procedure: PATCH ANGIOPLASTY USING Livia Snellen BIOLOGIC PATCH;  Surgeon: Cephus Shelling, MD;  Location: [facility] OR;  Service: Vascular;  Laterality: Left;   PERIPHERAL VASCULAR ATHERECTOMY  12/02/2019   Procedure: PERIPHERAL VASCULAR ATHERECTOMY;  Surgeon: Cephus Shelling, MD;  [facility];  Service: Cardiovascular;;  left SFA   PERIPHERAL VASCULAR INTERVENTION  06/18/2018   Procedure: PERIPHERAL VASCULAR INTERVENTION;  Surgeon: Cephus Shelling, MD;  [facility];  Service: Cardiovascular;;  Left external iliac   ROTATOR CUFF REPAIR Left    TEE WITHOUT CARDIOVERSION N/A 10/25/2021   Procedure: TRANSESOPHAGEAL ECHOCARDIOGRAM (TEE);  Surgeon: Corliss Skains, MD;  Location: [facility] OR;  Service: Open Heart Surgery;  Laterality: N/A;   UMBILICAL HERNIA REPAIR     Patient Active Problem List   Diagnosis Date Noted   Essential tremor 12/26/2022   Paresthesia 12/26/2022   Hypokalemia 03/01/2022   Hypoalbuminemia 03/01/2022   Abnormal LFTs 03/01/2022   Symptomatic anemia 02/27/2022   S/P CABG x 3 02/27/2022   Iron deficiency anemia 02/27/2022   Complication of surgical procedure 02/13/2022   Degeneration of lumbar intervertebral disc 02/13/2022   S/P AVR (aortic valve replacement) 10/25/2021   Coronary artery disease of native  artery of native heart with stable angina pectoris (HCC)    Abnormal cardiac CT angiography    PAD (peripheral artery disease) (HCC) 06/05/2021   S/P lumbar fusion 12/07/2020   Peripheral vascular disease (HCC) 04/06/2020   Cerebrovascular accident (CVA) (HCC) 04/06/2020   S/P carotid endarterectomy 04/06/2020   DM type 2 with diabetic peripheral neuropathy (HCC) 12/31/2019   Gait abnormality 12/31/2019   Tremor 12/07/2019   Carotid stenosis, asymptomatic, right 08/24/2019   Carotid disease, bilateral (HCC) 08/11/2019   S/P lumbar laminectomy 08/01/2018   PVD (peripheral vascular disease) (HCC) 06/10/2018   Left lumbar radiculopathy 04/01/2018   Peripheral neuropathy 04/01/2018   Left leg claudication (HCC) 04/01/2018   Status post arthroscopy of right shoulder 06/27/2017   Superior glenoid labrum lesion of right shoulder 06/10/2017   Chronic right shoulder pain 04/16/2017   Displaced fracture of proximal phalanx of right great toe with routine healing 03/13/2017   Impingement syndrome of right shoulder 03/13/2017   Displaced fracture of proximal phalanx of right great toe, initial encounter for closed fracture 02/20/2017  Diabetes (HCC) 08/24/2013   History of Holter monitoring    Heart murmur    TIA (transient ischemic attack) 12/26/2012   Kidney stones    DM 11/30/2009   HYPERCHOLESTEROLEMIA 11/30/2009   Essential hypertension 11/30/2009   URINARY CALCULUS 11/30/2009   CAROTID BRUIT, RIGHT 11/30/2009    ONSET DATE: 12/26/2022 (referral date)  REFERRING DIAG: G25.0 (ICD-10-CM) - Essential tremor R20.2 (ICD-10-CM) - Paresthesia  THERAPY DIAG:  Other lack of coordination  Tremor  Muscle weakness (generalized)  Other abnormalities of gait and mobility  Unsteadiness on feet  Paresthesia of skin  Rationale for Evaluation and Treatment: Rehabilitation  SUBJECTIVE:                                                                                                                                                                                              SUBJECTIVE STATEMENT: Pt reports he feels like he is doing a little better in terms of his symptoms; doesn't stumble or trip when he tries to turn. No falls. Pt reports the impaired nerve sensation in his feet remains about the same. Pt reports he did feel like the TENS was helpful, interested in getting his own unit.  Pt accompanied by: self  PERTINENT HISTORY:  PMH of PAD s/p L SFA arthrectomy with angioplasty, left CEA, HTN, HLD, moderate AS  Neuropathy: burning tingling bilateral feet (ongoing 3 yrs), worsening tremor" "shaking in hands and legs (ongoing 2 years)  PAIN:  Are you having pain? Yes: NPRS scale: 2-3/10 Pain location: hernia Pain description: aching Aggravating factors: N/A Relieving factors: N/A  Vitals:   01/30/23 1111  BP: (!) 103/53  Pulse: 76        PRECAUTIONS: Fall and Other: hernia-avoid heaving lifting  RED FLAGS: Bowel or bladder incontinence: No   WEIGHT BEARING RESTRICTIONS: No  FALLS: Has patient fallen in last 6 months? No  LIVING ENVIRONMENT: Lives with: lives with their spouse [name] Lives in: House/apartment Stairs: Yes: External: 1 steps; none Has following equipment at home: None  PLOF: Independent with gait and Independent with transfers  PATIENT GOALS: "I'm not sure"  OBJECTIVE:   DIAGNOSTIC FINDINGS:  Nerve conduction study and EMG scheduled at [facility] 02/22/23  COGNITION: Overall cognitive status: Within functional limits for tasks assessed   SENSATION: Impaired in BLE (distal RLE, knee down in LLE)  COORDINATION: WFL in BUE and BLE   LOWER EXTREMITY MMT:    MMT Right Eval Left Eval  Hip flexion 5 3  Hip extension    Hip abduction    Hip adduction    Hip internal rotation    Hip  external rotation    Knee flexion 5 3  Knee extension 5 3  Ankle dorsiflexion 4 3  Ankle plantarflexion    Ankle inversion    Ankle eversion     (Blank rows = not tested)  BED MOBILITY:  Mod I per pt report  TRANSFERS: Assistive device utilized: None  Sit to stand: Complete Independence Stand to sit: Complete Independence Chair to chair: Complete Independence Floor:  not assessed at initial eval  STAIRS: Level of Assistance: SBA Stair Negotiation Technique: Alternating Pattern  with Single Rail on Right Number of Stairs: 6  Height of Stairs: 4  Comments: WFL  GAIT: Gait pattern: decreased hip/knee flexion- Left and decreased ankle dorsiflexion- Left Distance walked: various clinic distances Assistive device utilized: None Level of assistance: Modified independence Comments: to be further assessed in functional context   TODAY'S TREATMENT:                                                                                                                              TherAct L BP in sitting at onset of session. No complaints of dizziness or lightheadedness during session: Vitals:   01/30/23 1111  BP: (!) 103/53  Pulse: 76    Reviewed current HEP in order to revise/upgrade: Tandem gait at countertop with no UE support 4 x 10 ft, some difficulty initially but improves with progression Standing marches (easy) Tandem stance in corner x 30 sec each B (difficult, R>L) Romberg stance EC on pillow Added in vertical and horizontal head turns (mild difficulty with vertical head turns)  Resisted gait at countertop: Sidesteps L/R 3 x 10 ft with green theraband Monster walks forwards/backwards 3 x 10 ft each with green theraband Feels pain at site of his hernia, deferred this exercise  Heel/toe raises x 10 reps each, difficulty performing toe raises  At ballet bar with progression to no UE support: Rockerboard A/P balance x 30 sec Mini-squats x 10 reps Rockerboard M/L balance x 30s ec Mini-squats x 10 reps  Revised HEP with removal of easy exercises and addition of exercises practiced this session, see bolded  below  PATIENT EDUCATION: Education details: revised/upgraded HEP, where to purchase TENS and that therapist can assist with setting up his machine if he purchases one and brings it to therapy Person educated: Patient Education method: Explanation, Demonstration, Verbal cues, and Handouts Education comprehension: verbalized understanding and needs further education  HOME EXERCISE PROGRAM: Access Code: [email or web address] URL: [email or web address] Date: 01/09/2023 Prepared by: Sherlie Ban  Exercises - Tandem Walking with Counter Support  - 1-2 x daily - 5 x weekly - 3 sets - Tandem Stance  - 1-2 x daily - 5 x weekly - 3 sets - 20-30 hold  - Side Stepping with Resistance at Ankles and Counter Support  - 1 x daily - 7 x weekly - 3 sets - 10 reps - Heel Toe Raises with Counter Support  - 1 x daily - 7 x  weekly - 3 sets - 10 reps   GOALS: Goals reviewed with patient? Yes  SHORT TERM GOALS: Target date: 01/21/2023  Pt will be independent with initial HEP for improved strength, balance, transfers and gait. Baseline:  IND (8/28) Goal status: MET  2.  Pt will improve FGA to 21/30 for decreased fall risk  Baseline: 17/30 (8/5); 22/30 (8/28) Goal status: MET   LONG TERM GOALS: Target date: 02/11/2023   Pt will be independent with final HEP for improved strength, balance, transfers and gait. Baseline:  Goal status: INITIAL  2.  Pt will improve 5 x STS to less than or equal to 15 seconds to demonstrate improved functional strength and transfer efficiency.  Baseline: 18.31 sec with BUE support (8/5) Goal status: INITIAL  3.  Pt will improve FGA to 25/30 for decreased fall risk  Baseline: 17/30 (8/5) Goal status: INITIAL   ASSESSMENT:  CLINICAL IMPRESSION: Emphasis of skilled PT session on reviewing pt's current HEP and upgrading it/revisiting it based on challenge level. Also worked on trialing several other exercises to determine what would be appropriate to add to his  HEP. Pt does have onset of pain at site of his hernia with resisted monster walks so deferred further attempts at this exercise and did not add to HEP. Pt continues to benefit from skilled therapy services to work on improving his balance and working on decreasing his neuropathic pain. Continue POC.    OBJECTIVE IMPAIRMENTS: Abnormal gait, decreased balance, decreased knowledge of condition, decreased strength, impaired sensation, impaired UE functional use, and pain.   ACTIVITY LIMITATIONS: carrying, lifting, and stairs  PARTICIPATION LIMITATIONS: meal prep, cleaning, driving, and community activity  PERSONAL FACTORS: Age, Time since onset of injury/illness/exacerbation, and 1-2 comorbidities:    PAD s/p L SFA arthrectomy with angioplasty, left CEA, HTN, HLD, moderate ASare also affecting patient's functional outcome.   REHAB POTENTIAL: Fair time since onset, diabetic peripheral neuropathy and/or history of lumbar radiculopathy s/p lumbar decompression  CLINICAL DECISION MAKING: Stable/uncomplicated  EVALUATION COMPLEXITY: Low  PLAN:  PT FREQUENCY: 1x/week  PT DURATION: 6 weeks  PLANNED INTERVENTIONS: Therapeutic exercises, Therapeutic activity, Neuromuscular re-education, Balance training, Gait training, Patient/Family education, Self Care, Joint mobilization, Orthotic/Fit training, DME instructions, Aquatic Therapy, Dry Needling, Electrical stimulation, Cryotherapy, Moist heat, Taping, Manual therapy, and Re-evaluation  PLAN FOR NEXT SESSION: continue to UPDATE HEP!  Has hernia surgery this Friday (9/6)--pt educated that he will need resume PT orders and any restrictions he has from his doctor, work on balance (turns, obstacles, tandem, EC, SLS), TENS for neuropathy, Is he interested in aquatic therapy (potentially), rockerboard squats + ball toss + EC or head turns     Peter Congo, PT, DPT, CSRS   01/30/23 11:47 AM

## 2023-01-31 NOTE — Anesthesia Preprocedure Evaluation (Addendum)
Anesthesia Evaluation  Patient identified by MRN, date of birth, ID band Patient awake    Reviewed: Allergy & Precautions, NPO status , Patient's Chart, lab work & pertinent test results  Airway Mallampati: II  TM Distance: >3 FB Neck ROM: Full    Dental no notable dental hx. (+) Edentulous Upper, Edentulous Lower   Pulmonary former smoker   Pulmonary exam normal breath sounds clear to auscultation       Cardiovascular hypertension, + CABG (S/P CABG AVR 2023) and + Peripheral Vascular Disease  Normal cardiovascular exam Rhythm:Regular Rate:Normal  Echo 11/28/2022 1. Left ventricular ejection fraction, by estimation, is 50 to 55%. The  left ventricle has low normal function. The left ventricle has no regional  wall motion abnormalities. Left ventricular diastolic parameters are  indeterminate.   2. Right ventricular systolic function is normal. The right ventricular  size is normal. There is normal pulmonary artery systolic pressure.   3. Left atrial size was mildly dilated.   4. The mitral valve is normal in structure. Trivial mitral valve  regurgitation.   5. S/p 23 mm Inspiris Resilia. No PVL. AV max 2.1 m/s. Mean gradient 10  mmHg. Aortic valve regurgitation is not visualized. Echo findings are  consistent with normal structure and function of the aortic valve  prosthesis.   6. The inferior vena cava is normal in size with greater than 50%  respiratory variability, suggesting right atrial pressure of 3 mmHg    Neuro/Psych    GI/Hepatic hiatal hernia,GERD  ,,  Endo/Other  diabetes, Type 2, Oral Hypoglycemic Agents  On semiglutide IV  Renal/GU Renal diseaseLab Results      Component                Value               Date                            K                        3.7                 01/17/2023                   CREATININE               1.26 (H)            01/17/2023                   Musculoskeletal  (+)  Arthritis ,    Abdominal   Peds  Hematology On Plavix Lab Results      Component                Value               Date                      WBC                      3.4 (L)             01/17/2023                HGB  12.0 (L)            01/17/2023                HCT                      37.3 (L)            01/17/2023                MCV                      91.4                01/17/2023                PLT                      207                 01/17/2023              Anesthesia Other Findings [id number]:[id number]  Reproductive/Obstetrics                             Anesthesia Physical Anesthesia Plan  ASA: 3  Anesthesia Plan: General   Post-op Pain Management: Precedex   Induction: Intravenous  PONV Risk Score and Plan: Treatment may vary due to age or medical condition, Ondansetron and Dexamethasone  Airway Management Planned: Oral ETT  Additional Equipment: None  Intra-op Plan:   Post-operative Plan: Extubation in OR  Informed Consent: I have reviewed the patients History and Physical, chart, labs and discussed the procedure including the risks, benefits and alternatives for the proposed anesthesia with the patient or authorized representative who has indicated his/her understanding and acceptance.     Dental advisory given  Plan Discussed with: CRNA and Anesthesiologist  Anesthesia Plan Comments:         Anesthesia Quick Evaluation

## 2023-02-01 ENCOUNTER — Ambulatory Visit (HOSPITAL_COMMUNITY): Payer: PPO | Admitting: Physician Assistant

## 2023-02-01 ENCOUNTER — Ambulatory Visit (HOSPITAL_COMMUNITY)
Admission: RE | Admit: 2023-02-01 | Discharge: 2023-02-01 | Disposition: A | Discharge status: Home / Self Care | Payer: PPO | Source: Ambulatory Visit | Attending: Surgery | Admitting: Surgery

## 2023-02-01 ENCOUNTER — Other Ambulatory Visit: Payer: Self-pay

## 2023-02-01 ENCOUNTER — Encounter (HOSPITAL_COMMUNITY)
Admission: RE | Disposition: A | Discharge status: Home / Self Care | Payer: Self-pay | Source: Ambulatory Visit | Attending: Surgery

## 2023-02-01 ENCOUNTER — Encounter (HOSPITAL_COMMUNITY): Payer: Self-pay | Admitting: Surgery

## 2023-02-01 ENCOUNTER — Ambulatory Visit (HOSPITAL_BASED_OUTPATIENT_CLINIC_OR_DEPARTMENT_OTHER): Payer: PPO | Admitting: Physician Assistant

## 2023-02-01 DIAGNOSIS — Z7902 Long term (current) use of antithrombotics/antiplatelets: Secondary | ICD-10-CM | POA: Diagnosis not present

## 2023-02-01 DIAGNOSIS — Z87442 Personal history of urinary calculi: Secondary | ICD-10-CM | POA: Insufficient documentation

## 2023-02-01 DIAGNOSIS — N400 Enlarged prostate without lower urinary tract symptoms: Secondary | ICD-10-CM | POA: Insufficient documentation

## 2023-02-01 DIAGNOSIS — K219 Gastro-esophageal reflux disease without esophagitis: Secondary | ICD-10-CM | POA: Insufficient documentation

## 2023-02-01 DIAGNOSIS — I10 Essential (primary) hypertension: Secondary | ICD-10-CM | POA: Insufficient documentation

## 2023-02-01 DIAGNOSIS — Z01818 Encounter for other preprocedural examination: Secondary | ICD-10-CM

## 2023-02-01 DIAGNOSIS — Z87891 Personal history of nicotine dependence: Secondary | ICD-10-CM | POA: Insufficient documentation

## 2023-02-01 DIAGNOSIS — Z7984 Long term (current) use of oral hypoglycemic drugs: Secondary | ICD-10-CM | POA: Diagnosis not present

## 2023-02-01 DIAGNOSIS — Z952 Presence of prosthetic heart valve: Secondary | ICD-10-CM | POA: Insufficient documentation

## 2023-02-01 DIAGNOSIS — K4090 Unilateral inguinal hernia, without obstruction or gangrene, not specified as recurrent: Secondary | ICD-10-CM | POA: Insufficient documentation

## 2023-02-01 DIAGNOSIS — E119 Type 2 diabetes mellitus without complications: Secondary | ICD-10-CM | POA: Diagnosis not present

## 2023-02-01 DIAGNOSIS — I25118 Atherosclerotic heart disease of native coronary artery with other forms of angina pectoris: Secondary | ICD-10-CM | POA: Diagnosis not present

## 2023-02-01 DIAGNOSIS — Z951 Presence of aortocoronary bypass graft: Secondary | ICD-10-CM | POA: Diagnosis not present

## 2023-02-01 DIAGNOSIS — Z85828 Personal history of other malignant neoplasm of skin: Secondary | ICD-10-CM | POA: Insufficient documentation

## 2023-02-01 DIAGNOSIS — E1151 Type 2 diabetes mellitus with diabetic peripheral angiopathy without gangrene: Secondary | ICD-10-CM | POA: Insufficient documentation

## 2023-02-01 HISTORY — PX: INGUINAL HERNIA REPAIR: SHX194

## 2023-02-01 LAB — GLUCOSE, CAPILLARY
Glucose-Capillary: 261 mg/dL — ABNORMAL HIGH (ref 70–99)
Glucose-Capillary: 273 mg/dL — ABNORMAL HIGH (ref 70–99)
Glucose-Capillary: 194 mg/dL — ABNORMAL HIGH (ref 70–99)

## 2023-02-01 SURGERY — REPAIR, HERNIA, INGUINAL, LAPAROSCOPIC
Anesthesia: General | Laterality: Left

## 2023-02-01 MED ORDER — ACETAMINOPHEN 10 MG/ML IV SOLN: 1000.0000 mg | Freq: Once | INTRAVENOUS | Status: DC | PRN

## 2023-02-01 MED ORDER — ENOXAPARIN SODIUM 40 MG/0.4ML IJ SOSY
40.0000 mg | PREFILLED_SYRINGE | Freq: Once | INTRAMUSCULAR | Status: AC
  Administered 2023-02-01: 40 mg via SUBCUTANEOUS
  Filled 2023-02-01: qty 0.4

## 2023-02-01 MED ORDER — DEXAMETHASONE SODIUM PHOSPHATE 10 MG/ML IJ SOLN
INTRAMUSCULAR | Status: AC
  Filled 2023-02-01: qty 1

## 2023-02-01 MED ORDER — 0.9 % SODIUM CHLORIDE (POUR BTL) OPTIME
TOPICAL | Status: DC | PRN
  Administered 2023-02-01: 1000 mL

## 2023-02-01 MED ORDER — CHLORHEXIDINE GLUCONATE CLOTH 2 % EX PADS: 6.0000 | MEDICATED_PAD | Freq: Once | CUTANEOUS | Status: DC

## 2023-02-01 MED ORDER — FENTANYL CITRATE PF 50 MCG/ML IJ SOSY
25.0000 ug | PREFILLED_SYRINGE | INTRAMUSCULAR | Status: DC | PRN
  Administered 2023-02-01: 25 ug via INTRAVENOUS

## 2023-02-01 MED ORDER — ONDANSETRON HCL 4 MG/2ML IJ SOLN
INTRAMUSCULAR | Status: DC | PRN
  Administered 2023-02-01: 4 mg via INTRAVENOUS

## 2023-02-01 MED ORDER — ONDANSETRON HCL 4 MG/2ML IJ SOLN
INTRAMUSCULAR | Status: AC
  Filled 2023-02-01: qty 2

## 2023-02-01 MED ORDER — BUPIVACAINE-EPINEPHRINE 0.25% -1:200000 IJ SOLN
INTRAMUSCULAR | Status: AC
  Filled 2023-02-01: qty 1

## 2023-02-01 MED ORDER — CEFAZOLIN SODIUM-DEXTROSE 2-4 GM/100ML-% IV SOLN
2.0000 g | INTRAVENOUS | Status: AC
  Administered 2023-02-01: 2 g via INTRAVENOUS
  Filled 2023-02-01: qty 100

## 2023-02-01 MED ORDER — LIDOCAINE 2% (20 MG/ML) 5 ML SYRINGE
INTRAMUSCULAR | Status: DC | PRN
  Administered 2023-02-01: 1.5 mg/kg/h via INTRAVENOUS

## 2023-02-01 MED ORDER — BUPIVACAINE LIPOSOME 1.3 % IJ SUSP
INTRAMUSCULAR | Status: AC
  Filled 2023-02-01: qty 20

## 2023-02-01 MED ORDER — FENTANYL CITRATE (PF) 100 MCG/2ML IJ SOLN
INTRAMUSCULAR | Status: AC
  Filled 2023-02-01: qty 2

## 2023-02-01 MED ORDER — OXYCODONE-ACETAMINOPHEN 5-325 MG PO TABS
1.0000 | ORAL_TABLET | ORAL | 0 refills | Status: DC | PRN
Start: 2023-02-01 — End: 2023-10-03

## 2023-02-01 MED ORDER — LACTATED RINGERS IV SOLN: INTRAVENOUS | Status: DC | PRN

## 2023-02-01 MED ORDER — ONDANSETRON HCL 4 MG/2ML IJ SOLN: 4.0000 mg | Freq: Once | INTRAMUSCULAR | Status: DC | PRN

## 2023-02-01 MED ORDER — CHLORHEXIDINE GLUCONATE 0.12 % MT SOLN
15.0000 mL | Freq: Once | OROMUCOSAL | Status: AC
  Administered 2023-02-01: 15 mL via OROMUCOSAL

## 2023-02-01 MED ORDER — PROPOFOL 10 MG/ML IV BOLUS
INTRAVENOUS | Status: DC | PRN
  Administered 2023-02-01: 50 mg via INTRAVENOUS
  Administered 2023-02-01: 110 mg via INTRAVENOUS

## 2023-02-01 MED ORDER — LACTATED RINGERS IV SOLN: INTRAVENOUS | Status: DC

## 2023-02-01 MED ORDER — BUPIVACAINE-EPINEPHRINE (PF) 0.25% -1:200000 IJ SOLN
INTRAMUSCULAR | Status: DC | PRN
  Administered 2023-02-01: 30 mL via PERINEURAL

## 2023-02-01 MED ORDER — ORAL CARE MOUTH RINSE: 15.0000 mL | Freq: Once | OROMUCOSAL | Status: AC

## 2023-02-01 MED ORDER — FENTANYL CITRATE PF 50 MCG/ML IJ SOSY
PREFILLED_SYRINGE | INTRAMUSCULAR | Status: AC
  Administered 2023-02-01: 25 ug via INTRAVENOUS
  Filled 2023-02-01: qty 1

## 2023-02-01 MED ORDER — ACETAMINOPHEN 500 MG PO TABS
1000.0000 mg | ORAL_TABLET | ORAL | Status: AC
  Administered 2023-02-01: 1000 mg via ORAL
  Filled 2023-02-01: qty 2

## 2023-02-01 MED ORDER — BUPIVACAINE LIPOSOME 1.3 % IJ SUSP
INTRAMUSCULAR | Status: DC | PRN
  Administered 2023-02-01: 20 mL

## 2023-02-01 MED ORDER — PROPOFOL 10 MG/ML IV BOLUS
INTRAVENOUS | Status: AC
  Filled 2023-02-01: qty 20

## 2023-02-01 MED ORDER — ROCURONIUM BROMIDE 100 MG/10ML IV SOLN
INTRAVENOUS | Status: DC | PRN
  Administered 2023-02-01: 50 mg via INTRAVENOUS

## 2023-02-01 MED ORDER — SUGAMMADEX SODIUM 200 MG/2ML IV SOLN
INTRAVENOUS | Status: DC | PRN
Start: 2023-02-01 — End: 2023-02-01
  Administered 2023-02-01: 240 mg via INTRAVENOUS

## 2023-02-01 MED ORDER — LIDOCAINE HCL (CARDIAC) PF 100 MG/5ML IV SOSY
PREFILLED_SYRINGE | INTRAVENOUS | Status: DC | PRN
  Administered 2023-02-01: 80 mg via INTRAVENOUS

## 2023-02-01 MED ORDER — DEXMEDETOMIDINE HCL IN NACL 80 MCG/20ML IV SOLN
INTRAVENOUS | Status: DC | PRN
Start: 2023-02-01 — End: 2023-02-01
  Administered 2023-02-01 (×2): 6 ug via INTRAVENOUS

## 2023-02-01 MED ORDER — GABAPENTIN 300 MG PO CAPS
300.0000 mg | ORAL_CAPSULE | ORAL | Status: AC
  Administered 2023-02-01: 300 mg via ORAL
  Filled 2023-02-01: qty 1

## 2023-02-01 MED ORDER — BUPIVACAINE LIPOSOME 1.3 % IJ SUSP: 20.0000 mL | Freq: Once | INTRAMUSCULAR | Status: DC

## 2023-02-01 MED ORDER — FENTANYL CITRATE (PF) 100 MCG/2ML IJ SOLN
INTRAMUSCULAR | Status: DC | PRN
  Administered 2023-02-01 (×2): 50 ug via INTRAVENOUS

## 2023-02-01 MED ORDER — INSULIN ASPART 100 UNIT/ML IJ SOLN
0.0000 [IU] | INTRAMUSCULAR | Status: AC | PRN
  Administered 2023-02-01: 6 [IU] via SUBCUTANEOUS
  Administered 2023-02-01: 8 [IU] via SUBCUTANEOUS
  Filled 2023-02-01: qty 1

## 2023-02-01 SURGICAL SUPPLY — 37 items
ADH SKN CLS APL DERMABOND .7 (GAUZE/BANDAGES/DRESSINGS) ×1
APL PRP STRL LF DISP 70% ISPRP (MISCELLANEOUS) ×1
BAG COUNTER SPONGE SURGICOUNT (BAG) ×1 IMPLANT
BAG SPNG CNTER NS LX DISP (BAG) ×1
CABLE HIGH FREQUENCY MONO STRZ (ELECTRODE) ×1 IMPLANT
CHLORAPREP W/TINT 26 (MISCELLANEOUS) ×1 IMPLANT
DERMABOND ADVANCED .7 DNX12 (GAUZE/BANDAGES/DRESSINGS) ×1 IMPLANT
ELECT REM PT RETURN 15FT ADLT (MISCELLANEOUS) ×1 IMPLANT
GLOVE BIO SURGEON STRL SZ7.5 (GLOVE) ×1 IMPLANT
GLOVE INDICATOR 8.0 STRL GRN (GLOVE) ×1 IMPLANT
GOWN STRL REUS W/ TWL XL LVL3 (GOWN DISPOSABLE) ×2 IMPLANT
GOWN STRL REUS W/TWL XL LVL3 (GOWN DISPOSABLE) ×2
GRASPER SUT TROCAR 14GX15 (MISCELLANEOUS) ×1 IMPLANT
IRRIG SUCT STRYKERFLOW 2 WTIP (MISCELLANEOUS)
IRRIGATION SUCT STRKRFLW 2 WTP (MISCELLANEOUS) IMPLANT
KIT BASIN OR (CUSTOM PROCEDURE TRAY) ×1 IMPLANT
KIT TURNOVER KIT A (KITS) IMPLANT
MARKER SKIN DUAL TIP RULER LAB (MISCELLANEOUS) ×1 IMPLANT
MESH 3DMAX 5X7 LT XLRG (Mesh General) IMPLANT
NDL INSUFFLATION 14GA 120MM (NEEDLE) ×2 IMPLANT
NEEDLE INSUFFLATION 14GA 120MM (NEEDLE) ×2
RELOAD STAPLE 4.0 BLU F/HERNIA (INSTRUMENTS) IMPLANT
RELOAD STAPLE 4.8 BLK F/HERNIA (STAPLE) ×1 IMPLANT
RELOAD STAPLE HERNIA 4.0 BLUE (INSTRUMENTS)
RELOAD STAPLE HERNIA 4.8 BLK (STAPLE) ×1
SCISSORS LAP 5X35 DISP (ENDOMECHANICALS) ×1 IMPLANT
SET TUBE SMOKE EVAC HIGH FLOW (TUBING) ×1 IMPLANT
SPIKE FLUID TRANSFER (MISCELLANEOUS) IMPLANT
STAPLER HERNIA 12 8.5 360D (INSTRUMENTS) ×1 IMPLANT
SUT MNCRL AB 4-0 PS2 18 (SUTURE) ×1 IMPLANT
SUT VICRYL 0 UR6 27IN ABS (SUTURE) IMPLANT
TOWEL OR 17X26 10 PK STRL BLUE (TOWEL DISPOSABLE) ×1 IMPLANT
TRAY FOL W/BAG SLVR 16FR STRL (SET/KITS/TRAYS/PACK) ×1 IMPLANT
TRAY FOLEY W/BAG SLVR 16FR LF (SET/KITS/TRAYS/PACK) ×1
TRAY LAPAROSCOPIC (CUSTOM PROCEDURE TRAY) ×1 IMPLANT
TROCAR ADV FIXATION 12X100MM (TROCAR) ×1 IMPLANT
TROCAR Z-THREAD OPTICAL 5X100M (TROCAR) ×2 IMPLANT

## 2023-02-01 NOTE — Op Note (Signed)
   Patient: Luis Lindsey (1951-04-10, [id number])  Date of Surgery: 02/01/23  Preoperative Diagnosis: LEFT INGUINAL HERNIA   Postoperative Diagnosis: LEFT INGUINAL HERNIA   Surgical Procedure: LAPAROSCOPIC LEFT INGUINAL HERNIA REPAIR WITH MESH: [id number]   Operative Team Members:  Surgeons and Role:    * Donisha Hoch, Hyman Hopes, MD - Primary   Anesthesiologist: Trevor Iha, MD CRNA: Elyn Peers, CRNA; Deri Fuelling, CRNA   Anesthesia: General   Fluids:  Total I/O In: 100 [IV Piggyback:100] Out: -   Complications: None  Drains:  None  Specimen: None  Disposition:  PACU - hemodynamically stable.  Plan of Care: Discharge to home after PACU  Indications for Procedure: Luis Lindsey is a 72 y.o. male who presented with a left inguinal hernia.  I recommended laparoscopic left inguinal hernia repair with mesh.  We discussed the procedure, its risks, benefits and alternatives.  After a full discussion and all questions answered the patient granted consent to proceed.  Findings:  Technique: Transabdominal preperitoneal (TAPP) Hernia Location: Left indirect inguinal hernia Mesh Size &Type:  Bard 3D max extra large left inguinal hernia mesh Mesh Fixation: Endo-Universal hernia stapler  Infection status: Patient: Private Patient Elective Case Case: Elective Infection Present At Time Of Surgery (PATOS): None   Description of Procedure:  The patient was positioned supine, padded and secured to the bed, with both arms tucked.  The abdomen was widely prepped and draped.  A time out procedure was performed.  A 1 cm infraumbilical incision was made.  The abdomen was entered without trauma to the underlying viscera.  The abdomen was insufflated to 15 mm of Hg.  A 12 mm trocar was inserted at the periumbilical incision.  Additional 5 mm trocars were placed in the left and right abdomen.  There was no trauma to the underlying viscera.  There was an indirect hernia on the  LEFT.  Utilizing a transabdominal pre peritoneal technique (TAPP), a horizontal incision was made in the peritoneum, immediately below the umbilicus.  Dissection was carried out in the pre peritoneal space down to the level of the hernia sac which was reduced into the peritoneal cavity completely.  The cord contents were parietalized and preserved.  A large pre peritoneal dissection was performed to uncover the direct, indirect, femoral and obturator spaces.  Cooper's ligament was uncovered medially and the psoas muscle uncovered laterally.  The mesh, as documented above, was opened and advanced into the pre peritoneal position so that it more than adequately covered the indirect, direct, femoral and obturator spaces.  The mesh laid flat, with no inferior folds and covered the entire myopectineal orifice.  The mesh was fixated with the endo-universal hernia stapler to Cooper's ligament and the posterior aspect of the rectus muscle.  The peritoneal flap was closed with the same device.  There were no peritoneal defects or exposed mesh at the conclusion.  The umbilical trocar was removed and the fascial defect was closed with a 0 Vicryl suture.  The peritoneal cavity was completely desufflated, the trocars removed and the skin closed with 4-0 Monocryl subcuticular suture and skin glue.  All sponge and needle counts were correct at the end of the case.  Ivar Drape, MD General, Bariatric, & Minimally Invasive Surgery [facility], [place]

## 2023-02-01 NOTE — Transfer of Care (Signed)
Immediate Anesthesia Transfer of Care Note  Patient: Luis Lindsey  Procedure(s) Performed: LAPAROSCOPIC LEFT INGUINAL HERNIA REPAIR WITH MESH (Left)  Patient Location: PACU  Anesthesia Type:General  Level of Consciousness: awake and alert   Airway & Oxygen Therapy: Patient Spontanous Breathing and Patient connected to face mask oxygen  Post-op Assessment: Report given to RN and Post -op Vital signs reviewed and stable  Post vital signs: Reviewed and stable  Last Vitals:  Vitals Value Taken Time  BP 140/47 02/01/23 1025  Temp    Pulse 65 02/01/23 1028  Resp 19 02/01/23 1028  SpO2 100 % 02/01/23 1028  Vitals shown include unfiled device data.  Last Pain:  Vitals:   02/01/23 0752  TempSrc: Oral         Complications: No notable events documented.

## 2023-02-01 NOTE — Anesthesia Postprocedure Evaluation (Signed)
Anesthesia Post Note  Patient: Luis Lindsey  Procedure(s) Performed: LAPAROSCOPIC LEFT INGUINAL HERNIA REPAIR WITH MESH (Left)     Patient location during evaluation: PACU Anesthesia Type: General Level of consciousness: awake and alert Pain management: pain level controlled Vital Signs Assessment: post-procedure vital signs reviewed and stable Respiratory status: spontaneous breathing, nonlabored ventilation, respiratory function stable and patient connected to nasal cannula oxygen Cardiovascular status: blood pressure returned to baseline and stable Postop Assessment: no apparent nausea or vomiting Anesthetic complications: no   No notable events documented.  Last Vitals:  Vitals:   02/01/23 1200 02/01/23 1217  BP: (!) 146/55 120/65  Pulse: (!) 59 80  Resp: 12 16  Temp:  36.6 C  SpO2: 100% 99%    Last Pain:  Vitals:   02/01/23 1217  TempSrc:   PainSc: 4                  Trevor Iha

## 2023-02-01 NOTE — Anesthesia Procedure Notes (Signed)
Procedure Name: Intubation Date/Time: 02/01/2023 9:28 AM  Performed by: Deri Fuelling, CRNAPre-anesthesia Checklist: Patient identified, Emergency Drugs available, Suction available and Patient being monitored Patient Re-evaluated:Patient Re-evaluated prior to induction Oxygen Delivery Method: Circle system utilized Preoxygenation: Pre-oxygenation with 100% oxygen Induction Type: IV induction Ventilation: Mask ventilation without difficulty Laryngoscope Size: Mac and 4 Grade View: Grade I Tube type: Oral Tube size: 7.5 mm Number of attempts: 1 Airway Equipment and Method: Stylet and Oral airway Placement Confirmation: ETT inserted through vocal cords under direct vision, positive ETCO2 and breath sounds checked- equal and bilateral Secured at: 22 cm Tube secured with: Tape Dental Injury: Teeth and Oropharynx as per pre-operative assessment

## 2023-02-01 NOTE — H&P (Signed)
Admitting Physician: Hyman Hopes Joshuah Minella  Service: General Surgery  CC: Hernia  Subjective   HPI: Luis Lindsey is a 72 y.o. male who is seen today as an office consultation for evaluation of New Consultation (HERNIA)  Mr. Leal has a painful bulge in his left groin. It is nearby his previous femoral popliteal bypass incision so he was originally evaluated by Dr. Chestine Spore of vascular surgery and then referred to the general surgery office as it appears to be a hernia. It has limited his activities and is often quite painful. He denies any bowel issues. He would like it repaired.   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   Dysphagia    Esophageal dysmotility    GERD (gastroesophageal reflux disease)    Heart murmur    pt had recent echocardiogram   History of hiatal hernia    History of Holter monitoring 2009   History of kidney stones    HNP (herniated nucleus pulposus), lumbar    L4-5   HYPERCHOLESTEROLEMIA 11/30/2009   Hyperlipidemia    HYPERTENSION 11/30/2009   Kidney stones    Numbness and tingling    Peripheral vascular disease (HCC)    Right carotid bruit    SMOKER 11/30/2009   Stroke (HCC) 2012   URINARY CALCULUS 11/30/2009    Past Surgical History:  Procedure Laterality Date   ABDOMINAL AORTOGRAM W/LOWER EXTREMITY N/A 06/18/2018   Procedure: ABDOMINAL AORTOGRAM W/LOWER EXTREMITY;  Surgeon: Cephus Shelling, MD;  Location: [facility];  Service: Cardiovascular;  Laterality: N/A;   ABDOMINAL AORTOGRAM W/LOWER EXTREMITY N/A 12/02/2019   Procedure: ABDOMINAL AORTOGRAM W/LOWER EXTREMITY;  Surgeon: Cephus Shelling, MD;  Location: [facility];  Service: Cardiovascular;  Laterality: N/A;   ABDOMINAL AORTOGRAM W/LOWER EXTREMITY N/A 03/23/2021   Procedure:  ABDOMINAL AORTOGRAM W/LOWER EXTREMITY;  Surgeon: Cephus Shelling, MD;  Location: [facility];  Service: Cardiovascular;  Laterality: N/A;   AORTIC VALVE REPLACEMENT N/A 10/25/2021   Procedure: AORTIC VALVE REPLACEMENT (AVR)USING INSPIRIS RESILIA  AORTIC VALVE;  Surgeon: Corliss Skains, MD;  Location: [facility];  Service: Open Heart Surgery;  Laterality: N/A;   arthroscopy of right shoulder Right    Jan 2019, June 2019-- done at [facility], Dr. Magnus Ivan   BACK SURGERY     CATARACT EXTRACTION W/ INTRAOCULAR LENS  IMPLANT, BILATERAL     CORONARY ARTERY BYPASS GRAFT N/A 10/25/2021   Procedure: CORONARY ARTERY BYPASS GRAFTING (CABG) X 3 USING LEFT INTERNAL MAMMARY ARTERY AND RIGHT GREATER SAPHENOUS VEIN;  Surgeon: Corliss Skains, MD;  Location: [facility];  Service: Open Heart Surgery;  Laterality: N/A;   CYSTOSCOPY     ENDARTERECTOMY Right 08/24/2019   Procedure: ENDARTERECTOMY CAROTID;  Surgeon: Cephus Shelling, MD;  Location: [facility] OR;  Service: Vascular;  Laterality: Right;   ENDARTERECTOMY FEMORAL Left 06/05/2021   Procedure: ENDARTERECTOMY FEMORAL WITH PROFUNDAPLASTY;  Surgeon: Cephus Shelling, MD;  Location: [facility] OR;  Service: Vascular;  Laterality: Left;   ENDOVEIN HARVEST OF GREATER SAPHENOUS VEIN Right 10/25/2021   Procedure: ENDOVEIN HARVEST OF GREATER SAPHENOUS VEIN;  Surgeon: Corliss Skains, MD;  Location: [facility];  Service: Open Heart Surgery;  Laterality: Right;  FACIAL COSMETIC SURGERY  2014   FEMORAL-POPLITEAL BYPASS GRAFT Left 06/05/2021   Procedure: LEFT FEMORAL-POPLITEAL ARTERY BYPASS GRAFTING USING 6mm PROPATEN VASCULAR REMOVABLE RING GRAFT;  Surgeon: Cephus Shelling, MD;  Location: [facility];  Service: Vascular;  Laterality: Left;   FINGER SURGERY  2002   LEFT HEART CATH AND CORONARY ANGIOGRAPHY N/A 10/05/2021   Procedure: LEFT HEART CATH AND CORONARY ANGIOGRAPHY;  Surgeon: Yvonne Kendall, MD;  [facility]: [facility];  Service:  Cardiovascular;  Laterality: N/A;   LUMBAR LAMINECTOMY/DECOMPRESSION MICRODISCECTOMY Left 08/01/2018   Procedure: Microdiscectomy - Lumbar four-Lumbar five - left;  Surgeon: Tia Alert, MD;  Location: [facility] OR;  Service: Neurosurgery;  Laterality: Left;   LUMBAR LAMINECTOMY/DECOMPRESSION MICRODISCECTOMY Left 10/15/2018   Procedure: Re-do Microdiscectomy - left - Lumbar four-Lumbar five;  Surgeon: Tia Alert, MD;  Location: [facility] OR;  Service: Neurosurgery;  Laterality: Left;   NECK SURGERY  1986   PATCH ANGIOPLASTY Left 06/05/2021   Procedure: PATCH ANGIOPLASTY USING Livia Snellen BIOLOGIC PATCH;  Surgeon: Cephus Shelling, MD;  Location: [facility] OR;  Service: Vascular;  Laterality: Left;   PERIPHERAL VASCULAR ATHERECTOMY  12/02/2019   Procedure: PERIPHERAL VASCULAR ATHERECTOMY;  Surgeon: Cephus Shelling, MD;  [facility]: [facility];  Service: Cardiovascular;;  left SFA   PERIPHERAL VASCULAR INTERVENTION  06/18/2018   Procedure: PERIPHERAL VASCULAR INTERVENTION;  Surgeon: Cephus Shelling, MD;  [facility]: [facility];  Service: Cardiovascular;;  Left external iliac   ROTATOR CUFF REPAIR Left    TEE WITHOUT CARDIOVERSION N/A 10/25/2021   Procedure: TRANSESOPHAGEAL ECHOCARDIOGRAM (TEE);  Surgeon: Corliss Skains, MD;  Location: [facility] OR;  Service: Open Heart Surgery;  Laterality: N/A;   UMBILICAL HERNIA REPAIR      Family History  Problem Relation Age of Onset   Diabetes Sister    Diabetes Mellitus I Sister    Stroke Father    Heart disease Father    CVA Father    Coronary artery disease Father    Diabetes Mother    Heart disease Mother    Diabetes Mellitus I Mother    Coronary artery disease Mother    Coronary artery disease Brother    Heart disease Brother    Coronary artery disease Brother     Social:  reports that he quit smoking about 7 years ago. His smoking use included cigarettes. He started smoking about 47 years ago. He has been exposed to tobacco smoke. He has  never used smokeless tobacco. He reports that he does not drink alcohol and does not use drugs.  Allergies:  Allergies  Allergen Reactions   Codeine Nausea Only    Medications: Current Outpatient Medications  Medication Instructions   acetaminophen (TYLENOL) 1,000 mg, Oral, Every 6 hours PRN   amLODipine (NORVASC) 5 mg, Oral, Daily   aspirin EC 81 mg, Oral, Daily   benazepril (LOTENSIN) 20 mg, Oral, Daily   clopidogrel (PLAVIX) 75 MG tablet TAKE 1 TABLET BY MOUTH EVERY DAY   Continuous Blood Gluc Sensor (FREESTYLE LIBRE 2 SENSOR) MISC AS DIRECTED FOR BLOOD SUGAR CHECK DX E11.42   ezetimibe (ZETIA) 10 mg, Oral, Daily   ferrous sulfate 325 mg, Oral, Daily   gabapentin (NEURONTIN) 100-200 mg, Oral, See admin instructions, Take 100 mg by mouth in the morning and 200 mg at bedtime   insulin degludec (TRESIBA) 34 Units, Subcutaneous, Every morning   iron polysaccharides (NIFEREX) 150 mg, Oral, Daily   metFORMIN (GLUCOPHAGE-XR) 1,000 mg, Oral, 2 times daily  ONETOUCH VERIO test strip 1 each, 3 times daily   Ozempic (1 MG/DOSE) 1 mg, Subcutaneous, Every Wed   pantoprazole (PROTONIX) 40 mg, Oral, Daily with breakfast   pioglitazone (ACTOS) 45 MG tablet TAKE 1 TABLET DAILY (NEED APPOINTMENT FOR FURTHER REFILLS)   polyethylene glycol (MIRALAX / GLYCOLAX) 17 g, Oral, Daily PRN   rosuvastatin (CRESTOR) 40 mg, Oral, Daily    ROS - all of the below systems have been reviewed with the patient and positives are indicated with bold text General: chills, fever or night sweats Eyes: blurry vision or double vision ENT: epistaxis or sore throat Allergy/Immunology: itchy/watery eyes or nasal congestion Hematologic/Lymphatic: bleeding problems, blood clots or swollen lymph nodes Endocrine: temperature intolerance or unexpected weight changes Breast: new or changing breast lumps or nipple discharge Resp: cough, shortness of breath, or wheezing CV: chest pain or dyspnea on exertion GI: as per  HPI GU: dysuria, trouble voiding, or hematuria MSK: joint pain or joint stiffness Neuro: TIA or stroke symptoms Derm: pruritus and skin lesion changes Psych: anxiety and depression  Objective   PE Blood pressure (!) 146/53, pulse 67, temperature (!) 97.5 F (36.4 C), temperature source Oral, resp. rate 18, height 5\' 9"  (1.753 m), weight 64 kg, SpO2 100%. Constitutional: NAD; conversant; no deformities Eyes: Moist conjunctiva; no lid lag; anicteric; PERRL Neck: Trachea midline; no thyromegaly Lungs: Normal respiratory effort; no tactile fremitus CV: RRR; no palpable thrills; no pitting edema GI: Abd left inguinal hernia; no palpable hepatosplenomegaly MSK: Normal range of motion of extremities; no clubbing/cyanosis Psychiatric: Appropriate affect; alert and oriented x3 Lymphatic: No palpable cervical or axillary lymphadenopathy  Results for orders placed or performed during the hospital encounter of 02/01/23 (from the past 24 hour(s))  Glucose, capillary     Status: Abnormal   Collection Time: 02/01/23  7:58 AM  Result Value Ref Range   Glucose-Capillary 261 (H) 70 - 99 mg/dL   Comment 1 Notify RN    Comment 2 Document in Chart     Imaging Orders  No imaging studies ordered today     Assessment and Plan   LARANCE VIGER is an 72 y.o. male with a left inguinal hernia.  Left inguinal hernia   Mr. Reese has a left inguinal hernia. I recommended laparoscopic left inguinal hernia repair with mesh. We discussed the procedure itself as well as its risk, benefits, and alternatives. After full discussion all questions answered the patient granted consent to proceed. We will ask her preoperative evaluation from the patient's cardiologist and then my surgery schedulers will reach out to the patient to schedule surgery.    Quentin Ore, MD  [facility] Use [email or web address] to contact on call provider

## 2023-02-01 NOTE — Discharge Instructions (Signed)
 GROIN HERNIA REPAIR POST OPERATIVE INSTRUCTIONS  Thinking Clearly  The anesthesia may cause you to feel different for 1 or 2 days. Do not drive, drink alcohol, or make any big decisions for at least 2 days.  Nutrition When you wake up, you will be able to drink small amounts of liquid. If you do not feel sick, you can slowly advance your diet to regular foods. Continue to drink lots of fluids, usually about 8 to 10 glasses per day. Eat a high-fiber diet so you don't strain during bowel movements. High-Fiber Foods Foods high in fiber include beans, bran cereals and whole-grain breads, peas, dried fruit (figs, apricots, and dates), raspberries, blackberries, strawberries, sweet corn, broccoli, baked potatoes with skin, plums, pears, apples, greens, and nuts. Activity Slowly increase your activity. Be sure to get up and walk every hour or so to prevent blood clots. No heavy lifting or strenuous activity for 4 weeks following surgery to prevent hernias at your incision sites or recurrence of your hernia. It is normal to feel tired. You may need more sleep than usual.  Get your rest but make sure to get up and move around frequently to prevent blood clots and pneumonia.  Work and Return to School You can go back to work when you feel well enough. Discuss the timing with your surgeon. You can usually go back to school or work 1 week or less after an laparoscopic or an open repair. If your work requires heavy lifting or strenuous activity you need to be placed on light duty for 4 weeks following surgery. You can return to gym class, sports or other physical activities 4 weeks after surgery.  Wound Care You may experience significant bruising in the groin including into the scrotum in males.  Rest, elevating the groin and scrotum above the level of the heart, ice and compression with tight fitting underwear can help.  Always wash your hands before and after touching near your incision site. Do  not soak in a bathtub until cleared at your follow up appointment. You may take a shower 24 hours after surgery. A small amount of drainage from the incision is normal. If the drainage is thick and yellow or the site is red, you may have an infection, so call your surgeon. If you have a drain in one of your incisions, it will be taken out in office when the drainage stops. Steri-Strips will fall off in 7 to 10 days or they will be removed during your first office visit. If you have dermabond glue covering over the incision, allow the glue to flake off on its own. Protect the new skin, especially from the sun. The sun can burn and cause darker scarring. Your scar will heal in about 4 to 6 weeks and will become softer and continue to fade over the next year.  The cosmetic appearance of the incisions will improve over the course of the first year after surgery. Sensation around your incision will return in a few weeks or months.  Bowel Movements After intestinal surgery, you may have loose watery stools for several days. If watery diarrhea lasts longer than 3 days, contact your surgeon. Pain medication (narcotics) can cause constipation. Increase the fiber in your diet with high-fiber foods if you are constipated. You can take an over the counter stool softener like Colace to avoid constipation.  Additional over the counter medications can also be used if Colace isn't sufficient (for example, Milk of Magnesia or Miralax).    Pain The amount of pain is different for each person. Some people need only 1 to 3 doses of pain control medication, while others need more. Take alternating doses of tylenol and ibuprofen around the clock for the first five days following surgery.  This will provide a baseline of pain control and help with inflammation.  Take the narcotic pain medication in addition if needed for severe pain.  Contact Your Surgeon at [phone], if you have: Pain that will not go away Pain that  gets worse A fever of more than 101F (38.3C) Repeated vomiting Swelling, redness, bleeding, or bad-smelling drainage from your wound site Strong abdominal pain No bowel movement or unable to pass gas for 3 days Watery diarrhea lasting longer than 3 days  Pain Control The goal of pain control is to minimize pain, keep you moving and help you heal. Your surgical team will work with you on your pain plan. Most often a combination of therapies and medications are used to control your pain. You may also be given medication (local anesthetic) at the surgical site. This may help control your pain for several days. Extreme pain puts extra stress on your body at a time when your body needs to focus on healing. Do not wait until your pain has reached a level "10" or is unbearable before telling your doctor or nurse. It is much easier to control pain before it becomes severe. Following a laparoscopic procedure, pain is sometimes felt in the shoulder. This is due to the gas inserted into your abdomen during the procedure. Moving and walking helps to decrease the gas and the right shoulder pain.  Use the guide below for ways to manage your post-operative pain. Learn more by going to facs.org/safepaincontrol.  How Intense Is My Pain Common Therapies to Feel Better       I hardly notice my pain, and it does not interfere with my activities.  I notice my pain and it distracts me, but I can still do activities (sitting up, walking, standing).  Non-Medication Therapies  Ice (in a bag, applied over clothing at the surgical site), elevation, rest, meditation, massage, distraction (music, TV, play) walking and mild exercise Splinting the abdomen with pillows +  Non-Opioid Medications Acetaminophen (Tylenol) Non-steroidal anti-inflammatory drugs (NSAIDS) Aspirin, Ibuprofen (Motrin, Advil) Naproxen (Aleve) Take these as needed, when you feel pain. Both acetaminophen and NSAIDs help to decrease pain  and swelling (inflammation).      My pain is hard to ignore and is more noticeable even when I rest.  My pain interferes with my usual activities.  Non-Medication Therapies  +  Non-Opioid medications  Take on a regular schedule (around-the-clock) instead of as needed. (For example, Tylenol every 6 hours at 9:00 am, 3:00 pm, 9:00 pm, 3:00 am and Motrin every 6 hours at 12:00 am, 6:00 am, 12:00 pm, 6:00 pm)         I am focused on my pain, and I am not doing my daily activities.  I am groaning in pain, and I cannot sleep. I am unable to do anything.  My pain is as bad as it could be, and nothing else matters.  Non-Medication Therapies  +  Around-the-Clock Non-Opioid Medications  +  Short-acting opioids  Opioids should be used with other medications to manage severe pain. Opioids block pain and give a feeling of euphoria (feel high). Addiction, a serious side effect of opioids, is rare with short-term (a few days) use.  Examples of short-acting opioids   include: Tramadol (Ultram), Hydrocodone (Norco, Vicodin), Hydromorphone (Dilaudid), Oxycodone (Oxycontin)     The above directions have been adapted from the [employer] Surgical Patient Education Program.  Please refer to the ACS website if needed: [email or web address]/-/[email or web address]   Paul Stechschulte, MD [facility] [address], [address], [place], [place]  [address] ?  [address] [address], [place], [place]   [address] [phone] ? [phone] ? FAX [phone] Web site: [email or web address]  

## 2023-02-04 ENCOUNTER — Encounter (HOSPITAL_COMMUNITY): Payer: Self-pay | Admitting: Surgery

## 2023-02-06 ENCOUNTER — Ambulatory Visit: Payer: PPO | Admitting: Occupational Therapy

## 2023-02-06 ENCOUNTER — Other Ambulatory Visit: Payer: Self-pay | Admitting: Family Medicine

## 2023-02-06 ENCOUNTER — Ambulatory Visit: Payer: PPO | Admitting: Physical Therapy

## 2023-02-06 DIAGNOSIS — K5900 Constipation, unspecified: Secondary | ICD-10-CM | POA: Diagnosis not present

## 2023-02-06 DIAGNOSIS — R109 Unspecified abdominal pain: Secondary | ICD-10-CM | POA: Diagnosis not present

## 2023-02-06 DIAGNOSIS — R634 Abnormal weight loss: Secondary | ICD-10-CM

## 2023-02-06 DIAGNOSIS — R1032 Left lower quadrant pain: Secondary | ICD-10-CM | POA: Diagnosis not present

## 2023-02-08 ENCOUNTER — Other Ambulatory Visit: Payer: PPO

## 2023-02-13 ENCOUNTER — Ambulatory Visit: Payer: PPO | Admitting: Physical Therapy

## 2023-02-13 ENCOUNTER — Telehealth: Payer: Self-pay | Admitting: Physical Therapy

## 2023-02-13 ENCOUNTER — Ambulatory Visit: Payer: PPO | Admitting: Occupational Therapy

## 2023-02-14 DIAGNOSIS — R110 Nausea: Secondary | ICD-10-CM | POA: Diagnosis not present

## 2023-02-14 DIAGNOSIS — K5900 Constipation, unspecified: Secondary | ICD-10-CM | POA: Diagnosis not present

## 2023-02-14 DIAGNOSIS — R1032 Left lower quadrant pain: Secondary | ICD-10-CM | POA: Diagnosis not present

## 2023-02-18 ENCOUNTER — Ambulatory Visit: Payer: PPO | Admitting: Physical Therapy

## 2023-02-19 DIAGNOSIS — K5909 Other constipation: Secondary | ICD-10-CM | POA: Diagnosis not present

## 2023-02-19 DIAGNOSIS — D508 Other iron deficiency anemias: Secondary | ICD-10-CM | POA: Diagnosis not present

## 2023-02-20 ENCOUNTER — Ambulatory Visit: Payer: PPO | Admitting: Occupational Therapy

## 2023-02-20 DIAGNOSIS — M6281 Muscle weakness (generalized): Secondary | ICD-10-CM

## 2023-02-20 DIAGNOSIS — R251 Tremor, unspecified: Secondary | ICD-10-CM

## 2023-02-20 DIAGNOSIS — R278 Other lack of coordination: Secondary | ICD-10-CM

## 2023-02-20 NOTE — Patient Instructions (Signed)
Mouse Recommendations: Go to computer settings, look for "Mouse Settings"  Look for "Cursor speed" and adjust to slow down the reaction time of the mouse  Look for "Pointer speed" - might be under additional mouse options; adjust to slow down speed this way  Try various table textures underneath mouse for added resistance Eating Recommendations: Place paper plate on top of walled plate to help with stability but reduce the amount of dishes you are doing. The walled plate or bowl can be placed on top of foam shelf-liner for additional stability as well.   Use a lidded bowl for warming up items in the microwave. Remove with silicone oven mit.  Rocker knife to cut food.

## 2023-02-20 NOTE — Therapy (Signed)
OUTPATIENT OCCUPATIONAL THERAPY NEURO TREATMENT  Patient Name: Luis Lindsey MRN: [id number] DOB:[birth year], 72 y.o., male Today's Date: 02/20/2023  PCP: Irven Coe, MD  REFERRING PROVIDER: Levert Feinstein, MD  END OF SESSION:  OT End of Session - 02/20/23 1150     Visit Number 3    Number of Visits 7    Date for OT Re-Evaluation 03/08/23    Authorization Type [employer] - [place], no auth req.    Progress Note Due on Visit 7    OT Start Time 1151    OT Stop Time 1230    OT Time Calculation (min) 39 min    Activity Tolerance Patient tolerated treatment well    Behavior During Therapy WFL for tasks assessed/performed             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   Dysphagia    Esophageal dysmotility    GERD (gastroesophageal reflux disease)    Heart murmur    pt had recent echocardiogram   History of hiatal hernia    History of Holter monitoring 2009   History of kidney stones    HNP (herniated nucleus pulposus), lumbar    L4-5   HYPERCHOLESTEROLEMIA 11/30/2009   Hyperlipidemia    HYPERTENSION 11/30/2009   Kidney stones    Numbness and tingling    Peripheral vascular disease (HCC)    Right carotid bruit    SMOKER 11/30/2009   Stroke (HCC) 2012   URINARY CALCULUS 11/30/2009   Past Surgical History:  Procedure Laterality Date   ABDOMINAL AORTOGRAM W/LOWER EXTREMITY N/A 06/18/2018   Procedure: ABDOMINAL AORTOGRAM W/LOWER EXTREMITY;  Surgeon: Cephus Shelling, MD;  [facility]: [facility];  Service: Cardiovascular;  Laterality: N/A;   ABDOMINAL AORTOGRAM W/LOWER EXTREMITY N/A 12/02/2019   Procedure: ABDOMINAL AORTOGRAM W/LOWER EXTREMITY;  Surgeon: Cephus Shelling, MD;  Location: [facility];  Service: Cardiovascular;  Laterality: N/A;   ABDOMINAL  AORTOGRAM W/LOWER EXTREMITY N/A 03/23/2021   Procedure: ABDOMINAL AORTOGRAM W/LOWER EXTREMITY;  Surgeon: Cephus Shelling, MD;  Location: [facility];  Service: Cardiovascular;  Laterality: N/A;   AORTIC VALVE REPLACEMENT N/A 10/25/2021   Procedure: AORTIC VALVE REPLACEMENT (AVR)USING INSPIRIS RESILIA  AORTIC VALVE;  Surgeon: Corliss Skains, MD;  Location: [facility];  Service: Open Heart Surgery;  Laterality: N/A;   arthroscopy of right shoulder Right    Jan 2019, June 2019-- done at [facility], Dr. Magnus Ivan   BACK SURGERY     CATARACT EXTRACTION W/ INTRAOCULAR LENS  IMPLANT, BILATERAL     CORONARY ARTERY BYPASS GRAFT N/A 10/25/2021   Procedure: CORONARY ARTERY BYPASS GRAFTING (CABG) X 3 USING LEFT INTERNAL MAMMARY ARTERY AND RIGHT GREATER SAPHENOUS VEIN;  Surgeon: Corliss Skains, MD;  Location: [facility];  Service: Open Heart Surgery;  Laterality: N/A;   CYSTOSCOPY     ENDARTERECTOMY Right 08/24/2019   Procedure: ENDARTERECTOMY CAROTID;  Surgeon: Cephus Shelling, MD;  Location: [facility] OR;  Service: Vascular;  Laterality: Right;   ENDARTERECTOMY FEMORAL Left 06/05/2021   Procedure: ENDARTERECTOMY FEMORAL WITH PROFUNDAPLASTY;  Surgeon: Cephus Shelling, MD;  Location[facility] OR;  Service: Vascular;  Laterality: Left;   ENDOVEIN HARVEST OF GREATER  SAPHENOUS VEIN Right 10/25/2021   Procedure: ENDOVEIN HARVEST OF GREATER SAPHENOUS VEIN;  Surgeon: Corliss Skains, MD;  Location: [facility];  Service: Open Heart Surgery;  Laterality: Right;   FACIAL COSMETIC SURGERY  2014   FEMORAL-POPLITEAL BYPASS GRAFT Left 06/05/2021   Procedure: LEFT FEMORAL-POPLITEAL ARTERY BYPASS GRAFTING USING 6mm PROPATEN VASCULAR REMOVABLE RING GRAFT;  Surgeon: Cephus Shelling, MD;  Location: [facility];  Service: Vascular;  Laterality: Left;   FINGER SURGERY  2002   INGUINAL HERNIA REPAIR Left 02/01/2023   Procedure: LAPAROSCOPIC LEFT INGUINAL HERNIA REPAIR WITH MESH;  Surgeon: Quentin Ore, MD;  Location: [facility];  Service: General;  Laterality: Left;  90   LEFT HEART CATH AND CORONARY ANGIOGRAPHY N/A 10/05/2021   Procedure: LEFT HEART CATH AND CORONARY ANGIOGRAPHY;  Surgeon: Yvonne Kendall, MD;  [facility]: [facility];  Service: Cardiovascular;  Laterality: N/A;   LUMBAR LAMINECTOMY/DECOMPRESSION MICRODISCECTOMY Left 08/01/2018   Procedure: Microdiscectomy - Lumbar four-Lumbar five - left;  Surgeon: Tia Alert, MD;  Location: [facility] OR;  Service: Neurosurgery;  Laterality: Left;   LUMBAR LAMINECTOMY/DECOMPRESSION MICRODISCECTOMY Left 10/15/2018   Procedure: Re-do Microdiscectomy - left - Lumbar four-Lumbar five;  Surgeon: Tia Alert, MD;  Location: [facility] OR;  Service: Neurosurgery;  Laterality: Left;   NECK SURGERY  1986   PATCH ANGIOPLASTY Left 06/05/2021   Procedure: PATCH ANGIOPLASTY USING Livia Snellen BIOLOGIC PATCH;  Surgeon: Cephus Shelling, MD;  Location: [facility] OR;  Service: Vascular;  Laterality: Left;   PERIPHERAL VASCULAR ATHERECTOMY  12/02/2019   Procedure: PERIPHERAL VASCULAR ATHERECTOMY;  Surgeon: Cephus Shelling, MD;  [facility]: [facility];  Service: Cardiovascular;;  left SFA   PERIPHERAL VASCULAR INTERVENTION  06/18/2018   Procedure: PERIPHERAL VASCULAR INTERVENTION;  Surgeon: Cephus Shelling, MD;  [facility]: [facility];  Service: Cardiovascular;;  Left external iliac   ROTATOR CUFF REPAIR Left    TEE WITHOUT CARDIOVERSION N/A 10/25/2021   Procedure: TRANSESOPHAGEAL ECHOCARDIOGRAM (TEE);  Surgeon: Corliss Skains, MD;  Location: [facility] OR;  Service: Open Heart Surgery;  Laterality: N/A;   UMBILICAL HERNIA REPAIR     Patient Active Problem List   Diagnosis Date Noted   Essential tremor 12/26/2022   Paresthesia 12/26/2022   Hypokalemia 03/01/2022   Hypoalbuminemia 03/01/2022   Abnormal LFTs 03/01/2022   Symptomatic anemia 02/27/2022   S/P CABG x 3 02/27/2022   Iron deficiency anemia 02/27/2022   Complication of surgical procedure  02/13/2022   Degeneration of lumbar intervertebral disc 02/13/2022   S/P AVR (aortic valve replacement) 10/25/2021   Coronary artery disease of native artery of native heart with stable angina pectoris (HCC)    Abnormal cardiac CT angiography    PAD (peripheral artery disease) (HCC) 06/05/2021   S/P lumbar fusion 12/07/2020   Peripheral vascular disease (HCC) 04/06/2020   Cerebrovascular accident (CVA) (HCC) 04/06/2020   S/P carotid endarterectomy 04/06/2020   DM type 2 with diabetic peripheral neuropathy (HCC) 12/31/2019   Gait abnormality 12/31/2019   Tremor 12/07/2019   Carotid stenosis, asymptomatic, right 08/24/2019   Carotid disease, bilateral (HCC) 08/11/2019   S/P lumbar laminectomy 08/01/2018   PVD (peripheral vascular disease) (HCC) 06/10/2018   Left lumbar radiculopathy 04/01/2018   Peripheral neuropathy 04/01/2018   Left leg claudication (HCC) 04/01/2018   Status post arthroscopy of right shoulder 06/27/2017   Superior glenoid labrum lesion of right shoulder 06/10/2017   Chronic right shoulder pain 04/16/2017   Displaced fracture of proximal phalanx of right  great toe with routine healing 03/13/2017   Impingement syndrome of right shoulder 03/13/2017   Displaced fracture of proximal phalanx of right great toe, initial encounter for closed fracture 02/20/2017   Diabetes (HCC) 08/24/2013   History of Holter monitoring    Heart murmur    TIA (transient ischemic attack) 12/26/2012   Kidney stones    DM 11/30/2009   HYPERCHOLESTEROLEMIA 11/30/2009   Essential hypertension 11/30/2009   URINARY CALCULUS 11/30/2009   CAROTID BRUIT, RIGHT 11/30/2009    ONSET DATE: 01/01/2023 (date of referral) - noticed tremor in both hands a couple years ago  REFERRING DIAG: M62.81 (ICD-10-CM) - Muscle weakness (generalized) R27.8 (ICD-10-CM) - Other lack of coordination R25.1 (ICD-10-CM) - Tremor  THERAPY DIAG:  Other lack of coordination  Tremor  Muscle weakness  (generalized)  Rationale for Evaluation and Treatment: Rehabilitation  SUBJECTIVE:   SUBJECTIVE STATEMENT: He was able to get a weighted spoon.   Pt accompanied by: self  PERTINENT HISTORY: PMH of PAD s/p L SFA arthrectomy with angioplasty, left CEA, HTN, HLD, moderate AS   Neuropathy: burning tingling bilateral feet (ongoing 3 yrs), worsening tremor" "shaking in hands and legs (ongoing 2 years)  PRECAUTIONS: Fall and Other: hernia-avoid heaving lifting   WEIGHT BEARING RESTRICTIONS: No  PAIN:  Are you having pain? No  FALLS: Has patient fallen in last 6 months? No  LIVING ENVIRONMENT: Lives with: lives with their spouse [name] Lives in: House/apartment Stairs: Yes: External: 1 steps; none Has following equipment at home: Environmental consultant, cane  PLOF: Independent; retired [employer] architect; driver  PATIENT GOALS: Reduce affects of tremor on daily tasks  OBJECTIVE:   HAND DOMINANCE: Right  ADLs: Overall ADLs: mod I Eating: uses spoon and bowl mostly UB Dressing: avoids buttons LB Dressing: uses slip on shoes  Equipment: Shower seat with back and Grab bars  IADLs: Shopping: mod I Light housekeeping: mod I Meal Prep: mod I  Community mobility: driving Medication management: mod I Financial management: I Handwriting:  Pt reports his handwriting is about 30% of what it was; writes in cursive  MOBILITY STATUS:  occasional bouts of LOB requiring SBA; no AD  ACTIVITY TOLERANCE: Activity tolerance: good to fair  FUNCTIONAL OUTCOME MEASURES: PSFS: 3.7  Total score = sum of the activity scores/number of activities Minimum detectable change (90%CI) for average score = 2 points Minimum detectable change (90%CI) for single activity score = 3 points   UPPER EXTREMITY ROM:    BUE: WNL  UPPER EXTREMITY MMT:     BUE: WNL  HAND FUNCTION: Grip strength: Right: 64.3 lbs; Left: 79.8 lbs  COORDINATION: 9 Hole Peg test: Right: 57 sec; Left: 32 sec (arms abducted away from  body and tremor apparent on both sides) Box and Blocks:  Right 35 blocks, Left 35 blocks   SENSATION: Paresthesias reported in R finger tips  EDEMA: none reported or observed with exception to arthritic changes  MUSCLE TONE: WFL  COGNITION: Overall cognitive status: Within functional limits for tasks assessed  VISION: Subjective report: no changes/no difficulty Baseline vision: Wears glasses for reading only Visual history: cataracts and corrective eye surgery  PERCEPTION: WFL  PRAXIS: WFL  OBSERVATIONS: 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 Appears to be well-kept.  TODAY'S TREATMENT:  OT reviewed and expanded use of DME and tremor reduction strategies as noted in patient instructions in efforts to reduce the effects of tremor on completion of functional activities.    PATIENT EDUCATION: Education details: Tremor reduction strategies; AD Person educated: Patient Education method: Explanation, Demonstration, and Handouts Education comprehension: verbalized understanding, returned demonstration, and needs further education  HOME EXERCISE PROGRAM: 01/30/2023: tremor reduction strategies 02/20/2023: AD for eating and use of mouse  GOALS:  SHORT TERM GOALS: Target date: 02/20/2023  Patient will return demonstration with use of DME as needed for tremor reduction. Baseline: Goal status: MET  2.  Pt will independently verbalize at least 2 tremor reduction strategies.  Baseline:  Goal status: MET  LONG TERM GOALS: Target date: 03/08/2023  Patient will report at least two-point increase in average PSFS score or at least three-point increase in a single activity score indicating functionally significant improvement given minimum detectable  change.  Baseline: 3.7 total score (See above for individual activity scores) Goal status: INITIAL  2.  Patient will complete nine-hole peg with at least 5 second improvement with RUE. Baseline: Right: 57 sec; Left: 32 sec Goal status: INITIAL  ASSESSMENT:  CLINICAL IMPRESSION: Good understanding of tremor reduction strategies as needed to improve accuracy and safety with ADL and IADL completion. Given his recent surgery and existing weakness prior to surgery, recommend initiating BUE theraband strengthening HEP.   PERFORMANCE DEFICITS: in functional skills including ADLs, IADLs, coordination, Fine motor control, and UE functional use.   IMPAIRMENTS: are limiting patient from ADLs, IADLs, and leisure.   CO-MORBIDITIES: may have co-morbidities  that affects occupational performance. Patient will benefit from skilled OT to address above impairments and improve overall function.  REHAB POTENTIAL: Good  PLAN:  OT FREQUENCY: 1x/week  OT DURATION: 6 weeks (anticipate early d/c)  PLANNED INTERVENTIONS: self care/ADL training, therapeutic exercise, therapeutic activity, patient/family education, DME and/or AE instructions, and Re-evaluation  RECOMMENDED OTHER SERVICES: none at this time  CONSULTED AND AGREED WITH PLAN OF CARE: Patient  PLAN FOR NEXT SESSION: BUE strengthening   Delana Meyer, OT 02/20/2023, 12:42 PM

## 2023-02-22 ENCOUNTER — Telehealth: Payer: Self-pay | Admitting: Neurology

## 2023-02-22 ENCOUNTER — Ambulatory Visit
Admission: RE | Admit: 2023-02-22 | Discharge: 2023-02-22 | Disposition: A | Discharge status: Home / Self Care | Payer: PPO | Source: Ambulatory Visit | Attending: Family Medicine | Admitting: Family Medicine

## 2023-02-22 ENCOUNTER — Encounter: Payer: PPO | Admitting: Neurology

## 2023-02-22 ENCOUNTER — Ambulatory Visit (INDEPENDENT_AMBULATORY_CARE_PROVIDER_SITE_OTHER): Payer: PPO | Admitting: Neurology

## 2023-02-22 ENCOUNTER — Encounter: Payer: Self-pay | Admitting: Neurology

## 2023-02-22 VITALS — BP 109/50 | HR 72 | Ht 70.0 in | Wt 150.0 lb

## 2023-02-22 DIAGNOSIS — Z0289 Encounter for other administrative examinations: Secondary | ICD-10-CM

## 2023-02-22 DIAGNOSIS — I700 Atherosclerosis of aorta: Secondary | ICD-10-CM | POA: Diagnosis not present

## 2023-02-22 DIAGNOSIS — G250 Essential tremor: Secondary | ICD-10-CM

## 2023-02-22 DIAGNOSIS — K5900 Constipation, unspecified: Secondary | ICD-10-CM | POA: Diagnosis not present

## 2023-02-22 DIAGNOSIS — R2689 Other abnormalities of gait and mobility: Secondary | ICD-10-CM | POA: Diagnosis not present

## 2023-02-22 DIAGNOSIS — R202 Paresthesia of skin: Secondary | ICD-10-CM

## 2023-02-22 DIAGNOSIS — D3502 Benign neoplasm of left adrenal gland: Secondary | ICD-10-CM | POA: Diagnosis not present

## 2023-02-22 DIAGNOSIS — R269 Unspecified abnormalities of gait and mobility: Secondary | ICD-10-CM

## 2023-02-22 DIAGNOSIS — J984 Other disorders of lung: Secondary | ICD-10-CM | POA: Diagnosis not present

## 2023-02-22 DIAGNOSIS — R634 Abnormal weight loss: Secondary | ICD-10-CM

## 2023-02-22 DIAGNOSIS — R109 Unspecified abdominal pain: Secondary | ICD-10-CM

## 2023-02-22 MED ORDER — ALPRAZOLAM 0.5 MG PO TABS: ORAL_TABLET | ORAL | 0 refills | Status: AC

## 2023-02-22 MED ORDER — IOPAMIDOL (ISOVUE-300) INJECTION 61%
200.0000 mL | Freq: Once | INTRAVENOUS | Status: AC | PRN
  Administered 2023-02-22: 100 mL via INTRAVENOUS

## 2023-02-22 NOTE — Procedures (Signed)
Full Name: Luis Lindsey Gender: Male MRN #: [id number] Date of Birth: [birth year]    Visit Date: 02/22/2023 08:15 Age: 72 Years Examining Physician: Dr. Levert Feinstein Referring Physician: Dr. Levert Feinstein Height: 5 feet 10 inch History: 72 year old male with a long history of poorly controlled diabetes, prior decompression surgery for left lumbar radiculopathy complains of worsening gait abnormality  Summary of the test: Nerve conduction study: Bilateral sural, superficial peroneal sensory responses were absent. Bilateral peroneal to EDB and left tibial motor responses were absent. Right tibial motor responses showed severely decreased CMAP amplitude, with moderate slowed conduction velocity.  Right ulnar sensory response showed mildly prolonged peak latency within normal range snap amplitude.  Right median motor response showed moderately prolonged peak latency with moderately decreased snap amplitude.  Right radial sensory response showed mildly prolonged peak latency with normal range snap amplitude.  Right median and ulnar motor responses showed mild to moderately prolonged peak latency, normal snap amplitude with mild slow conduction velocity.  Electromyography: Selected needle examinations were performed at bilateral lower extremity muscles and bilateral lumbosacral paraspinal muscles.  There was evidence of chronic neuropathic changes involving bilateral lower extremity muscles, left worse than right, mainly involving Lumbar 4 5 S1 myotomes.  There was increased insertional activity at bilateral lumbosacral paraspinal muscles along well-healed surgical scar, enlarged polyphasic motor unit potential, there is no active denervation's.  Conclusion: This is an abnormal study.  There is electrodiagnostic evidence of length-dependent severe axonal sensorimotor polyneuropathy.  In addition, there is evidence of chronic bilateral lumbosacral radiculopathy, mainly involving bilateral L4-5  S1 myotomes, left worse than right.    ------------------------------- Levert Feinstein, M.D. Ph.D.  [facility] [address] [place], [place] [address] Tel: [phone] Fax: [phone]  Verbal informed consent was obtained from the patient, patient was informed of potential risk of procedure, including bruising, bleeding, hematoma formation, infection, muscle weakness, muscle pain, numbness, among others.        MNC    Nerve / Sites Muscle Latency Ref. Amplitude Ref. Rel Amp Segments Distance Velocity Ref. Area    ms ms mV mV %  cm m/s m/s mVms  R Median - APB     Wrist APB 4.5 <=4.4 7.1 >=4.0 100 Wrist - APB 7   20.5     Upper arm APB 10.9  6.5  92.7 Upper arm - Wrist 28.6 45 >=49 21.2  R Ulnar - ADM     Wrist ADM 4.1 <=3.3 7.7 >=6.0 100 Wrist - ADM 7   27.6     B.Elbow ADM 7.7  6.4  82.7 B.Elbow - Wrist 16 45 >=49 23.0     A.Elbow ADM 11.6  6.2  97 A.Elbow - B.Elbow 15 38 >=49 22.0  L Peroneal - EDB     Ankle EDB NR <=6.5 NR >=2.0 NR Ankle - EDB 9   NR         Pop fossa - Ankle      R Peroneal - EDB     Ankle EDB NR <=6.5 NR >=2.0 NR Ankle - EDB 9   NR         Pop fossa - Ankle      L Tibial - AH     Ankle AH NR <=5.8 NR >=4.0 NR Ankle - AH 9   NR  R Tibial - AH     Ankle AH 5.6 <=5.8 1.4 >=4.0 100 Ankle - AH 9   5.0  Pop fossa AH 17.8  1.2  84.9 Pop fossa - Ankle 40 33 >=41 4.1                 SNC    Nerve / Sites Rec. Site Peak Lat Ref.  Amp Ref. Segments Distance    ms ms V V  cm  R Radial - Anatomical snuff box (Forearm)     Forearm Wrist 3.3 <=2.9 15 >=15 Forearm - Wrist 10  L Sural - Ankle (Calf)     Calf Ankle NR <=4.4 NR >=6 Calf - Ankle 14  R Sural - Ankle (Calf)     Calf Ankle NR <=4.4 NR >=6 Calf - Ankle 14  L Superficial peroneal - Ankle     Lat leg Ankle NR <=4.4 NR >=6 Lat leg - Ankle 14  R Superficial peroneal - Ankle     Lat leg Ankle NR <=4.4 NR >=6 Lat leg - Ankle 14  R Median - Orthodromic (Dig II, Mid palm)      Dig II Wrist 4.5 <=3.4 4 >=10 Dig II - Wrist 13  R Ulnar - Orthodromic, (Dig V, Mid palm)     Dig V Wrist 3.5 <=3.1 4 >=5 Dig V - Wrist 29                   F  Wave    Nerve F Lat Ref.   ms ms  R Tibial - AH 58.8 <=56.0  R Ulnar - ADM 36.1 <=32.0         EMG Summary Table    Spontaneous MUAP Recruitment  Muscle IA Fib PSW Fasc Other Amp Dur. Poly Pattern  R. Tibialis anterior Increased None None None _______ Normal Normal Normal Reduced  R. Tibialis posterior Normal None None None _______ Normal Normal Normal Reduced  R. Peroneus longus Normal None None None _______ Normal Normal Normal Reduced  R. Gastrocnemius (Medial head) Normal None None None _______ Normal Normal Normal Reduced  R. Vastus lateralis Normal None None None _______ Normal Normal Normal Reduced  L. Tibialis anterior Increased None None None _______ Increased Increased 1+ Reduced  L. Tibialis posterior Normal None None None _______ Increased Increased Normal Reduced  L. Peroneus longus Increased 1+ None None _______ Normal Normal Normal Reduced  L. Gastrocnemius (Medial head) Normal None None None _______ Normal Normal Normal Reduced  L. Vastus lateralis Normal None None None _______ Normal Normal Normal Reduced  L. Biceps femoris (long head) Normal None None None _______ Normal Normal Normal Normal  R. Biceps femoris (long head) Normal None None None _______ Normal Normal Normal Normal  R. Lumbar paraspinals (low) Normal None None None _______ Increased Increased 1+ Reduced  R. Lumbar paraspinals (mid) Normal None None None _______ Increased Increased 1+ Reduced  L. Lumbar paraspinals (low) Normal None None None _______ Increased Increased 1+ Reduced  L. Lumbar paraspinals (mid) Normal None None None _______ Increased Increased 1+ Reduced

## 2023-02-22 NOTE — Telephone Encounter (Signed)
[employer] NPR sent to [facility] [phone]
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
## 2023-02-26 ENCOUNTER — Ambulatory Visit: Payer: PPO | Attending: Neurology | Admitting: Occupational Therapy

## 2023-02-26 ENCOUNTER — Other Ambulatory Visit: Payer: Self-pay

## 2023-02-26 DIAGNOSIS — I498 Other specified cardiac arrhythmias: Secondary | ICD-10-CM

## 2023-02-26 DIAGNOSIS — R278 Other lack of coordination: Secondary | ICD-10-CM | POA: Diagnosis not present

## 2023-02-26 DIAGNOSIS — I25118 Atherosclerotic heart disease of native coronary artery with other forms of angina pectoris: Secondary | ICD-10-CM

## 2023-02-26 DIAGNOSIS — R251 Tremor, unspecified: Secondary | ICD-10-CM | POA: Diagnosis not present

## 2023-02-26 DIAGNOSIS — R2689 Other abnormalities of gait and mobility: Secondary | ICD-10-CM | POA: Insufficient documentation

## 2023-02-26 DIAGNOSIS — Z952 Presence of prosthetic heart valve: Secondary | ICD-10-CM

## 2023-02-26 DIAGNOSIS — R2681 Unsteadiness on feet: Secondary | ICD-10-CM | POA: Insufficient documentation

## 2023-02-26 DIAGNOSIS — I10 Essential (primary) hypertension: Secondary | ICD-10-CM

## 2023-02-26 DIAGNOSIS — R202 Paresthesia of skin: Secondary | ICD-10-CM | POA: Insufficient documentation

## 2023-02-26 DIAGNOSIS — I739 Peripheral vascular disease, unspecified: Secondary | ICD-10-CM

## 2023-02-26 DIAGNOSIS — M6281 Muscle weakness (generalized): Secondary | ICD-10-CM | POA: Insufficient documentation

## 2023-02-26 DIAGNOSIS — I77819 Aortic ectasia, unspecified site: Secondary | ICD-10-CM

## 2023-02-26 MED ORDER — AMLODIPINE BESYLATE 5 MG PO TABS: 5.0000 mg | ORAL_TABLET | Freq: Every day | ORAL | 2 refills | Status: DC

## 2023-02-26 NOTE — Patient Instructions (Signed)
 Upper Body Strengthening Exercises  Comments  Sit upright, away from the back of your chair. Keep abdominal muscles engaged i.e. pull belly button to spine.  FOR ALL EXERCISES: Repeat 10 Times  Hold 3 Seconds  Complete 1 Set  Perform 2 Times a Day   ELASTIC BAND FLEXION   Place unaffected arm on your leg or hip. With your affected arm, hold elastic band in front of you and pull the band upward towards the ceiling as shown.     ELASTIC BAND HORIZONTAL ABDUCTION - SCAPULAR RETRACTION  Start by holding elastic band in front of your chest with your elbows straight. Then, pull your arms apart and towards the side while squeezing your shoulder blades together. Return to starting position and repeat.            ELASTIC BAND TRICEPS EXTENSION  While seated, hold and fixate one end of an elastic band against your chest. Hold the other end with your opposite hand with your elbow bent and arm by your side.   Start by pulling the band downward so that the elbow goes from a bent position to a straightened position as shown. Return to starting position and repeat.   BICEPS CURL WITH BAND  While sitting in an upright position, put an elastic band underneath your feet (as pictured)  OR underneath your thighs. Grip each end of the band, keep the elbows tucked to the body, and bring hands to shoulders by bending at the elbows.

## 2023-02-26 NOTE — Therapy (Signed)
OUTPATIENT OCCUPATIONAL THERAPY NEURO TREATMENT  Patient Name: Luis Lindsey MRN: [id number] DOB:02-28-1951, 72 y.o., male Today's Date: 02/26/2023  OCCUPATIONAL THERAPY DISCHARGE SUMMARY  Visits from Start of Care: 4  Current functional level related to goals / functional outcomes: Patient has met 2/2 short-term goals and 2/2 long-term goals to date.   Remaining deficits: Pt still impacted by RUE tremor.   Education / Equipment: Continue with HEP and tremor reduction strategies    Patient agrees to discharge. Patient goals were met. Patient is being discharged due to meeting the stated rehab goals.[name]  PCP: Irven Coe, MD  REFERRING PROVIDER: Levert Feinstein, MD  END OF SESSION:  OT End of Session - 02/26/23 1233     Visit Number 4    Number of Visits 7    Date for OT Re-Evaluation 03/08/23    Authorization Type [employer] - [place], no auth req.    Progress Note Due on Visit 7    OT Start Time 1236    OT Stop Time 1304    OT Time Calculation (min) 28 min    Activity Tolerance Patient tolerated treatment well    Behavior During Therapy WFL for tasks assessed/performed             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   Dysphagia    Esophageal dysmotility    GERD (gastroesophageal reflux disease)    Heart murmur    pt had recent echocardiogram   History of hiatal hernia    History of Holter monitoring 2009   History of kidney stones    HNP (herniated nucleus pulposus), lumbar    L4-5   HYPERCHOLESTEROLEMIA 11/30/2009   Hyperlipidemia    HYPERTENSION 11/30/2009   Kidney stones    Numbness and tingling    Peripheral vascular disease (HCC)    Right carotid bruit    SMOKER 11/30/2009   Stroke (HCC) 2012   URINARY CALCULUS 11/30/2009   Past Surgical History:   Procedure Laterality Date   ABDOMINAL AORTOGRAM W/LOWER EXTREMITY N/A 06/18/2018   Procedure: ABDOMINAL AORTOGRAM W/LOWER EXTREMITY;  Surgeon: Cephus Shelling, MD;  Location: [facility];  Service: Cardiovascular;  Laterality: N/A;   ABDOMINAL AORTOGRAM W/LOWER EXTREMITY N/A 12/02/2019   Procedure: ABDOMINAL AORTOGRAM W/LOWER EXTREMITY;  Surgeon: Cephus Shelling, MD;  Location: [facility];  Service: Cardiovascular;  Laterality: N/A;   ABDOMINAL AORTOGRAM W/LOWER EXTREMITY N/A 03/23/2021   Procedure: ABDOMINAL AORTOGRAM W/LOWER EXTREMITY;  Surgeon: Cephus Shelling, MD;  Location: [facility];  Service: Cardiovascular;  Laterality: N/A;   AORTIC VALVE REPLACEMENT N/A 10/25/2021   Procedure: AORTIC VALVE REPLACEMENT (AVR)USING INSPIRIS RESILIA  AORTIC VALVE;  Surgeon: Corliss Skains, MD;  Location: [facility];  Service: Open Heart Surgery;  Laterality: N/A;   arthroscopy of right shoulder Right    Jan 2019, June 2019-- done at [facility], Dr. Magnus Ivan   BACK SURGERY     CATARACT EXTRACTION W/ INTRAOCULAR LENS  IMPLANT, BILATERAL     CORONARY ARTERY BYPASS GRAFT N/A 10/25/2021   Procedure: CORONARY ARTERY BYPASS GRAFTING (CABG) X 3 USING LEFT INTERNAL MAMMARY ARTERY AND RIGHT GREATER SAPHENOUS VEIN;  Surgeon: Corliss Skains, MD;  Location: [facility];  Service: Open Heart Surgery;  Laterality: N/A;   CYSTOSCOPY     ENDARTERECTOMY Right 08/24/2019   Procedure: ENDARTERECTOMY CAROTID;  Surgeon: Cephus Shelling, MD;  Location: [facility] OR;  Service: Vascular;  Laterality: Right;   ENDARTERECTOMY FEMORAL Left 06/05/2021   Procedure: ENDARTERECTOMY FEMORAL WITH PROFUNDAPLASTY;  Surgeon: Cephus Shelling, MD;  Location: [facility] OR;  Service: Vascular;  Laterality: Left;   ENDOVEIN HARVEST OF GREATER SAPHENOUS VEIN Right 10/25/2021   Procedure: ENDOVEIN HARVEST OF GREATER SAPHENOUS VEIN;  Surgeon: Corliss Skains, MD;  Location: [facility];  Service:  Open Heart Surgery;  Laterality: Right;   FACIAL COSMETIC SURGERY  2014   FEMORAL-POPLITEAL BYPASS GRAFT Left 06/05/2021   Procedure: LEFT FEMORAL-POPLITEAL ARTERY BYPASS GRAFTING USING 6mm PROPATEN VASCULAR REMOVABLE RING GRAFT;  Surgeon: Cephus Shelling, MD;  Location: [facility];  Service: Vascular;  Laterality: Left;   FINGER SURGERY  2002   INGUINAL HERNIA REPAIR Left 02/01/2023   Procedure: LAPAROSCOPIC LEFT INGUINAL HERNIA REPAIR WITH MESH;  Surgeon: Quentin Ore, MD;  Location: [facility];  Service: General;  Laterality: Left;  90   LEFT HEART CATH AND CORONARY ANGIOGRAPHY N/A 10/05/2021   Procedure: LEFT HEART CATH AND CORONARY ANGIOGRAPHY;  Surgeon: Yvonne Kendall, MD;  Location: [facility];  Service: Cardiovascular;  Laterality: N/A;   LUMBAR LAMINECTOMY/DECOMPRESSION MICRODISCECTOMY Left 08/01/2018   Procedure: Microdiscectomy - Lumbar four-Lumbar five - left;  Surgeon: Tia Alert, MD;  Location: [facility] OR;  Service: Neurosurgery;  Laterality: Left;   LUMBAR LAMINECTOMY/DECOMPRESSION MICRODISCECTOMY Left 10/15/2018   Procedure: Re-do Microdiscectomy - left - Lumbar four-Lumbar five;  Surgeon: Tia Alert, MD;  Location: [facility] OR;  Service: Neurosurgery;  Laterality: Left;   NECK SURGERY  1986   PATCH ANGIOPLASTY Left 06/05/2021   Procedure: PATCH ANGIOPLASTY USING Livia Snellen BIOLOGIC PATCH;  Surgeon: Cephus Shelling, MD;  Location: [facility] OR;  Service: Vascular;  Laterality: Left;   PERIPHERAL VASCULAR ATHERECTOMY  12/02/2019   Procedure: PERIPHERAL VASCULAR ATHERECTOMY;  Surgeon: Cephus Shelling, MD;  [facility]: [facility];  Service: Cardiovascular;;  left SFA   PERIPHERAL VASCULAR INTERVENTION  06/18/2018   Procedure: PERIPHERAL VASCULAR INTERVENTION;  Surgeon: Cephus Shelling, MD;  Location: [facility];  Service: Cardiovascular;;  Left external iliac   ROTATOR CUFF REPAIR Left    TEE WITHOUT CARDIOVERSION N/A 10/25/2021   Procedure: TRANSESOPHAGEAL  ECHOCARDIOGRAM (TEE);  Surgeon: Corliss Skains, MD;  Location: [facility] OR;  Service: Open Heart Surgery;  Laterality: N/A;   UMBILICAL HERNIA REPAIR     Patient Active Problem List   Diagnosis Date Noted   Essential tremor 12/26/2022   Paresthesia 12/26/2022   Hypokalemia 03/01/2022   Hypoalbuminemia 03/01/2022   Abnormal LFTs 03/01/2022   Symptomatic anemia 02/27/2022   S/P CABG x 3 02/27/2022   Iron deficiency anemia 02/27/2022   Complication of surgical procedure 02/13/2022   Degeneration of lumbar intervertebral disc 02/13/2022   S/P AVR (aortic valve replacement) 10/25/2021   Coronary artery disease of native artery of native heart with stable angina pectoris (HCC)    Abnormal cardiac CT angiography    PAD (peripheral artery disease) (HCC) 06/05/2021   S/P lumbar fusion 12/07/2020   Peripheral vascular disease (HCC) 04/06/2020   Cerebrovascular accident (CVA) (HCC) 04/06/2020   S/P carotid endarterectomy 04/06/2020   DM type 2 with diabetic peripheral neuropathy (HCC) 12/31/2019   Gait abnormality 12/31/2019   Tremor 12/07/2019   Carotid stenosis,  asymptomatic, right 08/24/2019   Carotid disease, bilateral (HCC) 08/11/2019   S/P lumbar laminectomy 08/01/2018   PVD (peripheral vascular disease) (HCC) 06/10/2018   Left lumbar radiculopathy 04/01/2018   Peripheral neuropathy 04/01/2018   Left leg claudication (HCC) 04/01/2018   Status post arthroscopy of right shoulder 06/27/2017   Superior glenoid labrum lesion of right shoulder 06/10/2017   Chronic right shoulder pain 04/16/2017   Displaced fracture of proximal phalanx of right great toe with routine healing 03/13/2017   Impingement syndrome of right shoulder 03/13/2017   Displaced fracture of proximal phalanx of right great toe, initial encounter for closed fracture 02/20/2017   Diabetes (HCC) 08/24/2013   History of Holter monitoring    Heart murmur    TIA (transient ischemic attack) 12/26/2012   Kidney stones    DM  11/30/2009   HYPERCHOLESTEROLEMIA 11/30/2009   Essential hypertension 11/30/2009   URINARY CALCULUS 11/30/2009   CAROTID BRUIT, RIGHT 11/30/2009    ONSET DATE: 01/01/2023 (date of referral) - noticed tremor in both hands a couple years ago  REFERRING DIAG: M62.81 (ICD-10-CM) - Muscle weakness (generalized) R27.8 (ICD-10-CM) - Other lack of coordination R25.1 (ICD-10-CM) - Tremor  THERAPY DIAG:  Other lack of coordination  Tremor  Muscle weakness (generalized)  Rationale for Evaluation and Treatment: Rehabilitation  SUBJECTIVE:   SUBJECTIVE STATEMENT: He was able to adjust setting on mouse with improvement noted. He notes some difficulty with self injecting his medication.  Pt accompanied by: self  PERTINENT HISTORY: PMH of PAD s/p L SFA arthrectomy with angioplasty, left CEA, HTN, HLD, moderate AS   Neuropathy: burning tingling bilateral feet (ongoing 3 yrs), worsening tremor" "shaking in hands and legs (ongoing 2 years)  PRECAUTIONS: Fall and Other: hernia-avoid heaving lifting   WEIGHT BEARING RESTRICTIONS: No  PAIN:  Are you having pain? No  FALLS: Has patient fallen in last 6 months? No  LIVING ENVIRONMENT: Lives with: lives with their spouse [name] Lives in: House/apartment Stairs: Yes: External: 1 steps; none Has following equipment at home: Environmental consultant, cane  PLOF: Independent; retired [employer] architect; driver  PATIENT GOALS: Reduce affects of tremor on daily tasks  OBJECTIVE:   HAND DOMINANCE: Right  ADLs: Overall ADLs: mod I Eating: uses spoon and bowl mostly UB Dressing: avoids buttons LB Dressing: uses slip on shoes  Equipment: Shower seat with back and Grab bars  IADLs: Shopping: mod I Light housekeeping: mod I Meal Prep: mod I  Community mobility: driving Medication management: mod I Financial management: I Handwriting:  Pt reports his handwriting is about 30% of what it was; writes in cursive  MOBILITY STATUS:  occasional bouts of LOB  requiring SBA; no AD  ACTIVITY TOLERANCE: Activity tolerance: good to fair  FUNCTIONAL OUTCOME MEASURES: PSFS: 3.7 02/26/2023   Total score = sum of the activity scores/number of activities Minimum detectable change (90%CI) for average score = 2 points Minimum detectable change (90%CI) for single activity score = 3 points   UPPER EXTREMITY ROM:    BUE: WNL  UPPER EXTREMITY MMT:     BUE: WNL  HAND FUNCTION: Grip strength: Right: 64.3 lbs; Left: 79.8 lbs  COORDINATION: 9 Hole Peg test: Right: 57 sec; Left: 32 sec (arms abducted away from body and tremor apparent on both sides) Box and Blocks:  Right 35 blocks, Left 35 blocks   SENSATION: Paresthesias reported in R finger tips  EDEMA: none reported or observed with exception to arthritic changes  MUSCLE TONE: WFL  COGNITION: Overall cognitive status: Within functional  limits for tasks assessed  VISION: Subjective report: no changes/no difficulty Baseline vision: Wears glasses for reading only Visual history: cataracts and corrective eye surgery  PERCEPTION: WFL  PRAXIS: WFL  OBSERVATIONS: 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 Appears to be well-kept.  TODAY'S TREATMENT:                                                                                                                              - Self-care/home management completed for duration as noted below including: Objective measures assessed as noted in Goals section to determine progression towards goals. Therapist reviewed goals with patient and updated patient progression.  No additional functional limitations identified. Per subjective, OT reviewed options to give injections with better management of tremor to include sitting down and using a table to stabilize hand, leaning into arm/hand, and/or use of wrist weight. - Therapeutic exercises completed for  duration as noted below including: OT initiated RUE and LUE green Thera-Band HEP including shoulder flexion, horizontal abd/add, bicep curl, and tricep extension to promote strengthening of affected extremity and overall endurance as noted in pt instructions.    PATIENT EDUCATION: Education details: Tremor reduction strategies; theraband HEP; OT d/c Person educated: Patient Education method: Explanation, Demonstration, and Handouts Education comprehension: verbalized understanding and returned demonstration  HOME EXERCISE PROGRAM: 01/30/2023: tremor reduction strategies 02/20/2023: AD for eating and use of mouse 02/26/2023: theraband HEP  GOALS:  SHORT TERM GOALS: Target date: 02/20/2023  Patient will return demonstration with use of DME as needed for tremor reduction. Baseline: Goal status: MET  2.  Pt will independently verbalize at least 2 tremor reduction strategies.  Baseline:  Goal status: MET  LONG TERM GOALS: Target date: 03/08/2023  Patient will report at least two-point increase in average PSFS score or at least three-point increase in a single activity score indicating functionally significant improvement given minimum detectable change.  Baseline: 3.7 total score (See above for individual activity scores) 02/26/2023: 7 Goal status: MET  2.  Patient will complete nine-hole peg with at least 5 second improvement with RUE. Baseline: Right: 57 sec; Left: 32 sec 02/26/2023: Right: 45 sec Goal status: MET  ASSESSMENT:  CLINICAL IMPRESSION: Pt has met all goals and demonstrates good understanding of tremor reduction strategies as needed to lessen the affects of his tremor on completion of functional activities. Patient is appropriate for discharge and no longer demonstrates medical necessity for continued skilled occupational services. PERFORMANCE DEFICITS: in functional skills including ADLs, IADLs, coordination, Fine motor control, and UE functional use.   IMPAIRMENTS:  are limiting patient from ADLs, IADLs, and leisure.   CO-MORBIDITIES: may have co-morbidities  that affects occupational performance. Patient will benefit from skilled OT to address above impairments and improve overall function.  REHAB POTENTIAL: Good  PLAN:  OT D/C Completed  Delana Meyer, OT 02/26/2023, 1:08 PM

## 2023-02-28 ENCOUNTER — Ambulatory Visit: Payer: PPO | Admitting: Physical Therapy

## 2023-02-28 DIAGNOSIS — M6281 Muscle weakness (generalized): Secondary | ICD-10-CM

## 2023-02-28 DIAGNOSIS — R2681 Unsteadiness on feet: Secondary | ICD-10-CM

## 2023-02-28 DIAGNOSIS — R251 Tremor, unspecified: Secondary | ICD-10-CM

## 2023-02-28 DIAGNOSIS — R278 Other lack of coordination: Secondary | ICD-10-CM | POA: Diagnosis not present

## 2023-02-28 DIAGNOSIS — R202 Paresthesia of skin: Secondary | ICD-10-CM

## 2023-02-28 DIAGNOSIS — R2689 Other abnormalities of gait and mobility: Secondary | ICD-10-CM

## 2023-02-28 NOTE — Therapy (Signed)
OUTPATIENT PHYSICAL THERAPY NEURO TREATMENT   Patient Name: Luis Lindsey MRN: [id number] DOB:12/15/1950, 72 y.o., male Today's Date: 02/28/2023   PCP: Irven Coe, MD REFERRING PROVIDER: Levert Feinstein, MD    END OF SESSION:  PT End of Session - 02/28/23 [phone]     Visit Number 6    Number of Visits 7   with eval   Date for PT Re-Evaluation 03/25/23   to allow for scheduling delays   Authorization Type [employer]    PT Start Time 0940   this therapist running behind   PT Stop Time 1018    PT Time Calculation (min) 38 min    Equipment Utilized During Treatment Gait belt    Activity Tolerance Patient tolerated treatment well    Behavior During Therapy WFL for tasks assessed/performed                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   Dysphagia    Esophageal dysmotility    GERD (gastroesophageal reflux disease)    Heart murmur    pt had recent echocardiogram   History of hiatal hernia    History of Holter monitoring 2009   History of kidney stones    HNP (herniated nucleus pulposus), lumbar    L4-5   HYPERCHOLESTEROLEMIA 11/30/2009   Hyperlipidemia    HYPERTENSION 11/30/2009   Kidney stones    Numbness and tingling    Peripheral vascular disease (HCC)    Right carotid bruit    SMOKER 11/30/2009   Stroke (HCC) 2012   URINARY CALCULUS 11/30/2009   Past Surgical History:  Procedure Laterality Date   ABDOMINAL AORTOGRAM W/LOWER EXTREMITY N/A 06/18/2018   Procedure: ABDOMINAL AORTOGRAM W/LOWER EXTREMITY;  Surgeon: Cephus Shelling, MD;  Location: [facility];  Service: Cardiovascular;  Laterality: N/A;   ABDOMINAL AORTOGRAM W/LOWER EXTREMITY N/A 12/02/2019   Procedure: ABDOMINAL AORTOGRAM W/LOWER EXTREMITY;  Surgeon: Cephus Shelling, MD;  Location: [facility];  Service: Cardiovascular;  Laterality: N/A;   ABDOMINAL AORTOGRAM W/LOWER EXTREMITY N/A 03/23/2021   Procedure: ABDOMINAL AORTOGRAM W/LOWER EXTREMITY;  Surgeon: Cephus Shelling, MD;  Location: [facility];  Service: Cardiovascular;  Laterality: N/A;   AORTIC VALVE REPLACEMENT N/A 10/25/2021   Procedure: AORTIC VALVE REPLACEMENT (AVR)USING INSPIRIS RESILIA  AORTIC VALVE;  Surgeon: Corliss Skains, MD;  Location: [facility];  Service: Open Heart Surgery;  Laterality: N/A;   arthroscopy of right shoulder Right    Jan 2019, June 2019-- done at [facility], Dr. Magnus Ivan   BACK SURGERY     CATARACT EXTRACTION W/ INTRAOCULAR LENS  IMPLANT, BILATERAL     CORONARY ARTERY BYPASS GRAFT N/A 10/25/2021   Procedure: CORONARY ARTERY BYPASS GRAFTING (CABG) X 3 USING LEFT INTERNAL MAMMARY ARTERY AND RIGHT GREATER SAPHENOUS VEIN;  Surgeon: Corliss Skains, MD;  Location: [facility];  Service: Open Heart Surgery;  Laterality: N/A;   CYSTOSCOPY     ENDARTERECTOMY Right 08/24/2019   Procedure: ENDARTERECTOMY CAROTID;  Surgeon: Cephus Shelling, MD;  Location: [facility] OR;  Service: Vascular;  Laterality: Right;   ENDARTERECTOMY FEMORAL Left 06/05/2021   Procedure: ENDARTERECTOMY FEMORAL WITH PROFUNDAPLASTY;  Surgeon: Cephus Shelling, MD;  Location:  [facility];  Service: Vascular;  Laterality: Left;   ENDOVEIN HARVEST OF GREATER SAPHENOUS VEIN Right 10/25/2021   Procedure: ENDOVEIN HARVEST OF GREATER SAPHENOUS VEIN;  Surgeon: Corliss Skains, MD;  Location: [facility];  Service: Open Heart Surgery;  Laterality: Right;   FACIAL COSMETIC SURGERY  2014   FEMORAL-POPLITEAL BYPASS GRAFT Left 06/05/2021   Procedure: LEFT FEMORAL-POPLITEAL ARTERY BYPASS GRAFTING USING 6mm PROPATEN VASCULAR REMOVABLE RING GRAFT;  Surgeon: Cephus Shelling, MD;  Location: [facility];  Service: Vascular;  Laterality: Left;   FINGER SURGERY  2002   INGUINAL HERNIA REPAIR Left 02/01/2023   Procedure:  LAPAROSCOPIC LEFT INGUINAL HERNIA REPAIR WITH MESH;  Surgeon: Quentin Ore, MD;  Location: [facility];  Service: General;  Laterality: Left;  90   LEFT HEART CATH AND CORONARY ANGIOGRAPHY N/A 10/05/2021   Procedure: LEFT HEART CATH AND CORONARY ANGIOGRAPHY;  Surgeon: Yvonne Kendall, MD;  [facility]: [facility];  Service: Cardiovascular;  Laterality: N/A;   LUMBAR LAMINECTOMY/DECOMPRESSION MICRODISCECTOMY Left 08/01/2018   Procedure: Microdiscectomy - Lumbar four-Lumbar five - left;  Surgeon: Tia Alert, MD;  Location: [facility];  Service: Neurosurgery;  Laterality: Left;   LUMBAR LAMINECTOMY/DECOMPRESSION MICRODISCECTOMY Left 10/15/2018   Procedure: Re-do Microdiscectomy - left - Lumbar four-Lumbar five;  Surgeon: Tia Alert, MD;  Location: [facility] OR;  Service: Neurosurgery;  Laterality: Left;   NECK SURGERY  1986   PATCH ANGIOPLASTY Left 06/05/2021   Procedure: PATCH ANGIOPLASTY USING Livia Snellen BIOLOGIC PATCH;  Surgeon: Cephus Shelling, MD;  Location: [facility] OR;  Service: Vascular;  Laterality: Left;   PERIPHERAL VASCULAR ATHERECTOMY  12/02/2019   Procedure: PERIPHERAL VASCULAR ATHERECTOMY;  Surgeon: Cephus Shelling, MD;  [facility]: [facility];  Service: Cardiovascular;;  left SFA   PERIPHERAL VASCULAR INTERVENTION  06/18/2018   Procedure: PERIPHERAL VASCULAR INTERVENTION;  Surgeon: Cephus Shelling, MD;  [facility]: [facility];  Service: Cardiovascular;;  Left external iliac   ROTATOR CUFF REPAIR Left    TEE WITHOUT CARDIOVERSION N/A 10/25/2021   Procedure: TRANSESOPHAGEAL ECHOCARDIOGRAM (TEE);  Surgeon: Corliss Skains, MD;  Location: [facility] OR;  Service: Open Heart Surgery;  Laterality: N/A;   UMBILICAL HERNIA REPAIR     Patient Active Problem List   Diagnosis Date Noted   Essential tremor 12/26/2022   Paresthesia 12/26/2022   Hypokalemia 03/01/2022   Hypoalbuminemia 03/01/2022   Abnormal LFTs 03/01/2022   Symptomatic anemia 02/27/2022   S/P CABG x 3 02/27/2022    Iron deficiency anemia 02/27/2022   Complication of surgical procedure 02/13/2022   Degeneration of lumbar intervertebral disc 02/13/2022   S/P AVR (aortic valve replacement) 10/25/2021   Coronary artery disease of native artery of native heart with stable angina pectoris (HCC)    Abnormal cardiac CT angiography    PAD (peripheral artery disease) (HCC) 06/05/2021   S/P lumbar fusion 12/07/2020   Peripheral vascular disease (HCC) 04/06/2020   Cerebrovascular accident (CVA) (HCC) 04/06/2020   S/P carotid endarterectomy 04/06/2020   DM type 2 with diabetic peripheral neuropathy (HCC) 12/31/2019   Gait abnormality 12/31/2019   Tremor 12/07/2019   Carotid stenosis, asymptomatic, right 08/24/2019   Carotid disease, bilateral (HCC) 08/11/2019   S/P lumbar laminectomy 08/01/2018   PVD (peripheral vascular disease) (HCC) 06/10/2018   Left lumbar radiculopathy 04/01/2018   Peripheral neuropathy 04/01/2018   Left leg claudication (HCC) 04/01/2018   Status post arthroscopy of right shoulder 06/27/2017   Superior glenoid labrum lesion of right shoulder 06/10/2017  Chronic right shoulder pain 04/16/2017   Displaced fracture of proximal phalanx of right great toe with routine healing 03/13/2017   Impingement syndrome of right shoulder 03/13/2017   Displaced fracture of proximal phalanx of right great toe, initial encounter for closed fracture 02/20/2017   Diabetes (HCC) 08/24/2013   History of Holter monitoring    Heart murmur    TIA (transient ischemic attack) 12/26/2012   Kidney stones    DM 11/30/2009   HYPERCHOLESTEROLEMIA 11/30/2009   Essential hypertension 11/30/2009   URINARY CALCULUS 11/30/2009   CAROTID BRUIT, RIGHT 11/30/2009    ONSET DATE: 12/26/2022 (referral date)  REFERRING DIAG: G25.0 (ICD-10-CM) - Essential tremor R20.2 (ICD-10-CM) - Paresthesia  THERAPY DIAG:  Tremor  Muscle weakness (generalized)  Other abnormalities of gait and mobility  Unsteadiness on  feet  Paresthesia of skin  Rationale for Evaluation and Treatment: Rehabilitation  SUBJECTIVE:                                                                                                                                                                                             SUBJECTIVE STATEMENT: Pt reports he is starting to feel better after his surgery, was 4 weeks ago. Pt reports his numbness in feet about the same. Pt reports that his balance was worse initially after the surgery but it is starting to get better.  Pt is working on his HEP, has gotten better/easier over the past few weeks.  Pt accompanied by: self  PERTINENT HISTORY:  PMH of PAD s/p L SFA arthrectomy with angioplasty, left CEA, HTN, HLD, moderate AS  Neuropathy: burning tingling bilateral feet (ongoing 3 yrs), worsening tremor" "shaking in hands and legs (ongoing 2 years)  PAIN:  Are you having pain? Yes: NPRS scale: 2-3/10 Pain location: hernia Pain description: aching Aggravating factors: N/A Relieving factors: N/A  There were no vitals filed for this visit.       PRECAUTIONS: Fall and Other: hernia-avoid heaving lifting  RED FLAGS: Bowel or bladder incontinence: No   WEIGHT BEARING RESTRICTIONS: No  FALLS: Has patient fallen in last 6 months? No  LIVING ENVIRONMENT: Lives with: lives with their spouse [name] Lives in: House/apartment Stairs: Yes: External: 1 steps; none Has following equipment at home: None  PLOF: Independent with gait and Independent with transfers  PATIENT GOALS: "I'm not sure"  OBJECTIVE:   DIAGNOSTIC FINDINGS:  Nerve conduction study and EMG scheduled at [facility] 02/22/23  COGNITION: Overall cognitive status: Within functional limits for tasks assessed   SENSATION: Impaired in BLE (distal RLE, knee down in LLE)  COORDINATION: WFL in BUE and BLE   LOWER  EXTREMITY MMT:    MMT Right Eval Left Eval  Hip flexion 5 3  Hip extension    Hip abduction     Hip adduction    Hip internal rotation    Hip external rotation    Knee flexion 5 3  Knee extension 5 3  Ankle dorsiflexion 4 3  Ankle plantarflexion    Ankle inversion    Ankle eversion    (Blank rows = not tested)  BED MOBILITY:  Mod I per pt report  TRANSFERS: Assistive device utilized: None  Sit to stand: Complete Independence Stand to sit: Complete Independence Chair to chair: Complete Independence Floor:  not assessed at initial eval  STAIRS: Level of Assistance: SBA Stair Negotiation Technique: Alternating Pattern  with Single Rail on Right Number of Stairs: 6  Height of Stairs: 4  Comments: WFL  GAIT: Gait pattern: decreased hip/knee flexion- Left and decreased ankle dorsiflexion- Left Distance walked: various clinic distances Assistive device utilized: None Level of assistance: Modified independence Comments: to be further assessed in functional context   TODAY'S TREATMENT:                                                                                                                               [employer] Pt brings in TENS unit that he has purchased since last session, interested in receiving assistance with setting it up so that he can safely use it home for management of his neuropathic pain in B feet. Assisted pt with setting up device at appropriate settings to treat his neuropathy, square electrodes placed on dorsum of L foot. However, with various trials and setting adjustments pt unable to feel any stimulation. Due to time constraints unable to get device to work successfully this session, will schedule one more session to trial prior to d/c.  PATIENT EDUCATION: Education details: continue HEP, PT POC with plan to try out TENs machine again next session Person educated: Patient Education method: Explanation, Demonstration, and Verbal cues Education comprehension: verbalized understanding and needs further education  HOME EXERCISE PROGRAM: Access Code:  [email or web address] URL: [email or web address] Date: 01/09/2023 Prepared by: Sherlie Ban  Exercises - Tandem Walking with Counter Support  - 1-2 x daily - 5 x weekly - 3 sets - Tandem Stance  - 1-2 x daily - 5 x weekly - 3 sets - 20-30 hold  - Side Stepping with Resistance at Ankles and Counter Support  - 1 x daily - 7 x weekly - 3 sets - 10 reps - Heel Toe Raises with Counter Support  - 1 x daily - 7 x weekly - 3 sets - 10 reps   GOALS: Goals reviewed with patient? Yes  SHORT TERM GOALS: Target date: 01/21/2023  Pt will be independent with initial HEP for improved strength, balance, transfers and gait. Baseline:  IND (8/28) Goal status: MET  2.  Pt will improve FGA to 21/30 for decreased fall risk  Baseline: 17/30 (8/5); 22/30 (  8/28) Goal status: MET   LONG TERM GOALS: Target date: 02/11/2023  Pt will be independent with final HEP for improved strength, balance, transfers and gait. Baseline:  Goal status: INITIAL  2.  Pt will improve 5 x STS to less than or equal to 15 seconds to demonstrate improved functional strength and transfer efficiency.  Baseline: 18.31 sec with BUE support (8/5) Goal status: INITIAL  3.  Pt will improve FGA to 25/30 for decreased fall risk  Baseline: 17/30 (8/5) Goal status: INITIAL   ASSESSMENT:  CLINICAL IMPRESSION: Emphasis of skilled PT session on attempting to set up patient's personal TENs unit. However, despite multiple attempts to adjust settings on device pt unable to feel any stimulation. Due to time constraints unable to continue problem solving, will schedule one more session in attempt to get device to work for patient. Continue POC.    OBJECTIVE IMPAIRMENTS: Abnormal gait, decreased balance, decreased knowledge of condition, decreased strength, impaired sensation, impaired UE functional use, and pain.   ACTIVITY LIMITATIONS: carrying, lifting, and stairs  PARTICIPATION LIMITATIONS: meal prep, cleaning, driving, and  community activity  PERSONAL FACTORS: Age, Time since onset of injury/illness/exacerbation, and 1-2 comorbidities:    PAD s/p L SFA arthrectomy with angioplasty, left CEA, HTN, HLD, moderate ASare also affecting patient's functional outcome.   REHAB POTENTIAL: Fair time since onset, diabetic peripheral neuropathy and/or history of lumbar radiculopathy s/p lumbar decompression  CLINICAL DECISION MAKING: Stable/uncomplicated  EVALUATION COMPLEXITY: Low  PLAN:  PT FREQUENCY: 1x/week  PT DURATION: 6 weeks  PLANNED INTERVENTIONS: Therapeutic exercises, Therapeutic activity, Neuromuscular re-education, Balance training, Gait training, Patient/Family education, Self Care, Joint mobilization, Orthotic/Fit training, DME instructions, Aquatic Therapy, Dry Needling, Electrical stimulation, Cryotherapy, Moist heat, Taping, Manual therapy, and Re-evaluation  PLAN FOR NEXT SESSION: TENs, assess LTG and d/c    Peter Congo, PT, DPT, CSRS   02/28/23 10:19 AM

## 2023-03-05 ENCOUNTER — Encounter: Payer: PPO | Admitting: Occupational Therapy

## 2023-03-11 ENCOUNTER — Ambulatory Visit: Payer: PPO | Admitting: Physical Therapy

## 2023-03-11 DIAGNOSIS — R2689 Other abnormalities of gait and mobility: Secondary | ICD-10-CM

## 2023-03-11 DIAGNOSIS — M6281 Muscle weakness (generalized): Secondary | ICD-10-CM

## 2023-03-11 DIAGNOSIS — R278 Other lack of coordination: Secondary | ICD-10-CM | POA: Diagnosis not present

## 2023-03-11 DIAGNOSIS — R2681 Unsteadiness on feet: Secondary | ICD-10-CM

## 2023-03-11 NOTE — Therapy (Signed)
OUTPATIENT PHYSICAL THERAPY NEURO TREATMENT - DISCHARGE NOTE   Patient Name: Luis Lindsey MRN: [id number] DOB:26-Oct-1950, 72 y.o., male Today's Date: 03/11/2023   PCP: Irven Coe, MD REFERRING PROVIDER: Levert Feinstein, MD   PHYSICAL THERAPY DISCHARGE SUMMARY  Visits from Start of Care: 7  Current functional level related to goals / functional outcomes: Mod I   Remaining deficits: Impaired balance/increased fall risk with ongoing neuropathy in BLE (L>R)   Education / Equipment: Handout for HEP and use of TENs unit for neuropathic pain management   Patient agrees to discharge. Patient goals were partially met. Patient is being discharged due to maximized rehab potential.        END OF SESSION:  PT End of Session - 03/11/23 0937     Visit Number 7    Number of Visits 7   with eval   Date for PT Re-Evaluation 03/25/23   to allow for scheduling delays   Authorization Type [employer]    PT Start Time 0935    PT Stop Time 1009   d/c   PT Time Calculation (min) 34 min    Equipment Utilized During Treatment Gait belt    Activity Tolerance Patient tolerated treatment well    Behavior During Therapy WFL for tasks assessed/performed                 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   Dysphagia    Esophageal dysmotility    GERD (gastroesophageal reflux disease)    Heart murmur    pt had recent echocardiogram   History of hiatal hernia    History of Holter monitoring 2009   History of kidney stones    HNP (herniated nucleus pulposus), lumbar    L4-5   HYPERCHOLESTEROLEMIA 11/30/2009   Hyperlipidemia    HYPERTENSION 11/30/2009   Kidney stones    Numbness and tingling    Peripheral vascular disease (HCC)    Right carotid bruit    SMOKER 11/30/2009   Stroke  (HCC) 2012   URINARY CALCULUS 11/30/2009   Past Surgical History:  Procedure Laterality Date   ABDOMINAL AORTOGRAM W/LOWER EXTREMITY N/A 06/18/2018   Procedure: ABDOMINAL AORTOGRAM W/LOWER EXTREMITY;  Surgeon: Cephus Shelling, MD;  [facility]: [facility];  Service: Cardiovascular;  Laterality: N/A;   ABDOMINAL AORTOGRAM W/LOWER EXTREMITY N/A 12/02/2019   Procedure: ABDOMINAL AORTOGRAM W/LOWER EXTREMITY;  Surgeon: Cephus Shelling, MD;  Location: [facility];  Service: Cardiovascular;  Laterality: N/A;   ABDOMINAL AORTOGRAM W/LOWER EXTREMITY N/A 03/23/2021   Procedure: ABDOMINAL AORTOGRAM W/LOWER EXTREMITY;  Surgeon: Cephus Shelling, MD;  [facility]: [facility];  Service: Cardiovascular;  Laterality: N/A;   AORTIC VALVE REPLACEMENT N/A 10/25/2021   Procedure: AORTIC VALVE REPLACEMENT (AVR)USING INSPIRIS RESILIA  AORTIC VALVE;  Surgeon: Corliss Skains, MD;  Location: [facility];  Service: Open Heart Surgery;  Laterality: N/A;   arthroscopy of right shoulder Right    Jan 2019, June 2019-- done at [facility], Dr. Magnus Ivan   BACK SURGERY     CATARACT EXTRACTION W/ INTRAOCULAR LENS  IMPLANT, BILATERAL     CORONARY ARTERY BYPASS GRAFT N/A 10/25/2021   Procedure: CORONARY ARTERY BYPASS GRAFTING (CABG) X  3 USING LEFT INTERNAL MAMMARY ARTERY AND RIGHT GREATER SAPHENOUS VEIN;  Surgeon: Corliss Skains, MD;  Location: [facility];  Service: Open Heart Surgery;  Laterality: N/A;   CYSTOSCOPY     ENDARTERECTOMY Right 08/24/2019   Procedure: ENDARTERECTOMY CAROTID;  Surgeon: Cephus Shelling, MD;  Location: [facility] OR;  Service: Vascular;  Laterality: Right;   ENDARTERECTOMY FEMORAL Left 06/05/2021   Procedure: ENDARTERECTOMY FEMORAL WITH PROFUNDAPLASTY;  Surgeon: Cephus Shelling, MD;  Location: [facility] OR;  Service: Vascular;  Laterality: Left;   ENDOVEIN HARVEST OF GREATER SAPHENOUS VEIN Right 10/25/2021   Procedure: ENDOVEIN HARVEST OF GREATER SAPHENOUS  VEIN;  Surgeon: Corliss Skains, MD;  Location: [facility];  Service: Open Heart Surgery;  Laterality: Right;   FACIAL COSMETIC SURGERY  2014   FEMORAL-POPLITEAL BYPASS GRAFT Left 06/05/2021   Procedure: LEFT FEMORAL-POPLITEAL ARTERY BYPASS GRAFTING USING 6mm PROPATEN VASCULAR REMOVABLE RING GRAFT;  Surgeon: Cephus Shelling, MD;  Location: [facility];  Service: Vascular;  Laterality: Left;   FINGER SURGERY  2002   INGUINAL HERNIA REPAIR Left 02/01/2023   Procedure: LAPAROSCOPIC LEFT INGUINAL HERNIA REPAIR WITH MESH;  Surgeon: Quentin Ore, MD;  Location: [facility];  Service: General;  Laterality: Left;  90   LEFT HEART CATH AND CORONARY ANGIOGRAPHY N/A 10/05/2021   Procedure: LEFT HEART CATH AND CORONARY ANGIOGRAPHY;  Surgeon: Yvonne Kendall, MD;  [facility];  Service: Cardiovascular;  Laterality: N/A;   LUMBAR LAMINECTOMY/DECOMPRESSION MICRODISCECTOMY Left 08/01/2018   Procedure: Microdiscectomy - Lumbar four-Lumbar five - left;  Surgeon: Tia Alert, MD;  Location: [facility] OR;  Service: Neurosurgery;  Laterality: Left;   LUMBAR LAMINECTOMY/DECOMPRESSION MICRODISCECTOMY Left 10/15/2018   Procedure: Re-do Microdiscectomy - left - Lumbar four-Lumbar five;  Surgeon: Tia Alert, MD;  Location: [facility] OR;  Service: Neurosurgery;  Laterality: Left;   NECK SURGERY  1986   PATCH ANGIOPLASTY Left 06/05/2021   Procedure: PATCH ANGIOPLASTY USING Livia Snellen BIOLOGIC PATCH;  Surgeon: Cephus Shelling, MD;  Location: [facility] OR;  Service: Vascular;  Laterality: Left;   PERIPHERAL VASCULAR ATHERECTOMY  12/02/2019   Procedure: PERIPHERAL VASCULAR ATHERECTOMY;  Surgeon: Cephus Shelling, MD;  [facility];  Service: Cardiovascular;;  left SFA   PERIPHERAL VASCULAR INTERVENTION  06/18/2018   Procedure: PERIPHERAL VASCULAR INTERVENTION;  Surgeon: Cephus Shelling, MD;  [facility];  Service: Cardiovascular;;  Left external iliac   ROTATOR CUFF REPAIR Left    TEE  WITHOUT CARDIOVERSION N/A 10/25/2021   Procedure: TRANSESOPHAGEAL ECHOCARDIOGRAM (TEE);  Surgeon: Corliss Skains, MD;  Location: [facility] OR;  Service: Open Heart Surgery;  Laterality: N/A;   UMBILICAL HERNIA REPAIR     Patient Active Problem List   Diagnosis Date Noted   Essential tremor 12/26/2022   Paresthesia 12/26/2022   Hypokalemia 03/01/2022   Hypoalbuminemia 03/01/2022   Abnormal LFTs 03/01/2022   Symptomatic anemia 02/27/2022   S/P CABG x 3 02/27/2022   Iron deficiency anemia 02/27/2022   Complication of surgical procedure 02/13/2022   Degeneration of lumbar intervertebral disc 02/13/2022   S/P AVR (aortic valve replacement) 10/25/2021   Coronary artery disease of native artery of native heart with stable angina pectoris (HCC)    Abnormal cardiac CT angiography    PAD (peripheral artery disease) (HCC) 06/05/2021   S/P lumbar fusion 12/07/2020   Peripheral vascular disease (HCC) 04/06/2020   Cerebrovascular accident (CVA) (HCC) 04/06/2020   S/P carotid endarterectomy 04/06/2020   DM type 2 with  diabetic peripheral neuropathy (HCC) 12/31/2019   Gait abnormality 12/31/2019   Tremor 12/07/2019   Carotid stenosis, asymptomatic, right 08/24/2019   Carotid disease, bilateral (HCC) 08/11/2019   S/P lumbar laminectomy 08/01/2018   PVD (peripheral vascular disease) (HCC) 06/10/2018   Left lumbar radiculopathy 04/01/2018   Peripheral neuropathy 04/01/2018   Left leg claudication (HCC) 04/01/2018   Status post arthroscopy of right shoulder 06/27/2017   Superior glenoid labrum lesion of right shoulder 06/10/2017   Chronic right shoulder pain 04/16/2017   Displaced fracture of proximal phalanx of right great toe with routine healing 03/13/2017   Impingement syndrome of right shoulder 03/13/2017   Displaced fracture of proximal phalanx of right great toe, initial encounter for closed fracture 02/20/2017   Diabetes (HCC) 08/24/2013   History of Holter monitoring    Heart murmur     TIA (transient ischemic attack) 12/26/2012   Kidney stones    DM 11/30/2009   HYPERCHOLESTEROLEMIA 11/30/2009   Essential hypertension 11/30/2009   URINARY CALCULUS 11/30/2009   CAROTID BRUIT, RIGHT 11/30/2009    ONSET DATE: 12/26/2022 (referral date)  REFERRING DIAG: G25.0 (ICD-10-CM) - Essential tremor R20.2 (ICD-10-CM) - Paresthesia  THERAPY DIAG:  Muscle weakness (generalized)  Other abnormalities of gait and mobility  Unsteadiness on feet  Rationale for Evaluation and Treatment: Rehabilitation  SUBJECTIVE:                                                                                                                                                                                             SUBJECTIVE STATEMENT: Pt denies any acute changes since last visit. Pt agreeable to d/c this date after reviewing use of his TENs unit.  Pt accompanied by: self  PERTINENT HISTORY:  PMH of PAD s/p L SFA arthrectomy with angioplasty, left CEA, HTN, HLD, moderate AS  Neuropathy: burning tingling bilateral feet (ongoing 3 yrs), worsening tremor" "shaking in hands and legs (ongoing 2 years)  PAIN:  Are you having pain? Yes: NPRS scale: 2-3/10 Pain location: hernia Pain description: aching Aggravating factors: N/A Relieving factors: N/A  There were no vitals filed for this visit.       PRECAUTIONS: Fall and Other: hernia-avoid heaving lifting  RED FLAGS: Bowel or bladder incontinence: No   WEIGHT BEARING RESTRICTIONS: No  FALLS: Has patient fallen in last 6 months? No  LIVING ENVIRONMENT: Lives with: lives with their spouse [name] Lives in: House/apartment Stairs: Yes: External: 1 steps; none Has following equipment at home: None  PLOF: Independent with gait and Independent with transfers  PATIENT GOALS: "I'm not sure"  OBJECTIVE:   DIAGNOSTIC FINDINGS:  Nerve conduction study  and EMG scheduled at [facility] 02/22/23  COGNITION: Overall cognitive status: Within  functional limits for tasks assessed   SENSATION: Impaired in BLE (distal RLE, knee down in LLE)  COORDINATION: WFL in BUE and BLE   LOWER EXTREMITY MMT:    MMT Right Eval Left Eval  Hip flexion 5 3  Hip extension    Hip abduction    Hip adduction    Hip internal rotation    Hip external rotation    Knee flexion 5 3  Knee extension 5 3  Ankle dorsiflexion 4 3  Ankle plantarflexion    Ankle inversion    Ankle eversion    (Blank rows = not tested)  BED MOBILITY:  Mod I per pt report  TRANSFERS: Assistive device utilized: None  Sit to stand: Complete Independence Stand to sit: Complete Independence Chair to chair: Complete Independence Floor:  not assessed at initial eval  STAIRS: Level of Assistance: SBA Stair Negotiation Technique: Alternating Pattern  with Single Rail on Right Number of Stairs: 6  Height of Stairs: 4  Comments: WFL  GAIT: Gait pattern: decreased hip/knee flexion- Left and decreased ankle dorsiflexion- Left Distance walked: various clinic distances Assistive device utilized: None Level of assistance: Modified independence Comments: to be further assessed in functional context   TODAY'S TREATMENT:                                                                                                                               TherAct Pt brings in TENS unit that he has purchased for use at home, pt unable to detect any sensation in dorsum or plantar aspect of his LLE due to neuropathy. Pt is able to detect sensation in his L tib anterior muscle and on dorsum of his R foot. Settings on his TENS unit for 80 Hz rate and 200uS pulse width. Pt instructed on how to safely turn on/off unit, place electrodes, perform skin inspection, frequency of use of device, etc. Pt instructed to only use the device on the dorsum of his R foot for neuropathic pain management as he does not have enough sensation in his L foot to benefit from use of TENS on this side.  For  LTG assessment:  [facility] PT Assessment - 03/11/23 0956       Standardized Balance Assessment   Standardized Balance Assessment Five Times Sit to Stand    Five times sit to stand comments  17.71 sec   BUE on arms of chair     Functional Gait  Assessment   Gait assessed  Yes    Gait Level Surface Walks 20 ft, slow speed, abnormal gait pattern, evidence for imbalance or deviates 10-15 in outside of the 12 in walkway width. Requires more than 7 sec to ambulate 20 ft.    Change in Gait Speed Able to smoothly change walking speed without loss of balance or gait deviation. Deviate no more than 6 in outside of  the 12 in walkway width.    Gait with Horizontal Head Turns Performs head turns smoothly with slight change in gait velocity (eg, minor disruption to smooth gait path), deviates 6-10 in outside 12 in walkway width, or uses an assistive device.    Gait with Vertical Head Turns Performs task with slight change in gait velocity (eg, minor disruption to smooth gait path), deviates 6 - 10 in outside 12 in walkway width or uses assistive device    Gait and Pivot Turn Pivot turns safely in greater than 3 sec and stops with no loss of balance, or pivot turns safely within 3 sec and stops with mild imbalance, requires small steps to catch balance.    Step Over Obstacle Is able to step over one shoe box (4.5 in total height) without changing gait speed. No evidence of imbalance.    Gait with Narrow Base of Support Ambulates less than 4 steps heel to toe or cannot perform without assistance.    Gait with Eyes Closed Walks 20 ft, uses assistive device, slower speed, mild gait deviations, deviates 6-10 in outside 12 in walkway width. Ambulates 20 ft in less than 9 sec but greater than 7 sec.    Ambulating Backwards Walks 20 ft, no assistive devices, good speed, no evidence for imbalance, normal gait    Steps Alternating feet, no rail.    Total Score 20    FGA comment: 20/30, moderate fall risk               PATIENT EDUCATION: Education details: continue HEP, TENS (see above and handout provided), results of OM and functional implications with ongoing increased fall risk due to neuropathy Person educated: Patient Education method: Explanation, Demonstration, Verbal cues, and Handouts Education comprehension: verbalized understanding and returned demonstration  HOME EXERCISE PROGRAM: Access Code: [email or web address] URL: [email or web address] Date: 01/09/2023 Prepared by: Sherlie Ban  Exercises - Tandem Walking with Counter Support  - 1-2 x daily - 5 x weekly - 3 sets - Tandem Stance  - 1-2 x daily - 5 x weekly - 3 sets - 20-30 hold  - Side Stepping with Resistance at Ankles and Counter Support  - 1 x daily - 7 x weekly - 3 sets - 10 reps - Heel Toe Raises with Counter Support  - 1 x daily - 7 x weekly - 3 sets - 10 reps   GOALS: Goals reviewed with patient? Yes  SHORT TERM GOALS: Target date: 01/21/2023  Pt will be independent with initial HEP for improved strength, balance, transfers and gait. Baseline:  IND (8/28) Goal status: MET  2.  Pt will improve FGA to 21/30 for decreased fall risk  Baseline: 17/30 (8/5); 22/30 (8/28) Goal status: MET   LONG TERM GOALS: Target date: 02/11/2023  Pt will be independent with final HEP for improved strength, balance, transfers and gait. Baseline:  Goal status: MET  2.  Pt will improve 5 x STS to less than or equal to 15 seconds to demonstrate improved functional strength and transfer efficiency.  Baseline: 18.31 sec with BUE support (8/5), 17.71 sec with BUE support (10/14) Goal status: NOT MET  3.  Pt will improve FGA to 25/30 for decreased fall risk  Baseline: 17/30 (8/5), 20/30 (10/14) Goal status: NOT MET   ASSESSMENT:  CLINICAL IMPRESSION: Emphasis of skilled PT session on reassessing LTG in preparation for d/c from OPPT services as well as trialing pt's personal TENS unit device again to see if patient could  benefit from  use of TENS to manage his neuropathic pain. Pt has met 1/3 LTG due to being independent with his final HEP. He did show improvement in functional strength as well as improvement in balance from initial eval based on his scores on the 5xSTS and FGA this date though he did not improve enough to meet LTGs. Educated patient that due to his neuropathy he will have ongoing balance impairments and needs to be mindful of this with functional mobility.   OBJECTIVE IMPAIRMENTS: Abnormal gait, decreased balance, decreased knowledge of condition, decreased strength, impaired sensation, impaired UE functional use, and pain.   ACTIVITY LIMITATIONS: carrying, lifting, and stairs  PARTICIPATION LIMITATIONS: meal prep, cleaning, driving, and community activity  PERSONAL FACTORS: Age, Time since onset of injury/illness/exacerbation, and 1-2 comorbidities:    PAD s/p L SFA arthrectomy with angioplasty, left CEA, HTN, HLD, moderate ASare also affecting patient's functional outcome.   REHAB POTENTIAL: Fair time since onset, diabetic peripheral neuropathy and/or history of lumbar radiculopathy s/p lumbar decompression  CLINICAL DECISION MAKING: Stable/uncomplicated  EVALUATION COMPLEXITY: Low     Peter Congo, PT, DPT, CSRS   03/11/23 10:10 AM

## 2023-03-12 DIAGNOSIS — E1165 Type 2 diabetes mellitus with hyperglycemia: Secondary | ICD-10-CM | POA: Diagnosis not present

## 2023-03-12 DIAGNOSIS — I739 Peripheral vascular disease, unspecified: Secondary | ICD-10-CM | POA: Diagnosis not present

## 2023-03-12 DIAGNOSIS — K224 Dyskinesia of esophagus: Secondary | ICD-10-CM | POA: Diagnosis not present

## 2023-03-12 DIAGNOSIS — D509 Iron deficiency anemia, unspecified: Secondary | ICD-10-CM | POA: Diagnosis not present

## 2023-03-12 DIAGNOSIS — I10 Essential (primary) hypertension: Secondary | ICD-10-CM | POA: Diagnosis not present

## 2023-03-12 DIAGNOSIS — R809 Proteinuria, unspecified: Secondary | ICD-10-CM | POA: Diagnosis not present

## 2023-03-12 DIAGNOSIS — E782 Mixed hyperlipidemia: Secondary | ICD-10-CM | POA: Diagnosis not present

## 2023-04-06 ENCOUNTER — Ambulatory Visit
Admission: RE | Admit: 2023-04-06 | Discharge: 2023-04-06 | Disposition: A | Discharge status: Home / Self Care | Payer: PPO | Source: Ambulatory Visit | Attending: Neurology | Admitting: Neurology

## 2023-04-06 DIAGNOSIS — G250 Essential tremor: Secondary | ICD-10-CM | POA: Diagnosis not present

## 2023-04-06 DIAGNOSIS — R269 Unspecified abnormalities of gait and mobility: Secondary | ICD-10-CM | POA: Diagnosis not present

## 2023-04-06 DIAGNOSIS — R202 Paresthesia of skin: Secondary | ICD-10-CM | POA: Diagnosis not present

## 2023-04-06 MED ORDER — GADOPICLENOL 0.5 MMOL/ML IV SOLN
7.0000 mL | Freq: Once | INTRAVENOUS | Status: AC | PRN
Start: 2023-04-06 — End: 2023-04-06
  Administered 2023-04-06: 7 mL via INTRAVENOUS

## 2023-04-17 DIAGNOSIS — Z85828 Personal history of other malignant neoplasm of skin: Secondary | ICD-10-CM | POA: Diagnosis not present

## 2023-04-17 DIAGNOSIS — D0359 Melanoma in situ of other part of trunk: Secondary | ICD-10-CM | POA: Diagnosis not present

## 2023-04-17 DIAGNOSIS — L570 Actinic keratosis: Secondary | ICD-10-CM | POA: Diagnosis not present

## 2023-04-17 DIAGNOSIS — D485 Neoplasm of uncertain behavior of skin: Secondary | ICD-10-CM | POA: Diagnosis not present

## 2023-04-17 DIAGNOSIS — L578 Other skin changes due to chronic exposure to nonionizing radiation: Secondary | ICD-10-CM | POA: Diagnosis not present

## 2023-04-17 DIAGNOSIS — L814 Other melanin hyperpigmentation: Secondary | ICD-10-CM | POA: Diagnosis not present

## 2023-04-17 DIAGNOSIS — L821 Other seborrheic keratosis: Secondary | ICD-10-CM | POA: Diagnosis not present

## 2023-04-17 DIAGNOSIS — D1801 Hemangioma of skin and subcutaneous tissue: Secondary | ICD-10-CM | POA: Diagnosis not present

## 2023-04-17 DIAGNOSIS — D229 Melanocytic nevi, unspecified: Secondary | ICD-10-CM | POA: Diagnosis not present

## 2023-04-30 DIAGNOSIS — D0359 Melanoma in situ of other part of trunk: Secondary | ICD-10-CM | POA: Diagnosis not present

## 2023-05-15 DIAGNOSIS — D508 Other iron deficiency anemias: Secondary | ICD-10-CM | POA: Diagnosis not present

## 2023-06-04 ENCOUNTER — Encounter (HOSPITAL_COMMUNITY): Payer: PPO

## 2023-06-04 ENCOUNTER — Ambulatory Visit: Payer: PPO | Admitting: Vascular Surgery

## 2023-06-04 ENCOUNTER — Other Ambulatory Visit (HOSPITAL_COMMUNITY): Payer: PPO

## 2023-06-13 DIAGNOSIS — R809 Proteinuria, unspecified: Secondary | ICD-10-CM | POA: Diagnosis not present

## 2023-06-13 DIAGNOSIS — D509 Iron deficiency anemia, unspecified: Secondary | ICD-10-CM | POA: Diagnosis not present

## 2023-06-13 DIAGNOSIS — E1142 Type 2 diabetes mellitus with diabetic polyneuropathy: Secondary | ICD-10-CM | POA: Diagnosis not present

## 2023-06-13 DIAGNOSIS — I739 Peripheral vascular disease, unspecified: Secondary | ICD-10-CM | POA: Diagnosis not present

## 2023-06-13 DIAGNOSIS — E1165 Type 2 diabetes mellitus with hyperglycemia: Secondary | ICD-10-CM | POA: Diagnosis not present

## 2023-06-21 ENCOUNTER — Telehealth: Payer: Self-pay

## 2023-06-21 DIAGNOSIS — Z122 Encounter for screening for malignant neoplasm of respiratory organs: Secondary | ICD-10-CM

## 2023-06-21 DIAGNOSIS — Z87891 Personal history of nicotine dependence: Secondary | ICD-10-CM

## 2023-06-21 DIAGNOSIS — F17210 Nicotine dependence, cigarettes, uncomplicated: Secondary | ICD-10-CM

## 2023-06-21 NOTE — Telephone Encounter (Signed)
Lung Cancer Screening Narrative/Criteria Questionnaire (Cigarette Smokers Only- No Cigars/Pipes/vapes)   Luis Lindsey   SDMV:07/03/2023 at 9:15am with [name], RN   November 25, 1950   LDCT: 07/10/2023 @ 1:00pm at [facility]    73 y.o.   Phone: [phone]  Lung Screening Narrative (confirm age 36-77 yrs Medicare / 50-80 yrs Private pay insurance)   Insurance information:[employer]   Referring Provider:[facility]   This screening involves an initial phone call with a team member from our program. It is called a shared decision making visit. The initial meeting is required by  insurance and Medicare to make sure you understand the program. This appointment takes about 15-20 minutes to complete. You will complete the screening scan at your scheduled date/time.  This scan takes about 5-10 minutes to complete. You can eat and drink normally before and after the scan.  Criteria questions for Lung Cancer Screening:   Are you a current or former smoker? Former Age began smoking: 17   If you are a former smoker, what year did you quit smoking? 11/2015, also quit 1.5 years (within 15 yrs)   To calculate your smoking history, I need an accurate estimate of how many packs of cigarettes you smoked per day and for how many years. (Not just the number of PPD you are now smoking)   Years smoking 45.5 x Packs per day 1 = Pack years 45.5   (at least 20 pack yrs)   (Make sure they understand that we need to know how much they have smoked in the past, not just the number of PPD they are smoking now)  Do you have a personal history of cancer?  No    Do you have a family history of cancer? No  Are you coughing up blood?  No  Have you had unexplained weight loss of 15 lbs or more in the last 6 months? No Lost 24lbs last summer  It looks like you meet all criteria.  When would be a good time for [place] to schedule you for this screening?   Additional information:

## 2023-07-02 ENCOUNTER — Other Ambulatory Visit: Payer: Self-pay | Admitting: Vascular Surgery

## 2023-07-03 ENCOUNTER — Ambulatory Visit: Payer: Medicare Other | Admitting: Adult Health

## 2023-07-03 ENCOUNTER — Encounter: Payer: Self-pay | Admitting: Adult Health

## 2023-07-03 DIAGNOSIS — Z87891 Personal history of nicotine dependence: Secondary | ICD-10-CM

## 2023-07-03 NOTE — Patient Instructions (Signed)

## 2023-07-03 NOTE — Progress Notes (Signed)
  Virtual Visit via Telephone Note  I connected with Luis Lindsey , 07/03/23 9:20 AM by a telemedicine application and verified that I am speaking with the correct person using two identifiers.  Location: Patient: home Provider: home   I discussed the limitations of evaluation and management by telemedicine and the availability of in person appointments. The patient expressed understanding and agreed to proceed.   Shared Decision Making Visit Lung Cancer Screening Program [phone])   Eligibility: 73 y.o. Pack Years Smoking History Calculation =45 pack years (# packs/per year x # years smoked) Recent History of coughing up blood  no Unexplained weight loss? no ( >Than 15 pounds within the last 6 months ) Prior History Lung / other cancer yes - melanoma 2024 (Diagnosis within the last 5 years already requiring surveillance chest CT Scans). Smoking Status Former Smoker Former Smokers: Years since quit: 8 years  Quit Date: 2017  Visit Components: Discussion included one or more decision making aids. YES Discussion included risk/benefits of screening. YES Discussion included potential follow up diagnostic testing for abnormal scans. YES Discussion included meaning and risk of over diagnosis. YES Discussion included meaning and risk of False Positives. YES Discussion included meaning of total radiation exposure. YES  Counseling Included: Importance of adherence to annual lung cancer LDCT screening. YES Impact of comorbidities on ability to participate in the program. YES Ability and willingness to under diagnostic treatment. YES  Smoking Cessation Counseling: Former Smokers:  Discussed the importance of maintaining cigarette abstinence. yes Diagnosis Code: Personal History of Nicotine Dependence. S12.108 Information about tobacco cessation classes and interventions provided to patient. Yes Patient provided with ticket for LDCT Scan. yes Written Order for Lung Cancer  Screening with LDCT placed in Epic. Yes (CT Chest Lung Cancer Screening Low Dose W/O CM) [id number]  Z12.2-Screening of respiratory organs Z87.891-Personal history of nicotine dependence   [name] 07/03/23

## 2023-07-09 ENCOUNTER — Ambulatory Visit (INDEPENDENT_AMBULATORY_CARE_PROVIDER_SITE_OTHER)
Admission: RE | Admit: 2023-07-09 | Discharge: 2023-07-09 | Disposition: A | Discharge status: Home / Self Care | Payer: Medicare Other | Source: Ambulatory Visit | Attending: Vascular Surgery

## 2023-07-09 ENCOUNTER — Ambulatory Visit: Payer: Medicare Other | Admitting: Vascular Surgery

## 2023-07-09 ENCOUNTER — Ambulatory Visit (HOSPITAL_COMMUNITY)
Admission: RE | Admit: 2023-07-09 | Discharge: 2023-07-09 | Disposition: A | Discharge status: Home / Self Care | Payer: Medicare Other | Source: Ambulatory Visit | Attending: Vascular Surgery | Admitting: Vascular Surgery

## 2023-07-09 ENCOUNTER — Encounter: Payer: Self-pay | Admitting: Vascular Surgery

## 2023-07-09 VITALS — BP 186/70 | HR 71 | Temp 97.9°F | Resp 18 | Ht 70.0 in | Wt 162.0 lb

## 2023-07-09 DIAGNOSIS — I739 Peripheral vascular disease, unspecified: Secondary | ICD-10-CM

## 2023-07-09 DIAGNOSIS — I6523 Occlusion and stenosis of bilateral carotid arteries: Secondary | ICD-10-CM

## 2023-07-09 LAB — VAS US ABI WITH/WO TBI
Left ABI: 0.93
Right ABI: 0.58

## 2023-07-09 NOTE — Progress Notes (Signed)
Patient name: Luis Lindsey MRN: [id number] DOB: 03-27-1951 Sex: male  REASON FOR VISIT: 6 month follow-up  HPI: Luis Lindsey is a 73 y.o. male well-known to our practice that presents for 6 month follow-up for interval surveillance of his PAD.  States overall he is doing well.  Not smoking.  States he gets some cramping in his left calf if he walks several blocks with his dog but is not very limited by this.  No recent stroke or TIA symptoms given previous right carotid endarterectomy.  He has extensive history including left external iliac stenting for claudication on 06/18/2018.  Later he had left SFA stenting for claudication on 12/02/2019.  Ultimately he then had left common femoral endarterectomy with profundoplasty and bovine patch as well as left common femoral to below-knee popliteal bypass with 6 mm PTFE on 06/05/2021.    He has also had a right carotid endarterectomy on 08/24/2019.   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   Dysphagia    Esophageal dysmotility    GERD (gastroesophageal reflux disease)    Heart murmur    pt had recent echocardiogram   History of hiatal hernia    History of Holter monitoring 2009   History of kidney stones    HNP (herniated nucleus pulposus), lumbar    L4-5   HYPERCHOLESTEROLEMIA 11/30/2009   Hyperlipidemia    HYPERTENSION 11/30/2009   Kidney stones    Numbness and tingling    Peripheral vascular disease (HCC)    Right carotid bruit    SMOKER 11/30/2009   Stroke (HCC) 2012   URINARY CALCULUS 11/30/2009    Past Surgical History:  Procedure Laterality Date   ABDOMINAL AORTOGRAM W/LOWER EXTREMITY N/A 06/18/2018   Procedure: ABDOMINAL AORTOGRAM W/LOWER EXTREMITY;  Surgeon: Cephus Shelling, MD;  Location: [facility];  Service: Cardiovascular;   Laterality: N/A;   ABDOMINAL AORTOGRAM W/LOWER EXTREMITY N/A 12/02/2019   Procedure: ABDOMINAL AORTOGRAM W/LOWER EXTREMITY;  Surgeon: Cephus Shelling, MD;  Location: [facility];  Service: Cardiovascular;  Laterality: N/A;   ABDOMINAL AORTOGRAM W/LOWER EXTREMITY N/A 03/23/2021   Procedure: ABDOMINAL AORTOGRAM W/LOWER EXTREMITY;  Surgeon: Cephus Shelling, MD;  Location: [facility];  Service: Cardiovascular;  Laterality: N/A;   AORTIC VALVE REPLACEMENT N/A 10/25/2021   Procedure: AORTIC VALVE REPLACEMENT (AVR)USING INSPIRIS RESILIA  AORTIC VALVE;  Surgeon: Corliss Skains, MD;  Location: [facility];  Service: Open Heart Surgery;  Laterality: N/A;   arthroscopy of right shoulder Right    Jan 2019, June 2019-- done at [facility], Dr. Magnus Ivan   BACK SURGERY     CATARACT EXTRACTION W/ INTRAOCULAR LENS  IMPLANT, BILATERAL     CORONARY ARTERY BYPASS GRAFT N/A 10/25/2021   Procedure: CORONARY ARTERY BYPASS GRAFTING (CABG) X 3 USING LEFT INTERNAL MAMMARY ARTERY AND RIGHT GREATER SAPHENOUS VEIN;  Surgeon: Corliss Skains, MD;  Location: [facility];  Service: Open Heart Surgery;  Laterality: N/A;   CYSTOSCOPY     ENDARTERECTOMY Right 08/24/2019   Procedure: ENDARTERECTOMY CAROTID;  Surgeon: Cephus Shelling, MD;  Location: [facility] OR;  Service: Vascular;  Laterality: Right;   ENDARTERECTOMY FEMORAL Left 06/05/2021   Procedure: ENDARTERECTOMY FEMORAL WITH PROFUNDAPLASTY;  Surgeon: Cephus Shelling, MD;  Location: [facility] OR;  Service: Vascular;  Laterality: Left;   ENDOVEIN HARVEST OF GREATER SAPHENOUS VEIN Right 10/25/2021   Procedure: ENDOVEIN HARVEST OF GREATER SAPHENOUS VEIN;  Surgeon: Corliss Skains, MD;  Location: [facility];  Service: Open Heart Surgery;  Laterality: Right;   FACIAL COSMETIC SURGERY  2014   FEMORAL-POPLITEAL BYPASS GRAFT Left 06/05/2021   Procedure: LEFT FEMORAL-POPLITEAL ARTERY BYPASS GRAFTING USING 6mm PROPATEN VASCULAR REMOVABLE RING GRAFT;   Surgeon: Cephus Shelling, MD;  Location: [facility];  Service: Vascular;  Laterality: Left;   FINGER SURGERY  2002   INGUINAL HERNIA REPAIR Left 02/01/2023   Procedure: LAPAROSCOPIC LEFT INGUINAL HERNIA REPAIR WITH MESH;  Surgeon: Quentin Ore, MD;  Location: [facility];  Service: General;  Laterality: Left;  90   LEFT HEART CATH AND CORONARY ANGIOGRAPHY N/A 10/05/2021   Procedure: LEFT HEART CATH AND CORONARY ANGIOGRAPHY;  Surgeon: Yvonne Kendall, MD;  Location: [facility];  Service: Cardiovascular;  Laterality: N/A;   LUMBAR LAMINECTOMY/DECOMPRESSION MICRODISCECTOMY Left 08/01/2018   Procedure: Microdiscectomy - Lumbar four-Lumbar five - left;  Surgeon: Tia Alert, MD;  Location: [facility] OR;  Service: Neurosurgery;  Laterality: Left;   LUMBAR LAMINECTOMY/DECOMPRESSION MICRODISCECTOMY Left 10/15/2018   Procedure: Re-do Microdiscectomy - left - Lumbar four-Lumbar five;  Surgeon: Tia Alert, MD;  Location: [facility] OR;  Service: Neurosurgery;  Laterality: Left;   NECK SURGERY  1986   PATCH ANGIOPLASTY Left 06/05/2021   Procedure: PATCH ANGIOPLASTY USING Livia Snellen BIOLOGIC PATCH;  Surgeon: Cephus Shelling, MD;  Location: [facility] OR;  Service: Vascular;  Laterality: Left;   PERIPHERAL VASCULAR ATHERECTOMY  12/02/2019   Procedure: PERIPHERAL VASCULAR ATHERECTOMY;  Surgeon: Cephus Shelling, MD;  [facility]: [facility];  Service: Cardiovascular;;  left SFA   PERIPHERAL VASCULAR INTERVENTION  06/18/2018   Procedure: PERIPHERAL VASCULAR INTERVENTION;  Surgeon: Cephus Shelling, MD;  [facility]: [facility];  Service: Cardiovascular;;  Left external iliac   ROTATOR CUFF REPAIR Left    TEE WITHOUT CARDIOVERSION N/A 10/25/2021   Procedure: TRANSESOPHAGEAL ECHOCARDIOGRAM (TEE);  Surgeon: Corliss Skains, MD;  Location: [facility] OR;  Service: Open Heart Surgery;  Laterality: N/A;   UMBILICAL HERNIA REPAIR      Family History  Problem Relation Age of Onset   Diabetes Sister    Diabetes  Mellitus I Sister    Stroke Father    Heart disease Father    CVA Father    Coronary artery disease Father    Diabetes Mother    Heart disease Mother    Diabetes Mellitus I Mother    Coronary artery disease Mother    Coronary artery disease Brother    Heart disease Brother    Coronary artery disease Brother     SOCIAL HISTORY: Social History   Tobacco Use   Smoking status: Former    Current packs/day: 0.00    Types: Cigarettes    Start date: 12/1975    Quit date: 12/2015    Years since quitting: 7.5    Passive exposure: Current (Wife Is a smoker)   Smokeless tobacco: Never   Tobacco comments:    Quit 12-2012  Substance Use Topics   Alcohol use: No    Allergies  Allergen Reactions   Codeine Nausea Only    Current Outpatient Medications  Medication Sig Dispense Refill   acetaminophen (TYLENOL) 500 MG tablet Take 1,000 mg by mouth every 6 (six) hours as needed for  mild pain.     ALPRAZolam (XANAX) 0.5 MG tablet Take 1-2 tablets 30 minutes prior to MRI, may repeat once as needed. Must have driver. 3 tablet 0   amLODipine (NORVASC) 5 MG tablet Take 1 tablet (5 mg total) by mouth daily. 90 tablet 2   aspirin EC 81 MG EC tablet Take 1 tablet (81 mg total) by mouth daily at 6 (six) AM.     benazepril (LOTENSIN) 20 MG tablet Take 1 tablet (20 mg total) by mouth daily. 90 tablet 2   clopidogrel (PLAVIX) 75 MG tablet TAKE 1 TABLET BY MOUTH EVERY DAY 90 tablet 3   Continuous Blood Gluc Sensor (FREESTYLE LIBRE 2 SENSOR) MISC AS DIRECTED FOR BLOOD SUGAR CHECK DX E11.42     ezetimibe (ZETIA) 10 MG tablet Take 1 tablet (10 mg total) by mouth daily. 90 tablet 3   ferrous sulfate 325 (65 FE) MG tablet Take 325 mg by mouth daily.     gabapentin (NEURONTIN) 100 MG capsule Take 100-200 mg by mouth See admin instructions. Take 100 mg by mouth in the morning and 200 mg at bedtime     insulin degludec (TRESIBA) 200 UNIT/ML FlexTouch Pen Inject 34 Units into the skin in the morning. (Patient  taking differently: Inject 16 Units into the skin in the morning.)     iron polysaccharides (NIFEREX) 150 MG capsule Take 1 capsule (150 mg total) by mouth daily. 30 capsule 0   metFORMIN (GLUCOPHAGE-XR) 500 MG 24 hr tablet Take 2 tablets (1,000 mg total) by mouth 2 (two) times daily.     NOVOLOG FLEXPEN 100 UNIT/ML FlexPen INJECT UP TO 10 UNITS BEFORE MEALS SUBCUTANEOUS THREE TIMES DAILY 30 DAYS     ONETOUCH VERIO test strip 1 each 3 (three) times daily.     oxyCODONE-acetaminophen (PERCOCET) 5-325 MG tablet Take 1 tablet by mouth every 4 (four) hours as needed for severe pain. 20 tablet 0   pantoprazole (PROTONIX) 40 MG tablet Take 40 mg by mouth daily with breakfast.      pioglitazone (ACTOS) 45 MG tablet TAKE 1 TABLET DAILY (NEED APPOINTMENT FOR FURTHER REFILLS) 90 tablet 0   polyethylene glycol (MIRALAX / GLYCOLAX) 17 g packet Take 17 g by mouth daily as needed for moderate constipation.     rosuvastatin (CRESTOR) 40 MG tablet Take 1 tablet (40 mg total) by mouth daily. 90 tablet 3   Semaglutide, 1 MG/DOSE, (OZEMPIC, 1 MG/DOSE,) 4 MG/3ML SOPN Inject 1 mg into the skin every Wednesday. (Patient not taking: Reported on 07/09/2023)     No current facility-administered medications for this visit.    REVIEW OF SYSTEMS:  [X]  denotes positive finding, [ ]  denotes negative finding Cardiac  Comments:  Chest pain or chest pressure:    Shortness of breath upon exertion:    Short of breath when lying flat:    Irregular heart rhythm:        Vascular    Pain in calf, thigh, or hip brought on by ambulation:    Pain in feet at night that wakes you up from your sleep:     Blood clot in your veins:    Leg swelling:         Pulmonary    Oxygen at home:    Productive cough:     Wheezing:         Neurologic    Sudden weakness in arms or legs:     Sudden numbness in arms or legs:  Sudden onset of difficulty speaking or slurred speech:    Temporary loss of vision in one eye:     Problems with  dizziness:         Gastrointestinal    Blood in stool:     Vomited blood:         Genitourinary    Burning when urinating:     Blood in urine:        Psychiatric    Major depression:         Hematologic    Bleeding problems:    Problems with blood clotting too easily:        Skin    Rashes or ulcers:        Constitutional    Fever or chills:      PHYSICAL EXAM: Vitals:   07/09/23 1041  BP: (!) 186/70  Pulse: 71  Resp: 18  Temp: 97.9 F (36.6 C)  TempSrc: Temporal  SpO2: 98%  Weight: 162 lb (73.5 kg)  Height: 5\' 10"  (1.778 m)    GENERAL: The patient is a well-nourished male, in no acute distress. The vital signs are documented above. CARDIAC: There is a regular rate and rhythm.  VASCULAR:  Left groin incision well healed Bilateral femoral pulses palpable Left PT palpable No palpable right pedal pulses but no tissue loss PULMONARY: No respiratory distress. ABDOMEN: Soft and non-tender. MUSCULOSKELETAL: There are no major deformities or cyanosis. NEUROLOGIC: No focal weakness or paresthesias are detected. PSYCHIATRIC: The patient has a normal affect.  DATA:   Aortoiliac duplex today concern for greater 50% stenosis bilateral iliac arteries with a velocity of 340 in the right external iliac and 340 in the left common iliac  ABIs today are 0.58 on the right and 0.93 on the left  Left leg arterial duplex today shows patent bypass graft without flow limiting stenosis  Assessment/Plan:   73 y.o. male well-known to our practice that presents for 6 month follow-up for interval surveillance of his PAD.  He has extensive history including left external iliac stenting for claudication on 06/18/2018.  Later he had left SFA stenting for claudication on 12/02/2019.  Ultimately he then had left common femoral endarterectomy with profundoplasty and bovine patch as well as left common femoral to below-knee popliteal bypass with 6 mm PTFE on 06/05/2021.    Discussed that his  left leg bypass looks widely patent and he has a palpable PT pulse at the left ankle.  There is some concern for iliac disease on the left and he does endorse some left calf claudication when walking several blocks.  That being said this is not limiting at this time.  Will delay any further angiogram or intervention until he has more limiting symptoms.  He has no right leg complaints.  I will see in 6 months with repeat aortoiliac duplex and ABIs and will also be due for carotid duplex.  Continue aspirin Plavix statin as we discussed.   Cephus Shelling, MD [facility] Office: [phone]

## 2023-07-10 ENCOUNTER — Ambulatory Visit
Admission: RE | Admit: 2023-07-10 | Discharge: 2023-07-10 | Disposition: A | Discharge status: Home / Self Care | Payer: Medicare Other | Source: Ambulatory Visit | Attending: Family Medicine | Admitting: Family Medicine

## 2023-07-10 ENCOUNTER — Encounter: Payer: Self-pay | Admitting: Physical Therapy

## 2023-07-10 DIAGNOSIS — Z87891 Personal history of nicotine dependence: Secondary | ICD-10-CM | POA: Diagnosis not present

## 2023-07-10 DIAGNOSIS — F17210 Nicotine dependence, cigarettes, uncomplicated: Secondary | ICD-10-CM

## 2023-07-10 DIAGNOSIS — Z122 Encounter for screening for malignant neoplasm of respiratory organs: Secondary | ICD-10-CM

## 2023-07-18 ENCOUNTER — Other Ambulatory Visit: Payer: Self-pay | Admitting: *Deleted

## 2023-07-18 DIAGNOSIS — I739 Peripheral vascular disease, unspecified: Secondary | ICD-10-CM

## 2023-07-18 DIAGNOSIS — I6523 Occlusion and stenosis of bilateral carotid arteries: Secondary | ICD-10-CM

## 2023-07-22 ENCOUNTER — Ambulatory Visit: Payer: Medicare Other | Attending: Cardiovascular Disease | Admitting: Cardiovascular Disease

## 2023-07-22 ENCOUNTER — Encounter: Payer: Self-pay | Admitting: Cardiovascular Disease

## 2023-07-22 VITALS — BP 156/62 | HR 80 | Ht 70.0 in | Wt 164.8 lb

## 2023-07-22 DIAGNOSIS — I10 Essential (primary) hypertension: Secondary | ICD-10-CM

## 2023-07-22 DIAGNOSIS — Z952 Presence of prosthetic heart valve: Secondary | ICD-10-CM | POA: Diagnosis not present

## 2023-07-22 DIAGNOSIS — I2510 Atherosclerotic heart disease of native coronary artery without angina pectoris: Secondary | ICD-10-CM

## 2023-07-22 MED ORDER — AMLODIPINE BESYLATE 10 MG PO TABS
10.0000 mg | ORAL_TABLET | Freq: Every day | ORAL | 3 refills | Status: AC
Start: 2023-07-22 — End: ?

## 2023-07-22 NOTE — Patient Instructions (Signed)
 Medication Instructions:  Your physician has recommended you make the following change in your medication:  1.) increase amlodipine to 10 mg - take one tablet daily  *If you need a refill on your cardiac medications before your next appointment, please call your pharmacy*   Lab Work: none   Testing/Procedures: none   Follow-Up: At [facility], you and your health needs are our priority.  As part of our continuing mission to provide you with exceptional heart care, we have created designated Provider Care Teams.  These Care Teams include your primary Cardiologist (physician) and Advanced Practice Providers (APPs -  Physician Assistants and Nurse Practitioners) who all work together to provide you with the care you need, when you need it.   Your next appointment:   12 month(s)  Provider:   Verne Carrow, MD

## 2023-07-22 NOTE — Progress Notes (Signed)
 Chief Complaint  Patient presents with   Follow-up    CAD    History of Present Illness: 73 yo male with history of CAD s/p 3V CABG and AVR in 2023, PAD, carotid artery disease, HTN, HLD, tobacco abuse and aortic stenosis who is here today for follow up. He has been followed in our office by Dr. Shari Prows. He had 3V CABG and surgical AVR in May 2023. Cardiac monitor in July 2023 with short runs of SVT but no heart block. Echo July 2023 with LVEF=55-60%, normal RV function, well functioning AVR. He has PAD and has undergone left fem-pop bypass in 2023. He is followed by Dr. Chestine Spore in the [facility] office. He has carotid artery disease and has had left carotid endarterectomy.   He is here today for follow up. The patient denies any exertional chest pain, dyspnea, palpitations, lower extremity edema, orthopnea, PND, dizziness, near syncope or syncope.   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   Dysphagia    Esophageal dysmotility    GERD (gastroesophageal reflux disease)    Heart murmur    pt had recent echocardiogram   History of hiatal hernia    History of Holter monitoring 2009   History of kidney stones    HNP (herniated nucleus pulposus), lumbar    L4-5   HYPERCHOLESTEROLEMIA 11/30/2009   Hyperlipidemia    HYPERTENSION 11/30/2009   Kidney stones    Numbness and tingling    Peripheral vascular disease (HCC)    Right carotid bruit    SMOKER 11/30/2009   Stroke (HCC) 2012   URINARY CALCULUS 11/30/2009    Past Surgical History:  Procedure Laterality Date   ABDOMINAL AORTOGRAM W/LOWER EXTREMITY N/A 06/18/2018   Procedure: ABDOMINAL AORTOGRAM W/LOWER EXTREMITY;  Surgeon: Cephus Shelling, MD;  Location: [facility];  Service: Cardiovascular;   Laterality: N/A;   ABDOMINAL AORTOGRAM W/LOWER EXTREMITY N/A 12/02/2019   Procedure: ABDOMINAL AORTOGRAM W/LOWER EXTREMITY;  Surgeon: Cephus Shelling, MD;  Location: [facility];  Service: Cardiovascular;  Laterality: N/A;   ABDOMINAL AORTOGRAM W/LOWER EXTREMITY N/A 03/23/2021   Procedure: ABDOMINAL AORTOGRAM W/LOWER EXTREMITY;  Surgeon: Cephus Shelling, MD;  Location: [facility];  Service: Cardiovascular;  Laterality: N/A;   AORTIC VALVE REPLACEMENT N/A 10/25/2021   Procedure: AORTIC VALVE REPLACEMENT (AVR)USING INSPIRIS RESILIA  AORTIC VALVE;  Surgeon: Corliss Skains, MD;  Location: [facility];  Service: Open Heart Surgery;  Laterality: N/A;   arthroscopy of right shoulder Right    Jan 2019, June 2019-- done at [facility], Dr. Magnus Ivan   BACK SURGERY     CATARACT EXTRACTION W/ INTRAOCULAR LENS  IMPLANT, BILATERAL     CORONARY ARTERY BYPASS GRAFT N/A 10/25/2021   Procedure: CORONARY ARTERY BYPASS GRAFTING (CABG) X 3 USING LEFT INTERNAL MAMMARY ARTERY AND RIGHT GREATER SAPHENOUS VEIN;  Surgeon: Corliss Skains, MD;  Location: [facility];  Service: Open Heart Surgery;  Laterality: N/A;   CYSTOSCOPY     ENDARTERECTOMY Right 08/24/2019   Procedure: ENDARTERECTOMY CAROTID;  Surgeon: Cephus Shelling, MD;  Location: [facility] OR;  Service: Vascular;  Laterality: Right;   ENDARTERECTOMY FEMORAL Left 06/05/2021  Procedure: ENDARTERECTOMY FEMORAL WITH PROFUNDAPLASTY;  Surgeon: Cephus Shelling, MD;  Location: [facility] OR;  Service: Vascular;  Laterality: Left;   ENDOVEIN HARVEST OF GREATER SAPHENOUS VEIN Right 10/25/2021   Procedure: ENDOVEIN HARVEST OF GREATER SAPHENOUS VEIN;  Surgeon: Corliss Skains, MD;  Location: [facility];  Service: Open Heart Surgery;  Laterality: Right;   FACIAL COSMETIC SURGERY  2014   FEMORAL-POPLITEAL BYPASS GRAFT Left 06/05/2021   Procedure: LEFT FEMORAL-POPLITEAL ARTERY BYPASS GRAFTING USING 6mm PROPATEN VASCULAR REMOVABLE RING GRAFT;   Surgeon: Cephus Shelling, MD;  Location: [facility];  Service: Vascular;  Laterality: Left;   FINGER SURGERY  2002   INGUINAL HERNIA REPAIR Left 02/01/2023   Procedure: LAPAROSCOPIC LEFT INGUINAL HERNIA REPAIR WITH MESH;  Surgeon: Quentin Ore, MD;  Location: [facility];  Service: General;  Laterality: Left;  90   LEFT HEART CATH AND CORONARY ANGIOGRAPHY N/A 10/05/2021   Procedure: LEFT HEART CATH AND CORONARY ANGIOGRAPHY;  Surgeon: Yvonne Kendall, MD;  [facility]: [facility];  Service: Cardiovascular;  Laterality: N/A;   LUMBAR LAMINECTOMY/DECOMPRESSION MICRODISCECTOMY Left 08/01/2018   Procedure: Microdiscectomy - Lumbar four-Lumbar five - left;  Surgeon: Tia Alert, MD;  Location: [facility] OR;  Service: Neurosurgery;  Laterality: Left;   LUMBAR LAMINECTOMY/DECOMPRESSION MICRODISCECTOMY Left 10/15/2018   Procedure: Re-do Microdiscectomy - left - Lumbar four-Lumbar five;  Surgeon: Tia Alert, MD;  Location: [facility] OR;  Service: Neurosurgery;  Laterality: Left;   NECK SURGERY  1986   PATCH ANGIOPLASTY Left 06/05/2021   Procedure: PATCH ANGIOPLASTY USING Livia Snellen BIOLOGIC PATCH;  Surgeon: Cephus Shelling, MD;  Location: [facility] OR;  Service: Vascular;  Laterality: Left;   PERIPHERAL VASCULAR ATHERECTOMY  12/02/2019   Procedure: PERIPHERAL VASCULAR ATHERECTOMY;  Surgeon: Cephus Shelling, MD;  [facility];  Service: Cardiovascular;;  left SFA   PERIPHERAL VASCULAR INTERVENTION  06/18/2018   Procedure: PERIPHERAL VASCULAR INTERVENTION;  Surgeon: Cephus Shelling, MD;  [facility];  Service: Cardiovascular;;  Left external iliac   ROTATOR CUFF REPAIR Left    TEE WITHOUT CARDIOVERSION N/A 10/25/2021   Procedure: TRANSESOPHAGEAL ECHOCARDIOGRAM (TEE);  Surgeon: Corliss Skains, MD;  Location: [facility] OR;  Service: Open Heart Surgery;  Laterality: N/A;   UMBILICAL HERNIA REPAIR      Current Outpatient Medications  Medication Sig Dispense Refill   acetaminophen  (TYLENOL) 500 MG tablet Take 1,000 mg by mouth every 6 (six) hours as needed for mild pain.     ALPRAZolam (XANAX) 0.5 MG tablet Take 1-2 tablets 30 minutes prior to MRI, may repeat once as needed. Must have driver. 3 tablet 0   amLODipine (NORVASC) 10 MG tablet Take 1 tablet (10 mg total) by mouth daily. 90 tablet 3   aspirin EC 81 MG EC tablet Take 1 tablet (81 mg total) by mouth daily at 6 (six) AM.     benazepril (LOTENSIN) 20 MG tablet Take 1 tablet (20 mg total) by mouth daily. (Patient taking differently: Take 20 mg by mouth in the morning and at bedtime.) 90 tablet 2   clopidogrel (PLAVIX) 75 MG tablet TAKE 1 TABLET BY MOUTH EVERY DAY 90 tablet 3   Continuous Blood Gluc Sensor (FREESTYLE LIBRE 2 SENSOR) MISC AS DIRECTED FOR BLOOD SUGAR CHECK DX E11.42     ezetimibe (ZETIA) 10 MG tablet Take 1 tablet (10 mg total) by mouth daily. 90 tablet 3   ferrous sulfate 325 (65 FE) MG tablet Take 325 mg by mouth  daily.     gabapentin (NEURONTIN) 100 MG capsule Take 100-200 mg by mouth See admin instructions. Take 100 mg by mouth in the morning and 200 mg at bedtime     insulin degludec (TRESIBA) 200 UNIT/ML FlexTouch Pen Inject 34 Units into the skin in the morning. (Patient taking differently: Inject 16 Units into the skin in the morning.)     iron polysaccharides (NIFEREX) 150 MG capsule Take 1 capsule (150 mg total) by mouth daily. 30 capsule 0   metFORMIN (GLUCOPHAGE-XR) 500 MG 24 hr tablet Take 2 tablets (1,000 mg total) by mouth 2 (two) times daily.     NOVOLOG FLEXPEN 100 UNIT/ML FlexPen INJECT UP TO 10 UNITS BEFORE MEALS SUBCUTANEOUS THREE TIMES DAILY 30 DAYS     ONETOUCH VERIO test strip 1 each 3 (three) times daily.     oxyCODONE-acetaminophen (PERCOCET) 5-325 MG tablet Take 1 tablet by mouth every 4 (four) hours as needed for severe pain. 20 tablet 0   pantoprazole (PROTONIX) 40 MG tablet Take 40 mg by mouth daily with breakfast.      pioglitazone (ACTOS) 45 MG tablet TAKE 1 TABLET DAILY  (NEED APPOINTMENT FOR FURTHER REFILLS) 90 tablet 0   polyethylene glycol (MIRALAX / GLYCOLAX) 17 g packet Take 17 g by mouth daily as needed for moderate constipation.     rosuvastatin (CRESTOR) 40 MG tablet Take 1 tablet (40 mg total) by mouth daily. 90 tablet 3   No current facility-administered medications for this visit.    Allergies  Allergen Reactions   Codeine Nausea Only    Social History   Socioeconomic History   Marital status: Married    Spouse name: [name]    Number of children: 3   Years of education: 11   Highest education level: Not on file  Occupational History   Occupation: Truck Air traffic controller: [employer]  Tobacco Use   Smoking status: Former    Current packs/day: 0.00    Types: Cigarettes    Start date: 12/1975    Quit date: 12/2015    Years since quitting: 7.5    Passive exposure: Current (Wife Is a smoker)   Smokeless tobacco: Never   Tobacco comments:    Quit 12-2012  Vaping Use   Vaping status: Never Used  Substance and Sexual Activity   Alcohol use: No   Drug use: No   Sexual activity: Yes    Birth control/protection: None  Other Topics Concern   Not on file  Social History Narrative   . Patient drinks 2-3 cups of caffeine daily.    Patient lives at home with his wife [name].    Patient works at [employer].    Education. 12 th grade   Right handed               Social Drivers of Corporate investment banker Strain: Not on file  Food Insecurity: No Food Insecurity (02/28/2022)   Hunger Vital Sign    Worried About Running Out of Food in the Last Year: Never true    Ran Out of Food in the Last Year: Never true  Transportation Needs: No Transportation Needs (02/28/2022)   PRAPARE - Administrator, Civil Service (Medical): No    Lack of Transportation (Non-Medical): No  Physical Activity: Not on file  Stress: Not on file  Social Connections: Not on file  Intimate Partner Violence: Not At Risk (02/28/2022)   Humiliation, Afraid,  Rape, and Kick questionnaire  Fear of Current or Ex-Partner: No    Emotionally Abused: No    Physically Abused: No    Sexually Abused: No    Family History  Problem Relation Age of Onset   Diabetes Sister    Diabetes Mellitus I Sister    Stroke Father    Heart disease Father    CVA Father    Coronary artery disease Father    Diabetes Mother    Heart disease Mother    Diabetes Mellitus I Mother    Coronary artery disease Mother    Coronary artery disease Brother    Heart disease Brother    Coronary artery disease Brother     Review of Systems:  As stated in the HPI and otherwise negative.   BP (!) 156/62   Pulse 80   Ht 5\' 10"  (1.778 m)   Wt 74.8 kg   SpO2 99%   BMI 23.65 kg/m   Physical Examination: General: Well developed, well nourished, NAD  HEENT: OP clear, mucus membranes moist  SKIN: warm, dry. No rashes. Neuro: No focal deficits  Musculoskeletal: Muscle strength 5/5 all ext  Psychiatric: Mood and affect normal  Neck: No JVD, no carotid bruits, no thyromegaly, no lymphadenopathy.  Lungs:Clear bilaterally, no wheezes, rhonci, crackles Cardiovascular: Regular rate and rhythm. Systolic murmur.  Abdomen:Soft. Bowel sounds present. Non-tender.  Extremities: No lower extremity edema. Pulses are 2 + in the bilateral DP/PT.  EKG:  EKG is not ordered today. The ekg ordered today demonstrates   Recent Labs: 12/26/2022: TSH 1.230 01/17/2023: BUN 31; Creatinine, Ser 1.26; Hemoglobin 12.0; Platelets 207; Potassium 3.7; Sodium 134   Lipid Panel    Component Value Date/Time   CHOL 97 (L) 08/28/2022 0847   TRIG 74 08/28/2022 0847   HDL 53 08/28/2022 0847   CHOLHDL 1.8 08/28/2022 0847   CHOLHDL 2.4 06/06/2021 0329   VLDL 12 06/06/2021 0329   LDLCALC 28 08/28/2022 0847     Wt Readings from Last 3 Encounters:  07/22/23 74.8 kg  07/09/23 73.5 kg  02/22/23 68 kg    Assessment and Plan:   1. CAD s/p CABG without angina: No chest pain. Normal LV function by  echo in July 2024. Continue ASA, Plavix, Crestor and Zetia. LDL 28 in April 2024.   2. Aortic valve disease: He is s/p AVR in 2023. Valve working well by echo in July 2024. Continue SBE prophylaxis as indicated.   3. HTN: BP is elevated today and has been elevated at home. Will continue Lotensin 20 mg po BID. Will increase Norvasc to 10 mg daily. He will follow BP closely at home and if it remains elevated, he will let us know.   Labs/ tests ordered today include:  No orders of the defined types were placed in this encounter.  Disposition:   F/U with me in 12 months   Signed, Verne Carrow, MD, [facility] 07/22/2023 10:55 AM    [facility] [address] [place], [place], [place]  [address] Phone: [phone]; Fax: [phone]

## 2023-07-29 ENCOUNTER — Other Ambulatory Visit: Payer: Self-pay

## 2023-07-29 DIAGNOSIS — Z87891 Personal history of nicotine dependence: Secondary | ICD-10-CM

## 2023-07-29 DIAGNOSIS — Z122 Encounter for screening for malignant neoplasm of respiratory organs: Secondary | ICD-10-CM

## 2023-08-15 DIAGNOSIS — D509 Iron deficiency anemia, unspecified: Secondary | ICD-10-CM | POA: Diagnosis not present

## 2023-08-18 ENCOUNTER — Encounter: Payer: Self-pay | Admitting: Cardiovascular Disease

## 2023-08-22 ENCOUNTER — Other Ambulatory Visit: Payer: Self-pay | Admitting: *Deleted

## 2023-08-22 DIAGNOSIS — M79605 Pain in left leg: Secondary | ICD-10-CM

## 2023-08-22 DIAGNOSIS — M7989 Other specified soft tissue disorders: Secondary | ICD-10-CM

## 2023-08-22 DIAGNOSIS — I739 Peripheral vascular disease, unspecified: Secondary | ICD-10-CM

## 2023-08-23 ENCOUNTER — Other Ambulatory Visit (HOSPITAL_COMMUNITY): Payer: Self-pay | Admitting: Physician Assistant

## 2023-08-23 ENCOUNTER — Ambulatory Visit (INDEPENDENT_AMBULATORY_CARE_PROVIDER_SITE_OTHER)
Admission: RE | Admit: 2023-08-23 | Discharge: 2023-08-23 | Disposition: A | Discharge status: Home / Self Care | Source: Ambulatory Visit | Attending: Vascular Surgery | Admitting: Vascular Surgery

## 2023-08-23 ENCOUNTER — Ambulatory Visit (HOSPITAL_COMMUNITY)
Admission: RE | Admit: 2023-08-23 | Discharge: 2023-08-23 | Disposition: A | Discharge status: Home / Self Care | Source: Ambulatory Visit | Attending: Vascular Surgery | Admitting: Vascular Surgery

## 2023-08-23 ENCOUNTER — Ambulatory Visit

## 2023-08-23 VITALS — BP 182/69 | HR 77 | Temp 98.3°F | Ht 70.0 in | Wt 169.5 lb

## 2023-08-23 DIAGNOSIS — M7989 Other specified soft tissue disorders: Secondary | ICD-10-CM

## 2023-08-23 DIAGNOSIS — I739 Peripheral vascular disease, unspecified: Secondary | ICD-10-CM

## 2023-08-23 DIAGNOSIS — M79605 Pain in left leg: Secondary | ICD-10-CM | POA: Insufficient documentation

## 2023-08-23 LAB — VAS US ABI WITH/WO TBI
Left ABI: 0.96
Right ABI: 0.54

## 2023-08-23 NOTE — Progress Notes (Signed)
 HISTORY AND PHYSICAL     CC:  follow up. Requesting Provider:  Irven Coe, MD  HPI: This is a 73 y.o. male who is here today for follow up for PAD.  Pt has hx of  1.  Left common femoral endarterectomy with profundoplasty and endarterectomy of the proximal SFA with bovine pericardial patch angioplasty 2.  Left common femoral to below-knee popliteal artery bypass with 6 mm ringed PTFE 06/05/2021 by Dr. Chestine Spore    The pt returns today with c/o of continued swelling in the left leg.  He states that he was wearing his compression socks but he started bruising around the top of the sock.  He states the swelling is improved in the mornings and worsens throughout the day.  He does stay active.  He does elevate his legs when able.  He denies claudication, rest pain or non healing wounds.   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   Dysphagia    Esophageal dysmotility    GERD (gastroesophageal reflux disease)    Heart murmur    pt had recent echocardiogram   History of hiatal hernia    History of Holter monitoring 2009   History of kidney stones    HNP (herniated nucleus pulposus), lumbar    L4-5   HYPERCHOLESTEROLEMIA 11/30/2009   Hyperlipidemia    HYPERTENSION 11/30/2009   Kidney stones    Numbness and tingling    Peripheral vascular disease (HCC)    Right carotid bruit    SMOKER 11/30/2009   Stroke (HCC) 2012   URINARY CALCULUS 11/30/2009    Past Surgical History:  Procedure Laterality Date   ABDOMINAL AORTOGRAM W/LOWER EXTREMITY N/A 06/18/2018   Procedure: ABDOMINAL AORTOGRAM W/LOWER EXTREMITY;  Surgeon: Cephus Shelling, MD;  Location: [facility];  Service: Cardiovascular;  Laterality: N/A;   ABDOMINAL AORTOGRAM W/LOWER EXTREMITY N/A 12/02/2019   Procedure: ABDOMINAL AORTOGRAM W/LOWER  EXTREMITY;  Surgeon: Cephus Shelling, MD;  Location: [facility];  Service: Cardiovascular;  Laterality: N/A;   ABDOMINAL AORTOGRAM W/LOWER EXTREMITY N/A 03/23/2021   Procedure: ABDOMINAL AORTOGRAM W/LOWER EXTREMITY;  Surgeon: Cephus Shelling, MD;  Location: [facility];  Service: Cardiovascular;  Laterality: N/A;   AORTIC VALVE REPLACEMENT N/A 10/25/2021   Procedure: AORTIC VALVE REPLACEMENT (AVR)USING INSPIRIS RESILIA  AORTIC VALVE;  Surgeon: Corliss Skains, MD;  Location: [facility];  Service: Open Heart Surgery;  Laterality: N/A;   arthroscopy of right shoulder Right    Jan 2019, June 2019-- done at [facility], Dr. Magnus Ivan   BACK SURGERY     CATARACT EXTRACTION W/ INTRAOCULAR LENS  IMPLANT, BILATERAL     CORONARY ARTERY BYPASS GRAFT N/A 10/25/2021   Procedure: CORONARY ARTERY BYPASS GRAFTING (CABG) X 3 USING LEFT INTERNAL MAMMARY ARTERY AND RIGHT GREATER SAPHENOUS VEIN;  Surgeon: Corliss Skains, MD;  Location: [facility];  Service: Open Heart Surgery;  Laterality: N/A;   CYSTOSCOPY     ENDARTERECTOMY Right 08/24/2019   Procedure: ENDARTERECTOMY CAROTID;  Surgeon: Cephus Shelling, MD;  Location: [facility] OR;  Service: Vascular;  Laterality: Right;   ENDARTERECTOMY FEMORAL Left 06/05/2021   Procedure: ENDARTERECTOMY FEMORAL WITH PROFUNDAPLASTY;  Surgeon: Cephus Shelling, MD;  Location:  [facility];  Service: Vascular;  Laterality: Left;   ENDOVEIN HARVEST OF GREATER SAPHENOUS VEIN Right 10/25/2021   Procedure: ENDOVEIN HARVEST OF GREATER SAPHENOUS VEIN;  Surgeon: Corliss Skains, MD;  Location: [facility];  Service: Open Heart Surgery;  Laterality: Right;   FACIAL COSMETIC SURGERY  2014   FEMORAL-POPLITEAL BYPASS GRAFT Left 06/05/2021   Procedure: LEFT FEMORAL-POPLITEAL ARTERY BYPASS GRAFTING USING 6mm PROPATEN VASCULAR REMOVABLE RING GRAFT;  Surgeon: Cephus Shelling, MD;  Location: [facility];  Service: Vascular;  Laterality: Left;   FINGER SURGERY   2002   INGUINAL HERNIA REPAIR Left 02/01/2023   Procedure: LAPAROSCOPIC LEFT INGUINAL HERNIA REPAIR WITH MESH;  Surgeon: Quentin Ore, MD;  Location: [facility];  Service: General;  Laterality: Left;  90   LEFT HEART CATH AND CORONARY ANGIOGRAPHY N/A 10/05/2021   Procedure: LEFT HEART CATH AND CORONARY ANGIOGRAPHY;  Surgeon: Yvonne Kendall, MD;  [facility]: [facility];  Service: Cardiovascular;  Laterality: N/A;   LUMBAR LAMINECTOMY/DECOMPRESSION MICRODISCECTOMY Left 08/01/2018   Procedure: Microdiscectomy - Lumbar four-Lumbar five - left;  Surgeon: Tia Alert, MD;  Location[facility] OR;  Service: Neurosurgery;  Laterality: Left;   LUMBAR LAMINECTOMY/DECOMPRESSION MICRODISCECTOMY Left 10/15/2018   Procedure: Re-do Microdiscectomy - left - Lumbar four-Lumbar five;  Surgeon: Tia Alert, MD;  Location[facility] OR;  Service: Neurosurgery;  Laterality: Left;   NECK SURGERY  1986   PATCH ANGIOPLASTY Left 06/05/2021   Procedure: PATCH ANGIOPLASTY USING Livia Snellen BIOLOGIC PATCH;  Surgeon: Cephus Shelling, MD;  Location: [facility] OR;  Service: Vascular;  Laterality: Left;   PERIPHERAL VASCULAR ATHERECTOMY  12/02/2019   Procedure: PERIPHERAL VASCULAR ATHERECTOMY;  Surgeon: Cephus Shelling, MD;  [facility]: [facility];  Service: Cardiovascular;;  left SFA   PERIPHERAL VASCULAR INTERVENTION  06/18/2018   Procedure: PERIPHERAL VASCULAR INTERVENTION;  Surgeon: Cephus Shelling, MD;  [facility]: [facility];  Service: Cardiovascular;;  Left external iliac   ROTATOR CUFF REPAIR Left    TEE WITHOUT CARDIOVERSION N/A 10/25/2021   Procedure: TRANSESOPHAGEAL ECHOCARDIOGRAM (TEE);  Surgeon: Corliss Skains, MD;  Location: [facility] OR;  Service: Open Heart Surgery;  Laterality: N/A;   UMBILICAL HERNIA REPAIR      Allergies  Allergen Reactions   Codeine Nausea Only    Current Outpatient Medications  Medication Sig Dispense Refill   acetaminophen (TYLENOL) 500 MG tablet Take 1,000 mg by mouth  every 6 (six) hours as needed for mild pain.     ALPRAZolam (XANAX) 0.5 MG tablet Take 1-2 tablets 30 minutes prior to MRI, may repeat once as needed. Must have driver. 3 tablet 0   amLODipine (NORVASC) 10 MG tablet Take 1 tablet (10 mg total) by mouth daily. 90 tablet 3   aspirin EC 81 MG EC tablet Take 1 tablet (81 mg total) by mouth daily at 6 (six) AM.     benazepril (LOTENSIN) 20 MG tablet Take 1 tablet (20 mg total) by mouth daily. (Patient taking differently: Take 20 mg by mouth in the morning and at bedtime.) 90 tablet 2   clopidogrel (PLAVIX) 75 MG tablet TAKE 1 TABLET BY MOUTH EVERY DAY 90 tablet 3   Continuous Blood Gluc Sensor (FREESTYLE LIBRE 2 SENSOR) MISC AS DIRECTED FOR BLOOD SUGAR CHECK DX E11.42     ezetimibe (ZETIA) 10 MG tablet Take 1 tablet (10 mg total) by mouth daily. 90 tablet 3   ferrous sulfate 325 (65 FE) MG tablet Take 325 mg by mouth daily.  gabapentin (NEURONTIN) 100 MG capsule Take 100-200 mg by mouth See admin instructions. Take 100 mg by mouth in the morning and 200 mg at bedtime     insulin degludec (TRESIBA) 200 UNIT/ML FlexTouch Pen Inject 34 Units into the skin in the morning. (Patient taking differently: Inject 16 Units into the skin in the morning.)     iron polysaccharides (NIFEREX) 150 MG capsule Take 1 capsule (150 mg total) by mouth daily. 30 capsule 0   metFORMIN (GLUCOPHAGE-XR) 500 MG 24 hr tablet Take 2 tablets (1,000 mg total) by mouth 2 (two) times daily.     NOVOLOG FLEXPEN 100 UNIT/ML FlexPen INJECT UP TO 10 UNITS BEFORE MEALS SUBCUTANEOUS THREE TIMES DAILY 30 DAYS     ONETOUCH VERIO test strip 1 each 3 (three) times daily.     pantoprazole (PROTONIX) 40 MG tablet Take 40 mg by mouth daily with breakfast.      pioglitazone (ACTOS) 45 MG tablet TAKE 1 TABLET DAILY (NEED APPOINTMENT FOR FURTHER REFILLS) 90 tablet 0   polyethylene glycol (MIRALAX / GLYCOLAX) 17 g packet Take 17 g by mouth daily as needed for moderate constipation.     rosuvastatin  (CRESTOR) 40 MG tablet Take 1 tablet (40 mg total) by mouth daily. 90 tablet 3   oxyCODONE-acetaminophen (PERCOCET) 5-325 MG tablet Take 1 tablet by mouth every 4 (four) hours as needed for severe pain. (Patient not taking: Reported on 08/23/2023) 20 tablet 0   No current facility-administered medications for this visit.    Family History  Problem Relation Age of Onset   Diabetes Sister    Diabetes Mellitus I Sister    Stroke Father    Heart disease Father    CVA Father    Coronary artery disease Father    Diabetes Mother    Heart disease Mother    Diabetes Mellitus I Mother    Coronary artery disease Mother    Coronary artery disease Brother    Heart disease Brother    Coronary artery disease Brother     Social History   Socioeconomic History   Marital status: Married    Spouse name: [name]    Number of children: 3   Years of education: 11   Highest education level: Not on file  Occupational History   Occupation: Truck Air traffic controller: [employer]  Tobacco Use   Smoking status: Former    Current packs/day: 0.00    Types: Cigarettes    Start date: 12/1975    Quit date: 12/2015    Years since quitting: 7.6    Passive exposure: Current (Wife Is a smoker)   Smokeless tobacco: Never   Tobacco comments:    Quit 12-2012  Vaping Use   Vaping status: Never Used  Substance and Sexual Activity   Alcohol use: No   Drug use: No   Sexual activity: Yes    Birth control/protection: None  Other Topics Concern   Not on file  Social History Narrative   . Patient drinks 2-3 cups of caffeine daily.    Patient lives at home with his wife [name].    Patient works at [employer].    Education. 12 th grade   Right handed               Social Drivers of Corporate investment banker Strain: Not on file  Food Insecurity: No Food Insecurity (02/28/2022)   Hunger Vital Sign    Worried About Running Out of Food in  the Last Year: Never true    Ran Out of Food in the Last Year: Never true   Transportation Needs: No Transportation Needs (02/28/2022)   PRAPARE - Administrator, Civil Service (Medical): No    Lack of Transportation (Non-Medical): No  Physical Activity: Not on file  Stress: Not on file  Social Connections: Not on file  Intimate Partner Violence: Not At Risk (02/28/2022)   Humiliation, Afraid, Rape, and Kick questionnaire    Fear of Current or Ex-Partner: No    Emotionally Abused: No    Physically Abused: No    Sexually Abused: No    PHYSICAL EXAMINATION:  Today's Vitals   08/23/23 1256  BP: (!) 182/69  Pulse: 77  Temp: 98.3 F (36.8 C)  TempSrc: Temporal  SpO2: 98%  Weight: 169 lb 8 oz (76.9 kg)  Height: 5\' 10"  (1.778 m)   Body mass index is 24.32 kg/m.   General:  WDWN in NAD; vital signs documented above Gait: Not observed HENT: WNL, normocephalic Pulmonary: normal non-labored breathing , without wheezing Cardiac: regular HR Extremities: brisk doppler flow left foot.  + LLE swelling Musculoskeletal: no muscle wasting or atrophy  Neurologic: A&O X 3 Psychiatric:  The pt has Normal affect.   Non-Invasive Vascular Imaging:   ABI's/TBI's on 08/23/2023: Right:  0.54/0.31 - Great toe pressure: 53 Left:  0.96/0.78 - Great toe pressure: 134  DVT study on 08/23/2023: Negative for DVT     ASSESSMENT/PLAN:: 73 y.o. male here for follow up for PAD with hx 1.  Left common femoral endarterectomy with profundoplasty and endarterectomy of the proximal SFA with bovine pericardial patch angioplasty 2.  Left common femoral to below-knee popliteal artery bypass with 6 mm ringed PTFE 06/05/2021 by Dr. Chestine Spore    -pt comes in today with c/o LLE swelling.  DVT study was negative.  He states the compression was doing well but was bruising him at the top of the sock.  I wrapped his lower leg from foot to knee today with a mild to moderate compression with a 4" ace wrap.  I have instructed him to elevate his leg above his heart a couple of times per  day.  I have asked him to wrap his leg every morning with the ace wrap to help with the swelling and take it off at night.   Discussed that reperfusion swelling is common and may take months to improve.   -continue asa/statin -pt has f/u in August.  He will keep those appts and will call sooner if he has any issues before then.    Doreatha Massed, [facility] [phone]  Clinic MD:   Hetty Blend on call MD

## 2023-08-26 DIAGNOSIS — I10 Essential (primary) hypertension: Secondary | ICD-10-CM | POA: Diagnosis not present

## 2023-08-26 DIAGNOSIS — I25118 Atherosclerotic heart disease of native coronary artery with other forms of angina pectoris: Secondary | ICD-10-CM | POA: Diagnosis not present

## 2023-08-26 DIAGNOSIS — E782 Mixed hyperlipidemia: Secondary | ICD-10-CM | POA: Diagnosis not present

## 2023-08-26 DIAGNOSIS — E119 Type 2 diabetes mellitus without complications: Secondary | ICD-10-CM | POA: Diagnosis not present

## 2023-09-18 DIAGNOSIS — E1165 Type 2 diabetes mellitus with hyperglycemia: Secondary | ICD-10-CM | POA: Diagnosis not present

## 2023-09-18 DIAGNOSIS — E113291 Type 2 diabetes mellitus with mild nonproliferative diabetic retinopathy without macular edema, right eye: Secondary | ICD-10-CM | POA: Diagnosis not present

## 2023-09-18 DIAGNOSIS — I10 Essential (primary) hypertension: Secondary | ICD-10-CM | POA: Diagnosis not present

## 2023-09-18 DIAGNOSIS — E11649 Type 2 diabetes mellitus with hypoglycemia without coma: Secondary | ICD-10-CM | POA: Diagnosis not present

## 2023-09-18 DIAGNOSIS — E1142 Type 2 diabetes mellitus with diabetic polyneuropathy: Secondary | ICD-10-CM | POA: Diagnosis not present

## 2023-09-18 DIAGNOSIS — D509 Iron deficiency anemia, unspecified: Secondary | ICD-10-CM | POA: Diagnosis not present

## 2023-09-18 DIAGNOSIS — R809 Proteinuria, unspecified: Secondary | ICD-10-CM | POA: Diagnosis not present

## 2023-09-25 DIAGNOSIS — I25118 Atherosclerotic heart disease of native coronary artery with other forms of angina pectoris: Secondary | ICD-10-CM | POA: Diagnosis not present

## 2023-09-25 DIAGNOSIS — E782 Mixed hyperlipidemia: Secondary | ICD-10-CM | POA: Diagnosis not present

## 2023-09-25 DIAGNOSIS — E119 Type 2 diabetes mellitus without complications: Secondary | ICD-10-CM | POA: Diagnosis not present

## 2023-09-25 DIAGNOSIS — I10 Essential (primary) hypertension: Secondary | ICD-10-CM | POA: Diagnosis not present

## 2023-09-26 DIAGNOSIS — E1121 Type 2 diabetes mellitus with diabetic nephropathy: Secondary | ICD-10-CM | POA: Diagnosis not present

## 2023-09-26 DIAGNOSIS — D509 Iron deficiency anemia, unspecified: Secondary | ICD-10-CM | POA: Diagnosis not present

## 2023-09-26 DIAGNOSIS — E782 Mixed hyperlipidemia: Secondary | ICD-10-CM | POA: Diagnosis not present

## 2023-09-27 DIAGNOSIS — E1151 Type 2 diabetes mellitus with diabetic peripheral angiopathy without gangrene: Secondary | ICD-10-CM | POA: Diagnosis not present

## 2023-09-27 DIAGNOSIS — J439 Emphysema, unspecified: Secondary | ICD-10-CM | POA: Diagnosis not present

## 2023-09-27 DIAGNOSIS — G629 Polyneuropathy, unspecified: Secondary | ICD-10-CM | POA: Diagnosis not present

## 2023-09-27 DIAGNOSIS — E1121 Type 2 diabetes mellitus with diabetic nephropathy: Secondary | ICD-10-CM | POA: Diagnosis not present

## 2023-09-27 DIAGNOSIS — K219 Gastro-esophageal reflux disease without esophagitis: Secondary | ICD-10-CM | POA: Diagnosis not present

## 2023-09-27 DIAGNOSIS — R809 Proteinuria, unspecified: Secondary | ICD-10-CM | POA: Diagnosis not present

## 2023-09-27 DIAGNOSIS — E782 Mixed hyperlipidemia: Secondary | ICD-10-CM | POA: Diagnosis not present

## 2023-09-27 DIAGNOSIS — I10 Essential (primary) hypertension: Secondary | ICD-10-CM | POA: Diagnosis not present

## 2023-09-27 DIAGNOSIS — D509 Iron deficiency anemia, unspecified: Secondary | ICD-10-CM | POA: Diagnosis not present

## 2023-09-27 DIAGNOSIS — E1165 Type 2 diabetes mellitus with hyperglycemia: Secondary | ICD-10-CM | POA: Diagnosis not present

## 2023-09-27 DIAGNOSIS — Z0000 Encounter for general adult medical examination without abnormal findings: Secondary | ICD-10-CM | POA: Diagnosis not present

## 2023-10-03 ENCOUNTER — Ambulatory Visit: Payer: PPO | Admitting: Neurology

## 2023-10-03 ENCOUNTER — Encounter: Payer: Self-pay | Admitting: Neurology

## 2023-10-03 VITALS — BP 170/62 | HR 70 | Ht 70.0 in | Wt 160.5 lb

## 2023-10-03 DIAGNOSIS — I10 Essential (primary) hypertension: Secondary | ICD-10-CM

## 2023-10-03 DIAGNOSIS — R269 Unspecified abnormalities of gait and mobility: Secondary | ICD-10-CM

## 2023-10-03 DIAGNOSIS — I6523 Occlusion and stenosis of bilateral carotid arteries: Secondary | ICD-10-CM

## 2023-10-03 DIAGNOSIS — G250 Essential tremor: Secondary | ICD-10-CM | POA: Diagnosis not present

## 2023-10-03 DIAGNOSIS — I739 Peripheral vascular disease, unspecified: Secondary | ICD-10-CM

## 2023-10-03 DIAGNOSIS — I639 Cerebral infarction, unspecified: Secondary | ICD-10-CM | POA: Diagnosis not present

## 2023-10-03 MED ORDER — PROPRANOLOL HCL 20 MG PO TABS: 20.0000 mg | ORAL_TABLET | Freq: Two times a day (BID) | ORAL | 6 refills | Status: DC

## 2023-10-03 NOTE — Progress Notes (Signed)
 Chart reviewed, agree above plan ?
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
## 2023-10-03 NOTE — Patient Instructions (Signed)
 Great to meet you today.  We will try low-dose propranolol 20 mg twice a day for essential tremor.  Please keep close follow-up with vascular Dr. Fulton Job.  I will refer you to physical therapy for leg strengthening, gait and balance training.  Please call for any issues.  Follow-up in 6 months with Dr. Gracie Lav.  Thanks!!

## 2023-10-07 DIAGNOSIS — D485 Neoplasm of uncertain behavior of skin: Secondary | ICD-10-CM | POA: Diagnosis not present

## 2023-10-07 DIAGNOSIS — L814 Other melanin hyperpigmentation: Secondary | ICD-10-CM | POA: Diagnosis not present

## 2023-10-07 DIAGNOSIS — D235 Other benign neoplasm of skin of trunk: Secondary | ICD-10-CM | POA: Diagnosis not present

## 2023-10-07 DIAGNOSIS — L578 Other skin changes due to chronic exposure to nonionizing radiation: Secondary | ICD-10-CM | POA: Diagnosis not present

## 2023-10-07 DIAGNOSIS — L821 Other seborrheic keratosis: Secondary | ICD-10-CM | POA: Diagnosis not present

## 2023-10-07 DIAGNOSIS — D225 Melanocytic nevi of trunk: Secondary | ICD-10-CM | POA: Diagnosis not present

## 2023-10-07 DIAGNOSIS — D229 Melanocytic nevi, unspecified: Secondary | ICD-10-CM | POA: Diagnosis not present

## 2023-10-07 DIAGNOSIS — Z85828 Personal history of other malignant neoplasm of skin: Secondary | ICD-10-CM | POA: Diagnosis not present

## 2023-10-09 IMAGING — CT CT ANGIO AOBIFEM WO/W CM
2 of 10 series · 12 of 46 positions shown, 15 images · IV contrast (APPLIED)
Comparison: CT 01/07/2006

CLINICAL DATA: Peripheral arterial disease, worsening left calf
pain and swelling

EXAM:
CT ANGIOGRAPHY OF ABDOMINAL AORTA WITH ILIOFEMORAL RUNOFF
TECHNIQUE: Multidetector CT imaging of the abdomen, pelvis and lower
extremities was performed using the standard protocol during bolus
administration of intravenous contrast. Multiplanar CT image
reconstructions and MIPs were obtained to evaluate the vascular
anatomy.
CONTRAST:  100mL OMNIPAQUE IOHEXOL 350 MG/ML SOLN

[Series 5: axial arterial upper · axial · arterial · 0.67mm/px · z∈[-1698,-399]mm · 11 of 490 slices shown, 13 images]
[im 38/490  soft-tissue]
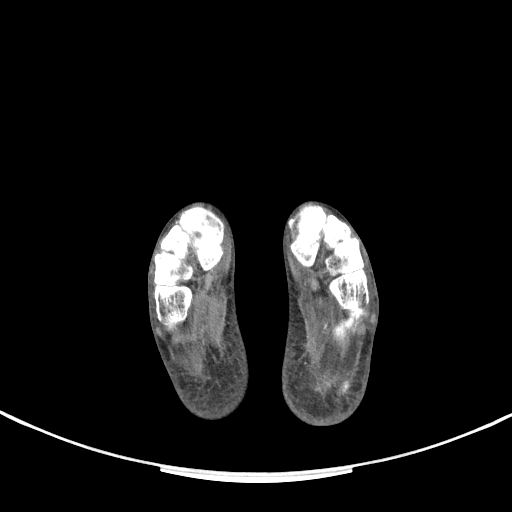
[im 38/490  bone]
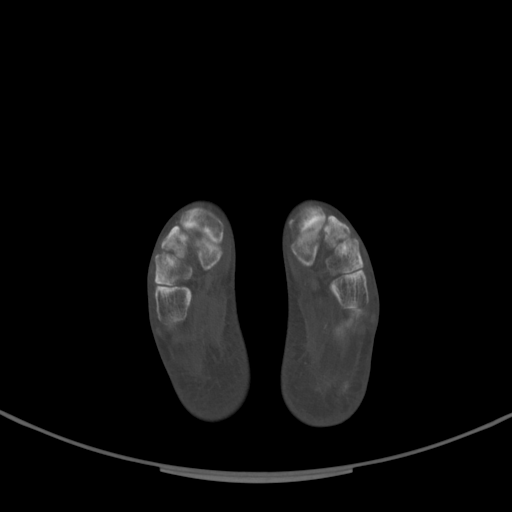
[im 95/490  soft-tissue]
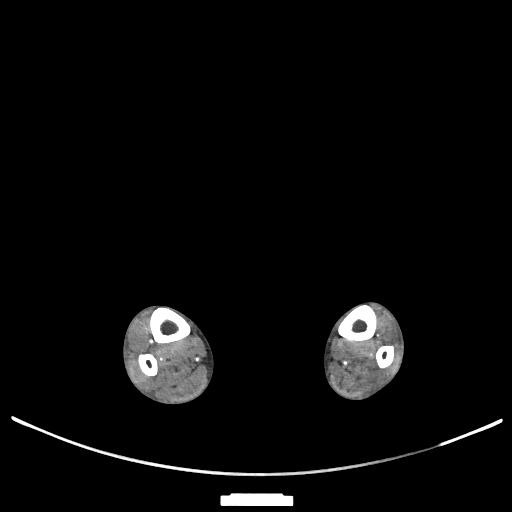
[im 151/490  soft-tissue]
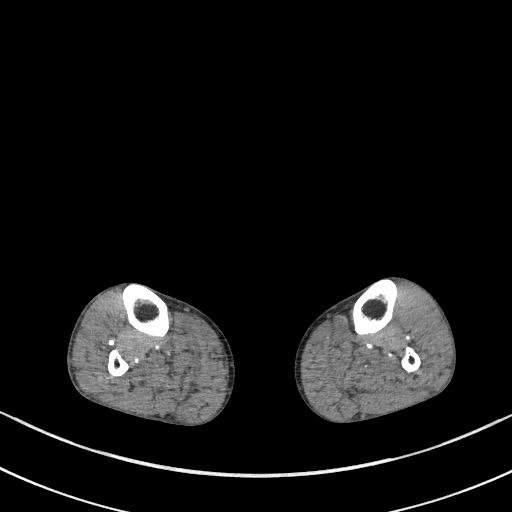
[im 207/490  soft-tissue]
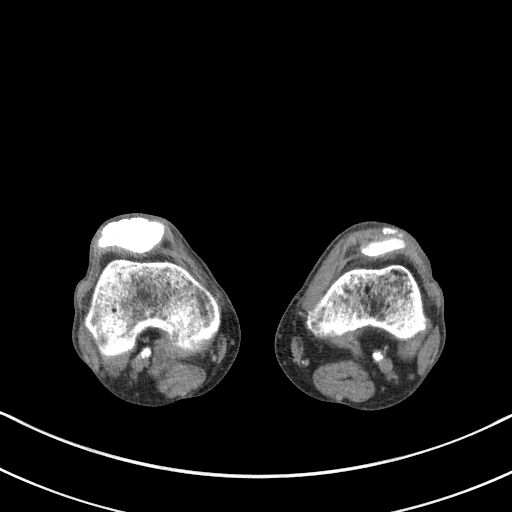
[im 283/490  soft-tissue]
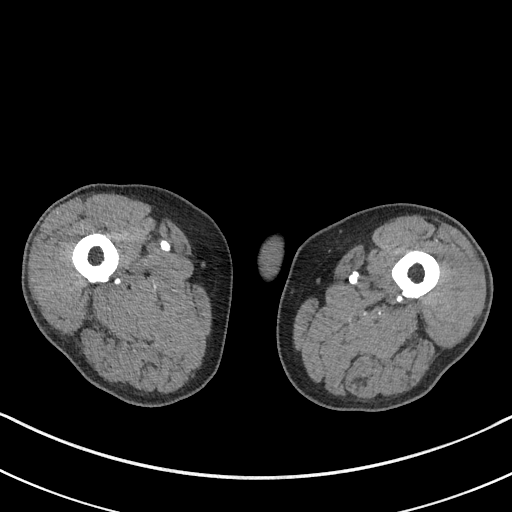
[im 339/490  soft-tissue]
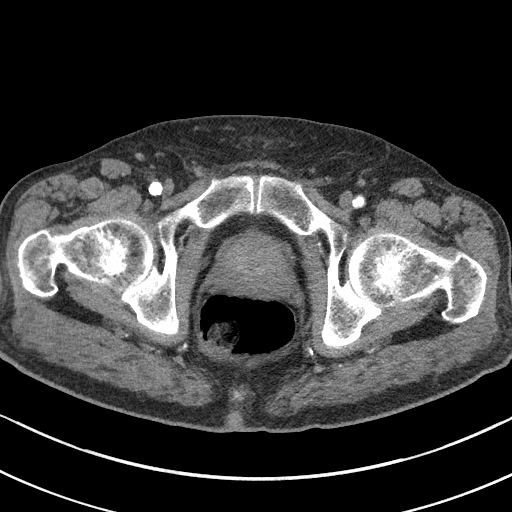
[im 395/490  soft-tissue]
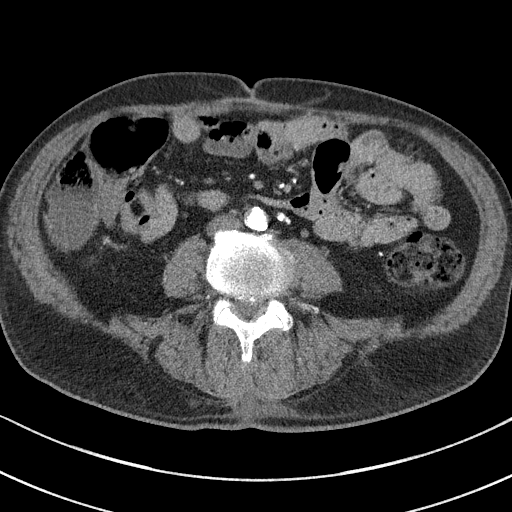
[im 414/490  lung]
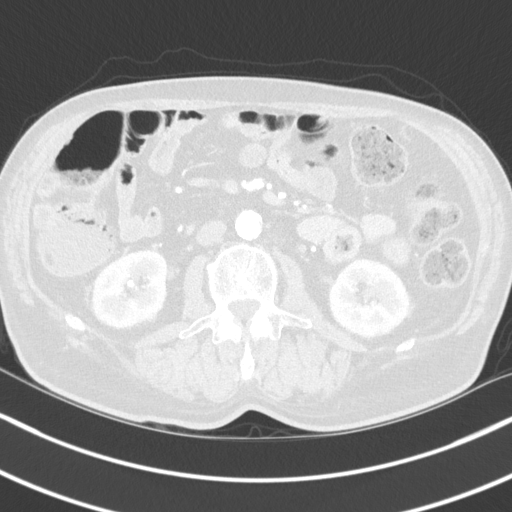
[im 433/490  lung]
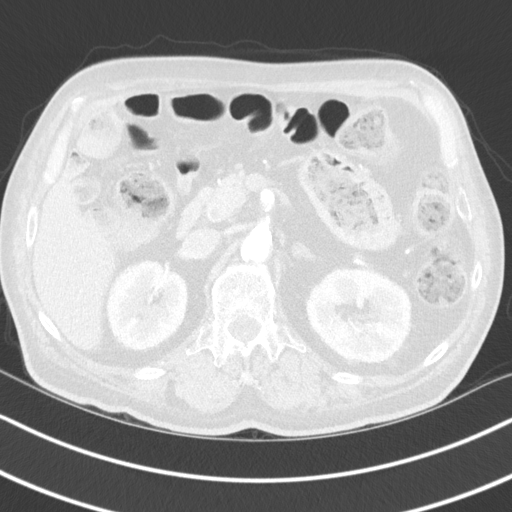
[im 452/490  soft-tissue]
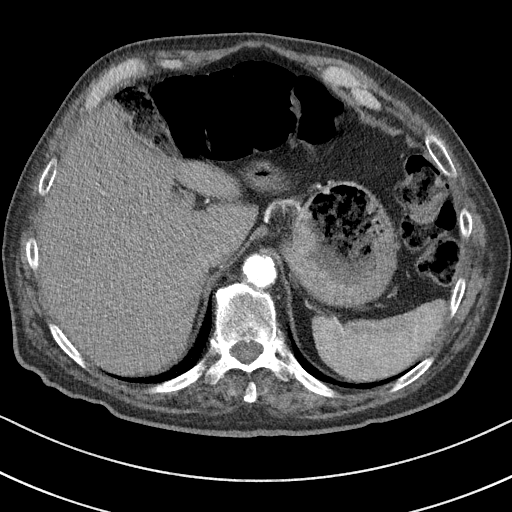
[im 452/490  lung]
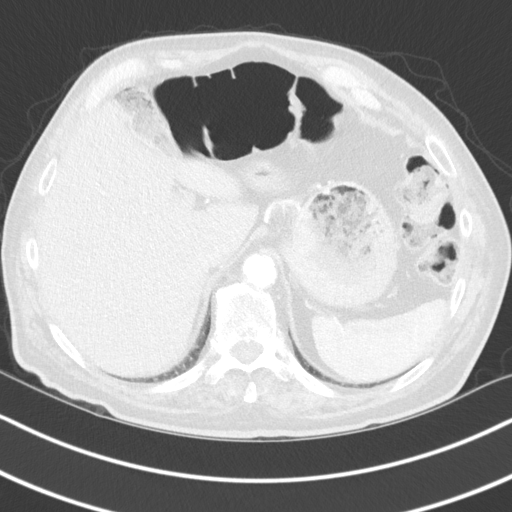
[im 471/490  lung]
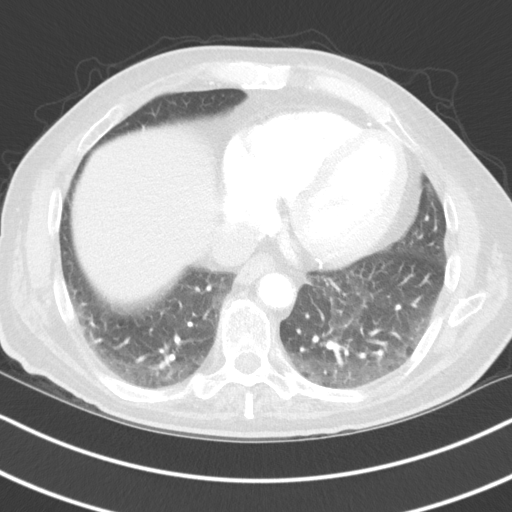

[Series 8: coronal upper · coronal · 0.80mm/px · 1 of 151 slices shown, 2 images]
[im 76/151  soft-tissue]
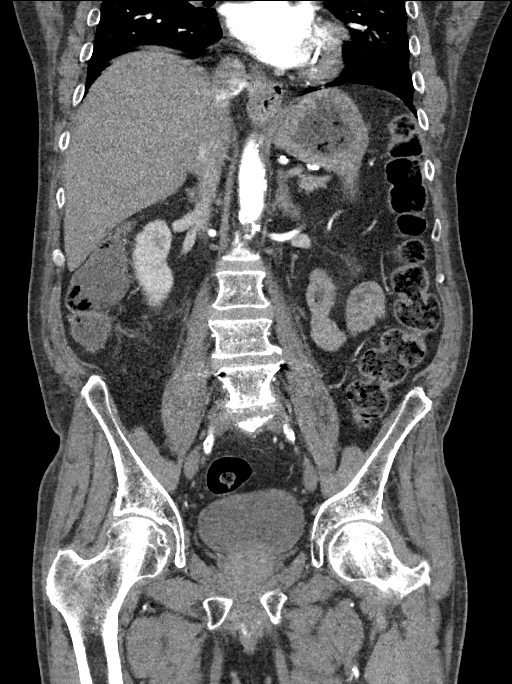
[im 76/151  bone]
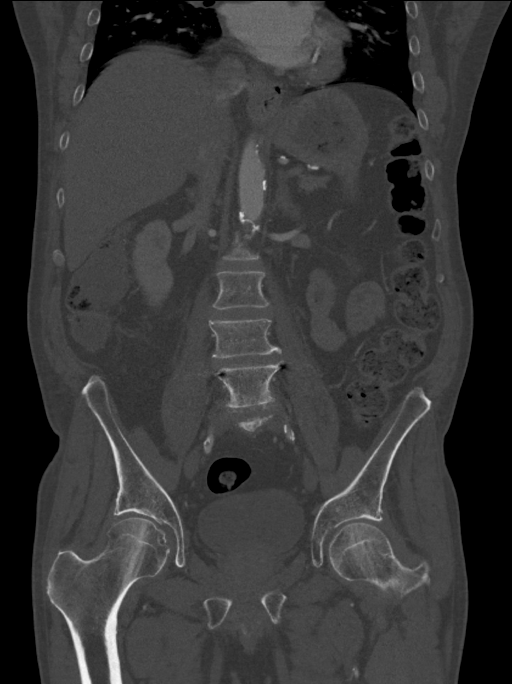

[12 of 46 positions shown; findings below may reference images not displayed]

FINDINGS: VASCULAR

Coronary calcifications.

Aorta: Moderate calcified atheromatous plaque particularly in the
infrarenal segment. No aneurysm, dissection, or stenosis.

Celiac: Calcified ostial plaque resulting in short segment stenosis
of at least mild severity, patent distally.

SMA: Heavily calcified ostial plaque resulting in 1.1 cm segment of
high-grade stenosis, atheromatous but patent distally with classic
distal branch anatomy.

Renals: Single bilaterally, both with partially calcified ostial
plaque resulting in short segment stenosis of at least mild
severity, patent distally.

IMA: Patent without evidence of aneurysm, dissection, vasculitis or
significant stenosis.

RIGHT Lower Extremity

Inflow: Common iliac heavily calcified eccentric plaque resulting in
long segment mild stenosis

Internal iliac origin stenosis, atheromatous but patent distally.

External iliac extensive eccentric calcified plaque resulting in
mild stenosis

Outflow: Common femoral extensive eccentric calcified plaque
resulting in long segment mild stenosis

Deep femoral branches patent

SFA extensive eccentric calcified plaque resulting in tandem areas
of mild to moderate stenosis throughout

Popliteal patent, with heavily calcified plaque just above and at
the knee resulting in tandem areas of mild stenosis.

Runoff: Heavily calcified plaque proximally, but apparently
contiguous three-vessel distal tibial runoff.

LEFT Lower Extremity

Inflow: Common iliac extensive calcified plaque without high-grade
stenosis

Internal iliac long segment atheromatous plaque with mild stenosis

External iliac extensive calcified plaque with patent stent in its
mid segment

Outflow: Common femoral eccentric calcified plaque without
high-grade stenosis

Deep femoral branches patent

SFA extensive calcified eccentric plaque resulting in tandem areas
of mild to moderate stenosis throughout. Short-segment occlusion at
the adductor hiatus extending into the proximal popliteal artery

Popliteal reconstituted above the knee, heavily atheromatous but
patent distally.

Runoff: Atheromatous calcifications proximally, but apparently
contiguous three-vessel tibial runoff.

Veins: No obvious venous abnormality within the limitations of this
arterial phase study.

Review of the MIP images confirms the above findings.

NON-VASCULAR

Lower chest: Small hiatal hernia. No pleural or pericardial
effusion.

Hepatobiliary: No focal liver abnormality is seen. No gallstones,
gallbladder wall thickening, or biliary dilatation.

Pancreas: Unremarkable. No pancreatic ductal dilatation or
surrounding inflammatory changes.

Spleen: Normal in size without focal abnormality.

Adrenals/Urinary Tract: 3 cm low-attenuation left adrenal nodule,
present since 01/07/2006, consistent with benign adenoma. Kidneys
unremarkable. No hydronephrosis. Urinary bladder is incompletely
distended.

Stomach/Bowel: Small hiatal hernia. The stomach is incompletely
distended, otherwise unremarkable. The small bowel is decompressed.
Appendix not discretely identified. The colon is nondilated,
unremarkable.

Lymphatic: No abdominal or pelvic adenopathy.

Reproductive: Mild prostate enlargement.

Other: No ascites.  Right pelvic phleboliths.  No free air.

Musculoskeletal: Solid-appearing instrumented PLIF L4-5. No fracture
or worrisome bone lesion.
IMPRESSION: 1. Aortic Atherosclerosis (009GK-170.0) with visceral and renal
artery ostial stenoses as above.
2. Tandem stenoses in RIGHT common and external iliac arteries of at
least mild severity.
3. Long segment RIGHT SFA occlusive disease, patent popliteal with
apparently contiguous tibial runoff.
4. Patent LEFT external iliac artery stent.
5. Long segment LEFT SFA occlusive disease with segmental occlusion
at the adductor hiatus. Popliteal reconstituted above the knee with
atheromatous tibial runoff.

## 2023-10-17 ENCOUNTER — Ambulatory Visit: Attending: Neurology | Admitting: Physical Therapy

## 2023-10-17 ENCOUNTER — Encounter: Payer: Self-pay | Admitting: Physical Therapy

## 2023-10-17 DIAGNOSIS — R2689 Other abnormalities of gait and mobility: Secondary | ICD-10-CM | POA: Insufficient documentation

## 2023-10-17 DIAGNOSIS — R202 Paresthesia of skin: Secondary | ICD-10-CM | POA: Insufficient documentation

## 2023-10-17 DIAGNOSIS — M6281 Muscle weakness (generalized): Secondary | ICD-10-CM | POA: Diagnosis not present

## 2023-10-17 DIAGNOSIS — R2681 Unsteadiness on feet: Secondary | ICD-10-CM | POA: Diagnosis not present

## 2023-10-17 NOTE — Therapy (Signed)
 OUTPATIENT PHYSICAL THERAPY NEURO EVALUATION   Patient Name: Luis Lindsey MRN: [id number] DOB:05-Apr-1951, 73 y.o., male Today's Date: 10/17/2023   PCP: Benedetto Brady, MD REFERRING PROVIDER: Wess Hammed, NP  END OF SESSION:  PT End of Session - 10/17/23 1924     Visit Number 1    Number of Visits 9    Date for PT Re-Evaluation 12/13/23    Authorization Type [employer]    Authorization Time Period 10-17-23 - 12-26-23    PT Start Time 1448    PT Stop Time 1537    PT Time Calculation (min) 49 min    Activity Tolerance Patient tolerated treatment well    Behavior During Therapy [facility] for tasks assessed/performed             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   Dysphagia    Esophageal dysmotility    GERD (gastroesophageal reflux disease)    Heart murmur    pt had recent echocardiogram   History of hiatal hernia    History of Holter monitoring 2009   History of kidney stones    HNP (herniated nucleus pulposus), lumbar    L4-5   HYPERCHOLESTEROLEMIA 11/30/2009   Hyperlipidemia    HYPERTENSION 11/30/2009   Kidney stones    Numbness and tingling    Peripheral vascular disease (HCC)    Right carotid bruit    SMOKER 11/30/2009   Stroke (HCC) 2012   URINARY CALCULUS 11/30/2009   Past Surgical History:  Procedure Laterality Date   ABDOMINAL AORTOGRAM W/LOWER EXTREMITY N/A 06/18/2018   Procedure: ABDOMINAL AORTOGRAM W/LOWER EXTREMITY;  Surgeon: Young Hensen, MD;  Location: [facility];  Service: Cardiovascular;  Laterality: N/A;   ABDOMINAL AORTOGRAM W/LOWER EXTREMITY N/A 12/02/2019   Procedure: ABDOMINAL AORTOGRAM W/LOWER EXTREMITY;  Surgeon: Young Hensen, MD;  Location: [facility];  Service: Cardiovascular;  Laterality: N/A;   ABDOMINAL AORTOGRAM  W/LOWER EXTREMITY N/A 03/23/2021   Procedure: ABDOMINAL AORTOGRAM W/LOWER EXTREMITY;  Surgeon: Young Hensen, MD;  Location: [facility];  Service: Cardiovascular;  Laterality: N/A;   AORTIC VALVE REPLACEMENT N/A 10/25/2021   Procedure: AORTIC VALVE REPLACEMENT (AVR)USING INSPIRIS RESILIA  AORTIC VALVE;  Surgeon: Hilarie Lovely, MD;  Location: [facility] OR;  Service: Open Heart Surgery;  Laterality: N/A;   arthroscopy of right shoulder Right    Jan 2019, June 2019-- done at [facility], Dr. Lucienne Ryder   BACK SURGERY     CATARACT EXTRACTION W/ INTRAOCULAR LENS  IMPLANT, BILATERAL     CORONARY ARTERY BYPASS GRAFT N/A 10/25/2021   Procedure: CORONARY ARTERY BYPASS GRAFTING (CABG) X 3 USING LEFT INTERNAL MAMMARY ARTERY AND RIGHT GREATER SAPHENOUS VEIN;  Surgeon: Hilarie Lovely, MD;  Location: [facility];  Service: Open Heart Surgery;  Laterality: N/A;   CYSTOSCOPY     ENDARTERECTOMY Right 08/24/2019   Procedure: ENDARTERECTOMY CAROTID;  Surgeon: Young Hensen, MD;  Location: [facility] OR;  Service: Vascular;  Laterality: Right;   ENDARTERECTOMY FEMORAL Left 06/05/2021   Procedure: ENDARTERECTOMY FEMORAL WITH PROFUNDAPLASTY;  Surgeon: Young Hensen, MD;  Location: [facility] OR;  Service: Vascular;  Laterality: Left;   ENDOVEIN HARVEST OF GREATER SAPHENOUS VEIN Right 10/25/2021  Procedure: ENDOVEIN HARVEST OF GREATER SAPHENOUS VEIN;  Surgeon: Hilarie Lovely, MD;  Location: [facility];  Service: Open Heart Surgery;  Laterality: Right;   FACIAL COSMETIC SURGERY  2014   FEMORAL-POPLITEAL BYPASS GRAFT Left 06/05/2021   Procedure: LEFT FEMORAL-POPLITEAL ARTERY BYPASS GRAFTING USING 6mm PROPATEN VASCULAR REMOVABLE RING GRAFT;  Surgeon: Young Hensen, MD;  Location: [facility];  Service: Vascular;  Laterality: Left;   FINGER SURGERY  2002   INGUINAL HERNIA REPAIR Left 02/01/2023   Procedure: LAPAROSCOPIC LEFT INGUINAL HERNIA REPAIR WITH MESH;  Surgeon: Junie Olds, MD;   Location: [facility] ORS;  Service: General;  Laterality: Left;  90   LEFT HEART CATH AND CORONARY ANGIOGRAPHY N/A 10/05/2021   Procedure: LEFT HEART CATH AND CORONARY ANGIOGRAPHY;  Surgeon: Sammy Crisp, MD;  [facility]: [facility];  Service: Cardiovascular;  Laterality: N/A;   LUMBAR LAMINECTOMY/DECOMPRESSION MICRODISCECTOMY Left 08/01/2018   Procedure: Microdiscectomy - Lumbar four-Lumbar five - left;  Surgeon: Isadora Mar, MD;  Location: [facility] OR;  Service: Neurosurgery;  Laterality: Left;   LUMBAR LAMINECTOMY/DECOMPRESSION MICRODISCECTOMY Left 10/15/2018   Procedure: Re-do Microdiscectomy - left - Lumbar four-Lumbar five;  Surgeon: Isadora Mar, MD;  Location: [facility] OR;  Service: Neurosurgery;  Laterality: Left;   NECK SURGERY  1986   PATCH ANGIOPLASTY Left 06/05/2021   Procedure: PATCH ANGIOPLASTY USING Corinna Dickens BIOLOGIC PATCH;  Surgeon: Young Hensen, MD;  Location: [facility] OR;  Service: Vascular;  Laterality: Left;   PERIPHERAL VASCULAR ATHERECTOMY  12/02/2019   Procedure: PERIPHERAL VASCULAR ATHERECTOMY;  Surgeon: Young Hensen, MD;  [facility];  Service: Cardiovascular;;  left SFA   PERIPHERAL VASCULAR INTERVENTION  06/18/2018   Procedure: PERIPHERAL VASCULAR INTERVENTION;  Surgeon: Young Hensen, MD;  [facility];  Service: Cardiovascular;;  Left external iliac   ROTATOR CUFF REPAIR Left    TEE WITHOUT CARDIOVERSION N/A 10/25/2021   Procedure: TRANSESOPHAGEAL ECHOCARDIOGRAM (TEE);  Surgeon: Hilarie Lovely, MD;  Location: [facility] OR;  Service: Open Heart Surgery;  Laterality: N/A;   UMBILICAL HERNIA REPAIR     Patient Active Problem List   Diagnosis Date Noted   Essential tremor 12/26/2022   Paresthesia 12/26/2022   Hypokalemia 03/01/2022   Hypoalbuminemia 03/01/2022   Abnormal LFTs 03/01/2022   Symptomatic anemia 02/27/2022   S/P CABG x 3 02/27/2022   Iron  deficiency anemia 02/27/2022   Complication of surgical procedure 02/13/2022    Degeneration of lumbar intervertebral disc 02/13/2022   S/P AVR (aortic valve replacement) 10/25/2021   Coronary artery disease of native artery of native heart with stable angina pectoris (HCC)    Abnormal cardiac CT angiography    PAD (peripheral artery disease) (HCC) 06/05/2021   S/P lumbar fusion 12/07/2020   Peripheral vascular disease (HCC) 04/06/2020   Cerebrovascular accident (CVA) (HCC) 04/06/2020   S/P carotid endarterectomy 04/06/2020   DM type 2 with diabetic peripheral neuropathy (HCC) 12/31/2019   Gait abnormality 12/31/2019   Tremor 12/07/2019   Carotid stenosis, asymptomatic, right 08/24/2019   Carotid disease, bilateral (HCC) 08/11/2019   S/P lumbar laminectomy 08/01/2018   PVD (peripheral vascular disease) (HCC) 06/10/2018   Left lumbar radiculopathy 04/01/2018   Peripheral neuropathy 04/01/2018   Left leg claudication (HCC) 04/01/2018   Status post arthroscopy of right shoulder 06/27/2017   Superior glenoid labrum lesion of right shoulder 06/10/2017   Chronic right shoulder pain 04/16/2017   Displaced fracture of proximal phalanx of right great toe with routine healing 03/13/2017  Impingement syndrome of right shoulder 03/13/2017   Displaced fracture of proximal phalanx of right great toe, initial encounter for closed fracture 02/20/2017   Diabetes (HCC) 08/24/2013   History of Holter monitoring    Heart murmur    TIA (transient ischemic attack) 12/26/2012   Kidney stones    DM 11/30/2009   HYPERCHOLESTEROLEMIA 11/30/2009   Essential hypertension 11/30/2009   URINARY CALCULUS 11/30/2009   CAROTID BRUIT, RIGHT 11/30/2009    ONSET DATE: CVA 2021:  Referral date 10-03-23  REFERRING DIAG: I63.9 (ICD-10-CM) - Cerebrovascular accident (CVA), unspecified mechanism (HCC) I73.9 (ICD-10-CM) - PAD (peripheral artery disease) (HCC) R26.9 (ICD-10-CM) - Gait abnormality  THERAPY DIAG:  Muscle weakness (generalized)  Other abnormalities of gait and  mobility  Unsteadiness on feet  Paresthesia of skin  Rationale for Evaluation and Treatment: Rehabilitation  SUBJECTIVE:                                                                                                                                                                                             SUBJECTIVE STATEMENT: Pt reports he received PT at this facility last fall; reports it helped some but not a whole lot. Pt reports his doctor recommended that he return to work on LLE strength and balance.   Uses SPC for prolonged ambulation. Pt reports he feels his mobility has decreased in past 3 months. Pt reports he has intermittent vertigo -  reports he gets dizzy if he tilts his head back to look up.  Has ankle brace for Lt leg but isn't wearing it today - just didn't put it back on after showering today.  Pt reports his Lt foot occasionally drags and at times his left foot will roll. Pt reports he obtained TENS unit as recommended during previous OP PT admission to help with neuropathy pain management but states it doesn't really help. Pt accompanied by: self  PERTINENT HISTORY: 1.  Vertigo 2.  Worsening gait abnormality 3.  Tremor 4.  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  History of stroke 6.  Right carotid artery endarterectomy March 2021 7.  Significant peripheral vascular disease (He has extensive history including left external iliac stenting for claudication on 06/18/2018. Later he had left SFA stenting for claudication on 12/02/2019. Ultimately he then had left common femoral endarterectomy with profundoplasty and bovine patch as well as left common femoral to below-knee popliteal bypass with 6 mm PTFE on 06/05/2021)  PAIN:  Are you having pain? Yes: NPRS scale: 6/10 with ambulation; at rest 1-2/10 Pain location: bil. Feet and Lt leg Pain description: pins & needles in  feet; achy  Aggravating factors: prolonged ambulation makes pain in Lt leg worse Relieving  factors: sitting  PRECAUTIONS: Fall  RED FLAGS: None   WEIGHT BEARING RESTRICTIONS: No  FALLS: Has patient fallen in last 6 months? Yes. Number of falls 1 - fell backward and hit kitchen countertops  LIVING ENVIRONMENT: Lives with: lives with their spouse Lives in: House/apartment Stairs: Yes: External: 2 steps; none Has following equipment at home: Single point cane  PLOF: Independent with basic ADLs, Independent with household mobility without device, and Independent with community mobility with device  PATIENT GOALS: Improve balance & LLE strength  OBJECTIVE:  Note: Objective measures were completed at Evaluation unless otherwise noted.  DIAGNOSTIC FINDINGS: IMPRESSION: This MR angiogram of the neck arteries with and without contrast shows the following: There is 75 to 80% stenosis of the left internal carotid artery at the junction of the lacerum and cavernous segments.  This could be hemodynamically significant.  More proximally, there is mild to moderate stenosis (50%) at the origin of the left common carotid artery and mild stenosis near the origin of the left internal carotid artery that would not be hemodynamically significant. There is mild stenosis at the origin of the right common carotid artery and moderate stenosis more distally in the common carotid artery before the bifurcation.  No significant stenosis is noted in the right internal carotid artery. There is mild stenosis at the origin of the right vertebral artery that would not be hemodynamically significant. There is mild stenosis at the origin of the left vertebral artery and patchy mild to moderate the gnosis in the cervical, V3 and V4 segment of the left vertebral artery.  This is probably not hemodynamically significant.  IMPRESSION: This MR angiogram of the intracranial arteries shows the following: 80% stenosis of the left internal carotid artery at the junction of the lacerum and cavernous segments.  This  could be hemodynamically significant. Minimal stenosis within the V4 segment of the left vertebral artery.  This is unlikely to be hemodynamically significant.   COGNITION: Overall cognitive status: Within functional limits for tasks assessed   SENSATION: Numbness in Lt foot; pins and needles in Rt foot - sometimes burning sensation  COORDINATION: NT  EDEMA:  Pt wearing compression stockings bil. LE's - > edema in LLE than in RLE  POSTURE: No Significant postural limitations  LOWER EXTREMITY ROM:   WFL's bil. LE's  LOWER EXTREMITY MMT:    MMT Right Eval Left Eval  Hip flexion 4 3+  Hip extension 3 3-  Hip abduction    Hip adduction    Hip internal rotation    Hip external rotation    Knee flexion 4 3+  Knee extension 5 4  Ankle dorsiflexion 5 4-  Ankle plantarflexion    Ankle inversion    Ankle eversion    (Blank rows = not tested)  BED MOBILITY:  Findings: Indepedent  TRANSFERS: Sit to stand: Modified independence  Assistive device utilized: bil. UE support      GAIT: Findings: Gait Characteristics: step through pattern and decreased ankle dorsiflexion- Left, Distance walked: 50', Assistive device utilized:None, Level of assistance: Modified independence, and Comments: decreased push off bil. LE's due to bil. Plantarflexor weakness  FUNCTIONAL TESTS:  5 times sit to stand: 21.16 secs with bil. UE support from chair Timed up and go (TUG): 12.63 secs without device 10 meter walk test: 11.62 secs = 2.82 ft/sec without device  Lt Dix-Hallpike - no nystagmus noted but pt reported dizziness  in test position                                                                                                                              TREATMENT DATE: 10-17-23    PATIENT EDUCATION: Education details: eval results with comparison of today's scores with previous scores (Aug. 2024); discussed aquatic PT - pt states this would not be something he would continue after D/C  from PT so decision was made to do land-based PT only; reviewed previous HEP which pt states he does approx. 3x/week; discussed possible need for AFO for LLE Person educated: Patient Education method: Explanation Education comprehension: verbalized understanding  HOME EXERCISE PROGRAM: To be established  GOALS: Goals reviewed with patient? Yes  SHORT TERM GOALS: Target date: 11-15-23  Perform DGA and establish LTG. Baseline: Goal status: INITIAL  2.  Improve 5x sit to stand to </= 18 secs with bil. UE support from chair. Baseline: 21.16 secs Goal status: INITIAL  3.  Increase gait velocity to >/= 3.0 ft/sec without device for increased gait efficiency. Baseline: 11.62 secs = 2.82 ft/sec without device  Goal status: INITIAL  4.  Trial of AFO for LLE for increased safety with ambulation. Baseline:  Goal status: INITIAL  5.  Independent in HEP for LLE strengthening and balance exercises. Baseline:  Goal status: INITIAL    LONG TERM GOALS: Target date: 12-13-23  Increase DGI score by at least 3 points for increased safety with gait and for reduced fall risk. Baseline:  Goal status: INITIAL  2.  Improve 5x sit to stand to </= 16 secs with bil. UE support from chair. Baseline: 21.16 secs Goal status: INITIAL  3.  Increase gait velocity to >/= 3.2 ft/sec without device for increased gait efficiency. Baseline: 11.62 secs = 2.82 ft/sec without device  Goal status: INITIAL  4.  Pt will have (-) Lt Dix-Hallpike test with no c/o vertigo to indicate resolution of Lt BPPV. Baseline:  Goal status: INITIAL  5.  Obtain AFO for LLE if determined to be beneficial in improving gait. Baseline:  Goal status: INITIAL  6.  Independent in updated HEP as appropriate for balance and LLE strengthening. Baseline:  Goal status: INITIAL  ASSESSMENT:  CLINICAL IMPRESSION: Patient is a 73 y.o. gentleman who was seen today for physical therapy evaluation and treatment for LLE weakness and  balance impairments due to peripheral neuropathy and h/o CVA.  Pt is currently ambulating modified independently without device.  Pt reports paresthesias in bil. LE's with LLE>RLE.  Pt has weakness in LLE including hip, knee and ankle musculature.  Pt reports he has an ankle brace but is not wearing it today.  Pt is unable to perform sit to stand without bil. UE support from mat and has decreased gait velocity of 2.82 ft/sec without use of device.  Pt reports he has intermittent vertigo with increased dizziness experienced today.  Pt's symptoms are consistent with Lt BPPV but no  nystagmus was noted in Dix-Hallpike test position, however, pt reported dizziness in test position.  Pt is unable to amb. Prolonged distances due to PVD in LLE.  Pt will benefit from PT to address LLE weakness, gait and balance deficits.    OBJECTIVE IMPAIRMENTS: decreased activity tolerance, decreased balance, difficulty walking, decreased strength, dizziness, increased edema, impaired sensation, and pain.   ACTIVITY LIMITATIONS: bending, standing, squatting, stairs, transfers, and locomotion level  PARTICIPATION LIMITATIONS: meal prep, cleaning, laundry, shopping, community activity, and yard work  PERSONAL FACTORS: Past/current experiences, Time since onset of injury/illness/exacerbation, and 1-2 comorbidities: h/o CVA, neuropathy and PVD in LLE are also affecting patient's functional outcome.   REHAB POTENTIAL: Good  CLINICAL DECISION MAKING: Evolving/moderate complexity  EVALUATION COMPLEXITY: Moderate  PLAN:  PT FREQUENCY: 1x/week  PT DURATION: 8 weeks + eval = 9 sessions  PLANNED INTERVENTIONS: 97110-Therapeutic exercises, 97530- Therapeutic activity, [id number]- Neuromuscular re-education, [phone]- Self Care, [id number]- Gait training, and [phone]- Canalith repositioning  PLAN FOR NEXT SESSION: recheck Lt BPPV:  HEP for LLE strengthening; do DGI   Anapaola Kinsel, Celeste Cola, PT 10/17/2023, 7:33 PM

## 2023-10-24 ENCOUNTER — Ambulatory Visit: Admitting: Physical Therapy

## 2023-10-24 DIAGNOSIS — R202 Paresthesia of skin: Secondary | ICD-10-CM | POA: Diagnosis not present

## 2023-10-24 DIAGNOSIS — R2681 Unsteadiness on feet: Secondary | ICD-10-CM

## 2023-10-24 DIAGNOSIS — R2689 Other abnormalities of gait and mobility: Secondary | ICD-10-CM | POA: Diagnosis not present

## 2023-10-24 DIAGNOSIS — M6281 Muscle weakness (generalized): Secondary | ICD-10-CM | POA: Diagnosis not present

## 2023-10-24 NOTE — Therapy (Unsigned)
 OUTPATIENT PHYSICAL THERAPY NEURO EVALUATION   Patient Name: Luis Lindsey MRN: [id number] DOB:07-10-1950, 73 y.o., male Today's Date: 10/25/2023   PCP: Benedetto Brady, MD REFERRING PROVIDER: Wess Hammed, NP  END OF SESSION:  PT End of Session - 10/25/23 1323     Visit Number 2    Number of Visits 9    Date for PT Re-Evaluation 12/13/23    Authorization Type [employer]    Authorization Time Period 10-17-23 - 12-26-23    PT Start Time 1102    PT Stop Time 1147    PT Time Calculation (min) 45 min    Equipment Utilized During Treatment Gait belt   Ottobock walk on AFO for LLE   Activity Tolerance Patient tolerated treatment well    Behavior During Therapy WFL for tasks assessed/performed              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   Dysphagia    Esophageal dysmotility    GERD (gastroesophageal reflux disease)    Heart murmur    pt had recent echocardiogram   History of hiatal hernia    History of Holter monitoring 2009   History of kidney stones    HNP (herniated nucleus pulposus), lumbar    L4-5   HYPERCHOLESTEROLEMIA 11/30/2009   Hyperlipidemia    HYPERTENSION 11/30/2009   Kidney stones    Numbness and tingling    Peripheral vascular disease (HCC)    Right carotid bruit    SMOKER 11/30/2009   Stroke (HCC) 2012   URINARY CALCULUS 11/30/2009   Past Surgical History:  Procedure Laterality Date   ABDOMINAL AORTOGRAM W/LOWER EXTREMITY N/A 06/18/2018   Procedure: ABDOMINAL AORTOGRAM W/LOWER EXTREMITY;  Surgeon: Young Hensen, MD;  Location: [facility];  Service: Cardiovascular;  Laterality: N/A;   ABDOMINAL AORTOGRAM W/LOWER EXTREMITY N/A 12/02/2019   Procedure: ABDOMINAL AORTOGRAM W/LOWER EXTREMITY;  Surgeon: Young Hensen, MD;  Location: [facility];  Service: Cardiovascular;  Laterality: N/A;   ABDOMINAL AORTOGRAM W/LOWER EXTREMITY N/A 03/23/2021   Procedure: ABDOMINAL AORTOGRAM W/LOWER EXTREMITY;  Surgeon: Young Hensen, MD;  Location: [facility];  Service: Cardiovascular;  Laterality: N/A;   AORTIC VALVE REPLACEMENT N/A 10/25/2021   Procedure: AORTIC VALVE REPLACEMENT (AVR)USING INSPIRIS RESILIA  AORTIC VALVE;  Surgeon: Hilarie Lovely, MD;  Location: [facility];  Service: Open Heart Surgery;  Laterality: N/A;   arthroscopy of right shoulder Right    Jan 2019, June 2019-- done at [facility], Dr. Lucienne Ryder   BACK SURGERY     CATARACT EXTRACTION W/ INTRAOCULAR LENS  IMPLANT, BILATERAL     CORONARY ARTERY BYPASS GRAFT N/A 10/25/2021   Procedure: CORONARY ARTERY BYPASS GRAFTING (CABG) X 3 USING LEFT INTERNAL MAMMARY ARTERY AND RIGHT GREATER SAPHENOUS VEIN;  Surgeon: Hilarie Lovely, MD;  Location: [facility];  Service: Open Heart Surgery;  Laterality: N/A;   CYSTOSCOPY     ENDARTERECTOMY Right 08/24/2019   Procedure: ENDARTERECTOMY CAROTID;  Surgeon: Young Hensen, MD;  Location: [facility] OR;  Service: Vascular;  Laterality: Right;   ENDARTERECTOMY FEMORAL Left 06/05/2021   Procedure: ENDARTERECTOMY FEMORAL WITH PROFUNDAPLASTY;  Surgeon: Young Hensen, MD;  Location: [facility]  OR;  Service: Vascular;  Laterality: Left;   ENDOVEIN HARVEST OF GREATER SAPHENOUS VEIN Right 10/25/2021   Procedure: ENDOVEIN HARVEST OF GREATER SAPHENOUS VEIN;  Surgeon: Hilarie Lovely, MD;  Location: [facility];  Service: Open Heart Surgery;  Laterality: Right;   FACIAL COSMETIC SURGERY  2014   FEMORAL-POPLITEAL BYPASS GRAFT Left 06/05/2021   Procedure: LEFT FEMORAL-POPLITEAL ARTERY BYPASS GRAFTING USING 6mm PROPATEN VASCULAR REMOVABLE RING GRAFT;  Surgeon: Young Hensen, MD;  Location: [facility];  Service: Vascular;  Laterality: Left;   FINGER SURGERY  2002   INGUINAL HERNIA REPAIR Left 02/01/2023   Procedure:  LAPAROSCOPIC LEFT INGUINAL HERNIA REPAIR WITH MESH;  Surgeon: Junie Olds, MD;  Location: [facility];  Service: General;  Laterality: Left;  90   LEFT HEART CATH AND CORONARY ANGIOGRAPHY N/A 10/05/2021   Procedure: LEFT HEART CATH AND CORONARY ANGIOGRAPHY;  Surgeon: Sammy Crisp, MD;  [facility]: [facility];  Service: Cardiovascular;  Laterality: N/A;   LUMBAR LAMINECTOMY/DECOMPRESSION MICRODISCECTOMY Left 08/01/2018   Procedure: Microdiscectomy - Lumbar four-Lumbar five - left;  Surgeon: Isadora Mar, MD;  Location: [facility] OR;  Service: Neurosurgery;  Laterality: Left;   LUMBAR LAMINECTOMY/DECOMPRESSION MICRODISCECTOMY Left 10/15/2018   Procedure: Re-do Microdiscectomy - left - Lumbar four-Lumbar five;  Surgeon: Isadora Mar, MD;  Location: [facility] OR;  Service: Neurosurgery;  Laterality: Left;   NECK SURGERY  1986   PATCH ANGIOPLASTY Left 06/05/2021   Procedure: PATCH ANGIOPLASTY USING Corinna Dickens BIOLOGIC PATCH;  Surgeon: Young Hensen, MD;  Location: [facility] OR;  Service: Vascular;  Laterality: Left;   PERIPHERAL VASCULAR ATHERECTOMY  12/02/2019   Procedure: PERIPHERAL VASCULAR ATHERECTOMY;  Surgeon: Young Hensen, MD;  [facility]: [facility];  Service: Cardiovascular;;  left SFA   PERIPHERAL VASCULAR INTERVENTION  06/18/2018   Procedure: PERIPHERAL VASCULAR INTERVENTION;  Surgeon: Young Hensen, MD;  [facility]: [facility];  Service: Cardiovascular;;  Left external iliac   ROTATOR CUFF REPAIR Left    TEE WITHOUT CARDIOVERSION N/A 10/25/2021   Procedure: TRANSESOPHAGEAL ECHOCARDIOGRAM (TEE);  Surgeon: Hilarie Lovely, MD;  Location: [facility] OR;  Service: Open Heart Surgery;  Laterality: N/A;   UMBILICAL HERNIA REPAIR     Patient Active Problem List   Diagnosis Date Noted   Essential tremor 12/26/2022   Paresthesia 12/26/2022   Hypokalemia 03/01/2022   Hypoalbuminemia 03/01/2022   Abnormal LFTs 03/01/2022   Symptomatic anemia 02/27/2022   S/P CABG x 3 02/27/2022    Iron  deficiency anemia 02/27/2022   Complication of surgical procedure 02/13/2022   Degeneration of lumbar intervertebral disc 02/13/2022   S/P AVR (aortic valve replacement) 10/25/2021   Coronary artery disease of native artery of native heart with stable angina pectoris (HCC)    Abnormal cardiac CT angiography    PAD (peripheral artery disease) (HCC) 06/05/2021   S/P lumbar fusion 12/07/2020   Peripheral vascular disease (HCC) 04/06/2020   Cerebrovascular accident (CVA) (HCC) 04/06/2020   S/P carotid endarterectomy 04/06/2020   DM type 2 with diabetic peripheral neuropathy (HCC) 12/31/2019   Gait abnormality 12/31/2019   Tremor 12/07/2019   Carotid stenosis, asymptomatic, right 08/24/2019   Carotid disease, bilateral (HCC) 08/11/2019   S/P lumbar laminectomy 08/01/2018   PVD (peripheral vascular disease) (HCC) 06/10/2018   Left lumbar radiculopathy 04/01/2018   Peripheral neuropathy 04/01/2018   Left leg claudication (HCC) 04/01/2018   Status post arthroscopy of right shoulder 06/27/2017   Superior glenoid labrum lesion of right shoulder 06/10/2017   Chronic  right shoulder pain 04/16/2017   Displaced fracture of proximal phalanx of right great toe with routine healing 03/13/2017   Impingement syndrome of right shoulder 03/13/2017   Displaced fracture of proximal phalanx of right great toe, initial encounter for closed fracture 02/20/2017   Diabetes (HCC) 08/24/2013   History of Holter monitoring    Heart murmur    TIA (transient ischemic attack) 12/26/2012   Kidney stones    DM 11/30/2009   HYPERCHOLESTEROLEMIA 11/30/2009   Essential hypertension 11/30/2009   URINARY CALCULUS 11/30/2009   CAROTID BRUIT, RIGHT 11/30/2009    ONSET DATE: CVA 2021:  Referral date 10-03-23  REFERRING DIAG: I63.9 (ICD-10-CM) - Cerebrovascular accident (CVA), unspecified mechanism (HCC) I73.9 (ICD-10-CM) - PAD (peripheral artery disease) (HCC) R26.9 (ICD-10-CM) - Gait abnormality  THERAPY DIAG:   Muscle weakness (generalized)  Other abnormalities of gait and mobility  Unsteadiness on feet  Rationale for Evaluation and Treatment: Rehabilitation  SUBJECTIVE:                                                                                                                                                                                             SUBJECTIVE STATEMENT: Pt reports the dizziness was improved after treatment (Lt Epley maneuver) for Lt BPPV was done in previous session during initial PT eval.  Pt states he does continue to have some dizziness when he looks up.  Pt states he still does some of the exercises given to him during previous admission (last fall)  Pt accompanied by: self  PERTINENT HISTORY: 1.  Vertigo 2.  Worsening gait abnormality 3.  Tremor 4.  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  History of stroke 6.  Right carotid artery endarterectomy March 2021 7.  Significant peripheral vascular disease (He has extensive history including left external iliac stenting for claudication on 06/18/2018. Later he had left SFA stenting for claudication on 12/02/2019. Ultimately he then had left common femoral endarterectomy with profundoplasty and bovine patch as well as left common femoral to below-knee popliteal bypass with 6 mm PTFE on 06/05/2021)  PAIN:  Are you having pain? Yes: NPRS scale: 6/10 with ambulation; at rest 1-2/10 Pain location: bil. Feet and Lt leg Pain description: pins & needles in feet; achy  Aggravating factors: prolonged ambulation makes pain in Lt leg worse Relieving factors: sitting  PRECAUTIONS: Fall  RED FLAGS: None   WEIGHT BEARING RESTRICTIONS: No  FALLS: Has patient fallen in last 6 months? Yes. Number of falls 1 - fell backward and hit kitchen countertops  LIVING ENVIRONMENT: Lives with: lives with their spouse Lives in: House/apartment Stairs: Yes:  External: 2 steps; none Has following equipment at home: Single point  cane  PLOF: Independent with basic ADLs, Independent with household mobility without device, and Independent with community mobility with device  PATIENT GOALS: Improve balance & LLE strength  OBJECTIVE:  Note: Objective measures were completed at Evaluation unless otherwise noted.  DIAGNOSTIC FINDINGS: IMPRESSION: This MR angiogram of the neck arteries with and without contrast shows the following: There is 75 to 80% stenosis of the left internal carotid artery at the junction of the lacerum and cavernous segments.  This could be hemodynamically significant.  More proximally, there is mild to moderate stenosis (50%) at the origin of the left common carotid artery and mild stenosis near the origin of the left internal carotid artery that would not be hemodynamically significant. There is mild stenosis at the origin of the right common carotid artery and moderate stenosis more distally in the common carotid artery before the bifurcation.  No significant stenosis is noted in the right internal carotid artery. There is mild stenosis at the origin of the right vertebral artery that would not be hemodynamically significant. There is mild stenosis at the origin of the left vertebral artery and patchy mild to moderate the gnosis in the cervical, V3 and V4 segment of the left vertebral artery.  This is probably not hemodynamically significant.  IMPRESSION: This MR angiogram of the intracranial arteries shows the following: 80% stenosis of the left internal carotid artery at the junction of the lacerum and cavernous segments.  This could be hemodynamically significant. Minimal stenosis within the V4 segment of the left vertebral artery.  This is unlikely to be hemodynamically significant.   COGNITION: Overall cognitive status: Within functional limits for tasks assessed   SENSATION: Numbness in Lt foot; pins and needles in Rt foot - sometimes burning sensation  COORDINATION: NT  EDEMA:  Pt  wearing compression stockings bil. LE's - > edema in LLE than in RLE  POSTURE: No Significant postural limitations  LOWER EXTREMITY ROM:   WFL's bil. LE's  LOWER EXTREMITY MMT:    MMT Right Eval Left Eval  Hip flexion 4 3+  Hip extension 3 3-  Hip abduction    Hip adduction    Hip internal rotation    Hip external rotation    Knee flexion 4 3+  Knee extension 5 4  Ankle dorsiflexion 5 4-  Ankle plantarflexion    Ankle inversion    Ankle eversion    (Blank rows = not tested)  BED MOBILITY:  Findings: Indepedent  TRANSFERS: Sit to stand: Modified independence  Assistive device utilized: bil. UE support      GAIT: Findings: Gait Characteristics: step through pattern and decreased ankle dorsiflexion- Left, Distance walked: 50', Assistive device utilized:None, Level of assistance: Modified independence, and Comments: decreased push off bil. LE's due to bil. Plantarflexor weakness  FUNCTIONAL TESTS:  5 times sit to stand: 21.16 secs with bil. UE support from chair Timed up and go (TUG): 12.63 secs without device 10 meter walk test: 11.62 secs = 2.82 ft/sec without device ;   Lt Dix-Hallpike - no nystagmus noted but pt reported dizziness in test position  TREATMENT DATE: 10-24-23   NeuroRe-ed:  Lt Dix-Hallpike test (-) with no rotary nystagmus and no c/o spinning vertigo in test position; pt reported no major dizziness with return to upright sitting; in seated position, pt performed cervical extension with looking up at ceiling - reported no dizziness provoked with this movement as typically experienced.  No canalith repositioning performed due to no signs of Lt BPPV in today's session  Pt performed standing balance exercises for HEP - at counter - with UE support prn for safety;  standing forward, back, and side kicks 10 reps alternating LE's for improved  weight shift and SLS on each leg;  SLS on each leg for 10 secs - with UE support prn  Gait: Pt gait trained with Lt Ottobock Walk on Trimable on LLE;  115' x 2 reps without device with SBA;  pt reports improvement in gait with less concentration required for lifting Lt foot to ensure clearance in swing phase of gait.  Pt reported brace was comfortable - no discomfort/pain reported with wearing  AFO on LLE  Step training - with Lt AFO - pt negotiated 4 steps with use of bil. Hand rails using step over step sequence with supervision Ramp training - SBA -with use of Lt AFO on LLE - pt had no LOB or difficulty negotiating ramp  TherEx:  Bridging 5 reps;  Lt unilateral bridging 10 reps:  Lt SLR with 2# weight 10 reps:  Lt hip extension control exercise with 2# off side of mat 10 reps;  Lt hip abduction in Rt sidelying 10 reps - no weight:  Lt clam shell with 2# weight 10 reps in Rt sidelying position      Leg press - bil. LE's - 60# 10 reps:             LLE only - 40# 10 reps     PATIENT EDUCATION: Education details: educated pt in purpose and benefit of AFO - pt agrees with this recommendation and requests to obtain this orthosis as he feels it significantly improves his gait.  Requests that message be sent to PCP in order to schedule face to face visit earlier with this physician, as next appt with neurologist isn't scheduled until Nov. 2025.  Also instructed pt in HEP for balance exercises & strengthening LLE. Person educated: Patient Education method: Explanation, Demonstration, and Handouts Education comprehension: verbalized understanding and returned demonstration  HOME EXERCISE PROGRAM: To be established  GOALS: Goals reviewed with patient? Yes  SHORT TERM GOALS: Target date: 11-15-23  Perform DGA and establish LTG. Baseline: Goal status: INITIAL  2.  Improve 5x sit to stand to </= 18 secs with bil. UE support from chair. Baseline: 21.16 secs Goal status: INITIAL  3.   Increase gait velocity to >/= 3.0 ft/sec without device for increased gait efficiency. Baseline: 11.62 secs = 2.82 ft/sec without device  Goal status: INITIAL  4.  Trial of AFO for LLE for increased safety with ambulation. Baseline:  Goal status: INITIAL  5.  Independent in HEP for LLE strengthening and balance exercises. Baseline:  Goal status: INITIAL    LONG TERM GOALS: Target date: 12-13-23  Increase DGI score by at least 3 points for increased safety with gait and for reduced fall risk. Baseline:  Goal status: INITIAL  2.  Improve 5x sit to stand to </= 16 secs with bil. UE support from chair. Baseline: 21.16 secs Goal status: INITIAL  3.  Increase gait velocity to >/= 3.2 ft/sec without  device for increased gait efficiency. Baseline: 11.62 secs = 2.82 ft/sec without device  Goal status: INITIAL  4.  Pt will have (-) Lt Dix-Hallpike test with no c/o vertigo to indicate resolution of Lt BPPV. Baseline:  Goal status: INITIAL  5.  Obtain AFO for LLE if determined to be beneficial in improving gait. Baseline:  Goal status: INITIAL  6.  Independent in updated HEP as appropriate for balance and LLE strengthening. Baseline:  Goal status: INITIAL  ASSESSMENT:  CLINICAL IMPRESSION: PT session focused on assessment of Lt BPPV and gait training with trial of Lt Ottobock walk on AFO for LLE. Pt had (-) Lt Dix-Hallpike test with no nystagmus and no c/o vertigo in test position.  Pt able to perform cervical extension (looking up at ceiling) and no vertigo provoked with this movement, indicative of Lt BPPV resolution at this time.  Lt Ottobock AFO was very beneficial in increasing Lt foot clearance and thereby, safety with ambulation.  Pt reports he would like to obtain this orthosis as he felt more confident and safe with ambulation.  Pt requested message be sent to PCP for order and then he will schedule a face to face visit for authorization for this orthosis.  Cont with POC.    OBJECTIVE IMPAIRMENTS: decreased activity tolerance, decreased balance, difficulty walking, decreased strength, dizziness, increased edema, impaired sensation, and pain.   ACTIVITY LIMITATIONS: bending, standing, squatting, stairs, transfers, and locomotion level  PARTICIPATION LIMITATIONS: meal prep, cleaning, laundry, shopping, community activity, and yard work  PERSONAL FACTORS: Past/current experiences, Time since onset of injury/illness/exacerbation, and 1-2 comorbidities: h/o CVA, neuropathy and PVD in LLE are also affecting patient's functional outcome.   REHAB POTENTIAL: Good  CLINICAL DECISION MAKING: Evolving/moderate complexity  EVALUATION COMPLEXITY: Moderate  PLAN:  PT FREQUENCY: 1x/week  PT DURATION: 8 weeks + eval = 9 sessions  PLANNED INTERVENTIONS: 97110-Therapeutic exercises, 97530- Therapeutic activity, [id number]- Neuromuscular re-education, [phone]- Self Care, [id number]- Gait training, and [phone]- Canalith repositioning  PLAN FOR NEXT SESSION: check HEP for LLE strengthening; do DGI   Makyle Eslick, Celeste Cola, PT 10/25/2023, 1:25 PM

## 2023-10-25 ENCOUNTER — Encounter: Payer: Self-pay | Admitting: Physical Therapy

## 2023-10-25 ENCOUNTER — Telehealth: Payer: Self-pay | Admitting: Physical Therapy

## 2023-10-25 NOTE — Telephone Encounter (Signed)
 Dr. Ivey Marlin, Luis Lindsey is being seen at [facility] for OP PT to address gait and balance impairments.  We have trialed a Lt AFO and have determined that this orthosis would be very beneficial for him in increasing Lt foot clearance and safety with gait.  He will need an order for a Lt AFO - Ottobock Walk on and will also need a face to face visit to document need for this orthosis.  He is not scheduled to return to neurologist until Nov. 2025 and therefore, is requesting that you assist him in this process.  If you agree, would you please place an order in Epic (under "other orders") for a Left Ottobock Walk on AFO) and we will assist him in obtaining this orthosis.  He has been instructed to schedule a face to face visit with you if you agree with this recommendation.  Thank you, Barb Bonito, PT

## 2023-10-26 DIAGNOSIS — I10 Essential (primary) hypertension: Secondary | ICD-10-CM | POA: Diagnosis not present

## 2023-10-26 DIAGNOSIS — I25118 Atherosclerotic heart disease of native coronary artery with other forms of angina pectoris: Secondary | ICD-10-CM | POA: Diagnosis not present

## 2023-10-26 DIAGNOSIS — E782 Mixed hyperlipidemia: Secondary | ICD-10-CM | POA: Diagnosis not present

## 2023-10-26 DIAGNOSIS — E119 Type 2 diabetes mellitus without complications: Secondary | ICD-10-CM | POA: Diagnosis not present

## 2023-10-29 ENCOUNTER — Ambulatory Visit: Attending: Neurology | Admitting: Physical Therapy

## 2023-10-29 DIAGNOSIS — R2681 Unsteadiness on feet: Secondary | ICD-10-CM

## 2023-10-29 DIAGNOSIS — R2689 Other abnormalities of gait and mobility: Secondary | ICD-10-CM

## 2023-10-29 DIAGNOSIS — M6281 Muscle weakness (generalized): Secondary | ICD-10-CM

## 2023-10-29 NOTE — Therapy (Addendum)
 OUTPATIENT PHYSICAL THERAPY NEURO TREATMENT NOTE   Patient Name: Luis Lindsey MRN: [id number] DOB:08-Dec-1950, 73 y.o., male Today's Date: 11/01/2023   PCP: Benedetto Brady, MD REFERRING PROVIDER: Wess Hammed, NP  END OF SESSION:  PT End of Session - 11/01/23 1547     Visit Number 3    Number of Visits 9    Date for PT Re-Evaluation 12/13/23    Authorization Type [employer]    Authorization Time Period 10-17-23 - 12-26-23    PT Start Time 1535    PT Stop Time 1618    PT Time Calculation (min) 43 min    Equipment Utilized During Treatment Gait belt   Ottobock walk on AFO for LLE   Activity Tolerance Patient tolerated treatment well    Behavior During Therapy WFL for tasks assessed/performed               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   Dysphagia    Esophageal dysmotility    GERD (gastroesophageal reflux disease)    Heart murmur    pt had recent echocardiogram   History of hiatal hernia    History of Holter monitoring 2009   History of kidney stones    HNP (herniated nucleus pulposus), lumbar    L4-5   HYPERCHOLESTEROLEMIA 11/30/2009   Hyperlipidemia    HYPERTENSION 11/30/2009   Kidney stones    Numbness and tingling    Peripheral vascular disease (HCC)    Right carotid bruit    SMOKER 11/30/2009   Stroke (HCC) 2012   URINARY CALCULUS 11/30/2009   Past Surgical History:  Procedure Laterality Date   ABDOMINAL AORTOGRAM W/LOWER EXTREMITY N/A 06/18/2018   Procedure: ABDOMINAL AORTOGRAM W/LOWER EXTREMITY;  Surgeon: Young Hensen, MD;  Location: [facility];  Service: Cardiovascular;  Laterality: N/A;   ABDOMINAL AORTOGRAM W/LOWER EXTREMITY N/A 12/02/2019   Procedure: ABDOMINAL AORTOGRAM W/LOWER EXTREMITY;  Surgeon: Young Hensen, MD;  Location: [facility];  Service: Cardiovascular;  Laterality: N/A;   ABDOMINAL AORTOGRAM W/LOWER EXTREMITY N/A 03/23/2021   Procedure: ABDOMINAL AORTOGRAM W/LOWER EXTREMITY;  Surgeon: Young Hensen, MD;  Location: [facility];  Service: Cardiovascular;  Laterality: N/A;   AORTIC VALVE REPLACEMENT N/A 10/25/2021   Procedure: AORTIC VALVE REPLACEMENT (AVR)USING INSPIRIS RESILIA  AORTIC VALVE;  Surgeon: Hilarie Lovely, MD;  Location: [facility];  Service: Open Heart Surgery;  Laterality: N/A;   arthroscopy of right shoulder Right    Jan 2019, June 2019-- done at [facility], Dr. Lucienne Ryder   BACK SURGERY     CATARACT EXTRACTION W/ INTRAOCULAR LENS  IMPLANT, BILATERAL     CORONARY ARTERY BYPASS GRAFT N/A 10/25/2021   Procedure: CORONARY ARTERY BYPASS GRAFTING (CABG) X 3 USING LEFT INTERNAL MAMMARY ARTERY AND RIGHT GREATER SAPHENOUS VEIN;  Surgeon: Hilarie Lovely, MD;  Location: [facility];  Service: Open Heart Surgery;  Laterality: N/A;   CYSTOSCOPY     ENDARTERECTOMY Right 08/24/2019   Procedure: ENDARTERECTOMY CAROTID;  Surgeon: Young Hensen, MD;  Location: [facility] OR;  Service: Vascular;  Laterality: Right;   ENDARTERECTOMY FEMORAL Left 06/05/2021   Procedure: ENDARTERECTOMY FEMORAL WITH PROFUNDAPLASTY;  Surgeon: Young Hensen, MD;  Location: [facility];  Service: Vascular;  Laterality: Left;   ENDOVEIN HARVEST OF GREATER SAPHENOUS VEIN Right 10/25/2021   Procedure: ENDOVEIN HARVEST OF GREATER SAPHENOUS VEIN;  Surgeon: Hilarie Lovely, MD;  Location: [facility];  Service: Open Heart Surgery;  Laterality: Right;   FACIAL COSMETIC SURGERY  2014   FEMORAL-POPLITEAL BYPASS GRAFT Left 06/05/2021   Procedure: LEFT FEMORAL-POPLITEAL ARTERY BYPASS GRAFTING USING 6mm PROPATEN VASCULAR REMOVABLE RING GRAFT;  Surgeon: Young Hensen, MD;  Location: [facility];  Service: Vascular;  Laterality: Left;   FINGER SURGERY  2002   INGUINAL HERNIA REPAIR Left 02/01/2023   Procedure:  LAPAROSCOPIC LEFT INGUINAL HERNIA REPAIR WITH MESH;  Surgeon: Junie Olds, MD;  Location: [facility];  Service: General;  Laterality: Left;  90   LEFT HEART CATH AND CORONARY ANGIOGRAPHY N/A 10/05/2021   Procedure: LEFT HEART CATH AND CORONARY ANGIOGRAPHY;  Surgeon: Sammy Crisp, MD;  Location: [facility];  Service: Cardiovascular;  Laterality: N/A;   LUMBAR LAMINECTOMY/DECOMPRESSION MICRODISCECTOMY Left 08/01/2018   Procedure: Microdiscectomy - Lumbar four-Lumbar five - left;  Surgeon: Isadora Mar, MD;  Location: [facility] OR;  Service: Neurosurgery;  Laterality: Left;   LUMBAR LAMINECTOMY/DECOMPRESSION MICRODISCECTOMY Left 10/15/2018   Procedure: Re-do Microdiscectomy - left - Lumbar four-Lumbar five;  Surgeon: Isadora Mar, MD;  Location: [facility] OR;  Service: Neurosurgery;  Laterality: Left;   NECK SURGERY  1986   PATCH ANGIOPLASTY Left 06/05/2021   Procedure: PATCH ANGIOPLASTY USING Corinna Dickens BIOLOGIC PATCH;  Surgeon: Young Hensen, MD;  Location: [facility] OR;  Service: Vascular;  Laterality: Left;   PERIPHERAL VASCULAR ATHERECTOMY  12/02/2019   Procedure: PERIPHERAL VASCULAR ATHERECTOMY;  Surgeon: Young Hensen, MD;  [facility]: [facility];  Service: Cardiovascular;;  left SFA   PERIPHERAL VASCULAR INTERVENTION  06/18/2018   Procedure: PERIPHERAL VASCULAR INTERVENTION;  Surgeon: Young Hensen, MD;  [facility]: [facility];  Service: Cardiovascular;;  Left external iliac   ROTATOR CUFF REPAIR Left    TEE WITHOUT CARDIOVERSION N/A 10/25/2021   Procedure: TRANSESOPHAGEAL ECHOCARDIOGRAM (TEE);  Surgeon: Hilarie Lovely, MD;  Location: [facility] OR;  Service: Open Heart Surgery;  Laterality: N/A;   UMBILICAL HERNIA REPAIR     Patient Active Problem List   Diagnosis Date Noted   Essential tremor 12/26/2022   Paresthesia 12/26/2022   Hypokalemia 03/01/2022   Hypoalbuminemia 03/01/2022   Abnormal LFTs 03/01/2022   Symptomatic anemia 02/27/2022   S/P CABG x 3 02/27/2022    Iron  deficiency anemia 02/27/2022   Complication of surgical procedure 02/13/2022   Degeneration of lumbar intervertebral disc 02/13/2022   S/P AVR (aortic valve replacement) 10/25/2021   Coronary artery disease of native artery of native heart with stable angina pectoris (HCC)    Abnormal cardiac CT angiography    PAD (peripheral artery disease) (HCC) 06/05/2021   S/P lumbar fusion 12/07/2020   Peripheral vascular disease (HCC) 04/06/2020   Cerebrovascular accident (CVA) (HCC) 04/06/2020   S/P carotid endarterectomy 04/06/2020   DM type 2 with diabetic peripheral neuropathy (HCC) 12/31/2019   Gait abnormality 12/31/2019   Tremor 12/07/2019   Carotid stenosis, asymptomatic, right 08/24/2019   Carotid disease, bilateral (HCC) 08/11/2019   S/P lumbar laminectomy 08/01/2018   PVD (peripheral vascular disease) (HCC) 06/10/2018   Left lumbar radiculopathy 04/01/2018   Peripheral neuropathy 04/01/2018   Left leg claudication (HCC) 04/01/2018   Status post arthroscopy of right shoulder 06/27/2017   Superior glenoid labrum lesion of right shoulder 06/10/2017  Chronic right shoulder pain 04/16/2017   Displaced fracture of proximal phalanx of right great toe with routine healing 03/13/2017   Impingement syndrome of right shoulder 03/13/2017   Displaced fracture of proximal phalanx of right great toe, initial encounter for closed fracture 02/20/2017   Diabetes (HCC) 08/24/2013   History of Holter monitoring    Heart murmur    TIA (transient ischemic attack) 12/26/2012   Kidney stones    DM 11/30/2009   HYPERCHOLESTEROLEMIA 11/30/2009   Essential hypertension 11/30/2009   URINARY CALCULUS 11/30/2009   CAROTID BRUIT, RIGHT 11/30/2009    ONSET DATE: CVA 2021:  Referral date 10-03-23  REFERRING DIAG: I63.9 (ICD-10-CM) - Cerebrovascular accident (CVA), unspecified mechanism (HCC) I73.9 (ICD-10-CM) - PAD (peripheral artery disease) (HCC) R26.9 (ICD-10-CM) - Gait abnormality  THERAPY DIAG:   Other abnormalities of gait and mobility  Muscle weakness (generalized)  Unsteadiness on feet  Rationale for Evaluation and Treatment: Rehabilitation  SUBJECTIVE:                                                                                                                                                                                             SUBJECTIVE STATEMENT: Pt reports he has not had any dizziness since Epley was performed.  Pt has not called PCP yet to schedule face to face visit - informed pt that message was sent to Dr. Ivey Marlin requesting order for Lt AFO - telephone message in chart Pt accompanied by: self  PERTINENT HISTORY: 1.  Vertigo 2.  Worsening gait abnormality 3.  Tremor 4.  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  History of stroke 6.  Right carotid artery endarterectomy March 2021 7.  Significant peripheral vascular disease (He has extensive history including left external iliac stenting for claudication on 06/18/2018. Later he had left SFA stenting for claudication on 12/02/2019. Ultimately he then had left common femoral endarterectomy with profundoplasty and bovine patch as well as left common femoral to below-knee popliteal bypass with 6 mm PTFE on 06/05/2021)  PAIN:  Are you having pain? Yes: NPRS scale: 6/10 with ambulation; at rest 1-2/10 Pain location: bil. Feet and Lt leg Pain description: pins & needles in feet; achy  Aggravating factors: prolonged ambulation makes pain in Lt leg worse Relieving factors: sitting  PRECAUTIONS: Fall  RED FLAGS: None   WEIGHT BEARING RESTRICTIONS: No  FALLS: Has patient fallen in last 6 months? Yes. Number of falls 1 - fell backward and hit kitchen countertops  LIVING ENVIRONMENT: Lives with: lives with their spouse Lives in: House/apartment Stairs: Yes: External: 2 steps; none Has following equipment at home: Single  point cane  PLOF: Independent with basic ADLs, Independent with household  mobility without device, and Independent with community mobility with device  PATIENT GOALS: Improve balance & LLE strength  OBJECTIVE:  Note: Objective measures were completed at Evaluation unless otherwise noted.  DIAGNOSTIC FINDINGS: IMPRESSION: This MR angiogram of the neck arteries with and without contrast shows the following: There is 75 to 80% stenosis of the left internal carotid artery at the junction of the lacerum and cavernous segments.  This could be hemodynamically significant.  More proximally, there is mild to moderate stenosis (50%) at the origin of the left common carotid artery and mild stenosis near the origin of the left internal carotid artery that would not be hemodynamically significant. There is mild stenosis at the origin of the right common carotid artery and moderate stenosis more distally in the common carotid artery before the bifurcation.  No significant stenosis is noted in the right internal carotid artery. There is mild stenosis at the origin of the right vertebral artery that would not be hemodynamically significant. There is mild stenosis at the origin of the left vertebral artery and patchy mild to moderate the gnosis in the cervical, V3 and V4 segment of the left vertebral artery.  This is probably not hemodynamically significant.  IMPRESSION: This MR angiogram of the intracranial arteries shows the following: 80% stenosis of the left internal carotid artery at the junction of the lacerum and cavernous segments.  This could be hemodynamically significant. Minimal stenosis within the V4 segment of the left vertebral artery.  This is unlikely to be hemodynamically significant.   COGNITION: Overall cognitive status: Within functional limits for tasks assessed   SENSATION: Numbness in Lt foot; pins and needles in Rt foot - sometimes burning sensation  COORDINATION: NT  EDEMA:  Pt wearing compression stockings bil. LE's - > edema in LLE than in  RLE  POSTURE: No Significant postural limitations  LOWER EXTREMITY ROM:   WFL's bil. LE's  LOWER EXTREMITY MMT:    MMT Right Eval Left Eval  Hip flexion 4 3+  Hip extension 3 3-  Hip abduction    Hip adduction    Hip internal rotation    Hip external rotation    Knee flexion 4 3+  Knee extension 5 4  Ankle dorsiflexion 5 4-  Ankle plantarflexion    Ankle inversion    Ankle eversion    (Blank rows = not tested)  BED MOBILITY:  Findings: Indepedent  TRANSFERS: Sit to stand: Modified independence  Assistive device utilized: bil. UE support      GAIT: Findings: Gait Characteristics: step through pattern and decreased ankle dorsiflexion- Left, Distance walked: 50', Assistive device utilized:None, Level of assistance: Modified independence, and Comments: decreased push off bil. LE's due to bil. Plantarflexor weakness  FUNCTIONAL TESTS:  5 times sit to stand: 21.16 secs with bil. UE support from chair Timed up and go (TUG): 12.63 secs without device 10 meter walk test: 11.62 secs = 2.82 ft/sec without device ;   Lt Dix-Hallpike - no nystagmus noted but pt reported dizziness in test position  TREATMENT DATE: 10-29-23   NeuroRe-ed: LLE SLS activities inside // bars:  touching 3 balance bubbles 5 reps each leg with UE support prn; progressed to touching 2 cones 5 reps each leg with SBA - use of // bar prn for assist with balance recovery  Rockerboard 10 reps x 3 sets with progressive decrease in UE support Rocking forward - stepping down to floor 5 reps each foot forward, then 5 reps each foot backward with UE support prn  Standing on [employer]irex inside // bars - EO - 10 sec hold with SBA; horizontal head turns 5 reps with CGA with UE support prn; vertical head turns 5 reps with CGA 5 reps  Crossovers 10' x 2 reps inside // bars; stepping behind for improved  balance with narrow BOS and for improved coordination 10' x 2 reps       Gait:  Pt gait trained with Lt Ott[place]ock Walk on Trimable on LLE;  115' x 2 reps without device with SBA;    10/29/23 0001  Dynamic Gait Index  Level Surface 3  Change in Gait Speed 2  Gait with Horizontal Head Turns 2  Gait with Vertical Head Turns 1  Gait and Pivot Turn 3  Step Over Obstacle 2  Step Around Obstacles 3  Steps 2  Total Score 18/24  DGI comment: Ottobock AFO on LLE used during test     TherEx:  Lt step up exercise onto 4" step with bil. UE support on hand rails 10 reps   Leg press - bil. LE's - 70# 15 reps:             LLE only - 45# 10 reps x 2 sets     PATIENT EDUCATION: Education details: reviewed theraband exercises issued during previous OP admission - pt reports no problems with this HEP and states he does them rather regularly Person educated: Patient Education method: Explanation, Demonstration, and Handouts Education comprehension: verbalized understanding and returned demonstration  HOME EXERCISE PROGRAM: To be established  GOALS: Goals reviewed with patient? Yes  SHORT TERM GOALS: Target date: 11-15-23  Perform DGA and establish LTG. Baseline: Goal status: INITIAL  2.  Improve 5x sit to stand to </= 18 secs with bil. UE support from chair. Baseline: 21.16 secs Goal status: INITIAL  3.  Increase gait velocity to >/= 3.0 ft/sec without device for increased gait efficiency. Baseline: 11.62 secs = 2.82 ft/sec without device  Goal status: INITIAL  4.  Trial of AFO for LLE for increased safety with ambulation. Baseline:  Goal status: INITIAL  5.  Independent in HEP for LLE strengthening and balance exercises. Baseline:  Goal status: INITIAL    LONG TERM GOALS: Target date: 12-13-23  Increase DGI score by at least 3 points for increased safety with gait and for reduced fall risk. Baseline:  Goal status: INITIAL  2.  Improve 5x sit to stand to </= 16  secs with bil. UE support from chair. Baseline: 21.16 secs Goal status: INITIAL  3.  Increase gait velocity to >/= 3.2 ft/sec without device for increased gait efficiency. Baseline: 11.62 secs = 2.82 ft/sec without device  Goal status: INITIAL  4.  Pt will have (-) Lt Dix-Hallpike test with no c/o vertigo to indicate resolution of Lt BPPV. Baseline:  Goal status: INITIAL  5.  Obtain AFO for LLE if determined to be beneficial in improving gait. Baseline:  Goal status: INITIAL  6.  Independent in updated HEP as appropriate for balance and LLE  strengthening. Baseline:  Goal status: INITIAL  ASSESSMENT:  CLINICAL IMPRESSION: PT session focused on dynamic gait assessment with pt scoring 18/24 on DGI, indicative of fall risk as score < 19/24.  Pt wore AFO on LLE which is beneficial in increasing safety with ambulation by effectively providing Lt foot clearance in swing phase of gait.  Message sent to pt's PCP requesting order for Lt AFO (no response received as of current time).  Pt has decreased SLS on LLE but is improving.   Cont with POC.   OBJECTIVE IMPAIRMENTS: decreased activity tolerance, decreased balance, difficulty walking, decreased strength, dizziness, increased edema, impaired sensation, and pain.   ACTIVITY LIMITATIONS: bending, standing, squatting, stairs, transfers, and locomotion level  PARTICIPATION LIMITATIONS: meal prep, cleaning, laundry, shopping, community activity, and yard work  PERSONAL FACTORS: Past/current experiences, Time since onset of injury/illness/exacerbation, and 1-2 comorbidities: h/o CVA, neuropathy and PVD in LLE are also affecting patient's functional outcome.   REHAB POTENTIAL: Good  CLINICAL DECISION MAKING: Evolving/moderate complexity  EVALUATION COMPLEXITY: Moderate  PLAN:  PT FREQUENCY: 1x/week  PT DURATION: 8 weeks + eval = 9 sessions  PLANNED INTERVENTIONS: 97110-Therapeutic exercises, 97530- Therapeutic activity, [id number]-  Neuromuscular re-education, [phone]- Self Care, 19147- Gait training, and [phone]- Canalith repositioning  PLAN FOR NEXT SESSION:  issue balance HEP   Joey Lierman, Celeste Cola, PT 11/01/2023, 3:49 PM

## 2023-10-31 DIAGNOSIS — M21372 Foot drop, left foot: Secondary | ICD-10-CM | POA: Diagnosis not present

## 2023-11-01 ENCOUNTER — Encounter: Payer: Self-pay | Admitting: Physical Therapy

## 2023-11-01 NOTE — Progress Notes (Signed)
   10/29/23 0001  Dynamic Gait Index  Level Surface 3  Change in Gait Speed 2  Gait with Horizontal Head Turns 2  Gait with Vertical Head Turns 1  Gait and Pivot Turn 3  Step Over Obstacle 2  Step Around Obstacles 3  Steps 2  Total Score 18  DGI comment: Ottobock AFO on LLE used during test

## 2023-11-07 ENCOUNTER — Ambulatory Visit: Admitting: Physical Therapy

## 2023-11-07 DIAGNOSIS — M6281 Muscle weakness (generalized): Secondary | ICD-10-CM

## 2023-11-07 DIAGNOSIS — R2681 Unsteadiness on feet: Secondary | ICD-10-CM

## 2023-11-07 DIAGNOSIS — R2689 Other abnormalities of gait and mobility: Secondary | ICD-10-CM | POA: Diagnosis not present

## 2023-11-07 NOTE — Therapy (Signed)
 OUTPATIENT PHYSICAL THERAPY NEURO TREATMENT NOTE   Patient Name: Luis Lindsey MRN: [id number] DOB:1950-11-19, 73 y.o., male Today's Date: 11/08/2023   PCP: Benedetto Brady, MD REFERRING PROVIDER: Wess Hammed, NP  END OF SESSION:  PT End of Session - 11/08/23 1615     Visit Number 4    Number of Visits 9   only 6 authorized   Date for PT Re-Evaluation 12/13/23    Authorization Type [facility] Medicare    Authorization Time Period 10-17-23 - 12-12-23    Authorization - Visit Number 4    Authorization - Number of Visits 6    PT Start Time 0848    PT Stop Time 0932    PT Time Calculation (min) 44 min    Equipment Utilized During Treatment Gait belt;Other (comment)   Ottobock walk on AFO for LLE   Activity Tolerance Patient tolerated treatment well    Behavior During Therapy WFL for tasks assessed/performed             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   Dysphagia    Esophageal dysmotility    GERD (gastroesophageal reflux disease)    Heart murmur    pt had recent echocardiogram   History of hiatal hernia    History of Holter monitoring 2009   History of kidney stones    HNP (herniated nucleus pulposus), lumbar    L4-5   HYPERCHOLESTEROLEMIA 11/30/2009   Hyperlipidemia    HYPERTENSION 11/30/2009   Kidney stones    Numbness and tingling    Peripheral vascular disease (HCC)    Right carotid bruit    SMOKER 11/30/2009   Stroke (HCC) 2012   URINARY CALCULUS 11/30/2009   Past Surgical History:  Procedure Laterality Date   ABDOMINAL AORTOGRAM W/LOWER EXTREMITY N/A 06/18/2018   Procedure: ABDOMINAL AORTOGRAM W/LOWER EXTREMITY;  Surgeon: Young Hensen, MD;  Location: [facility];  Service: Cardiovascular;  Laterality: N/A;   ABDOMINAL AORTOGRAM W/LOWER EXTREMITY N/A  12/02/2019   Procedure: ABDOMINAL AORTOGRAM W/LOWER EXTREMITY;  Surgeon: Young Hensen, MD;  Location: [facility];  Service: Cardiovascular;  Laterality: N/A;   ABDOMINAL AORTOGRAM W/LOWER EXTREMITY N/A 03/23/2021   Procedure: ABDOMINAL AORTOGRAM W/LOWER EXTREMITY;  Surgeon: Young Hensen, MD;  Location: [facility];  Service: Cardiovascular;  Laterality: N/A;   AORTIC VALVE REPLACEMENT N/A 10/25/2021   Procedure: AORTIC VALVE REPLACEMENT (AVR)USING INSPIRIS RESILIA  AORTIC VALVE;  Surgeon: Hilarie Lovely, MD;  Location: [facility];  Service: Open Heart Surgery;  Laterality: N/A;   arthroscopy of right shoulder Right    Jan 2019, June 2019-- done at [facility], Dr. Lucienne Ryder   BACK SURGERY     CATARACT EXTRACTION W/ INTRAOCULAR LENS  IMPLANT, BILATERAL     CORONARY ARTERY BYPASS GRAFT N/A 10/25/2021   Procedure: CORONARY ARTERY BYPASS GRAFTING (CABG) X 3 USING LEFT INTERNAL MAMMARY ARTERY AND RIGHT GREATER SAPHENOUS VEIN;  Surgeon: Hilarie Lovely, MD;  Location: [facility];  Service: Open Heart Surgery;  Laterality: N/A;   CYSTOSCOPY     ENDARTERECTOMY Right 08/24/2019   Procedure: ENDARTERECTOMY CAROTID;  Surgeon: Young Hensen, MD;  Location: [facility] OR;  Service: Vascular;  Laterality: Right;  ENDARTERECTOMY FEMORAL Left 06/05/2021   Procedure: ENDARTERECTOMY FEMORAL WITH PROFUNDAPLASTY;  Surgeon: Young Hensen, MD;  Location: [facility] OR;  Service: Vascular;  Laterality: Left;   ENDOVEIN HARVEST OF GREATER SAPHENOUS VEIN Right 10/25/2021   Procedure: ENDOVEIN HARVEST OF GREATER SAPHENOUS VEIN;  Surgeon: Hilarie Lovely, MD;  Location: [facility];  Service: Open Heart Surgery;  Laterality: Right;   FACIAL COSMETIC SURGERY  2014   FEMORAL-POPLITEAL BYPASS GRAFT Left 06/05/2021   Procedure: LEFT FEMORAL-POPLITEAL ARTERY BYPASS GRAFTING USING 6mm PROPATEN VASCULAR REMOVABLE RING GRAFT;  Surgeon: Young Hensen, MD;  Location: [facility];  Service:  Vascular;  Laterality: Left;   FINGER SURGERY  2002   INGUINAL HERNIA REPAIR Left 02/01/2023   Procedure: LAPAROSCOPIC LEFT INGUINAL HERNIA REPAIR WITH MESH;  Surgeon: Junie Olds, MD;  Location: [facility];  Service: General;  Laterality: Left;  90   LEFT HEART CATH AND CORONARY ANGIOGRAPHY N/A 10/05/2021   Procedure: LEFT HEART CATH AND CORONARY ANGIOGRAPHY;  Surgeon: Sammy Crisp, MD;  [facility]: [facility];  Service: Cardiovascular;  Laterality: N/A;   LUMBAR LAMINECTOMY/DECOMPRESSION MICRODISCECTOMY Left 08/01/2018   Procedure: Microdiscectomy - Lumbar four-Lumbar five - left;  Surgeon: Isadora Mar, MD;  Location: [facility] OR;  Service: Neurosurgery;  Laterality: Left;   LUMBAR LAMINECTOMY/DECOMPRESSION MICRODISCECTOMY Left 10/15/2018   Procedure: Re-do Microdiscectomy - left - Lumbar four-Lumbar five;  Surgeon: Isadora Mar, MD;  Location: [facility] OR;  Service: Neurosurgery;  Laterality: Left;   NECK SURGERY  1986   PATCH ANGIOPLASTY Left 06/05/2021   Procedure: PATCH ANGIOPLASTY USING Corinna Dickens BIOLOGIC PATCH;  Surgeon: Young Hensen, MD;  Location: [facility] OR;  Service: Vascular;  Laterality: Left;   PERIPHERAL VASCULAR ATHERECTOMY  12/02/2019   Procedure: PERIPHERAL VASCULAR ATHERECTOMY;  Surgeon: Young Hensen, MD;  [facility];  Service: Cardiovascular;;  left SFA   PERIPHERAL VASCULAR INTERVENTION  06/18/2018   Procedure: PERIPHERAL VASCULAR INTERVENTION;  Surgeon: Young Hensen, MD;  [facility]: [facility];  Service: Cardiovascular;;  Left external iliac   ROTATOR CUFF REPAIR Left    TEE WITHOUT CARDIOVERSION N/A 10/25/2021   Procedure: TRANSESOPHAGEAL ECHOCARDIOGRAM (TEE);  Surgeon: Hilarie Lovely, MD;  Location: [facility] OR;  Service: Open Heart Surgery;  Laterality: N/A;   UMBILICAL HERNIA REPAIR     Patient Active Problem List   Diagnosis Date Noted   Essential tremor 12/26/2022   Paresthesia 12/26/2022   Hypokalemia 03/01/2022    Hypoalbuminemia 03/01/2022   Abnormal LFTs 03/01/2022   Symptomatic anemia 02/27/2022   S/P CABG x 3 02/27/2022   Iron  deficiency anemia 02/27/2022   Complication of surgical procedure 02/13/2022   Degeneration of lumbar intervertebral disc 02/13/2022   S/P AVR (aortic valve replacement) 10/25/2021   Coronary artery disease of native artery of native heart with stable angina pectoris (HCC)    Abnormal cardiac CT angiography    PAD (peripheral artery disease) (HCC) 06/05/2021   S/P lumbar fusion 12/07/2020   Peripheral vascular disease (HCC) 04/06/2020   Cerebrovascular accident (CVA) (HCC) 04/06/2020   S/P carotid endarterectomy 04/06/2020   DM type 2 with diabetic peripheral neuropathy (HCC) 12/31/2019   Gait abnormality 12/31/2019   Tremor 12/07/2019   Carotid stenosis, asymptomatic, right 08/24/2019   Carotid disease, bilateral (HCC) 08/11/2019   S/P lumbar laminectomy 08/01/2018   PVD (peripheral vascular disease) (HCC) 06/10/2018   Left lumbar radiculopathy 04/01/2018   Peripheral neuropathy 04/01/2018   Left leg claudication (HCC) 04/01/2018  Status post arthroscopy of right shoulder 06/27/2017   Superior glenoid labrum lesion of right shoulder 06/10/2017   Chronic right shoulder pain 04/16/2017   Displaced fracture of proximal phalanx of right great toe with routine healing 03/13/2017   Impingement syndrome of right shoulder 03/13/2017   Displaced fracture of proximal phalanx of right great toe, initial encounter for closed fracture 02/20/2017   Diabetes (HCC) 08/24/2013   History of Holter monitoring    Heart murmur    TIA (transient ischemic attack) 12/26/2012   Kidney stones    DM 11/30/2009   HYPERCHOLESTEROLEMIA 11/30/2009   Essential hypertension 11/30/2009   URINARY CALCULUS 11/30/2009   CAROTID BRUIT, RIGHT 11/30/2009    ONSET DATE: CVA 2021:  Referral date 10-03-23  REFERRING DIAG: I63.9 (ICD-10-CM) - Cerebrovascular accident (CVA), unspecified mechanism  (HCC) I73.9 (ICD-10-CM) - PAD (peripheral artery disease) (HCC) R26.9 (ICD-10-CM) - Gait abnormality  THERAPY DIAG:  Other abnormalities of gait and mobility  Muscle weakness (generalized)  Unsteadiness on feet  Rationale for Evaluation and Treatment: Rehabilitation  SUBJECTIVE:                                                                                                                                                                                             SUBJECTIVE STATEMENT: Pt reports he feels balance has not been as good in past few days - says he and wife went to [employer] and he had to sit down and rest due to legs getting tired; pt was informed that PT feels that this is due more to his PVD than to actual balance deficit.  Pt had face to face visit with PCP to get the AFO.  Script for AFO and face to face visit note to be faxed to [facility] for appt for AFO Pt accompanied by: self  PERTINENT HISTORY: 1.  Vertigo 2.  Worsening gait abnormality 3.  Tremor 4.  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  History of stroke 6.  Right carotid artery endarterectomy March 2021 7.  Significant peripheral vascular disease (He has extensive history including left external iliac stenting for claudication on 06/18/2018. Later he had left SFA stenting for claudication on 12/02/2019. Ultimately he then had left common femoral endarterectomy with profundoplasty and bovine patch as well as left common femoral to below-knee popliteal bypass with 6 mm PTFE on 06/05/2021)  PAIN:  Are you having pain? Yes: NPRS scale: 6/10 with ambulation; at rest 1-2/10 Pain location: bil. Feet and Lt leg Pain description: pins & needles in feet; achy  Aggravating factors: prolonged ambulation makes pain in Lt leg worse  Relieving factors: sitting  PRECAUTIONS: Fall  RED FLAGS: None   WEIGHT BEARING RESTRICTIONS: No  FALLS: Has patient fallen in last 6 months? Yes. Number of falls 1 - fell  backward and hit kitchen countertops  LIVING ENVIRONMENT: Lives with: lives with their spouse Lives in: House/apartment Stairs: Yes: External: 2 steps; none Has following equipment at home: Single point cane  PLOF: Independent with basic ADLs, Independent with household mobility without device, and Independent with community mobility with device  PATIENT GOALS: Improve balance & LLE strength  OBJECTIVE:  Note: Objective measures were completed at Evaluation unless otherwise noted.  DIAGNOSTIC FINDINGS: IMPRESSION: This MR angiogram of the neck arteries with and without contrast shows the following: There is 75 to 80% stenosis of the left internal carotid artery at the junction of the lacerum and cavernous segments.  This could be hemodynamically significant.  More proximally, there is mild to moderate stenosis (50%) at the origin of the left common carotid artery and mild stenosis near the origin of the left internal carotid artery that would not be hemodynamically significant. There is mild stenosis at the origin of the right common carotid artery and moderate stenosis more distally in the common carotid artery before the bifurcation.  No significant stenosis is noted in the right internal carotid artery. There is mild stenosis at the origin of the right vertebral artery that would not be hemodynamically significant. There is mild stenosis at the origin of the left vertebral artery and patchy mild to moderate the gnosis in the cervical, V3 and V4 segment of the left vertebral artery.  This is probably not hemodynamically significant.  IMPRESSION: This MR angiogram of the intracranial arteries shows the following: 80% stenosis of the left internal carotid artery at the junction of the lacerum and cavernous segments.  This could be hemodynamically significant. Minimal stenosis within the V4 segment of the left vertebral artery.  This is unlikely to be hemodynamically  significant.   COGNITION: Overall cognitive status: Within functional limits for tasks assessed   SENSATION: Numbness in Lt foot; pins and needles in Rt foot - sometimes burning sensation  COORDINATION: NT  EDEMA:  Pt wearing compression stockings bil. LE's - > edema in LLE than in RLE  POSTURE: No Significant postural limitations  LOWER EXTREMITY ROM:   WFL's bil. LE's  LOWER EXTREMITY MMT:    MMT Right Eval Left Eval  Hip flexion 4 3+  Hip extension 3 3-  Hip abduction    Hip adduction    Hip internal rotation    Hip external rotation    Knee flexion 4 3+  Knee extension 5 4  Ankle dorsiflexion 5 4-  Ankle plantarflexion    Ankle inversion    Ankle eversion    (Blank rows = not tested)  BED MOBILITY:  Findings: Indepedent  TRANSFERS: Sit to stand: Modified independence  Assistive device utilized: bil. UE support      GAIT: Findings: Gait Characteristics: step through pattern and decreased ankle dorsiflexion- Left, Distance walked: 50', Assistive device utilized:None, Level of assistance: Modified independence, and Comments: decreased push off bil. LE's due to bil. Plantarflexor weakness  FUNCTIONAL TESTS:  5 times sit to stand: 21.16 secs with bil. UE support from chair Timed up and go (TUG): 12.63 secs without device 10 meter walk test: 11.62 secs = 2.82 ft/sec without device ;   Lt Dix-Hallpike - no nystagmus noted but pt reported dizziness in test position  TREATMENT DATE: 11-07-23  Gait:  Pt gait trained with Lt Ottobock Walk on Trimable on LLE;  115' x 2 reps without device with SBA;   TherAct: Sit to stand transfers - feet on floor - 5 reps with 1 UE (LUE support); 5 reps with feet on Airex with LUE support only  LLE SLS activities inside // bars:  touching 3 balance bubbles 5 reps each leg with UE support prn; progressed to  touching 2 cones 5 reps each leg with SBA - use of // bar prn for assist with balance recovery  Rockerboard 10 reps x 3 sets with progressive decrease in UE support Rocking forward - stepping down to floor 5 reps each foot forward, then 5 reps each foot backward with UE support prn Crossovers 10' x 2 reps inside // bars; stepping behind for improved balance with narrow BOS and for improved coordination 10' x 2 reps  Used red theraband for resistive ambulation - sideways - 25' x 2 reps; 25' x 1 rep forward, 25' x 1 rep backward - with bil. UE support on PT's forearms  TherEx:  Lt step up exercise onto 4 step with bil. UE support on // bars 10 reps   Leg press - bil. LE's - 70# 20 reps:             LLE only - 45# 10 reps;  40# 10 reps      PATIENT EDUCATION: Education details: reviewed theraband exercises issued during previous OP admission - pt reports no problems with this HEP and states he does them rather regularly Person educated: Patient Education method: Explanation, Demonstration, and Handouts Education comprehension: verbalized understanding and returned demonstration  HOME EXERCISE PROGRAM: To be established  GOALS: Goals reviewed with patient? Yes  SHORT TERM GOALS: Target date: 11-15-23  Perform DGA and establish LTG. Baseline: Goal status: INITIAL  2.  Improve 5x sit to stand to </= 18 secs with bil. UE support from chair. Baseline: 21.16 secs Goal status: INITIAL  3.  Increase gait velocity to >/= 3.0 ft/sec without device for increased gait efficiency. Baseline: 11.62 secs = 2.82 ft/sec without device  Goal status: INITIAL  4.  Trial of AFO for LLE for increased safety with ambulation. Baseline:  Goal status: INITIAL  5.  Independent in HEP for LLE strengthening and balance exercises. Baseline:  Goal status: INITIAL    LONG TERM GOALS: Target date: 12-13-23  Increase DGI score by at least 3 points for increased safety with gait and for reduced fall  risk. Baseline:  Goal status: INITIAL  2.  Improve 5x sit to stand to </= 16 secs with bil. UE support from chair. Baseline: 21.16 secs Goal status: INITIAL  3.  Increase gait velocity to >/= 3.2 ft/sec without device for increased gait efficiency. Baseline: 11.62 secs = 2.82 ft/sec without device  Goal status: INITIAL  4.  Pt will have (-) Lt Dix-Hallpike test with no c/o vertigo to indicate resolution of Lt BPPV. Baseline:  Goal status: INITIAL  5.  Obtain AFO for LLE if determined to be beneficial in improving gait. Baseline:  Goal status: INITIAL  6.  Independent in updated HEP as appropriate for balance and LLE strengthening. Baseline:  Goal status: INITIAL  ASSESSMENT:  CLINICAL IMPRESSION: PT session focused on lower extremity strengthening with primary focus on LLE.  Pt able to increase weight on leg press from 40# to 45# for 1 set of 10 reps for LLE unilateral leg press.  Pt's  gait is significantly improved and safer with increased Lt foot clearance in swing phase of gait achieved with this orthosis (Ottobock walk on AFO).  Cont with POC.   OBJECTIVE IMPAIRMENTS: decreased activity tolerance, decreased balance, difficulty walking, decreased strength, dizziness, increased edema, impaired sensation, and pain.   ACTIVITY LIMITATIONS: bending, standing, squatting, stairs, transfers, and locomotion level  PARTICIPATION LIMITATIONS: meal prep, cleaning, laundry, shopping, community activity, and yard work  PERSONAL FACTORS: Past/current experiences, Time since onset of injury/illness/exacerbation, and 1-2 comorbidities: h/o CVA, neuropathy and PVD in LLE are also affecting patient's functional outcome.   REHAB POTENTIAL: Good  CLINICAL DECISION MAKING: Evolving/moderate complexity  EVALUATION COMPLEXITY: Moderate  PLAN:  PT FREQUENCY: 1x/week  PT DURATION: 8 weeks + eval = 9 sessions  PLANNED INTERVENTIONS: 97110-Therapeutic exercises, 97530- Therapeutic activity,  [id number]- Neuromuscular re-education, 97535- Self Care, [id number]- Gait training, and [phone]- Canalith repositioning  PLAN FOR NEXT SESSION:  Heard from Trilla? Pw faxed on 11-07-23 - script & office visit note Cont. LE strengthening and balance training   Verlena Marlette, Celeste Cola, PT 11/08/2023, 4:20 PM

## 2023-11-08 ENCOUNTER — Encounter: Payer: Self-pay | Admitting: Physical Therapy

## 2023-11-19 ENCOUNTER — Ambulatory Visit: Admitting: Physical Therapy

## 2023-11-19 DIAGNOSIS — M6281 Muscle weakness (generalized): Secondary | ICD-10-CM

## 2023-11-19 DIAGNOSIS — R2689 Other abnormalities of gait and mobility: Secondary | ICD-10-CM

## 2023-11-19 DIAGNOSIS — R2681 Unsteadiness on feet: Secondary | ICD-10-CM

## 2023-11-19 NOTE — Therapy (Unsigned)
 OUTPATIENT PHYSICAL THERAPY NEURO TREATMENT NOTE   Patient Name: Luis Lindsey MRN: [id number] DOB:1951/02/20, 73 y.o., male Today's Date: 11/21/2023   PCP: Leonel Cole, MD REFERRING PROVIDER: Gayland Lauraine JINNY, NP  END OF SESSION:  PT End of Session - 11/21/23 1854     Visit Number 5    Number of Visits 9   only 6 authorized   Date for PT Re-Evaluation 12/13/23    Authorization Type [employer]    Authorization Time Period 10-17-23 - 12-12-23    Authorization - Visit Number 5    Authorization - Number of Visits 6    PT Start Time 1447    PT Stop Time 1532    PT Time Calculation (min) 45 min    Equipment Utilized During Treatment Gait belt;Other (comment)   Ottobock walk on AFO for LLE   Activity Tolerance Patient tolerated treatment well    Behavior During Therapy WFL for tasks assessed/performed              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   Dysphagia    Esophageal dysmotility    GERD (gastroesophageal reflux disease)    Heart murmur    pt had recent echocardiogram   History of hiatal hernia    History of Holter monitoring 2009   History of kidney stones    HNP (herniated nucleus pulposus), lumbar    L4-5   HYPERCHOLESTEROLEMIA 11/30/2009   Hyperlipidemia    HYPERTENSION 11/30/2009   Kidney stones    Numbness and tingling    Peripheral vascular disease (HCC)    Right carotid bruit    SMOKER 11/30/2009   Stroke (HCC) 2012   URINARY CALCULUS 11/30/2009   Past Surgical History:  Procedure Laterality Date   ABDOMINAL AORTOGRAM W/LOWER EXTREMITY N/A 06/18/2018   Procedure: ABDOMINAL AORTOGRAM W/LOWER EXTREMITY;  Surgeon: Gretta Lonni JINNY, MD;  Location: [facility];  Service: Cardiovascular;  Laterality: N/A;   ABDOMINAL AORTOGRAM W/LOWER EXTREMITY N/A  12/02/2019   Procedure: ABDOMINAL AORTOGRAM W/LOWER EXTREMITY;  Surgeon: Gretta Lonni JINNY, MD;  Location: [facility];  Service: Cardiovascular;  Laterality: N/A;   ABDOMINAL AORTOGRAM W/LOWER EXTREMITY N/A 03/23/2021   Procedure: ABDOMINAL AORTOGRAM W/LOWER EXTREMITY;  Surgeon: Gretta Lonni JINNY, MD;  Location: [facility];  Service: Cardiovascular;  Laterality: N/A;   AORTIC VALVE REPLACEMENT N/A 10/25/2021   Procedure: AORTIC VALVE REPLACEMENT (AVR)USING INSPIRIS RESILIA  AORTIC VALVE;  Surgeon: Shyrl Linnie KIDD, MD;  Location: [facility];  Service: Open Heart Surgery;  Laterality: N/A;   arthroscopy of right shoulder Right    Jan 2019, June 2019-- done at [facility], Dr. Vernetta   BACK SURGERY     CATARACT EXTRACTION W/ INTRAOCULAR LENS  IMPLANT, BILATERAL     CORONARY ARTERY BYPASS GRAFT N/A 10/25/2021   Procedure: CORONARY ARTERY BYPASS GRAFTING (CABG) X 3 USING LEFT INTERNAL MAMMARY ARTERY AND RIGHT GREATER SAPHENOUS VEIN;  Surgeon: Shyrl Linnie KIDD, MD;  Location: [facility];  Service: Open Heart Surgery;  Laterality: N/A;   CYSTOSCOPY     ENDARTERECTOMY Right 08/24/2019   Procedure: ENDARTERECTOMY CAROTID;  Surgeon: Gretta Lonni JINNY, MD;  Location: [facility] OR;  Service: Vascular;  Laterality:  Right;   ENDARTERECTOMY FEMORAL Left 06/05/2021   Procedure: ENDARTERECTOMY FEMORAL WITH PROFUNDAPLASTY;  Surgeon: Gretta Lonni PARAS, MD;  Location: [facility] OR;  Service: Vascular;  Laterality: Left;   ENDOVEIN HARVEST OF GREATER SAPHENOUS VEIN Right 10/25/2021   Procedure: ENDOVEIN HARVEST OF GREATER SAPHENOUS VEIN;  Surgeon: Shyrl Linnie KIDD, MD;  Location: [facility];  Service: Open Heart Surgery;  Laterality: Right;   FACIAL COSMETIC SURGERY  2014   FEMORAL-POPLITEAL BYPASS GRAFT Left 06/05/2021   Procedure: LEFT FEMORAL-POPLITEAL ARTERY BYPASS GRAFTING USING 6mm PROPATEN VASCULAR REMOVABLE RING GRAFT;  Surgeon: Gretta Lonni PARAS, MD;  Location: [facility];  Service:  Vascular;  Laterality: Left;   FINGER SURGERY  2002   INGUINAL HERNIA REPAIR Left 02/01/2023   Procedure: LAPAROSCOPIC LEFT INGUINAL HERNIA REPAIR WITH MESH;  Surgeon: Lyndel Deward PARAS, MD;  Location: [facility];  Service: General;  Laterality: Left;  90   LEFT HEART CATH AND CORONARY ANGIOGRAPHY N/A 10/05/2021   Procedure: LEFT HEART CATH AND CORONARY ANGIOGRAPHY;  Surgeon: Mady Lonni, MD;  Location: [facility];  Service: Cardiovascular;  Laterality: N/A;   LUMBAR LAMINECTOMY/DECOMPRESSION MICRODISCECTOMY Left 08/01/2018   Procedure: Microdiscectomy - Lumbar four-Lumbar five - left;  Surgeon: Joshua Alm RAMAN, MD;  Location: [facility] OR;  Service: Neurosurgery;  Laterality: Left;   LUMBAR LAMINECTOMY/DECOMPRESSION MICRODISCECTOMY Left 10/15/2018   Procedure: Re-do Microdiscectomy - left - Lumbar four-Lumbar five;  Surgeon: Joshua Alm RAMAN, MD;  Location: [facility] OR;  Service: Neurosurgery;  Laterality: Left;   NECK SURGERY  1986   PATCH ANGIOPLASTY Left 06/05/2021   Procedure: PATCH ANGIOPLASTY USING GEORGE BIOLOGIC PATCH;  Surgeon: Gretta Lonni PARAS, MD;  Location: [facility] OR;  Service: Vascular;  Laterality: Left;   PERIPHERAL VASCULAR ATHERECTOMY  12/02/2019   Procedure: PERIPHERAL VASCULAR ATHERECTOMY;  Surgeon: Gretta Lonni PARAS, MD;  [facility]: [facility];  Service: Cardiovascular;;  left SFA   PERIPHERAL VASCULAR INTERVENTION  06/18/2018   Procedure: PERIPHERAL VASCULAR INTERVENTION;  Surgeon: Gretta Lonni PARAS, MD;  [facility]: [facility];  Service: Cardiovascular;;  Left external iliac   ROTATOR CUFF REPAIR Left    TEE WITHOUT CARDIOVERSION N/A 10/25/2021   Procedure: TRANSESOPHAGEAL ECHOCARDIOGRAM (TEE);  Surgeon: Shyrl Linnie KIDD, MD;  Location: [facility] OR;  Service: Open Heart Surgery;  Laterality: N/A;   UMBILICAL HERNIA REPAIR     Patient Active Problem List   Diagnosis Date Noted   Essential tremor 12/26/2022   Paresthesia 12/26/2022   Hypokalemia 03/01/2022    Hypoalbuminemia 03/01/2022   Abnormal LFTs 03/01/2022   Symptomatic anemia 02/27/2022   S/P CABG x 3 02/27/2022   Iron  deficiency anemia 02/27/2022   Complication of surgical procedure 02/13/2022   Degeneration of lumbar intervertebral disc 02/13/2022   S/P AVR (aortic valve replacement) 10/25/2021   Coronary artery disease of native artery of native heart with stable angina pectoris (HCC)    Abnormal cardiac CT angiography    PAD (peripheral artery disease) (HCC) 06/05/2021   S/P lumbar fusion 12/07/2020   Peripheral vascular disease (HCC) 04/06/2020   Cerebrovascular accident (CVA) (HCC) 04/06/2020   S/P carotid endarterectomy 04/06/2020   DM type 2 with diabetic peripheral neuropathy (HCC) 12/31/2019   Gait abnormality 12/31/2019   Tremor 12/07/2019   Carotid stenosis, asymptomatic, right 08/24/2019   Carotid disease, bilateral (HCC) 08/11/2019   S/P lumbar laminectomy 08/01/2018   PVD (peripheral vascular disease) (HCC) 06/10/2018   Left lumbar radiculopathy 04/01/2018   Peripheral neuropathy 04/01/2018   Left leg claudication (HCC)  04/01/2018   Status post arthroscopy of right shoulder 06/27/2017   Superior glenoid labrum lesion of right shoulder 06/10/2017   Chronic right shoulder pain 04/16/2017   Displaced fracture of proximal phalanx of right great toe with routine healing 03/13/2017   Impingement syndrome of right shoulder 03/13/2017   Displaced fracture of proximal phalanx of right great toe, initial encounter for closed fracture 02/20/2017   Diabetes (HCC) 08/24/2013   History of Holter monitoring    Heart murmur    TIA (transient ischemic attack) 12/26/2012   Kidney stones    DM 11/30/2009   HYPERCHOLESTEROLEMIA 11/30/2009   Essential hypertension 11/30/2009   URINARY CALCULUS 11/30/2009   CAROTID BRUIT, RIGHT 11/30/2009    ONSET DATE: CVA 2021:  Referral date 10-03-23  REFERRING DIAG: I63.9 (ICD-10-CM) - Cerebrovascular accident (CVA), unspecified mechanism  (HCC) I73.9 (ICD-10-CM) - PAD (peripheral artery disease) (HCC) R26.9 (ICD-10-CM) - Gait abnormality  THERAPY DIAG:  Other abnormalities of gait and mobility  Muscle weakness (generalized)  Unsteadiness on feet  Rationale for Evaluation and Treatment: Rehabilitation  SUBJECTIVE:                                                                                                                                                                                             SUBJECTIVE STATEMENT: Pt reports he has an appt at [facility] later this week for the AFO; feels that his left foot has been dragging a little more, noted especially when he has been walking in house without shoes on Pt accompanied by: self  PERTINENT HISTORY: 1.  Vertigo 2.  Worsening gait abnormality 3.  Tremor 4.  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  History of stroke 6.  Right carotid artery endarterectomy March 2021 7.  Significant peripheral vascular disease (He has extensive history including left external iliac stenting for claudication on 06/18/2018. Later he had left SFA stenting for claudication on 12/02/2019. Ultimately he then had left common femoral endarterectomy with profundoplasty and bovine patch as well as left common femoral to below-knee popliteal bypass with 6 mm PTFE on 06/05/2021)  PAIN:  Are you having pain? Yes: NPRS scale: 6/10 with ambulation; at rest 1-2/10 Pain location: bil. Feet and Lt leg Pain description: pins & needles in feet; achy  Aggravating factors: prolonged ambulation makes pain in Lt leg worse Relieving factors: sitting  PRECAUTIONS: Fall  RED FLAGS: None   WEIGHT BEARING RESTRICTIONS: No  FALLS: Has patient fallen in last 6 months? Yes. Number of falls 1 - fell backward and hit kitchen countertops  LIVING ENVIRONMENT: Lives with: lives with their spouse Lives  in: House/apartment Stairs: Yes: External: 2 steps; none Has following equipment at home: Single point  cane  PLOF: Independent with basic ADLs, Independent with household mobility without device, and Independent with community mobility with device  PATIENT GOALS: Improve balance & LLE strength  OBJECTIVE:  Note: Objective measures were completed at Evaluation unless otherwise noted.  DIAGNOSTIC FINDINGS: IMPRESSION: This MR angiogram of the neck arteries with and without contrast shows the following: There is 75 to 80% stenosis of the left internal carotid artery at the junction of the lacerum and cavernous segments.  This could be hemodynamically significant.  More proximally, there is mild to moderate stenosis (50%) at the origin of the left common carotid artery and mild stenosis near the origin of the left internal carotid artery that would not be hemodynamically significant. There is mild stenosis at the origin of the right common carotid artery and moderate stenosis more distally in the common carotid artery before the bifurcation.  No significant stenosis is noted in the right internal carotid artery. There is mild stenosis at the origin of the right vertebral artery that would not be hemodynamically significant. There is mild stenosis at the origin of the left vertebral artery and patchy mild to moderate the gnosis in the cervical, V3 and V4 segment of the left vertebral artery.  This is probably not hemodynamically significant.  IMPRESSION: This MR angiogram of the intracranial arteries shows the following: 80% stenosis of the left internal carotid artery at the junction of the lacerum and cavernous segments.  This could be hemodynamically significant. Minimal stenosis within the V4 segment of the left vertebral artery.  This is unlikely to be hemodynamically significant.   COGNITION: Overall cognitive status: Within functional limits for tasks assessed   SENSATION: Numbness in Lt foot; pins and needles in Rt foot - sometimes burning sensation  COORDINATION: NT  EDEMA:  Pt  wearing compression stockings bil. LE's - > edema in LLE than in RLE  POSTURE: No Significant postural limitations  LOWER EXTREMITY ROM:   WFL's bil. LE's  LOWER EXTREMITY MMT:    MMT Right Eval Left Eval  Hip flexion 4 3+  Hip extension 3 3-  Hip abduction    Hip adduction    Hip internal rotation    Hip external rotation    Knee flexion 4 3+  Knee extension 5 4  Ankle dorsiflexion 5 4-  Ankle plantarflexion    Ankle inversion    Ankle eversion    (Blank rows = not tested)  BED MOBILITY:  Findings: Indepedent  TRANSFERS: Sit to stand: Modified independence  Assistive device utilized: bil. UE support      GAIT: Findings: Gait Characteristics: step through pattern and decreased ankle dorsiflexion- Left, Distance walked: 50', Assistive device utilized:None, Level of assistance: Modified independence, and Comments: decreased push off bil. LE's due to bil. Plantarflexor weakness  FUNCTIONAL TESTS:  5 times sit to stand: 21.16 secs with bil. UE support from chair Timed up and go (TUG): 12.63 secs without device 10 meter walk test: 11.62 secs = 2.82 ft/sec without device ;   Lt Dix-Hallpike - no nystagmus noted but pt reported dizziness in test position  TREATMENT DATE: 11-19-23  Gait:  Pt gait trained with Lt Ottobock Walk on AFO on LLE;  115' without device with SBA  Gait velocity:  10.88 secs = 3.01 ft/sec without device   TherAct: 5x sit to stand transfers from chair with bil. UE support = 18.12 secs Rockerboard 10 reps x 3 sets with progressive decrease in UE support Rocking forward - stepping down to floor 5 reps each foot forward, then 5 reps each foot backward with UE support prn  NeuroRe-ed: LLE SLS activities inside // bars:  touching 3 balance bubbles 5 reps each leg with UE support prn; progressed to touching 2 cones 5 reps each leg  with SBA - use of // bar prn for assist with balance recovery  TherEx:  Leg press - bil. LE's - 70# 20 reps:      LLE only - 45# 10 reps;  40# 10 reps      PATIENT EDUCATION: Education details: reviewed theraband exercises issued during previous OP admission - pt reports no problems with this HEP and states he does them rather regularly Person educated: Patient Education method: Explanation, Demonstration, and Handouts Education comprehension: verbalized understanding and returned demonstration  HOME EXERCISE PROGRAM: To be established  GOALS: Goals reviewed with patient? Yes  SHORT TERM GOALS: Target date: 11-15-23  Perform DGA and establish LTG. Baseline: Goal status: Ongoing - will assess when pt has obtained AFO  2.  Improve 5x sit to stand to </= 18 secs with bil. UE support from chair. Baseline: 21.16 secs:  18.12 secs Goal status: Goal partially met - 11-19-23  3.  Increase gait velocity to >/= 3.0 ft/sec without device for increased gait efficiency. Baseline: 11.62 secs = 2.82 ft/sec without device:  10.88 secs = 3.01 ft/sec without device  Goal status: Goal met 11-19-23  4.  Trial of AFO for LLE for increased safety with ambulation. Baseline:  Goal status: Goal met 11-19-23  5.  Independent in HEP for LLE strengthening and balance exercises. Baseline:  Goal status: Goal met 11-19-23    LONG TERM GOALS: Target date: 12-13-23  Increase DGI score by at least 3 points for increased safety with gait and for reduced fall risk. Baseline:  Goal status: INITIAL  2.  Improve 5x sit to stand to </= 16 secs with bil. UE support from chair. Baseline: 21.16 secs;   Goal status: INITIAL  3.  Increase gait velocity to >/= 3.2 ft/sec without device for increased gait efficiency. Baseline: 11.62 secs = 2.82 ft/sec without device   Goal status: INITIAL  4.  Pt will have (-) Lt Dix-Hallpike test with no c/o vertigo to indicate resolution of Lt BPPV. Baseline:  Goal  status:  INITIAL  5.  Obtain AFO for LLE if determined to be beneficial in improving gait. Baseline:  Goal status: INITIAL  6.  Independent in updated HEP as appropriate for balance and LLE strengthening. Baseline:  Goal status: INITIAL  ASSESSMENT:  CLINICAL IMPRESSION: PT session focused on assessment of STG's: pt has met STG's #3-5;  STG #1 is ongoing as DGI not assessed due to pt not yet having obtained AFO for LLE.  Pt has appt at [facility] for AFO consult later this week.  STG #2 very closely approximated as 5x sit to stand transfer score = 18.12 secs with pt performing sit to stand from chair with bil. UE support.  Goal set at </= 18 secs with bil. UE support.  Pt is progressing well; AFO will significantly  increase safety with ambulation.  Cont with POC.   OBJECTIVE IMPAIRMENTS: decreased activity tolerance, decreased balance, difficulty walking, decreased strength, dizziness, increased edema, impaired sensation, and pain.   ACTIVITY LIMITATIONS: bending, standing, squatting, stairs, transfers, and locomotion level  PARTICIPATION LIMITATIONS: meal prep, cleaning, laundry, shopping, community activity, and yard work  PERSONAL FACTORS: Past/current experiences, Time since onset of injury/illness/exacerbation, and 1-2 comorbidities: h/o CVA, neuropathy and PVD in LLE are also affecting patient's functional outcome.   REHAB POTENTIAL: Good  CLINICAL DECISION MAKING: Evolving/moderate complexity  EVALUATION COMPLEXITY: Moderate  PLAN:  PT FREQUENCY: 1x/week  PT DURATION: 8 weeks + eval = 9 sessions  PLANNED INTERVENTIONS: 97110-Therapeutic exercises, 97530- Therapeutic activity, [id number]- Neuromuscular re-education, 97535- Self Care, [id number]- Gait training, and [phone]- Canalith repositioning  PLAN FOR NEXT SESSION: had appt at [facility]? Cont. LE strengthening and balance training   Eshika Reckart, [place], PT 11/21/2023, 6:56 PM

## 2023-11-21 ENCOUNTER — Encounter: Payer: Self-pay | Admitting: Physical Therapy

## 2023-11-25 DIAGNOSIS — E782 Mixed hyperlipidemia: Secondary | ICD-10-CM | POA: Diagnosis not present

## 2023-11-25 DIAGNOSIS — I10 Essential (primary) hypertension: Secondary | ICD-10-CM | POA: Diagnosis not present

## 2023-11-25 DIAGNOSIS — E119 Type 2 diabetes mellitus without complications: Secondary | ICD-10-CM | POA: Diagnosis not present

## 2023-11-25 DIAGNOSIS — I25118 Atherosclerotic heart disease of native coronary artery with other forms of angina pectoris: Secondary | ICD-10-CM | POA: Diagnosis not present

## 2023-11-26 ENCOUNTER — Ambulatory Visit: Attending: Neurology | Admitting: Physical Therapy

## 2023-11-26 DIAGNOSIS — M6281 Muscle weakness (generalized): Secondary | ICD-10-CM | POA: Diagnosis not present

## 2023-11-26 DIAGNOSIS — R2681 Unsteadiness on feet: Secondary | ICD-10-CM | POA: Diagnosis not present

## 2023-11-26 DIAGNOSIS — R2689 Other abnormalities of gait and mobility: Secondary | ICD-10-CM | POA: Diagnosis not present

## 2023-11-26 NOTE — Therapy (Unsigned)
 OUTPATIENT PHYSICAL THERAPY NEURO TREATMENT NOTE   Patient Name: Luis Lindsey MRN: [id number] DOB:June 06, 1950, 73 y.o., male Today's Date: 11/27/2023   PCP: Leonel Cole, MD REFERRING PROVIDER: Gayland Lauraine JINNY, NP  END OF SESSION:  PT End of Session - 11/27/23 2054     Visit Number 6    Number of Visits 9   only 6 authorized; request 4 more visits (11-26-23)   Date for PT Re-Evaluation 12/13/23    Authorization Type [employer]    Authorization Time Period 10-17-23 - 12-12-23    Authorization - Visit Number 6    Authorization - Number of Visits 6   4 additional visits requested   PT Start Time 1450    PT Stop Time 1534    PT Time Calculation (min) 44 min    Equipment Utilized During Treatment Gait belt    Activity Tolerance Patient tolerated treatment well    Behavior During Therapy WFL for tasks assessed/performed               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   Dysphagia    Esophageal dysmotility    GERD (gastroesophageal reflux disease)    Heart murmur    pt had recent echocardiogram   History of hiatal hernia    History of Holter monitoring 2009   History of kidney stones    HNP (herniated nucleus pulposus), lumbar    L4-5   HYPERCHOLESTEROLEMIA 11/30/2009   Hyperlipidemia    HYPERTENSION 11/30/2009   Kidney stones    Numbness and tingling    Peripheral vascular disease (HCC)    Right carotid bruit    SMOKER 11/30/2009   Stroke (HCC) 2012   URINARY CALCULUS 11/30/2009   Past Surgical History:  Procedure Laterality Date   ABDOMINAL AORTOGRAM W/LOWER EXTREMITY N/A 06/18/2018   Procedure: ABDOMINAL AORTOGRAM W/LOWER EXTREMITY;  Surgeon: Gretta Lonni JINNY, MD;  Location: [facility];  Service: Cardiovascular;  Laterality: N/A;   ABDOMINAL AORTOGRAM W/LOWER  EXTREMITY N/A 12/02/2019   Procedure: ABDOMINAL AORTOGRAM W/LOWER EXTREMITY;  Surgeon: Gretta Lonni JINNY, MD;  Location: [facility];  Service: Cardiovascular;  Laterality: N/A;   ABDOMINAL AORTOGRAM W/LOWER EXTREMITY N/A 03/23/2021   Procedure: ABDOMINAL AORTOGRAM W/LOWER EXTREMITY;  Surgeon: Gretta Lonni JINNY, MD;  Location: [facility];  Service: Cardiovascular;  Laterality: N/A;   AORTIC VALVE REPLACEMENT N/A 10/25/2021   Procedure: AORTIC VALVE REPLACEMENT (AVR)USING INSPIRIS RESILIA  AORTIC VALVE;  Surgeon: Shyrl Linnie KIDD, MD;  Location: [facility];  Service: Open Heart Surgery;  Laterality: N/A;   arthroscopy of right shoulder Right    Jan 2019, June 2019-- done at [facility], Dr. Vernetta   BACK SURGERY     CATARACT EXTRACTION W/ INTRAOCULAR LENS  IMPLANT, BILATERAL     CORONARY ARTERY BYPASS GRAFT N/A 10/25/2021   Procedure: CORONARY ARTERY BYPASS GRAFTING (CABG) X 3 USING LEFT INTERNAL MAMMARY ARTERY AND RIGHT GREATER SAPHENOUS VEIN;  Surgeon: Shyrl Linnie KIDD, MD;  Location: [facility];  Service: Open Heart Surgery;  Laterality: N/A;   CYSTOSCOPY     ENDARTERECTOMY Right 08/24/2019   Procedure: ENDARTERECTOMY CAROTID;  Surgeon: Gretta Lonni JINNY, MD;  Location: [facility] OR;  Service:  Vascular;  Laterality: Right;   ENDARTERECTOMY FEMORAL Left 06/05/2021   Procedure: ENDARTERECTOMY FEMORAL WITH PROFUNDAPLASTY;  Surgeon: Gretta Lonni PARAS, MD;  Location: [facility] OR;  Service: Vascular;  Laterality: Left;   ENDOVEIN HARVEST OF GREATER SAPHENOUS VEIN Right 10/25/2021   Procedure: ENDOVEIN HARVEST OF GREATER SAPHENOUS VEIN;  Surgeon: Shyrl Linnie KIDD, MD;  Location: [facility];  Service: Open Heart Surgery;  Laterality: Right;   FACIAL COSMETIC SURGERY  2014   FEMORAL-POPLITEAL BYPASS GRAFT Left 06/05/2021   Procedure: LEFT FEMORAL-POPLITEAL ARTERY BYPASS GRAFTING USING 6mm PROPATEN VASCULAR REMOVABLE RING GRAFT;  Surgeon: Gretta Lonni PARAS, MD;  Location: [facility];  Service: Vascular;  Laterality: Left;   FINGER SURGERY  2002   INGUINAL HERNIA REPAIR Left 02/01/2023   Procedure: LAPAROSCOPIC LEFT INGUINAL HERNIA REPAIR WITH MESH;  Surgeon: Lyndel Deward PARAS, MD;  Location: [facility];  Service: General;  Laterality: Left;  90   LEFT HEART CATH AND CORONARY ANGIOGRAPHY N/A 10/05/2021   Procedure: LEFT HEART CATH AND CORONARY ANGIOGRAPHY;  Surgeon: Mady Lonni, MD;  Location: [facility];  Service: Cardiovascular;  Laterality: N/A;   LUMBAR LAMINECTOMY/DECOMPRESSION MICRODISCECTOMY Left 08/01/2018   Procedure: Microdiscectomy - Lumbar four-Lumbar five - left;  Surgeon: Joshua Alm RAMAN, MD;  Location[facility];  Service: Neurosurgery;  Laterality: Left;   LUMBAR LAMINECTOMY/DECOMPRESSION MICRODISCECTOMY Left 10/15/2018   Procedure: Re-do Microdiscectomy - left - Lumbar four-Lumbar five;  Surgeon: Joshua Alm RAMAN, MD;  Location: [facility] OR;  Service: Neurosurgery;  Laterality: Left;   NECK SURGERY  1986   PATCH ANGIOPLASTY Left 06/05/2021   Procedure: PATCH ANGIOPLASTY USING GEORGE BIOLOGIC PATCH;  Surgeon: Gretta Lonni PARAS, MD;  Location: [facility] OR;  Service: Vascular;  Laterality: Left;   PERIPHERAL VASCULAR ATHERECTOMY  12/02/2019   Procedure: PERIPHERAL VASCULAR ATHERECTOMY;  Surgeon: Gretta Lonni PARAS, MD;  [facility]: [facility];  Service: Cardiovascular;;  left SFA   PERIPHERAL VASCULAR INTERVENTION  06/18/2018   Procedure: PERIPHERAL VASCULAR INTERVENTION;  Surgeon: Gretta Lonni PARAS, MD;  [facility]: [facility];  Service: Cardiovascular;;  Left external iliac   ROTATOR CUFF REPAIR Left    TEE WITHOUT CARDIOVERSION N/A 10/25/2021   Procedure: TRANSESOPHAGEAL ECHOCARDIOGRAM (TEE);  Surgeon: Shyrl Linnie KIDD, MD;  Location: [facility] OR;  Service: Open Heart Surgery;  Laterality: N/A;   UMBILICAL HERNIA REPAIR     Patient Active Problem List   Diagnosis Date Noted   Essential tremor 12/26/2022   Paresthesia 12/26/2022   Hypokalemia  03/01/2022   Hypoalbuminemia 03/01/2022   Abnormal LFTs 03/01/2022   Symptomatic anemia 02/27/2022   S/P CABG x 3 02/27/2022   Iron  deficiency anemia 02/27/2022   Complication of surgical procedure 02/13/2022   Degeneration of lumbar intervertebral disc 02/13/2022   S/P AVR (aortic valve replacement) 10/25/2021   Coronary artery disease of native artery of native heart with stable angina pectoris (HCC)    Abnormal cardiac CT angiography    PAD (peripheral artery disease) (HCC) 06/05/2021   S/P lumbar fusion 12/07/2020   Peripheral vascular disease (HCC) 04/06/2020   Cerebrovascular accident (CVA) (HCC) 04/06/2020   S/P carotid endarterectomy 04/06/2020   DM type 2 with diabetic peripheral neuropathy (HCC) 12/31/2019   Gait abnormality 12/31/2019   Tremor 12/07/2019   Carotid stenosis, asymptomatic, right 08/24/2019   Carotid disease, bilateral (HCC) 08/11/2019   S/P lumbar laminectomy 08/01/2018   PVD (peripheral vascular disease) (HCC) 06/10/2018   Left lumbar radiculopathy 04/01/2018   Peripheral neuropathy 04/01/2018   Left  leg claudication (HCC) 04/01/2018   Status post arthroscopy of right shoulder 06/27/2017   Superior glenoid labrum lesion of right shoulder 06/10/2017   Chronic right shoulder pain 04/16/2017   Displaced fracture of proximal phalanx of right great toe with routine healing 03/13/2017   Impingement syndrome of right shoulder 03/13/2017   Displaced fracture of proximal phalanx of right great toe, initial encounter for closed fracture 02/20/2017   Diabetes (HCC) 08/24/2013   History of Holter monitoring    Heart murmur    TIA (transient ischemic attack) 12/26/2012   Kidney stones    DM 11/30/2009   HYPERCHOLESTEROLEMIA 11/30/2009   Essential hypertension 11/30/2009   URINARY CALCULUS 11/30/2009   CAROTID BRUIT, RIGHT 11/30/2009    ONSET DATE: CVA 2021:  Referral date 10-03-23  REFERRING DIAG: I63.9 (ICD-10-CM) - Cerebrovascular accident (CVA),  unspecified mechanism (HCC) I73.9 (ICD-10-CM) - PAD (peripheral artery disease) (HCC) R26.9 (ICD-10-CM) - Gait abnormality  THERAPY DIAG:  Muscle weakness (generalized)  Unsteadiness on feet  Rationale for Evaluation and Treatment: Rehabilitation  SUBJECTIVE:                                                                                                                                                                                             SUBJECTIVE STATEMENT: Pt had appt at [facility] last week - goes back on 7-23 for his Lt AFO;  pt wearing slip on shoes today - did not think to wear his tennis shoes - [place] AFO will not fit in his left shoe - says he will be sure to wear his tennis shoes next time Pt accompanied by: self  PERTINENT HISTORY: 1.  Vertigo 2.  Worsening gait abnormality 3.  Tremor 4.  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  History of stroke 6.  Right carotid artery endarterectomy March 2021 7.  Significant peripheral vascular disease (He has extensive history including left external iliac stenting for claudication on 06/18/2018. Later he had left SFA stenting for claudication on 12/02/2019. Ultimately he then had left common femoral endarterectomy with profundoplasty and bovine patch as well as left common femoral to below-knee popliteal bypass with 6 mm PTFE on 06/05/2021)  PAIN:  Are you having pain? Yes: NPRS scale: 6/10 with ambulation; at rest 1-2/10 Pain location: bil. Feet and Lt leg Pain description: pins & needles in feet; achy  Aggravating factors: prolonged ambulation makes pain in Lt leg worse Relieving factors: sitting  PRECAUTIONS: Fall  RED FLAGS: None   WEIGHT BEARING RESTRICTIONS: No  FALLS: Has patient fallen in last 6 months? Yes. Number of falls 1 - fell backward  and hit kitchen countertops  LIVING ENVIRONMENT: Lives with: lives with their spouse Lives in: House/apartment Stairs: Yes: External: 2 steps; none Has following  equipment at home: Single point cane  PLOF: Independent with basic ADLs, Independent with household mobility without device, and Independent with community mobility with device  PATIENT GOALS: Improve balance & LLE strength  OBJECTIVE:  Note: Objective measures were completed at Evaluation unless otherwise noted.  DIAGNOSTIC FINDINGS: IMPRESSION: This MR angiogram of the neck arteries with and without contrast shows the following: There is 75 to 80% stenosis of the left internal carotid artery at the junction of the lacerum and cavernous segments.  This could be hemodynamically significant.  More proximally, there is mild to moderate stenosis (50%) at the origin of the left common carotid artery and mild stenosis near the origin of the left internal carotid artery that would not be hemodynamically significant. There is mild stenosis at the origin of the right common carotid artery and moderate stenosis more distally in the common carotid artery before the bifurcation.  No significant stenosis is noted in the right internal carotid artery. There is mild stenosis at the origin of the right vertebral artery that would not be hemodynamically significant. There is mild stenosis at the origin of the left vertebral artery and patchy mild to moderate the gnosis in the cervical, V3 and V4 segment of the left vertebral artery.  This is probably not hemodynamically significant.  IMPRESSION: This MR angiogram of the intracranial arteries shows the following: 80% stenosis of the left internal carotid artery at the junction of the lacerum and cavernous segments.  This could be hemodynamically significant. Minimal stenosis within the V4 segment of the left vertebral artery.  This is unlikely to be hemodynamically significant.   COGNITION: Overall cognitive status: Within functional limits for tasks assessed   SENSATION: Numbness in Lt foot; pins and needles in Rt foot - sometimes burning  sensation  COORDINATION: NT  EDEMA:  Pt wearing compression stockings bil. LE's - > edema in LLE than in RLE  POSTURE: No Significant postural limitations  LOWER EXTREMITY ROM:   WFL's bil. LE's  LOWER EXTREMITY MMT:    MMT Right Eval Left Eval  Hip flexion 4 3+  Hip extension 3 3-  Hip abduction    Hip adduction    Hip internal rotation    Hip external rotation    Knee flexion 4 3+  Knee extension 5 4  Ankle dorsiflexion 5 4-  Ankle plantarflexion    Ankle inversion    Ankle eversion    (Blank rows = not tested)  BED MOBILITY:  Findings: Indepedent  TRANSFERS: Sit to stand: Modified independence  Assistive device utilized: bil. UE support      GAIT: Findings: Gait Characteristics: step through pattern and decreased ankle dorsiflexion- Left, Distance walked: 50', Assistive device utilized:None, Level of assistance: Modified independence, and Comments: decreased push off bil. LE's due to bil. Plantarflexor weakness  FUNCTIONAL TESTS:  5 times sit to stand: 21.16 secs with bil. UE support from chair Timed up and go (TUG): 12.63 secs without device 10 meter walk test: 11.62 secs = 2.82 ft/sec without device ;   Lt Dix-Hallpike - no nystagmus noted but pt reported dizziness in test position  TREATMENT DATE: 11-26-23  NeuroRe-ed: LLE SLS activities inside // bars:  touching 3 balance bubbles 5 reps each leg with UE support prn; progressed to touching 2 cones 5 reps each leg with SBA - use of // bar prn for assist with balance recovery  Rockerboard 10 reps x 3 sets with progressive decrease in UE support Rocking forward - stepping down to floor 5 reps each foot forward, then 5 reps each foot backward with UE support prn Standing on Airex -  EO - performed stepping down to floor forward 5 reps RLE and then 5 reps LLE for improved SLS on compliant  surface on each leg - with UE support prn on bars Alternate tap ups to 1st step - 6 step - 5 reps each foot without UE support; tap ups to 2nd step 5 reps each leg with minimal UE support  for improved SLS on each leg Pt performed amb. Forward on tip toes 10' x 2 reps;  sidestepping on tip toes for gastroc strengthening 10' x 2 reps inside // bars Stepping over and back of black balance beam inside // bars - 5 reps each leg - without UE support   TherEx:  Squats - standing on Airex inside // bars - 5 reps for LE strengthening 3# weight placed on LLE - stepping up onto 6 step with RLE, then lifting LLE up to approx. 80 degrees hip flexion - 10 reps for strengthening Leg press - bil. LE's - 70# 20 reps:      LLE only - 45# 6 reps;  40# 5 reps   Heel raise bil. LE's 10 reps  PATIENT EDUCATION: Education details: reviewed theraband exercises issued during previous OP admission - pt reports no problems with this HEP and states he does them rather regularly Person educated: Patient Education method: Explanation, Demonstration, and Handouts Education comprehension: verbalized understanding and returned demonstration  HOME EXERCISE PROGRAM: To be established  GOALS: Goals reviewed with patient? Yes  SHORT TERM GOALS: Target date: 11-15-23  Perform DGA and establish LTG. Baseline: Goal status: Ongoing - will assess when pt has obtained AFO  2.  Improve 5x sit to stand to </= 18 secs with bil. UE support from chair. Baseline: 21.16 secs:  18.12 secs Goal status: Goal partially met - 11-19-23  3.  Increase gait velocity to >/= 3.0 ft/sec without device for increased gait efficiency. Baseline: 11.62 secs = 2.82 ft/sec without device:  10.88 secs = 3.01 ft/sec without device  Goal status: Goal met 11-19-23  4.  Trial of AFO for LLE for increased safety with ambulation. Baseline:  Goal status: Goal met 11-19-23  5.  Independent in HEP for LLE strengthening and balance  exercises. Baseline:  Goal status: Goal met 11-19-23    LONG TERM GOALS: Target date: 12-13-23  Increase DGI score by at least 3 points for increased safety with gait and for reduced fall risk. Baseline:  Goal status: INITIAL  2.  Improve 5x sit to stand to </= 16 secs with bil. UE support from chair. Baseline: 21.16 secs;   Goal status: INITIAL  3.  Increase gait velocity to >/= 3.2 ft/sec without device for increased gait efficiency. Baseline: 11.62 secs = 2.82 ft/sec without device   Goal status: INITIAL  4.  Pt will have (-) Lt Dix-Hallpike test with no c/o vertigo to indicate resolution of Lt BPPV. Baseline:  Goal status:  INITIAL  5.  Obtain AFO for LLE if determined to be beneficial in improving  gait. Baseline:  Goal status: INITIAL  6.  Independent in updated HEP as appropriate for balance and LLE strengthening. Baseline:  Goal status: INITIAL  ASSESSMENT:  CLINICAL IMPRESSION: PT session focused on LLE strengthening and balance training to improve LLE SLS on compliant and noncompliant surfaces.  Pt able to perform Lt hip and knee flexion in standing with use of 3# weight with stepping up onto 6 step - reported mild fatigue after this activity.  AFO on LLE unable to be used in today's session due to pt wearing slip on shoes which were not spacious enough to accommodate orthosis.  Pt is progressing well towards goals.  Cont with POC.   OBJECTIVE IMPAIRMENTS: decreased activity tolerance, decreased balance, difficulty walking, decreased strength, dizziness, increased edema, impaired sensation, and pain.   ACTIVITY LIMITATIONS: bending, standing, squatting, stairs, transfers, and locomotion level  PARTICIPATION LIMITATIONS: meal prep, cleaning, laundry, shopping, community activity, and yard work  PERSONAL FACTORS: Past/current experiences, Time since onset of injury/illness/exacerbation, and 1-2 comorbidities: h/o CVA, neuropathy and PVD in LLE are also affecting  patient's functional outcome.   REHAB POTENTIAL: Good  CLINICAL DECISION MAKING: Evolving/moderate complexity  EVALUATION COMPLEXITY: Moderate  PLAN:  PT FREQUENCY: 1x/week  PT DURATION: 8 weeks + eval = 9 sessions  PLANNED INTERVENTIONS: 97110-Therapeutic exercises, 97530- Therapeutic activity, [id number]- Neuromuscular re-education, 97535- Self Care, [id number]- Gait training, and [phone]- Canalith repositioning  PLAN FOR NEXT SESSION:  Cont. LE strengthening and balance training [facility] appt on 12-18-23  [place], PT 11/27/2023, 9:01 PM

## 2023-11-27 ENCOUNTER — Encounter: Payer: Self-pay | Admitting: Physical Therapy

## 2023-12-02 ENCOUNTER — Other Ambulatory Visit: Payer: Self-pay

## 2023-12-02 DIAGNOSIS — E782 Mixed hyperlipidemia: Secondary | ICD-10-CM

## 2023-12-02 DIAGNOSIS — I25118 Atherosclerotic heart disease of native coronary artery with other forms of angina pectoris: Secondary | ICD-10-CM

## 2023-12-02 DIAGNOSIS — Z79899 Other long term (current) drug therapy: Secondary | ICD-10-CM

## 2023-12-02 MED ORDER — EZETIMIBE 10 MG PO TABS: 10.0000 mg | ORAL_TABLET | Freq: Every day | ORAL | 2 refills | Status: AC

## 2023-12-03 ENCOUNTER — Ambulatory Visit: Admitting: Physical Therapy

## 2023-12-03 DIAGNOSIS — R2681 Unsteadiness on feet: Secondary | ICD-10-CM | POA: Diagnosis not present

## 2023-12-03 DIAGNOSIS — M6281 Muscle weakness (generalized): Secondary | ICD-10-CM | POA: Diagnosis not present

## 2023-12-03 DIAGNOSIS — R2689 Other abnormalities of gait and mobility: Secondary | ICD-10-CM | POA: Diagnosis not present

## 2023-12-03 NOTE — Therapy (Unsigned)
 OUTPATIENT PHYSICAL THERAPY NEURO TREATMENT NOTE   Patient Name: Luis Lindsey MRN: [id number] DOB:[birth year], 73 y.o., male Today's Date: 12/04/2023   PCP: Leonel Cole, MD REFERRING PROVIDER: Gayland Lauraine JINNY, NP  END OF SESSION:  PT End of Session - 12/04/23 2055     Visit Number 7    Number of Visits 10   visit 1/4 additional visits authorized   Date for PT Re-Evaluation 12/13/23    Authorization Type [facility] Medicare    Authorization Time Period 10-17-23 - 12-12-23;  12-03-23 - 12-31-23    Authorization - Visit Number 1    Authorization - Number of Visits 4   4 additional visits requested   Progress Note Due on Visit 10    PT Start Time 1448    PT Stop Time 1532    PT Time Calculation (min) 44 min    Equipment Utilized During Treatment Gait belt;Other (comment)   Lt [place] Walk on AFO   Activity Tolerance Patient tolerated treatment well    Behavior During Therapy WFL for tasks assessed/performed                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   Dysphagia    Esophageal dysmotility    GERD (gastroesophageal reflux disease)    Heart murmur    pt had recent echocardiogram   History of hiatal hernia    History of Holter monitoring 2009   History of kidney stones    HNP (herniated nucleus pulposus), lumbar    L4-5   HYPERCHOLESTEROLEMIA 11/30/2009   Hyperlipidemia    HYPERTENSION 11/30/2009   Kidney stones    Numbness and tingling    Peripheral vascular disease (HCC)    Right carotid bruit    SMOKER 11/30/2009   Stroke (HCC) 2012   URINARY CALCULUS 11/30/2009   Past Surgical History:  Procedure Laterality Date   ABDOMINAL AORTOGRAM W/LOWER EXTREMITY N/A 06/18/2018   Procedure: ABDOMINAL AORTOGRAM W/LOWER EXTREMITY;  Surgeon: Gretta Lonni JINNY, MD;  [facility]: [facility];  Service: Cardiovascular;  Laterality: N/A;   ABDOMINAL AORTOGRAM W/LOWER EXTREMITY N/A 12/02/2019   Procedure: ABDOMINAL AORTOGRAM W/LOWER EXTREMITY;  Surgeon: Gretta Lonni JINNY, MD;  Location: [facility];  Service: Cardiovascular;  Laterality: N/A;   ABDOMINAL AORTOGRAM W/LOWER EXTREMITY N/A 03/23/2021   Procedure: ABDOMINAL AORTOGRAM W/LOWER EXTREMITY;  Surgeon: Gretta Lonni JINNY, MD;  Location: [facility];  Service: Cardiovascular;  Laterality: N/A;   AORTIC VALVE REPLACEMENT N/A 10/25/2021   Procedure: AORTIC VALVE REPLACEMENT (AVR)USING INSPIRIS RESILIA  AORTIC VALVE;  Surgeon: Shyrl Linnie KIDD, MD;  Location: [facility] OR;  Service: Open Heart Surgery;  Laterality: N/A;   arthroscopy of right shoulder Right    Jan 2019, June 2019-- done at [name], Dr. Vernetta   BACK SURGERY     CATARACT EXTRACTION W/ INTRAOCULAR LENS  IMPLANT, BILATERAL     CORONARY ARTERY BYPASS GRAFT N/A 10/25/2021   Procedure: CORONARY ARTERY BYPASS GRAFTING (CABG) X 3 USING LEFT INTERNAL MAMMARY ARTERY AND RIGHT GREATER SAPHENOUS VEIN;  Surgeon: Shyrl Linnie KIDD, MD;  Location: [facility];  Service: Open Heart Surgery;  Laterality: N/A;   CYSTOSCOPY     ENDARTERECTOMY Right  08/24/2019   Procedure: ENDARTERECTOMY CAROTID;  Surgeon: Gretta Lonni PARAS, MD;  Location: [facility] OR;  Service: Vascular;  Laterality: Right;   ENDARTERECTOMY FEMORAL Left 06/05/2021   Procedure: ENDARTERECTOMY FEMORAL WITH PROFUNDAPLASTY;  Surgeon: Gretta Lonni PARAS, MD;  Location: [facility] OR;  Service: Vascular;  Laterality: Left;   ENDOVEIN HARVEST OF GREATER SAPHENOUS VEIN Right 10/25/2021   Procedure: ENDOVEIN HARVEST OF GREATER SAPHENOUS VEIN;  Surgeon: Shyrl Linnie KIDD, MD;  Location: [facility];  Service: Open Heart Surgery;  Laterality: Right;   FACIAL COSMETIC SURGERY  2014   FEMORAL-POPLITEAL BYPASS GRAFT Left 06/05/2021   Procedure: LEFT FEMORAL-POPLITEAL ARTERY BYPASS GRAFTING USING 6mm  PROPATEN VASCULAR REMOVABLE RING GRAFT;  Surgeon: Gretta Lonni PARAS, MD;  Location: [facility];  Service: Vascular;  Laterality: Left;   FINGER SURGERY  2002   INGUINAL HERNIA REPAIR Left 02/01/2023   Procedure: LAPAROSCOPIC LEFT INGUINAL HERNIA REPAIR WITH MESH;  Surgeon: Lyndel Deward PARAS, MD;  Location: [facility] ORS;  Service: General;  Laterality: Left;  90   LEFT HEART CATH AND CORONARY ANGIOGRAPHY N/A 10/05/2021   Procedure: LEFT HEART CATH AND CORONARY ANGIOGRAPHY;  Surgeon: Mady Lonni, MD;  [facility];  Service: Cardiovascular;  Laterality: N/A;   LUMBAR LAMINECTOMY/DECOMPRESSION MICRODISCECTOMY Left 08/01/2018   Procedure: Microdiscectomy - Lumbar four-Lumbar five - left;  Surgeon: Joshua Alm RAMAN, MD;  Location: [facility] OR;  Service: Neurosurgery;  Laterality: Left;   LUMBAR LAMINECTOMY/DECOMPRESSION MICRODISCECTOMY Left 10/15/2018   Procedure: Re-do Microdiscectomy - left - Lumbar four-Lumbar five;  Surgeon: Joshua Alm RAMAN, MD;  Location: [facility] OR;  Service: Neurosurgery;  Laterality: Left;   NECK SURGERY  1986   PATCH ANGIOPLASTY Left 06/05/2021   Procedure: PATCH ANGIOPLASTY USING GEORGE BIOLOGIC PATCH;  Surgeon: Gretta Lonni PARAS, MD;  Location: [facility] OR;  Service: Vascular;  Laterality: Left;   PERIPHERAL VASCULAR ATHERECTOMY  12/02/2019   Procedure: PERIPHERAL VASCULAR ATHERECTOMY;  Surgeon: Gretta Lonni PARAS, MD;  [facility];  Service: Cardiovascular;;  left SFA   PERIPHERAL VASCULAR INTERVENTION  06/18/2018   Procedure: PERIPHERAL VASCULAR INTERVENTION;  Surgeon: Gretta Lonni PARAS, MD;  [facility];  Service: Cardiovascular;;  Left external iliac   ROTATOR CUFF REPAIR Left    TEE WITHOUT CARDIOVERSION N/A 10/25/2021   Procedure: TRANSESOPHAGEAL ECHOCARDIOGRAM (TEE);  Surgeon: Shyrl Linnie KIDD, MD;  Location: [facility] OR;  Service: Open Heart Surgery;  Laterality: N/A;   UMBILICAL HERNIA REPAIR     Patient Active Problem List   Diagnosis  Date Noted   Essential tremor 12/26/2022   Paresthesia 12/26/2022   Hypokalemia 03/01/2022   Hypoalbuminemia 03/01/2022   Abnormal LFTs 03/01/2022   Symptomatic anemia 02/27/2022   S/P CABG x 3 02/27/2022   Iron  deficiency anemia 02/27/2022   Complication of surgical procedure 02/13/2022   Degeneration of lumbar intervertebral disc 02/13/2022   S/P AVR (aortic valve replacement) 10/25/2021   Coronary artery disease of native artery of native heart with stable angina pectoris (HCC)    Abnormal cardiac CT angiography    PAD (peripheral artery disease) (HCC) 06/05/2021   S/P lumbar fusion 12/07/2020   Peripheral vascular disease (HCC) 04/06/2020   Cerebrovascular accident (CVA) (HCC) 04/06/2020   S/P carotid endarterectomy 04/06/2020   DM type 2 with diabetic peripheral neuropathy (HCC) 12/31/2019   Gait abnormality 12/31/2019   Tremor 12/07/2019   Carotid stenosis, asymptomatic, right 08/24/2019   Carotid disease, bilateral (HCC) 08/11/2019   S/P lumbar laminectomy 08/01/2018   PVD (peripheral  vascular disease) (HCC) 06/10/2018   Left lumbar radiculopathy 04/01/2018   Peripheral neuropathy 04/01/2018   Left leg claudication (HCC) 04/01/2018   Status post arthroscopy of right shoulder 06/27/2017   Superior glenoid labrum lesion of right shoulder 06/10/2017   Chronic right shoulder pain 04/16/2017   Displaced fracture of proximal phalanx of right great toe with routine healing 03/13/2017   Impingement syndrome of right shoulder 03/13/2017   Displaced fracture of proximal phalanx of right great toe, initial encounter for closed fracture 02/20/2017   Diabetes (HCC) 08/24/2013   History of Holter monitoring    Heart murmur    TIA (transient ischemic attack) 12/26/2012   Kidney stones    DM 11/30/2009   HYPERCHOLESTEROLEMIA 11/30/2009   Essential hypertension 11/30/2009   URINARY CALCULUS 11/30/2009   CAROTID BRUIT, RIGHT 11/30/2009    ONSET DATE: CVA 2021:  Referral date  10-03-23  REFERRING DIAG: I63.9 (ICD-10-CM) - Cerebrovascular accident (CVA), unspecified mechanism (HCC) I73.9 (ICD-10-CM) - PAD (peripheral artery disease) (HCC) R26.9 (ICD-10-CM) - Gait abnormality  THERAPY DIAG:  Muscle weakness (generalized)  Unsteadiness on feet  Other abnormalities of gait and mobility  Rationale for Evaluation and Treatment: Rehabilitation  SUBJECTIVE:                                                                                                                                                                                             SUBJECTIVE STATEMENT: Pt had appt at [facility] last week - goes back on 7-23 for his Lt AFO;  pt wearing slip on shoes today - did not think to wear his tennis shoes - [place] AFO will not fit in his left shoe - says he will be sure to wear his tennis shoes next time Pt accompanied by: self  PERTINENT HISTORY: 1.  Vertigo 2.  Worsening gait abnormality 3.  Tremor 4.  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  History of stroke 6.  Right carotid artery endarterectomy March 2021 7.  Significant peripheral vascular disease (He has extensive history including left external iliac stenting for claudication on 06/18/2018. Later he had left SFA stenting for claudication on 12/02/2019. Ultimately he then had left common femoral endarterectomy with profundoplasty and bovine patch as well as left common femoral to below-knee popliteal bypass with 6 mm PTFE on 06/05/2021)  PAIN:  Are you having pain? Yes: NPRS scale: 6/10 with ambulation; at rest 1-2/10 Pain location: bil. Feet and Lt leg Pain description: pins & needles in feet; achy  Aggravating factors: prolonged ambulation makes pain in Lt leg worse Relieving factors: sitting  PRECAUTIONS: Fall  RED  FLAGS: None   WEIGHT BEARING RESTRICTIONS: No  FALLS: Has patient fallen in last 6 months? Yes. Number of falls 1 - fell backward and hit kitchen countertops  LIVING  ENVIRONMENT: Lives with: lives with their spouse Lives in: House/apartment Stairs: Yes: External: 2 steps; none Has following equipment at home: Single point cane  PLOF: Independent with basic ADLs, Independent with household mobility without device, and Independent with community mobility with device  PATIENT GOALS: Improve balance & LLE strength  OBJECTIVE:  Note: Objective measures were completed at Evaluation unless otherwise noted.  DIAGNOSTIC FINDINGS: IMPRESSION: This MR angiogram of the neck arteries with and without contrast shows the following: There is 75 to 80% stenosis of the left internal carotid artery at the junction of the lacerum and cavernous segments.  This could be hemodynamically significant.  More proximally, there is mild to moderate stenosis (50%) at the origin of the left common carotid artery and mild stenosis near the origin of the left internal carotid artery that would not be hemodynamically significant. There is mild stenosis at the origin of the right common carotid artery and moderate stenosis more distally in the common carotid artery before the bifurcation.  No significant stenosis is noted in the right internal carotid artery. There is mild stenosis at the origin of the right vertebral artery that would not be hemodynamically significant. There is mild stenosis at the origin of the left vertebral artery and patchy mild to moderate the gnosis in the cervical, V3 and V4 segment of the left vertebral artery.  This is probably not hemodynamically significant.  IMPRESSION: This MR angiogram of the intracranial arteries shows the following: 80% stenosis of the left internal carotid artery at the junction of the lacerum and cavernous segments.  This could be hemodynamically significant. Minimal stenosis within the V4 segment of the left vertebral artery.  This is unlikely to be hemodynamically significant.   COGNITION: Overall cognitive status: Within functional  limits for tasks assessed   SENSATION: Numbness in Lt foot; pins and needles in Rt foot - sometimes burning sensation  COORDINATION: NT  EDEMA:  Pt wearing compression stockings bil. LE's - > edema in LLE than in RLE  POSTURE: No Significant postural limitations  LOWER EXTREMITY ROM:   WFL's bil. LE's  LOWER EXTREMITY MMT:    MMT Right Eval Left Eval  Hip flexion 4 3+  Hip extension 3 3-  Hip abduction    Hip adduction    Hip internal rotation    Hip external rotation    Knee flexion 4 3+  Knee extension 5 4  Ankle dorsiflexion 5 4-  Ankle plantarflexion    Ankle inversion    Ankle eversion    (Blank rows = not tested)  BED MOBILITY:  Findings: Indepedent  TRANSFERS: Sit to stand: Modified independence  Assistive device utilized: bil. UE support      GAIT: Findings: Gait Characteristics: step through pattern and decreased ankle dorsiflexion- Left, Distance walked: 50', Assistive device utilized:None, Level of assistance: Modified independence, and Comments: decreased push off bil. LE's due to bil. Plantarflexor weakness  FUNCTIONAL TESTS:  5 times sit to stand: 21.16 secs with bil. UE support from chair Timed up and go (TUG): 12.63 secs without device 10 meter walk test: 11.62 secs = 2.82 ft/sec without device ;   Lt Dix-Hallpike - no nystagmus noted but pt reported dizziness in test position  TREATMENT DATE: 12-03-23  Gait: Pt gait trained with Lt [place] Walk on AFO on LLE;  115' without device with supervision  Curb training - pt negotiated curb without device with SBA - ascended curb with RLE, descended with RLE first; performed 3 reps with SBA  Ramp training - negotiated ramp without device with SBA - pt wearing AFO on LLE  NeuroRe-ed: LLE SLS activities inside // bars:  touching 3 balance bubbles 5 reps each leg with UE support  prn; progressed to touching 2 cones 5 reps each leg with SBA - use of // bar prn for assist with balance recovery  Rockerboard 10 reps x 3 sets with progressive decrease in UE support Rocking forward - stepping down to floor 5 reps each foot forward, then 5 reps each foot backward with UE support prn Standing on Airex -  EO - performed stepping down to floor forward 5 reps RLE and then 5 reps LLE for improved SLS on compliant surface on each leg - with UE support prn on bars  Alternate tap ups to 1st step - 6 step - 10 reps each foot with UE support on // bar prn  Stepping over and back of black balance beam inside // bars - 5 reps each leg - without UE support   TherEx: 2# weight placed on LLE - stepping up onto 6 step with RLE, then lifting LLE up to approx. 80 degrees hip flexion - 10 reps for strengthening Leg press - bil. LE's - 70# 20 reps:      LLE only - 45# 7 reps;  40# 5 reps   Heel raise bil. LE's 10 reps    PATIENT EDUCATION: Education details: reviewed theraband exercises issued during previous OP admission - pt reports no problems with this HEP and states he does them rather regularly Person educated: Patient Education method: Explanation, Demonstration, and Handouts Education comprehension: verbalized understanding and returned demonstration  HOME EXERCISE PROGRAM: To be established  GOALS: Goals reviewed with patient? Yes  SHORT TERM GOALS: Target date: 11-15-23  Perform DGA and establish LTG. Baseline: Goal status: Ongoing - will assess when pt has obtained AFO  2.  Improve 5x sit to stand to </= 18 secs with bil. UE support from chair. Baseline: 21.16 secs:  18.12 secs Goal status: Goal partially met - 11-19-23  3.  Increase gait velocity to >/= 3.0 ft/sec without device for increased gait efficiency. Baseline: 11.62 secs = 2.82 ft/sec without device:  10.88 secs = 3.01 ft/sec without device  Goal status: Goal met 11-19-23  4.  Trial of AFO for LLE for  increased safety with ambulation. Baseline:  Goal status: Goal met 11-19-23  5.  Independent in HEP for LLE strengthening and balance exercises. Baseline:  Goal status: Goal met 11-19-23    LONG TERM GOALS: Target date: 12-13-23  Increase DGI score by at least 3 points for increased safety with gait and for reduced fall risk. Baseline:  Goal status: INITIAL  2.  Improve 5x sit to stand to </= 16 secs with bil. UE support from chair. Baseline: 21.16 secs;   Goal status: INITIAL  3.  Increase gait velocity to >/= 3.2 ft/sec without device for increased gait efficiency. Baseline: 11.62 secs = 2.82 ft/sec without device   Goal status: INITIAL  4.  Pt will have (-) Lt Dix-Hallpike test with no c/o vertigo to indicate resolution of Lt BPPV. Baseline:  Goal status:  INITIAL  5.  Obtain AFO for LLE  if determined to be beneficial in improving gait. Baseline:  Goal status: INITIAL  6.  Independent in updated HEP as appropriate for balance and LLE strengthening. Baseline:  Goal status: INITIAL  ASSESSMENT:  CLINICAL IMPRESSION: PT session focused on LLE strengthening, gait training with use of Lt [place] AFO, and balance training to improve SLS on each leg.  Pt did well with curb negotiation with use of AFO on LLE - ascended and descended curb with RLE leading.  Pt has appt at [facility] on 12-18-23 for AFO delivery.  Pt is progressing well towards LTG's. Cont with POC.   OBJECTIVE IMPAIRMENTS: decreased activity tolerance, decreased balance, difficulty walking, decreased strength, dizziness, increased edema, impaired sensation, and pain.   ACTIVITY LIMITATIONS: bending, standing, squatting, stairs, transfers, and locomotion level  PARTICIPATION LIMITATIONS: meal prep, cleaning, laundry, shopping, community activity, and yard work  PERSONAL FACTORS: Past/current experiences, Time since onset of injury/illness/exacerbation, and 1-2 comorbidities: h/o CVA, neuropathy and PVD in LLE are  also affecting patient's functional outcome.   REHAB POTENTIAL: Good  CLINICAL DECISION MAKING: Evolving/moderate complexity  EVALUATION COMPLEXITY: Moderate  PLAN:  PT FREQUENCY: 1x/week  PT DURATION: 8 weeks + eval = 9 sessions  PLANNED INTERVENTIONS: 97110-Therapeutic exercises, 97530- Therapeutic activity, [id number]- Neuromuscular re-education, 97535- Self Care, [id number]- Gait training, and [phone]- Canalith repositioning  PLAN FOR NEXT SESSION: check LTG's - renew;  Cont. LE strengthening and balance training [facility] appt on 12-18-23  R[place]anna [place], PT 12/04/2023, 8:59 PM

## 2023-12-04 ENCOUNTER — Encounter: Payer: Self-pay | Admitting: Physical Therapy

## 2023-12-10 ENCOUNTER — Ambulatory Visit: Admitting: Physical Therapy

## 2023-12-10 DIAGNOSIS — M6281 Muscle weakness (generalized): Secondary | ICD-10-CM | POA: Diagnosis not present

## 2023-12-10 DIAGNOSIS — R2689 Other abnormalities of gait and mobility: Secondary | ICD-10-CM

## 2023-12-10 DIAGNOSIS — R2681 Unsteadiness on feet: Secondary | ICD-10-CM | POA: Diagnosis not present

## 2023-12-11 ENCOUNTER — Encounter: Payer: Self-pay | Admitting: Physical Therapy

## 2023-12-11 NOTE — Therapy (Signed)
 OUTPATIENT PHYSICAL THERAPY NEURO TREATMENT NOTE   Patient Name: Luis Lindsey MRN: [id number] DOB:Dec 18, 1950, 73 y.o., male Today's Date: 12/11/2023   PCP: Leonel Cole, MD REFERRING PROVIDER: Gayland Lauraine JINNY, NP  END OF SESSION:  PT End of Session - 12/11/23 1920     Visit Number 8    Number of Visits 10   visit 1/4 additional visits authorized   Date for PT Re-Evaluation 12/31/23    Authorization Type [employer]    Authorization Time Period 10-17-23 - 12-12-23;  12-03-23 - 12-31-23    Authorization - Visit Number 8    Authorization - Number of Visits 10   4 additional visits requested   Progress Note Due on Visit 10    PT Start Time 1450    PT Stop Time 1534    PT Time Calculation (min) 44 min    Equipment Utilized During Treatment Gait belt;Other (comment)   Lt [place] Walk on AFO   Activity Tolerance Patient tolerated treatment well    Behavior During Therapy WFL for tasks assessed/performed                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   Dysphagia    Esophageal dysmotility    GERD (gastroesophageal reflux disease)    Heart murmur    pt had recent echocardiogram   History of hiatal hernia    History of Holter monitoring 2009   History of kidney stones    HNP (herniated nucleus pulposus), lumbar    L4-5   HYPERCHOLESTEROLEMIA 11/30/2009   Hyperlipidemia    HYPERTENSION 11/30/2009   Kidney stones    Numbness and tingling    Peripheral vascular disease (HCC)    Right carotid bruit    SMOKER 11/30/2009   Stroke (HCC) 2012   URINARY CALCULUS 11/30/2009   Past Surgical History:  Procedure Laterality Date   ABDOMINAL AORTOGRAM W/LOWER EXTREMITY N/A 06/18/2018   Procedure: ABDOMINAL AORTOGRAM W/LOWER EXTREMITY;  Surgeon: Gretta Lonni JINNY, MD;  Location: [facility];  Service: Cardiovascular;  Laterality: N/A;   ABDOMINAL AORTOGRAM W/LOWER EXTREMITY N/A 12/02/2019   Procedure: ABDOMINAL AORTOGRAM W/LOWER EXTREMITY;  Surgeon: Gretta Lonni JINNY, MD;  Location: [facility];  Service: Cardiovascular;  Laterality: N/A;   ABDOMINAL AORTOGRAM W/LOWER EXTREMITY N/A 03/23/2021   Procedure: ABDOMINAL AORTOGRAM W/LOWER EXTREMITY;  Surgeon: Gretta Lonni JINNY, MD;  Location: [facility];  Service: Cardiovascular;  Laterality: N/A;   AORTIC VALVE REPLACEMENT N/A 10/25/2021   Procedure: AORTIC VALVE REPLACEMENT (AVR)USING INSPIRIS RESILIA  AORTIC VALVE;  Surgeon: Shyrl Linnie KIDD, MD;  Location: [facility] OR;  Service: Open Heart Surgery;  Laterality: N/A;   arthroscopy of right shoulder Right    Jan 2019, June 2019-- done at [facility], Dr. Vernetta   BACK SURGERY     CATARACT EXTRACTION W/ INTRAOCULAR LENS  IMPLANT, BILATERAL     CORONARY ARTERY BYPASS GRAFT N/A 10/25/2021   Procedure: CORONARY ARTERY BYPASS GRAFTING (CABG) X 3 USING LEFT INTERNAL MAMMARY ARTERY AND RIGHT GREATER SAPHENOUS VEIN;  Surgeon: Shyrl Linnie KIDD, MD;  Location: [facility] OR;  Service: Open Heart Surgery;  Laterality: N/A;   CYSTOSCOPY     ENDARTERECTOMY Right  08/24/2019   Procedure: ENDARTERECTOMY CAROTID;  Surgeon: Gretta Lonni PARAS, MD;  Location: [facility] OR;  Service: Vascular;  Laterality: Right;   ENDARTERECTOMY FEMORAL Left 06/05/2021   Procedure: ENDARTERECTOMY FEMORAL WITH PROFUNDAPLASTY;  Surgeon: Gretta Lonni PARAS, MD;  Location: [facility] OR;  Service: Vascular;  Laterality: Left;   ENDOVEIN HARVEST OF GREATER SAPHENOUS VEIN Right 10/25/2021   Procedure: ENDOVEIN HARVEST OF GREATER SAPHENOUS VEIN;  Surgeon: Shyrl Linnie KIDD, MD;  Location: [facility] OR;  Service: Open Heart Surgery;  Laterality: Right;   FACIAL COSMETIC SURGERY  2014   FEMORAL-POPLITEAL BYPASS GRAFT Left 06/05/2021   Procedure: LEFT FEMORAL-POPLITEAL ARTERY BYPASS GRAFTING USING  6mm PROPATEN VASCULAR REMOVABLE RING GRAFT;  Surgeon: Gretta Lonni PARAS, MD;  Location: [facility] OR;  Service: Vascular;  Laterality: Left;   FINGER SURGERY  2002   INGUINAL HERNIA REPAIR Left 02/01/2023   Procedure: LAPAROSCOPIC LEFT INGUINAL HERNIA REPAIR WITH MESH;  Surgeon: Lyndel Deward PARAS, MD;  Location: [facility] ORS;  Service: General;  Laterality: Left;  90   LEFT HEART CATH AND CORONARY ANGIOGRAPHY N/A 10/05/2021   Procedure: LEFT HEART CATH AND CORONARY ANGIOGRAPHY;  Surgeon: Mady Lonni, MD;  Location: [facility];  Service: Cardiovascular;  Laterality: N/A;   LUMBAR LAMINECTOMY/DECOMPRESSION MICRODISCECTOMY Left 08/01/2018   Procedure: Microdiscectomy - Lumbar four-Lumbar five - left;  Surgeon: Joshua Alm RAMAN, MD;  Location: [facility] OR;  Service: Neurosurgery;  Laterality: Left;   LUMBAR LAMINECTOMY/DECOMPRESSION MICRODISCECTOMY Left 10/15/2018   Procedure: Re-do Microdiscectomy - left - Lumbar four-Lumbar five;  Surgeon: Joshua Alm RAMAN, MD;  Location: [facility] OR;  Service: Neurosurgery;  Laterality: Left;   NECK SURGERY  1986   PATCH ANGIOPLASTY Left 06/05/2021   Procedure: PATCH ANGIOPLASTY USING GEORGE BIOLOGIC PATCH;  Surgeon: Gretta Lonni PARAS, MD;  Location: [facility] OR;  Service: Vascular;  Laterality: Left;   PERIPHERAL VASCULAR ATHERECTOMY  12/02/2019   Procedure: PERIPHERAL VASCULAR ATHERECTOMY;  Surgeon: Gretta Lonni PARAS, MD;  Location: [facility];  Service: Cardiovascular;;  left SFA   PERIPHERAL VASCULAR INTERVENTION  06/18/2018   Procedure: PERIPHERAL VASCULAR INTERVENTION;  Surgeon: Gretta Lonni PARAS, MD;  Location: [facility];  Service: Cardiovascular;;  Left external iliac   ROTATOR CUFF REPAIR Left    TEE WITHOUT CARDIOVERSION N/A 10/25/2021   Procedure: TRANSESOPHAGEAL ECHOCARDIOGRAM (TEE);  Surgeon: Shyrl Linnie KIDD, MD;  Location: [facility] OR;  Service: Open Heart Surgery;  Laterality: N/A;   UMBILICAL HERNIA REPAIR     Patient Active Problem List    Diagnosis Date Noted   Essential tremor 12/26/2022   Paresthesia 12/26/2022   Hypokalemia 03/01/2022   Hypoalbuminemia 03/01/2022   Abnormal LFTs 03/01/2022   Symptomatic anemia 02/27/2022   S/P CABG x 3 02/27/2022   Iron  deficiency anemia 02/27/2022   Complication of surgical procedure 02/13/2022   Degeneration of lumbar intervertebral disc 02/13/2022   S/P AVR (aortic valve replacement) 10/25/2021   Coronary artery disease of native artery of native heart with stable angina pectoris (HCC)    Abnormal cardiac CT angiography    PAD (peripheral artery disease) (HCC) 06/05/2021   S/P lumbar fusion 12/07/2020   Peripheral vascular disease (HCC) 04/06/2020   Cerebrovascular accident (CVA) (HCC) 04/06/2020   S/P carotid endarterectomy 04/06/2020   DM type 2 with diabetic peripheral neuropathy (HCC) 12/31/2019   Gait abnormality 12/31/2019   Tremor 12/07/2019   Carotid stenosis, asymptomatic, right 08/24/2019   Carotid disease, bilateral (HCC) 08/11/2019   S/P lumbar laminectomy 08/01/2018   PVD (peripheral  vascular disease) (HCC) 06/10/2018   Left lumbar radiculopathy 04/01/2018   Peripheral neuropathy 04/01/2018   Left leg claudication (HCC) 04/01/2018   Status post arthroscopy of right shoulder 06/27/2017   Superior glenoid labrum lesion of right shoulder 06/10/2017   Chronic right shoulder pain 04/16/2017   Displaced fracture of proximal phalanx of right great toe with routine healing 03/13/2017   Impingement syndrome of right shoulder 03/13/2017   Displaced fracture of proximal phalanx of right great toe, initial encounter for closed fracture 02/20/2017   Diabetes (HCC) 08/24/2013   History of Holter monitoring    Heart murmur    TIA (transient ischemic attack) 12/26/2012   Kidney stones    DM 11/30/2009   HYPERCHOLESTEROLEMIA 11/30/2009   Essential hypertension 11/30/2009   URINARY CALCULUS 11/30/2009   CAROTID BRUIT, RIGHT 11/30/2009    ONSET DATE: CVA 2021:  Referral  date 10-03-23  REFERRING DIAG: I63.9 (ICD-10-CM) - Cerebrovascular accident (CVA), unspecified mechanism (HCC) I73.9 (ICD-10-CM) - PAD (peripheral artery disease) (HCC) R26.9 (ICD-10-CM) - Gait abnormality  THERAPY DIAG:  Muscle weakness (generalized)  Unsteadiness on feet  Other abnormalities of gait and mobility  Rationale for Evaluation and Treatment: Rehabilitation  SUBJECTIVE:                                                                                                                                                                                             SUBJECTIVE STATEMENT: Pt had appt at [facility] last week - goes back on 7-23 for his Lt AFO;  pt wearing slip on shoes today - did not think to wear his tennis shoes - Ott[place]ock AFO will not fit in his left shoe - says he will be sure to wear his tennis shoes next time Pt accompanied by: self  PERTINENT HISTORY: 1.  Vertigo 2.  Worsening gait abnormality 3.  Tremor 4.  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  History of stroke 6.  Right carotid artery endarterectomy March 2021 7.  Significant peripheral vascular disease (He has extensive history including left external iliac stenting for claudication on 06/18/2018. Later he had left SFA stenting for claudication on 12/02/2019. Ultimately he then had left common femoral endarterectomy with profundoplasty and bovine patch as well as left common femoral to below-knee popliteal bypass with 6 mm PTFE on 06/05/2021)  PAIN:  Are you having pain? Yes: NPRS scale: 6/10 with ambulation; at rest 1-2/10 Pain location: bil. Feet and Lt leg Pain description: pins & needles in feet; achy  Aggravating factors: prolonged ambulation makes pain in Lt leg worse Relieving factors: sitting  PRECAUTIONS: Fall  RED  FLAGS: None   WEIGHT BEARING RESTRICTIONS: No  FALLS: Has patient fallen in last 6 months? Yes. Number of falls 1 - fell backward and hit kitchen countertops  LIVING  ENVIRONMENT: Lives with: lives with their spouse Lives in: House/apartment Stairs: Yes: External: 2 steps; none Has following equipment at home: Single point cane  PLOF: Independent with basic ADLs, Independent with household mobility without device, and Independent with community mobility with device  PATIENT GOALS: Improve balance & LLE strength  OBJECTIVE:  Note: Objective measures were completed at Evaluation unless otherwise noted.  DIAGNOSTIC FINDINGS: IMPRESSION: This MR angiogram of the neck arteries with and without contrast shows the following: There is 75 to 80% stenosis of the left internal carotid artery at the junction of the lacerum and cavernous segments.  This could be hemodynamically significant.  More proximally, there is mild to moderate stenosis (50%) at the origin of the left common carotid artery and mild stenosis near the origin of the left internal carotid artery that would not be hemodynamically significant. There is mild stenosis at the origin of the right common carotid artery and moderate stenosis more distally in the common carotid artery before the bifurcation.  No significant stenosis is noted in the right internal carotid artery. There is mild stenosis at the origin of the right vertebral artery that would not be hemodynamically significant. There is mild stenosis at the origin of the left vertebral artery and patchy mild to moderate the gnosis in the cervical, V3 and V4 segment of the left vertebral artery.  This is probably not hemodynamically significant.  IMPRESSION: This MR angiogram of the intracranial arteries shows the following: 80% stenosis of the left internal carotid artery at the junction of the lacerum and cavernous segments.  This could be hemodynamically significant. Minimal stenosis within the V4 segment of the left vertebral artery.  This is unlikely to be hemodynamically significant.   COGNITION: Overall cognitive status: Within functional  limits for tasks assessed   SENSATION: Numbness in Lt foot; pins and needles in Rt foot - sometimes burning sensation  COORDINATION: NT  EDEMA:  Pt wearing compression stockings bil. LE's - > edema in LLE than in RLE  POSTURE: No Significant postural limitations  LOWER EXTREMITY ROM:   WFL's bil. LE's  LOWER EXTREMITY MMT:    MMT Right Eval Left Eval  Hip flexion 4 3+  Hip extension 3 3-  Hip abduction    Hip adduction    Hip internal rotation    Hip external rotation    Knee flexion 4 3+  Knee extension 5 4  Ankle dorsiflexion 5 4-  Ankle plantarflexion    Ankle inversion    Ankle eversion    (Blank rows = not tested)  BED MOBILITY:  Findings: Indepedent  TRANSFERS: Sit to stand: Modified independence  Assistive device utilized: bil. UE support      GAIT: Findings: Gait Characteristics: step through pattern and decreased ankle dorsiflexion- Left, Distance walked: 50', Assistive device utilized:None, Level of assistance: Modified independence, and Comments: decreased push off bil. LE's due to bil. Plantarflexor weakness  FUNCTIONAL TESTS:  5 times sit to stand: 21.16 secs with bil. UE support from chair Timed up and go (TUG): 12.63 secs without device 10 meter walk test: 11.62 secs = 2.82 ft/sec without device ;   Lt Dix-Hallpike - no nystagmus noted but pt reported dizziness in test position  TREATMENT DATE: 12-10-23  Gait: Pt gait trained with Lt Ott[place] Walk on AFO on LLE;  115' without device with supervision   NeuroRe-ed: LLE SLS activities inside // bars:  touching 3 balance bubbles 5 reps each leg with UE support prn; progressed to touching 2 cones 5 reps each leg with SBA - use of // bar prn for assist with balance recovery  SLS activity - performed touching #4's on 4 wooden block inside // bars - with use of bar prn for  balance recovery - 5 reps each LE Rockerboard 10 reps x 3 sets with progressive decrease in UE support Rocking forward - stepping down to floor 5 reps each foot forward, then 5 reps each foot backward with UE support prn Standing on Airex -  EO - performed stepping down to floor forward 5 reps RLE and then 5 reps LLE for improved SLS on compliant surface on each leg - with UE support prn on bars   TherAct: Alternate tap ups to 1st step - 6 step - 10 reps each foot with UE support on // bar prn Standing on inverted Bosu - performed weight shifts laterally 10 reps and then anterior/posterior 10 reps with bil. UE support; placed each foot in middle of Bosu for isometric strengthening and improved SLS - moved other leg forward/back 5 reps and then laterally 5 reps with bil. UE support on // bars  Stepping over and back of black balance beam inside // bars - 5 reps each leg - without UE support   TherEx: 2# weight placed on LLE - stepping up onto 6 step with RLE, then lifting LLE up to approx. 80 degrees hip flexion - 10 reps for strengthening Leg press - bil. LE's - 70# 10 reps:      LLE only - 40# 10 reps (1 set only due to c/o fatigue in LLE)   Lt plantarflexor strengthening - LLE positioned off side of high low mat table with ball of Lt foot on floor; pt performed Lt unilateral heel raise 10 reps    PATIENT EDUCATION: Education details: reviewed theraband exercises issued during previous OP admission - pt reports no problems with this HEP and states he does them rather regularly Person educated: Patient Education method: Explanation, Demonstration, and Handouts Education comprehension: verbalized understanding and returned demonstration  HOME EXERCISE PROGRAM: To be established  GOALS: Goals reviewed with patient? Yes  SHORT TERM GOALS: Target date: 11-15-23  Perform DGA and establish LTG. Baseline: Goal status: Ongoing - will assess when pt has obtained AFO  2.  Improve 5x sit  to stand to </= 18 secs with bil. UE support from chair. Baseline: 21.16 secs:  18.12 secs Goal status: Goal partially met - 11-19-23  3.  Increase gait velocity to >/= 3.0 ft/sec without device for increased gait efficiency. Baseline: 11.62 secs = 2.82 ft/sec without device:  10.88 secs = 3.01 ft/sec without device  Goal status: Goal met 11-19-23  4.  Trial of AFO for LLE for increased safety with ambulation. Baseline:  Goal status: Goal met 11-19-23  5.  Independent in HEP for LLE strengthening and balance exercises. Baseline:  Goal status: Goal met 11-19-23    LONG TERM GOALS: Target date: 12-13-23  Increase DGI score by at least 3 points for increased safety with gait and for reduced fall risk. Baseline:  Goal status: In progress   2.  Improve 5x sit to stand to </= 16 secs with bil. UE support from chair.  Baseline: 21.16 secs;   Goal status: In progress  3.  Increase gait velocity to >/= 3.2 ft/sec without device for increased gait efficiency. Baseline: 11.62 secs = 2.82 ft/sec without device   Goal status: In progress - will be reassessed after AFO obtained  4.  Pt will have (-) Lt Dix-Hallpike test with no c/o vertigo to indicate resolution of Lt BPPV. Baseline:  Goal status:  Goal met 10-29-23  5.  Obtain AFO for LLE if determined to be beneficial in improving gait. Baseline:  Goal status: In progress - delivery scheduled for 12-18-23  6.  Independent in updated HEP as appropriate for balance and LLE strengthening. Baseline:  Goal status: Goal met 12-10-23  NEW LONG TERM GOAL DATE: 01-03-24   Increase DGI score by at least 3 points for increased safety with gait and for reduced fall risk. Baseline:  Goal status: In progress   2.  Improve 5x sit to stand to </= 16 secs with bil. UE support from chair. Baseline: 21.16 secs;   Goal status: In progress  3.  Increase gait velocity to >/= 3.2 ft/sec without device for increased gait efficiency. Baseline: 11.62 secs =  2.82 ft/sec without device   Goal status: In progress - will be reassessed after AFO obtained  4.  Obtain AFO for LLE if determined to be beneficial in improving gait. Baseline:  Goal status: In progress - delivery scheduled for 12-18-23     ASSESSMENT:  CLINICAL IMPRESSION: PT session focused on LLE strengthening, gait training with use of Lt Ott[place]ock AFO, and balance training to improve SLS on each leg with increased focus on LLE SLS.  Pt has met LTG's #4 and 6:  LTG's #1, 2 and 3 are ongoing as these goals will be assessed when pt has received his Lt AFO (delivery scheduled on 12-18-23).  Pt reported use of Lt AFO was very beneficial in providing Lt foot clearance with increased gait speed with less hesitation with initiation of gait noted in today's session, after AFO was donned on LLE.  Pt is progressing well; plan to discharge upon completion of 2 remaining authorized visits.  Cont with POC.   OBJECTIVE IMPAIRMENTS: decreased activity tolerance, decreased balance, difficulty walking, decreased strength, dizziness, increased edema, impaired sensation, and pain.   ACTIVITY LIMITATIONS: bending, standing, squatting, stairs, transfers, and locomotion level  PARTICIPATION LIMITATIONS: meal prep, cleaning, laundry, shopping, community activity, and yard work  PERSONAL FACTORS: Past/current experiences, Time since onset of injury/illness/exacerbation, and 1-2 comorbidities: h/o CVA, neuropathy and PVD in LLE are also affecting patient's functional outcome.   REHAB POTENTIAL: Good  CLINICAL DECISION MAKING: Evolving/moderate complexity  EVALUATION COMPLEXITY: Moderate  PLAN:  PT FREQUENCY: 1x/week  PT DURATION: 8 weeks + eval = 9 sessions  PLANNED INTERVENTIONS: 97110-Therapeutic exercises, 97530- Therapeutic activity, [id number]- Neuromuscular re-education, 97535- Self Care, [id number]- Gait training, and [id number]- Canalith repositioning  PLAN FOR NEXT SESSION: Cont. LE strengthening and balance  training [facility] appt on 12-18-23  [place]anna [place], PT 12/11/2023, 7:26 PM

## 2023-12-17 ENCOUNTER — Ambulatory Visit: Admitting: Physical Therapy

## 2023-12-17 DIAGNOSIS — E1151 Type 2 diabetes mellitus with diabetic peripheral angiopathy without gangrene: Secondary | ICD-10-CM | POA: Diagnosis not present

## 2023-12-17 DIAGNOSIS — E11649 Type 2 diabetes mellitus with hypoglycemia without coma: Secondary | ICD-10-CM | POA: Diagnosis not present

## 2023-12-17 DIAGNOSIS — N1832 Chronic kidney disease, stage 3b: Secondary | ICD-10-CM | POA: Diagnosis not present

## 2023-12-17 DIAGNOSIS — E782 Mixed hyperlipidemia: Secondary | ICD-10-CM | POA: Diagnosis not present

## 2023-12-17 DIAGNOSIS — I10 Essential (primary) hypertension: Secondary | ICD-10-CM | POA: Diagnosis not present

## 2023-12-17 DIAGNOSIS — E113291 Type 2 diabetes mellitus with mild nonproliferative diabetic retinopathy without macular edema, right eye: Secondary | ICD-10-CM | POA: Diagnosis not present

## 2023-12-17 DIAGNOSIS — E1165 Type 2 diabetes mellitus with hyperglycemia: Secondary | ICD-10-CM | POA: Diagnosis not present

## 2023-12-17 DIAGNOSIS — R809 Proteinuria, unspecified: Secondary | ICD-10-CM | POA: Diagnosis not present

## 2023-12-18 DIAGNOSIS — M21372 Foot drop, left foot: Secondary | ICD-10-CM | POA: Diagnosis not present

## 2023-12-23 ENCOUNTER — Ambulatory Visit: Payer: Self-pay | Admitting: Physical Therapy

## 2023-12-23 DIAGNOSIS — R2681 Unsteadiness on feet: Secondary | ICD-10-CM

## 2023-12-23 DIAGNOSIS — R2689 Other abnormalities of gait and mobility: Secondary | ICD-10-CM

## 2023-12-23 DIAGNOSIS — M6281 Muscle weakness (generalized): Secondary | ICD-10-CM | POA: Diagnosis not present

## 2023-12-23 NOTE — Therapy (Unsigned)
 OUTPATIENT PHYSICAL THERAPY NEURO TREATMENT NOTE   Patient Name: Luis Lindsey MRN: [id number] DOB:12/26/1950, 73 y.o., male Today's Date: 12/24/2023   PCP: Leonel Cole, MD REFERRING PROVIDER: Gayland Lauraine JINNY, NP  END OF SESSION:  PT End of Session - 12/24/23 0818     Visit Number 9    Number of Visits 10   visit 3/4 additional visits authorized   Date for PT Re-Evaluation 12/31/23    Authorization Type [employer]    Authorization Time Period 10-17-23 - 12-12-23;  12-03-23 - 12-31-23 4 additional visits approved    Authorization - Visit Number 9    Authorization - Number of Visits 10   4 additional visits requested   Progress Note Due on Visit 10    PT Start Time 1106    PT Stop Time 1150    PT Time Calculation (min) 44 min    Equipment Utilized During Treatment Gait belt    Activity Tolerance Patient tolerated treatment well    Behavior During Therapy WFL for tasks assessed/performed                 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   Dysphagia    Esophageal dysmotility    GERD (gastroesophageal reflux disease)    Heart murmur    pt had recent echocardiogram   History of hiatal hernia    History of Holter monitoring 2009   History of kidney stones    HNP (herniated nucleus pulposus), lumbar    L4-5   HYPERCHOLESTEROLEMIA 11/30/2009   Hyperlipidemia    HYPERTENSION 11/30/2009   Kidney stones    Numbness and tingling    Peripheral vascular disease (HCC)    Right carotid bruit    SMOKER 11/30/2009   Stroke (HCC) 2012   URINARY CALCULUS 11/30/2009   Past Surgical History:  Procedure Laterality Date   ABDOMINAL AORTOGRAM W/LOWER EXTREMITY N/A 06/18/2018   Procedure: ABDOMINAL AORTOGRAM W/LOWER EXTREMITY;  Surgeon: Gretta Lonni JINNY, MD;  Location: [facility];   Service: Cardiovascular;  Laterality: N/A;   ABDOMINAL AORTOGRAM W/LOWER EXTREMITY N/A 12/02/2019   Procedure: ABDOMINAL AORTOGRAM W/LOWER EXTREMITY;  Surgeon: Gretta Lonni JINNY, MD;  Location: [facility];  Service: Cardiovascular;  Laterality: N/A;   ABDOMINAL AORTOGRAM W/LOWER EXTREMITY N/A 03/23/2021   Procedure: ABDOMINAL AORTOGRAM W/LOWER EXTREMITY;  Surgeon: Gretta Lonni JINNY, MD;  Location: [facility];  Service: Cardiovascular;  Laterality: N/A;   AORTIC VALVE REPLACEMENT N/A 10/25/2021   Procedure: AORTIC VALVE REPLACEMENT (AVR)USING INSPIRIS RESILIA  AORTIC VALVE;  Surgeon: Shyrl Linnie KIDD, MD;  Location: [facility];  Service: Open Heart Surgery;  Laterality: N/A;   arthroscopy of right shoulder Right    Jan 2019, June 2019-- done at [facility], Dr. Vernetta   BACK SURGERY     CATARACT EXTRACTION W/ INTRAOCULAR LENS  IMPLANT, BILATERAL     CORONARY ARTERY BYPASS GRAFT N/A 10/25/2021   Procedure: CORONARY ARTERY BYPASS GRAFTING (CABG) X 3 USING LEFT INTERNAL MAMMARY ARTERY AND RIGHT GREATER SAPHENOUS VEIN;  Surgeon: Shyrl Linnie KIDD, MD;  Location: [facility];  Service: Open Heart Surgery;  Laterality: N/A;   CYSTOSCOPY     ENDARTERECTOMY Right 08/24/2019  Procedure: ENDARTERECTOMY CAROTID;  Surgeon: Gretta Lonni PARAS, MD;  Location: [facility] OR;  Service: Vascular;  Laterality: Right;   ENDARTERECTOMY FEMORAL Left 06/05/2021   Procedure: ENDARTERECTOMY FEMORAL WITH PROFUNDAPLASTY;  Surgeon: Gretta Lonni PARAS, MD;  Location: [facility] OR;  Service: Vascular;  Laterality: Left;   ENDOVEIN HARVEST OF GREATER SAPHENOUS VEIN Right 10/25/2021   Procedure: ENDOVEIN HARVEST OF GREATER SAPHENOUS VEIN;  Surgeon: Shyrl Linnie KIDD, MD;  Location: [facility];  Service: Open Heart Surgery;  Laterality: Right;   FACIAL COSMETIC SURGERY  2014   FEMORAL-POPLITEAL BYPASS GRAFT Left 06/05/2021   Procedure: LEFT FEMORAL-POPLITEAL ARTERY BYPASS GRAFTING USING 6mm PROPATEN  VASCULAR REMOVABLE RING GRAFT;  Surgeon: Gretta Lonni PARAS, MD;  Location: [facility];  Service: Vascular;  Laterality: Left;   FINGER SURGERY  2002   INGUINAL HERNIA REPAIR Left 02/01/2023   Procedure: LAPAROSCOPIC LEFT INGUINAL HERNIA REPAIR WITH MESH;  Surgeon: Lyndel Deward PARAS, MD;  Location: [facility];  Service: General;  Laterality: Left;  90   LEFT HEART CATH AND CORONARY ANGIOGRAPHY N/A 10/05/2021   Procedure: LEFT HEART CATH AND CORONARY ANGIOGRAPHY;  Surgeon: Mady Lonni, MD;  Location: [facility];  Service: Cardiovascular;  Laterality: N/A;   LUMBAR LAMINECTOMY/DECOMPRESSION MICRODISCECTOMY Left 08/01/2018   Procedure: Microdiscectomy - Lumbar four-Lumbar five - left;  Surgeon: Joshua Alm RAMAN, MD;  Location: [facility] OR;  Service: Neurosurgery;  Laterality: Left;   LUMBAR LAMINECTOMY/DECOMPRESSION MICRODISCECTOMY Left 10/15/2018   Procedure: Re-do Microdiscectomy - left - Lumbar four-Lumbar five;  Surgeon: Joshua Alm RAMAN, MD;  Location: [facility] OR;  Service: Neurosurgery;  Laterality: Left;   NECK SURGERY  1986   PATCH ANGIOPLASTY Left 06/05/2021   Procedure: PATCH ANGIOPLASTY USING GEORGE BIOLOGIC PATCH;  Surgeon: Gretta Lonni PARAS, MD;  Location: [facility] OR;  Service: Vascular;  Laterality: Left;   PERIPHERAL VASCULAR ATHERECTOMY  12/02/2019   Procedure: PERIPHERAL VASCULAR ATHERECTOMY;  Surgeon: Gretta Lonni PARAS, MD;  [facility]: [facility];  Service: Cardiovascular;;  left SFA   PERIPHERAL VASCULAR INTERVENTION  06/18/2018   Procedure: PERIPHERAL VASCULAR INTERVENTION;  Surgeon: Gretta Lonni PARAS, MD;  [facility]: [facility];  Service: Cardiovascular;;  Left external iliac   ROTATOR CUFF REPAIR Left    TEE WITHOUT CARDIOVERSION N/A 10/25/2021   Procedure: TRANSESOPHAGEAL ECHOCARDIOGRAM (TEE);  Surgeon: Shyrl Linnie KIDD, MD;  Location: [facility] OR;  Service: Open Heart Surgery;  Laterality: N/A;   UMBILICAL HERNIA REPAIR     Patient Active Problem List   Diagnosis Date  Noted   Essential tremor 12/26/2022   Paresthesia 12/26/2022   Hypokalemia 03/01/2022   Hypoalbuminemia 03/01/2022   Abnormal LFTs 03/01/2022   Symptomatic anemia 02/27/2022   S/P CABG x 3 02/27/2022   Iron  deficiency anemia 02/27/2022   Complication of surgical procedure 02/13/2022   Degeneration of lumbar intervertebral disc 02/13/2022   S/P AVR (aortic valve replacement) 10/25/2021   Coronary artery disease of native artery of native heart with stable angina pectoris (HCC)    Abnormal cardiac CT angiography    PAD (peripheral artery disease) (HCC) 06/05/2021   S/P lumbar fusion 12/07/2020   Peripheral vascular disease (HCC) 04/06/2020   Cerebrovascular accident (CVA) (HCC) 04/06/2020   S/P carotid endarterectomy 04/06/2020   DM type 2 with diabetic peripheral neuropathy (HCC) 12/31/2019   Gait abnormality 12/31/2019   Tremor 12/07/2019   Carotid stenosis, asymptomatic, right 08/24/2019   Carotid disease, bilateral (HCC) 08/11/2019   S/P lumbar laminectomy 08/01/2018   PVD (peripheral vascular disease) (HCC)  06/10/2018   Left lumbar radiculopathy 04/01/2018   Peripheral neuropathy 04/01/2018   Left leg claudication (HCC) 04/01/2018   Status post arthroscopy of right shoulder 06/27/2017   Superior glenoid labrum lesion of right shoulder 06/10/2017   Chronic right shoulder pain 04/16/2017   Displaced fracture of proximal phalanx of right great toe with routine healing 03/13/2017   Impingement syndrome of right shoulder 03/13/2017   Displaced fracture of proximal phalanx of right great toe, initial encounter for closed fracture 02/20/2017   Diabetes (HCC) 08/24/2013   History of Holter monitoring    Heart murmur    TIA (transient ischemic attack) 12/26/2012   Kidney stones    DM 11/30/2009   HYPERCHOLESTEROLEMIA 11/30/2009   Essential hypertension 11/30/2009   URINARY CALCULUS 11/30/2009   CAROTID BRUIT, RIGHT 11/30/2009    ONSET DATE: CVA 2021:  Referral date  10-03-23  REFERRING DIAG: I63.9 (ICD-10-CM) - Cerebrovascular accident (CVA), unspecified mechanism (HCC) I73.9 (ICD-10-CM) - PAD (peripheral artery disease) (HCC) R26.9 (ICD-10-CM) - Gait abnormality  THERAPY DIAG:  Muscle weakness (generalized)  Unsteadiness on feet  Other abnormalities of gait and mobility  Rationale for Evaluation and Treatment: Rehabilitation  SUBJECTIVE:                                                                                                                                                                                             SUBJECTIVE STATEMENT: Pt received his toe off AFO for LLE from [facility] - had appt last Wed., the 23rd and received it; states he is almost up to wearing entire day but not quite yet; pt reports no problems or issues with the brace - says he is now able to walk without worrying about his foot catching and tripping him Pt accompanied by: self  PERTINENT HISTORY: 1.  Vertigo 2.  Worsening gait abnormality 3.  Tremor 4.  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  History of stroke 6.  Right carotid artery endarterectomy March 2021 7.  Significant peripheral vascular disease (He has extensive history including left external iliac stenting for claudication on 06/18/2018. Later he had left SFA stenting for claudication on 12/02/2019. Ultimately he then had left common femoral endarterectomy with profundoplasty and bovine patch as well as left common femoral to below-knee popliteal bypass with 6 mm PTFE on 06/05/2021)  PAIN:  Are you having pain? Yes: NPRS scale: 6/10 with ambulation; at rest 1-2/10 Pain location: bil. Feet and Lt leg Pain description: pins & needles in feet; achy  Aggravating factors: prolonged ambulation makes pain in Lt leg worse Relieving factors: sitting  PRECAUTIONS: Fall  RED FLAGS: None   WEIGHT BEARING RESTRICTIONS: No  FALLS: Has patient fallen in last 6 months? Yes. Number of falls 1 - fell  backward and hit kitchen countertops  LIVING ENVIRONMENT: Lives with: lives with their spouse Lives in: House/apartment Stairs: Yes: External: 2 steps; none Has following equipment at home: Single point cane  PLOF: Independent with basic ADLs, Independent with household mobility without device, and Independent with community mobility with device  PATIENT GOALS: Improve balance & LLE strength  OBJECTIVE:  Note: Objective measures were completed at Evaluation unless otherwise noted.  DIAGNOSTIC FINDINGS: IMPRESSION: This MR angiogram of the neck arteries with and without contrast shows the following: There is 75 to 80% stenosis of the left internal carotid artery at the junction of the lacerum and cavernous segments.  This could be hemodynamically significant.  More proximally, there is mild to moderate stenosis (50%) at the origin of the left common carotid artery and mild stenosis near the origin of the left internal carotid artery that would not be hemodynamically significant. There is mild stenosis at the origin of the right common carotid artery and moderate stenosis more distally in the common carotid artery before the bifurcation.  No significant stenosis is noted in the right internal carotid artery. There is mild stenosis at the origin of the right vertebral artery that would not be hemodynamically significant. There is mild stenosis at the origin of the left vertebral artery and patchy mild to moderate the gnosis in the cervical, V3 and V4 segment of the left vertebral artery.  This is probably not hemodynamically significant.  IMPRESSION: This MR angiogram of the intracranial arteries shows the following: 80% stenosis of the left internal carotid artery at the junction of the lacerum and cavernous segments.  This could be hemodynamically significant. Minimal stenosis within the V4 segment of the left vertebral artery.  This is unlikely to be hemodynamically  significant.   COGNITION: Overall cognitive status: Within functional limits for tasks assessed   SENSATION: Numbness in Lt foot; pins and needles in Rt foot - sometimes burning sensation  COORDINATION: NT  EDEMA:  Pt wearing compression stockings bil. LE's - > edema in LLE than in RLE  POSTURE: No Significant postural limitations  LOWER EXTREMITY ROM:   WFL's bil. LE's  LOWER EXTREMITY MMT:    MMT Right Eval Left Eval  Hip flexion 4 3+  Hip extension 3 3-  Hip abduction    Hip adduction    Hip internal rotation    Hip external rotation    Knee flexion 4 3+  Knee extension 5 4  Ankle dorsiflexion 5 4-  Ankle plantarflexion    Ankle inversion    Ankle eversion    (Blank rows = not tested)  BED MOBILITY:  Findings: Indepedent  TRANSFERS: Sit to stand: Modified independence  Assistive device utilized: bil. UE support      GAIT: Findings: Gait Characteristics: step through pattern and decreased ankle dorsiflexion- Left, Distance walked: 50', Assistive device utilized:None, Level of assistance: Modified independence, and Comments: decreased push off bil. LE's due to bil. Plantarflexor weakness  FUNCTIONAL TESTS:  5 times sit to stand: 21.16 secs with bil. UE support from chair Timed up and go (TUG): 12.63 secs without device 10 meter walk test: 11.62 secs = 2.82 ft/sec without device ;   Lt Dix-Hallpike - no nystagmus noted but pt reported dizziness in test position  TREATMENT DATE: 12-23-23  NeuroRe-ed: LLE SLS activities performed at bar at mirrored wall; pt performed touching 2 balance bubbles 5 reps each leg with UE support prn; progressed to touching diagonally 5 reps each foot with UE support prn  Alternating tap ups to 6 step 5 reps each leg without UE support for improved SLS on each leg Rockerboard 10 reps x 2 sets with progressive  decrease in UE support Rocking forward - stepping down to floor 5 reps each foot forward, then 5 reps each foot backward with UE support prn Standing on Airex -  EO - performed horizontal head turns 5 reps with CGA; performed stepping down to floor forward 5 reps each leg for improved SLS on compliant surface  Pt performed rolling medium sized purple ball forward/back 10 reps with each leg, rolling laterally 10 reps with each leg for improved balance on each leg    TherEx: Squats bil. LE's 10 reps without UE support 2# weight placed on LLE - stepping up onto 6 step with RLE, then lifting LLE up to approx. 80 degrees hip flexion - 10 reps for strengthening Leg press - bil. LE's - 70# 10 reps:      LLE only - 50# 10 reps, 2 sets  Lt plantarflexor strengthening - LLE only 10 reps with UE support on counter  Gait velocity = 10.47 secs = 3.13 ft/sec-  without device (pt wearing AFO on LLE)   PATIENT EDUCATION: Education details: reviewed theraband exercises issued during previous OP admission - pt reports no problems with this HEP and states he does them rather regularly Person educated: Patient Education method: Explanation, Demonstration, and Handouts Education comprehension: verbalized understanding and returned demonstration  HOME EXERCISE PROGRAM: To be established  GOALS: Goals reviewed with patient? Yes  SHORT TERM GOALS: Target date: 11-15-23  Perform DGA and establish LTG. Baseline: Goal status: Ongoing - will assess when pt has obtained AFO  2.  Improve 5x sit to stand to </= 18 secs with bil. UE support from chair. Baseline: 21.16 secs:  18.12 secs Goal status: Goal partially met - 11-19-23  3.  Increase gait velocity to >/= 3.0 ft/sec without device for increased gait efficiency. Baseline: 11.62 secs = 2.82 ft/sec without device:  10.88 secs = 3.01 ft/sec without device  Goal status: Goal met 11-19-23  4.  Trial of AFO for LLE for increased safety with  ambulation. Baseline:  Goal status: Goal met 11-19-23  5.  Independent in HEP for LLE strengthening and balance exercises. Baseline:  Goal status: Goal met 11-19-23    LONG TERM GOALS: Target date: 12-13-23  Increase DGI score by at least 3 points for increased safety with gait and for reduced fall risk. Baseline:  Goal status: In progress   2.  Improve 5x sit to stand to </= 16 secs with bil. UE support from chair. Baseline: 21.16 secs;   Goal status: In progress  3.  Increase gait velocity to >/= 3.2 ft/sec without device for increased gait efficiency. Baseline: 11.62 secs = 2.82 ft/sec without device   Goal status: In progress - will be reassessed after AFO obtained  4.  Pt will have (-) Lt Dix-Hallpike test with no c/o vertigo to indicate resolution of Lt BPPV. Baseline:  Goal status:  Goal met 10-29-23  5.  Obtain AFO for LLE if determined to be beneficial in improving gait. Baseline:  Goal status: In progress - delivery scheduled for 12-18-23  6.  Independent in updated HEP as  appropriate for balance and LLE strengthening. Baseline:  Goal status: Goal met 12-10-23  NEW LONG TERM GOAL DATE: 01-03-24   Increase DGI score by at least 3 points for increased safety with gait and for reduced fall risk. Baseline:  Goal status: In progress   2.  Improve 5x sit to stand to </= 16 secs with bil. UE support from chair. Baseline: 21.16 secs;   Goal status: In progress  3.  Increase gait velocity to >/= 3.2 ft/sec without device for increased gait efficiency. Baseline: 11.62 secs = 2.82 ft/sec without device   Goal status: In progress - will be reassessed after AFO obtained  4.  Obtain AFO for LLE if determined to be beneficial in improving gait. Baseline:  Goal status: In progress - delivery scheduled for 12-18-23    ASSESSMENT:  CLINICAL IMPRESSION: PT session focused on LLE strengthening and balance activities to improve SLS on each leg.  Pt recently received his [place]  AFO for LLE and reports feeling more confident and safe with ambulation with less fear of tripping as pt now has increased clearance of Lt foot in swing phase of gait.  LLE remains slightly weaker than RLE in overall strength and also slightly decreased SLS noted on LLE compared to that on RLE.  Pt is progressing well towards LTG's.  Cont with POC.   OBJECTIVE IMPAIRMENTS: decreased activity tolerance, decreased balance, difficulty walking, decreased strength, dizziness, increased edema, impaired sensation, and pain.   ACTIVITY LIMITATIONS: bending, standing, squatting, stairs, transfers, and locomotion level  PARTICIPATION LIMITATIONS: meal prep, cleaning, laundry, shopping, community activity, and yard work  PERSONAL FACTORS: Past/current experiences, Time since onset of injury/illness/exacerbation, and 1-2 comorbidities: h/o CVA, neuropathy and PVD in LLE are also affecting patient's functional outcome.   REHAB POTENTIAL: Good  CLINICAL DECISION MAKING: Evolving/moderate complexity  EVALUATION COMPLEXITY: Moderate  PLAN:  PT FREQUENCY: 1x/week  PT DURATION: 8 weeks + eval = 9 sessions  PLANNED INTERVENTIONS: 97110-Therapeutic exercises, 97530- Therapeutic activity, [id number]- Neuromuscular re-education, 97535- Self Care, [id number]- Gait training, and [phone]- Canalith repositioning  PLAN FOR NEXT SESSION: Check LTG's and D/C next session;  Cont. LE strengthening and balance training   Chaddrick Brue, [place], PT 12/24/2023, 7:10 PM

## 2023-12-24 ENCOUNTER — Encounter: Payer: Self-pay | Admitting: Physical Therapy

## 2023-12-24 ENCOUNTER — Ambulatory Visit: Admitting: Physical Therapy

## 2023-12-26 DIAGNOSIS — E782 Mixed hyperlipidemia: Secondary | ICD-10-CM | POA: Diagnosis not present

## 2023-12-26 DIAGNOSIS — E119 Type 2 diabetes mellitus without complications: Secondary | ICD-10-CM | POA: Diagnosis not present

## 2023-12-26 DIAGNOSIS — I10 Essential (primary) hypertension: Secondary | ICD-10-CM | POA: Diagnosis not present

## 2023-12-26 DIAGNOSIS — I25118 Atherosclerotic heart disease of native coronary artery with other forms of angina pectoris: Secondary | ICD-10-CM | POA: Diagnosis not present

## 2023-12-30 DIAGNOSIS — D509 Iron deficiency anemia, unspecified: Secondary | ICD-10-CM | POA: Diagnosis not present

## 2024-01-02 ENCOUNTER — Ambulatory Visit: Payer: Self-pay | Attending: Neurology | Admitting: Physical Therapy

## 2024-01-02 DIAGNOSIS — R2689 Other abnormalities of gait and mobility: Secondary | ICD-10-CM | POA: Diagnosis not present

## 2024-01-02 DIAGNOSIS — R2681 Unsteadiness on feet: Secondary | ICD-10-CM | POA: Insufficient documentation

## 2024-01-02 DIAGNOSIS — R809 Proteinuria, unspecified: Secondary | ICD-10-CM | POA: Diagnosis not present

## 2024-01-02 DIAGNOSIS — E1165 Type 2 diabetes mellitus with hyperglycemia: Secondary | ICD-10-CM | POA: Diagnosis not present

## 2024-01-02 DIAGNOSIS — M6281 Muscle weakness (generalized): Secondary | ICD-10-CM | POA: Insufficient documentation

## 2024-01-02 NOTE — Therapy (Signed)
 OUTPATIENT PHYSICAL THERAPY NEURO TREATMENT NOTE/10TH VISIT PROGRESS NOTE/DISCHARGE SUMMARY    Progress Note Reporting Period 10-17-23 to 01-02-24  See note below for Objective Data and Assessment of Progress/Goals.  Thank you for the referral of this patient.      Patient Name: Luis Lindsey MRN: [id number] DOB:04/24/1951, 73 y.o., male Today's Date: 01/02/2024   PCP: Leonel Cole, MD REFERRING PROVIDER: Gayland Lauraine JINNY, NP  END OF SESSION:  PT End of Session - 01/05/24 1915     Visit Number 10    Number of Visits 10   visit 3/4 additional visits authorized   Date for PT Re-Evaluation 12/31/23    Authorization Type [employer]    Authorization Time Period 10-17-23 - 12-12-23;  12-03-23 - 12-31-23 4 additional visits approved    Authorization - Visit Number 10    Authorization - Number of Visits 10   4 additional visits requested   Progress Note Due on Visit 10    PT Start Time 1402    PT Stop Time 1446    PT Time Calculation (min) 44 min    Equipment Utilized During Treatment Gait belt    Activity Tolerance Patient tolerated treatment well    Behavior During Therapy WFL for tasks assessed/performed                  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   Dysphagia    Esophageal dysmotility    GERD (gastroesophageal reflux disease)    Heart murmur    pt had recent echocardiogram   History of hiatal hernia    History of Holter monitoring 2009   History of kidney stones    HNP (herniated nucleus pulposus), lumbar    L4-5   HYPERCHOLESTEROLEMIA 11/30/2009   Hyperlipidemia    HYPERTENSION 11/30/2009   Kidney stones    Numbness and tingling    Peripheral vascular disease (HCC)    Right carotid bruit    SMOKER 11/30/2009   Stroke (HCC) 2012   URINARY CALCULUS 11/30/2009    Past Surgical History:  Procedure Laterality Date   ABDOMINAL AORTOGRAM W/LOWER EXTREMITY N/A 06/18/2018   Procedure: ABDOMINAL AORTOGRAM W/LOWER EXTREMITY;  Surgeon: Gretta Lonni JINNY, MD;  Location: [facility];  Service: Cardiovascular;  Laterality: N/A;   ABDOMINAL AORTOGRAM W/LOWER EXTREMITY N/A 12/02/2019   Procedure: ABDOMINAL AORTOGRAM W/LOWER EXTREMITY;  Surgeon: Gretta Lonni JINNY, MD;  Location: [facility];  Service: Cardiovascular;  Laterality: N/A;   ABDOMINAL AORTOGRAM W/LOWER EXTREMITY N/A 03/23/2021   Procedure: ABDOMINAL AORTOGRAM W/LOWER EXTREMITY;  Surgeon: Gretta Lonni JINNY, MD;  Location: [facility];  Service: Cardiovascular;  Laterality: N/A;   AORTIC VALVE REPLACEMENT N/A 10/25/2021   Procedure: AORTIC VALVE REPLACEMENT (AVR)USING INSPIRIS RESILIA  AORTIC VALVE;  Surgeon: Shyrl Linnie KIDD, MD;  Location: [facility];  Service: Open Heart Surgery;  Laterality: N/A;   arthroscopy of right shoulder Right    Jan 2019, June 2019-- done at [facility], Dr. Vernetta   BACK SURGERY     CATARACT EXTRACTION W/ INTRAOCULAR LENS  IMPLANT, BILATERAL     CORONARY ARTERY BYPASS GRAFT N/A 10/25/2021   Procedure: CORONARY ARTERY BYPASS GRAFTING (CABG) X 3 USING  LEFT INTERNAL MAMMARY ARTERY AND RIGHT GREATER SAPHENOUS VEIN;  Surgeon: Shyrl Linnie KIDD, MD;  Location: [facility];  Service: Open Heart Surgery;  Laterality: N/A;   CYSTOSCOPY     ENDARTERECTOMY Right 08/24/2019   Procedure: ENDARTERECTOMY CAROTID;  Surgeon: Gretta Lonni PARAS, MD;  Location: [facility] OR;  Service: Vascular;  Laterality: Right;   ENDARTERECTOMY FEMORAL Left 06/05/2021   Procedure: ENDARTERECTOMY FEMORAL WITH PROFUNDAPLASTY;  Surgeon: Gretta Lonni PARAS, MD;  Location: [facility] OR;  Service: Vascular;  Laterality: Left;   ENDOVEIN HARVEST OF GREATER SAPHENOUS VEIN Right 10/25/2021   Procedure: ENDOVEIN HARVEST OF GREATER SAPHENOUS VEIN;  Surgeon: Shyrl Linnie KIDD, MD;   Location: [facility];  Service: Open Heart Surgery;  Laterality: Right;   FACIAL COSMETIC SURGERY  2014   FEMORAL-POPLITEAL BYPASS GRAFT Left 06/05/2021   Procedure: LEFT FEMORAL-POPLITEAL ARTERY BYPASS GRAFTING USING 6mm PROPATEN VASCULAR REMOVABLE RING GRAFT;  Surgeon: Gretta Lonni PARAS, MD;  Location: [facility];  Service: Vascular;  Laterality: Left;   FINGER SURGERY  2002   INGUINAL HERNIA REPAIR Left 02/01/2023   Procedure: LAPAROSCOPIC LEFT INGUINAL HERNIA REPAIR WITH MESH;  Surgeon: Lyndel Deward PARAS, MD;  Location: [facility];  Service: General;  Laterality: Left;  90   LEFT HEART CATH AND CORONARY ANGIOGRAPHY N/A 10/05/2021   Procedure: LEFT HEART CATH AND CORONARY ANGIOGRAPHY;  Surgeon: Mady Lonni, MD;  [facility]: [facility];  Service: Cardiovascular;  Laterality: N/A;   LUMBAR LAMINECTOMY/DECOMPRESSION MICRODISCECTOMY Left 08/01/2018   Procedure: Microdiscectomy - Lumbar four-Lumbar five - left;  Surgeon: Joshua Alm RAMAN, MD;  Location: [facility] OR;  Service: Neurosurgery;  Laterality: Left;   LUMBAR LAMINECTOMY/DECOMPRESSION MICRODISCECTOMY Left 10/15/2018   Procedure: Re-do Microdiscectomy - left - Lumbar four-Lumbar five;  Surgeon: Joshua Alm RAMAN, MD;  Location: [facility] OR;  Service: Neurosurgery;  Laterality: Left;   NECK SURGERY  1986   PATCH ANGIOPLASTY Left 06/05/2021   Procedure: PATCH ANGIOPLASTY USING GEORGE BIOLOGIC PATCH;  Surgeon: Gretta Lonni PARAS, MD;  Location: [facility] OR;  Service: Vascular;  Laterality: Left;   PERIPHERAL VASCULAR ATHERECTOMY  12/02/2019   Procedure: PERIPHERAL VASCULAR ATHERECTOMY;  Surgeon: Gretta Lonni PARAS, MD;  [facility];  Service: Cardiovascular;;  left SFA   PERIPHERAL VASCULAR INTERVENTION  06/18/2018   Procedure: PERIPHERAL VASCULAR INTERVENTION;  Surgeon: Gretta Lonni PARAS, MD;  [facility]: [facility];  Service: Cardiovascular;;  Left external iliac   ROTATOR CUFF REPAIR Left    TEE WITHOUT CARDIOVERSION N/A 10/25/2021    Procedure: TRANSESOPHAGEAL ECHOCARDIOGRAM (TEE);  Surgeon: Shyrl Linnie KIDD, MD;  Location: [facility] OR;  Service: Open Heart Surgery;  Laterality: N/A;   UMBILICAL HERNIA REPAIR     Patient Active Problem List   Diagnosis Date Noted   Essential tremor 12/26/2022   Paresthesia 12/26/2022   Hypokalemia 03/01/2022   Hypoalbuminemia 03/01/2022   Abnormal LFTs 03/01/2022   Symptomatic anemia 02/27/2022   S/P CABG x 3 02/27/2022   Iron  deficiency anemia 02/27/2022   Complication of surgical procedure 02/13/2022   Degeneration of lumbar intervertebral disc 02/13/2022   S/P AVR (aortic valve replacement) 10/25/2021   Coronary artery disease of native artery of native heart with stable angina pectoris (HCC)    Abnormal cardiac CT angiography    PAD (peripheral artery disease) (HCC) 06/05/2021   S/P lumbar fusion 12/07/2020   Peripheral vascular disease (HCC) 04/06/2020   Cerebrovascular accident (CVA) (HCC) 04/06/2020   S/P carotid endarterectomy 04/06/2020   DM type 2 with diabetic peripheral  neuropathy (HCC) 12/31/2019   Gait abnormality 12/31/2019   Tremor 12/07/2019   Carotid stenosis, asymptomatic, right 08/24/2019   Carotid disease, bilateral (HCC) 08/11/2019   S/P lumbar laminectomy 08/01/2018   PVD (peripheral vascular disease) (HCC) 06/10/2018   Left lumbar radiculopathy 04/01/2018   Peripheral neuropathy 04/01/2018   Left leg claudication (HCC) 04/01/2018   Status post arthroscopy of right shoulder 06/27/2017   Superior glenoid labrum lesion of right shoulder 06/10/2017   Chronic right shoulder pain 04/16/2017   Displaced fracture of proximal phalanx of right great toe with routine healing 03/13/2017   Impingement syndrome of right shoulder 03/13/2017   Displaced fracture of proximal phalanx of right great toe, initial encounter for closed fracture 02/20/2017   Diabetes (HCC) 08/24/2013   History of Holter monitoring    Heart murmur    TIA (transient ischemic attack)  12/26/2012   Kidney stones    DM 11/30/2009   HYPERCHOLESTEROLEMIA 11/30/2009   Essential hypertension 11/30/2009   URINARY CALCULUS 11/30/2009   CAROTID BRUIT, RIGHT 11/30/2009    ONSET DATE: CVA 2021:  Referral date 10-03-23  REFERRING DIAG: I63.9 (ICD-10-CM) - Cerebrovascular accident (CVA), unspecified mechanism (HCC) I73.9 (ICD-10-CM) - PAD (peripheral artery disease) (HCC) R26.9 (ICD-10-CM) - Gait abnormality  THERAPY DIAG:  Muscle weakness (generalized)  Unsteadiness on feet  Other abnormalities of gait and mobility  Rationale for Evaluation and Treatment: Rehabilitation  SUBJECTIVE:                                                                                                                                                                                             SUBJECTIVE STATEMENT: Pt reports he is doing well - is wearing his AFO all day now - really helps to improve his walking because he doesn't have to worry about toes catching.  Ready to discharge today as planned Pt accompanied by: self  PERTINENT HISTORY: 1.  Vertigo 2.  Worsening gait abnormality 3.  Tremor 4.  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  History of stroke 6.  Right carotid artery endarterectomy March 2021 7.  Significant peripheral vascular disease (He has extensive history including left external iliac stenting for claudication on 06/18/2018. Later he had left SFA stenting for claudication on 12/02/2019. Ultimately he then had left common femoral endarterectomy with profundoplasty and bovine patch as well as left common femoral to below-knee popliteal bypass with 6 mm PTFE on 06/05/2021)  PAIN:  Are you having pain? Yes: NPRS scale: 6/10 with ambulation; at rest 1-2/10 Pain location: bil. Feet and Lt leg Pain description: pins & needles in feet; achy  Aggravating factors: prolonged ambulation makes pain in Lt leg worse Relieving factors: sitting  PRECAUTIONS: Fall  RED  FLAGS: None   WEIGHT BEARING RESTRICTIONS: No  FALLS: Has patient fallen in last 6 months? Yes. Number of falls 1 - fell backward and hit kitchen countertops  LIVING ENVIRONMENT: Lives with: lives with their spouse Lives in: House/apartment Stairs: Yes: External: 2 steps; none Has following equipment at home: Single point cane  PLOF: Independent with basic ADLs, Independent with household mobility without device, and Independent with community mobility with device  PATIENT GOALS: Improve balance & LLE strength  OBJECTIVE:  Note: Objective measures were completed at Evaluation unless otherwise noted.  DIAGNOSTIC FINDINGS: IMPRESSION: This MR angiogram of the neck arteries with and without contrast shows the following: There is 75 to 80% stenosis of the left internal carotid artery at the junction of the lacerum and cavernous segments.  This could be hemodynamically significant.  More proximally, there is mild to moderate stenosis (50%) at the origin of the left common carotid artery and mild stenosis near the origin of the left internal carotid artery that would not be hemodynamically significant. There is mild stenosis at the origin of the right common carotid artery and moderate stenosis more distally in the common carotid artery before the bifurcation.  No significant stenosis is noted in the right internal carotid artery. There is mild stenosis at the origin of the right vertebral artery that would not be hemodynamically significant. There is mild stenosis at the origin of the left vertebral artery and patchy mild to moderate the gnosis in the cervical, V3 and V4 segment of the left vertebral artery.  This is probably not hemodynamically significant.  IMPRESSION: This MR angiogram of the intracranial arteries shows the following: 80% stenosis of the left internal carotid artery at the junction of the lacerum and cavernous segments.  This could be hemodynamically significant. Minimal  stenosis within the V4 segment of the left vertebral artery.  This is unlikely to be hemodynamically significant.   COGNITION: Overall cognitive status: Within functional limits for tasks assessed   SENSATION: Numbness in Lt foot; pins and needles in Rt foot - sometimes burning sensation  COORDINATION: NT  EDEMA:  Pt wearing compression stockings bil. LE's - > edema in LLE than in RLE  POSTURE: No Significant postural limitations  LOWER EXTREMITY ROM:   WFL's bil. LE's  LOWER EXTREMITY MMT:    MMT Right Eval Left Eval  Hip flexion 4 3+  Hip extension 3 3-  Hip abduction    Hip adduction    Hip internal rotation    Hip external rotation    Knee flexion 4 3+  Knee extension 5 4  Ankle dorsiflexion 5 4-  Ankle plantarflexion    Ankle inversion    Ankle eversion    (Blank rows = not tested)  BED MOBILITY:  Findings: Indepedent  TRANSFERS: Sit to stand: Modified independence  Assistive device utilized: bil. UE support      GAIT: Findings: Gait Characteristics: step through pattern and decreased ankle dorsiflexion- Left, Distance walked: 50', Assistive device utilized:None, Level of assistance: Modified independence, and Comments: decreased push off bil. LE's due to bil. Plantarflexor weakness  FUNCTIONAL TESTS:  5 times sit to stand: 21.16 secs with bil. UE support from chair Timed up and go (TUG): 12.63 secs without device 10 meter walk test: 11.62 secs = 2.82 ft/sec without device ;   Lt Dix-Hallpike - no nystagmus noted but pt reported dizziness in  test position                                                                                                                              TREATMENT DATE: 01-02-24  Gait:    01/02/24 0001  Dynamic Gait Index  Level Surface 3  Change in Gait Speed 3  Gait with Horizontal Head Turns 2  Gait with Vertical Head Turns 2  Gait and Pivot Turn 3  Step Over Obstacle 3  Step Around Obstacles 3  Steps 2  Total Score 21    Gait velocity =  9.78 secs =  3.35 ft/sec without device with pt wearing AFO on LLE  NeuroRe-ed:    Access Code: [email or web address] URL: [email or web address] Date: 01/05/2024 Prepared by: Rock Kussmaul  Exercises - Standing Hip Flexion with Resistance  - 1 x daily - 7 x weekly - 1 sets - 10 reps - 3 sec hold - Hip and Knee Flexion with Anchored Resistance  - 1 x daily - 7 x weekly - 1 sets - 10 reps - 3 sec hold - Standing Hip Extension with Resistance  - 1 x daily - 7 x weekly - 3 sets - 10 reps - 3 sec hold - Standing Hip Abduction with Theraband Resistance  - 1 x daily - 7 x weekly - 1 sets - 10 reps - 3 sec hold - Sit to Stand  - 1 x daily - 7 x weekly - 1 sets - 10 reps - Heel Raises with Counter Support  - 1 x daily - 7 x weekly - 1 sets - 10 reps - Mini Squat  - 1 x daily - 7 x weekly - 1 sets - 10 reps - Standing Marching  - 1 x daily - 7 x weekly - 3 sets - 10 reps - Single Leg Stance with Support  - 1 x daily - 7 x weekly - 1 sets - 1-2 reps - 10 sec hold  TherEx: 5x Sit to stand score 14.91 secs with bil. UE support  Leg press - bil. LE's - 70# 10 reps:      LLE only - 50# 10 reps, 2 sets   Gait velocity = 10.47 secs = 3.13 ft/sec-  without device (pt wearing AFO on LLE)   PATIENT EDUCATION: Education details: reviewed theraband exercises issued during previous OP admission - pt reports no problems with this HEP and states he does them rather regularly Person educated: Patient Education method: Explanation, Demonstration, and Handouts Education comprehension: verbalized understanding and returned demonstration  HOME EXERCISE PROGRAM: To be established  GOALS: Goals reviewed with patient? Yes  SHORT TERM GOALS: Target date: 11-15-23  Perform DGA and establish LTG. Baseline: Goal status: Ongoing - will assess when pt has obtained AFO  2.  Improve 5x sit to stand to </= 18 secs with bil. UE support from chair. Baseline: 21.16 secs:  18.12 secs Goal  status: Goal partially met -  11-19-23  3.  Increase gait velocity to >/= 3.0 ft/sec without device for increased gait efficiency. Baseline: 11.62 secs = 2.82 ft/sec without device:  10.88 secs = 3.01 ft/sec without device  Goal status: Goal met 11-19-23  4.  Trial of AFO for LLE for increased safety with ambulation. Baseline:  Goal status: Goal met 11-19-23  5.  Independent in HEP for LLE strengthening and balance exercises. Baseline:  Goal status: Goal met 11-19-23    LONG TERM GOALS: Target date: 12-13-23  Increase DGI score by at least 3 points for increased safety with gait and for reduced fall risk. Baseline:  Goal status: In progress   2.  Improve 5x sit to stand to </= 16 secs with bil. UE support from chair. Baseline: 21.16 secs;   Goal status: In progress  3.  Increase gait velocity to >/= 3.2 ft/sec without device for increased gait efficiency. Baseline: 11.62 secs = 2.82 ft/sec without device   Goal status: In progress - will be reassessed after AFO obtained  4.  Pt will have (-) Lt Dix-Hallpike test with no c/o vertigo to indicate resolution of Lt BPPV. Baseline:  Goal status:  Goal met 10-29-23  5.  Obtain AFO for LLE if determined to be beneficial in improving gait. Baseline:  Goal status: In progress - delivery scheduled for 12-18-23  6.  Independent in updated HEP as appropriate for balance and LLE strengthening. Baseline:  Goal status: Goal met 12-10-23  NEW LONG TERM GOAL DATE: 01-03-24   Increase DGI score by at least 3 points for increased safety with gait and for reduced fall risk. Baseline: 21/24 on 01-02-24 Goal status: Goal met 01-02-24  2.  Improve 5x sit to stand to </= 16 secs with bil. UE support from chair. Baseline: 21.16 secs;  17.53 secs with RUE support; 14.91 secs with bil. UE support Goal status: MET 01-02-24  3.  Increase gait velocity to >/= 3.2 ft/sec without device for increased gait efficiency. Baseline: 11.62 secs = 2.82 ft/sec without  device;  9.78 secs =  3.35 ft/sec Goal status: Goal met 01-02-24  4.  Obtain AFO for LLE if determined to be beneficial in improving gait. Baseline:  Goal status: Goal met 01-02-24    ASSESSMENT:  CLINICAL IMPRESSION: This 10th visit progress note covers dates 10-17-23 - 01-02-24; pt has met 4/4 updated LTG's.  DGI score = 21/24.  Pt has received a toe off AFO of LLE which significantly increases safety with gait by providing increased Lt foot/toe clearance in swing phase of gait.  Pt has made excellent progress in PT. Pt is discharged due to all goals achieved and no further needs identified at this time which warrant skilled PT intervention.  Pt agrees with discharge.  OBJECTIVE IMPAIRMENTS: decreased activity tolerance, decreased balance, difficulty walking, decreased strength, dizziness, increased edema, impaired sensation, and pain.   ACTIVITY LIMITATIONS: bending, standing, squatting, stairs, transfers, and locomotion level  PARTICIPATION LIMITATIONS: meal prep, cleaning, laundry, shopping, community activity, and yard work  PERSONAL FACTORS: Past/current experiences, Time since onset of injury/illness/exacerbation, and 1-2 comorbidities: h/o CVA, neuropathy and PVD in LLE are also affecting patient's functional outcome.   REHAB POTENTIAL: Good  CLINICAL DECISION MAKING: Evolving/moderate complexity  EVALUATION COMPLEXITY: Moderate  PLAN:  PT FREQUENCY: 1x/week  PT DURATION: 8 weeks + eval = 9 sessions  PLANNED INTERVENTIONS: 97110-Therapeutic exercises, 97530- Therapeutic activity, [id number]- Neuromuscular re-education, 97535- Self Care, [id number]- Gait training, and [phone]- Canalith repositioning  PLAN FOR NEXT SESSION:  D/C on 01-02-24  PHYSICAL THERAPY DISCHARGE SUMMARY  Visits from Start of Care: 10  Current functional level related to goals / functional outcomes: See above for progress towards goals   Remaining deficits: Continued LLE weakness Continued decreased high level  balance skills   Education / Equipment: Pt has received toe off AFO for LLE. Pt has been educated in HEP for LLE strengthening and balance exercises.    Patient agrees to discharge. Patient goals were met. Patient is being discharged due to meeting the stated rehab goals.    Roxanna [place], PT 01/05/2024, 7:18 PM

## 2024-01-05 ENCOUNTER — Encounter: Payer: Self-pay | Admitting: Physical Therapy

## 2024-01-05 NOTE — Progress Notes (Signed)
   01/02/24 0001  Dynamic Gait Index  Level Surface 3  Change in Gait Speed 3  Gait with Horizontal Head Turns 2  Gait with Vertical Head Turns 2  Gait and Pivot Turn 3  Step Over Obstacle 3  Step Around Obstacles 3  Steps 2  Total Score 21

## 2024-01-07 ENCOUNTER — Encounter (HOSPITAL_COMMUNITY): Payer: Medicare Other

## 2024-01-07 ENCOUNTER — Ambulatory Visit: Payer: Medicare Other | Admitting: Vascular Surgery

## 2024-01-08 DIAGNOSIS — E1165 Type 2 diabetes mellitus with hyperglycemia: Secondary | ICD-10-CM | POA: Diagnosis not present

## 2024-01-14 ENCOUNTER — Ambulatory Visit: Admitting: Vascular Surgery

## 2024-01-14 ENCOUNTER — Ambulatory Visit (HOSPITAL_BASED_OUTPATIENT_CLINIC_OR_DEPARTMENT_OTHER)
Admission: RE | Admit: 2024-01-14 | Discharge: 2024-01-14 | Disposition: A | Discharge status: Home / Self Care | Source: Ambulatory Visit | Attending: Vascular Surgery | Admitting: Vascular Surgery

## 2024-01-14 ENCOUNTER — Encounter: Payer: Self-pay | Admitting: Vascular Surgery

## 2024-01-14 ENCOUNTER — Ambulatory Visit (HOSPITAL_COMMUNITY)
Admission: RE | Admit: 2024-01-14 | Discharge: 2024-01-14 | Disposition: A | Discharge status: Home / Self Care | Source: Ambulatory Visit | Attending: Vascular Surgery | Admitting: Vascular Surgery

## 2024-01-14 VITALS — BP 127/56 | HR 63 | Temp 97.8°F | Resp 18 | Ht 70.0 in | Wt 154.3 lb

## 2024-01-14 DIAGNOSIS — I739 Peripheral vascular disease, unspecified: Secondary | ICD-10-CM

## 2024-01-14 DIAGNOSIS — I6523 Occlusion and stenosis of bilateral carotid arteries: Secondary | ICD-10-CM

## 2024-01-14 LAB — VAS US ABI WITH/WO TBI
Left ABI: 1.01
Right ABI: 0.59

## 2024-01-14 NOTE — Progress Notes (Signed)
 Patient name: Luis Lindsey MRN: [id number] DOB: October 29, 1950 Sex: male  REASON FOR VISIT: 6 month follow-up PAD and carotid artery disease   HPI: Luis Lindsey is a 73 y.o. male well-known to our practice that presents for 6 month follow-up for interval surveillance of his PAD   No new concerns today.  States his legs are doing well. No recent stroke or TIA symptoms given previous right carotid endarterectomy.  He has extensive history including left external iliac stenting for claudication on 06/18/2018.  Later he had left SFA stenting for claudication on 12/02/2019.  Ultimately he then had left common femoral endarterectomy with profundoplasty and bovine patch as well as left common femoral to below-knee popliteal bypass with 6 mm PTFE on 06/05/2021.    He has also had a right carotid endarterectomy on 08/24/2019.   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   Dysphagia    Esophageal dysmotility    GERD (gastroesophageal reflux disease)    Heart murmur    pt had recent echocardiogram   History of hiatal hernia    History of Holter monitoring 2009   History of kidney stones    HNP (herniated nucleus pulposus), lumbar    L4-5   HYPERCHOLESTEROLEMIA 11/30/2009   Hyperlipidemia    HYPERTENSION 11/30/2009   Kidney stones    Numbness and tingling    Peripheral vascular disease (HCC)    Right carotid bruit    SMOKER 11/30/2009   Stroke (HCC) 2012   URINARY CALCULUS 11/30/2009    Past Surgical History:  Procedure Laterality Date   ABDOMINAL AORTOGRAM W/LOWER EXTREMITY N/A 06/18/2018   Procedure: ABDOMINAL AORTOGRAM W/LOWER EXTREMITY;  Surgeon: Gretta Lonni JINNY, MD;  Location: [facility];  Service: Cardiovascular;  Laterality: N/A;   ABDOMINAL AORTOGRAM W/LOWER EXTREMITY N/A 12/02/2019    Procedure: ABDOMINAL AORTOGRAM W/LOWER EXTREMITY;  Surgeon: Gretta Lonni JINNY, MD;  Location: [facility];  Service: Cardiovascular;  Laterality: N/A;   ABDOMINAL AORTOGRAM W/LOWER EXTREMITY N/A 03/23/2021   Procedure: ABDOMINAL AORTOGRAM W/LOWER EXTREMITY;  Surgeon: Gretta Lonni JINNY, MD;  Location: [facility];  Service: Cardiovascular;  Laterality: N/A;   AORTIC VALVE REPLACEMENT N/A 10/25/2021   Procedure: AORTIC VALVE REPLACEMENT (AVR)USING INSPIRIS RESILIA  AORTIC VALVE;  Surgeon: Shyrl Linnie KIDD, MD;  Location: [facility];  Service: Open Heart Surgery;  Laterality: N/A;   arthroscopy of right shoulder Right    Jan 2019, June 2019-- done at [facility], Dr. Vernetta   BACK SURGERY     CATARACT EXTRACTION W/ INTRAOCULAR LENS  IMPLANT, BILATERAL     CORONARY ARTERY BYPASS GRAFT N/A 10/25/2021   Procedure: CORONARY ARTERY BYPASS GRAFTING (CABG) X 3 USING LEFT INTERNAL MAMMARY ARTERY AND RIGHT GREATER SAPHENOUS VEIN;  Surgeon: Shyrl Linnie KIDD, MD;  Location: [facility];  Service: Open Heart Surgery;  Laterality: N/A;   CYSTOSCOPY     ENDARTERECTOMY Right 08/24/2019   Procedure: ENDARTERECTOMY CAROTID;  Surgeon: Gretta Lonni JINNY, MD;  Location: [facility] OR;  Service: Vascular;  Laterality: Right;   ENDARTERECTOMY FEMORAL Left 06/05/2021   Procedure: ENDARTERECTOMY FEMORAL WITH PROFUNDAPLASTY;  Surgeon: Gretta Lonni JINNY, MD;  Location: [facility] OR;  Service: Vascular;  Laterality: Left;  ENDOVEIN HARVEST OF GREATER SAPHENOUS VEIN Right 10/25/2021   Procedure: ENDOVEIN HARVEST OF GREATER SAPHENOUS VEIN;  Surgeon: Shyrl Linnie KIDD, MD;  Location: [facility];  Service: Open Heart Surgery;  Laterality: Right;   FACIAL COSMETIC SURGERY  2014   FEMORAL-POPLITEAL BYPASS GRAFT Left 06/05/2021   Procedure: LEFT FEMORAL-POPLITEAL ARTERY BYPASS GRAFTING USING 6mm PROPATEN VASCULAR REMOVABLE RING GRAFT;  Surgeon: Gretta Lonni PARAS, MD;  Location: [facility];  Service: Vascular;   Laterality: Left;   FINGER SURGERY  2002   INGUINAL HERNIA REPAIR Left 02/01/2023   Procedure: LAPAROSCOPIC LEFT INGUINAL HERNIA REPAIR WITH MESH;  Surgeon: Lyndel Deward PARAS, MD;  Location: [facility];  Service: General;  Laterality: Left;  90   LEFT HEART CATH AND CORONARY ANGIOGRAPHY N/A 10/05/2021   Procedure: LEFT HEART CATH AND CORONARY ANGIOGRAPHY;  Surgeon: Mady Lonni, MD;  Location: [facility];  Service: Cardiovascular;  Laterality: N/A;   LUMBAR LAMINECTOMY/DECOMPRESSION MICRODISCECTOMY Left 08/01/2018   Procedure: Microdiscectomy - Lumbar four-Lumbar five - left;  Surgeon: Joshua Alm RAMAN, MD;  Location: [facility] OR;  Service: Neurosurgery;  Laterality: Left;   LUMBAR LAMINECTOMY/DECOMPRESSION MICRODISCECTOMY Left 10/15/2018   Procedure: Re-do Microdiscectomy - left - Lumbar four-Lumbar five;  Surgeon: Joshua Alm RAMAN, MD;  Location: [facility] OR;  Service: Neurosurgery;  Laterality: Left;   NECK SURGERY  1986   PATCH ANGIOPLASTY Left 06/05/2021   Procedure: PATCH ANGIOPLASTY USING GEORGE BIOLOGIC PATCH;  Surgeon: Gretta Lonni PARAS, MD;  Location: [facility] OR;  Service: Vascular;  Laterality: Left;   PERIPHERAL VASCULAR ATHERECTOMY  12/02/2019   Procedure: PERIPHERAL VASCULAR ATHERECTOMY;  Surgeon: Gretta Lonni PARAS, MD;  [facility]: [facility];  Service: Cardiovascular;;  left SFA   PERIPHERAL VASCULAR INTERVENTION  06/18/2018   Procedure: PERIPHERAL VASCULAR INTERVENTION;  Surgeon: Gretta Lonni PARAS, MD;  Location: [facility];  Service: Cardiovascular;;  Left external iliac   ROTATOR CUFF REPAIR Left    TEE WITHOUT CARDIOVERSION N/A 10/25/2021   Procedure: TRANSESOPHAGEAL ECHOCARDIOGRAM (TEE);  Surgeon: Shyrl Linnie KIDD, MD;  Location: [facility] OR;  Service: Open Heart Surgery;  Laterality: N/A;   UMBILICAL HERNIA REPAIR      Family History  Problem Relation Age of Onset   Diabetes Sister    Diabetes Mellitus I Sister    Stroke Father    Heart disease Father    CVA Father     Coronary artery disease Father    Diabetes Mother    Heart disease Mother    Diabetes Mellitus I Mother    Coronary artery disease Mother    Coronary artery disease Brother    Heart disease Brother    Coronary artery disease Brother     SOCIAL HISTORY: Social History   Tobacco Use   Smoking status: Former    Current packs/day: 0.00    Types: Cigarettes    Start date: 12/1975    Quit date: 12/2015    Years since quitting: 8.0    Passive exposure: Current (Wife Is a smoker)   Smokeless tobacco: Never   Tobacco comments:    Quit 12-2012  Substance Use Topics   Alcohol use: No    Allergies  Allergen Reactions   Codeine Nausea Only    Current Outpatient Medications  Medication Sig Dispense Refill   acetaminophen  (TYLENOL ) 500 MG tablet Take 1,000 mg by mouth every 6 (six) hours as needed for mild pain.     ALPRAZolam  (XANAX ) 0.5 MG tablet Take 1-2 tablets 30 minutes prior  to MRI, may repeat once as needed. Must have driver. 3 tablet 0   amLODipine  (NORVASC ) 10 MG tablet Take 1 tablet (10 mg total) by mouth daily. 90 tablet 3   aspirin  EC 81 MG EC tablet Take 1 tablet (81 mg total) by mouth daily at 6 (six) AM.     benazepril  (LOTENSIN ) 20 MG tablet Take 1 tablet (20 mg total) by mouth daily. (Patient taking differently: Take 20 mg by mouth in the morning and at bedtime.) 90 tablet 2   clopidogrel  (PLAVIX ) 75 MG tablet TAKE 1 TABLET BY MOUTH EVERY DAY 90 tablet 3   ezetimibe  (ZETIA ) 10 MG tablet Take 1 tablet (10 mg total) by mouth daily. 90 tablet 2   ferrous sulfate  325 (65 FE) MG tablet Take 325 mg by mouth daily.     gabapentin  (NEURONTIN ) 100 MG capsule Take 100-200 mg by mouth See admin instructions. Take 100 mg by mouth in the morning and 200 mg at bedtime     insulin  degludec (TRESIBA ) 200 UNIT/ML FlexTouch Pen Inject 34 Units into the skin in the morning. (Patient taking differently: Inject 16 Units into the skin in the morning.)     iron  polysaccharides (NIFEREX)  150 MG capsule Take 1 capsule (150 mg total) by mouth daily. 30 capsule 0   metFORMIN  (GLUCOPHAGE -XR) 500 MG 24 hr tablet Take 2 tablets (1,000 mg total) by mouth 2 (two) times daily.     NOVOLOG  FLEXPEN 100 UNIT/ML FlexPen INJECT UP TO 10 UNITS BEFORE MEALS SUBCUTANEOUS THREE TIMES DAILY 30 DAYS     ONETOUCH VERIO test strip 1 each 3 (three) times daily.     pantoprazole  (PROTONIX ) 40 MG tablet Take 40 mg by mouth daily with breakfast.      pioglitazone  (ACTOS ) 45 MG tablet TAKE 1 TABLET DAILY (NEED APPOINTMENT FOR FURTHER REFILLS) 90 tablet 0   polyethylene glycol (MIRALAX  / GLYCOLAX ) 17 g packet Take 17 g by mouth daily as needed for moderate constipation.     propranolol  (INDERAL ) 20 MG tablet Take 1 tablet (20 mg total) by mouth 2 (two) times daily. 60 tablet 6   rosuvastatin  (CRESTOR ) 40 MG tablet Take 1 tablet (40 mg total) by mouth daily. 90 tablet 3   No current facility-administered medications for this visit.    REVIEW OF SYSTEMS:  [X]  denotes positive finding, [ ]  denotes negative finding Cardiac  Comments:  Chest pain or chest pressure:    Shortness of breath upon exertion:    Short of breath when lying flat:    Irregular heart rhythm:        Vascular    Pain in calf, thigh, or hip brought on by ambulation:    Pain in feet at night that wakes you up from your sleep:     Blood clot in your veins:    Leg swelling:         Pulmonary    Oxygen at home:    Productive cough:     Wheezing:         Neurologic    Sudden weakness in arms or legs:     Sudden numbness in arms or legs:     Sudden onset of difficulty speaking or slurred speech:    Temporary loss of vision in one eye:     Problems with dizziness:         Gastrointestinal    Blood in stool:     Vomited blood:  Genitourinary    Burning when urinating:     Blood in urine:        Psychiatric    Major depression:         Hematologic    Bleeding problems:    Problems with blood clotting too easily:         Skin    Rashes or ulcers:        Constitutional    Fever or chills:      PHYSICAL EXAM: Vitals:   01/14/24 1007 01/14/24 1009  BP: (!) 149/57 (!) 127/56  Pulse: 63   Resp: 18   Temp: 97.8 F (36.6 C)   TempSrc: Temporal   SpO2: 100%   Weight: 154 lb 4.8 oz (70 kg)   Height: 5' 10 (1.778 m)     GENERAL: The patient is a well-nourished male, in no acute distress. The vital signs are documented above. CARDIAC: There is a regular rate and rhythm.  VASCULAR:  Left groin incision well healed Bilateral femoral pulses palpable Left PT palpable No palpable right pedal pulses but no tissue loss PULMONARY: No respiratory distress. ABDOMEN: Soft and non-tender. MUSCULOSKELETAL: There are no major deformities or cyanosis. NEUROLOGIC: No focal weakness or paresthesias are detected. PSYCHIATRIC: The patient has a normal affect.  DATA:   Aortoiliac duplex today with less than 50% stenosis of the aorta and bilateral iliac arteries by duplex and study suggest prior stenosis was overestimated  ABIs today are 0.59 on the right and 1.01 on the left - stable  Carotid duplex shows 1 to 39% stenosis bilaterally in the ICA  Assessment/Plan:   73 y.o. male well-known to our practice that presents for 6 month follow-up for interval surveillance of his PAD and carotid artery disease.  He has extensive history including left external iliac stenting for claudication on 06/18/2018.  Later he had left SFA stenting for claudication on 12/02/2019.  Ultimately he then had left common femoral endarterectomy with profundoplasty and bovine patch as well as left common femoral to below-knee popliteal bypass with 6 mm PTFE on 06/05/2021.    His ABIs are preserved today.  No significant stenosis noted on the aortoiliac segment on duplex.  His bypass graft was not studied but he does have a palpable PT pulse with no symptoms in the left leg.  I would like to see him again in 6 months with left leg bypass  duplex and ABIs since this was not studied today.  Discussed his carotid duplex looks great with minimal 1 to 39% stenosis bilaterally.  No indication for surgical intervention and the right carotid endarterectomy site is widely patent.  This can be studied again in 1 year as we discussed.  He is on aspirin  statin Plavix  for risk reduction   Lonni DOROTHA Gaskins, MD [facility] Office: [phone]

## 2024-01-15 ENCOUNTER — Other Ambulatory Visit: Payer: Self-pay

## 2024-01-15 DIAGNOSIS — R109 Unspecified abdominal pain: Secondary | ICD-10-CM

## 2024-01-15 DIAGNOSIS — I350 Nonrheumatic aortic (valve) stenosis: Secondary | ICD-10-CM

## 2024-01-15 DIAGNOSIS — I739 Peripheral vascular disease, unspecified: Secondary | ICD-10-CM

## 2024-01-26 DIAGNOSIS — E782 Mixed hyperlipidemia: Secondary | ICD-10-CM | POA: Diagnosis not present

## 2024-01-26 DIAGNOSIS — I25118 Atherosclerotic heart disease of native coronary artery with other forms of angina pectoris: Secondary | ICD-10-CM | POA: Diagnosis not present

## 2024-01-26 DIAGNOSIS — I10 Essential (primary) hypertension: Secondary | ICD-10-CM | POA: Diagnosis not present

## 2024-01-26 DIAGNOSIS — E119 Type 2 diabetes mellitus without complications: Secondary | ICD-10-CM | POA: Diagnosis not present

## 2024-02-16 ENCOUNTER — Other Ambulatory Visit: Payer: Self-pay | Admitting: Neurology

## 2024-02-17 ENCOUNTER — Other Ambulatory Visit: Payer: Self-pay

## 2024-02-17 DIAGNOSIS — Z952 Presence of prosthetic heart valve: Secondary | ICD-10-CM

## 2024-02-17 MED ORDER — ROSUVASTATIN CALCIUM 40 MG PO TABS: 40.0000 mg | ORAL_TABLET | Freq: Every day | ORAL | 3 refills | Status: AC

## 2024-02-18 NOTE — Progress Notes (Unsigned)
 [facility] CONSULT NOTE  Patient Care Team: Luis Cole, MD as PCP - General (Family Medicine) Luis Lonni BIRCH, MD as PCP - Cardiology (Cardiology)  ASSESSMENT & PLAN:  Luis Lindsey is a 73 y.o. 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  History revealed iron  deficiency, low B12.  Iron  deficiency anemia while having normal MCV.  Recommend also checking B12, folate and MMA.  The mechanism of IDA is due to either blood loss or decreased absorptive mechanism or both. We discussed some of the risks, benefits, and alternatives of intravenous iron  infusions. The patient is symptomatic from anemia and the iron  level is critically low. He tolerated oral iron  supplement poorly and desires to achieved higher levels of iron  faster for adequate hematopoesis. Some of the side-effects to be expected including risks of infusion reactions, phlebitis, headaches, nausea and fatigue.  The patient is willing to proceed. Patient education material was dispensed.   Ordering iv iron  to be given at [address]  IV iron  *** ordered. CBC, Iron , TIBC, ferritin Recommend repeat EGD evaluation worsening hernia  Assessment & Plan   No orders of the defined types were placed in this encounter.   Relevant history: History of *** Last colonoscopy: Last EGD: Periods:    All questions were answered. The patient knows to call the clinic with any problems, questions or concerns.     Luis Luis Chihuahua, MD 9/23/20255:36 PM   CHIEF COMPLAINTS/PURPOSE OF CONSULTATION:  Anemia  HISTORY OF PRESENTING ILLNESS:  Luis Lindsey 73 y.o. male is here because of anemia.  Luis Lindsey had not noticed any recent bleeding such as melena, hematuria or hematochezia  Patient was referred from Dr. Estelita Lindsey at [place].  Records  report history of hiatal hernia, iron  deficiency.  Hemoglobin was down to 5.9 on 02/27/2022.  Did not receive transfusion while in the hospital.  More recent lab on several occasions show iron  deficiency anemia.  09/26/23 globin 9.8 MCV 83 RDW 18 ferritin 18.6 iron  saturation 20%.  Most recent lab August of 2025 showed normal creatinine, LFT, calcium , albumin , hemoglobin was 9.8.  MCV 88, RDW 19.  WBC 6.1 platelet 261.  Ferritin was 11 iron  saturation was 12%.  Capsule study dated 10/12/2022 reported unsuccessful PillCam study as large amount of retained fluid noted at the beginning until end of evaluation 8 hours.  EGD dated 01/14/2020 showed small hiatal hernia, normal esophagus and duodenum.  His last colonoscopy was in September 2021 and showed tubular adenoma, hyperplastic polyp. ***He had no prior history or diagnosis of cancer. His age appropriate screening programs are up-to-date. ***He denies any pica and eats a variety of diet. ***He denies blood donation or received blood transfusion ***The patient was prescribed oral iron  supplements and he takes ***  MEDICAL HISTORY:  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   Dysphagia    Esophageal dysmotility    GERD (gastroesophageal reflux disease)    Heart murmur    pt had recent echocardiogram   History of hiatal hernia    History of Holter monitoring 2009   History of kidney  stones    HNP (herniated nucleus pulposus), lumbar    L4-5   HYPERCHOLESTEROLEMIA 11/30/2009   Hyperlipidemia    HYPERTENSION 11/30/2009   Kidney stones    Numbness and tingling    Peripheral vascular disease    Right carotid bruit    SMOKER 11/30/2009   Stroke (HCC) 2012   URINARY CALCULUS 11/30/2009    SURGICAL HISTORY: Past Surgical History:  Procedure Laterality  Date   ABDOMINAL AORTOGRAM W/LOWER EXTREMITY N/A 06/18/2018   Procedure: ABDOMINAL AORTOGRAM W/LOWER EXTREMITY;  Surgeon: Luis Lonni PARAS, MD;  Location: [facility];  Service: Cardiovascular;  Laterality: N/A;   ABDOMINAL AORTOGRAM W/LOWER EXTREMITY N/A 12/02/2019   Procedure: ABDOMINAL AORTOGRAM W/LOWER EXTREMITY;  Surgeon: Luis Lonni PARAS, MD;  Location: [facility];  Service: Cardiovascular;  Laterality: N/A;   ABDOMINAL AORTOGRAM W/LOWER EXTREMITY N/A 03/23/2021   Procedure: ABDOMINAL AORTOGRAM W/LOWER EXTREMITY;  Surgeon: Luis Lonni PARAS, MD;  Location: [facility];  Service: Cardiovascular;  Laterality: N/A;   AORTIC VALVE REPLACEMENT N/A 10/25/2021   Procedure: AORTIC VALVE REPLACEMENT (AVR)USING INSPIRIS RESILIA  AORTIC VALVE;  Surgeon: Luis Linnie KIDD, MD;  Location: [facility] OR;  Service: Open Heart Surgery;  Laterality: N/A;   arthroscopy of right shoulder Right    Jan 2019, June 2019-- done at [facility], Luis Lindsey   BACK SURGERY     CATARACT EXTRACTION W/ INTRAOCULAR LENS  IMPLANT, BILATERAL     CORONARY ARTERY BYPASS GRAFT N/A 10/25/2021   Procedure: CORONARY ARTERY BYPASS GRAFTING (CABG) X 3 USING LEFT INTERNAL MAMMARY ARTERY AND RIGHT GREATER SAPHENOUS VEIN;  Surgeon: Luis Linnie KIDD, MD;  Location: [facility] OR;  Service: Open Heart Surgery;  Laterality: N/A;   CYSTOSCOPY     ENDARTERECTOMY Right 08/24/2019   Procedure: ENDARTERECTOMY CAROTID;  Surgeon: Luis Lonni PARAS, MD;  Location: [facility] OR;  Service: Vascular;  Laterality: Right;   ENDARTERECTOMY FEMORAL Left 06/05/2021   Procedure: ENDARTERECTOMY FEMORAL WITH PROFUNDAPLASTY;  Surgeon: Luis Lonni PARAS, MD;  Location: [facility] OR;  Service: Vascular;  Laterality: Left;   ENDOVEIN HARVEST OF GREATER SAPHENOUS VEIN Right 10/25/2021   Procedure: ENDOVEIN HARVEST OF GREATER SAPHENOUS VEIN;  Surgeon: Luis Linnie KIDD, MD;  Location: [facility] OR;  Service: Open Heart Surgery;   Laterality: Right;   FACIAL COSMETIC SURGERY  2014   FEMORAL-POPLITEAL BYPASS GRAFT Left 06/05/2021   Procedure: LEFT FEMORAL-POPLITEAL ARTERY BYPASS GRAFTING USING 6mm PROPATEN VASCULAR REMOVABLE RING GRAFT;  Surgeon: Luis Lonni PARAS, MD;  Location: [facility] OR;  Service: Vascular;  Laterality: Left;   FINGER SURGERY  2002   INGUINAL HERNIA REPAIR Left 02/01/2023   Procedure: LAPAROSCOPIC LEFT INGUINAL HERNIA REPAIR WITH MESH;  Surgeon: Lyndel Deward PARAS, MD;  Location: [facility] ORS;  Service: General;  Laterality: Left;  90   LEFT HEART CATH AND CORONARY ANGIOGRAPHY N/A 10/05/2021   Procedure: LEFT HEART CATH AND CORONARY ANGIOGRAPHY;  Surgeon: Mady Lonni, MD;  [facility]: [facility];  Service: Cardiovascular;  Laterality: N/A;   LUMBAR LAMINECTOMY/DECOMPRESSION MICRODISCECTOMY Left 08/01/2018   Procedure: Microdiscectomy - Lumbar four-Lumbar five - left;  Surgeon: Joshua Alm RAMAN, MD;  Location: [facility] OR;  Service: Neurosurgery;  Laterality: Left;   LUMBAR LAMINECTOMY/DECOMPRESSION MICRODISCECTOMY Left 10/15/2018   Procedure: Re-do Microdiscectomy - left - Lumbar four-Lumbar five;  Surgeon: Joshua Alm RAMAN, MD;  Location: [facility] OR;  Service: Neurosurgery;  Laterality: Left;   NECK SURGERY  1986   PATCH ANGIOPLASTY Left 06/05/2021   Procedure:  PATCH ANGIOPLASTY USING XENOSURE BIOLOGIC PATCH;  Surgeon: Luis Lonni PARAS, MD;  Location: [facility] OR;  Service: Vascular;  Laterality: Left;   PERIPHERAL VASCULAR ATHERECTOMY  12/02/2019   Procedure: PERIPHERAL VASCULAR ATHERECTOMY;  Surgeon: Luis Lonni PARAS, MD;  Location: [facility] INVASIVE CV LAB;  Service: Cardiovascular;;  left SFA   PERIPHERAL VASCULAR INTERVENTION  06/18/2018   Procedure: PERIPHERAL VASCULAR INTERVENTION;  Surgeon: Luis Lonni PARAS, MD;  [facility]: [facility];  Service: Cardiovascular;;  Left external iliac   ROTATOR CUFF REPAIR Left    TEE WITHOUT CARDIOVERSION N/A 10/25/2021   Procedure: TRANSESOPHAGEAL ECHOCARDIOGRAM (TEE);   Surgeon: Luis Linnie KIDD, MD;  Location: [facility] OR;  Service: Open Heart Surgery;  Laterality: N/A;   UMBILICAL HERNIA REPAIR      SOCIAL HISTORY: Social History   Socioeconomic History   Marital status: Married    Spouse name: [name]    Number of children: 3   Years of education: Not on file   Highest education level: GED or equivalent  Occupational History   Occupation: Truck Air traffic controller: [employer]  Tobacco Use   Smoking status: Former    Current packs/day: 0.00    Types: Cigarettes    Start date: 12/1975    Quit date: 12/2015    Years since quitting: 8.1    Passive exposure: Current (Wife Is a smoker)   Smokeless tobacco: Never   Tobacco comments:    Quit 12-2012  Vaping Use   Vaping status: Never Used  Substance and Sexual Activity   Alcohol use: No   Drug use: No   Sexual activity: Yes    Birth control/protection: None  Other Topics Concern   Not on file  Social History Narrative   . Patient drinks 2-3 cups of caffeine daily.    Patient lives at home with his wife [name].    Patient works at [employer].    Education. 12 th grade   Right handed               Social Drivers of Corporate investment banker Strain: Not on file  Food Insecurity: No Food Insecurity (02/28/2022)   Hunger Vital Sign    Worried About Running Out of Food in the Last Year: Never true    Ran Out of Food in the Last Year: Never true  Transportation Needs: No Transportation Needs (02/28/2022)   PRAPARE - Administrator, Civil Service (Medical): No    Lack of Transportation (Non-Medical): No  Physical Activity: Not on file  Stress: Not on file  Social Connections: Not on file  Intimate Partner Violence: Not At Risk (02/28/2022)   Humiliation, Afraid, Rape, and Kick questionnaire    Fear of Current or Ex-Partner: No    Emotionally Abused: No    Physically Abused: No    Sexually Abused: No    FAMILY HISTORY: Family History  Problem Relation Age of Onset   Diabetes  Sister    Diabetes Mellitus I Sister    Stroke Father    Heart disease Father    CVA Father    Coronary artery disease Father    Diabetes Mother    Heart disease Mother    Diabetes Mellitus I Mother    Coronary artery disease Mother    Coronary artery disease Brother    Heart disease Brother    Coronary artery disease Brother     ALLERGIES:  is allergic to codeine.  MEDICATIONS:  Current Outpatient  Medications  Medication Sig Dispense Refill   acetaminophen  (TYLENOL ) 500 MG tablet Take 1,000 mg by mouth every 6 (six) hours as needed for mild pain.     ALPRAZolam  (XANAX ) 0.5 MG tablet Take 1-2 tablets 30 minutes prior to MRI, may repeat once as needed. Must have driver. 3 tablet 0   amLODipine  (NORVASC ) 10 MG tablet Take 1 tablet (10 mg total) by mouth daily. 90 tablet 3   aspirin  EC 81 MG EC tablet Take 1 tablet (81 mg total) by mouth daily at 6 (six) AM.     benazepril  (LOTENSIN ) 20 MG tablet Take 1 tablet (20 mg total) by mouth daily. (Patient taking differently: Take 20 mg by mouth in the morning and at bedtime.) 90 tablet 2   clopidogrel  (PLAVIX ) 75 MG tablet TAKE 1 TABLET BY MOUTH EVERY DAY 90 tablet 3   ezetimibe  (ZETIA ) 10 MG tablet Take 1 tablet (10 mg total) by mouth daily. 90 tablet 2   ferrous sulfate  325 (65 FE) MG tablet Take 325 mg by mouth daily.     gabapentin  (NEURONTIN ) 100 MG capsule Take 100-200 mg by mouth See admin instructions. Take 100 mg by mouth in the morning and 200 mg at bedtime     insulin  degludec (TRESIBA ) 200 UNIT/ML FlexTouch Pen Inject 34 Units into the skin in the morning. (Patient taking differently: Inject 16 Units into the skin in the morning.)     iron  polysaccharides (NIFEREX) 150 MG capsule Take 1 capsule (150 mg total) by mouth daily. 30 capsule 0   metFORMIN  (GLUCOPHAGE -XR) 500 MG 24 hr tablet Take 2 tablets (1,000 mg total) by mouth 2 (two) times daily.     NOVOLOG  FLEXPEN 100 UNIT/ML FlexPen INJECT UP TO 10 UNITS BEFORE MEALS SUBCUTANEOUS  THREE TIMES DAILY 30 DAYS     ONETOUCH VERIO test strip 1 each 3 (three) times daily.     pantoprazole  (PROTONIX ) 40 MG tablet Take 40 mg by mouth daily with breakfast.      pioglitazone  (ACTOS ) 45 MG tablet TAKE 1 TABLET DAILY (NEED APPOINTMENT FOR FURTHER REFILLS) 90 tablet 0   polyethylene glycol (MIRALAX  / GLYCOLAX ) 17 g packet Take 17 g by mouth daily as needed for moderate constipation.     propranolol  (INDERAL ) 20 MG tablet TAKE 1 TABLET BY MOUTH TWICE A DAY 180 tablet 2   rosuvastatin  (CRESTOR ) 40 MG tablet Take 1 tablet (40 mg total) by mouth daily. 90 tablet 3   No current facility-administered medications for this visit.    REVIEW OF SYSTEMS:   All relevant systems were reviewed with the patient and are negative.  PHYSICAL EXAMINATION: ECOG PERFORMANCE STATUS: {CHL ONC ECOG PS:[phone]}  There were no vitals filed for this visit. There were no vitals filed for this visit.  GENERAL: alert, no distress and comfortable SKIN: skin color normal EYES: normal conjunctiva, sclera clear LUNGS: normal breathing effort HEART: regular rate & rhythm ABDOMEN: abdomen soft, non-tender and nondistended  RADIOGRAPHIC STUDIES: I have personally reviewed the radiological images as listed and agreed with the findings in the report. No results found.

## 2024-02-20 ENCOUNTER — Telehealth: Payer: Self-pay

## 2024-02-20 ENCOUNTER — Inpatient Hospital Stay

## 2024-02-20 ENCOUNTER — Telehealth: Payer: Self-pay | Admitting: Pharmacy Technician

## 2024-02-20 VITALS — BP 133/52 | HR 74 | Temp 97.2°F | Resp 14 | Wt 157.0 lb

## 2024-02-20 DIAGNOSIS — D509 Iron deficiency anemia, unspecified: Secondary | ICD-10-CM | POA: Diagnosis not present

## 2024-02-20 DIAGNOSIS — E538 Deficiency of other specified B group vitamins: Secondary | ICD-10-CM | POA: Diagnosis not present

## 2024-02-20 DIAGNOSIS — E1142 Type 2 diabetes mellitus with diabetic polyneuropathy: Secondary | ICD-10-CM | POA: Insufficient documentation

## 2024-02-20 DIAGNOSIS — D649 Anemia, unspecified: Secondary | ICD-10-CM

## 2024-02-20 DIAGNOSIS — D508 Other iron deficiency anemias: Secondary | ICD-10-CM

## 2024-02-20 NOTE — Telephone Encounter (Signed)
 Dr. Tina, patient will be scheduled as soon as possible.  Auth Submission: NO AUTH NEEDED Site of care: Site of care: [facility] Payer: [employer] medicare Medication & CPT/J Code(s) submitted: Feraheme (ferumoxytol) [id number] Diagnosis Code:  Route of submission (phone, fax, portal):  Phone # Fax # Auth type: Buy/Bill PB Units/visits requested: 510mg  x 2 doses Reference number:  Approval from: 02/20/24 to 05/27/24

## 2024-02-20 NOTE — Telephone Encounter (Signed)
 Scheduled patient appointments. Called and spoke with the patient, he is aware.

## 2024-02-20 NOTE — Telephone Encounter (Signed)
 Auth Submission: NO AUTH NEEDED Site of care: Site of care: [facility] Payer: [employer] MEDICARE Medication & CPT/J Code(s) submitted: Feraheme (ferumoxytol) [id number] Diagnosis Code: D509 Route of submission (phone, fax, portal):  Phone # Fax # Auth type: Buy/Bill PB Units/visits requested: X2 DOSES Reference number: [id number] Approval from: 02/20/24 to 05/27/24

## 2024-02-20 NOTE — Assessment & Plan Note (Addendum)
 IV iron  feraheme ordered. CBC, Iron , TIBC, ferritin in January about 2 weeks before next visit Recommend repeat EGD evaluation bleeding and worsening hernia

## 2024-02-25 DIAGNOSIS — D509 Iron deficiency anemia, unspecified: Secondary | ICD-10-CM | POA: Diagnosis not present

## 2024-02-25 DIAGNOSIS — R809 Proteinuria, unspecified: Secondary | ICD-10-CM | POA: Diagnosis not present

## 2024-02-25 DIAGNOSIS — N2581 Secondary hyperparathyroidism of renal origin: Secondary | ICD-10-CM | POA: Diagnosis not present

## 2024-02-25 DIAGNOSIS — I25118 Atherosclerotic heart disease of native coronary artery with other forms of angina pectoris: Secondary | ICD-10-CM | POA: Diagnosis not present

## 2024-02-25 DIAGNOSIS — I129 Hypertensive chronic kidney disease with stage 1 through stage 4 chronic kidney disease, or unspecified chronic kidney disease: Secondary | ICD-10-CM | POA: Diagnosis not present

## 2024-02-25 DIAGNOSIS — I10 Essential (primary) hypertension: Secondary | ICD-10-CM | POA: Diagnosis not present

## 2024-02-25 DIAGNOSIS — E119 Type 2 diabetes mellitus without complications: Secondary | ICD-10-CM | POA: Diagnosis not present

## 2024-02-25 DIAGNOSIS — E782 Mixed hyperlipidemia: Secondary | ICD-10-CM | POA: Diagnosis not present

## 2024-02-27 ENCOUNTER — Ambulatory Visit (INDEPENDENT_AMBULATORY_CARE_PROVIDER_SITE_OTHER)

## 2024-02-27 ENCOUNTER — Other Ambulatory Visit: Payer: Self-pay | Admitting: Nephrology

## 2024-02-27 VITALS — BP 126/65 | HR 62 | Temp 97.9°F | Resp 16 | Ht 70.0 in | Wt 157.2 lb

## 2024-02-27 DIAGNOSIS — D509 Iron deficiency anemia, unspecified: Secondary | ICD-10-CM | POA: Diagnosis not present

## 2024-02-27 DIAGNOSIS — R809 Proteinuria, unspecified: Secondary | ICD-10-CM

## 2024-02-27 MED ORDER — SODIUM CHLORIDE 0.9 % IV SOLN
510.0000 mg | Freq: Once | INTRAVENOUS | Status: AC
  Administered 2024-02-27: 510 mg via INTRAVENOUS
  Filled 2024-02-27: qty 17

## 2024-02-27 NOTE — Progress Notes (Signed)
 Diagnosis: Iron  Deficiency Anemia  Provider:  Praveen Mannam MD  Procedure: IV Infusion  IV Type: Peripheral, IV Location: R Antecubital  Feraheme (Ferumoxytol), Dose: 510 mg  Infusion Start Time: 1329  Infusion Stop Time: 1345  Post Infusion IV Care: Observation period completed and Peripheral IV Discontinued  Discharge: Condition: Good, Destination: Home . AVS Provided  Performed by:  Maximiano JONELLE Pouch, LPN

## 2024-02-28 ENCOUNTER — Ambulatory Visit
Admission: RE | Admit: 2024-02-28 | Discharge: 2024-02-28 | Disposition: A | Discharge status: Home / Self Care | Source: Ambulatory Visit | Attending: Nephrology | Admitting: Nephrology

## 2024-02-28 DIAGNOSIS — R809 Proteinuria, unspecified: Secondary | ICD-10-CM | POA: Diagnosis not present

## 2024-03-05 ENCOUNTER — Ambulatory Visit: Admitting: *Deleted

## 2024-03-05 VITALS — BP 123/62 | HR 51 | Temp 97.3°F | Resp 12 | Ht 69.0 in | Wt 156.8 lb

## 2024-03-05 DIAGNOSIS — D509 Iron deficiency anemia, unspecified: Secondary | ICD-10-CM | POA: Diagnosis not present

## 2024-03-05 MED ORDER — SODIUM CHLORIDE 0.9 % IV SOLN
510.0000 mg | Freq: Once | INTRAVENOUS | Status: AC
  Administered 2024-03-05: 510 mg via INTRAVENOUS
  Filled 2024-03-05: qty 17

## 2024-03-05 NOTE — Progress Notes (Signed)
 Diagnosis: Iron  Deficiency Anemia  Provider:  Mannam, Praveen MD  Procedure: IV Infusion  IV Type: Peripheral, IV Location: L Forearm  Feraheme (Ferumoxytol), Dose: 510 mg  Infusion Start Time: 1341 pm  Infusion Stop Time: 1406 pm  Post Infusion IV Care: Observation period completed and Peripheral IV Discontinued  Discharge: Condition: Good, Destination: Home . AVS Declined  Performed by:  Trudy Lamarr LABOR, RN

## 2024-03-16 ENCOUNTER — Other Ambulatory Visit: Payer: Self-pay | Admitting: Nurse Practitioner

## 2024-03-16 DIAGNOSIS — I10 Essential (primary) hypertension: Secondary | ICD-10-CM

## 2024-03-16 DIAGNOSIS — I25118 Atherosclerotic heart disease of native coronary artery with other forms of angina pectoris: Secondary | ICD-10-CM

## 2024-03-16 DIAGNOSIS — Z952 Presence of prosthetic heart valve: Secondary | ICD-10-CM

## 2024-03-16 DIAGNOSIS — I77819 Aortic ectasia, unspecified site: Secondary | ICD-10-CM

## 2024-03-16 DIAGNOSIS — I498 Other specified cardiac arrhythmias: Secondary | ICD-10-CM

## 2024-03-16 DIAGNOSIS — I739 Peripheral vascular disease, unspecified: Secondary | ICD-10-CM

## 2024-03-26 DIAGNOSIS — R809 Proteinuria, unspecified: Secondary | ICD-10-CM | POA: Diagnosis not present

## 2024-03-26 DIAGNOSIS — D649 Anemia, unspecified: Secondary | ICD-10-CM | POA: Diagnosis not present

## 2024-03-26 DIAGNOSIS — E1165 Type 2 diabetes mellitus with hyperglycemia: Secondary | ICD-10-CM | POA: Diagnosis not present

## 2024-03-26 DIAGNOSIS — D509 Iron deficiency anemia, unspecified: Secondary | ICD-10-CM | POA: Diagnosis not present

## 2024-03-26 DIAGNOSIS — E782 Mixed hyperlipidemia: Secondary | ICD-10-CM | POA: Diagnosis not present

## 2024-04-06 ENCOUNTER — Ambulatory Visit: Admitting: Neurology

## 2024-04-06 ENCOUNTER — Encounter: Payer: Self-pay | Admitting: Neurology

## 2024-06-04 ENCOUNTER — Inpatient Hospital Stay

## 2024-06-04 DIAGNOSIS — D649 Anemia, unspecified: Secondary | ICD-10-CM

## 2024-06-04 DIAGNOSIS — D508 Other iron deficiency anemias: Secondary | ICD-10-CM

## 2024-06-04 LAB — CBC WITH DIFFERENTIAL (CANCER CENTER ONLY)
Abs Immature Granulocytes: 0.06 K/uL (ref 0.00–0.07)
Basophils Absolute: 0 K/uL (ref 0.0–0.1)
Basophils Relative: 0 %
Eosinophils Absolute: 0 K/uL (ref 0.0–0.5)
Eosinophils Relative: 0 %
HCT: 31.7 % — ABNORMAL LOW (ref 39.0–52.0)
Hemoglobin: 10.2 g/dL — ABNORMAL LOW (ref 13.0–17.0)
Immature Granulocytes: 1 %
Lymphocytes Relative: 15 %
Lymphs Abs: 1.3 K/uL (ref 0.7–4.0)
MCH: 30.6 pg (ref 26.0–34.0)
MCHC: 32.2 g/dL (ref 30.0–36.0)
MCV: 95.2 fL (ref 80.0–100.0)
Monocytes Absolute: 0.7 K/uL (ref 0.1–1.0)
Monocytes Relative: 9 %
Neutro Abs: 6.5 K/uL (ref 1.7–7.7)
Neutrophils Relative %: 75 %
Platelet Count: 305 K/uL (ref 150–400)
RBC: 3.33 MIL/uL — ABNORMAL LOW (ref 4.22–5.81)
RDW: 16 % — ABNORMAL HIGH (ref 11.5–15.5)
WBC Count: 8.6 K/uL (ref 4.0–10.5)
nRBC: 0 % (ref 0.0–0.2)

## 2024-06-04 LAB — IRON AND IRON BINDING CAPACITY (CC-WL,HP ONLY)
Iron: 57 ug/dL (ref 45–182)
Saturation Ratios: 14 % — ABNORMAL LOW (ref 17.9–39.5)
TIBC: 412 ug/dL (ref 250–450)
UIBC: 355 ug/dL

## 2024-06-04 LAB — FERRITIN: Ferritin: 47 ng/mL (ref 24–336)

## 2024-06-04 LAB — FOLATE: Folate: 11.9 ng/mL

## 2024-06-04 LAB — VITAMIN B12: Vitamin B-12: 2498 pg/mL — ABNORMAL HIGH (ref 180–914)

## 2024-06-07 LAB — METHYLMALONIC ACID, SERUM: Methylmalonic Acid, Quantitative: 307 nmol/L (ref 0–378)

## 2024-06-07 NOTE — Progress Notes (Unsigned)
 [facility] OFFICE PROGRESS NOTE  Patient Care Team: Leonel Cole, MD as PCP - General (Family Medicine) Verlin Lonni BIRCH, MD as PCP - Cardiology (Cardiology)  Luis Lindsey is a 74 y.o. 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   History revealed iron  deficiency, low B12.  Iron  deficiency anemia while having normal MCV.  Recommend also checking B12, folate and MMA.   Relevant history including ongoing Plavix  and aspirin  usage, history of hiatal hernia.  Received IV feraheme  in Oct. Assessment & Plan   No orders of the defined types were placed in this encounter.    Luis JAYSON Chihuahua, MD  INTERVAL HISTORY: Patient returns for follow-up.  Oncology History   No problem history exists.     PHYSICAL EXAMINATION: ECOG PERFORMANCE STATUS: {CHL ONC ECOG [phone]}  There were no vitals filed for this visit. There were no vitals filed for this visit.  GENERAL: alert, no distress and comfortable SKIN: skin color normal and no jaundice or bruising or petechiae on exposed skin EYES: normal, sclera clear OROPHARYNX: no exudate  NECK: No palpable mass LYMPH:  no palpable cervical, axillary lymphadenopathy  LUNGS: clear to auscultation and no wheeze or rales with normal breathing effort HEART: regular rate & rhythm  ABDOMEN: abdomen soft, non-tender and nondistended. Musculoskeletal: no edema NEURO: no focal motor/sensory deficits  Relevant data reviewed during this visit included labs.  New labs ordered.

## 2024-06-08 ENCOUNTER — Inpatient Hospital Stay

## 2024-06-08 ENCOUNTER — Other Ambulatory Visit (HOSPITAL_COMMUNITY): Payer: Self-pay

## 2024-06-08 ENCOUNTER — Telehealth: Payer: Self-pay | Admitting: Pharmacy Technician

## 2024-06-08 VITALS — BP 120/54 | HR 62 | Temp 97.5°F | Resp 18 | Wt 151.6 lb

## 2024-06-08 DIAGNOSIS — D508 Other iron deficiency anemias: Secondary | ICD-10-CM

## 2024-06-08 DIAGNOSIS — Z7902 Long term (current) use of antithrombotics/antiplatelets: Secondary | ICD-10-CM | POA: Diagnosis not present

## 2024-06-08 DIAGNOSIS — D649 Anemia, unspecified: Secondary | ICD-10-CM | POA: Diagnosis not present

## 2024-06-08 NOTE — Assessment & Plan Note (Addendum)
 IV iron  feraheme  ordered. CBC, Iron , TIBC, ferritin in April about 2 weeks before next visit Recommend repeat EGD evaluation bleeding and worsening hernia Continue protonix  while on DAPT

## 2024-06-08 NOTE — Assessment & Plan Note (Addendum)
 Continue protonix  while on DAPT

## 2024-06-08 NOTE — Telephone Encounter (Signed)
 Auth Submission: NO AUTH NEEDED Site of care: Site of care: [facility] Payer: [employer] MEDICARE Medication & CPT/J Code(s) submitted: Feraheme  (ferumoxytol ) [id number] Diagnosis Code: D50.9 Route of submission (phone, fax, portal):  Phone # Fax # Auth type: Buy/Bill PB Units/visits requested: 2 DOSES Reference number: [id number] Approval from: 06/08/24 to 09/24/24

## 2024-06-09 ENCOUNTER — Ambulatory Visit

## 2024-06-09 VITALS — BP 100/51 | HR 60 | Temp 97.4°F | Resp 20 | Ht 69.0 in | Wt 152.2 lb

## 2024-06-09 DIAGNOSIS — D509 Iron deficiency anemia, unspecified: Secondary | ICD-10-CM

## 2024-06-09 MED ORDER — SODIUM CHLORIDE 0.9 % IV SOLN
510.0000 mg | Freq: Once | INTRAVENOUS | Status: AC
  Administered 2024-06-09: 510 mg via INTRAVENOUS
  Filled 2024-06-09: qty 17

## 2024-06-09 NOTE — Progress Notes (Signed)
 Diagnosis: Iron  Deficiency Anemia  Provider:  Praveen Mannam MD  Procedure: IV Infusion  IV Type: Peripheral, IV Location: R Forearm  Feraheme  (Ferumoxytol ), Dose: 510 mg  Infusion Start Time: 1458  Infusion Stop Time: 1517  Post Infusion IV Care: Observation period completed and Peripheral IV Discontinued  Discharge: Condition: Good, Destination: Home . AVS Declined  Performed by:  Taim Wurm, RN

## 2024-06-16 ENCOUNTER — Ambulatory Visit

## 2024-06-16 VITALS — BP 121/60 | HR 68 | Temp 97.5°F | Resp 16 | Ht 70.0 in | Wt 147.2 lb

## 2024-06-16 DIAGNOSIS — D509 Iron deficiency anemia, unspecified: Secondary | ICD-10-CM

## 2024-06-16 MED ORDER — SODIUM CHLORIDE 0.9 % IV SOLN
510.0000 mg | Freq: Once | INTRAVENOUS | Status: AC
  Administered 2024-06-16: 510 mg via INTRAVENOUS
  Filled 2024-06-16: qty 17

## 2024-06-16 NOTE — Progress Notes (Signed)
 Diagnosis: Iron  Deficiency Anemia  Provider:  Praveen Mannam MD  Procedure: IV Infusion  IV Type: Peripheral, IV Location: R Wrist    Feraheme  (Ferumoxytol ), Dose: 510 mg  Infusion Start Time: 1430  Infusion Stop Time: 1449  Post Infusion IV Care: Observation period completed and Peripheral IV Discontinued. Pt requested only a 15 minute observation   Discharge: Condition: Good, Destination: Home . AVS Declined  Performed by:  Leita FORBES Miles, LPN

## 2024-06-26 ENCOUNTER — Telehealth: Payer: Self-pay

## 2024-06-26 NOTE — Telephone Encounter (Signed)
 Annual Lung CT scheduled.

## 2024-07-10 ENCOUNTER — Other Ambulatory Visit

## 2024-07-14 ENCOUNTER — Ambulatory Visit (HOSPITAL_COMMUNITY)

## 2024-07-14 ENCOUNTER — Ambulatory Visit: Admitting: Vascular Surgery

## 2024-07-23 ENCOUNTER — Ambulatory Visit: Admitting: Cardiovascular Disease

## 2024-09-04 ENCOUNTER — Inpatient Hospital Stay

## 2024-09-07 ENCOUNTER — Inpatient Hospital Stay
